# Patient Record
Sex: Female | Born: 1955
Health system: Southern US, Community
[De-identification: ages and names within clinical notes are randomized; demographics above are authoritative.]

## PROBLEM LIST (undated history)

## (undated) DIAGNOSIS — A6000 Herpesviral infection of urogenital system, unspecified: Secondary | ICD-10-CM

## (undated) DIAGNOSIS — F411 Generalized anxiety disorder: Secondary | ICD-10-CM

## (undated) DIAGNOSIS — F329 Major depressive disorder, single episode, unspecified: Secondary | ICD-10-CM

## (undated) DIAGNOSIS — M1990 Unspecified osteoarthritis, unspecified site: Secondary | ICD-10-CM

## (undated) DIAGNOSIS — K589 Irritable bowel syndrome without diarrhea: Secondary | ICD-10-CM

## (undated) DIAGNOSIS — E785 Hyperlipidemia, unspecified: Secondary | ICD-10-CM

## (undated) DIAGNOSIS — F1021 Alcohol dependence, in remission: Secondary | ICD-10-CM

## (undated) DIAGNOSIS — E7800 Pure hypercholesterolemia, unspecified: Secondary | ICD-10-CM

## (undated) DIAGNOSIS — F1110 Opioid abuse, uncomplicated: Secondary | ICD-10-CM

## (undated) DIAGNOSIS — B001 Herpesviral vesicular dermatitis: Secondary | ICD-10-CM

## (undated) DIAGNOSIS — F419 Anxiety disorder, unspecified: Secondary | ICD-10-CM

## (undated) DIAGNOSIS — N879 Dysplasia of cervix uteri, unspecified: Secondary | ICD-10-CM

## (undated) DIAGNOSIS — K6389 Other specified diseases of intestine: Secondary | ICD-10-CM

## (undated) DIAGNOSIS — I7300 Raynaud's syndrome without gangrene: Secondary | ICD-10-CM

## (undated) DIAGNOSIS — T40601A Poisoning by unspecified narcotics, accidental (unintentional), initial encounter: Secondary | ICD-10-CM

## (undated) DIAGNOSIS — M8580 Other specified disorders of bone density and structure, unspecified site: Secondary | ICD-10-CM

## (undated) DIAGNOSIS — G4730 Sleep apnea, unspecified: Secondary | ICD-10-CM

## (undated) HISTORY — PX: COSMETIC SURGERY: SHX468

## (undated) HISTORY — DX: Herpesviral infection of urogenital system, unspecified: A60.00

## (undated) HISTORY — DX: Dysplasia of cervix uteri, unspecified: N87.9

## (undated) HISTORY — DX: Other specified diseases of intestine: K63.89

## (undated) HISTORY — DX: Herpesviral vesicular dermatitis: B00.1

## (undated) HISTORY — DX: Irritable bowel syndrome, unspecified: K58.9

## (undated) HISTORY — DX: Other specified disorders of bone density and structure, unspecified site: M85.80

## (undated) HISTORY — DX: Generalized anxiety disorder: F41.1

## (undated) HISTORY — DX: Major depressive disorder, single episode, unspecified: F32.9

## (undated) HISTORY — DX: Anxiety disorder, unspecified: F41.9

## (undated) HISTORY — PX: TONSILLECTOMY AND ADENOIDECTOMY: SUR1326

## (undated) HISTORY — PX: EYE SURGERY: SHX253

## (undated) HISTORY — PX: TUBAL LIGATION: SHX77

## (undated) HISTORY — DX: Opioid abuse, uncomplicated: F11.10

## (undated) HISTORY — PX: AUGMENTATION MAMMAPLASTY: SUR837

## (undated) HISTORY — DX: Alcohol dependence, in remission: F10.21

## (undated) HISTORY — PX: OOPHORECTOMY: SHX86

## (undated) HISTORY — PX: CATARACT EXTRACTION: SUR2

## (undated) HISTORY — DX: Poisoning by unspecified narcotics, accidental (unintentional), initial encounter: T40.601A

## (undated) HISTORY — DX: Hyperlipidemia, unspecified: E78.5

## (undated) HISTORY — DX: Pure hypercholesterolemia, unspecified: E78.00

## (undated) HISTORY — DX: Raynaud's syndrome without gangrene: I73.00

---

## 1988-11-02 DIAGNOSIS — N879 Dysplasia of cervix uteri, unspecified: Secondary | ICD-10-CM

## 1988-11-02 HISTORY — PX: GYNECOLOGIC CRYOSURGERY: SHX857

## 1988-11-02 HISTORY — DX: Dysplasia of cervix uteri, unspecified: N87.9

## 1999-12-11 ENCOUNTER — Other Ambulatory Visit: Admission: RE | Admit: 1999-12-11 | Discharge: 1999-12-11 | Payer: Self-pay | Admitting: Gynecology

## 2001-08-01 ENCOUNTER — Other Ambulatory Visit: Admission: RE | Admit: 2001-08-01 | Discharge: 2001-08-01 | Payer: Self-pay | Admitting: Gynecology

## 2002-09-06 ENCOUNTER — Other Ambulatory Visit: Admission: RE | Admit: 2002-09-06 | Discharge: 2002-09-06 | Payer: Self-pay | Admitting: Gynecology

## 2003-06-28 ENCOUNTER — Ambulatory Visit (HOSPITAL_COMMUNITY): Admission: RE | Admit: 2003-06-28 | Discharge: 2003-06-28 | Payer: Self-pay | Admitting: Family Medicine

## 2003-06-28 ENCOUNTER — Encounter: Payer: Self-pay | Admitting: Family Medicine

## 2003-10-30 ENCOUNTER — Other Ambulatory Visit: Admission: RE | Admit: 2003-10-30 | Discharge: 2003-10-30 | Payer: Self-pay | Admitting: Gynecology

## 2003-11-03 HISTORY — PX: VAGINAL HYSTERECTOMY: SUR661

## 2004-01-07 ENCOUNTER — Encounter (INDEPENDENT_AMBULATORY_CARE_PROVIDER_SITE_OTHER): Payer: Self-pay | Admitting: Specialist

## 2004-01-08 ENCOUNTER — Inpatient Hospital Stay (HOSPITAL_COMMUNITY): Admission: RE | Admit: 2004-01-08 | Discharge: 2004-01-09 | Payer: Self-pay | Admitting: Gynecology

## 2004-06-24 ENCOUNTER — Ambulatory Visit (HOSPITAL_COMMUNITY): Admission: RE | Admit: 2004-06-24 | Discharge: 2004-06-24 | Payer: Self-pay | Admitting: Family Medicine

## 2004-10-31 ENCOUNTER — Other Ambulatory Visit: Admission: RE | Admit: 2004-10-31 | Discharge: 2004-10-31 | Payer: Self-pay | Admitting: Gynecology

## 2004-11-02 HISTORY — PX: BREAST ENHANCEMENT SURGERY: SHX7

## 2005-04-07 ENCOUNTER — Ambulatory Visit: Payer: Self-pay | Admitting: Gastroenterology

## 2005-04-10 ENCOUNTER — Ambulatory Visit: Payer: Self-pay | Admitting: Gastroenterology

## 2005-04-24 ENCOUNTER — Ambulatory Visit: Payer: Self-pay | Admitting: Gastroenterology

## 2005-04-24 ENCOUNTER — Encounter (INDEPENDENT_AMBULATORY_CARE_PROVIDER_SITE_OTHER): Payer: Self-pay | Admitting: *Deleted

## 2005-04-24 HISTORY — PX: UPPER GASTROINTESTINAL ENDOSCOPY: SHX188

## 2005-11-09 ENCOUNTER — Other Ambulatory Visit: Admission: RE | Admit: 2005-11-09 | Discharge: 2005-11-09 | Payer: Self-pay | Admitting: Gynecology

## 2006-11-22 ENCOUNTER — Other Ambulatory Visit: Admission: RE | Admit: 2006-11-22 | Discharge: 2006-11-22 | Payer: Self-pay | Admitting: Gynecology

## 2007-04-25 ENCOUNTER — Ambulatory Visit: Payer: Self-pay | Admitting: Family Medicine

## 2007-05-13 ENCOUNTER — Ambulatory Visit: Payer: Self-pay | Admitting: Family Medicine

## 2007-11-08 ENCOUNTER — Ambulatory Visit: Payer: Self-pay | Admitting: Family Medicine

## 2007-12-07 ENCOUNTER — Other Ambulatory Visit: Admission: RE | Admit: 2007-12-07 | Discharge: 2007-12-07 | Payer: Self-pay | Admitting: Gynecology

## 2008-04-27 ENCOUNTER — Ambulatory Visit: Payer: Self-pay | Admitting: Family Medicine

## 2008-11-20 ENCOUNTER — Encounter: Admission: RE | Admit: 2008-11-20 | Discharge: 2008-11-20 | Payer: Self-pay | Admitting: Family Medicine

## 2008-11-20 ENCOUNTER — Ambulatory Visit: Payer: Self-pay | Admitting: Family Medicine

## 2008-11-28 ENCOUNTER — Ambulatory Visit: Payer: Self-pay | Admitting: Family Medicine

## 2008-12-13 ENCOUNTER — Other Ambulatory Visit: Admission: RE | Admit: 2008-12-13 | Discharge: 2008-12-13 | Payer: Self-pay | Admitting: Gynecology

## 2008-12-13 ENCOUNTER — Ambulatory Visit: Payer: Self-pay | Admitting: Gynecology

## 2008-12-13 ENCOUNTER — Encounter: Payer: Self-pay | Admitting: Gynecology

## 2009-05-29 ENCOUNTER — Ambulatory Visit: Payer: Self-pay | Admitting: Family Medicine

## 2009-12-18 ENCOUNTER — Ambulatory Visit: Payer: Self-pay | Admitting: Gynecology

## 2009-12-18 ENCOUNTER — Other Ambulatory Visit: Admission: RE | Admit: 2009-12-18 | Discharge: 2009-12-18 | Payer: Self-pay | Admitting: Gynecology

## 2010-02-26 ENCOUNTER — Ambulatory Visit: Payer: Self-pay | Admitting: Gynecology

## 2010-05-16 ENCOUNTER — Ambulatory Visit: Payer: Self-pay | Admitting: Family Medicine

## 2010-07-02 ENCOUNTER — Ambulatory Visit: Payer: Self-pay | Admitting: Family Medicine

## 2010-12-22 ENCOUNTER — Other Ambulatory Visit (HOSPITAL_COMMUNITY)
Admission: RE | Admit: 2010-12-22 | Discharge: 2010-12-22 | Disposition: A | Discharge status: Home / Self Care | Payer: 59 | Source: Ambulatory Visit | Attending: Gynecology | Admitting: Gynecology

## 2010-12-22 DIAGNOSIS — Z124 Encounter for screening for malignant neoplasm of cervix: Secondary | ICD-10-CM | POA: Insufficient documentation

## 2010-12-24 ENCOUNTER — Other Ambulatory Visit: Payer: Self-pay | Admitting: Gynecology

## 2010-12-24 ENCOUNTER — Encounter (INDEPENDENT_AMBULATORY_CARE_PROVIDER_SITE_OTHER): Payer: 59 | Admitting: Gynecology

## 2010-12-24 DIAGNOSIS — R809 Proteinuria, unspecified: Secondary | ICD-10-CM

## 2010-12-24 DIAGNOSIS — Z01419 Encounter for gynecological examination (general) (routine) without abnormal findings: Secondary | ICD-10-CM

## 2011-01-01 ENCOUNTER — Other Ambulatory Visit: Payer: Self-pay | Admitting: Dermatology

## 2011-02-19 ENCOUNTER — Ambulatory Visit (INDEPENDENT_AMBULATORY_CARE_PROVIDER_SITE_OTHER): Payer: 59 | Admitting: Family Medicine

## 2011-02-19 ENCOUNTER — Ambulatory Visit
Admission: RE | Admit: 2011-02-19 | Discharge: 2011-02-19 | Disposition: A | Discharge status: Home / Self Care | Payer: 59 | Source: Ambulatory Visit | Attending: Family Medicine | Admitting: Family Medicine

## 2011-02-19 ENCOUNTER — Other Ambulatory Visit: Payer: Self-pay | Admitting: Family Medicine

## 2011-02-19 DIAGNOSIS — H9319 Tinnitus, unspecified ear: Secondary | ICD-10-CM

## 2011-02-19 DIAGNOSIS — M545 Low back pain, unspecified: Secondary | ICD-10-CM

## 2011-02-19 DIAGNOSIS — M461 Sacroiliitis, not elsewhere classified: Secondary | ICD-10-CM

## 2011-03-20 NOTE — Op Note (Signed)
NAME:  Bethany [name], Bethany [name]                        ACCOUNT NO.:  [id number]   MEDICAL RECORD NO.:  [id number]                   PATIENT TYPE:  OBV   LOCATION:  9399                                 FACILITY:  [facility]   PHYSICIAN:  Timothy P. Fontaine, M.D.           DATE OF BIRTH:  [birth year]   DATE OF PROCEDURE:  01/07/2004  DATE OF DISCHARGE:                                 OPERATIVE REPORT   PREOPERATIVE DIAGNOSES:  1. Menorrhagia.  2. Adenomyosis suspect.   POSTOPERATIVE DIAGNOSES:  1. Menorrhagia.  2. Adenomyosis suspect.   PROCEDURES:  1. Total vaginal hysterectomy, bilateral salpingo-oophorectomy.  2. Rogelio Seen culdoplasty.   SURGEON:  Timothy P. Fontaine, M.D.   ASSISTANT:  Rande Brunt. Eda Paschal, M.D.   ANESTHESIA:  General endotracheal.   COMPLICATIONS:  None.   ESTIMATED BLOOD LOSS:  50 mL.   SPECIMENS:  Uterus, bilateral fallopian tubes, and bilateral ovaries.   FINDINGS:  Uterus symmetrically generous, no gross abnormalities.  Right and  left fallopian tubes grossly normal.  Right and left ovaries grossly normal.  Cul-de-sac grossly normal to limited inspection.   DESCRIPTION OF PROCEDURE:  The patient was taken to the operating room and  underwent general anesthesia, was placed in the dorsal lithotomy position,  received a perineal and vaginal preparation with Betadine solution.  Bladder  emptied with in-and-out Foley catheterization, EUA performed, and the  patient was draped in the usual fashion.  The cervix was visualized with a  weighted speculum grasped with a single-tooth tenaculum, and  circumferentially injected using an epinephrine-lidocaine mixture.  A total  of 8 mL was used.  The cervical mucosa was then circumferential incised and  the paracervical plane sharply developed.  The posterior cul-de-sac was then  sharply entered without difficulty, a long weighted speculum placed, and the  right and left uterosacral ligaments identified, clamped, cut, and  ligated  using 0 Vicryl suture, and tagged for future reference.  The parametrial  tissues were then developed, subsequently clamped, cut, and ligated using 0  Vicryl suture.  Initial attempts to enter the anterior cul-de-sac were  unsuccessful, and it was decided to deliver the uterus and then subsequently  under digital tenting, the peritoneal reflection was obvious and the  anterior cul-de-sac was entered.  The one area of morcellation was excised  to allow delivery of the uterus.  The right and left uterine-ovarian  pedicles were then clamped and cut and the uterus was delivered.  Subsequently a tail sponge was placed to pack the intestines from the  operative field.  The left ovary and fallopian tube were identified and the  left infundibulopelvic ligament vessel was crossclamped, the ovary and  fallopian tube excised, and subsequently the pedicle doubly ligated using 0  Vicryl suture in a simple stitch followed by suture ligature.  A similar  procedure was carried out on the other side.  A short weighted speculum was  then placed within the vagina  and the posterior vaginal cuff was run  uterosacral to uterosacral using 0 Vicryl suture in a running interlocking  stitch.  A 2-0 Vicryl McCall culdoplasty suture was then placed.  The bowel  packing was then removed, the vagina closed anterior to posterior using 0  Vicryl suture interrupted figure-of-eight stitch, and the McCall stitch was  subsequently tied.  A Foley catheter was then placed and clear yellow urine  was noted.  The patient placed in the supine position and awakened without  difficulty and taken to the recovery room in good condition, having  tolerated the procedure well.                                               Timothy P. Audie Box, M.D.    TPF/MEDQ  D:  01/07/2004  T:  01/07/2004  Job:  [id number]

## 2011-03-20 NOTE — Discharge Summary (Signed)
NAME:  Bethany [name], Bethany [name]                        ACCOUNT NO.:  [id number]   MEDICAL RECORD NO.:  [id number]                   PATIENT TYPE:  INP   LOCATION:  9303                                 [facility]ACILITY:  [facility]   PHYSICIAN:  Timothy P. Fontaine, M.D.           DATE OF BIRTH:  1955-11-14   DATE OF ADMISSION:  01/07/2004  DATE OF DISCHARGE:                                 DISCHARGE SUMMARY   DISCHARGE DIAGNOSES:  1. Menorrhagia.  2. Adenomyosis.  3. Postoperative nausea and vomiting, resolved.   PROCEDURE:  Total vaginal hysterectomy, bilateral salpingo-oophorectomy,  McCall's culdoplasty - January 07, 2004.  Pathology [id number]:  Benign cervix  with squamous metaplasia, benign secretory endometrium, adenomyosis,  bilateral ovaries and fallopian tubes with ovarian fibrous adhesions,  unilateral cystic ovarian follicle, benign fallopian tubes.   HOSPITAL COURSE:  The patient underwent an uncomplicated total vaginal  hysterectomy, bilateral salpingo-oophorectomy, McCall's culdoplasty on January 07, 2004.  The postoperative course was complicated by nausea and vomiting  felt secondary to the anesthesia, requiring an additional hospital day for  IV hydration and antiemetic therapy.  The morning of discharge the patient  was feeling well, taking oral fluids, voiding without difficulty, with  resolution of her nausea and vomiting.  Her preoperative hemoglobin was 8.9,  postoperative hemoglobin was 7.4.  The patient received precautions,  instructions, and follow-up.  Will return to the office in 2 weeks, and was  given a prescription for Tylox #15 for pain.                                               Timothy P. Audie Box, M.D.    TPF/MEDQ  D:  01/09/2004  T:  01/10/2004  Job:  [id number]

## 2011-03-20 NOTE — H&P (Signed)
NAME:  Bethany [name], Bethany [name]                        ACCOUNT NO.:  [id number]   MEDICAL RECORD NO.:  [id number]                   PATIENT TYPE:  AMB   LOCATION:  SDC                                  [facility]ILITY:  [facility]   PHYSICIAN:  Timothy P. Fontaine, M.D.           DATE OF BIRTH:  September 10, 1956   DATE OF ADMISSION:  02/07/2004  DATE OF DISCHARGE:                                HISTORY & PHYSICAL   CHIEF COMPLAINT:  Menorrhagia.   HISTORY OF PRESENT ILLNESS:  A 55 year old, G6, P2, AB4, female status post  laparoscopic tubal sterilization with a long history of increasing  menorrhagia. She has a history of iron-deficiency anemia with the last  outpatient hemoglobin of 9.4.  She has been on vitamin and iron  supplementation along with multivitamins. Her evaluation included ultrasound  which shows a generous uterine size of 10 cm with some changes throughout  the myometrium with questionable adenomyosis. She also has small leiomyomata  in the fundus. Both ovaries were visualized and grossly normal. The patient  is admitted at this time for total vaginal hysterectomy, bilateral salpingo-  oophorectomy due to her persistent, recurrent menorrhagia.   PAST MEDICAL HISTORY:  Anxiety.   PAST SURGICAL HISTORY:  1. Breast augmentation times three.  2. Laparoscopic tubal sterilization.  3. Marsupialization of left Bartholin gland.  4. Vaginal delivery times two.   MEDICATIONS:  1. Wellbutrin XR 300 mg.  2. Lexapro 10 mg.  3. Multivitamin.   SOCIAL HISTORY:  Significant for cigarette use.   FAMILY HISTORY:  Noncontributory.   REVIEW OF SYSTEMS:  Noncontributory.   ADMISSION PHYSICAL EXAMINATION:  VITAL SIGNS: Afebrile, vital signs are  stable.  HEENT: Normal.  LUNGS: Clear.  CARDIAC: Regular rate without murmurs, rubs, or gallops.  ABDOMEN: Benign.  PELVIC EXAM: External, BUS, vagina normal. Cervix normal. Uterus  retroverted, somewhat globoid. Grossly normal in size. Adnexa without  masses  or tenderness.   ASSESSMENT:  A 55 year old, G6, P2, AB4, female with menorrhagia, iron-  deficiency anemia. Ultrasound suggestive of adenomyosis, small myoma for a  total vaginal hysterectomy, bilateral salpingo-oophorectomy. The risks,  benefits, indications, and alternatives of the procedure were reviewed with  her to include the expected intraoperative and postoperative courses.  Ovarian conservation issue was discussed with her and options of keeping one  or both ovaries versus removing them were discussed and the patient wants  both ovaries removed. I reviewed with her the risk of hypoestrogenism to  include long-term issues and the risks of acute symptoms, hot flashes, sleep  disturbances associated with the removal of her ovaries. The possibilities  for need for estrogen replacement therapy postoperative was discussed and  although tentatively will withhold estrogen replacement therapy and see how  she responds. She understands that she may need estrogen replacement therapy  for symptom relief and the issues and risks of estrogen replacement therapy  to include the risk in the WHI study were discussed with  her in detail. The  risks of breast cancer as well as cardiovascular, stroke,  heart attack, and  DVT were all reviewed with her. She does smoke and the increased risk  associated with this was reviewed, understood, and accepted, and she wants  both ovaries removed. I also discussed with her if we are not able to  achieve this vaginally then we would consider laparoscopic assistance, and  she understands and accepts the possibility for laparoscopic assistance with  the additional incisions. I also reviewed with her that if any time during  the procedure it becomes unsafe to proceed vaginally or if any complications  arises that we may need to switch to an exploratory laparotomy and she  understands and accepts this possibility. Sexuality following hysterectomy  was also  reviewed with her and the long-term risks of orgasmic dysfunction  as well as persistent dyspareunia was discussed, understood, and accepted.  The absolute irreversible sterility associated with hysterectomy was also  reviewed with her. The risks of transfusion was reviewed with her and we  will obtain another CBC preoperatively. She was low in the 9 range, although  she has been on the iron and the realistic risk for transfusion was  discussed with her. The risks of transfusion reviewed, she understands, and  accepts these possibilities. The risks of infection, both internal requiring  prolonged antibiotics, abscess formation requiring reoperation, drainage of  abscess as well as if abdominal incisions are made, the risks of wound  complications requiring opening and draining of incisions and closure by  secondary intention was all discussed, understood, and accepted. The risk of  inadvertent injury to internal organs including bowel, bladder, ureters,  vessels, and nerves requiring more extensive reparative surgeries and future  reparative surgeries including ostomy formation was all discussed,  understood, and accepted.                                               Timothy P. Audie Box, M.D.    TPF/MEDQ  D:  12/28/2003  T:  12/28/2003  Job:  [id number]

## 2011-06-16 ENCOUNTER — Encounter: Payer: Self-pay | Admitting: Family Medicine

## 2011-06-17 ENCOUNTER — Encounter: Payer: Self-pay | Admitting: Family Medicine

## 2011-06-17 ENCOUNTER — Ambulatory Visit (INDEPENDENT_AMBULATORY_CARE_PROVIDER_SITE_OTHER): Payer: 59 | Admitting: Family Medicine

## 2011-06-17 VITALS — BP 116/70 | HR 60 | Ht 65.0 in | Wt 123.0 lb

## 2011-06-17 DIAGNOSIS — E785 Hyperlipidemia, unspecified: Secondary | ICD-10-CM

## 2011-06-17 DIAGNOSIS — M722 Plantar fascial fibromatosis: Secondary | ICD-10-CM

## 2011-06-17 DIAGNOSIS — I7300 Raynaud's syndrome without gangrene: Secondary | ICD-10-CM

## 2011-06-17 DIAGNOSIS — M549 Dorsalgia, unspecified: Secondary | ICD-10-CM

## 2011-06-17 DIAGNOSIS — B001 Herpesviral vesicular dermatitis: Secondary | ICD-10-CM

## 2011-06-17 DIAGNOSIS — Z79899 Other long term (current) drug therapy: Secondary | ICD-10-CM

## 2011-06-17 DIAGNOSIS — Z0000 Encounter for general adult medical examination without abnormal findings: Secondary | ICD-10-CM

## 2011-06-17 DIAGNOSIS — B009 Herpesviral infection, unspecified: Secondary | ICD-10-CM

## 2011-06-17 HISTORY — DX: Raynaud's syndrome without gangrene: I73.00

## 2011-06-17 HISTORY — DX: Hyperlipidemia, unspecified: E78.5

## 2011-06-17 LAB — COMPREHENSIVE METABOLIC PANEL
ALT: 20 U/L (ref 0–35)
AST: 37 U/L (ref 0–37)
Alkaline Phosphatase: 36 U/L — ABNORMAL LOW (ref 39–117)
BUN: 10 mg/dL (ref 6–23)
Calcium: 9.3 mg/dL (ref 8.4–10.5)
Creat: 0.93 mg/dL (ref 0.50–1.10)
Total Bilirubin: 0.5 mg/dL (ref 0.3–1.2)

## 2011-06-17 LAB — POCT URINALYSIS DIPSTICK
Ketones, UA: NEGATIVE
Leukocytes, UA: NEGATIVE
Protein, UA: NEGATIVE
Spec Grav, UA: 1.02
Urobilinogen, UA: NEGATIVE
pH, UA: 7

## 2011-06-17 LAB — LIPID PANEL
HDL: 93 mg/dL (ref >39)
LDL Cholesterol: 87 mg/dL (ref 0–99)
Total CHOL/HDL Ratio: 2.1 Ratio
VLDL: 14 mg/dL (ref 0–40)

## 2011-06-17 LAB — CBC WITH DIFFERENTIAL/PLATELET
Basophils Absolute: 0 10*3/uL (ref 0.0–0.1)
Basophils Relative: 1 % (ref 0–1)
Eosinophils Absolute: 0.1 10*3/uL (ref 0.0–0.7)
Eosinophils Relative: 1 % (ref 0–5)
HCT: 38.3 % (ref 36.0–46.0)
Lymphocytes Relative: 35 % (ref 12–46)
MCHC: 33.2 g/dL (ref 30.0–36.0)
MCV: 101.6 fL — ABNORMAL HIGH (ref 78.0–100.0)
Monocytes Absolute: 0.3 10*3/uL (ref 0.1–1.0)
Platelets: 178 10*3/uL (ref 150–400)
RDW: 13.4 % (ref 11.5–15.5)
WBC: 4.1 10*3/uL (ref 4.0–10.5)

## 2011-06-17 NOTE — Patient Instructions (Signed)
We will give you the name of a chiropractor. Use heel cups and do stretching for the plantar fasciitis. If that doesn't work try arch supports.

## 2011-06-17 NOTE — Progress Notes (Signed)
  Subjective:    Patient ID: Bethany [name], female    DOB: Apr 27, 1956, 55 y.o.   MRN: [id number]  HPI She is here for a complete examination. She is routine followup with her psychiatrist as well as her gynecologist. She continues to have some difficulty with her back pain that bothers her with certain activities but she is able to play golf and run. She is also had some heel discomfort. She has an underlying history of Raynaud's disease and is controlling this quite well. She does need refill on several of her medications. Her herpes has not caused her any difficulty. Her social and family history were reviewed and are in the record. Review of Systems Negative except as above    Objective:   Physical Exam BP 116/70  Pulse 60  Ht 5\' 5"  (1.651 m)  Wt 123 lb (55.792 kg)  BMI 20.47 kg/m2  General Appearance:    Alert, cooperative, no distress, appears stated age  Head:    Normocephalic, without obvious abnormality, atraumatic  Eyes:    PERRL, conjunctiva/corneas clear, EOM's intact, fundi    benign  Ears:    Normal TM's and external ear canals  Nose:   Nares normal, mucosa normal, no drainage or sinus   tenderness  Throat:   Lips, mucosa, and tongue normal; teeth and gums normal  Neck:   Supple, no lymphadenopathy;  thyroid:  no   enlargement/tenderness/nodules; no carotid   bruit or JVD  Back:    Spine nontender, no curvature, ROM normal, no CVA     tenderness  Lungs:     Clear to auscultation bilaterally without wheezes, rales or     ronchi; respirations unlabored  Chest Wall:    No tenderness or deformity   Heart: Hyperlipidemia    Regular rate and rhythm, S1 and S2 normal, no murmur, rub   or gallop  Breast Exam:    Deferred to GYN  Abdomen:     Soft, non-tender, nondistended, normoactive bowel sounds,    no masses, no hepatosplenomegaly  Genitalia:    Deferred to GYN     Extremities:   No clubbing, cyanosis or edema.tender to palpation over the calcaneal spur. Motion of her  back.   Pulses:   2+ and symmetric all extremities  Skin:   Skin color, texture, turgor normal, no rashes or lesions  Lymph nodes:   Cervical, supraclavicular, and axillary nodes normal  Neurologic:   CNII-XII intact, normal strength, sensation and gait; reflexes 2+ and symmetric throughout          Psych:   Normal mood, affect, hygiene and grooming.         Assessment & Plan:  Raynaud's disease. Migraine headaches. Herpes labialis. Hyperlipidemia Routine blood screening. We'll also refer for chiropractic help concerning her back pain.

## 2011-06-18 ENCOUNTER — Telehealth: Payer: Self-pay

## 2011-06-18 MED ORDER — SIMVASTATIN 80 MG PO TABS: 80.0000 mg | ORAL_TABLET | Freq: Every day | ORAL | Status: DC

## 2011-06-18 MED ORDER — VALACYCLOVIR HCL 1 G PO TABS: 1000.0000 mg | ORAL_TABLET | Freq: Two times a day (BID) | ORAL | Status: DC

## 2011-06-18 MED ORDER — HYDROCORTISONE 2.5 % RE CREA: TOPICAL_CREAM | Freq: Two times a day (BID) | RECTAL | Status: AC

## 2011-06-18 NOTE — Telephone Encounter (Signed)
Called pt to let her know labs look good

## 2011-06-29 ENCOUNTER — Other Ambulatory Visit: Payer: Self-pay | Admitting: *Deleted

## 2011-06-29 DIAGNOSIS — R921 Mammographic calcification found on diagnostic imaging of breast: Secondary | ICD-10-CM

## 2011-06-30 ENCOUNTER — Encounter: Payer: Self-pay | Admitting: Gynecology

## 2011-07-01 ENCOUNTER — Other Ambulatory Visit: Payer: Self-pay | Admitting: Gynecology

## 2011-07-01 DIAGNOSIS — R921 Mammographic calcification found on diagnostic imaging of breast: Secondary | ICD-10-CM

## 2011-07-07 ENCOUNTER — Other Ambulatory Visit: Payer: Self-pay | Admitting: *Deleted

## 2011-07-07 DIAGNOSIS — G4700 Insomnia, unspecified: Secondary | ICD-10-CM

## 2011-07-07 MED ORDER — ZOLPIDEM TARTRATE 10 MG PO TABS: 10.0000 mg | ORAL_TABLET | Freq: Every evening | ORAL | Status: AC | PRN

## 2011-07-07 NOTE — Telephone Encounter (Signed)
rx called into pharmacy

## 2011-07-07 NOTE — Telephone Encounter (Signed)
Pharmacy faxed over refill request.please advise

## 2011-07-16 ENCOUNTER — Other Ambulatory Visit: Payer: Self-pay | Admitting: Family Medicine

## 2011-07-16 MED ORDER — SCOPOLAMINE 1 MG/3DAYS TD PT72: 1.0000 | MEDICATED_PATCH | TRANSDERMAL | Status: AC

## 2011-09-28 ENCOUNTER — Telehealth: Payer: Self-pay | Admitting: Family Medicine

## 2011-09-28 NOTE — Telephone Encounter (Signed)
Pt will discuss this on Monday Dec 3 @ 10*15am with Dr. Susann Givens

## 2011-09-28 NOTE — Telephone Encounter (Signed)
Pt called and states she wants referral for MRI her back is getting worse.

## 2011-09-28 NOTE — Telephone Encounter (Signed)
Her set up an appointment as we can discuss this further. Let her know that it is not necessarily a simple past order an MRI

## 2011-10-05 ENCOUNTER — Encounter: Payer: Self-pay | Admitting: Family Medicine

## 2011-10-05 ENCOUNTER — Ambulatory Visit (INDEPENDENT_AMBULATORY_CARE_PROVIDER_SITE_OTHER): Payer: 59 | Admitting: Family Medicine

## 2011-10-05 VITALS — BP 110/66 | HR 58 | Wt 126.0 lb

## 2011-10-05 DIAGNOSIS — M549 Dorsalgia, unspecified: Secondary | ICD-10-CM

## 2011-10-05 NOTE — Progress Notes (Signed)
  Subjective:    Patient ID: Bethany [name], female    DOB: 04/21/1956, 55 y.o.   MRN: [id number]  HPI She is here for consult concerning a several year history of back pain. It is mainly in the low back on the right. The pain is made worse sometimes with sitting and in the past had gone away relatively quickly. The pain is now radiating into her right upper thigh area. She has fallen several times do to the right thigh pain stating she has difficulty occasionally lifting her leg. She has been involved in physical therapy as well as chiropractic manipulation neither which has helped for very long. She is taking Motrin 800 mg 3 times a day. She recently had LS-spine films which were apparently negative.   Review of Systems     Objective:   Physical Exam Slight tenderness over the right SI joint with Pearlean Brownie test being positive but stork test negative. Negative straight leg raising with good DTRs and strength. Hip motion.       Assessment & Plan:  Worsening low back pain. An MRI will be ordered.

## 2011-10-08 ENCOUNTER — Ambulatory Visit
Admission: RE | Admit: 2011-10-08 | Discharge: 2011-10-08 | Disposition: A | Discharge status: Home / Self Care | Payer: 59 | Source: Ambulatory Visit | Attending: Family Medicine | Admitting: Family Medicine

## 2011-10-08 DIAGNOSIS — M549 Dorsalgia, unspecified: Secondary | ICD-10-CM

## 2011-12-15 ENCOUNTER — Other Ambulatory Visit: Payer: Self-pay | Admitting: *Deleted

## 2011-12-15 DIAGNOSIS — R921 Mammographic calcification found on diagnostic imaging of breast: Secondary | ICD-10-CM

## 2011-12-21 LAB — HM MAMMOGRAPHY

## 2011-12-22 ENCOUNTER — Other Ambulatory Visit: Payer: Self-pay | Admitting: Gynecology

## 2011-12-22 DIAGNOSIS — R921 Mammographic calcification found on diagnostic imaging of breast: Secondary | ICD-10-CM

## 2011-12-28 ENCOUNTER — Encounter: Payer: Self-pay | Admitting: *Deleted

## 2011-12-29 ENCOUNTER — Encounter: Payer: Self-pay | Admitting: Gynecology

## 2011-12-29 ENCOUNTER — Ambulatory Visit (INDEPENDENT_AMBULATORY_CARE_PROVIDER_SITE_OTHER): Payer: 59 | Admitting: Gynecology

## 2011-12-29 VITALS — BP 110/70 | Ht 66.0 in | Wt 130.0 lb

## 2011-12-29 DIAGNOSIS — Z01419 Encounter for gynecological examination (general) (routine) without abnormal findings: Secondary | ICD-10-CM

## 2011-12-29 DIAGNOSIS — M899 Disorder of bone, unspecified: Secondary | ICD-10-CM

## 2011-12-29 DIAGNOSIS — E7800 Pure hypercholesterolemia, unspecified: Secondary | ICD-10-CM | POA: Insufficient documentation

## 2011-12-29 DIAGNOSIS — M8580 Other specified disorders of bone density and structure, unspecified site: Secondary | ICD-10-CM

## 2011-12-29 DIAGNOSIS — F329 Major depressive disorder, single episode, unspecified: Secondary | ICD-10-CM | POA: Insufficient documentation

## 2011-12-29 DIAGNOSIS — M949 Disorder of cartilage, unspecified: Secondary | ICD-10-CM

## 2011-12-29 DIAGNOSIS — Z79890 Hormone replacement therapy (postmenopausal): Secondary | ICD-10-CM

## 2011-12-29 DIAGNOSIS — F39 Unspecified mood [affective] disorder: Secondary | ICD-10-CM | POA: Insufficient documentation

## 2011-12-29 LAB — URINALYSIS W MICROSCOPIC + REFLEX CULTURE
Bilirubin Urine: NEGATIVE
Glucose, UA: NEGATIVE mg/dL
Leukocytes, UA: NEGATIVE
Protein, ur: 30 mg/dL — AB
RBC / HPF: NONE SEEN RBC/hpf (ref <3)
Specific Gravity, Urine: >1.03 — ABNORMAL HIGH (ref 1.005–1.030)
Urobilinogen, UA: 1 mg/dL (ref 0.0–1.0)

## 2011-12-29 MED ORDER — ESTRADIOL 0.1 MG/24HR TD PTTW: 1.0000 | MEDICATED_PATCH | TRANSDERMAL | Status: DC

## 2011-12-29 NOTE — Progress Notes (Signed)
Bethany [name] [birth year] [id number]        56 y.o.  for annual exam.  Doing well on estradiol 0.1 mg patches. Asked about switching to oral due to expense and patch residue.  Past medical history,surgical history, medications, allergies, family history and social history were all reviewed and documented in the EPIC chart. ROS:  Was performed and pertinent positives and negatives are included in the history.  Exam: Amy chaperone present Filed Vitals:   12/29/11 0901  BP: 110/70   General appearance  Normal Skin grossly normal Head/Neck normal with no cervical or supraclavicular adenopathy thyroid normal Lungs  clear Cardiac RR, without RMG Abdominal  soft, nontender, without masses, organomegaly or hernia Breasts  examined lying and sitting without masses, retractions, discharge or axillary adenopathy.  Bilateral implants noted. Pelvic  Ext/BUS/vagina  normal   Adnexa  Without masses or tenderness    Anus and perineum  normal   Rectovaginal  normal sphincter tone without palpated masses or tenderness.    Assessment/Plan:  56 y.o. female for annual exam.   Status post TVH BSO. 1. ERT. I reviewed the issues of ERT and again the WHI study with increased risk of stroke heart attack DVT possible breast cancer risk. She wants to continue. I suggested she try minivelle 0.1 mg. I gave her two-week sample and a discount card we'll see how she does with this. I discussed the first pass effect benefit of transdermal and at this point hopefully she'll do well with this and stay with it. If not she'll call and she'll switch to oral understanding the first pass effect. 2. Osteopenia. DEXA April 2011 showed osteopenia -1.5. FRAX 10 year probability of fracture major osteoporotic at 5.7% and hip fracture at 0.5%. Within a repeat DEXA now at a two-year interval we'll go from there. Increase calcium vitamin D reviewed. 3. Breast health. Just had mammogram was recommended repeat in 6 months for stability  she will follow up for that. SBE monthly reviewed. 4. Pap smear. No Pap smear was done today. Her last Pap smear was 2012. She has no history of abnormal Pap smears with numerous normal reports in her chart. I discussed current screening guidelines and the options of stopping altogether as she is status post hysterectomy for benign indications versus less frequent interval was discussed and we'll revisit this on an annual basis. 5. Colonoscopy. Patient had a colonoscopy 2006.  I gave her OC home test kit to check her stool for blood and she has to mail this back and call us a week afterwards to make sure that we received it and that it was negative. 6. Health maintenance. No blood work was done today was all done through her primary physician's office. She will see me in a year, sooner as needed.    Dara Lords MD, 9:42 AM 12/29/2011

## 2011-12-29 NOTE — Patient Instructions (Signed)
Follow up for DEXA. Mail in the stool blood kit and call us to make sure that we received and it was negative. Call me if you have any problem with the new hormone patch.

## 2012-03-17 ENCOUNTER — Ambulatory Visit (INDEPENDENT_AMBULATORY_CARE_PROVIDER_SITE_OTHER): Payer: 59

## 2012-03-17 ENCOUNTER — Encounter: Payer: Self-pay | Admitting: Gynecology

## 2012-03-17 DIAGNOSIS — M899 Disorder of bone, unspecified: Secondary | ICD-10-CM

## 2012-03-17 DIAGNOSIS — M8580 Other specified disorders of bone density and structure, unspecified site: Secondary | ICD-10-CM

## 2012-03-23 ENCOUNTER — Encounter: Payer: Self-pay | Admitting: Gynecology

## 2012-06-08 ENCOUNTER — Encounter: Payer: Self-pay | Admitting: Internal Medicine

## 2012-06-15 ENCOUNTER — Ambulatory Visit (INDEPENDENT_AMBULATORY_CARE_PROVIDER_SITE_OTHER): Payer: 59 | Admitting: Family Medicine

## 2012-06-15 ENCOUNTER — Other Ambulatory Visit: Payer: Self-pay | Admitting: *Deleted

## 2012-06-15 ENCOUNTER — Encounter: Payer: Self-pay | Admitting: Family Medicine

## 2012-06-15 VITALS — BP 110/70 | HR 63 | Ht 65.0 in | Wt 126.0 lb

## 2012-06-15 DIAGNOSIS — I7300 Raynaud's syndrome without gangrene: Secondary | ICD-10-CM

## 2012-06-15 DIAGNOSIS — R921 Mammographic calcification found on diagnostic imaging of breast: Secondary | ICD-10-CM

## 2012-06-15 DIAGNOSIS — M949 Disorder of cartilage, unspecified: Secondary | ICD-10-CM

## 2012-06-15 DIAGNOSIS — E7800 Pure hypercholesterolemia, unspecified: Secondary | ICD-10-CM

## 2012-06-15 DIAGNOSIS — M899 Disorder of bone, unspecified: Secondary | ICD-10-CM

## 2012-06-15 DIAGNOSIS — Z0000 Encounter for general adult medical examination without abnormal findings: Secondary | ICD-10-CM

## 2012-06-15 DIAGNOSIS — L259 Unspecified contact dermatitis, unspecified cause: Secondary | ICD-10-CM

## 2012-06-15 DIAGNOSIS — B001 Herpesviral vesicular dermatitis: Secondary | ICD-10-CM

## 2012-06-15 DIAGNOSIS — M8580 Other specified disorders of bone density and structure, unspecified site: Secondary | ICD-10-CM

## 2012-06-15 DIAGNOSIS — B009 Herpesviral infection, unspecified: Secondary | ICD-10-CM

## 2012-06-15 LAB — CBC WITH DIFFERENTIAL/PLATELET
Basophils Absolute: 0 10*3/uL (ref 0.0–0.1)
Eosinophils Relative: 3 % (ref 0–5)
HCT: 40.1 % (ref 36.0–46.0)
Hemoglobin: 13.4 g/dL (ref 12.0–15.0)
Lymphocytes Relative: 30 % (ref 12–46)
MCHC: 33.4 g/dL (ref 30.0–36.0)
MCV: 98 fL (ref 78.0–100.0)
Monocytes Absolute: 0.4 10*3/uL (ref 0.1–1.0)
Monocytes Relative: 9 % (ref 3–12)
Neutro Abs: 2.8 10*3/uL (ref 1.7–7.7)
RDW: 13.5 % (ref 11.5–15.5)
WBC: 4.8 10*3/uL (ref 4.0–10.5)

## 2012-06-15 LAB — COMPREHENSIVE METABOLIC PANEL
Albumin: 4.4 g/dL (ref 3.5–5.2)
BUN: 10 mg/dL (ref 6–23)
Calcium: 9.3 mg/dL (ref 8.4–10.5)
Chloride: 101 mEq/L (ref 96–112)
Creat: 0.9 mg/dL (ref 0.50–1.10)
Glucose, Bld: 75 mg/dL (ref 70–99)
Potassium: 4.1 mEq/L (ref 3.5–5.3)

## 2012-06-15 LAB — LIPID PANEL: Cholesterol: 217 mg/dL — ABNORMAL HIGH (ref 0–200)

## 2012-06-15 NOTE — Progress Notes (Signed)
Subjective:    Patient ID: Bethany [name], female    DOB: June 13, 1956, 56 y.o.   MRN: [id number]  HPI She is here for a complete examination. She continues to be followed by her psychiatrist and is doing well on her Lexapro and Neurontin. She does occasionally his Valtrex for herpes. She has a history of osteopenia and has had a recent DEXA. She does have a lesion on her right forearm she would like me to look at. She does have difficulty with Raynaud's disease usually in the winter months. Review of past history indicates that she is up-to-date on most of her routine medical needs. She does have a previous history of smoking. Her work and home life are going quite well. She remains alcohol free.   Review of Systems  Constitutional: Negative.   HENT: Negative.   Respiratory: Negative.   Cardiovascular: Negative.   Gastrointestinal: Negative.   Genitourinary: Negative.   Musculoskeletal: Negative.   Skin: Positive for rash.  Neurological: Negative.   Hematological: Negative.   Psychiatric/Behavioral: Negative.        Objective:   Physical Exam BP 110/70  Pulse 63  Ht 5\' 5"  (1.651 m)  Wt 126 lb (57.153 kg)  BMI 20.97 kg/m2  General Appearance:    Alert, cooperative, no distress, appears stated age  Head:    Normocephalic, without obvious abnormality, atraumatic  Eyes:    PERRL, conjunctiva/corneas clear, EOM's intact, fundi    benign  Ears:    Normal TM's and external ear canals  Nose:   Nares normal, mucosa normal, no drainage or sinus   tenderness  Throat:   Lips, mucosa, and tongue normal; teeth and gums normal  Neck:   Supple, no lymphadenopathy;  thyroid:  no   enlargement/tenderness/nodules; no carotid   bruit or JVD  Back:    Spine nontender, no curvature, ROM normal, no CVA     tenderness  Lungs:     Clear to auscultation bilaterally without wheezes, rales or     ronchi; respirations unlabored  Chest Wall:    No tenderness or deformity   Heart:    Regular rate and  rhythm, S1 and S2 normal, no murmur, rub   or gallop  Breast Exam:    Deferred to GYN  Abdomen:     Soft, non-tender, nondistended, normoactive bowel sounds,    no masses, no hepatosplenomegaly  Genitalia:    Deferred to GYN     Extremities:   No clubbing, cyanosis or edema  Pulses:   2+ and symmetric all extremities  Skin:   Skin color, texture, turgor normal, one by 2 cm fascicular lesion noted on the right midforearm   Lymph nodes:   Cervical, supraclavicular, and axillary nodes normal  Neurologic:   CNII-XII intact, normal strength, sensation and gait; reflexes 2+ and symmetric throughout          Psych:   Normal mood, affect, hygiene and grooming.           Assessment & Plan:   1. Hypercholesteremia  Lipid panel  2. Routine general medical examination at a health care facility  PR ELECTROCARDIOGRAM, COMPLETE, CBC with Differential, Comprehensive metabolic panel, Lipid panel, Vitamin D 25 hydroxy  3. Herpes labialis    4. Osteopenia  Vitamin D 25 hydroxy  5. Raynaud's disease    6. Contact dermatitis     conservative care for the contact dermatitis. Continue on all of her other medications. Discussed the possibility of using a channel  blocker to help with the renewed. She will discuss this with her husband who is a Teacher, early years/pre and also discussed possible CT scan and recommend that she check with her insurance to see if this will cover any one since the criteria are new for previous smokers.

## 2012-06-16 LAB — VITAMIN D 25 HYDROXY (VIT D DEFICIENCY, FRACTURES): Vit D, 25-Hydroxy: 70 ng/mL (ref 30–89)

## 2012-06-17 ENCOUNTER — Encounter: Payer: 59 | Admitting: Family Medicine

## 2012-06-20 LAB — HM MAMMOGRAPHY

## 2012-06-21 ENCOUNTER — Other Ambulatory Visit: Payer: Self-pay | Admitting: Gynecology

## 2012-06-21 DIAGNOSIS — R921 Mammographic calcification found on diagnostic imaging of breast: Secondary | ICD-10-CM

## 2012-07-26 ENCOUNTER — Other Ambulatory Visit: Payer: Self-pay | Admitting: Family Medicine

## 2012-07-26 NOTE — Telephone Encounter (Signed)
IS THIS OKAY TO REFILL 

## 2012-10-03 ENCOUNTER — Other Ambulatory Visit: Payer: Self-pay

## 2012-10-03 ENCOUNTER — Telehealth: Payer: Self-pay | Admitting: Internal Medicine

## 2012-10-03 MED ORDER — IBUPROFEN 800 MG PO TABS: 800.0000 mg | ORAL_TABLET | Freq: Three times a day (TID) | ORAL | Status: DC | PRN

## 2012-10-03 NOTE — Telephone Encounter (Signed)
Called med in 

## 2012-10-03 NOTE — Telephone Encounter (Signed)
Dr.LAlonde is this ok

## 2012-12-06 ENCOUNTER — Other Ambulatory Visit: Payer: Self-pay | Admitting: Dermatology

## 2012-12-21 ENCOUNTER — Other Ambulatory Visit: Payer: Self-pay | Admitting: *Deleted

## 2012-12-21 DIAGNOSIS — R921 Mammographic calcification found on diagnostic imaging of breast: Secondary | ICD-10-CM

## 2012-12-28 ENCOUNTER — Other Ambulatory Visit: Payer: Self-pay | Admitting: Gynecology

## 2013-01-27 ENCOUNTER — Ambulatory Visit (INDEPENDENT_AMBULATORY_CARE_PROVIDER_SITE_OTHER): Payer: 59 | Admitting: Gynecology

## 2013-01-27 ENCOUNTER — Encounter: Payer: Self-pay | Admitting: Gynecology

## 2013-01-27 VITALS — BP 112/68 | Ht 65.0 in | Wt 131.0 lb

## 2013-01-27 DIAGNOSIS — R197 Diarrhea, unspecified: Secondary | ICD-10-CM

## 2013-01-27 DIAGNOSIS — Z01419 Encounter for gynecological examination (general) (routine) without abnormal findings: Secondary | ICD-10-CM

## 2013-01-27 DIAGNOSIS — M8580 Other specified disorders of bone density and structure, unspecified site: Secondary | ICD-10-CM

## 2013-01-27 DIAGNOSIS — M949 Disorder of cartilage, unspecified: Secondary | ICD-10-CM

## 2013-01-27 DIAGNOSIS — M899 Disorder of bone, unspecified: Secondary | ICD-10-CM

## 2013-01-27 DIAGNOSIS — Z79890 Hormone replacement therapy (postmenopausal): Secondary | ICD-10-CM

## 2013-01-27 MED ORDER — MINIVELLE 0.1 MG/24HR TD PTTW: MEDICATED_PATCH | TRANSDERMAL | Status: DC

## 2013-01-27 NOTE — Patient Instructions (Signed)
Call to Schedule your mammogram  Facilities in [place]: 1)  [facility], [address] Phone: [phone] 2)  [facility], [address] Phone: [phone] 3)  Dr. Bertrand at [facility]  [address] Phone: [phone]     Mammogram A mammogram is an X-ray test to find changes in a woman's breast. You should get a mammogram if:  You are 57 years of age or older  You have risk factors.   Your doctor recommends that you have one.  BEFORE THE TEST  Do not schedule the test the week before your period, especially if your breasts are sore during this time.  On the day of your mammogram:  Wash your breasts and armpits well. After washing, do not put on any deodorant or talcum powder on until after your test.   Eat and drink as you usually do.   Take your medicines as usual.   If you are diabetic and take insulin, make sure you:   Eat before coming for your test.   Take your insulin as usual.   If you cannot keep your appointment, call before the appointment to cancel. Schedule another appointment.  TEST  You will need to undress from the waist up. You will put on a hospital gown.   Your breast will be put on the mammogram machine, and it will press firmly on your breast with a piece of plastic called a compression paddle. This will make your breast flatter so that the machine can X-ray all parts of your breast.   Both breasts will be X-rayed. Each breast will be X-rayed from above and from the side. An X-ray might need to be taken again if the picture is not good enough.   The mammogram will last about 15 to 30 minutes.  AFTER THE TEST Finding out the results of your test Ask when your test results will be ready. Make sure you get your test results.  Document Released: 01/15/2009 Document Revised: 10/08/2011 Document Reviewed: 01/15/2009 [employer] Patient  Information 2012 [employer].   

## 2013-01-27 NOTE — Progress Notes (Signed)
Bethany [name] Jun 07, 1956 [id number]        57 y.o.  [id number] for annual exam.  Several issues noted below.  Past medical history,surgical history, medications, allergies, family history and social history were all reviewed and documented in the EPIC chart. ROS:  Was performed and pertinent positives and negatives are included in the history.  Exam: Kim assistant Filed Vitals:   01/27/13 0912  BP: 112/68  Height: 5\' 5"  (1.651 m)  Weight: 131 lb (59.421 kg)   General appearance  Normal Skin grossly normal Head/Neck normal with no cervical or supraclavicular adenopathy thyroid normal Lungs  clear Cardiac RR, without RMG Abdominal  soft, nontender, without masses, organomegaly or hernia Breasts  examined lying and sitting without masses, retractions, discharge or axillary adenopathy. Bilateral implants noted. Pelvic  Ext/BUS/vagina  normal with mild atrophic changes  Adnexa  Without masses or tenderness    Anus and perineum  normal   Rectovaginal  normal sphincter tone without palpated masses or tenderness.    Assessment/Plan:  57 y.o. [id number] female for annual exam.   1. Chronic diarrhea. For the past 6 months the patient has had chronic diarrhea. She does note that her children in the past had Giardia. We'll start with stool sample for ova and parasites. She OC Light kit also given.  She never returned the prior kits given. If sample negative recommend followup with gastroenterology and she agrees. 2. ERT. Patient using mini Pipelle 0.1 mg patch doing well. I again reviewed risks to include stroke heart attack DVT and breast cancer. ACOG and NAMS statements for at lowest dose for short period of time discussed. At this point she wants to continue and accepts the risks. I refilled her times a year. 3. Osteopenia. DEXA 03/2012 T score -1.5 FRAX 6.4%/0.6%. Vitamin D 70 last year. Plan repeat DEXA in another year or 2. Increase calcium reviewed. 4. Mammography 12/2011. Patient knows she is  over due and needs to schedule. SBE monthly reviewed. 5. Pap smear 2012. No Pap smear done today. History of CIN-2 with subsequent cryosurgery in 1990. All Pap smears normal afterwards. Status post hysterectomy for benign indications. Options to stop screening altogether versus less frequent screening options reviewed. We'll readdress on an annual basis. 6. Health maintenance. All blood work done through Dr. Jola Babinski office. Followup one year, sooner as needed.   Dara Lords MD, 9:37 AM 01/27/2013

## 2013-01-30 ENCOUNTER — Other Ambulatory Visit: Payer: 59

## 2013-01-31 LAB — OVA AND PARASITE SCREEN: OP: NONE SEEN

## 2013-02-01 ENCOUNTER — Encounter: Payer: Self-pay | Admitting: Gastroenterology

## 2013-02-07 ENCOUNTER — Other Ambulatory Visit: Payer: Self-pay | Admitting: Anesthesiology

## 2013-02-07 DIAGNOSIS — Z1211 Encounter for screening for malignant neoplasm of colon: Secondary | ICD-10-CM

## 2013-02-10 ENCOUNTER — Encounter: Payer: Self-pay | Admitting: *Deleted

## 2013-02-16 ENCOUNTER — Other Ambulatory Visit: Payer: Self-pay | Admitting: Medical

## 2013-02-16 NOTE — Telephone Encounter (Signed)
Is this okay to refill? 

## 2013-02-23 ENCOUNTER — Ambulatory Visit: Payer: 59 | Admitting: Gastroenterology

## 2013-03-10 ENCOUNTER — Ambulatory Visit: Payer: 59 | Admitting: Gastroenterology

## 2013-04-12 ENCOUNTER — Telehealth: Payer: Self-pay | Admitting: Internal Medicine

## 2013-04-12 MED ORDER — SIMVASTATIN 80 MG PO TABS: 80.0000 mg | ORAL_TABLET | Freq: Every day | ORAL | Status: DC

## 2013-04-12 NOTE — Telephone Encounter (Signed)
SENT MED IN 

## 2013-04-12 NOTE — Telephone Encounter (Signed)
Refill request for simvastatin 80mg  to [facility] [address]

## 2013-04-14 ENCOUNTER — Telehealth: Payer: Self-pay | Admitting: Internal Medicine

## 2013-04-14 MED ORDER — SIMVASTATIN 80 MG PO TABS: 80.0000 mg | ORAL_TABLET | Freq: Every day | ORAL | Status: DC

## 2013-04-14 NOTE — Telephone Encounter (Signed)
Med was sent to pharmacy 

## 2013-04-17 ENCOUNTER — Telehealth: Payer: Self-pay | Admitting: Internal Medicine

## 2013-04-17 MED ORDER — TRIAMCINOLONE ACETONIDE 0.5 % EX CREA: TOPICAL_CREAM | Freq: Three times a day (TID) | CUTANEOUS | Status: DC | PRN

## 2013-04-17 NOTE — Telephone Encounter (Signed)
Pt states she has poision ivy and she saw you last week at [facility] and you told her to put hydrocortisone cream and ice on it. She says she put 2.5% hydrocortisone cream and ice on it like you said and its not helping any. She wants to know what else she can do without coming in. Send to [facility] [address] if you send in a rx. Call pt and inform

## 2013-04-17 NOTE — Telephone Encounter (Signed)
Let her know that I called a  stronger cream in for her.

## 2013-07-04 ENCOUNTER — Encounter: Payer: Self-pay | Admitting: Internal Medicine

## 2013-07-05 ENCOUNTER — Other Ambulatory Visit: Payer: Self-pay | Admitting: *Deleted

## 2013-07-05 DIAGNOSIS — R921 Mammographic calcification found on diagnostic imaging of breast: Secondary | ICD-10-CM

## 2013-08-25 ENCOUNTER — Other Ambulatory Visit: Payer: Self-pay | Admitting: Dermatology

## 2013-09-07 ENCOUNTER — Ambulatory Visit (INDEPENDENT_AMBULATORY_CARE_PROVIDER_SITE_OTHER): Payer: 59 | Admitting: Family Medicine

## 2013-09-07 ENCOUNTER — Encounter: Payer: Self-pay | Admitting: Family Medicine

## 2013-09-07 VITALS — BP 110/60 | HR 83 | Ht 65.0 in | Wt 131.0 lb

## 2013-09-07 DIAGNOSIS — B001 Herpesviral vesicular dermatitis: Secondary | ICD-10-CM

## 2013-09-07 DIAGNOSIS — M129 Arthropathy, unspecified: Secondary | ICD-10-CM

## 2013-09-07 DIAGNOSIS — M549 Dorsalgia, unspecified: Secondary | ICD-10-CM

## 2013-09-07 DIAGNOSIS — E785 Hyperlipidemia, unspecified: Secondary | ICD-10-CM

## 2013-09-07 DIAGNOSIS — Z23 Encounter for immunization: Secondary | ICD-10-CM

## 2013-09-07 DIAGNOSIS — G8929 Other chronic pain: Secondary | ICD-10-CM

## 2013-09-07 DIAGNOSIS — I7300 Raynaud's syndrome without gangrene: Secondary | ICD-10-CM

## 2013-09-07 DIAGNOSIS — M1990 Unspecified osteoarthritis, unspecified site: Secondary | ICD-10-CM

## 2013-09-07 DIAGNOSIS — B009 Herpesviral infection, unspecified: Secondary | ICD-10-CM

## 2013-09-07 DIAGNOSIS — Z0000 Encounter for general adult medical examination without abnormal findings: Secondary | ICD-10-CM

## 2013-09-07 LAB — LIPID PANEL
HDL: 80 mg/dL (ref >39)
Total CHOL/HDL Ratio: 2.4 Ratio
VLDL: 15 mg/dL (ref 0–40)

## 2013-09-07 LAB — CBC WITH DIFFERENTIAL/PLATELET
Basophils Absolute: 0 10*3/uL (ref 0.0–0.1)
Basophils Relative: 1 % (ref 0–1)
Eosinophils Absolute: 0.1 10*3/uL (ref 0.0–0.7)
Eosinophils Relative: 1 % (ref 0–5)
MCH: 33.5 pg (ref 26.0–34.0)
MCHC: 34.7 g/dL (ref 30.0–36.0)
MCV: 96.6 fL (ref 78.0–100.0)
Platelets: 188 10*3/uL (ref 150–400)
RDW: 13.9 % (ref 11.5–15.5)
WBC: 4.9 10*3/uL (ref 4.0–10.5)

## 2013-09-07 LAB — COMPREHENSIVE METABOLIC PANEL
ALT: 14 U/L (ref 0–35)
AST: 26 U/L (ref 0–37)
Creat: 0.89 mg/dL (ref 0.50–1.10)
Total Bilirubin: 0.5 mg/dL (ref 0.3–1.2)

## 2013-09-07 NOTE — Progress Notes (Signed)
Subjective:    Patient ID: Bethany [name], female    DOB: 1956/01/28, 57 y.o.   MRN: [id number]  HPI She is here for complete examination. She has had a rough year her addiction, taking some of her husband's pain medication.. She is now seeing a new psychiatrist, Dr. Carver Fila whom she likes. Presently she is only taking gabapentin. She is considering stopping it. She is now off all other psychotropics and feels very good about this. She does have a history of chronic back pain in the past had one epidural injection. She would like to see if this will help. She continues on her statin and is having no difficulty with this. She does have difficulty with Raynaud's disease however this has not gotten any worse. She does occasionally have difficulty with cold sores. She does use Nicorette gum tend to 15 per day. She is not quite ready to give this up. Her family and social history were reviewed. Marriage is going well as is her work. Health maintenance issues were reviewed. She continues to see her gynecologist.   Review of Systems  Constitutional: Negative.   HENT: Negative.   Eyes: Negative.   Respiratory: Negative.   Cardiovascular: Negative.   Gastrointestinal: Negative.   Endocrine: Negative.   Genitourinary: Negative.   Musculoskeletal: Negative.   Allergic/Immunologic: Negative.   Neurological: Negative.   Hematological: Negative.   Psychiatric/Behavioral: Negative.        Objective:   Physical Exam BP 110/60  Pulse 83  Ht 5\' 5"  (1.651 m)  Wt 131 lb (59.421 kg)  BMI 21.80 kg/m2  General Appearance:    Alert, cooperative, no distress, appears stated age  Head:    Normocephalic, without obvious abnormality, atraumatic  Eyes:    PERRL, conjunctiva/corneas clear, EOM's intact, fundi    benign  Ears:    Normal TM's and external ear canals  Nose:   Nares normal, mucosa normal, no drainage or sinus   tenderness  Throat:   Lips, mucosa, and tongue normal; teeth and gums normal  Neck:    Supple, no lymphadenopathy;  thyroid:  no   enlargement/tenderness/nodules; no carotid   bruit or JVD  Back:    Spine nontender, no curvature, ROM normal, no CVA     tenderness  Lungs:     Clear to auscultation bilaterally without wheezes, rales or     ronchi; respirations unlabored  Chest Wall:    No tenderness or deformity   Heart:    Regular rate and rhythm, S1 and S2 normal, no murmur, rub   or gallop  Breast Exam:    Deferred to GYN  Abdomen:     Soft, non-tender, nondistended, normoactive bowel sounds,    no masses, no hepatosplenomegaly  Genitalia:    Deferred to GYN     Extremities:   No clubbing, cyanosis or edema  Pulses:   2+ and symmetric all extremities  Skin:   Skin color, texture, turgor normal, no rashes or lesions  Lymph nodes:   Cervical, supraclavicular, and axillary nodes normal  Neurologic:   CNII-XII intact, normal strength, sensation and gait; reflexes 2+ and symmetric throughout          Psych:   Normal mood, affect, hygiene and grooming.          Assessment & Plan:  Routine general medical examination at a health care facility - Plan: CBC with Differential, Comprehensive metabolic panel, Lipid panel  Chronic back pain  Hyperlipidemia LDL goal < 100 -  Plan: Lipid panel  Arthritis  Raynaud's disease - Plan: CBC with Differential, Comprehensive metabolic panel  Herpes labialis  Need for prophylactic vaccination and inoculation against influenza - Plan: Flu Vaccine QUAD 36+ mos IM She seems to be handling the setback well and her husband is quite supportive. We'll refer her to Dr. Ethelene Hal for consideration of epidural. She will continue on her other medications.

## 2013-09-07 NOTE — Progress Notes (Signed)
FAXED REQUEST TO [facility] FOR DR.RAMOS

## 2014-02-16 ENCOUNTER — Other Ambulatory Visit: Payer: Self-pay | Admitting: Gynecology

## 2014-02-28 ENCOUNTER — Ambulatory Visit (INDEPENDENT_AMBULATORY_CARE_PROVIDER_SITE_OTHER): Payer: 59 | Admitting: Gynecology

## 2014-02-28 ENCOUNTER — Other Ambulatory Visit (HOSPITAL_COMMUNITY)
Admission: RE | Admit: 2014-02-28 | Discharge: 2014-02-28 | Disposition: A | Discharge status: Home / Self Care | Payer: 59 | Source: Ambulatory Visit | Attending: Gynecology | Admitting: Gynecology

## 2014-02-28 ENCOUNTER — Encounter: Payer: Self-pay | Admitting: Gynecology

## 2014-02-28 VITALS — BP 120/68 | Ht 66.0 in | Wt 130.0 lb

## 2014-02-28 DIAGNOSIS — Z79890 Hormone replacement therapy (postmenopausal): Secondary | ICD-10-CM

## 2014-02-28 DIAGNOSIS — M8580 Other specified disorders of bone density and structure, unspecified site: Secondary | ICD-10-CM

## 2014-02-28 DIAGNOSIS — Z01419 Encounter for gynecological examination (general) (routine) without abnormal findings: Secondary | ICD-10-CM | POA: Insufficient documentation

## 2014-02-28 DIAGNOSIS — M949 Disorder of cartilage, unspecified: Secondary | ICD-10-CM

## 2014-02-28 DIAGNOSIS — M899 Disorder of bone, unspecified: Secondary | ICD-10-CM

## 2014-02-28 LAB — URINALYSIS W MICROSCOPIC + REFLEX CULTURE
BILIRUBIN URINE: NEGATIVE
Bacteria, UA: NONE SEEN
Casts: NONE SEEN
GLUCOSE, UA: NEGATIVE mg/dL
Hgb urine dipstick: NEGATIVE
Leukocytes, UA: NEGATIVE
Nitrite: NEGATIVE
Protein, ur: NEGATIVE mg/dL
RBC / HPF: NONE SEEN RBC/hpf (ref <3)
Urobilinogen, UA: 0.2 mg/dL (ref 0.0–1.0)
pH: 5.5 (ref 5.0–8.0)

## 2014-02-28 LAB — HM PAP SMEAR: HM Pap smear: NORMAL

## 2014-02-28 MED ORDER — MINIVELLE 0.1 MG/24HR TD PTTW: MEDICATED_PATCH | TRANSDERMAL | Status: DC

## 2014-02-28 NOTE — Addendum Note (Signed)
Addended by: Nelva Nay on: 02/28/2014 09:44 AM   Modules accepted: Orders

## 2014-02-28 NOTE — Patient Instructions (Signed)
Followup in 1 year for a new exam, sooner if any issues.  Health Maintenance, Female A healthy lifestyle and preventative care can promote health and wellness.  Maintain regular health, dental, and eye exams.  Eat a healthy diet. Foods like vegetables, fruits, whole grains, low-fat dairy products, and lean protein foods contain the nutrients you need without too many calories. Decrease your intake of foods high in solid fats, added sugars, and salt. Get information about a proper diet from your caregiver, if necessary.  Regular physical exercise is one of the most important things you can do for your health. Most adults should get at least 150 minutes of moderate-intensity exercise (any activity that increases your heart rate and causes you to sweat) each week. In addition, most adults need muscle-strengthening exercises on 2 or more days a week.   Maintain a healthy weight. The body mass index (BMI) is a screening tool to identify possible weight problems. It provides an estimate of body fat based on height and weight. Your caregiver can help determine your BMI, and can help you achieve or maintain a healthy weight. For adults 20 years and older:  A BMI below 18.5 is considered underweight.  A BMI of 18.5 to 24.9 is normal.  A BMI of 25 to 29.9 is considered overweight.  A BMI of 30 and above is considered obese.  Maintain normal blood lipids and cholesterol by exercising and minimizing your intake of saturated fat. Eat a balanced diet with plenty of fruits and vegetables. Blood tests for lipids and cholesterol should begin at age 58 and be repeated every 5 years. If your lipid or cholesterol levels are high, you are over 50, or you are a high risk for heart disease, you may need your cholesterol levels checked more frequently.Ongoing high lipid and cholesterol levels should be treated with medicines if diet and exercise are not effective.  If you smoke, find out from your caregiver how to  quit. If you do not use tobacco, do not start.  Lung cancer screening is recommended for adults aged 36 80 years who are at high risk for developing lung cancer because of a history of smoking. Yearly low-dose computed tomography (CT) is recommended for people who have at least a 30-pack-year history of smoking and are a current smoker or have quit within the past 15 years. A pack year of smoking is smoking an average of 1 pack of cigarettes a day for 1 year (for example: 1 pack a day for 30 years or 2 packs a day for 15 years). Yearly screening should continue until the smoker has stopped smoking for at least 15 years. Yearly screening should also be stopped for people who develop a health problem that would prevent them from having lung cancer treatment.  If you are pregnant, do not drink alcohol. If you are breastfeeding, be very cautious about drinking alcohol. If you are not pregnant and choose to drink alcohol, do not exceed 1 drink per day. One drink is considered to be 12 ounces (355 mL) of beer, 5 ounces (148 mL) of wine, or 1.5 ounces (44 mL) of liquor.  Avoid use of street drugs. Do not share needles with anyone. Ask for help if you need support or instructions about stopping the use of drugs.  High blood pressure causes heart disease and increases the risk of stroke. Blood pressure should be checked at least every 1 to 2 years. Ongoing high blood pressure should be treated with medicines, if  weight loss and exercise are not effective.  If you are 28 to 58 years old, ask your caregiver if you should take aspirin to prevent strokes.  Diabetes screening involves taking a blood sample to check your fasting blood sugar level. This should be done once every 3 years, after age 61, if you are within normal weight and without risk factors for diabetes. Testing should be considered at a younger age or be carried out more frequently if you are overweight and have at least 1 risk factor for  diabetes.  Breast cancer screening is essential preventative care for women. You should practice "breast self-awareness." This means understanding the normal appearance and feel of your breasts and may include breast self-examination. Any changes detected, no matter how small, should be reported to a caregiver. Women in their 29s and 30s should have a clinical breast exam (CBE) by a caregiver as part of a regular health exam every 1 to 3 years. After age 37, women should have a CBE every year. Starting at age 77, women should consider having a mammogram (breast X-ray) every year. Women who have a family history of breast cancer should talk to their caregiver about genetic screening. Women at a high risk of breast cancer should talk to their caregiver about having an MRI and a mammogram every year.  Breast cancer gene (BRCA)-related cancer risk assessment is recommended for women who have family members with BRCA-related cancers. BRCA-related cancers include breast, ovarian, tubal, and peritoneal cancers. Having family members with these cancers may be associated with an increased risk for harmful changes (mutations) in the breast cancer genes BRCA1 and BRCA2. Results of the assessment will determine the need for genetic counseling and BRCA1 and BRCA2 testing.  The Pap test is a screening test for cervical cancer. Women should have a Pap test starting at age 71. Between ages 11 and 17, Pap tests should be repeated every 2 years. Beginning at age 98, you should have a Pap test every 3 years as long as the past 3 Pap tests have been normal. If you had a hysterectomy for a problem that was not cancer or a condition that could lead to cancer, then you no longer need Pap tests. If you are between ages 64 and 37, and you have had normal Pap tests going back 10 years, you no longer need Pap tests. If you have had past treatment for cervical cancer or a condition that could lead to cancer, you need Pap tests and  screening for cancer for at least 20 years after your treatment. If Pap tests have been discontinued, risk factors (such as a new sexual partner) need to be reassessed to determine if screening should be resumed. Some women have medical problems that increase the chance of getting cervical cancer. In these cases, your caregiver may recommend more frequent screening and Pap tests.  The human papillomavirus (HPV) test is an additional test that may be used for cervical cancer screening. The HPV test looks for the virus that can cause the cell changes on the cervix. The cells collected during the Pap test can be tested for HPV. The HPV test could be used to screen women aged 44 years and older, and should be used in women of any age who have unclear Pap test results. After the age of 78, women should have HPV testing at the same frequency as a Pap test.  Colorectal cancer can be detected and often prevented. Most routine colorectal cancer screening begins  at the age of 56 and continues through age 26. However, your caregiver may recommend screening at an earlier age if you have risk factors for colon cancer. On a yearly basis, your caregiver may provide home test kits to check for hidden blood in the stool. Use of a small camera at the end of a tube, to directly examine the colon (sigmoidoscopy or colonoscopy), can detect the earliest forms of colorectal cancer. Talk to your caregiver about this at age 36, when routine screening begins. Direct examination of the colon should be repeated every 5 to 10 years through age 4, unless early forms of pre-cancerous polyps or small growths are found.  Hepatitis C blood testing is recommended for all people born from [birth year] through 1965 and any individual with known risks for hepatitis C.  Practice safe sex. Use condoms and avoid high-risk sexual practices to reduce the spread of sexually transmitted infections (STIs). Sexually active women aged 66 and younger should be  checked for Chlamydia, which is a common sexually transmitted infection. Older women with new or multiple partners should also be tested for Chlamydia. Testing for other STIs is recommended if you are sexually active and at increased risk.  Osteoporosis is a disease in which the bones lose minerals and strength with aging. This can result in serious bone fractures. The risk of osteoporosis can be identified using a bone density scan. Women ages 47 and over and women at risk for fractures or osteoporosis should discuss screening with their caregivers. Ask your caregiver whether you should be taking a calcium supplement or vitamin D to reduce the rate of osteoporosis.  Menopause can be associated with physical symptoms and risks. Hormone replacement therapy is available to decrease symptoms and risks. You should talk to your caregiver about whether hormone replacement therapy is right for you.  Use sunscreen. Apply sunscreen liberally and repeatedly throughout the day. You should seek shade when your shadow is shorter than you. Protect yourself by wearing long sleeves, pants, a wide-brimmed hat, and sunglasses year round, whenever you are outdoors.  Notify your caregiver of new moles or changes in moles, especially if there is a change in shape or color. Also notify your caregiver if a mole is larger than the size of a pencil eraser.  Stay current with your immunizations. Document Released: 05/04/2011 Document Revised: 02/13/2013 Document Reviewed: 05/04/2011 [facility] Patient Information 2014 [employer].

## 2014-02-28 NOTE — Progress Notes (Signed)
Bethany [name] 03-11-1956 [id number]        58 y.o.  [id number] for annual exam.  Several issues noted below.  Past medical history,surgical history, problem list, medications, allergies, family history and social history were all reviewed and documented as reviewed in the EPIC chart.  ROS:  12 system ROS performed with pertinent positives and negatives included in the history, assessment and plan.  Included Systems: General, HEENT, Neck, Cardiovascular, Pulmonary, Gastrointestinal, Genitourinary, Musculoskeletal, Dermatologic, Endocrine, Hematological, Neurologic, Psychiatric Additional significant findings : None   Exam: Kim assistant Filed Vitals:   02/28/14 0904  BP: 120/68  Height: 5\' 6"  (1.676 m)  Weight: 130 lb (58.968 kg)   General appearance:  Normal affect, orientation and appearance. Skin: Grossly normal HEENT: Without gross lesions.  No cervical or supraclavicular adenopathy. Thyroid normal.  Lungs:  Clear without wheezing, rales or rhonchi Cardiac: RR, without RMG Abdominal:  Soft, nontender, without masses, guarding, rebound, organomegaly or hernia Breasts:  Examined lying and sitting without masses, retractions, discharge or axillary adenopathy. Bilateral implants noted. Pelvic:  Ext/BUS/vagina normal. Pap of cuff done  Adnexa  Without masses or tenderness    Anus and perineum  Normal   Rectovaginal  Normal sphincter tone without palpated masses or tenderness.    Assessment/Plan:  58 y.o. [name] female for annual exam.   1. Postmenopausal/HRT status post TVH 2005 or adenomyosis. Tried stopping her Minivelle 0.1 mg patchs and had unacceptable hot flashes.  I again reviewed the whole issue of HRT with her to include the WHI study with increased risk of stroke, heart attack, DVT and breast cancer. The ACOG and NAMS statements for lowest dose for the shortest period of time reviewed. Transdermal versus oral first-pass effect benefit discussed. The patient is comfortable  with continuing and I refilled her x1 year. 2. Osteopenia. DEXA 03/2014 T score -1.5. FRAX 6.4%/0.6%. Repeat DEXA now at a two-year interval. Increase calcium vitamin D reviewed. Vitamin D 70 2 years ago. 3. Pap smear 2012. Pap of cuff done. History of CIN 12/21/1988. Normal Pap smears since then. Reviewed current screening guidelines and options to stop screening altogether and she is status post hysterectomy for benign indications versus continuing screening reviewed. Will readdress on an annual basis. 4. Mammography 07/2013. Continue with annual mammography. SBE monthly review. 5. Colonoscopy 2005. Recommended repeat screening colonoscopy this year as she is 10 years out. Patient agrees to arrange. 6. Health maintenance. No routine blood work done as patient has this done at her primary physician's office. Followup for DEXA, otherwise annually.   Note: This document was prepared with digital dictation and possible smart phrase technology. Any transcriptional errors that result from this process are unintentional.   [name], 9:29 AM 02/28/2014

## 2014-04-02 DIAGNOSIS — M8580 Other specified disorders of bone density and structure, unspecified site: Secondary | ICD-10-CM

## 2014-04-02 HISTORY — DX: Other specified disorders of bone density and structure, unspecified site: M85.80

## 2014-04-10 ENCOUNTER — Ambulatory Visit (INDEPENDENT_AMBULATORY_CARE_PROVIDER_SITE_OTHER): Payer: 59

## 2014-04-10 ENCOUNTER — Encounter: Payer: Self-pay | Admitting: Gynecology

## 2014-04-10 DIAGNOSIS — M8580 Other specified disorders of bone density and structure, unspecified site: Secondary | ICD-10-CM

## 2014-04-10 DIAGNOSIS — M899 Disorder of bone, unspecified: Secondary | ICD-10-CM

## 2014-04-10 DIAGNOSIS — M949 Disorder of cartilage, unspecified: Secondary | ICD-10-CM

## 2014-04-10 LAB — HM DEXA SCAN

## 2014-04-18 ENCOUNTER — Other Ambulatory Visit: Payer: Self-pay | Admitting: Family Medicine

## 2014-08-06 ENCOUNTER — Other Ambulatory Visit: Payer: Self-pay | Admitting: Dermatology

## 2014-08-15 ENCOUNTER — Other Ambulatory Visit: Payer: Self-pay | Admitting: Family Medicine

## 2014-09-03 ENCOUNTER — Encounter: Payer: Self-pay | Admitting: Gynecology

## 2014-09-10 ENCOUNTER — Encounter: Payer: Self-pay | Admitting: Family Medicine

## 2014-09-10 ENCOUNTER — Ambulatory Visit (INDEPENDENT_AMBULATORY_CARE_PROVIDER_SITE_OTHER): Payer: 59 | Admitting: Family Medicine

## 2014-09-10 VITALS — BP 112/70 | HR 60 | Ht 65.0 in | Wt 127.0 lb

## 2014-09-10 DIAGNOSIS — M549 Dorsalgia, unspecified: Secondary | ICD-10-CM

## 2014-09-10 DIAGNOSIS — F39 Unspecified mood [affective] disorder: Secondary | ICD-10-CM

## 2014-09-10 DIAGNOSIS — H6122 Impacted cerumen, left ear: Secondary | ICD-10-CM

## 2014-09-10 DIAGNOSIS — B001 Herpesviral vesicular dermatitis: Secondary | ICD-10-CM

## 2014-09-10 DIAGNOSIS — E785 Hyperlipidemia, unspecified: Secondary | ICD-10-CM

## 2014-09-10 DIAGNOSIS — Z23 Encounter for immunization: Secondary | ICD-10-CM

## 2014-09-10 DIAGNOSIS — I7300 Raynaud's syndrome without gangrene: Secondary | ICD-10-CM

## 2014-09-10 DIAGNOSIS — G8929 Other chronic pain: Secondary | ICD-10-CM

## 2014-09-10 DIAGNOSIS — Z79899 Other long term (current) drug therapy: Secondary | ICD-10-CM

## 2014-09-10 LAB — CBC WITH DIFFERENTIAL/PLATELET
Basophils Absolute: 0 10*3/uL (ref 0.0–0.1)
Basophils Relative: 1 % (ref 0–1)
Eosinophils Absolute: 0 10*3/uL (ref 0.0–0.7)
Eosinophils Relative: 1 % (ref 0–5)
HEMATOCRIT: 38.8 % (ref 36.0–46.0)
HEMOGLOBIN: 13 g/dL (ref 12.0–15.0)
LYMPHS PCT: 31 % (ref 12–46)
Lymphs Abs: 1.5 10*3/uL (ref 0.7–4.0)
MCH: 32.7 pg (ref 26.0–34.0)
MCHC: 33.5 g/dL (ref 30.0–36.0)
MCV: 97.7 fL (ref 78.0–100.0)
MONOS PCT: 7 % (ref 3–12)
Monocytes Absolute: 0.3 10*3/uL (ref 0.1–1.0)
NEUTROS ABS: 2.9 10*3/uL (ref 1.7–7.7)
Neutrophils Relative %: 60 % (ref 43–77)
Platelets: 185 10*3/uL (ref 150–400)
RBC: 3.97 MIL/uL (ref 3.87–5.11)
RDW: 13.4 % (ref 11.5–15.5)
WBC: 4.8 10*3/uL (ref 4.0–10.5)

## 2014-09-10 LAB — COMPREHENSIVE METABOLIC PANEL
ALBUMIN: 4.5 g/dL (ref 3.5–5.2)
ALT: 16 U/L (ref 0–35)
AST: 30 U/L (ref 0–37)
Alkaline Phosphatase: 40 U/L (ref 39–117)
BUN: 12 mg/dL (ref 6–23)
CHLORIDE: 103 meq/L (ref 96–112)
CO2: 30 mEq/L (ref 19–32)
Calcium: 9 mg/dL (ref 8.4–10.5)
Creat: 0.97 mg/dL (ref 0.50–1.10)
GLUCOSE: 79 mg/dL (ref 70–99)
POTASSIUM: 4.5 meq/L (ref 3.5–5.3)
Sodium: 139 mEq/L (ref 135–145)
Total Bilirubin: 0.4 mg/dL (ref 0.2–1.2)
Total Protein: 6.4 g/dL (ref 6.0–8.3)

## 2014-09-10 LAB — LIPID PANEL
Cholesterol: 170 mg/dL (ref 0–200)
HDL: 79 mg/dL (ref >39)
LDL Cholesterol: 78 mg/dL (ref 0–99)
TRIGLYCERIDES: 63 mg/dL (ref <150)
Total CHOL/HDL Ratio: 2.2 Ratio
VLDL: 13 mg/dL (ref 0–40)

## 2014-09-10 MED ORDER — SIMVASTATIN 40 MG PO TABS: ORAL_TABLET | ORAL | Status: DC

## 2014-09-10 MED ORDER — HYDROCORTISONE 2.5 % RE CREA: TOPICAL_CREAM | RECTAL | Status: DC

## 2014-09-10 NOTE — Progress Notes (Signed)
   Subjective:    Patient ID: Bethany [name], female    DOB: Jun 30, 1956, 58 y.o.   MRN: [id number]  HPI She is here for complete examination. She has been recently switched to Lamictal and states that she is feeling much better. She feels that it has quieted her brain down. She is tapering off of Neurontin. She is being followed by Dr. Edgar Frisk. She is being followed by her gynecologist and continues on estrogen replacement. She does use naproxen for chronic back pain and takes 80 mg of Zocor per day. She does occasionally use Valtrex for herpes labialis. She has a history of Raynaud's disease however has very little difficulty with this. She will monitor this over the winter months. She has had difficulty hearing from the left ear. She does use Q-tips. Her work and home life are going well. Social and family history was otherwise reviewed.   Review of Systems  All other systems reviewed and are negative.      Objective:   Physical Exam BP 112/70 mmHg  Pulse 60  Ht 5\' 5"  (1.651 m)  Wt 127 lb (57.607 kg)  BMI 21.13 kg/m2  SpO2 98%  General Appearance:    Alert, cooperative, no distress, appears stated age  Head:    Normocephalic, without obvious abnormality, atraumatic  Eyes:    PERRL, conjunctiva/corneas clear, EOM's intact, fundi    benign  Ears:    Normal TM's and external ear canals.cerumen was manually removed from the left canal  Nose:   Nares normal, mucosa normal, no drainage or sinus   tenderness  Throat:   Lips, mucosa, and tongue normal; teeth and gums normal  Neck:   Supple, no lymphadenopathy;  thyroid:  no   enlargement/tenderness/nodules; no carotid   bruit or JVD  Back:    Spine nontender, no curvature, ROM normal, no CVA     tenderness  Lungs:     Clear to auscultation bilaterally without wheezes, rales or     ronchi; respirations unlabored  Chest Wall:    No tenderness or deformity   Heart:    Regular rate and rhythm, S1 and S2 normal, no murmur, rub   or gallop    Breast Exam:    Deferred to GYN  Abdomen:     Soft, non-tender, nondistended, normoactive bowel sounds,    no masses, no hepatosplenomegaly  Genitalia:    Deferred to GYN     Extremities:   No clubbing, cyanosis or edema  Pulses:   2+ and symmetric all extremities  Skin:   Skin color, texture, turgor normal, no rashes or lesions  Lymph nodes:   Cervical, supraclavicular, and axillary nodes normal  Neurologic:   CNII-XII intact, normal strength, sensation and gait; reflexes 2+ and symmetric throughout          Psych:   Normal mood, affect, hygiene and grooming.          Assessment & Plan:  Chronic back pain  Mood disorder  Hyperlipidemia with target LDL less than 100 - Plan: simvastatin (ZOCOR) 40 MG tablet, Lipid panel  Recurrent herpes labialis - Plan: CBC with Differential, Comprehensive metabolic panel  Raynaud's disease - Plan: CBC with Differential, Comprehensive metabolic panel  Cerumen impaction, left  Need for prophylactic vaccination and inoculation against influenza - Plan: Flu Vaccine QUAD 36+ mos IM  Encounter for long-term (current) use of medications - Plan: hydrocortisone (PROCTOZONE-HC) 2.5 % rectal cream, CBC with Differential, Comprehensive metabolic panel, Lipid panel

## 2014-11-16 ENCOUNTER — Telehealth: Payer: Self-pay | Admitting: Family Medicine

## 2014-11-16 DIAGNOSIS — I7300 Raynaud's syndrome without gangrene: Secondary | ICD-10-CM

## 2014-11-16 MED ORDER — AMLODIPINE BESYLATE 2.5 MG PO TABS: 2.5000 mg | ORAL_TABLET | Freq: Every day | ORAL | Status: DC

## 2014-11-16 NOTE — Telephone Encounter (Signed)
Pt called and stated that at last appt it was discussed that she go on medication for Raynaud's disease. She stated that at that time she didn't go on it because symptoms were not that bad. She now states the she would like to go on medication that you both talked about. Pt uses [facility] [address] and can be reached at [phone] or [phone].

## 2015-01-10 ENCOUNTER — Other Ambulatory Visit: Payer: Self-pay | Admitting: Dermatology

## 2015-01-22 ENCOUNTER — Telehealth: Payer: Self-pay | Admitting: Internal Medicine

## 2015-01-22 MED ORDER — VALACYCLOVIR HCL 1 G PO TABS: 1000.0000 mg | ORAL_TABLET | Freq: Two times a day (BID) | ORAL | Status: DC

## 2015-01-22 NOTE — Telephone Encounter (Signed)
Pt needs a refill on valtrex to [facility] [address]

## 2015-02-13 ENCOUNTER — Other Ambulatory Visit: Payer: Self-pay | Admitting: Gynecology

## 2015-03-25 ENCOUNTER — Other Ambulatory Visit: Payer: Self-pay | Admitting: Gynecology

## 2015-04-09 ENCOUNTER — Encounter: Payer: Self-pay | Admitting: Gynecology

## 2015-04-24 ENCOUNTER — Encounter: Payer: Self-pay | Admitting: Gynecology

## 2015-04-24 ENCOUNTER — Ambulatory Visit (INDEPENDENT_AMBULATORY_CARE_PROVIDER_SITE_OTHER): Payer: 59 | Admitting: Gynecology

## 2015-04-24 VITALS — BP 116/76 | Ht 65.0 in | Wt 124.0 lb

## 2015-04-24 DIAGNOSIS — Z79890 Hormone replacement therapy (postmenopausal): Secondary | ICD-10-CM

## 2015-04-24 DIAGNOSIS — Z01419 Encounter for gynecological examination (general) (routine) without abnormal findings: Secondary | ICD-10-CM

## 2015-04-24 MED ORDER — MINIVELLE 0.1 MG/24HR TD PTTW: MEDICATED_PATCH | TRANSDERMAL | Status: DC

## 2015-04-24 NOTE — Patient Instructions (Addendum)
Recommend scheduling a screening colonoscopy as you are greater than 10 years from your last colonoscopy.  You may obtain a copy of any labs that were done today by logging onto MyChart as outlined in the instructions provided with your AVS (after visit summary). The office will not call with normal lab results but certainly if there are any significant abnormalities then we will contact you.   Health Maintenance, Female A healthy lifestyle and preventative care can promote health and wellness.  Maintain regular health, dental, and eye exams.  Eat a healthy diet. Foods like vegetables, fruits, whole grains, low-fat dairy products, and lean protein foods contain the nutrients you need without too many calories. Decrease your intake of foods high in solid fats, added sugars, and salt. Get information about a proper diet from your caregiver, if necessary.  Regular physical exercise is one of the most important things you can do for your health. Most adults should get at least 150 minutes of moderate-intensity exercise (any activity that increases your heart rate and causes you to sweat) each week. In addition, most adults need muscle-strengthening exercises on 2 or more days a week.   Maintain a healthy weight. The body mass index (BMI) is a screening tool to identify possible weight problems. It provides an estimate of body fat based on height and weight. Your caregiver can help determine your BMI, and can help you achieve or maintain a healthy weight. For adults 20 years and older:  A BMI below 18.5 is considered underweight.  A BMI of 18.5 to 24.9 is normal.  A BMI of 25 to 29.9 is considered overweight.  A BMI of 30 and above is considered obese.  Maintain normal blood lipids and cholesterol by exercising and minimizing your intake of saturated fat. Eat a balanced diet with plenty of fruits and vegetables. Blood tests for lipids and cholesterol should begin at age 54 and be repeated every 5  years. If your lipid or cholesterol levels are high, you are over 50, or you are a high risk for heart disease, you may need your cholesterol levels checked more frequently.Ongoing high lipid and cholesterol levels should be treated with medicines if diet and exercise are not effective.  If you smoke, find out from your caregiver how to quit. If you do not use tobacco, do not start.  Lung cancer screening is recommended for adults aged 78 80 years who are at high risk for developing lung cancer because of a history of smoking. Yearly low-dose computed tomography (CT) is recommended for people who have at least a 30-pack-year history of smoking and are a current smoker or have quit within the past 15 years. A pack year of smoking is smoking an average of 1 pack of cigarettes a day for 1 year (for example: 1 pack a day for 30 years or 2 packs a day for 15 years). Yearly screening should continue until the smoker has stopped smoking for at least 15 years. Yearly screening should also be stopped for people who develop a health problem that would prevent them from having lung cancer treatment.  If you are pregnant, do not drink alcohol. If you are breastfeeding, be very cautious about drinking alcohol. If you are not pregnant and choose to drink alcohol, do not exceed 1 drink per day. One drink is considered to be 12 ounces (355 mL) of beer, 5 ounces (148 mL) of wine, or 1.5 ounces (44 mL) of liquor.  Avoid use of street drugs.  Do not share needles with anyone. Ask for help if you need support or instructions about stopping the use of drugs.  High blood pressure causes heart disease and increases the risk of stroke. Blood pressure should be checked at least every 1 to 2 years. Ongoing high blood pressure should be treated with medicines, if weight loss and exercise are not effective.  If you are 90 to 59 years old, ask your caregiver if you should take aspirin to prevent strokes.  Diabetes screening  involves taking a blood sample to check your fasting blood sugar level. This should be done once every 3 years, after age 20, if you are within normal weight and without risk factors for diabetes. Testing should be considered at a younger age or be carried out more frequently if you are overweight and have at least 1 risk factor for diabetes.  Breast cancer screening is essential preventative care for women. You should practice "breast self-awareness." This means understanding the normal appearance and feel of your breasts and may include breast self-examination. Any changes detected, no matter how small, should be reported to a caregiver. Women in their 59s and 30s should have a clinical breast exam (CBE) by a caregiver as part of a regular health exam every 1 to 3 years. After age 77, women should have a CBE every year. Starting at age 66, women should consider having a mammogram (breast X-ray) every year. Women who have a family history of breast cancer should talk to their caregiver about genetic screening. Women at a high risk of breast cancer should talk to their caregiver about having an MRI and a mammogram every year.  Breast cancer gene (BRCA)-related cancer risk assessment is recommended for women who have family members with BRCA-related cancers. BRCA-related cancers include breast, ovarian, tubal, and peritoneal cancers. Having family members with these cancers may be associated with an increased risk for harmful changes (mutations) in the breast cancer genes BRCA1 and BRCA2. Results of the assessment will determine the need for genetic counseling and BRCA1 and BRCA2 testing.  The Pap test is a screening test for cervical cancer. Women should have a Pap test starting at age 23. Between ages 34 and 55, Pap tests should be repeated every 2 years. Beginning at age 50, you should have a Pap test every 3 years as long as the past 3 Pap tests have been normal. If you had a hysterectomy for a problem that  was not cancer or a condition that could lead to cancer, then you no longer need Pap tests. If you are between ages 77 and 70, and you have had normal Pap tests going back 10 years, you no longer need Pap tests. If you have had past treatment for cervical cancer or a condition that could lead to cancer, you need Pap tests and screening for cancer for at least 20 years after your treatment. If Pap tests have been discontinued, risk factors (such as a new sexual partner) need to be reassessed to determine if screening should be resumed. Some women have medical problems that increase the chance of getting cervical cancer. In these cases, your caregiver may recommend more frequent screening and Pap tests.  The human papillomavirus (HPV) test is an additional test that may be used for cervical cancer screening. The HPV test looks for the virus that can cause the cell changes on the cervix. The cells collected during the Pap test can be tested for HPV. The HPV test could be used  to screen women aged 70 years and older, and should be used in women of any age who have unclear Pap test results. After the age of 7, women should have HPV testing at the same frequency as a Pap test.  Colorectal cancer can be detected and often prevented. Most routine colorectal cancer screening begins at the age of 66 and continues through age 58. However, your caregiver may recommend screening at an earlier age if you have risk factors for colon cancer. On a yearly basis, your caregiver may provide home test kits to check for hidden blood in the stool. Use of a small camera at the end of a tube, to directly examine the colon (sigmoidoscopy or colonoscopy), can detect the earliest forms of colorectal cancer. Talk to your caregiver about this at age 41, when routine screening begins. Direct examination of the colon should be repeated every 5 to 10 years through age 64, unless early forms of pre-cancerous polyps or small growths are  found.  Hepatitis C blood testing is recommended for all people born from [birth year] through 1965 and any individual with known risks for hepatitis C.  Practice safe sex. Use condoms and avoid high-risk sexual practices to reduce the spread of sexually transmitted infections (STIs). Sexually active women aged 48 and younger should be checked for Chlamydia, which is a common sexually transmitted infection. Older women with new or multiple partners should also be tested for Chlamydia. Testing for other STIs is recommended if you are sexually active and at increased risk.  Osteoporosis is a disease in which the bones lose minerals and strength with aging. This can result in serious bone fractures. The risk of osteoporosis can be identified using a bone density scan. Women ages 38 and over and women at risk for fractures or osteoporosis should discuss screening with their caregivers. Ask your caregiver whether you should be taking a calcium supplement or vitamin D to reduce the rate of osteoporosis.  Menopause can be associated with physical symptoms and risks. Hormone replacement therapy is available to decrease symptoms and risks. You should talk to your caregiver about whether hormone replacement therapy is right for you.  Use sunscreen. Apply sunscreen liberally and repeatedly throughout the day. You should seek shade when your shadow is shorter than you. Protect yourself by wearing long sleeves, pants, a wide-brimmed hat, and sunglasses year round, whenever you are outdoors.  Notify your caregiver of new moles or changes in moles, especially if there is a change in shape or color. Also notify your caregiver if a mole is larger than the size of a pencil eraser.  Stay current with your immunizations. Document Released: 05/04/2011 Document Revised: 02/13/2013 Document Reviewed: 05/04/2011 [facility] Patient Information 2014 [place].

## 2015-04-24 NOTE — Progress Notes (Signed)
Bethany [name] 08/08/1956 [id number]        59 y.o.  [id number] for annual exam.  Doing well without complaints.  Past medical history,surgical history, problem list, medications, allergies, family history and social history were all reviewed and documented as reviewed in the EPIC chart.  ROS:  Performed with pertinent positives and negatives included in the history, assessment and plan.   Additional significant findings :  none   Exam: Kim Counsellor Vitals:   04/24/15 0910  BP: 116/76  Height: 5\' 5"  (1.651 m)  Weight: 124 lb (56.246 kg)   General appearance:  Normal affect, orientation and appearance. Skin: Grossly normal HEENT: Without gross lesions.  No cervical or supraclavicular adenopathy. Thyroid normal.  Lungs:  Clear without wheezing, rales or rhonchi Cardiac: RR, without RMG Abdominal:  Soft, nontender, without masses, guarding, rebound, organomegaly or hernia Breasts:  Examined lying and sitting without masses, retractions, discharge or axillary adenopathy.  Bilateral implants noted Pelvic:  Ext/BUS/vagina with atrophic changes  Adnexa  Without masses or tenderness    Anus and perineum  Normal   Rectovaginal  Normal sphincter tone without palpated masses or tenderness.    Assessment/Plan:  59 y.o. [id number] female for annual exam.   1. Postmenopausal/atrophic genital changes on HRT status post TVH BSO for adenomyosis. On minivelle 0.1 mg patches. Doing well wants to continue. I again reviewed the issues of HRT to include increased risk of stroke heart attack DVT and possible breast cancer. Tried stopping last year with unacceptable hot flushes and not feeling well. Understands and accepts the risks. Refill 1 year provided. 2. Osteopenia. DEXA 2015 T score -1.7 FRAX 7%/0.8%.  Increased calcium and vitamin D reviewed. Discussed possible repeat next year or waiting several years noting it is stable from her prior 2013 DEXA. We'll rediscuss next year. 3. Pap smear 2015.  No Pap smear done today.  History of cryosurgery for CIN 2 1990. Normal Pap smears since. Options to stop screening altogether versus less frequent screening intervals reviewed. At this point we'll plan continued screening every 3 years. 4. Mammography 04/2015. Continue with annual mammography. SBE also reviewed. 5. Colonoscopy 2005. Recommend repeat colonoscopy patient will arrange. 6. Health maintenance. No routine blood work done as this is done at Dr. Lanice Shirts office. Follow up 1 year, sooner as needed.    [name], 9:29 AM 04/24/2015

## 2015-04-29 ENCOUNTER — Ambulatory Visit
Admission: RE | Admit: 2015-04-29 | Discharge: 2015-04-29 | Disposition: A | Discharge status: Home / Self Care | Payer: 59 | Source: Ambulatory Visit | Attending: Family Medicine | Admitting: Family Medicine

## 2015-04-29 ENCOUNTER — Encounter: Payer: Self-pay | Admitting: Family Medicine

## 2015-04-29 ENCOUNTER — Ambulatory Visit (INDEPENDENT_AMBULATORY_CARE_PROVIDER_SITE_OTHER): Payer: 59 | Admitting: Family Medicine

## 2015-04-29 VITALS — BP 114/70 | HR 58 | Wt 126.0 lb

## 2015-04-29 DIAGNOSIS — G8929 Other chronic pain: Secondary | ICD-10-CM | POA: Diagnosis not present

## 2015-04-29 DIAGNOSIS — M79644 Pain in right finger(s): Secondary | ICD-10-CM | POA: Diagnosis not present

## 2015-04-29 DIAGNOSIS — M549 Dorsalgia, unspecified: Secondary | ICD-10-CM

## 2015-04-29 MED ORDER — DICLOFENAC SODIUM 1 % TD GEL: 2.0000 g | Freq: Four times a day (QID) | TRANSDERMAL | Status: DC

## 2015-04-29 NOTE — Progress Notes (Signed)
   Subjective:    Patient ID: Bethany [name], female    DOB: 26-Apr-1956, 59 y.o.   MRN: [id number]  HPI She is here for evaluation of right index finger pain that has been going on for at least a month. It is now interfering with her ability to function including grasping and lifting things. She presently is on Naprosyn for chronic back pain. No other joints are involved.   Review of Systems     Objective:   Physical Exam Slight discomfort with palpation of the MCP joint,Slight ridging is noted, no swelling noted of any joints in the hands or fingers. Also wrists elbows and knee show no swelling.       Assessment & Plan:  Finger pain, right - Plan: DG Hand Complete Right, Ambulatory referral to Orthopedic Surgery, diclofenac sodium (VOLTAREN) 1 % GEL  Chronic back pain I will try her on Voltaren cream first but she would like a referral to orthopedics just to be safe. Explained that if the Voltaren works, topical would certainly be a reasonable way to approach taking care of this.

## 2015-05-23 ENCOUNTER — Ambulatory Visit (INDEPENDENT_AMBULATORY_CARE_PROVIDER_SITE_OTHER): Payer: 59 | Admitting: Women's Health

## 2015-05-23 ENCOUNTER — Other Ambulatory Visit: Payer: Self-pay | Admitting: Orthopedic Surgery

## 2015-05-23 ENCOUNTER — Encounter: Payer: Self-pay | Admitting: Women's Health

## 2015-05-23 VITALS — BP 124/78 | Ht 65.0 in | Wt 127.0 lb

## 2015-05-23 DIAGNOSIS — N3000 Acute cystitis without hematuria: Secondary | ICD-10-CM

## 2015-05-23 DIAGNOSIS — S6991XA Unspecified injury of right wrist, hand and finger(s), initial encounter: Secondary | ICD-10-CM

## 2015-05-23 LAB — URINALYSIS W MICROSCOPIC + REFLEX CULTURE
Bilirubin Urine: NEGATIVE
CASTS: NONE SEEN
Crystals: NONE SEEN
Glucose, UA: 100 mg/dL — AB
Hgb urine dipstick: NEGATIVE
Ketones, ur: NEGATIVE mg/dL
Nitrite: POSITIVE — AB
Protein, ur: 30 mg/dL — AB
RBC / HPF: NONE SEEN RBC/hpf (ref <3)
SPECIFIC GRAVITY, URINE: 1.01 (ref 1.005–1.030)
Urobilinogen, UA: 2 mg/dL — ABNORMAL HIGH (ref 0.0–1.0)
WBC, UA: NONE SEEN WBC/hpf (ref <3)
pH: 7 (ref 5.0–8.0)

## 2015-05-23 MED ORDER — SULFAMETHOXAZOLE-TRIMETHOPRIM 800-160 MG PO TABS: 1.0000 | ORAL_TABLET | Freq: Two times a day (BID) | ORAL | Status: DC

## 2015-05-23 NOTE — Progress Notes (Signed)
Patient ID: Bethany [name], female   DOB: 31-May-1956, 58 y.o.   MRN: [id number] Presents with complaint of pain, burning, frequency with urgency 1 day, reports last UTI greater than 20 years ago. Started Azo today with minimal relief. Denies vaginal discharge or itching. Abdominal pain or fever. TVH with BSO on estrogen patch.  Exam: Appears well. No CVAT. Abdomen soft nontender, external genitalia within normal limits no  erythema, speculum exam no discharge or erythema. UA: Large leukocytes, no wbc's, rare bacteria.  Early onset UTI  Plan: Urine culture pending. Septra twice daily for 3 days #6 prescription, proper use given and reviewed. Instructed to call if no relief of symptoms. UTI prevention discussed.

## 2015-05-23 NOTE — Patient Instructions (Signed)

## 2015-05-24 LAB — URINE CULTURE
Colony Count: NO GROWTH
Organism ID, Bacteria: NO GROWTH

## 2015-06-07 ENCOUNTER — Ambulatory Visit
Admission: RE | Admit: 2015-06-07 | Discharge: 2015-06-07 | Disposition: A | Discharge status: Home / Self Care | Payer: 59 | Source: Ambulatory Visit | Attending: Orthopedic Surgery | Admitting: Orthopedic Surgery

## 2015-06-07 DIAGNOSIS — S6991XA Unspecified injury of right wrist, hand and finger(s), initial encounter: Secondary | ICD-10-CM

## 2015-06-07 MED ORDER — IOHEXOL 180 MG/ML  SOLN
1.0000 mL | Freq: Once | INTRAMUSCULAR | Status: AC | PRN
  Administered 2015-06-07: 1 mL via INTRA_ARTICULAR

## 2015-08-06 ENCOUNTER — Other Ambulatory Visit: Payer: Self-pay | Admitting: Orthopedic Surgery

## 2015-08-12 ENCOUNTER — Telehealth: Payer: Self-pay | Admitting: Family Medicine

## 2015-08-12 NOTE — Telephone Encounter (Signed)
Left message with a person. Pt needs to schedule a CPE. Her last one was 09/2014.

## 2015-09-03 ENCOUNTER — Encounter: Payer: Self-pay | Admitting: Family Medicine

## 2015-09-13 ENCOUNTER — Encounter: Payer: 59 | Admitting: Family Medicine

## 2015-09-18 ENCOUNTER — Encounter (HOSPITAL_BASED_OUTPATIENT_CLINIC_OR_DEPARTMENT_OTHER): Payer: Self-pay | Admitting: *Deleted

## 2015-09-24 ENCOUNTER — Ambulatory Visit (HOSPITAL_BASED_OUTPATIENT_CLINIC_OR_DEPARTMENT_OTHER): Payer: 59 | Admitting: Anesthesiology

## 2015-09-24 ENCOUNTER — Encounter (HOSPITAL_BASED_OUTPATIENT_CLINIC_OR_DEPARTMENT_OTHER): Payer: Self-pay | Admitting: *Deleted

## 2015-09-24 ENCOUNTER — Ambulatory Visit (HOSPITAL_BASED_OUTPATIENT_CLINIC_OR_DEPARTMENT_OTHER)
Admission: RE | Admit: 2015-09-24 | Discharge: 2015-09-24 | Disposition: A | Discharge status: Home / Self Care | Payer: 59 | Source: Ambulatory Visit | Attending: Orthopedic Surgery | Admitting: Orthopedic Surgery

## 2015-09-24 ENCOUNTER — Encounter (HOSPITAL_BASED_OUTPATIENT_CLINIC_OR_DEPARTMENT_OTHER)
Admission: RE | Disposition: A | Discharge status: Home / Self Care | Payer: Self-pay | Source: Ambulatory Visit | Attending: Orthopedic Surgery

## 2015-09-24 DIAGNOSIS — Z90710 Acquired absence of both cervix and uterus: Secondary | ICD-10-CM | POA: Diagnosis not present

## 2015-09-24 DIAGNOSIS — A6000 Herpesviral infection of urogenital system, unspecified: Secondary | ICD-10-CM | POA: Insufficient documentation

## 2015-09-24 DIAGNOSIS — Z79899 Other long term (current) drug therapy: Secondary | ICD-10-CM | POA: Diagnosis not present

## 2015-09-24 DIAGNOSIS — Y929 Unspecified place or not applicable: Secondary | ICD-10-CM | POA: Diagnosis not present

## 2015-09-24 DIAGNOSIS — X58XXXA Exposure to other specified factors, initial encounter: Secondary | ICD-10-CM | POA: Diagnosis not present

## 2015-09-24 DIAGNOSIS — M8580 Other specified disorders of bone density and structure, unspecified site: Secondary | ICD-10-CM | POA: Diagnosis not present

## 2015-09-24 DIAGNOSIS — S63410A Traumatic rupture of collateral ligament of right index finger at metacarpophalangeal and interphalangeal joint, initial encounter: Secondary | ICD-10-CM | POA: Insufficient documentation

## 2015-09-24 DIAGNOSIS — B001 Herpesviral vesicular dermatitis: Secondary | ICD-10-CM | POA: Insufficient documentation

## 2015-09-24 DIAGNOSIS — I7300 Raynaud's syndrome without gangrene: Secondary | ICD-10-CM | POA: Insufficient documentation

## 2015-09-24 DIAGNOSIS — Z87891 Personal history of nicotine dependence: Secondary | ICD-10-CM | POA: Insufficient documentation

## 2015-09-24 DIAGNOSIS — F419 Anxiety disorder, unspecified: Secondary | ICD-10-CM | POA: Insufficient documentation

## 2015-09-24 DIAGNOSIS — K589 Irritable bowel syndrome without diarrhea: Secondary | ICD-10-CM | POA: Insufficient documentation

## 2015-09-24 DIAGNOSIS — E785 Hyperlipidemia, unspecified: Secondary | ICD-10-CM | POA: Diagnosis not present

## 2015-09-24 HISTORY — PX: LIGAMENT REPAIR: SHX5444

## 2015-09-24 SURGERY — REPAIR, LIGAMENT
Anesthesia: General | Site: Finger | Laterality: Right

## 2015-09-24 MED ORDER — GLYCOPYRROLATE 0.2 MG/ML IJ SOLN: 0.2000 mg | Freq: Once | INTRAMUSCULAR | Status: DC | PRN

## 2015-09-24 MED ORDER — CHLORHEXIDINE GLUCONATE 4 % EX LIQD: 60.0000 mL | Freq: Once | CUTANEOUS | Status: DC

## 2015-09-24 MED ORDER — ONDANSETRON HCL 4 MG/2ML IJ SOLN
INTRAMUSCULAR | Status: AC
  Filled 2015-09-24: qty 2

## 2015-09-24 MED ORDER — OXYCODONE HCL 5 MG/5ML PO SOLN
5.0000 mg | Freq: Once | ORAL | Status: AC | PRN
Start: 2015-09-24 — End: 2015-09-24

## 2015-09-24 MED ORDER — HYDROMORPHONE HCL 1 MG/ML IJ SOLN
INTRAMUSCULAR | Status: AC
  Filled 2015-09-24: qty 1

## 2015-09-24 MED ORDER — DEXAMETHASONE SODIUM PHOSPHATE 10 MG/ML IJ SOLN
INTRAMUSCULAR | Status: AC
  Filled 2015-09-24: qty 1

## 2015-09-24 MED ORDER — BUPIVACAINE HCL (PF) 0.25 % IJ SOLN
INTRAMUSCULAR | Status: DC | PRN
  Administered 2015-09-24: 4 mL

## 2015-09-24 MED ORDER — PROPOFOL 10 MG/ML IV BOLUS
INTRAVENOUS | Status: DC | PRN
  Administered 2015-09-24: 200 mg via INTRAVENOUS

## 2015-09-24 MED ORDER — FENTANYL CITRATE (PF) 100 MCG/2ML IJ SOLN
INTRAMUSCULAR | Status: AC
  Filled 2015-09-24: qty 2

## 2015-09-24 MED ORDER — SCOPOLAMINE 1 MG/3DAYS TD PT72: 1.0000 | MEDICATED_PATCH | Freq: Once | TRANSDERMAL | Status: DC | PRN

## 2015-09-24 MED ORDER — LACTATED RINGERS IV SOLN
INTRAVENOUS | Status: DC
  Administered 2015-09-24 (×2): via INTRAVENOUS

## 2015-09-24 MED ORDER — KETOROLAC TROMETHAMINE 30 MG/ML IJ SOLN
INTRAMUSCULAR | Status: AC
  Filled 2015-09-24: qty 1

## 2015-09-24 MED ORDER — OXYCODONE HCL 5 MG PO TABS
5.0000 mg | ORAL_TABLET | Freq: Once | ORAL | Status: AC | PRN
  Administered 2015-09-24: 5 mg via ORAL

## 2015-09-24 MED ORDER — PROMETHAZINE HCL 25 MG/ML IJ SOLN: 6.2500 mg | INTRAMUSCULAR | Status: DC | PRN

## 2015-09-24 MED ORDER — OXYCODONE HCL 5 MG PO TABS
ORAL_TABLET | ORAL | Status: AC
  Filled 2015-09-24: qty 1

## 2015-09-24 MED ORDER — LIDOCAINE HCL (CARDIAC) 20 MG/ML IV SOLN
INTRAVENOUS | Status: AC
  Filled 2015-09-24: qty 5

## 2015-09-24 MED ORDER — CEFAZOLIN SODIUM-DEXTROSE 2-3 GM-% IV SOLR
2.0000 g | INTRAVENOUS | Status: AC
  Administered 2015-09-24: 2 g via INTRAVENOUS

## 2015-09-24 MED ORDER — DEXAMETHASONE SODIUM PHOSPHATE 10 MG/ML IJ SOLN
INTRAMUSCULAR | Status: DC | PRN
Start: 2015-09-24 — End: 2015-09-24
  Administered 2015-09-24: 10 mg via INTRAVENOUS

## 2015-09-24 MED ORDER — CEFAZOLIN SODIUM-DEXTROSE 2-3 GM-% IV SOLR
INTRAVENOUS | Status: AC
  Filled 2015-09-24: qty 50

## 2015-09-24 MED ORDER — FENTANYL CITRATE (PF) 100 MCG/2ML IJ SOLN
50.0000 ug | INTRAMUSCULAR | Status: AC | PRN
  Administered 2015-09-24: 25 ug via INTRAVENOUS
  Administered 2015-09-24: 50 ug via INTRAVENOUS
  Administered 2015-09-24: 25 ug via INTRAVENOUS

## 2015-09-24 MED ORDER — MIDAZOLAM HCL 2 MG/2ML IJ SOLN
INTRAMUSCULAR | Status: AC
  Filled 2015-09-24: qty 2

## 2015-09-24 MED ORDER — LIDOCAINE HCL (CARDIAC) 20 MG/ML IV SOLN
INTRAVENOUS | Status: DC | PRN
  Administered 2015-09-24: 60 mg via INTRAVENOUS

## 2015-09-24 MED ORDER — OXYCODONE-ACETAMINOPHEN 5-325 MG PO TABS: ORAL_TABLET | ORAL | Status: DC

## 2015-09-24 MED ORDER — ONDANSETRON HCL 4 MG/2ML IJ SOLN
INTRAMUSCULAR | Status: DC | PRN
  Administered 2015-09-24: 4 mg via INTRAVENOUS

## 2015-09-24 MED ORDER — KETOROLAC TROMETHAMINE 30 MG/ML IJ SOLN
30.0000 mg | Freq: Once | INTRAMUSCULAR | Status: AC
  Administered 2015-09-24: 30 mg via INTRAVENOUS

## 2015-09-24 MED ORDER — FENTANYL CITRATE (PF) 100 MCG/2ML IJ SOLN
25.0000 ug | INTRAMUSCULAR | Status: DC | PRN
  Administered 2015-09-24: 25 ug via INTRAVENOUS
  Administered 2015-09-24: 50 ug via INTRAVENOUS
  Administered 2015-09-24: 25 ug via INTRAVENOUS
  Administered 2015-09-24: 50 ug via INTRAVENOUS

## 2015-09-24 MED ORDER — EPHEDRINE SULFATE 50 MG/ML IJ SOLN
INTRAMUSCULAR | Status: DC | PRN
  Administered 2015-09-24: 10 mg via INTRAVENOUS

## 2015-09-24 MED ORDER — MIDAZOLAM HCL 2 MG/2ML IJ SOLN
1.0000 mg | INTRAMUSCULAR | Status: DC | PRN
  Administered 2015-09-24: 2 mg via INTRAVENOUS

## 2015-09-24 MED ORDER — HYDROMORPHONE HCL 1 MG/ML IJ SOLN
0.2500 mg | INTRAMUSCULAR | Status: DC | PRN
  Administered 2015-09-24 (×2): 0.5 mg via INTRAVENOUS

## 2015-09-24 SURGICAL SUPPLY — 71 items
ANCH SUT 3-0 MN 1 LD NDL (Anchor) ×1 IMPLANT
ANCHOR JUGGERKNOT 1.0 3-0 NLD (Anchor) ×1 IMPLANT
BANDAGE ELASTIC 3 VELCRO ST LF (GAUZE/BANDAGES/DRESSINGS) ×1 IMPLANT
BLADE MINI RND TIP GREEN BEAV (BLADE) ×2 IMPLANT
BLADE SURG 15 STRL LF DISP TIS (BLADE) ×2 IMPLANT
BLADE SURG 15 STRL SS (BLADE) ×4
BNDG CMPR 9X4 STRL LF SNTH (GAUZE/BANDAGES/DRESSINGS) ×1
BNDG CMPR MD 5X2 ELC HKLP STRL (GAUZE/BANDAGES/DRESSINGS)
BNDG ELASTIC 2 VLCR STRL LF (GAUZE/BANDAGES/DRESSINGS) IMPLANT
BNDG ESMARK 4X9 LF (GAUZE/BANDAGES/DRESSINGS) ×2 IMPLANT
BNDG GAUZE ELAST 4 BULKY (GAUZE/BANDAGES/DRESSINGS) ×2 IMPLANT
CATH ROBINSON RED A/P 8FR (CATHETERS) IMPLANT
CHLORAPREP W/TINT 26ML (MISCELLANEOUS) ×2 IMPLANT
CORDS BIPOLAR (ELECTRODE) ×2 IMPLANT
COVER BACK TABLE 60X90IN (DRAPES) ×2 IMPLANT
COVER MAYO STAND STRL (DRAPES) ×2 IMPLANT
CUFF TOURNIQUET SINGLE 18IN (TOURNIQUET CUFF) ×2 IMPLANT
DECANTER SPIKE VIAL GLASS SM (MISCELLANEOUS) IMPLANT
DRAPE EXTREMITY T 121X128X90 (DRAPE) ×2 IMPLANT
DRAPE OEC MINIVIEW 54X84 (DRAPES) IMPLANT
DRAPE SURG 17X23 STRL (DRAPES) ×2 IMPLANT
DRSG PAD ABDOMINAL 8X10 ST (GAUZE/BANDAGES/DRESSINGS) IMPLANT
GAUZE SPONGE 4X4 12PLY STRL (GAUZE/BANDAGES/DRESSINGS) ×2 IMPLANT
GAUZE XEROFORM 1X8 LF (GAUZE/BANDAGES/DRESSINGS) ×2 IMPLANT
GLOVE BIO SURGEON STRL SZ 6.5 (GLOVE) ×1 IMPLANT
GLOVE BIO SURGEON STRL SZ7.5 (GLOVE) ×2 IMPLANT
GLOVE BIOGEL PI IND STRL 7.0 (GLOVE) IMPLANT
GLOVE BIOGEL PI IND STRL 8 (GLOVE) ×1 IMPLANT
GLOVE BIOGEL PI INDICATOR 7.0 (GLOVE) ×1
GLOVE BIOGEL PI INDICATOR 8 (GLOVE) ×1
GLOVE EXAM NITRILE PF LG BLUE (GLOVE) ×1 IMPLANT
GLOVE SURG ORTHO 8.0 STRL STRW (GLOVE) ×1 IMPLANT
GOWN STRL REUS W/ TWL LRG LVL3 (GOWN DISPOSABLE) ×1 IMPLANT
GOWN STRL REUS W/TWL LRG LVL3 (GOWN DISPOSABLE) ×2
GOWN STRL REUS W/TWL XL LVL3 (GOWN DISPOSABLE) ×2 IMPLANT
K-WIRE .035X4 (WIRE) IMPLANT
NDL HYPO 25X1 1.5 SAFETY (NEEDLE) IMPLANT
NDL KEITH (NEEDLE) IMPLANT
NDL SAFETY ECLIPSE 18X1.5 (NEEDLE) IMPLANT
NEEDLE HYPO 18GX1.5 SHARP (NEEDLE)
NEEDLE HYPO 25X1 1.5 SAFETY (NEEDLE) ×2 IMPLANT
NEEDLE KEITH (NEEDLE) IMPLANT
NS IRRIG 1000ML POUR BTL (IV SOLUTION) ×2 IMPLANT
PACK BASIN DAY SURGERY FS (CUSTOM PROCEDURE TRAY) ×2 IMPLANT
PAD CAST 3X4 CTTN HI CHSV (CAST SUPPLIES) ×1 IMPLANT
PAD CAST 4YDX4 CTTN HI CHSV (CAST SUPPLIES) IMPLANT
PADDING CAST ABS 4INX4YD NS (CAST SUPPLIES) ×1
PADDING CAST ABS COTTON 4X4 ST (CAST SUPPLIES) ×1 IMPLANT
PADDING CAST COTTON 3X4 STRL (CAST SUPPLIES) ×2
PADDING CAST COTTON 4X4 STRL (CAST SUPPLIES)
PASSER SUT SWANSON 36MM LOOP (INSTRUMENTS) IMPLANT
SLEEVE SCD COMPRESS KNEE MED (MISCELLANEOUS) ×1 IMPLANT
SPLINT PLASTER CAST XFAST 3X15 (CAST SUPPLIES) ×1 IMPLANT
SPLINT PLASTER XTRA FASTSET 3X (CAST SUPPLIES) ×1
STOCKINETTE 4X48 STRL (DRAPES) ×2 IMPLANT
SUT CHROMIC 4 0 P 3 18 (SUTURE) ×1 IMPLANT
SUT ETHIBOND 3-0 V-5 (SUTURE) IMPLANT
SUT ETHILON 3 0 PS 1 (SUTURE) IMPLANT
SUT ETHILON 4 0 PS 2 18 (SUTURE) ×1 IMPLANT
SUT FIBERWIRE 2-0 18 17.9 3/8 (SUTURE)
SUT MERSILENE 2.0 SH NDLE (SUTURE) IMPLANT
SUT MERSILENE 4 0 P 3 (SUTURE) ×1 IMPLANT
SUT MERSILENE 6 0 P 1 (SUTURE) IMPLANT
SUT SILK 4 0 PS 2 (SUTURE) IMPLANT
SUT VIC AB 0 SH 27 (SUTURE) IMPLANT
SUT VICRYL 4-0 PS2 18IN ABS (SUTURE) IMPLANT
SUTURE FIBERWR 2-0 18 17.9 3/8 (SUTURE) IMPLANT
SYR BULB 3OZ (MISCELLANEOUS) ×2 IMPLANT
SYR CONTROL 10ML LL (SYRINGE) ×1 IMPLANT
TOWEL OR 17X24 6PK STRL BLUE (TOWEL DISPOSABLE) ×4 IMPLANT
UNDERPAD 30X30 (UNDERPADS AND DIAPERS) ×2 IMPLANT

## 2015-09-24 NOTE — Transfer of Care (Signed)
Immediate Anesthesia Transfer of Care Note  Patient: Bethany [name]  Procedure(s) Performed: Procedure(s): RIGHT INDEX METACARPAL PHALANGEAL RADIAL COLLATERAL LIGAMENT REPAIR (Right)  Patient Location: PACU  Anesthesia Type:General  Level of Consciousness: awake and patient cooperative  Airway & Oxygen Therapy: Patient Spontanous Breathing and Patient connected to face mask oxygen  Post-op Assessment: Report given to RN and Post -op Vital signs reviewed and stable  Post vital signs: Reviewed and stable  Last Vitals:  Filed Vitals:   09/24/15 1125  BP: 139/71  Pulse: 58  Temp: 36.4 C  Resp: 16    Complications: No apparent anesthesia complications

## 2015-09-24 NOTE — Brief Op Note (Signed)
09/24/2015  2:23 PM  PATIENT:  Bethany [name]  59 y.o. female  PRE-OPERATIVE DIAGNOSIS:  RIGHT INDEX MIDDLE PLALANX RADIAL COLLATERAL LIGAMENT TEAR  POST-OPERATIVE DIAGNOSIS:  RIGHT INDEX MIDDLE PLALANX RADIAL COLLATERAL ligament tear  PROCEDURE:  Procedure(s): RIGHT INDEX METACARPAL PHALANGEAL RADIAL COLLATERAL LIGAMENT REPAIR (Right)  SURGEON:  Surgeon(s) and Role:    * Leanora Cover, MD - Primary    * Daryll Brod, MD - Assisting  PHYSICIAN ASSISTANT:   ASSISTANTS: Daryll Brod, MD   ANESTHESIA:   general  EBL:  Total I/O In: 1300 [I.V.:1300] Out: -   BLOOD ADMINISTERED:none  DRAINS: none   LOCAL MEDICATIONS USED:  MARCAINE     SPECIMEN:  No Specimen  DISPOSITION OF SPECIMEN:  N/A  COUNTS:  YES  TOURNIQUET:   Total Tourniquet Time Documented: Upper Arm (Right) - 34 minutes Total: Upper Arm (Right) - 34 minutes   DICTATION: .Other Dictation: Dictation Number [id number]  PLAN OF CARE: Discharge to home after PACU  PATIENT DISPOSITION:  PACU - hemodynamically stable.

## 2015-09-24 NOTE — Anesthesia Procedure Notes (Signed)
Procedure Name: LMA Insertion Date/Time: 09/24/2015 1:32 PM Performed by: Deretha Ertle D Pre-anesthesia Checklist: Patient identified, Emergency Drugs available, Suction available and Patient being monitored Patient Re-evaluated:Patient Re-evaluated prior to inductionOxygen Delivery Method: Circle System Utilized Preoxygenation: Pre-oxygenation with 100% oxygen Intubation Type: IV induction Ventilation: Mask ventilation without difficulty LMA: LMA inserted LMA Size: 4.0 Number of attempts: 1 Airway Equipment and Method: Bite block Placement Confirmation: positive ETCO2 Tube secured with: Tape Dental Injury: Teeth and Oropharynx as per pre-operative assessment

## 2015-09-24 NOTE — Op Note (Signed)
[id number] 

## 2015-09-24 NOTE — Op Note (Signed)
[name], [name]              ACCOUNT NO.:  [id number]  MEDICAL RECORD NO.:  [id number]  LOCATION:                                 FACILITY:  PHYSICIAN:  Leanora Cover, MD             DATE OF BIRTH:  DATE OF PROCEDURE:  09/24/2015 DATE OF DISCHARGE:                              OPERATIVE REPORT   PREOPERATIVE DIAGNOSIS:  Right index finger right radial collateral ligament tear.  POSTOPERATIVE DIAGNOSIS:  Right index finger right radial collateral ligament tear.  PROCEDURE:  Right index finger radial collateral ligament repair.  SURGEON:  Leanora Cover, MD.  ASSISTANT:  Daryll Brod, MD.  ANESTHESIA:  General.  IV FLUIDS:  Per anesthesia flow sheet.  ESTIMATED BLOOD LOSS:  Minimal.  COMPLICATIONS:  None.  SPECIMENS:  None.  TOURNIQUET TIME:  34 minutes.  DISPOSITION:  Stable to PACU.  INDICATIONS:  Bethany [name] is a 59 year old female who has had pain in the right index finger at the MP joint for approximately 5 months.  This has been bothersome to her.  She notes pain with gripping and pinching especially.  MRI shows increased fluid coursing deep to the collateral ligament at the metacarpal head on the radial side.  I recommended surgical repair.  Risks, benefits, and alternatives of surgery were discussed including risk of blood loss; infection; damage to nerves, vessels, tendons, ligaments, bone; failure of surgery; need for additional surgery; complications with wound healing, continued pain, and stiffness.  She voiced understanding of these risks and elected to proceed.  OPERATIVE COURSE:  After being identified preoperatively by myself, the patient and I agreed upon procedure and site procedure.  Surgical site was marked.  The risks, benefits, and alternatives of surgery were reviewed and she wished to proceed.  Surgical consent was signed.  She was given IV Ancef as preoperative antibiotic prophylaxis.  She was transported the operative room and placed on the  operating table in supine position with the right upper extremity on arm board.  General anesthesia was induced by Anesthesiology.  Right upper extremity was prepped and draped in normal sterile orthopedic fashion.  Surgical pause was performed between surgeons, anesthesia, and operating room staff and all were in agreement as to the patient, procedure, site of procedure. Tourniquet at the proximal aspect of the extremity was inflated to 250 mmHg.  After exsanguination of the limb, we placed the Esmarch bandage. Incision made at the radial side of the index finger and carried into subcutaneous tissues by spreading technique.  The sagittal band was incised sharply with a Beaver blade.  The capsule was opened.  The radial collateral ligament was identified.  It was incompetent at the insertion at the metacarpal head.  The insertion point was cleared. There was some patulous healing tissue which was debrided.  The area was roughened up.  The joint was inspected.  The articular cartilage appeared intact.  The insertion point of the metacarpal head was roughened with the curette.  A JuggerKnot suture was placed in the radial collateral ligament suture back down to its origin.  The finger was held in 45 degrees of flexion during this to aid in  appropriate tensioning.  The suture was tied and the tension checked which seemed appropriate.  The finger was able to come into full flexion.  The wound was copiously irrigated with sterile saline.  The edges of the ligament were repaired back down with a 5-0 chromic gut suture in a running fashion.  The sagittal band incision was closed with 4-0 Mersilene in a running fashion.  The skin was closed with 4-0 nylon in a horizontal mattress fashion.  The wound was injected with 0.25% plain Marcaine to aid in postoperative analgesia.  It was then dressed with sterile Xeroform, 4x4s, and wrapped with Kerlix bandage.  A volar splint was placed and wrapped with  Kerlix and Ace bandage.  Tourniquet was deflated at 34 minutes.  Fingertips were pink with brisk capillary refill after deflation of tourniquet.  Operative drapes were broken down.  The patient was awoken from anesthesia safely.  She was transferred back to stretcher and taken to PACU in stable condition.  I will see her back in the office in 1 week for postoperative followup.  I will give her Percocet 5/325, 1-2 p.o. q.6 hours p.r.n. pain, dispensed #40.     Leanora Cover, MD     KK/MEDQ  D:  09/24/2015  T:  09/24/2015  Job:  [id number]

## 2015-09-24 NOTE — Discharge Instructions (Addendum)

## 2015-09-24 NOTE — Anesthesia Postprocedure Evaluation (Signed)
Anesthesia Post Note  Patient: Bethany [name]  Procedure(s) Performed: Procedure(s) (LRB): RIGHT INDEX METACARPAL PHALANGEAL RADIAL COLLATERAL LIGAMENT REPAIR (Right)  Patient location during evaluation: PACU Anesthesia Type: General Level of consciousness: awake and alert Pain management: pain level controlled Vital Signs Assessment: post-procedure vital signs reviewed and stable Respiratory status: spontaneous breathing, nonlabored ventilation, respiratory function stable and patient connected to nasal cannula oxygen Cardiovascular status: blood pressure returned to baseline and stable Postop Assessment: No signs of nausea or vomiting Anesthetic complications: no    Last Vitals:  Filed Vitals:   09/24/15 1500 09/24/15 1515  BP: 118/62 113/63  Pulse: 71 70  Temp:    Resp: 12 13    Last Pain:  Filed Vitals:   09/24/15 1521  PainSc: 6                  [name]

## 2015-09-24 NOTE — H&P (Signed)
Bethany [name] is an 59 y.o. female.   Chief Complaint: right index finger rcl injury HPI: 59 yo rhd female with pain in right index mp joint x 5 months.  Pain with pinching and gripping.  MRI shows evidence of rcl injury.  She wishes to proceed with repair/reconstruction right index finger radial collateral ligament.  Past Medical History  Diagnosis Date  . Hypercholesteremia   . Anxiety   . Osteopenia 04/2014    T score -1.7 FRAX 7.3%/0.8% stable from prior DEXA 2013  . Dyslipidemia   . Herpes genitalis   . Herpes labialis   . Raynaud disease   . Cervical dysplasia 1990    CIN 2 subsequent cryosurgery. All Paps normal afterwards  . IBS (irritable bowel syndrome)     Past Surgical History  Procedure Laterality Date  . Breast enhancement surgery  2006  . Tubal ligation    . Vaginal hysterectomy  2005    TVH BSO  adenomyosis  . Upper gastrointestinal endoscopy  04/24/05  . Augmentation mammaplasty    . Oophorectomy      BSO  . Cosmetic surgery    . Gynecologic cryosurgery  1990  . Tonsillectomy and adenoidectomy      Family History  Problem Relation Age of Onset  . Hypertension Mother   . Cancer Father 75    Lymphoma  . Breast cancer Sister 70   Social History:  reports that she quit smoking about 9 years ago. She has never used smokeless tobacco. She reports that she does not drink alcohol or use illicit drugs.  Allergies:  Allergies  Allergen Reactions  . Morphine And Related Nausea Only    Medications Prior to Admission  Medication Sig Dispense Refill  . cholecalciferol (VITAMIN D) 1000 UNITS tablet Take 1,000 Units by mouth daily.    Marland Kitchen gabapentin (NEURONTIN) 600 MG tablet Take 600 mg by mouth. 1 IN THE AM AND 2 IN THE PM.     . ibuprofen (ADVIL,MOTRIN) 800 MG tablet Take 1 tablet (800 mg total) by mouth every 8 (eight) hours as needed. 30 tablet 3  . lamoTRIgine (LAMICTAL) 25 MG tablet Take 1 tablet by mouth daily.  0  . MINIVELLE 0.1 MG/24HR patch apply  1 patch two times a week as directed 4 patch 12  . Multiple Vitamins-Minerals (MULTIVITAMIN WITH MINERALS) tablet Take 1 tablet by mouth daily.    . simvastatin (ZOCOR) 40 MG tablet take 2 tablets by mouth once daily at bedtime 180 tablet 3  . valACYclovir (VALTREX) 1000 MG tablet Take 1 tablet (1,000 mg total) by mouth 2 (two) times daily. 30 tablet 11  . vitamin C (ASCORBIC ACID) 500 MG tablet Take 500 mg by mouth daily.    . diclofenac sodium (VOLTAREN) 1 % GEL Apply 2 g topically 4 (four) times daily. 100 g 11    No results found for this or any previous visit (from the past 48 hour(s)).  No results found.   A comprehensive review of systems was negative except for: Eyes: positive for contacts/glasses Ears, nose, mouth, throat, and face: positive for tinnitus Hematologic/lymphatic: positive for easy bruising Musculoskeletal: positive for back pain  Blood pressure 139/71, pulse 58, temperature 97.5 F (36.4 C), temperature source Oral, resp. rate 16, height 5\' 5"  (1.651 m), weight 55.509 kg (122 lb 6 oz), SpO2 100 %.  General appearance: alert, cooperative and appears stated age Head: Normocephalic, without obvious abnormality, atraumatic Neck: supple, symmetrical, trachea midline Resp: clear to auscultation  bilaterally Cardio: regular rate and rhythm GI: non tender Extremities: intact sensation and capillary refill all digits.  +epl/fpl/io.  no wounds. Pulses: 2+ and symmetric Skin: Skin color, texture, turgor normal. No rashes or lesions Neurologic: Grossly normal Incision/Wound: none  Assessment/Plan Right index radial collateral ligament injury.  Plan repair vs reconstruction right index radial collateral ligament tear.  Risks, benefits, and alternatives of surgery were discussed and the patient agrees with the plan of care.   Sherle Mello R 09/24/2015, 1:23 PM

## 2015-09-24 NOTE — Anesthesia Preprocedure Evaluation (Signed)
Anesthesia Evaluation  Patient identified by MRN, date of birth, ID band Patient awake    Reviewed: Allergy & Precautions, NPO status , Patient's Chart, lab work & pertinent test results  Airway Mallampati: II  TM Distance: >3 FB Neck ROM: Full    Dental no notable dental hx.    Pulmonary neg pulmonary ROS, former smoker,    Pulmonary exam normal breath sounds clear to auscultation       Cardiovascular negative cardio ROS Normal cardiovascular exam Rhythm:Regular Rate:Normal     Neuro/Psych Anxiety negative neurological ROS     GI/Hepatic negative GI ROS, Neg liver ROS,   Endo/Other  negative endocrine ROS  Renal/GU negative Renal ROS  negative genitourinary   Musculoskeletal negative musculoskeletal ROS (+)   Abdominal   Peds negative pediatric ROS (+)  Hematology negative hematology ROS (+)   Anesthesia Other Findings   Reproductive/Obstetrics negative OB ROS                             Anesthesia Physical Anesthesia Plan  ASA: II  Anesthesia Plan: General   Post-op Pain Management:    Induction: Intravenous  Airway Management Planned: LMA  Additional Equipment:   Intra-op Plan:   Post-operative Plan: Extubation in OR  Informed Consent: I have reviewed the patients History and Physical, chart, labs and discussed the procedure including the risks, benefits and alternatives for the proposed anesthesia with the patient or authorized representative who has indicated his/her understanding and acceptance.   Dental advisory given  Plan Discussed with: CRNA and Surgeon  Anesthesia Plan Comments:         Anesthesia Quick Evaluation  

## 2015-09-25 ENCOUNTER — Encounter (HOSPITAL_BASED_OUTPATIENT_CLINIC_OR_DEPARTMENT_OTHER): Payer: Self-pay | Admitting: Orthopedic Surgery

## 2015-09-25 NOTE — Anesthesia Postprocedure Evaluation (Signed)
Anesthesia Post Note  Patient: Bethany [name]  Procedure(s) Performed: Procedure(s) (LRB): RIGHT INDEX METACARPAL PHALANGEAL RADIAL COLLATERAL LIGAMENT REPAIR (Right)  Patient location during evaluation: PACU Anesthesia Type: General Level of consciousness: awake and alert Pain management: pain level controlled Vital Signs Assessment: post-procedure vital signs reviewed and stable Respiratory status: spontaneous breathing, nonlabored ventilation, respiratory function stable and patient connected to nasal cannula oxygen Cardiovascular status: blood pressure returned to baseline and stable Postop Assessment: No signs of nausea or vomiting Anesthetic complications: no    Last Vitals:  Filed Vitals:   09/24/15 1615 09/24/15 1628  BP: 116/65 125/68  Pulse: 69 74  Temp:  37 C  Resp: 12 16    Last Pain:  Filed Vitals:   09/24/15 1700  PainSc: 3                  Somaya Grassi S

## 2016-01-07 ENCOUNTER — Encounter: Payer: Self-pay | Admitting: Family Medicine

## 2016-01-07 ENCOUNTER — Ambulatory Visit (INDEPENDENT_AMBULATORY_CARE_PROVIDER_SITE_OTHER): Payer: 59 | Admitting: Family Medicine

## 2016-01-07 VITALS — BP 122/70 | HR 68 | Ht 65.0 in | Wt 123.0 lb

## 2016-01-07 DIAGNOSIS — K649 Unspecified hemorrhoids: Secondary | ICD-10-CM | POA: Diagnosis not present

## 2016-01-07 DIAGNOSIS — E785 Hyperlipidemia, unspecified: Secondary | ICD-10-CM

## 2016-01-07 DIAGNOSIS — F39 Unspecified mood [affective] disorder: Secondary | ICD-10-CM | POA: Diagnosis not present

## 2016-01-07 DIAGNOSIS — B001 Herpesviral vesicular dermatitis: Secondary | ICD-10-CM | POA: Diagnosis not present

## 2016-01-07 DIAGNOSIS — M549 Dorsalgia, unspecified: Secondary | ICD-10-CM | POA: Diagnosis not present

## 2016-01-07 DIAGNOSIS — G8929 Other chronic pain: Secondary | ICD-10-CM

## 2016-01-07 DIAGNOSIS — Z23 Encounter for immunization: Secondary | ICD-10-CM

## 2016-01-07 DIAGNOSIS — Z1211 Encounter for screening for malignant neoplasm of colon: Secondary | ICD-10-CM

## 2016-01-07 DIAGNOSIS — Z0000 Encounter for general adult medical examination without abnormal findings: Secondary | ICD-10-CM | POA: Diagnosis not present

## 2016-01-07 DIAGNOSIS — Z79890 Hormone replacement therapy (postmenopausal): Secondary | ICD-10-CM | POA: Diagnosis not present

## 2016-01-07 DIAGNOSIS — Z1159 Encounter for screening for other viral diseases: Secondary | ICD-10-CM | POA: Diagnosis not present

## 2016-01-07 DIAGNOSIS — I7300 Raynaud's syndrome without gangrene: Secondary | ICD-10-CM

## 2016-01-07 LAB — CBC WITH DIFFERENTIAL/PLATELET
Basophils Absolute: 0.1 10*3/uL (ref 0.0–0.1)
Basophils Relative: 1 % (ref 0–1)
EOS ABS: 0.1 10*3/uL (ref 0.0–0.7)
EOS PCT: 1 % (ref 0–5)
HCT: 38.1 % (ref 36.0–46.0)
Hemoglobin: 12.7 g/dL (ref 12.0–15.0)
Lymphocytes Relative: 37 % (ref 12–46)
Lymphs Abs: 2.3 10*3/uL (ref 0.7–4.0)
MCH: 32.9 pg (ref 26.0–34.0)
MCHC: 33.3 g/dL (ref 30.0–36.0)
MCV: 98.7 fL (ref 78.0–100.0)
MONO ABS: 0.4 10*3/uL (ref 0.1–1.0)
MPV: 10.8 fL (ref 8.6–12.4)
Monocytes Relative: 7 % (ref 3–12)
Neutro Abs: 3.3 10*3/uL (ref 1.7–7.7)
Neutrophils Relative %: 54 % (ref 43–77)
PLATELETS: 204 10*3/uL (ref 150–400)
RBC: 3.86 MIL/uL — ABNORMAL LOW (ref 3.87–5.11)
RDW: 13.6 % (ref 11.5–15.5)
WBC: 6.2 10*3/uL (ref 4.0–10.5)

## 2016-01-07 LAB — POCT URINALYSIS DIPSTICK
Bilirubin, UA: NEGATIVE
Glucose, UA: NEGATIVE
Ketones, UA: NEGATIVE
Leukocytes, UA: NEGATIVE
Nitrite, UA: NEGATIVE
PH UA: 6
PROTEIN UA: NEGATIVE
RBC UA: NEGATIVE
UROBILINOGEN UA: 4

## 2016-01-07 LAB — COMPREHENSIVE METABOLIC PANEL
ALT: 21 U/L (ref 6–29)
AST: 34 U/L (ref 10–35)
Albumin: 4.1 g/dL (ref 3.6–5.1)
Alkaline Phosphatase: 52 U/L (ref 33–130)
BUN: 18 mg/dL (ref 7–25)
CHLORIDE: 101 mmol/L (ref 98–110)
CO2: 31 mmol/L (ref 20–31)
Calcium: 9.3 mg/dL (ref 8.6–10.4)
Creat: 1.02 mg/dL — ABNORMAL HIGH (ref 0.50–0.99)
Glucose, Bld: 74 mg/dL (ref 65–99)
POTASSIUM: 4.4 mmol/L (ref 3.5–5.3)
Sodium: 138 mmol/L (ref 135–146)
TOTAL PROTEIN: 6.6 g/dL (ref 6.1–8.1)
Total Bilirubin: 0.4 mg/dL (ref 0.2–1.2)

## 2016-01-07 LAB — LIPID PANEL
CHOL/HDL RATIO: 2.3 ratio (ref <=5.0)
Cholesterol: 225 mg/dL — ABNORMAL HIGH (ref 125–200)
HDL: 98 mg/dL (ref >=46)
LDL CALC: 114 mg/dL (ref <130)
Triglycerides: 67 mg/dL (ref <150)
VLDL: 13 mg/dL (ref <30)

## 2016-01-07 MED ORDER — HYDROCORTISONE ACETATE 25 MG RE SUPP: 25.0000 mg | Freq: Two times a day (BID) | RECTAL | Status: DC

## 2016-01-07 MED ORDER — MINIVELLE 0.1 MG/24HR TD PTTW: MEDICATED_PATCH | TRANSDERMAL | Status: DC

## 2016-01-07 MED ORDER — VALACYCLOVIR HCL 1 G PO TABS: 1000.0000 mg | ORAL_TABLET | Freq: Two times a day (BID) | ORAL | Status: DC

## 2016-01-07 NOTE — Addendum Note (Signed)
Addended by: Denita Lung on: 01/07/2016 12:25 PM   Modules accepted: Orders

## 2016-01-07 NOTE — Progress Notes (Signed)
Subjective:    Patient ID: Bethany [name], female    DOB: 1956/01/30, 60 y.o.   MRN: [id number]  HPI She is here for a complete examination. She does have chronic back pain and has had ejections in the past but none recently. She recently ran out of her simvastatin and has not had any in the last 2 months. She has been making dietary changes and would like to see what the results are. She does have an underlying mood disorder and presently is on medications for this. She is being followed by Dr. Edgar Frisk and is quite happy with her present state of affairs. She does have Raynaud's disease however stopped taking the calcium blocker as it was causing difficulty. She would like to consider new on HRT as it does help reduce menopausal type symptoms. She has a history of recurrent herpes labialis and would like a refill on her Valtrex. Occasionally she does have difficulty with hemorrhoids and would like a refill on her Anusol. Her work and home life are going quite well. Family and social history as well as immunizations and health maintenance was reviewed. She has no other concerns or complaints. She does exercise regularly.   Review of Systems  All other systems reviewed and are negative.      Objective:   Physical Exam  BP 122/70 mmHg  Pulse 68  Ht 5\' 5"  (1.651 m)  Wt 123 lb (55.792 kg)  BMI 20.47 kg/m2  General Appearance:    Alert, cooperative, no distress, appears stated age  Head:    Normocephalic, without obvious abnormality, atraumatic  Eyes:    PERRL, conjunctiva/corneas clear, EOM's intact, fundi    benign  Ears:    Normal TM's and external ear canals  Nose:   Nares normal, mucosa normal, no drainage or sinus   tenderness  Throat:   Lips, mucosa, and tongue normal; teeth and gums normal  Neck:   Supple, no lymphadenopathy;  thyroid:  no   enlargement/tenderness/nodules; no carotid   bruit or JVD  Back:    Spine nontender, no curvature, ROM normal, no CVA     tenderness  Lungs:      Clear to auscultation bilaterally without wheezes, rales or     ronchi; respirations unlabored  Chest Wall:    No tenderness or deformity   Heart:    Regular rate and rhythm, S1 and S2 normal, no murmur, rub   or gallop  Breast Exam:    Deferred to GYN  Abdomen:     Soft, non-tender, nondistended, normoactive bowel sounds,    no masses, no hepatosplenomegaly  Genitalia:    Deferred to GYN     Extremities:   No clubbing, cyanosis or edema  Pulses:   2+ and symmetric all extremities  Skin:   Skin color, texture, turgor normal, no rashes or lesions  Lymph nodes:   Cervical, supraclavicular, and axillary nodes normal  Neurologic:   CNII-XII intact, normal strength, sensation and gait; reflexes 2+ and symmetric throughout          Psych:   Normal mood, affect, hygiene and grooming.          Assessment & Plan:  Encounter for wellness examination - Plan: POCT urinalysis dipstick, CBC with Differential/Platelet, Comprehensive metabolic panel, Lipid panel  Need for prophylactic vaccination and inoculation against single disease - Plan: Varicella-zoster vaccine subcutaneous, valACYclovir (VALTREX) 1000 MG tablet  Need for hepatitis C screening test - Plan: Hepatitis C antibody  Chronic  back pain  Mood disorder ([place])  Hyperlipidemia with target LDL less than 100 - Plan: Lipid panel  Recurrent herpes labialis - Plan: valACYclovir (VALTREX) 1000 MG tablet  Hemorrhoids, unspecified hemorrhoid type - Plan: hydrocortisone (ANUSOL-HC) 25 MG suppository  Postmenopausal HRT (hormone replacement therapy) - Plan: MINIVELLE 0.1 MG/24HR patch  Raynaud's disease I encouraged her to continue to take good care of herself. Her medications were renewed. I will wait on the lipid panel before possibly placing her back on a statin.

## 2016-01-08 LAB — HEPATITIS C ANTIBODY: HCV AB: NEGATIVE

## 2016-02-03 ENCOUNTER — Encounter: Payer: Self-pay | Admitting: Gastroenterology

## 2016-03-03 ENCOUNTER — Other Ambulatory Visit: Payer: Self-pay | Admitting: *Deleted

## 2016-03-03 DIAGNOSIS — E785 Hyperlipidemia, unspecified: Secondary | ICD-10-CM

## 2016-03-03 MED ORDER — SIMVASTATIN 40 MG PO TABS: ORAL_TABLET | ORAL | Status: DC

## 2016-03-13 ENCOUNTER — Ambulatory Visit (AMBULATORY_SURGERY_CENTER): Payer: Self-pay

## 2016-03-13 VITALS — Ht 65.0 in | Wt 124.4 lb

## 2016-03-13 DIAGNOSIS — Z1211 Encounter for screening for malignant neoplasm of colon: Secondary | ICD-10-CM

## 2016-03-13 MED ORDER — SUPREP BOWEL PREP KIT 17.5-3.13-1.6 GM/177ML PO SOLN: 1.0000 | Freq: Once | ORAL | Status: DC

## 2016-03-13 NOTE — Progress Notes (Signed)
No allergies to eggs or soy No past problems with anesthesia No diet meds No home oxygen  Has email and internet; declined emmi 

## 2016-03-27 ENCOUNTER — Encounter: Payer: Self-pay | Admitting: Gastroenterology

## 2016-04-10 ENCOUNTER — Ambulatory Visit (AMBULATORY_SURGERY_CENTER): Payer: 59 | Admitting: Gastroenterology

## 2016-04-10 ENCOUNTER — Encounter: Payer: Self-pay | Admitting: Gastroenterology

## 2016-04-10 VITALS — BP 113/60 | HR 55 | Temp 97.7°F | Resp 18 | Wt 124.0 lb

## 2016-04-10 DIAGNOSIS — D122 Benign neoplasm of ascending colon: Secondary | ICD-10-CM

## 2016-04-10 DIAGNOSIS — Z1211 Encounter for screening for malignant neoplasm of colon: Secondary | ICD-10-CM

## 2016-04-10 MED ORDER — SODIUM CHLORIDE 0.9 % IV SOLN: 500.0000 mL | INTRAVENOUS | Status: DC

## 2016-04-10 NOTE — Patient Instructions (Signed)
YOU HAD AN ENDOSCOPIC PROCEDURE TODAY AT [facility]:   Refer to the procedure report that was given to you for any specific questions about what was found during the examination.  If the procedure report does not answer your questions, please call your gastroenterologist to clarify.  If you requested that your care partner not be given the details of your procedure findings, then the procedure report has been included in a sealed envelope for you to review at your convenience later.  YOU SHOULD EXPECT: Some feelings of bloating in the abdomen. Passage of more gas than usual.  Walking can help get rid of the air that was put into your GI tract during the procedure and reduce the bloating. If you had a lower endoscopy (such as a colonoscopy or flexible sigmoidoscopy) you may notice spotting of blood in your stool or on the toilet paper. If you underwent a bowel prep for your procedure, you may not have a normal bowel movement for a few days.  Please Note:  You might notice some irritation and congestion in your nose or some drainage.  This is from the oxygen used during your procedure.  There is no need for concern and it should clear up in a day or so.  SYMPTOMS TO REPORT IMMEDIATELY:   Following lower endoscopy (colonoscopy or flexible sigmoidoscopy):  Excessive amounts of blood in the stool  Significant tenderness or worsening of abdominal pains  Swelling of the abdomen that is new, acute  Fever of 100F or higher    For urgent or emergent issues, a gastroenterologist can be reached at any hour by calling [phone].   DIET: Your first meal following the procedure should be a small meal and then it is ok to progress to your normal diet. Heavy or fried foods are harder to digest and may make you feel nauseous or bloated.  Likewise, meals heavy in dairy and vegetables can increase bloating.  Drink plenty of fluids but you should avoid alcoholic beverages for 24  hours.  ACTIVITY:  You should plan to take it easy for the rest of today and you should NOT DRIVE or use heavy machinery until tomorrow (because of the sedation medicines used during the test).    FOLLOW UP: Our staff will call the number listed on your records the next business day following your procedure to check on you and address any questions or concerns that you may have regarding the information given to you following your procedure. If we do not reach you, we will leave a message.  However, if you are feeling well and you are not experiencing any problems, there is no need to return our call.  We will assume that you have returned to your regular daily activities without incident.  If any biopsies were taken you will be contacted by phone or by letter within the next 1-3 weeks.  Please call us at [phone] if you have not heard about the biopsies in 3 weeks.    SIGNATURES/CONFIDENTIALITY: You and/or your care partner have signed paperwork which will be entered into your electronic medical record.  These signatures attest to the fact that that the information above on your After Visit Summary has been reviewed and is understood.  Full responsibility of the confidentiality of this discharge information lies with you and/or your care-partner.   Information on polyps,hemorrhoids and hemorrhoid banding given to you today

## 2016-04-10 NOTE — Progress Notes (Signed)
Called to room to assist during endoscopic procedure.  Patient ID and intended procedure confirmed with present staff. Received instructions for my participation in the procedure from the performing physician.  

## 2016-04-10 NOTE — Progress Notes (Signed)
Report to PACU, RN, vss, BBS= Clear.  

## 2016-04-10 NOTE — Op Note (Addendum)
[facility] Patient Name: Bethany [name] Procedure Date: 04/10/2016 8:31 AM MRN: [id number] Endoscopist: Mauri Pole , MD Age: 60 Referring MD:  Date of Birth: Mar 19, 1956 Gender: Female Procedure:                Colonoscopy Indications:              Screening for colorectal malignant neoplasm,                            Screening for colorectal malignant neoplasm (last                            colonoscopy was more than 10 years ago) Medicines:                Monitored Anesthesia Care Procedure:                Pre-Anesthesia Assessment:                           - Prior to the procedure, a History and Physical                            was performed, and patient medications and                            allergies were reviewed. The patient's tolerance of                            previous anesthesia was also reviewed. The risks                            and benefits of the procedure and the sedation                            options and risks were discussed with the patient.                            All questions were answered, and informed consent                            was obtained. Prior Anticoagulants: The patient has                            taken no previous anticoagulant or antiplatelet                            agents. ASA Grade Assessment: II - A patient with                            mild systemic disease. After reviewing the risks                            and benefits, the patient was deemed in  satisfactory condition to undergo the procedure.                           After obtaining informed consent, the colonoscope                            was passed under direct vision. Throughout the                            procedure, the patient's blood pressure, pulse, and                            oxygen saturations were monitored continuously. The                            Model CF-HQ190L [id number]) scope was  introduced                            through the anus and advanced to the the terminal                            ileum, with identification of the appendiceal                            orifice and IC valve. The colonoscopy was performed                            without difficulty. The patient tolerated the                            procedure well. The quality of the bowel                            preparation was good. The terminal ileum, ileocecal                            valve, appendiceal orifice, and rectum were                            photographed. Scope In: 8:38:22 AM Scope Out: 8:51:08 AM Scope Withdrawal Time: 0 hours 9 minutes 32 seconds  Total Procedure Duration: 0 hours 12 minutes 46 seconds  Findings:                 The perianal and digital rectal examinations were                            normal.                           A 5 mm polyp was found in the ascending colon. The                            polyp was sessile. The polyp was removed with a  cold snare. Resection and retrieval were complete.                           A diffuse area of moderate melanosis was found in                            the transverse colon, at the hepatic flexure and in                            the ascending colon.                           Non-bleeding internal hemorrhoids were found during                            retroflexion. The hemorrhoids were small. Complications:            No immediate complications. Estimated Blood Loss:     Estimated blood loss: none. Impression:               - One 5 mm polyp in the ascending colon, removed                            with a cold snare. Resected and retrieved.                           - Melanosis in the colon.                           - Non-bleeding internal hemorrhoids. Recommendation:           - Patient has a contact number available for                            emergencies. The signs and symptoms  of potential                            delayed complications were discussed with the                            patient. Return to normal activities tomorrow.                            Written discharge instructions were provided to the                            patient.                           - Resume previous diet.                           - Continue present medications.                           - Await pathology results.                           -  Repeat colonoscopy is recommended for adenoma                            surveillance in 5-10 years. The colonoscopy date                            will be determined after pathology results from                            today's exam become available for review.                           - Return to GI clinic PRN. Mauri Pole, MD 04/10/2016 8:55:56 AM This report has been signed electronically. Addendum Number: 1   Addendum Date: 04/10/2016 10:06:58 AM      Will schedule for hemorrhoidal band ligation for symptomatic hemorrhoids       per patient request Mauri Pole, MD 04/10/2016 10:07:26 AM This report has been signed electronically.

## 2016-04-13 ENCOUNTER — Telehealth: Payer: Self-pay | Admitting: *Deleted

## 2016-04-13 NOTE — Telephone Encounter (Signed)
  Follow up Call-  Call back number 04/10/2016  Post procedure Call Back phone  # [phone]  Permission to leave phone message Yes     Patient questions:  Do you have a fever, pain , or abdominal swelling? No. Pain Score  0 *  Have you tolerated food without any problems? Yes.    Have you been able to return to your normal activities? Yes.    Do you have any questions about your discharge instructions: Diet   No. Medications  No. Follow up visit  No.  Do you have questions or concerns about your Care? No.  Actions: * If pain score is 4 or above: No action needed, pain <4.

## 2016-04-15 ENCOUNTER — Encounter: Payer: Self-pay | Admitting: Gastroenterology

## 2016-04-21 ENCOUNTER — Encounter: Payer: Self-pay | Admitting: Family Medicine

## 2016-04-21 ENCOUNTER — Ambulatory Visit (INDEPENDENT_AMBULATORY_CARE_PROVIDER_SITE_OTHER): Payer: 59 | Admitting: Family Medicine

## 2016-04-21 VITALS — BP 118/70 | HR 74 | Temp 98.3°F | Wt 125.6 lb

## 2016-04-21 DIAGNOSIS — K649 Unspecified hemorrhoids: Secondary | ICD-10-CM

## 2016-04-21 DIAGNOSIS — D126 Benign neoplasm of colon, unspecified: Secondary | ICD-10-CM

## 2016-04-21 DIAGNOSIS — J0100 Acute maxillary sinusitis, unspecified: Secondary | ICD-10-CM | POA: Diagnosis not present

## 2016-04-21 HISTORY — DX: Benign neoplasm of colon, unspecified: D12.6

## 2016-04-21 MED ORDER — AZITHROMYCIN 500 MG PO TABS: 500.0000 mg | ORAL_TABLET | Freq: Every day | ORAL | Status: DC

## 2016-04-21 MED ORDER — HYDROCORTISONE 2.5 % RE CREA: 1.0000 "application " | TOPICAL_CREAM | Freq: Two times a day (BID) | RECTAL | Status: DC

## 2016-04-21 NOTE — Progress Notes (Signed)
   Subjective:    Patient ID: Bethany [name], female    DOB: 1956/05/18, 60 y.o.   MRN: [id number]  HPI She complains of a three-week history this started with postnasal drainage, dry cough as well as sneezing, rhinorrhea. No fever, chills, sore throat or earache. Today she woke up with right maxillary sinus pain and upper tooth discomfort. She does not smoke as no allergies. She would also like a refill on her hemorrhoid med. She has internal hemorrhoids and has banding scheduled in the near future. She recently had a colonoscopy which did show a tubular polyp   Review of Systems     Objective:   Physical Exam Alert and in no distress. Tympanic membranes and canals are normal. Pharyngeal area is normal. Neck is supple without adenopathy or thyromegaly. Cardiac exam shows a regular sinus rhythm without murmurs or gallops. Lungs are clear to auscultation. Nasal mucosa is normal but she does have tenderness over right maxillary sinus      Assessment & Plan:  Acute maxillary sinusitis, recurrence not specified - Plan: azithromycin (ZITHROMAX) 500 MG tablet  Hemorrhoids, unspecified hemorrhoid type - Plan: hydrocortisone (ANUSOL-HC) 2.5 % rectal cream  Tubular adenoma of colon She did not want Augmentin. Anusol given. Also discussed the treatment of internal hemorrhoids and follow up on the polyp

## 2016-04-24 ENCOUNTER — Telehealth: Payer: Self-pay

## 2016-04-24 MED ORDER — AMOXICILLIN-POT CLAVULANATE 875-125 MG PO TABS: 1.0000 | ORAL_TABLET | Freq: Two times a day (BID) | ORAL | Status: DC

## 2016-04-24 NOTE — Telephone Encounter (Signed)
Patient called to ask if you would send in the Augmentin she said she has taken the zpack and continues to feel worse please advise

## 2016-04-24 NOTE — Telephone Encounter (Signed)
i have informed pt

## 2016-04-24 NOTE — Telephone Encounter (Signed)
Let her know that I called it in 

## 2016-04-28 ENCOUNTER — Telehealth: Payer: Self-pay

## 2016-04-28 NOTE — Telephone Encounter (Signed)
Pt informed and verbalized understanding

## 2016-04-28 NOTE — Telephone Encounter (Signed)
Patient called and said she has been taking the Augmentin and her sinus issues feel a lot better but she has bad diarrhea  and wanted to know if she should continue medication or not

## 2016-04-28 NOTE — Telephone Encounter (Signed)
Have her finish the antibiotic and use a probiotic. She can also take some Imodium. Unfortunately this is a side effect of the medicine seems to be working.

## 2016-05-13 ENCOUNTER — Ambulatory Visit (INDEPENDENT_AMBULATORY_CARE_PROVIDER_SITE_OTHER): Payer: 59 | Admitting: Gastroenterology

## 2016-05-13 ENCOUNTER — Encounter: Payer: Self-pay | Admitting: Gastroenterology

## 2016-05-13 VITALS — BP 114/76 | HR 64 | Ht 65.0 in | Wt 122.0 lb

## 2016-05-13 DIAGNOSIS — K641 Second degree hemorrhoids: Secondary | ICD-10-CM

## 2016-05-13 NOTE — Patient Instructions (Signed)

## 2016-05-13 NOTE — Progress Notes (Signed)
PROCEDURE NOTE: The patient presents with symptomatic grade II  hemorrhoids, requesting rubber band ligation of his/her hemorrhoidal disease.  All risks, benefits and alternative forms of therapy were described and informed consent was obtained.  In the Left Lateral Decubitus position anoscopic examination revealed grade II hemorrhoids in the R anterior and posterior position(s).  The anorectum was pre-medicated with 0.125% Nitroglycerine The decision was made to band the R anterior internal hemorrhoid, and the [place] was used to perform band ligation without complication.  Digital anorectal examination was then performed to assure proper positioning of the band, and to adjust the banded tissue as required.  The patient was discharged home without pain or other issues.  Dietary and behavioral recommendations were given and along with follow-up instructions.     The patient will return in 2-4 weeks for  follow-up and possible additional banding as required. No complications were encountered and the patient tolerated the procedure well.  Damaris Hippo , MD [phone] Mon-Fri 8a-5p [phone] after 5p, weekends, holidays

## 2016-07-14 ENCOUNTER — Encounter: Payer: 59 | Admitting: Gastroenterology

## 2016-08-20 ENCOUNTER — Other Ambulatory Visit (INDEPENDENT_AMBULATORY_CARE_PROVIDER_SITE_OTHER): Payer: 59

## 2016-08-20 DIAGNOSIS — Z23 Encounter for immunization: Secondary | ICD-10-CM

## 2016-09-16 ENCOUNTER — Encounter: Payer: Self-pay | Admitting: Family Medicine

## 2016-09-16 ENCOUNTER — Ambulatory Visit (INDEPENDENT_AMBULATORY_CARE_PROVIDER_SITE_OTHER): Payer: 59 | Admitting: Family Medicine

## 2016-09-16 VITALS — BP 110/70 | Temp 97.5°F | Wt 124.0 lb

## 2016-09-16 DIAGNOSIS — R110 Nausea: Secondary | ICD-10-CM

## 2016-09-16 DIAGNOSIS — R109 Unspecified abdominal pain: Secondary | ICD-10-CM

## 2016-09-16 LAB — POCT URINALYSIS DIPSTICK
BILIRUBIN UA: NEGATIVE
Glucose, UA: NEGATIVE
LEUKOCYTES UA: NEGATIVE
NITRITE UA: NEGATIVE
PH UA: 7
PROTEIN UA: POSITIVE
RBC UA: NEGATIVE
Spec Grav, UA: 1.02
UROBILINOGEN UA: 0.2

## 2016-09-16 MED ORDER — HYOSCYAMINE SULFATE SL 0.125 MG SL SUBL: 1.0000 | SUBLINGUAL_TABLET | Freq: Three times a day (TID) | SUBLINGUAL | 0 refills | Status: DC | PRN

## 2016-09-16 MED ORDER — ONDANSETRON HCL 4 MG PO TABS: 4.0000 mg | ORAL_TABLET | Freq: Three times a day (TID) | ORAL | 0 refills | Status: DC | PRN

## 2016-09-16 NOTE — Progress Notes (Signed)
   Subjective:    Patient ID: Bethany [name], female    DOB: Feb 08, 1956, 60 y.o.   MRN: [id number]  HPI She woke up this morning with midepigastric pain that has been intermittent and cramping in nature. She also describes it as a burning stabbing type pain with some nausea and eructation. No vomiting, loose stools, urinary symptoms. She ate crackers today which made no difference. She did try Mylanta with minimal results.   Review of Systems     Objective:   Physical Exam Alert and complaining of abdominal pain. Cardiac and lung exam normal. Abdominal exam shows decreased bowel sounds. Negative Murphy's sign and Murphy's punch. Tender in the midepigastric area and also in the suprapubic area but no rebound. Urine dipstick is negative.       Assessment & Plan:  Nausea without vomiting - Plan: ondansetron (ZOFRAN) 4 MG tablet  Abdominal cramping - Plan: Hyoscyamine Sulfate SL (LEVSIN/SL) 0.125 MG SUBL At this point I will treat her symptoms as the etiology is unclear. She will keep in touch with me. Recommend she eat anything she is comfortable having.

## 2016-10-19 ENCOUNTER — Encounter: Payer: Self-pay | Admitting: Family Medicine

## 2016-11-08 ENCOUNTER — Other Ambulatory Visit: Payer: Self-pay | Admitting: Family Medicine

## 2016-11-08 DIAGNOSIS — K649 Unspecified hemorrhoids: Secondary | ICD-10-CM

## 2016-11-09 NOTE — Telephone Encounter (Signed)
Ok to fill 

## 2016-12-17 ENCOUNTER — Ambulatory Visit (INDEPENDENT_AMBULATORY_CARE_PROVIDER_SITE_OTHER): Payer: 59 | Admitting: Family Medicine

## 2016-12-17 ENCOUNTER — Encounter: Payer: Self-pay | Admitting: Family Medicine

## 2016-12-17 ENCOUNTER — Telehealth: Payer: Self-pay

## 2016-12-17 VITALS — BP 110/64 | Wt 123.0 lb

## 2016-12-17 DIAGNOSIS — M2241 Chondromalacia patellae, right knee: Secondary | ICD-10-CM | POA: Diagnosis not present

## 2016-12-17 DIAGNOSIS — M7631 Iliotibial band syndrome, right leg: Secondary | ICD-10-CM | POA: Diagnosis not present

## 2016-12-17 NOTE — Progress Notes (Signed)
   Subjective:    Patient ID: Bethany [name], female    DOB: 1955-11-10, 61 y.o.   MRN: [id number]  HPI She is here for evaluation of knee pain. It apparently started over a year ago when she noted it mainly with running. Last several months the pain has gotten worse. She did stop her running for proximal weight 1 month but when she started running again she again had knee pain. She points to the lateral aspect of the right knee. There is been no popping, locking or grinding. She notes increased pain as mentioned above when she runs and also going down stairs and sitting. She also notes a when she crosses her legs is very painful. She has a hiking trip in [place] planned for later this summer and wants this taken care of before then.   Review of Systems     Objective:   Physical Exam Right knee exam shows no effusion. No tenderness over the patellar tendon. No joint line tenderness however she is tender along the lateral aspect of her knee in the ITB area.. Medial and lateral collateral ligaments intact. Anterior drawer negative. Ober's test positive. Compression test positive. Also tender to palpation along the lateral aspect of the patella. No crepitus noted. McMurray's testing was slightly uncomfortable laterally and negative on the medial side.       Assessment & Plan:  Chondromalacia, patella, right  Iliotibial band syndrome of right side  Her symptoms and exam are most consistent with ITB and chondromalacia. I explained this to her and demonstrated therapies for this including ITB stretching and terminal extension exercises. She questioned the need for an MRI and possible referral to orthopedics thinking this could be a cartilage. I explained that I did not think it was cartilage and at this point it would be impossible to get an MRI based on her symptoms. Also explained that even if it was cartilage, surgery would not necessarily be the appropriate approach. I explained that another  option to be referral for possible ultrasound to further evaluate this. The referral was made. Over 25 minutes, greater than 50% spent in counseling and coordination of care.

## 2016-12-17 NOTE — Telephone Encounter (Signed)
Patient informed of appointment with Dr.Zack Smith Dec 23 2016  At 11:15 arrival time for 11:30 appointment [place]

## 2016-12-22 NOTE — Progress Notes (Deleted)
[name] [place], [place] [address] Phone: [phone] Subjective:    I'm seeing this patient by the request  of:  Wyatt Haste, MD   CC: Right knee pain  [id number]  Bethany [name] is a 61 y.o. female coming in with complaint of right knee pain. Patient states that she is is seen for greater than one year. Patient states this started with running and now is even hurting her with regular daily activities. Patient states that it seems to be slowly getting worse. Patient trying to discontinue running and then start running again and unfortunately pain came back immediately. States that most the pain is mostly on the lateral aspect of the right knee. Patient states going up and stairs can also significant more pain. Rest seems to help. Patient states that she is planning on going on a hiking trip in 6 months and wants to make sure she is feeling better before then. States that the pain is 6 out of 10 in severity. Patient was told that this is likely patellofemoral syndrome. Was sent here for further evaluation.    Past Medical History:  Diagnosis Date  . Anxiety   . Cervical dysplasia 1990   CIN 2 subsequent cryosurgery. All Paps normal afterwards  . Dyslipidemia   . Herpes genitalis   . Herpes labialis   . Hypercholesteremia   . IBS (irritable bowel syndrome)   . Osteopenia 04/2014   T score -1.7 FRAX 7.3%/0.8% stable from prior DEXA 2013  . Raynaud disease    Past Surgical History:  Procedure Laterality Date  . AUGMENTATION MAMMAPLASTY    . BREAST ENHANCEMENT SURGERY  2006  . COSMETIC SURGERY    . GYNECOLOGIC CRYOSURGERY  1990  . LIGAMENT REPAIR Right 09/24/2015   Procedure: RIGHT INDEX METACARPAL PHALANGEAL RADIAL COLLATERAL LIGAMENT REPAIR;  Surgeon: Leanora Cover, MD;  Location: [facility];  Service: Orthopedics;  Laterality: Right;  . OOPHORECTOMY     BSO  . TONSILLECTOMY AND ADENOIDECTOMY    . TUBAL  LIGATION    . UPPER GASTROINTESTINAL ENDOSCOPY  04/24/05  . VAGINAL HYSTERECTOMY  2005   TVH BSO  adenomyosis   Social History   Social History  . Marital status: Married    Spouse name: N/A  . Number of children: N/A  . Years of education: N/A   Social History Main Topics  . Smoking status: Former Smoker    Quit date: 03/21/2006  . Smokeless tobacco: Never Used  . Alcohol use No     Comment: drinking in 1993  . Drug use: No  . Sexual activity: Yes    Birth control/ protection: Surgical     Comment: HYST-1st intercourse 61 yo-More than 5 partners   Other Topics Concern  . Not on file   Social History Narrative  . No narrative on file   Allergies  Allergen Reactions  . Morphine And Related Nausea Only   Family History  Problem Relation Age of Onset  . Hypertension Mother   . Cancer Father 18    Lymphoma  . Breast cancer Sister 60  . Colon cancer Neg Hx     Past medical history, social, surgical and family history all reviewed in electronic medical record.  No pertanent information unless stated regarding to the chief complaint.   Review of Systems:Review of systems updated and as accurate as of 12/22/16  No headache, visual changes, nausea, vomiting, diarrhea, constipation, dizziness, abdominal pain, skin  rash, fevers, chills, night sweats, weight loss, swollen lymph nodes, body aches, joint swelling, muscle aches, chest pain, shortness of breath, mood changes.   Objective  There were no vitals taken for this visit. Systems examined below as of 12/22/16   General: No apparent distress alert and oriented x3 mood and affect normal, dressed appropriately.  HEENT: Pupils equal, extraocular movements intact  Respiratory: Patient's speak in full sentences and does not appear short of breath  Cardiovascular: No lower extremity edema, non tender, no erythema  Skin: Warm dry intact with no signs of infection or rash on extremities or on axial skeleton.  Abdomen: Soft  nontender  Neuro: Cranial nerves II through XII are intact, neurovascularly intact in all extremities with 2+ DTRs and 2+ pulses.  Lymph: No lymphadenopathy of posterior or anterior cervical chain or axillae bilaterally.  Gait normal with good balance and coordination.  MSK:  Non tender with full range of motion and good stability and symmetric strength and tone of shoulders, elbows, wrist, hip, knee and ankles bilaterally.     Impression and Recommendations:     This case required medical decision making of moderate complexity.      Note: This dictation was prepared with Dragon dictation along with smaller phrase technology. Any transcriptional errors that result from this process are unintentional.

## 2016-12-23 ENCOUNTER — Ambulatory Visit: Payer: 59 | Admitting: Family Medicine

## 2017-01-05 NOTE — Progress Notes (Addendum)
[name] [address] [place], [place] [address] Phone: [phone] Subjective:    I'm seeing this patient by the request  of:  Wyatt Haste, MD   CC: Right knee pain  [id number]  Bethany [name] is a 61 y.o. female coming in with complaint of right knee pain. Patient states that she is is seen for greater than one year. Patient states this started with running and now is even hurting her with regular daily activities. Patient states that it seems to be slowly getting worse. Patient trying to discontinue running and then start running again and unfortunately pain came back immediately. States that most the pain is mostly on the lateral aspect of the right knee. Patient states going up and stairs can also significant more pain. Rest seems to help. Patient states that she is planning on going on a hiking trip in 6 months and wants to make sure she is feeling better before then. States that the pain is 6 out of 10 in severity. Patient was told that this is likely patellofemoral syndrome. Was sent here for further evaluation.    Past Medical History:  Diagnosis Date  . Anxiety   . Cervical dysplasia 1990   CIN 2 subsequent cryosurgery. All Paps normal afterwards  . Dyslipidemia   . Herpes genitalis   . Herpes labialis   . Hypercholesteremia   . IBS (irritable bowel syndrome)   . Osteopenia 04/2014   T score -1.7 FRAX 7.3%/0.8% stable from prior DEXA 2013  . Raynaud disease    Past Surgical History:  Procedure Laterality Date  . AUGMENTATION MAMMAPLASTY    . BREAST ENHANCEMENT SURGERY  2006  . COSMETIC SURGERY    . GYNECOLOGIC CRYOSURGERY  1990  . LIGAMENT REPAIR Right 09/24/2015   Procedure: RIGHT INDEX METACARPAL PHALANGEAL RADIAL COLLATERAL LIGAMENT REPAIR;  Surgeon: Leanora Cover, MD;  Location: [facility];  Service: Orthopedics;  Laterality: Right;  . OOPHORECTOMY     BSO  . TONSILLECTOMY AND ADENOIDECTOMY    . TUBAL  LIGATION    . UPPER GASTROINTESTINAL ENDOSCOPY  04/24/05  . VAGINAL HYSTERECTOMY  2005   TVH BSO  adenomyosis   Social History   Social History  . Marital status: Married    Spouse name: N/A  . Number of children: N/A  . Years of education: N/A   Social History Main Topics  . Smoking status: Former Smoker    Quit date: 03/21/2006  . Smokeless tobacco: Never Used  . Alcohol use No     Comment: drinking in 1993  . Drug use: No  . Sexual activity: Yes    Birth control/ protection: Surgical     Comment: HYST-1st intercourse 61 yo-More than 5 partners   Other Topics Concern  . None   Social History Narrative  . None   Allergies  Allergen Reactions  . Morphine And Related Nausea Only   Family History  Problem Relation Age of Onset  . Hypertension Mother   . Cancer Father 80    Lymphoma  . Breast cancer Sister 36  . Colon cancer Neg Hx     Past medical history, social, surgical and family history all reviewed in electronic medical record.  No pertanent information unless stated regarding to the chief complaint.   Review of Systems:Review of systems updated and as accurate as of 01/06/17  No headache, visual changes, nausea, vomiting, diarrhea, constipation, dizziness, abdominal pain, skin rash, fevers, chills, night sweats,  weight loss, swollen lymph nodes, body aches, joint swelling, muscle aches, chest pain, shortness of breath, mood changes.   Objective  Blood pressure 128/82, pulse 62, height 5\' 5"  (1.651 m), weight 124 lb (56.2 kg). Systems examined below as of 01/06/17   General: No apparent distress alert and oriented x3 mood and affect normal, dressed appropriately.  HEENT: Pupils equal, extraocular movements intact  Respiratory: Patient's speak in full sentences and does not appear short of breath  Cardiovascular: No lower extremity edema, non tender, no erythema  Skin: Warm dry intact with no signs of infection or rash on extremities or on axial skeleton.    Abdomen: Soft nontender  Neuro: Cranial nerves II through XII are intact, neurovascularly intact in all extremities with 2+ DTRs and 2+ pulses.  Lymph: No lymphadenopathy of posterior or anterior cervical chain or axillae bilaterally.  Gait normal with good balance and coordination.  MSK:  Non tender with full range of motion and good stability and symmetric strength and tone of shoulders, elbows, wrist, hip, and ankles bilaterally.  Knee: Right Normal to inspection with no erythema or effusion or obvious bony abnormalities. I'll tenderness over the superior lateral aspect of the patella ROM full in flexion and extension and lower leg rotation. Ligaments with solid consistent endpoints including ACL, PCL, LCL, MCL. Positive Mcmurray's, Apley's, and Thessalonian tests. Mild painful patellar compression. Patellar glide moderate crepitus. Patellar and quadriceps tendons unremarkable. Hamstring and quadriceps strength is normal.    MSK US performed of: Right This study was ordered, performed, and interpreted by Charlann Boxer D.O.  Knee: All structures visualized. Lateral meniscus does have what appears to be a degenerative tear noted. No significant displacement hypoechoic changes still noted. Epoca changes around the distal iliotibial band as well as. Patellar Tendon unremarkable on long and transverse views without effusion. No abnormality of prepatellar bursa. LCL and MCL unremarkable on long and transverse views. No abnormality of origin of medial or lateral head of the gastrocnemius.  IMPRESSION:  Degenerative tear of the lateral meniscus.  Procedure note [id number]; 15 minutes spent for Therapeutic exercises as stated in above notes.  This included exercises focusing on stretching, strengthening, with significant focus on eccentric aspects.  Action extension exercises working on hip abductor strengthening, vastus medialis oblique, as well as isometrics of the hamstring. Proper technique  shown and discussed handout in great detail with ATC.  All questions were discussed and answered.     Impression and Recommendations:     This case required medical decision making of moderate complexity.      Note: This dictation was prepared with Dragon dictation along with smaller phrase technology. Any transcriptional errors that result from this process are unintentional.

## 2017-01-06 ENCOUNTER — Ambulatory Visit: Payer: Self-pay

## 2017-01-06 ENCOUNTER — Ambulatory Visit (INDEPENDENT_AMBULATORY_CARE_PROVIDER_SITE_OTHER): Payer: 59 | Admitting: Family Medicine

## 2017-01-06 ENCOUNTER — Encounter: Payer: Self-pay | Admitting: Family Medicine

## 2017-01-06 VITALS — BP 128/82 | HR 62 | Ht 65.0 in | Wt 124.0 lb

## 2017-01-06 DIAGNOSIS — M25561 Pain in right knee: Secondary | ICD-10-CM

## 2017-01-06 DIAGNOSIS — S83281A Other tear of lateral meniscus, current injury, right knee, initial encounter: Secondary | ICD-10-CM | POA: Diagnosis not present

## 2017-01-06 NOTE — Assessment & Plan Note (Signed)
Patient does have what appears to be a lateral meniscus tear noted. We discussed icing regimen, home exercises, topical anti-inflammatory's prescribed. Patient does have some mild weakness of the right hip that's likely contributing to the instability feeling of the knee. Patient given a brace for the knee and exercises to help with the muscle imbalances. Patient and will come back and see me again in 3 weeks. Worsening symptoms we'll consider injection and formal physical therapy.

## 2017-01-06 NOTE — Patient Instructions (Signed)
Good to see you.  Ice 20 minutes 2 times daily. Usually after activity and before bed. Exercises 3 times a week.  pennsaid pinkie amount topically 2 times daily as needed.  Avoid running or jumping if you can but if running please wear the brace.  Vitamin D 2000 IU dialy  Consider turmeric 500mg  daily for inflammation  See me again in 3 weeks and at that time if not great we will consider injection or physical therapy

## 2017-01-10 ENCOUNTER — Other Ambulatory Visit: Payer: Self-pay | Admitting: Family Medicine

## 2017-01-10 DIAGNOSIS — Z79890 Hormone replacement therapy (postmenopausal): Secondary | ICD-10-CM

## 2017-01-11 NOTE — Telephone Encounter (Signed)
Is this okay to refill? 

## 2017-01-25 NOTE — Progress Notes (Signed)
[name] [address] [place], [place] [address] Phone: [phone] Subjective:    I'm seeing this patient by the request  of:  Wyatt Haste, MD   CC: Right knee pain f/u   [id number]  Chapel Silverthorn Grabel is a 60 y.o. female coming in with complaint of right knee pain. Patient was found to have a lateral meniscal tear as well as patellofemoral syndrome. Patient was to do conservative therapy including home exercises, icing protocol, as well as topical anti-inflammatories. Patient states She was doing significantly better until one day ago. Patient does not rib number exactly what activity but may be one of her working out classes. Patient states that unfortunately having more pain overall. Patient states and somewhat a little bit unstable. Has more of a catching sensation that it was having previously. Denies any swelling though.. Minimal pain with regular daily activities she squats down deeply.    Past Medical History:  Diagnosis Date  . Anxiety   . Cervical dysplasia 1990   CIN 2 subsequent cryosurgery. All Paps normal afterwards  . Dyslipidemia   . Herpes genitalis   . Herpes labialis   . Hypercholesteremia   . IBS (irritable bowel syndrome)   . Osteopenia 04/2014   T score -1.7 FRAX 7.3%/0.8% stable from prior DEXA 2013  . Raynaud disease    Past Surgical History:  Procedure Laterality Date  . AUGMENTATION MAMMAPLASTY    . BREAST ENHANCEMENT SURGERY  2006  . COSMETIC SURGERY    . GYNECOLOGIC CRYOSURGERY  1990  . LIGAMENT REPAIR Right 09/24/2015   Procedure: RIGHT INDEX METACARPAL PHALANGEAL RADIAL COLLATERAL LIGAMENT REPAIR;  Surgeon: Leanora Cover, MD;  Location: [facility];  Service: Orthopedics;  Laterality: Right;  . OOPHORECTOMY     BSO  . TONSILLECTOMY AND ADENOIDECTOMY    . TUBAL LIGATION    . UPPER GASTROINTESTINAL ENDOSCOPY  04/24/05  . VAGINAL HYSTERECTOMY  2005   TVH BSO  adenomyosis   Social History     Social History  . Marital status: Married    Spouse name: N/A  . Number of children: N/A  . Years of education: N/A   Social History Main Topics  . Smoking status: Former Smoker    Quit date: 03/21/2006  . Smokeless tobacco: Never Used  . Alcohol use No     Comment: drinking in 1993  . Drug use: No  . Sexual activity: Yes    Birth control/ protection: Surgical     Comment: HYST-1st intercourse 61 yo-More than 5 partners   Other Topics Concern  . None   Social History Narrative  . None   Allergies  Allergen Reactions  . Morphine And Related Nausea Only   Family History  Problem Relation Age of Onset  . Hypertension Mother   . Cancer Father 73    Lymphoma  . Breast cancer Sister 70  . Colon cancer Neg Hx     Past medical history, social, surgical and family history all reviewed in electronic medical record.  No pertanent information unless stated regarding to the chief complaint.   Review of Systems: No headache, visual changes, nausea, vomiting, diarrhea, constipation, dizziness, abdominal pain, skin rash, fevers, chills, night sweats, weight loss, swollen lymph nodes, body aches, joint swelling, muscle aches, chest pain, shortness of breath, mood changes.    Objective  Blood pressure 110/72, pulse 64, height 5\' 5"  (1.651 m), weight 123 lb 12.8 oz (56.2 kg).    Systems  examined below as of 01/26/17 General: NAD A&O x3 mood, affect normal  HEENT: Pupils equal, extraocular movements intact no nystagmus Respiratory: not short of breath at rest or with speaking Cardiovascular: No lower extremity edema, non tender Skin: Warm dry intact with no signs of infection or rash on extremities or on axial skeleton. Abdomen: Soft nontender, no masses Neuro: Cranial nerves  intact, neurovascularly intact in all extremities with 2+ DTRs and 2+ pulses. Lymph: No lymphadenopathy appreciated today  Gait normal with good balance and coordination.  MSK: Non tender with full range  of motion and good stability and symmetric strength and tone of shoulders, elbows, wrist,  hips and ankles bilaterally.   Knee: Right Mild lateral tilt of the knee Palpation normal with no warmth, joint line tenderness, patellar tenderness, or condyle tenderness. ROM full in flexion and extension and lower leg rotation. Ligaments with solid consistent endpoints including ACL, PCL, LCL, MCL. Positive Mcmurray's, Apley's, and Thessalonian tests. Non painful patellar compression. Patellar glide without crepitus. Patellar and quadriceps tendons unremarkable. Hamstring and quadriceps strength is normal.     After informed written and verbal consent, patient was seated on exam table. Right knee was prepped with alcohol swab and utilizing anterolateral approach, patient's right knee space was injected with 4:1  marcaine 0.5%: Kenalog 40mg /dL. Patient tolerated the procedure well without immediate complications.    Impression and Recommendations:     This case required medical decision making of moderate complexity.      Note: This dictation was prepared with Dragon dictation along with smaller phrase technology. Any transcriptional errors that result from this process are unintentional.

## 2017-01-26 ENCOUNTER — Ambulatory Visit (INDEPENDENT_AMBULATORY_CARE_PROVIDER_SITE_OTHER): Payer: 59 | Admitting: Family Medicine

## 2017-01-26 ENCOUNTER — Encounter: Payer: Self-pay | Admitting: Family Medicine

## 2017-01-26 DIAGNOSIS — S83281D Other tear of lateral meniscus, current injury, right knee, subsequent encounter: Secondary | ICD-10-CM | POA: Diagnosis not present

## 2017-01-26 NOTE — Patient Instructions (Signed)
Good to see you  Tell Bethany [name] about her shoes ;) Ice 20 minutes 2 times daily. Usually after activity and before bed. Start a walk-run progression: 3 times a week running for up to 30 minutes.   - Run 2 mins, then walk 1 min. -Then run 3 mins, and walk 1 min. -Then run 4 mins, and walk 1 min. -Then run 5 mins, and walk 1 min. -Slowly build up weekly to running 30 mins nonstop.  If painful at any of the steps, back up one step. See me again in 4-6 weeks but you should do well.

## 2017-01-26 NOTE — Assessment & Plan Note (Signed)
Patient is an injection at today's any narrative worsening symptoms. This didn't seem to be acutely. Likely will do well with conservative therapy. We discussed icing regimen and home exercises. We discussed which activities to do in which ones to avoid. Patient is to start increasing activity including running very slowly. Patient will come back and see me again in 4-6 weeks. Worsening symptoms we'll consider either formal physical therapy or we'll consider possible MRI if any more mechanical symptoms occur.

## 2017-02-15 DIAGNOSIS — Z1231 Encounter for screening mammogram for malignant neoplasm of breast: Secondary | ICD-10-CM | POA: Diagnosis not present

## 2017-02-15 DIAGNOSIS — Z803 Family history of malignant neoplasm of breast: Secondary | ICD-10-CM | POA: Diagnosis not present

## 2017-02-16 DIAGNOSIS — H02401 Unspecified ptosis of right eyelid: Secondary | ICD-10-CM | POA: Diagnosis not present

## 2017-03-01 NOTE — Progress Notes (Signed)
[name] [place], [place] [address] Phone: [phone] Subjective:    I'm seeing this patient by the request  of:  Wyatt Haste, MD   CC: Right knee pain f/u   [id number]  Bethany [name] is a 61 y.o. female coming in with complaint of right knee pain. Patient was found to have a lateral meniscal tear as well as patellofemoral syndrome. Patient was doing decent with conservative therapy but then had an injection. States that she is feeling significantly better. Still some mild discomfort with running but nothing as severe. Patient has started to increase her running with no significant problems. Still pain on the lateral aspect the knee she does too much activity. Otherwise patient thinks that she has improved.    Past Medical History:  Diagnosis Date  . Anxiety   . Cervical dysplasia 1990   CIN 2 subsequent cryosurgery. All Paps normal afterwards  . Dyslipidemia   . Herpes genitalis   . Herpes labialis   . Hypercholesteremia   . IBS (irritable bowel syndrome)   . Osteopenia 04/2014   T score -1.7 FRAX 7.3%/0.8% stable from prior DEXA 2013  . Raynaud disease    Past Surgical History:  Procedure Laterality Date  . AUGMENTATION MAMMAPLASTY    . BREAST ENHANCEMENT SURGERY  2006  . COSMETIC SURGERY    . GYNECOLOGIC CRYOSURGERY  1990  . LIGAMENT REPAIR Right 09/24/2015   Procedure: RIGHT INDEX METACARPAL PHALANGEAL RADIAL COLLATERAL LIGAMENT REPAIR;  Surgeon: Leanora Cover, MD;  Location: [facility];  Service: Orthopedics;  Laterality: Right;  . OOPHORECTOMY     BSO  . TONSILLECTOMY AND ADENOIDECTOMY    . TUBAL LIGATION    . UPPER GASTROINTESTINAL ENDOSCOPY  04/24/05  . VAGINAL HYSTERECTOMY  2005   [facility] BSO  adenomyosis   Social History   Social History  . Marital status: Married    Spouse name: N/A  . Number of children: N/A  . Years of education: N/A   Social History Main Topics  . Smoking  status: Former Smoker    Quit date: 03/21/2006  . Smokeless tobacco: Never Used  . Alcohol use No     Comment: drinking in 1993  . Drug use: No  . Sexual activity: Yes    Birth control/ protection: Surgical     Comment: HYST-1st intercourse 61 yo-More than 5 partners   Other Topics Concern  . Not on file   Social History Narrative  . No narrative on file   Allergies  Allergen Reactions  . Morphine And Related Nausea Only   Family History  Problem Relation Age of Onset  . Hypertension Mother   . Cancer Father 31    Lymphoma  . Breast cancer Sister 56  . Colon cancer Neg Hx     Past medical history, social, surgical and family history all reviewed in electronic medical record.  No pertanent information unless stated regarding to the chief complaint.   Review of Systems: No headache, visual changes, nausea, vomiting, diarrhea, constipation, dizziness, abdominal pain, skin rash, fevers, chills, night sweats, weight loss, swollen lymph nodes, body aches, joint swelling, muscle aches, chest pain, shortness of breath, mood changes.     Objective  Blood pressure 118/68, pulse 62, resp. rate 16, weight 125 lb 6 oz (56.9 kg), SpO2 98 %.    Systems examined below as of 03/02/17 General: NAD A&O x3 mood, affect normal  HEENT: Pupils equal, extraocular movements intact  no nystagmus Respiratory: not short of breath at rest or with speaking Cardiovascular: No lower extremity edema, non tender Skin: Warm dry intact with no signs of infection or rash on extremities or on axial skeleton. Abdomen: Soft nontender, no masses Neuro: Cranial nerves  intact, neurovascularly intact in all extremities with 2+ DTRs and 2+ pulses. Lymph: No lymphadenopathy appreciated today  Gait normal with good balance and coordination.  MSK: Non tender with full range of motion and good stability and symmetric strength and tone of shoulders, elbows, wrist,  hips and ankles bilaterally.   Knee: Right Lateral  tilt still noted on the right Mild discomfort of the left side of the knee on the lateral joint line. ROM full in flexion and extension and lower leg rotation. Ligaments with solid consistent endpoints including ACL, PCL, LCL, MCL. Negative Mcmurray's, Apley's, and Thessalonian tests. Mild painful patellar compression. Patellar glide with mild to moderate crepitus. Patellar and quadriceps tendons unremarkable. Hamstring and quadriceps strength is normal.  Contralateral knee unremarkable  MSK US performed of: Right knee This study was ordered, performed, and interpreted by Charlann Boxer D.O.  Knee: Lateral meniscal tear that was previously seen. Significant decrease in hypoechoic changes. Nondisplaced. Patient though does have what appears to be a calcific cyst on the articular side of the fibular head and the insertion of the LCL. No increase in Doppler flow. Noncompressible. Patient does have some narrowing of the patellofemoral joint.  IMPRESSION: Patient has significant decrease in hypoechoic changes and chronic tear of the lateral meniscus still noted. Small calcific mass noted on the lateral aspect of the knee    Impression and Recommendations:     This case required medical decision making of moderate complexity.      Note: This dictation was prepared with Dragon dictation along with smaller phrase technology. Any transcriptional errors that result from this process are unintentional.

## 2017-03-02 ENCOUNTER — Encounter: Payer: Self-pay | Admitting: Family Medicine

## 2017-03-02 ENCOUNTER — Ambulatory Visit (INDEPENDENT_AMBULATORY_CARE_PROVIDER_SITE_OTHER): Payer: 59 | Admitting: Family Medicine

## 2017-03-02 ENCOUNTER — Ambulatory Visit: Payer: Self-pay

## 2017-03-02 VITALS — BP 118/68 | HR 62 | Resp 16 | Wt 125.4 lb

## 2017-03-02 DIAGNOSIS — S83281D Other tear of lateral meniscus, current injury, right knee, subsequent encounter: Secondary | ICD-10-CM | POA: Diagnosis not present

## 2017-03-02 DIAGNOSIS — M25561 Pain in right knee: Secondary | ICD-10-CM | POA: Diagnosis not present

## 2017-03-02 MED ORDER — VITAMIN D (ERGOCALCIFEROL) 1.25 MG (50000 UNIT) PO CAPS: 50000.0000 [IU] | ORAL_CAPSULE | ORAL | 0 refills | Status: DC

## 2017-03-02 NOTE — Assessment & Plan Note (Signed)
Seems better after the injection. No significant displacement. Patient does have an area that does have a calcific area that we will monitor. Started on once weekly vitamin D to help with any reabsorption neck be possible. We discussed icing regimen. Follow-up again in 3 weeks

## 2017-03-02 NOTE — Patient Instructions (Signed)
Good to see you  Bethany [name] is your friend.  Stay active.  Once weekly vtamin D for 12 weeks.  I am hoping at 4 weeks you should be great! See me again in 4ish weeks and lets make sure the calcium is gone.

## 2017-03-09 DIAGNOSIS — L812 Freckles: Secondary | ICD-10-CM | POA: Diagnosis not present

## 2017-03-09 DIAGNOSIS — C44619 Basal cell carcinoma of skin of left upper limb, including shoulder: Secondary | ICD-10-CM | POA: Diagnosis not present

## 2017-03-09 DIAGNOSIS — L570 Actinic keratosis: Secondary | ICD-10-CM | POA: Diagnosis not present

## 2017-03-09 DIAGNOSIS — Z85828 Personal history of other malignant neoplasm of skin: Secondary | ICD-10-CM | POA: Diagnosis not present

## 2017-03-16 ENCOUNTER — Encounter: Payer: Self-pay | Admitting: Family Medicine

## 2017-03-26 ENCOUNTER — Other Ambulatory Visit: Payer: Self-pay | Admitting: Medical

## 2017-03-26 ENCOUNTER — Encounter: Payer: Self-pay | Admitting: Medical

## 2017-03-26 ENCOUNTER — Ambulatory Visit (INDEPENDENT_AMBULATORY_CARE_PROVIDER_SITE_OTHER): Payer: 59 | Admitting: Medical

## 2017-03-26 VITALS — Temp 97.5°F | Wt 124.6 lb

## 2017-03-26 DIAGNOSIS — R21 Rash and other nonspecific skin eruption: Secondary | ICD-10-CM

## 2017-03-26 DIAGNOSIS — R42 Dizziness and giddiness: Secondary | ICD-10-CM

## 2017-03-26 DIAGNOSIS — G4489 Other headache syndrome: Secondary | ICD-10-CM

## 2017-03-26 DIAGNOSIS — T887XXA Unspecified adverse effect of drug or medicament, initial encounter: Secondary | ICD-10-CM

## 2017-03-26 DIAGNOSIS — T50905A Adverse effect of unspecified drugs, medicaments and biological substances, initial encounter: Secondary | ICD-10-CM

## 2017-03-26 LAB — CBC WITH DIFFERENTIAL/PLATELET
BASOS PCT: 1 %
Basophils Absolute: 62 cells/uL (ref 0–200)
EOS PCT: 1 %
Eosinophils Absolute: 62 cells/uL (ref 15–500)
HCT: 39.9 % (ref 35.0–45.0)
Hemoglobin: 13.1 g/dL (ref 11.7–15.5)
Lymphocytes Relative: 28 %
Lymphs Abs: 1736 cells/uL (ref 850–3900)
MCH: 32.8 pg (ref 27.0–33.0)
MCHC: 32.8 g/dL (ref 32.0–36.0)
MCV: 100 fL (ref 80.0–100.0)
MONOS PCT: 7 %
MPV: 10.8 fL (ref 7.5–12.5)
Monocytes Absolute: 434 cells/uL (ref 200–950)
NEUTROS ABS: 3906 {cells}/uL (ref 1500–7800)
Neutrophils Relative %: 63 %
PLATELETS: 245 10*3/uL (ref 140–400)
RBC: 3.99 MIL/uL (ref 3.80–5.10)
RDW: 13.5 % (ref 11.0–15.0)
WBC: 6.2 10*3/uL (ref 4.0–10.5)

## 2017-03-26 MED ORDER — MECLIZINE HCL 25 MG PO TABS: 25.0000 mg | ORAL_TABLET | Freq: Two times a day (BID) | ORAL | 0 refills | Status: DC

## 2017-03-26 NOTE — Progress Notes (Signed)
Subjective: Chief Complaint  Patient presents with  . Rash    rash, dizziness  started last night .    Here for dizziness, headache, rash.   Has been in usual state of health prior to last night.   Is active, exercises regularly with a variety of exercise.  However, last night she became very dizzy when she got up to use the bathroom.  Went back to bed.  This morning though was dizzy again.  Started getting bad headache around 6/10 this morning, headache is dull in the middle of her head.  Went to exercise this morning, doing abs felt dizzy again like ceiling was spinning.  She also noticed rash on both thighs laterally this morning.  No recent exposure to this area.  No recent tick bites.  She notes note that her psychiatrist recently increased her Lamictal 2 weeks ago.  Was on 50mg  morning, 75mg  night, but this was increased to 75mg  BID.   She doesn't drink alcohol, no tobacco.   No recent head injury.  She did fall on her buttocks last weekend hiking but no head injury. No prior similar dizziness.  No hx/o vertigo.  No other aggravating or relieving factors. No other complaint.  Past Medical History:  Diagnosis Date  . Anxiety   . Cervical dysplasia 1990   CIN 2 subsequent cryosurgery. All Paps normal afterwards  . Dyslipidemia   . Herpes genitalis   . Herpes labialis   . Hypercholesteremia   . IBS (irritable bowel syndrome)   . Osteopenia 04/2014   T score -1.7 FRAX 7.3%/0.8% stable from prior DEXA 2013  . Raynaud disease    Current Outpatient Prescriptions on File Prior to Visit  Medication Sig Dispense Refill  . b complex vitamins tablet Take 1 tablet by mouth daily.    . cholecalciferol (VITAMIN D) 1000 UNITS tablet Take 1,000 Units by mouth daily.    Marland Kitchen gabapentin (NEURONTIN) 600 MG tablet Take 600 mg by mouth. 1 IN THE AM AND 2 IN THE PM.     . ibuprofen (ADVIL,MOTRIN) 800 MG tablet Take 1 tablet (800 mg total) by mouth every 8 (eight) hours as needed. 30 tablet 3  . lamoTRIgine  (LAMICTAL) 25 MG tablet Take 2 tablets by mouth 2 (two) times daily.   0  . MINIVELLE 0.1 MG/24HR patch apply 1 patch two times a week as directed 8 patch 12  . Naproxen (NAPROSYN PO) Take by mouth every 12 (twelve) hours. 1 tablet every 12 hrs    . PROCTOZONE-HC 2.5 % rectal cream apply rectally twice a day 30 g 1  . simvastatin (ZOCOR) 20 MG tablet Take 20 mg by mouth daily.    . Vitamin D, Ergocalciferol, (DRISDOL) 50000 units CAPS capsule Take 1 capsule (50,000 Units total) by mouth every 7 (seven) days. 12 capsule 0   No current facility-administered medications on file prior to visit.    ROS as in subjective    Objective Temp 97.5 F (36.4 C)   Wt 124 lb 9.6 oz (56.5 kg)   BMI 20.73 kg/m     Orthostatic vitals reviewed, normal  General appearance: alert, no distress, WD/WN, lean white female HEENT: normocephalic, sclerae anicteric, PERRLA, EOMi, nares patent, no discharge or erythema, pharynx normal Oral cavity: MMM, no lesions Neck: supple, no lymphadenopathy, no thyromegaly, no masses Heart: RRR, normal S1, S2, no murmurs Lungs: CTA bilaterally, no wheezes, rhonchi, or rales Extremities: no edema, no cyanosis, no clubbing Pulses: 2+ symmetric, upper and  lower extremities, normal cap refill Neurological: alert, oriented x 3, CN2-12 intact, strength normal upper extremities and lower extremities, sensation normal throughout, DTRs 2+ throughout, no cerebellar signs, gait normal Psychiatric: normal affect, behavior normal, pleasant  bilat upper thighs with flat pinkish rash approx 10cm long area by 3cm wide, but nonspecific   Assessment: Encounter Diagnoses  Name Primary?  . Dizziness Yes  . Headache syndrome   . Rash   . Adverse effect of drug, initial encounter     Plan: discussed case with supervising physician Dr. Redmond School as well.  We feel that the increase in Lamictal could be the culprit.  I did discus with her that I can't rule out other causes, but no obvious  sign on exam or symptoms to warrant head imaging at this time.   We will check some labs.  Begin meclizine BID for 4-5 days, hydrate well, avoid sudden motions that would aggravate the dizziness.   Advised she go back to her previous dose of Lamictal.  Hopefully symptoms will gradually resolve in the next few days, but if worsening rash, peeling skin, numbness, tingling, weakness or other worse or new symptoms then get rechecked or call 911 if serious symptoms as we discussed.    Lovada was seen today for rash.  Diagnoses and all orders for this visit:  Dizziness -     CBC with Differential/Platelet -     Basic metabolic panel -     Sedimentation rate  Headache syndrome -     CBC with Differential/Platelet -     Basic metabolic panel -     Sedimentation rate  Rash -     CBC with Differential/Platelet -     Basic metabolic panel -     Sedimentation rate  Adverse effect of drug, initial encounter -     CBC with Differential/Platelet -     Basic metabolic panel -     Sedimentation rate  Other orders -     meclizine (ANTIVERT) 25 MG tablet; Take 1 tablet (25 mg total) by mouth 2 (two) times daily.

## 2017-03-26 NOTE — Patient Instructions (Signed)
Recommendations:  Go back to your prior Lamictal dose   begin Meclizine oral tablet twice daily for the next several days  Hydrate well with water  Avoid sudden motions such as getting up too quickly  Don't drive if too dizzy  symptoms should gradually resolve  We will call with lab results

## 2017-03-27 LAB — BASIC METABOLIC PANEL
BUN: 17 mg/dL (ref 7–25)
CHLORIDE: 102 mmol/L (ref 98–110)
CO2: 26 mmol/L (ref 20–31)
Calcium: 9.3 mg/dL (ref 8.6–10.4)
Creat: 1.05 mg/dL — ABNORMAL HIGH (ref 0.50–0.99)
Glucose, Bld: 82 mg/dL (ref 65–99)
POTASSIUM: 4.4 mmol/L (ref 3.5–5.3)
SODIUM: 139 mmol/L (ref 135–146)

## 2017-03-27 LAB — SEDIMENTATION RATE: Sed Rate: 7 mm/hr (ref 0–30)

## 2017-03-30 ENCOUNTER — Telehealth: Payer: Self-pay | Admitting: Family Medicine

## 2017-03-30 NOTE — Telephone Encounter (Signed)
Let her know that her calcium level is normal which speaks against any parathyroid issue. Can't legitimately order a thyroid based on the fact that her sister has Graves' disease. If she has symptoms of thyroid, have her come in and we can discuss this more detail.

## 2017-03-30 NOTE — Progress Notes (Signed)
[name] [place] [name], [name] [address] Phone: [phone] Subjective:    I'm seeing this patient by the request  of:  Denita Lung, MD   CC: Right knee pain f/u   [id number]  Bethany [name] Bethany [name] is a 61 y.o. female coming in with complaint of right knee pain. Patient was found to have a lateral meniscal tear as well as patellofemoral syndrome. Patient was doing decent with conservative therapy but then had an injection. States at this point she is feeling 99% better. Having very minimal discomfort overall. Is able to do all exercises. Patient has been running and working on a regular basis.    Past Medical History:  Diagnosis Date  . Anxiety   . Cervical dysplasia 1990   CIN 2 subsequent cryosurgery. All Paps normal afterwards  . Dyslipidemia   . Herpes genitalis   . Herpes labialis   . Hypercholesteremia   . IBS (irritable bowel syndrome)   . Osteopenia 04/2014   T score -1.7 FRAX 7.3%/0.8% stable from prior DEXA 2013  . Raynaud disease    Past Surgical History:  Procedure Laterality Date  . AUGMENTATION MAMMAPLASTY    . BREAST ENHANCEMENT SURGERY  2006  . COSMETIC SURGERY    . GYNECOLOGIC CRYOSURGERY  1990  . LIGAMENT REPAIR Right 09/24/2015   Procedure: RIGHT INDEX METACARPAL PHALANGEAL RADIAL COLLATERAL LIGAMENT REPAIR;  Surgeon: Leanora Cover, MD;  Location: [facility];  Service: Orthopedics;  Laterality: Right;  . OOPHORECTOMY     BSO  . TONSILLECTOMY AND ADENOIDECTOMY    . TUBAL LIGATION    . UPPER GASTROINTESTINAL ENDOSCOPY  04/24/05  . VAGINAL HYSTERECTOMY  2005   [facility] BSO  adenomyosis   Social History   Social History  . Marital status: Married    Spouse name: N/A  . Number of children: N/A  . Years of education: N/A   Social History Main Topics  . Smoking status: Former Smoker    Quit date: 03/21/2006  . Smokeless tobacco: Never Used  . Alcohol use No     Comment: drinking in 1993  . Drug  use: No  . Sexual activity: Yes    Birth control/ protection: Surgical     Comment: HYST-1st intercourse 61 yo-More than 5 partners   Other Topics Concern  . Not on file   Social History Narrative  . No narrative on file   Allergies  Allergen Reactions  . Morphine And Related Nausea Only   Family History  Problem Relation Age of Onset  . Hypertension Mother   . Cancer Father 82       Lymphoma  . Breast cancer Sister 76  . Colon cancer Neg Hx     Past medical history, social, surgical and family history all reviewed in electronic medical record.  No pertanent information unless stated regarding to the chief complaint.   Review of Systems: No headache, visual changes, nausea, vomiting, diarrhea, constipation, dizziness, abdominal pain, skin rash, fevers, chills, night sweats, weight loss, swollen lymph nodes, body aches, joint swelling, muscle aches, chest pain, shortness of breath, mood changes.   Objective  There were no vitals taken for this visit.    Systems examined below as of 03/31/17 General: NAD A&O x3 mood, affect normal  HEENT: Pupils equal, extraocular movements intact no nystagmus Respiratory: not short of breath at rest or with speaking Cardiovascular: No lower extremity edema, non tender Skin: Warm dry intact with no signs  of infection or rash on extremities or on axial skeleton. Abdomen: Soft nontender, no masses Neuro: Cranial nerves  intact, neurovascularly intact in all extremities with 2+ DTRs and 2+ pulses. Lymph: No lymphadenopathy appreciated today  Gait normal with good balance and coordination.  MSK: Non tender with full range of motion and good stability and symmetric strength and tone of shoulders, elbows, wrist,  hips and ankles bilaterally.    Knee: Right Normal to inspection with no erythema or effusion or obvious bony abnormalities. Palpation normal with no warmth, joint line tenderness, patellar tenderness, or condyle tenderness. ROM full in  flexion and extension and lower leg rotation. Ligaments with solid consistent endpoints including ACL, PCL, LCL, MCL. Negative Mcmurray's, Apley's, and Thessalonian tests. Non painful patellar compression. Patellar glide without crepitus. Patellar and quadriceps tendons unremarkable. Hamstring and quadriceps strength is normal.  Contralateral knee unremarkable     Impression and Recommendations:     This case required medical decision making of moderate complexity.      Note: This dictation was prepared with Dragon dictation along with smaller phrase technology. Any transcriptional errors that result from this process are unintentional.

## 2017-03-30 NOTE — Telephone Encounter (Signed)
Pt returned your call, states doing better, wanted to know if you tested her thyroid & if not can you add thyroid to blood you already have? mom has parathyroid, and sister has Graves disease.  Please call and let her know

## 2017-03-31 ENCOUNTER — Ambulatory Visit (INDEPENDENT_AMBULATORY_CARE_PROVIDER_SITE_OTHER): Payer: 59 | Admitting: Family Medicine

## 2017-03-31 ENCOUNTER — Encounter: Payer: Self-pay | Admitting: Family Medicine

## 2017-03-31 DIAGNOSIS — S83281D Other tear of lateral meniscus, current injury, right knee, subsequent encounter: Secondary | ICD-10-CM | POA: Diagnosis not present

## 2017-03-31 LAB — TSH: TSH: 2.9 m[IU]/L

## 2017-03-31 NOTE — Telephone Encounter (Signed)
Left word for word message on pt cell VM

## 2017-03-31 NOTE — Assessment & Plan Note (Signed)
Much better at this time. Discussed with patient at great length. Patient will start icing regimen, home exercises, which activities to do a which ones to avoid. Patient will start to change her vitamin D into daily after this last several weeks. Patient knows if worsening symptoms come back for further evaluation.

## 2017-04-11 ENCOUNTER — Other Ambulatory Visit: Payer: Self-pay | Admitting: Family Medicine

## 2017-04-11 DIAGNOSIS — E785 Hyperlipidemia, unspecified: Secondary | ICD-10-CM

## 2017-04-12 NOTE — Telephone Encounter (Signed)
Dr.Lalonde this RX looks like it had been changed is it okay to refill

## 2017-04-12 NOTE — Telephone Encounter (Signed)
Call and have her come in for a nurse visit lipid panel. I will put it into the system.

## 2017-04-21 ENCOUNTER — Encounter: Payer: Self-pay | Admitting: Family Medicine

## 2017-05-11 DIAGNOSIS — Z85828 Personal history of other malignant neoplasm of skin: Secondary | ICD-10-CM | POA: Diagnosis not present

## 2017-05-11 DIAGNOSIS — L821 Other seborrheic keratosis: Secondary | ICD-10-CM | POA: Diagnosis not present

## 2017-06-06 ENCOUNTER — Other Ambulatory Visit: Payer: Self-pay | Admitting: Family Medicine

## 2017-06-13 NOTE — Progress Notes (Signed)
[name] [place], [place] [address] Phone: [phone] Subjective:    I'm seeing this patient by the request  of:  Denita Lung, MD   CC: Right knee pain f/u   [id number]  Bethany [name] is a 61 y.o. female coming in with complaint of right knee pain. Patient was found to have a lateral meniscal tear as well as patellofemoral syndrome. Worsening pain recently, a little locking. No swelling, still running.  No new injury     Past Medical History:  Diagnosis Date  . Anxiety   . Cervical dysplasia 1990   CIN 2 subsequent cryosurgery. All Paps normal afterwards  . Dyslipidemia   . Herpes genitalis   . Herpes labialis   . Hypercholesteremia   . IBS (irritable bowel syndrome)   . Osteopenia 04/2014   T score -1.7 FRAX 7.3%/0.8% stable from prior DEXA 2013  . Raynaud disease    Past Surgical History:  Procedure Laterality Date  . AUGMENTATION MAMMAPLASTY    . BREAST ENHANCEMENT SURGERY  2006  . COSMETIC SURGERY    . GYNECOLOGIC CRYOSURGERY  1990  . LIGAMENT REPAIR Right 09/24/2015   Procedure: RIGHT INDEX METACARPAL PHALANGEAL RADIAL COLLATERAL LIGAMENT REPAIR;  Surgeon: Leanora Cover, MD;  Location: [facility];  Service: Orthopedics;  Laterality: Right;  . OOPHORECTOMY     BSO  . TONSILLECTOMY AND ADENOIDECTOMY    . TUBAL LIGATION    . UPPER GASTROINTESTINAL ENDOSCOPY  04/24/05  . VAGINAL HYSTERECTOMY  2005   [facility] BSO  adenomyosis   Social History   Social History  . Marital status: Married    Spouse name: N/A  . Number of children: N/A  . Years of education: N/A   Social History Main Topics  . Smoking status: Former Smoker    Quit date: 03/21/2006  . Smokeless tobacco: Never Used  . Alcohol use No     Comment: drinking in 1993  . Drug use: No  . Sexual activity: Yes    Birth control/ protection: Surgical     Comment: HYST-1st intercourse 61 yo-More than 5 partners   Other Topics Concern  .  None   Social History Narrative  . None   Allergies  Allergen Reactions  . Morphine And Related Nausea Only   Family History  Problem Relation Age of Onset  . Hypertension Mother   . Cancer Father 43       Lymphoma  . Breast cancer Sister 70  . Colon cancer Neg Hx     Past medical history, social, surgical and family history all reviewed in electronic medical record.  No pertanent information unless stated regarding to the chief complaint.   Review of Systems: No headache, visual changes, nausea, vomiting, diarrhea, constipation, dizziness, abdominal pain, skin rash, fevers, chills, night sweats, weight loss, swollen lymph nodes, body aches, joint swelling, chest pain, shortness of breath, mood changes. + muscle aches.   Objective  Blood pressure 110/76, pulse 63, height 5\' 5"  (1.651 m), weight 123 lb (55.8 kg), SpO2 98 %.    Systems examined below as of 06/14/17 General: NAD A&O x3 mood, affect normal  HEENT: Pupils equal, extraocular movements intact no nystagmus Respiratory: not short of breath at rest or with speaking Cardiovascular: No lower extremity edema, non tender Skin: Warm dry intact with no signs of infection or rash on extremities or on axial skeleton. Abdomen: Soft nontender, no masses Neuro: Cranial nerves  intact, neurovascularly  intact in all extremities with 2+ DTRs and 2+ pulses. Lymph: No lymphadenopathy appreciated today  Gait normal with good balance and coordination.  MSK: Non tender with full range of motion and good stability and symmetric strength and tone of shoulders, elbows, wrist,  hips and ankles bilaterally.   Knee: Right Normal to inspection with no erythema or effusion or obvious bony abnormalities. TTP on medial and pf joint  ROM full in flexion and extension and lower leg rotation. Ligaments with solid consistent endpoints including ACL, PCL, LCL, MCL. Positive Mcmurray's, Apley's, and Thessalonian tests. Mild painful patellar  compression. Patellar glide with moderatecrepitus. Patellar and quadriceps tendons unremarkable. Hamstring and quadriceps strength is normal.  Contralateral knee unremarkable.   After informed written and verbal consent, patient was seated on exam table. Right knee was prepped with alcohol swab and utilizing anterolateral approach, patient's right knee space was injected with 4:1  marcaine 0.5%: Kenalog 40mg /dL. Patient tolerated the procedure well without immediate complications.    Impression and Recommendations:     This case required medical decision making of moderate complexity.      Note: This dictation was prepared with Dragon dictation along with smaller phrase technology. Any transcriptional errors that result from this process are unintentional.

## 2017-06-14 ENCOUNTER — Ambulatory Visit (INDEPENDENT_AMBULATORY_CARE_PROVIDER_SITE_OTHER): Payer: 59 | Admitting: Family Medicine

## 2017-06-14 ENCOUNTER — Encounter: Payer: Self-pay | Admitting: Family Medicine

## 2017-06-14 DIAGNOSIS — S83281D Other tear of lateral meniscus, current injury, right knee, subsequent encounter: Secondary | ICD-10-CM

## 2017-06-14 MED ORDER — AZITHROMYCIN 500 MG PO TABS: 500.0000 mg | ORAL_TABLET | Freq: Every day | ORAL | 0 refills | Status: DC

## 2017-06-14 MED ORDER — PREDNISONE 50 MG PO TABS: 50.0000 mg | ORAL_TABLET | Freq: Every day | ORAL | 0 refills | Status: DC

## 2017-06-14 NOTE — Patient Instructions (Signed)
Youknow the [employer] will do great  Ice is your friend if you can find it there See me again when you get back!

## 2017-06-14 NOTE — Assessment & Plan Note (Signed)
Patient given a repeat injection today. Tolerated the procedure well. [place] [name] this will be beneficial. Prednisone given an case patient has any pain well [place]. We'll follow-up with me within one week after going.

## 2017-06-15 ENCOUNTER — Encounter: Payer: Self-pay | Admitting: *Deleted

## 2017-06-18 ENCOUNTER — Ambulatory Visit (INDEPENDENT_AMBULATORY_CARE_PROVIDER_SITE_OTHER): Payer: 59 | Admitting: Medical

## 2017-06-18 ENCOUNTER — Encounter: Payer: Self-pay | Admitting: Medical

## 2017-06-18 VITALS — BP 116/72 | HR 82 | Wt 125.6 lb

## 2017-06-18 DIAGNOSIS — H00012 Hordeolum externum right lower eyelid: Secondary | ICD-10-CM

## 2017-06-18 MED ORDER — ERYTHROMYCIN 5 MG/GM OP OINT: 1.0000 "application " | TOPICAL_OINTMENT | Freq: Four times a day (QID) | OPHTHALMIC | 0 refills | Status: DC

## 2017-06-18 MED ORDER — CEPHALEXIN 500 MG PO CAPS
500.0000 mg | ORAL_CAPSULE | Freq: Three times a day (TID) | ORAL | 0 refills | Status: DC
Start: 2017-06-18 — End: 2017-08-04

## 2017-06-18 NOTE — Progress Notes (Signed)
Subjective: Chief Complaint  Patient presents with  . stye on right eye    stye on right eye , sore and swollen x 1 day    Here for stye on right eye.  Started yesterday, but more swollen and sore today.  Had some pus drainage today.  No fever.   Has had this prior a few times, maybe 3 years ago.  She leaves for an extended trip hiking in [place], [place] next Saturday.  Really needs this gone for this trip she has been planning for months.  She brought in literature about keflex oral as treatment.  No other aggravating or relieving factors. No other complaint.   Past Medical History:  Diagnosis Date  . Anxiety   . Cervical dysplasia 1990   CIN 2 subsequent cryosurgery. All Paps normal afterwards  . Dyslipidemia   . Herpes genitalis   . Herpes labialis   . Hypercholesteremia   . IBS (irritable bowel syndrome)   . Osteopenia 04/2014   T score -1.7 FRAX 7.3%/0.8% stable from prior DEXA 2013  . Raynaud disease    Current Outpatient Prescriptions on File Prior to Visit  Medication Sig Dispense Refill  . b complex vitamins tablet Take 1 tablet by mouth daily.    . cholecalciferol (VITAMIN D) 1000 UNITS tablet Take 1,000 Units by mouth daily.    Marland Kitchen gabapentin (NEURONTIN) 600 MG tablet Take 600 mg by mouth. 1 IN THE AM AND 2 IN THE PM.     . ibuprofen (ADVIL,MOTRIN) 800 MG tablet Take 1 tablet (800 mg total) by mouth every 8 (eight) hours as needed. 30 tablet 3  . lamoTRIgine (LAMICTAL) 25 MG tablet Take 2 tablets by mouth 2 (two) times daily.   0  . MINIVELLE 0.1 MG/24HR patch apply 1 patch two times a week as directed 8 patch 12  . Naproxen (NAPROSYN PO) Take by mouth every 12 (twelve) hours. 1 tablet every 12 hrs    . predniSONE (DELTASONE) 50 MG tablet Take 1 tablet (50 mg total) by mouth daily. 5 tablet 0  . PROCTOZONE-HC 2.5 % rectal cream apply rectally twice a day 30 g 1  . simvastatin (ZOCOR) 20 MG tablet Take 20 mg by mouth daily.    . Vitamin D, Ergocalciferol, (DRISDOL) 50000  units CAPS capsule take 1 capsule by mouth every 7 days 12 capsule 0  . meclizine (ANTIVERT) 25 MG tablet Take 1 tablet (25 mg total) by mouth 2 (two) times daily. (Patient not taking: Reported on 06/18/2017) 30 tablet 0   No current facility-administered medications on file prior to visit.    ROS as in subjective   Objective: BP 116/72   Pulse 82   Wt 125 lb 9.6 oz (57 kg)   SpO2 98%   BMI 20.90 kg/m   Gen: wd, wn, nad Right lower eyelid midline with erythematous raised gland that appears inflamed suggestive of stye.   otherwise eyes without obvious deformity, PERRLA, EOMi.      Assessment: Encounter Diagnosis  Name Primary?  . Hordeolum externum of right lower eyelid Yes      Plan: Discussed findings, treatment recommendations.   Advised that the below treatment she is requesting is not typically first line, but given her time frame for her trip, she wants to be aggressive at resolving this.  Advised warm compresses thought the day, medications below, and she can make tentative apt with eye doctor for mid next week.    Fletcher was seen today for  stye on right eye.  Diagnoses and all orders for this visit:  Hordeolum externum of right lower eyelid  Other orders -     cephALEXin (KEFLEX) 500 MG capsule; Take 1 capsule (500 mg total) by mouth 3 (three) times daily. -     erythromycin [facility]) ophthalmic ointment; Place 1 application into both eyes 4 (four) times daily.

## 2017-06-21 DIAGNOSIS — H00012 Hordeolum externum right lower eyelid: Secondary | ICD-10-CM | POA: Diagnosis not present

## 2017-06-26 ENCOUNTER — Other Ambulatory Visit: Payer: Self-pay | Admitting: Family Medicine

## 2017-06-26 DIAGNOSIS — K649 Unspecified hemorrhoids: Secondary | ICD-10-CM

## 2017-08-04 ENCOUNTER — Ambulatory Visit (INDEPENDENT_AMBULATORY_CARE_PROVIDER_SITE_OTHER): Payer: 59 | Admitting: Family Medicine

## 2017-08-04 ENCOUNTER — Encounter: Payer: Self-pay | Admitting: Family Medicine

## 2017-08-04 VITALS — BP 120/72 | HR 68 | Ht 65.5 in | Wt 124.0 lb

## 2017-08-04 DIAGNOSIS — I7300 Raynaud's syndrome without gangrene: Secondary | ICD-10-CM

## 2017-08-04 DIAGNOSIS — Z0000 Encounter for general adult medical examination without abnormal findings: Secondary | ICD-10-CM | POA: Diagnosis not present

## 2017-08-04 DIAGNOSIS — D126 Benign neoplasm of colon, unspecified: Secondary | ICD-10-CM

## 2017-08-04 DIAGNOSIS — M25561 Pain in right knee: Secondary | ICD-10-CM

## 2017-08-04 DIAGNOSIS — F39 Unspecified mood [affective] disorder: Secondary | ICD-10-CM | POA: Diagnosis not present

## 2017-08-04 DIAGNOSIS — Z23 Encounter for immunization: Secondary | ICD-10-CM | POA: Diagnosis not present

## 2017-08-04 DIAGNOSIS — B001 Herpesviral vesicular dermatitis: Secondary | ICD-10-CM | POA: Diagnosis not present

## 2017-08-04 DIAGNOSIS — E785 Hyperlipidemia, unspecified: Secondary | ICD-10-CM | POA: Diagnosis not present

## 2017-08-04 LAB — LIPID PANEL
Cholesterol: 210 mg/dL — ABNORMAL HIGH (ref <200)
HDL: 98 mg/dL (ref >50)
LDL Cholesterol (Calc): 96 mg/dL (calc)
NON-HDL CHOLESTEROL (CALC): 112 mg/dL (ref <130)
Total CHOL/HDL Ratio: 2.1 (calc) (ref <5.0)
Triglycerides: 72 mg/dL (ref <150)

## 2017-08-04 LAB — POCT URINALYSIS DIP (PROADVANTAGE DEVICE)
Bilirubin, UA: NEGATIVE
Glucose, UA: NEGATIVE mg/dL
Ketones, POC UA: NEGATIVE mg/dL
Leukocytes, UA: NEGATIVE
NITRITE UA: NEGATIVE
Protein Ur, POC: NEGATIVE mg/dL
RBC UA: NEGATIVE
SPECIFIC GRAVITY, URINE: 1.03
Urobilinogen, Ur: NEGATIVE
pH, UA: 6 (ref 5.0–8.0)

## 2017-08-04 MED ORDER — SIMVASTATIN 20 MG PO TABS: 20.0000 mg | ORAL_TABLET | Freq: Every day | ORAL | 3 refills | Status: DC

## 2017-08-04 NOTE — Progress Notes (Signed)
Subjective:    Patient ID: Bethany [name], female    DOB: Mar 31, 1956, 61 y.o.   MRN: [id number]  HPI She is here for complete examination. She continues to have right knee pain and has had 2 injections, the most recent one was one month ago. She started running again is having more difficulty with the pain. She does have an appointment to see Dr. Tamala Julian soon. She continues to see Dr. Edgar Frisk who has prescribed her gabapentin and Lamictal for her underlying mood disorder. She remains alcohol free. She continues on simvastatin and is having no difficulty with that. She does have a history of Raynaud's disease but has had no difficulty with that. She has had a colonoscopy which did show tubular adenoma and is scheduled on a 5 year cycle. She continues on Zocor and is having no aches or pains. Has a history of current herpes labialis but none recently. Her work and home life are going well. The rest the family, social history, health maintenance and immunizations was reviewed.   Review of Systems  All other systems reviewed and are negative.      Objective:   Physical Exam BP 120/72 (BP Location: Left Arm, Patient Position: Sitting, Cuff Size: Normal)   Pulse 68   Ht 5' 5.5" (1.664 m)   Wt 124 lb (56.2 kg)   BMI 20.32 kg/m   General Appearance:    Alert, cooperative, no distress, appears stated age  Head:    Normocephalic, without obvious abnormality, atraumatic  Eyes:    PERRL, conjunctiva/corneas clear, EOM's intact, fundi    benign  Ears:    Normal TM's and external ear canals  Nose:   Nares normal, mucosa normal, no drainage or sinus   tenderness  Throat:   Lips, mucosa, and tongue normal; teeth and gums normal  Neck:   Supple, no lymphadenopathy;  thyroid:  no   enlargement/tenderness/nodules; no carotid   bruit or JVD     Lungs:     Clear to auscultation bilaterally without wheezes, rales or     ronchi; respirations unlabored      Heart:    Regular rate and rhythm, S1 and S2  normal, no murmur, rub   or gallop  Breast Exam:    Deferred to GYN  Abdomen:     Soft, non-tender, nondistended, normoactive bowel sounds,    no masses, no hepatosplenomegaly  Genitalia:    Deferred to GYN     Extremities:   No clubbing, cyanosis or edema  Pulses:   2+ and symmetric all extremities  Skin:   Skin color, texture, turgor normal, no rashes or lesions  Lymph nodes:   Cervical, supraclavicular, and axillary nodes normal  Neurologic:   CNII-XII intact, normal strength, sensation and gait; reflexes 2+ and symmetric throughout          Psych:   Normal mood, affect, hygiene and grooming.          Assessment & Plan:  Annual physical exam - Plan: POCT Urinalysis DIP (Proadvantage Device)  Need for influenza vaccination - Plan: Flu Vaccine QUAD 6+ mos PF IM (Fluarix Quad PF)  Need for shingles vaccine - Plan: Varicella-zoster vaccine IM (Shingrix)  Tubular adenoma of colon  Raynaud's disease without gangrene  Recurrent herpes labialis  Hyperlipidemia with target LDL less than 100 - Plan: Lipid panel, simvastatin (ZOCOR) 20 MG tablet  Mood disorder (HCC)  Right knee pain, unspecified chronicity She will continue on her present medications. She will  follow-up with Dr. Tamala Julian in the near future. Plans to set up her own mammogram. Return here in 2-6 months for repeat Shingrix vaccine.

## 2017-08-04 NOTE — Progress Notes (Signed)
[name] [address] [place], [place] [address] Phone: [phone] Subjective:     CC: Knee pain follow-up  [id number]  Bethany [name] is a 61 y.o. female coming in with complaint of knee pain. Patient has been seen previously for the right knee pain. Has been found to have more of a lateral meniscal tear. States that she is now having locking. Giving out on her. Had injection 2 months ago that seemed to help but now seems to be worsening again.  Patient is also having lower back pain. Patient states has had facet injections previously but no longer getting any benefit. Patient states that the pain is starting to affect daily activities and working out. Patient rates the severity of pain a 7 out of 10. Patient denies any significant radiation of pain,     Past Medical History:  Diagnosis Date  . Anxiety   . Cervical dysplasia 1990   CIN 2 subsequent cryosurgery. All Paps normal afterwards  . Dyslipidemia   . Herpes genitalis   . Herpes labialis   . Hypercholesteremia   . IBS (irritable bowel syndrome)   . Osteopenia 04/2014   T score -1.7 FRAX 7.3%/0.8% stable from prior DEXA 2013  . Raynaud disease    Past Surgical History:  Procedure Laterality Date  . AUGMENTATION MAMMAPLASTY    . BREAST ENHANCEMENT SURGERY  2006  . COSMETIC SURGERY    . GYNECOLOGIC CRYOSURGERY  1990  . LIGAMENT REPAIR Right 09/24/2015   Procedure: RIGHT INDEX METACARPAL PHALANGEAL RADIAL COLLATERAL LIGAMENT REPAIR;  Surgeon: Leanora Cover, MD;  Location: [facility];  Service: Orthopedics;  Laterality: Right;  . OOPHORECTOMY     BSO  . TONSILLECTOMY AND ADENOIDECTOMY    . TUBAL LIGATION    . UPPER GASTROINTESTINAL ENDOSCOPY  04/24/05  . VAGINAL HYSTERECTOMY  2005   [facility] BSO  adenomyosis   Social History   Social History  . Marital status: Married    Spouse name: N/A  . Number of children: N/A  . Years of education: N/A   Social History Main  Topics  . Smoking status: Former Smoker    Quit date: 03/21/2006  . Smokeless tobacco: Never Used  . Alcohol use No     Comment: drinking in 1993  . Drug use: No  . Sexual activity: Yes    Birth control/ protection: Surgical     Comment: HYST-1st intercourse 61 yo-More than 5 partners   Other Topics Concern  . Not on file   Social History Narrative  . No narrative on file   Allergies  Allergen Reactions  . Morphine And Related Nausea Only   Family History  Problem Relation Age of Onset  . Hypertension Mother   . Cancer Father 69       Lymphoma  . Breast cancer Sister 34  . Colon cancer Neg Hx      Past medical history, social, surgical and family history all reviewed in electronic medical record.  No pertanent information unless stated regarding to the chief complaint.   Review of Systems:Review of systems updated and as accurate as of 08/04/17  No headache, visual changes, nausea, vomiting, diarrhea, constipation, dizziness, abdominal pain, skin rash, fevers, chills, night sweats, weight loss, swollen lymph nodes, body aches, joint swelling, chest pain, shortness of breath, mood changes. Positive muscle aches  Objective  There were no vitals taken for this visit. Systems examined below as of 08/04/17   General: No  apparent distress alert and oriented x3 mood and affect normal, dressed appropriately.  HEENT: Pupils equal, extraocular movements intact  Respiratory: Patient's speak in full sentences and does not appear short of breath  Cardiovascular: No lower extremity edema, non tender, no erythema  Skin: Warm dry intact with no signs of infection or rash on extremities or on axial skeleton.  Abdomen: Soft nontender  Neuro: Cranial nerves II through XII are intact, neurovascularly intact in all extremities with 2+ DTRs and 2+ pulses.  Lymph: No lymphadenopathy of posterior or anterior cervical chain or axillae bilaterally.  Gait normal with good balance and  coordination.  MSK:  Non tender with full range of motion and good stability and symmetric strength and tone of shoulders, elbows, wrist, hip, and ankles bilaterally.  Knee: Right Normal to inspection with no erythema or effusion or obvious bony abnormalities. Palpation normal with no warmth, joint line tenderness, patellar tenderness, or condyle tenderness. Mild limitation in range of motion lacking last 10 of flexion and 5 of extension. Ligaments with solid consistent endpoints including ACL, PCL, LCL, MCL. Positive Mcmurray's, Apley's, and Thessalonian tests. painful patellar compression. Patellar glide with moderate crepitus. Patellar and quadriceps tendons unremarkable. Hamstring and quadriceps strength is normal.  Contralateral knee unremarkable Back Exam:  Inspection: Mild loss of lordosis Motion: Flexion 45 deg, Extension 25 deg, Side Bending to 45 deg bilaterally,  Rotation to 45 deg bilaterally  SLR laying: Negative  XSLR laying: Negative  Palpable tenderness: Tender to palpation of the paraspinal musculature lumbar spine right greater than left. FABER: Tightness bilaterally. Sensory change: Gross sensation intact to all lumbar and sacral dermatomes.  Reflexes: 2+ at both patellar tendons, 2+ at achilles tendons, Babinski's downgoing.  Strength at foot  Plantar-flexion: 5/5 Dorsi-flexion: 5/5 Eversion: 5/5 Inversion: 5/5  Leg strength  Quad: 5/5 Hamstring: 5/5 Hip flexor: 5/5 Hip abductors: 5/5  Gait unremarkable.  Osteopathic findings  T3 extended rotated and side bent right inhaled third rib T9 extended rotated and side bent left L2 flexed rotated and side bent right Sacrum right on right    Impression and Recommendations:     This case required medical decision making of moderate complexity.      Note: This dictation was prepared with Dragon dictation along with smaller phrase technology. Any transcriptional errors that result from this process are  unintentional.

## 2017-08-05 ENCOUNTER — Encounter: Payer: Self-pay | Admitting: Family Medicine

## 2017-08-05 ENCOUNTER — Ambulatory Visit (INDEPENDENT_AMBULATORY_CARE_PROVIDER_SITE_OTHER): Payer: 59 | Admitting: Family Medicine

## 2017-08-05 VITALS — BP 130/70 | HR 57 | Ht 65.0 in | Wt 125.0 lb

## 2017-08-05 DIAGNOSIS — M999 Biomechanical lesion, unspecified: Secondary | ICD-10-CM | POA: Diagnosis not present

## 2017-08-05 DIAGNOSIS — M545 Low back pain, unspecified: Secondary | ICD-10-CM

## 2017-08-05 DIAGNOSIS — G8929 Other chronic pain: Secondary | ICD-10-CM | POA: Diagnosis not present

## 2017-08-05 DIAGNOSIS — S83281A Other tear of lateral meniscus, current injury, right knee, initial encounter: Secondary | ICD-10-CM

## 2017-08-05 HISTORY — DX: Biomechanical lesion, unspecified: M99.9

## 2017-08-05 NOTE — Assessment & Plan Note (Signed)
Decision today to treat with OMT was based on Physical Exam  After verbal consent patient was treated with HVLA, ME, FPR techniques in  thoracic, lumbar and sacral areas  Patient tolerated the procedure well with improvement in symptoms  Patient given exercises, stretches and lifestyle modifications  See medications in patient instructions if given  Patient will follow up in 4 weeks 

## 2017-08-05 NOTE — Assessment & Plan Note (Signed)
Affecting daily activities at this point. Patient is unable to work out. Failed all conservative therapy including injections, bracing, and formal physical therapy. Still having some internal derangement 90 refill advance imaging is warranted. Patient may have a component of patellofemoral arthritis and can be candidate for viscous supplementation but also could have a large meniscal tear that could need arthroscopic surgery. Patient will follow-up after the MRI and discuss further evaluation.

## 2017-08-05 NOTE — Patient Instructions (Signed)
Good to see you  We will get MRI  We tried manipulation today  See em again in 3-4 weeks.

## 2017-08-05 NOTE — Assessment & Plan Note (Signed)
Patient states and has had chronic pain for quite some time. Describes pain as a dull, throbbing aching pain. Patient does have known facet arthritis. Patient did respond fairly well to the pronation. Follow-up with me again in 4-6 weeks

## 2017-08-15 ENCOUNTER — Ambulatory Visit
Admission: RE | Admit: 2017-08-15 | Discharge: 2017-08-15 | Disposition: A | Discharge status: Home / Self Care | Payer: 59 | Source: Ambulatory Visit | Attending: Family Medicine | Admitting: Family Medicine

## 2017-08-15 DIAGNOSIS — S83281A Other tear of lateral meniscus, current injury, right knee, initial encounter: Secondary | ICD-10-CM

## 2017-08-15 DIAGNOSIS — M25561 Pain in right knee: Secondary | ICD-10-CM | POA: Diagnosis not present

## 2017-08-17 ENCOUNTER — Encounter: Payer: Self-pay | Admitting: Family Medicine

## 2017-08-17 DIAGNOSIS — S83281A Other tear of lateral meniscus, current injury, right knee, initial encounter: Secondary | ICD-10-CM

## 2017-08-25 DIAGNOSIS — M25561 Pain in right knee: Secondary | ICD-10-CM | POA: Diagnosis not present

## 2017-09-09 DIAGNOSIS — S83271A Complex tear of lateral meniscus, current injury, right knee, initial encounter: Secondary | ICD-10-CM | POA: Diagnosis not present

## 2017-09-09 DIAGNOSIS — M1711 Unilateral primary osteoarthritis, right knee: Secondary | ICD-10-CM | POA: Diagnosis not present

## 2017-09-09 DIAGNOSIS — M23241 Derangement of anterior horn of lateral meniscus due to old tear or injury, right knee: Secondary | ICD-10-CM | POA: Diagnosis not present

## 2017-09-09 DIAGNOSIS — G8918 Other acute postprocedural pain: Secondary | ICD-10-CM | POA: Diagnosis not present

## 2017-09-09 HISTORY — PX: OTHER SURGICAL HISTORY: SHX169

## 2017-09-17 DIAGNOSIS — Z4789 Encounter for other orthopedic aftercare: Secondary | ICD-10-CM | POA: Diagnosis not present

## 2017-09-17 DIAGNOSIS — M25561 Pain in right knee: Secondary | ICD-10-CM | POA: Diagnosis not present

## 2017-09-27 DIAGNOSIS — M25561 Pain in right knee: Secondary | ICD-10-CM | POA: Diagnosis not present

## 2017-09-27 DIAGNOSIS — Z4789 Encounter for other orthopedic aftercare: Secondary | ICD-10-CM | POA: Diagnosis not present

## 2017-10-04 ENCOUNTER — Other Ambulatory Visit: Payer: 59

## 2017-10-04 DIAGNOSIS — Z4789 Encounter for other orthopedic aftercare: Secondary | ICD-10-CM | POA: Diagnosis not present

## 2017-10-04 DIAGNOSIS — M25561 Pain in right knee: Secondary | ICD-10-CM | POA: Diagnosis not present

## 2017-10-06 DIAGNOSIS — M25561 Pain in right knee: Secondary | ICD-10-CM | POA: Diagnosis not present

## 2017-10-06 DIAGNOSIS — Z4789 Encounter for other orthopedic aftercare: Secondary | ICD-10-CM | POA: Diagnosis not present

## 2017-10-08 DIAGNOSIS — Z9889 Other specified postprocedural states: Secondary | ICD-10-CM | POA: Diagnosis not present

## 2017-10-08 DIAGNOSIS — M25561 Pain in right knee: Secondary | ICD-10-CM | POA: Diagnosis not present

## 2017-11-01 ENCOUNTER — Telehealth: Payer: Self-pay

## 2017-11-01 NOTE — Telephone Encounter (Signed)
Spoke with pt- she wants to wait on the Shingrix vaccine, had bad flu side effects.

## 2017-11-05 DIAGNOSIS — Z85828 Personal history of other malignant neoplasm of skin: Secondary | ICD-10-CM | POA: Diagnosis not present

## 2017-11-05 DIAGNOSIS — L570 Actinic keratosis: Secondary | ICD-10-CM | POA: Diagnosis not present

## 2017-11-05 DIAGNOSIS — L812 Freckles: Secondary | ICD-10-CM | POA: Diagnosis not present

## 2017-11-05 DIAGNOSIS — L821 Other seborrheic keratosis: Secondary | ICD-10-CM | POA: Diagnosis not present

## 2017-11-10 DIAGNOSIS — M25561 Pain in right knee: Secondary | ICD-10-CM | POA: Diagnosis not present

## 2017-11-12 ENCOUNTER — Encounter: Payer: Self-pay | Admitting: Family Medicine

## 2017-11-17 NOTE — Progress Notes (Addendum)
[name] [address], [place] [address] Phone: [phone] Subjective:     CC: Right knee pain  [id number]  Bethany [name] is a 62 y.o. female coming in for right knee pain. She had surgery in November on the 8th. She was doing physical therapy but is having a hard time with pain as it is constant. She has sharp, achy, and dull pain. She did have a cortizone injection and tried prednisone which have not helped her pain. Has been told that she could try viscous supplementation.  States that the patient has some difficulty with range of motion.  Patient unable to straighten knee.  Patient has some instability and feels like she can even walk regularly     Past Medical History:  Diagnosis Date  . Anxiety   . Cervical dysplasia 1990   CIN 2 subsequent cryosurgery. All Paps normal afterwards  . Dyslipidemia   . Herpes genitalis   . Herpes labialis   . Hypercholesteremia   . IBS (irritable bowel syndrome)   . Osteopenia 04/2014   T score -1.7 FRAX 7.3%/0.8% stable from prior DEXA 2013  . Raynaud disease    Past Surgical History:  Procedure Laterality Date  . AUGMENTATION MAMMAPLASTY    . BREAST ENHANCEMENT SURGERY  2006  . COSMETIC SURGERY    . GYNECOLOGIC CRYOSURGERY  1990  . LIGAMENT REPAIR Right 09/24/2015   Procedure: RIGHT INDEX METACARPAL PHALANGEAL RADIAL COLLATERAL LIGAMENT REPAIR;  Surgeon: Leanora Cover, MD;  Location: [facility];  Service: Orthopedics;  Laterality: Right;  . OOPHORECTOMY     BSO  . Right knee arthoscopy Right 09/09/2017   [facility]  . TONSILLECTOMY AND ADENOIDECTOMY    . TUBAL LIGATION    . UPPER GASTROINTESTINAL ENDOSCOPY  04/24/05  . VAGINAL HYSTERECTOMY  2005   [facility] BSO  adenomyosis   Social History   Socioeconomic History  . Marital status: Married    Spouse name: None  . Number of children: None  . Years of education: None  . Highest education level: None  Social Needs    . Financial resource strain: None  . Food insecurity - worry: None  . Food insecurity - inability: None  . Transportation needs - medical: None  . Transportation needs - non-medical: None  Occupational History  . None  Tobacco Use  . Smoking status: Former Smoker    Last attempt to quit: 03/21/2006    Years since quitting: 11.6  . Smokeless tobacco: Never Used  Substance and Sexual Activity  . Alcohol use: No    Alcohol/week: 0.0 oz    Comment: drinking in 1993  . Drug use: No  . Sexual activity: Yes    Birth control/protection: Surgical    Comment: HYST-1st intercourse 62 yo-More than 5 partners  Other Topics Concern  . None  Social History Narrative  . None   Allergies  Allergen Reactions  . Morphine And Related Nausea Only   Family History  Problem Relation Age of Onset  . Hypertension Mother   . Cancer Father 26       Lymphoma  . Breast cancer Sister 55  . Colon cancer Neg Hx      Past medical history, social, surgical and family history all reviewed in electronic medical record.  No pertanent information unless stated regarding to the chief complaint.   Review of Systems:Review of systems updated and as accurate as of 11/18/17  No headache, visual changes,  nausea, vomiting, diarrhea, constipation, dizziness, abdominal pain, skin rash, fevers, chills, night sweats, weight loss, swollen lymph nodes, body aches, chest pain, shortness of breath, mood changes.  Positive muscle ache and joint swelling  Objective  Blood pressure 118/72, height 5\' 5"  (1.651 m), weight 123 lb (55.8 kg). Systems examined below as of 11/18/17   General: No apparent distress alert and oriented x3 mood and affect normal, dressed appropriately.  HEENT: Pupils equal, extraocular movements intact  Respiratory: Patient's speak in full sentences and does not appear short of breath  Cardiovascular: No lower extremity edema, non tender, no erythema  Skin: Warm dry intact with no signs of  infection or rash on extremities or on axial skeleton.  Abdomen: Soft nontender  Neuro: Cranial nerves II through XII are intact, neurovascularly intact in all extremities with 2+ DTRs and 2+ pulses.  Lymph: No lymphadenopathy of posterior or anterior cervical chain or axillae bilaterally.  Gait normal with good balance and coordination.  MSK:  Non tender with full range of motion and good stability and symmetric strength and tone of shoulders, elbows, wrist, hip and ankles bilaterally.  Knee: Right Positive effusion noted Tender to palpation over the medial joint line Lacks last 5 degrees of extension Moderate instability with stress on the exam Negative Mcmurray's, Apley's, and Thessalonian tests. Mild painful patellar compression. Patellar glide with moderate crepitus. Patellar and quadriceps tendons unremarkable. Hamstring and quadriceps strength is normal. Contralateral knee unremarkable  MSK US performed of: Right knee This study was ordered, performed, and interpreted by Charlann Boxer D.O.  Knee: Medial joint line just proximal to the joint seems to have a very small callus formation that could be potentially microfracture with hypoechoic changes.  Joint effusion noted.  Post meniscectomy changes are noted.  Hypoechoic changes still noted.  Moderate narrowing of the medial joint line  IMPRESSION: Possible stress reaction of the distal femur and continued inflammatory response post meniscectomy    Impression and Recommendations:     This case required medical decision making of moderate complexity.      Note: This dictation was prepared with Dragon dictation along with smaller phrase technology. Any transcriptional errors that result from this process are unintentional.

## 2017-11-18 ENCOUNTER — Encounter: Payer: Self-pay | Admitting: Family Medicine

## 2017-11-18 ENCOUNTER — Ambulatory Visit: Payer: Self-pay

## 2017-11-18 ENCOUNTER — Ambulatory Visit: Payer: 59 | Admitting: Family Medicine

## 2017-11-18 VITALS — BP 118/72 | Ht 65.0 in | Wt 123.0 lb

## 2017-11-18 DIAGNOSIS — M1711 Unilateral primary osteoarthritis, right knee: Secondary | ICD-10-CM | POA: Diagnosis not present

## 2017-11-18 DIAGNOSIS — M1712 Unilateral primary osteoarthritis, left knee: Secondary | ICD-10-CM | POA: Insufficient documentation

## 2017-11-18 DIAGNOSIS — G8929 Other chronic pain: Secondary | ICD-10-CM

## 2017-11-18 DIAGNOSIS — M25561 Pain in right knee: Secondary | ICD-10-CM

## 2017-11-18 MED ORDER — VITAMIN D (ERGOCALCIFEROL) 1.25 MG (50000 UNIT) PO CAPS: 50000.0000 [IU] | ORAL_CAPSULE | ORAL | 0 refills | Status: DC

## 2017-11-18 NOTE — Patient Instructions (Addendum)
Good to see you  Lets take it easy next 2 weeks  We will try to get a pproval for the injections.  pennsaid pinkie amount topically 2 times daily as needed.   Once weekly vitamin D again K2 over the counter will help as well.  We will start the process on the knee brace as well and you should get a call  We will discuss soon

## 2017-11-18 NOTE — Assessment & Plan Note (Signed)
Patient does have degenerative arthritis of the knee.  Did have a meniscal removal is likely contributing.  Patient though does have some limited range of motion still noted.  Discussed with patient in great length.  Has already had another steroid injection and has been doing formal physical therapy.  We discussed the possibility of repeating advanced imaging but this would likely involve surgical intervention.  Does have some mild instability and decreased range of motion.  Due to abnormal thigh to calf ratio patient could be a candidate for custom brace for more stability which will be ordered today.  Attempting to get approval for Visco supplementation.  Follow-up again after approval to start injection process.

## 2017-11-21 ENCOUNTER — Encounter: Payer: Self-pay | Admitting: Family Medicine

## 2017-11-24 DIAGNOSIS — H04123 Dry eye syndrome of bilateral lacrimal glands: Secondary | ICD-10-CM | POA: Diagnosis not present

## 2017-11-25 ENCOUNTER — Encounter: Payer: Self-pay | Admitting: Family Medicine

## 2017-11-25 DIAGNOSIS — M1711 Unilateral primary osteoarthritis, right knee: Secondary | ICD-10-CM | POA: Diagnosis not present

## 2017-11-25 NOTE — Telephone Encounter (Signed)
Copied from [place] [id number]. Topic: General - Other >> Nov 25, 2017 10:39 AM Marin Olp L wrote: Reason for CRM: Patient calling to check on status of getting her insurance to authorize knee injection w/ Dr. Creig Hines. Please advise. Patient is in a lot of pain.

## 2017-11-28 NOTE — Progress Notes (Signed)
[name] [place], [place] [address] Phone: [phone] Subjective:    I'm seeing this patient by the request  of:    CC: Knee pain follow-up  [id number]  Bethany [name] is a 62 y.o. female coming in with complaint of knee pain.  Patient was found to have a meniscal tear and patellofemoral syndrome.  Patient did have a focal area of full-thickness cartilage loss on MRI.  Patient elected to try arthroscopic surgery but has not had good results.  Continues to have significant pain and is having difficulty even with ambulation.  Failed all conservative therapy including a steroid injection.  Patient has done formal physical therapy.  Patient states still having limited range of motion.  Still having pain daily.    Past Medical History:  Diagnosis Date  . Anxiety   . Cervical dysplasia 1990   CIN 2 subsequent cryosurgery. All Paps normal afterwards  . Dyslipidemia   . Herpes genitalis   . Herpes labialis   . Hypercholesteremia   . IBS (irritable bowel syndrome)   . Osteopenia 04/2014   T score -1.7 FRAX 7.3%/0.8% stable from prior DEXA 2013  . Raynaud disease    Past Surgical History:  Procedure Laterality Date  . AUGMENTATION MAMMAPLASTY    . BREAST ENHANCEMENT SURGERY  2006  . COSMETIC SURGERY    . GYNECOLOGIC CRYOSURGERY  1990  . LIGAMENT REPAIR Right 09/24/2015   Procedure: RIGHT INDEX METACARPAL PHALANGEAL RADIAL COLLATERAL LIGAMENT REPAIR;  Surgeon: Bethany Cover, MD;  Location: [facility];  Service: Orthopedics;  Laterality: Right;  . OOPHORECTOMY     BSO  . Right knee arthoscopy Right 09/09/2017   [facility]  . TONSILLECTOMY AND ADENOIDECTOMY    . TUBAL LIGATION    . UPPER GASTROINTESTINAL ENDOSCOPY  04/24/05  . VAGINAL HYSTERECTOMY  2005   [facility] BSO  adenomyosis   Social History   Socioeconomic History  . Marital status: Married    Spouse name: None  . Number of children: None  . Years of  education: None  . Highest education level: None  Social Needs  . Financial resource strain: None  . Food insecurity - worry: None  . Food insecurity - inability: None  . Transportation needs - medical: None  . Transportation needs - non-medical: None  Occupational History  . None  Tobacco Use  . Smoking status: Former Smoker    Last attempt to quit: 03/21/2006    Years since quitting: 11.7  . Smokeless tobacco: Never Used  Substance and Sexual Activity  . Alcohol use: No    Alcohol/week: 0.0 oz    Comment: drinking in 1993  . Drug use: No  . Sexual activity: Yes    Birth control/protection: Surgical    Comment: HYST-1st intercourse 62 yo-More than 5 partners  Other Topics Concern  . None  Social History Narrative  . None   Allergies  Allergen Reactions  . Morphine And Related Nausea Only   Family History  Problem Relation Age of Onset  . Hypertension Mother   . Cancer Father 79       Lymphoma  . Breast cancer Sister 14  . Colon cancer Neg Hx      Past medical history, social, surgical and family history all reviewed in electronic medical record.  No pertanent information unless stated regarding to the chief complaint.   Review of Systems:Review of systems updated and as accurate as of 11/29/17  No headache, visual changes, nausea, vomiting, diarrhea, constipation, dizziness, abdominal pain, skin rash, fevers, chills, night sweats, weight loss, swollen lymph nodes, body aches, joint swelling, muscle aches, chest pain, shortness of breath, mood changes.   Objective  Blood pressure 110/84, pulse 73, height 5\' 5"  (1.651 m), weight 123 lb (55.8 kg), SpO2 96 %. Systems examined below as of 11/29/17   General: No apparent distress alert and oriented x3 mood and affect normal, dressed appropriately.  HEENT: Pupils equal, extraocular movements intact  Respiratory: Patient's speak in full sentences and does not appear short of breath  Cardiovascular: No lower extremity  edema, non tender, no erythema  Skin: Warm dry intact with no signs of infection or rash on extremities or on axial skeleton.  Abdomen: Soft nontender  Neuro: Cranial nerves II through XII are intact, neurovascularly intact in all extremities with 2+ DTRs and 2+ pulses.  Lymph: No lymphadenopathy of posterior or anterior cervical chain or axillae bilaterally.  Gait normal with good balance and coordination.  MSK:  Non tender with full range of motion and good stability and symmetric strength and tone of shoulders, elbows, wrist, hip, and ankles bilaterally.  Knee: Right Effusion noted Tender over the medial joint line Range of motion is decreased last 5 degrees of extension in the last 15 degrees of flexion Mild instability with valgus force.  Abnormal thigh to calf ratio Negative Mcmurray's, Apley's, and Thessalonian tests. painful patellar compression. Patellar glide with mild to moderate crepitus. Patellar and quadriceps tendons unremarkable. Hamstring and quadriceps strength is normal.  Contralateral knee unremarkable  After informed written and verbal consent, patient was seated on exam table. Right knee was prepped with alcohol swab and utilizing anterolateral approach, patient's right knee space was injected with 22mg /mL of Monovisc (sodium hyaluronate) in a prefilled syringe was injected easily into the knee through a 22-gauge needle..Patient tolerated the procedure well without immediate complications.   Impression and Recommendations:     This case required medical decision making of moderate complexity.      Note: This dictation was prepared with Dragon dictation along with smaller phrase technology. Any transcriptional errors that result from this process are unintentional.

## 2017-11-29 ENCOUNTER — Ambulatory Visit: Payer: 59 | Admitting: Family Medicine

## 2017-11-29 ENCOUNTER — Encounter: Payer: Self-pay | Admitting: Family Medicine

## 2017-11-29 DIAGNOSIS — M1712 Unilateral primary osteoarthritis, left knee: Secondary | ICD-10-CM

## 2017-11-29 NOTE — Assessment & Plan Note (Signed)
Visco supplementation given today.  Hopefully this will be well tolerated.  Patient does have a trip planned in March.  Patient having worsening pain we will consider.  Patient is also being fitted for custom brace secondary to abnormal thigh to calf ratio.

## 2017-11-29 NOTE — Patient Instructions (Signed)
Doing the monovisc and really hope this make a difference.  Stay active but more for range of motion and keeping knee in one plane See me again in 4 weeks

## 2017-12-07 ENCOUNTER — Encounter: Payer: Self-pay | Admitting: Family Medicine

## 2017-12-07 DIAGNOSIS — G894 Chronic pain syndrome: Secondary | ICD-10-CM | POA: Diagnosis not present

## 2017-12-07 DIAGNOSIS — M25561 Pain in right knee: Secondary | ICD-10-CM

## 2017-12-11 ENCOUNTER — Ambulatory Visit
Admission: RE | Admit: 2017-12-11 | Discharge: 2017-12-11 | Disposition: A | Discharge status: Home / Self Care | Payer: 59 | Source: Ambulatory Visit | Attending: Family Medicine | Admitting: Family Medicine

## 2017-12-11 DIAGNOSIS — S83281A Other tear of lateral meniscus, current injury, right knee, initial encounter: Secondary | ICD-10-CM | POA: Diagnosis not present

## 2017-12-11 DIAGNOSIS — M25561 Pain in right knee: Secondary | ICD-10-CM

## 2017-12-12 ENCOUNTER — Encounter: Payer: Self-pay | Admitting: Family Medicine

## 2017-12-13 ENCOUNTER — Ambulatory Visit: Payer: 59 | Admitting: Family Medicine

## 2017-12-13 DIAGNOSIS — M25561 Pain in right knee: Secondary | ICD-10-CM

## 2017-12-13 DIAGNOSIS — S83281S Other tear of lateral meniscus, current injury, right knee, sequela: Secondary | ICD-10-CM

## 2017-12-13 NOTE — Assessment & Plan Note (Signed)
Recurrent lateral meniscal tear noted.  Now more of the mid substance portion.  This does have some protrusion that is likely causing the locking aspect of this.  Patient does have a trip planned in 7 weeks.  Patient will be doing a lot of walking.  Would consider surgical intervention.  Discussed with her at great length for greater than 25 minutes.  Patient has been referred back to the orthopedic surgeon to discuss another possible arthroscopic procedure.  Patient is in agreement with plan and all questions were answered today.

## 2017-12-13 NOTE — Patient Instructions (Addendum)
Good to see you as always We will try to get this meniscus taken care of in the next week or so.  Continue the brace for now and likely you will need I t  Keep me updated on what is gong on but I will talk to [place] as well.

## 2017-12-13 NOTE — Progress Notes (Signed)
[name] [place], [place] [address] Phone: [phone] Subjective:    I'm seeing this patient by the request  of:    CC: Right knee pain  [id number]  Bethany [name] is a 62 y.o. female coming in with complaint of right knee pain.  Found to have a meniscal tear.  Patient did have surgical intervention done in November.  Fortunately did not do great with the conservative therapy.  Moderate osteoarthritic changes as well and attempted Monovisc with no skin.  Continue to have instability and was given a custom brace.  Continue to have difficulty with full extension of the knee.  Repeat MRI was done.  MRI was independently visualized by me showing a mid substance lateral meniscal tear with protrusion into the lateral recess.       Past Medical History:  Diagnosis Date  . Anxiety   . Cervical dysplasia 1990   CIN 2 subsequent cryosurgery. All Paps normal afterwards  . Dyslipidemia   . Herpes genitalis   . Herpes labialis   . Hypercholesteremia   . IBS (irritable bowel syndrome)   . Osteopenia 04/2014   T score -1.7 FRAX 7.3%/0.8% stable from prior DEXA 2013  . Raynaud disease    Past Surgical History:  Procedure Laterality Date  . AUGMENTATION MAMMAPLASTY    . BREAST ENHANCEMENT SURGERY  2006  . COSMETIC SURGERY    . GYNECOLOGIC CRYOSURGERY  1990  . LIGAMENT REPAIR Right 09/24/2015   Procedure: RIGHT INDEX METACARPAL PHALANGEAL RADIAL COLLATERAL LIGAMENT REPAIR;  Surgeon: Leanora Cover, MD;  Location: [facility];  Service: Orthopedics;  Laterality: Right;  . OOPHORECTOMY     BSO  . Right knee arthoscopy Right 09/09/2017   [facility]  . TONSILLECTOMY AND ADENOIDECTOMY    . TUBAL LIGATION    . UPPER GASTROINTESTINAL ENDOSCOPY  04/24/05  . VAGINAL HYSTERECTOMY  2005   [facility] BSO  adenomyosis   Social History   Socioeconomic History  . Marital status: Married    Spouse name: Not on file  . Number of  children: Not on file  . Years of education: Not on file  . Highest education level: Not on file  Social Needs  . Financial resource strain: Not on file  . Food insecurity - worry: Not on file  . Food insecurity - inability: Not on file  . Transportation needs - medical: Not on file  . Transportation needs - non-medical: Not on file  Occupational History  . Not on file  Tobacco Use  . Smoking status: Former Smoker    Last attempt to quit: 03/21/2006    Years since quitting: 11.7  . Smokeless tobacco: Never Used  Substance and Sexual Activity  . Alcohol use: No    Alcohol/week: 0.0 oz    Comment: drinking in 1993  . Drug use: No  . Sexual activity: Yes    Birth control/protection: Surgical    Comment: HYST-1st intercourse 62 yo-More than 5 partners  Other Topics Concern  . Not on file  Social History Narrative  . Not on file   Allergies  Allergen Reactions  . Morphine And Related Nausea Only   Family History  Problem Relation Age of Onset  . Hypertension Mother   . Cancer Father 20       Lymphoma  . Breast cancer Sister 21  . Colon cancer Neg Hx      Past medical history, social, surgical and family history  all reviewed in electronic medical record.  No pertanent information unless stated regarding to the chief complaint.   Review of Systems:Review of systems updated and as accurate as of 12/13/17  No headache, visual changes, nausea, vomiting, diarrhea, constipation, dizziness, abdominal pain, skin rash, fevers, chills, night sweats, weight loss, swollen lymph nodes, body aches, joint swelling, muscle aches, chest pain, shortness of breath, mood changes.   Objective  Blood pressure 120/80, height 5\' 5"  (1.651 m), weight 123 lb (55.8 kg). Systems examined below as of 12/13/17   General: No apparent distress alert and oriented x3 mood and affect normal, dressed appropriately.  HEENT: Pupils equal, extraocular movements intact  Respiratory: Patient's speak in full  sentences and does not appear short of breath  Cardiovascular: No lower extremity edema, non tender, no erythema  Skin: Warm dry intact with no signs of infection or rash on extremities or on axial skeleton.  Abdomen: Soft nontender  Neuro: Cranial nerves II through XII are intact, neurovascularly intact in all extremities with 2+ DTRs and 2+ pulses.  Lymph: No lymphadenopathy of posterior or anterior cervical chain or axillae bilaterally.  Gait normal with good balance and coordination.  MSK:  Non tender with full range of motion and good stability and symmetric strength and tone of shoulders, elbows, wrist, hip and ankles bilaterally.  Patient's right knee exam shows that she is lacking of flexion.  Mild laxity still with the LCL noted.  Patient does have some crepitus with range of motion and a positive patellar grind test.  Patient does have a positive McMurray's.  Neurovascularly intact distally.    Impression and Recommendations:     This case required medical decision making of moderate complexity.      Note: This dictation was prepared with Dragon dictation along with smaller phrase technology. Any transcriptional errors that result from this process are unintentional.

## 2017-12-16 DIAGNOSIS — G8918 Other acute postprocedural pain: Secondary | ICD-10-CM | POA: Diagnosis not present

## 2017-12-16 DIAGNOSIS — S83271A Complex tear of lateral meniscus, current injury, right knee, initial encounter: Secondary | ICD-10-CM | POA: Diagnosis not present

## 2017-12-16 DIAGNOSIS — M948X6 Other specified disorders of cartilage, lower leg: Secondary | ICD-10-CM | POA: Diagnosis not present

## 2017-12-16 DIAGNOSIS — M1711 Unilateral primary osteoarthritis, right knee: Secondary | ICD-10-CM | POA: Diagnosis not present

## 2017-12-17 ENCOUNTER — Encounter: Payer: Self-pay | Admitting: Family Medicine

## 2017-12-30 MED FILL — GABAPENTIN 600 MG TABLET: 600 | 30 days supply | Qty: 120 | Fill #0

## 2017-12-31 MED FILL — lamoTRIgine 25 MG TABS: 25 | 30 days supply | Qty: 180 | Fill #0

## 2017-12-31 MED FILL — VIT D2 1.25 MG (50,000 UNIT: 1.25 MG | 28 days supply | Qty: 4 | Fill #0

## 2018-01-03 ENCOUNTER — Encounter: Payer: Self-pay | Admitting: Family Medicine

## 2018-01-03 MED ORDER — ESTRADIOL 1 MG PO TABS: 1.0000 mg | ORAL_TABLET | Freq: Every day | ORAL | 1 refills | Status: DC

## 2018-01-03 MED FILL — ESTRADIOL 1 MG TAB: 1 | 30 days supply | Qty: 30 | Fill #0

## 2018-01-04 ENCOUNTER — Other Ambulatory Visit: Payer: Self-pay

## 2018-01-04 ENCOUNTER — Telehealth: Payer: Self-pay | Admitting: Internal Medicine

## 2018-01-04 DIAGNOSIS — E785 Hyperlipidemia, unspecified: Secondary | ICD-10-CM

## 2018-01-04 MED ORDER — SIMVASTATIN 20 MG PO TABS: 20.0000 mg | ORAL_TABLET | Freq: Every day | ORAL | 3 refills | Status: DC

## 2018-01-04 MED FILL — ZUBSOLV 0.7-0.18 MG SUBL: 0.7-0.18 | 30 days supply | Qty: 15 | Fill #0

## 2018-01-04 MED FILL — SIMVASTATIN 20 MG TABS: 20 | 30 days supply | Qty: 30 | Fill #0

## 2018-01-04 NOTE — Telephone Encounter (Signed)
Called pt to let her know that she had until April 3rd to get 2nd shringrix. She will think about it and let us know cause she had arm pain with 1st one

## 2018-01-13 DIAGNOSIS — L821 Other seborrheic keratosis: Secondary | ICD-10-CM | POA: Diagnosis not present

## 2018-01-18 MED FILL — GABAPENTIN 600 MG TABLET: 600 | 30 days supply | Qty: 120 | Fill #0

## 2018-01-18 MED FILL — lamoTRIgine 25 MG TABS: 25 | 30 days supply | Qty: 210 | Fill #0

## 2018-01-19 DIAGNOSIS — M25561 Pain in right knee: Secondary | ICD-10-CM | POA: Diagnosis not present

## 2018-02-03 MED FILL — SIMVASTATIN 20 MG TABS: 20 | 30 days supply | Qty: 30 | Fill #1

## 2018-02-03 MED FILL — traZODone HCL 50 MG TABS: 50 | 15 days supply | Qty: 15 | Fill #0

## 2018-02-03 MED FILL — PROPRANOLOL 10 MG TABLET: 10 | 8 days supply | Qty: 15 | Fill #0

## 2018-02-09 MED FILL — VIT D2 1.25 MG (50,000 UNIT: 1.25 MG | 28 days supply | Qty: 4 | Fill #1

## 2018-02-24 ENCOUNTER — Other Ambulatory Visit: Payer: Self-pay | Admitting: Family Medicine

## 2018-02-24 ENCOUNTER — Other Ambulatory Visit: Payer: Self-pay | Admitting: Orthopaedic Surgery

## 2018-02-24 DIAGNOSIS — M25561 Pain in right knee: Secondary | ICD-10-CM

## 2018-02-24 MED FILL — ESTRADIOL 1 MG TABS: 1 | 30 days supply | Qty: 30 | Fill #1

## 2018-02-24 MED FILL — GABAPENTIN 600 MG TABLET: 600 | 30 days supply | Qty: 120 | Fill #1

## 2018-02-25 NOTE — Telephone Encounter (Signed)
Refill done.  

## 2018-02-26 MED FILL — SIMVASTATIN 20 MG TABS: 20 | 30 days supply | Qty: 30 | Fill #2

## 2018-02-27 ENCOUNTER — Ambulatory Visit
Admission: RE | Admit: 2018-02-27 | Discharge: 2018-02-27 | Disposition: A | Discharge status: Home / Self Care | Payer: 59 | Source: Ambulatory Visit | Attending: Orthopaedic Surgery | Admitting: Orthopaedic Surgery

## 2018-02-27 DIAGNOSIS — M25561 Pain in right knee: Secondary | ICD-10-CM | POA: Diagnosis not present

## 2018-02-28 ENCOUNTER — Encounter: Payer: Self-pay | Admitting: Family Medicine

## 2018-03-02 MED FILL — lamoTRIgine 100 MG TABS: 100 | 30 days supply | Qty: 60 | Fill #0

## 2018-03-03 MED FILL — ATOMOXETINE HCL 25 MG CAP: 25 | 30 days supply | Qty: 30 | Fill #0

## 2018-03-03 MED FILL — VIT D2 1.25 MG (50,000 UNIT: 1.25 MG | 28 days supply | Qty: 4 | Fill #0 | Status: TO

## 2018-03-04 ENCOUNTER — Encounter: Payer: Self-pay | Admitting: Family Medicine

## 2018-03-10 ENCOUNTER — Other Ambulatory Visit: Payer: Self-pay | Admitting: Orthopaedic Surgery

## 2018-03-21 DIAGNOSIS — H00029 Hordeolum internum unspecified eye, unspecified eyelid: Secondary | ICD-10-CM | POA: Diagnosis not present

## 2018-03-21 MED FILL — DOXYCYCLINE MONO 50 MG CAP: 50 | 14 days supply | Qty: 28 | Fill #0

## 2018-03-22 MED FILL — GABAPENTIN 600 MG TABLET: 600 | 30 days supply | Qty: 120 | Fill #2

## 2018-03-31 ENCOUNTER — Encounter (HOSPITAL_COMMUNITY): Payer: Self-pay

## 2018-03-31 ENCOUNTER — Other Ambulatory Visit: Payer: Self-pay

## 2018-03-31 ENCOUNTER — Encounter (HOSPITAL_COMMUNITY)
Admission: RE | Admit: 2018-03-31 | Discharge: 2018-03-31 | Disposition: A | Discharge status: Home / Self Care | Payer: 59 | Source: Ambulatory Visit | Attending: Orthopaedic Surgery | Admitting: Orthopaedic Surgery

## 2018-03-31 DIAGNOSIS — Z01812 Encounter for preprocedural laboratory examination: Secondary | ICD-10-CM | POA: Insufficient documentation

## 2018-03-31 DIAGNOSIS — Z01818 Encounter for other preprocedural examination: Secondary | ICD-10-CM | POA: Diagnosis not present

## 2018-03-31 HISTORY — DX: Unspecified osteoarthritis, unspecified site: M19.90

## 2018-03-31 LAB — TYPE AND SCREEN
ABO/RH(D): O POS
Antibody Screen: NEGATIVE

## 2018-03-31 LAB — CBC WITH DIFFERENTIAL/PLATELET
Abs Immature Granulocytes: 0 K/uL (ref 0.0–0.1)
Basophils Absolute: 0 K/uL (ref 0.0–0.1)
Basophils Relative: 1 %
Eosinophils Absolute: 0.1 K/uL (ref 0.0–0.7)
Eosinophils Relative: 1 %
HCT: 39.2 % (ref 36.0–46.0)
Hemoglobin: 12.6 g/dL (ref 12.0–15.0)
Immature Granulocytes: 0 %
Lymphocytes Relative: 31 %
Lymphs Abs: 1.8 K/uL (ref 0.7–4.0)
MCH: 32.6 pg (ref 26.0–34.0)
MCHC: 32.1 g/dL (ref 30.0–36.0)
MCV: 101.3 fL — ABNORMAL HIGH (ref 78.0–100.0)
Monocytes Absolute: 0.5 K/uL (ref 0.1–1.0)
Monocytes Relative: 8 %
Neutro Abs: 3.5 K/uL (ref 1.7–7.7)
Neutrophils Relative %: 59 %
Platelets: 218 K/uL (ref 150–400)
RBC: 3.87 MIL/uL (ref 3.87–5.11)
RDW: 12.2 % (ref 11.5–15.5)
WBC: 5.9 K/uL (ref 4.0–10.5)

## 2018-03-31 LAB — URINALYSIS, ROUTINE W REFLEX MICROSCOPIC
Bilirubin Urine: NEGATIVE
Glucose, UA: NEGATIVE mg/dL
HGB URINE DIPSTICK: NEGATIVE
Ketones, ur: NEGATIVE mg/dL
LEUKOCYTES UA: NEGATIVE
NITRITE: NEGATIVE
PROTEIN: NEGATIVE mg/dL
SPECIFIC GRAVITY, URINE: 1.016 (ref 1.005–1.030)
pH: 7 (ref 5.0–8.0)

## 2018-03-31 LAB — BASIC METABOLIC PANEL WITH GFR
Anion gap: 9 (ref 5–15)
BUN: 19 mg/dL (ref 6–20)
CO2: 32 mmol/L (ref 22–32)
Calcium: 9.5 mg/dL (ref 8.9–10.3)
Chloride: 101 mmol/L (ref 101–111)
Creatinine, Ser: 1 mg/dL (ref 0.44–1.00)
GFR calc Af Amer: >60 mL/min
GFR calc non Af Amer: 59 mL/min — ABNORMAL LOW
Glucose, Bld: 79 mg/dL (ref 65–99)
Potassium: 4.2 mmol/L (ref 3.5–5.1)
Sodium: 142 mmol/L (ref 135–145)

## 2018-03-31 LAB — SURGICAL PCR SCREEN
MRSA, PCR: NEGATIVE
STAPHYLOCOCCUS AUREUS: NEGATIVE

## 2018-03-31 LAB — PROTIME-INR
INR: 0.92
Prothrombin Time: 12.3 seconds (ref 11.4–15.2)

## 2018-03-31 LAB — ABO/RH: ABO/RH(D): O POS

## 2018-03-31 LAB — APTT: aPTT: 26 s (ref 24–36)

## 2018-03-31 NOTE — Pre-Procedure Instructions (Signed)
KESHONDA MONSOUR  03/31/2018      [facility] - [address], [place] [place] [address] Phone: [phone] Fax: [phone]  [place], [place] - [address] [place] [address] Phone: [phone] Fax: [phone]    Your procedure is scheduled on Tuesday, June 11th   Report to [facility] Admitting at  8:00 AM             (posted surgery time 10a -12noon)   Call this number if you have problems the morning of surgery:  [phone]   Remember:              4-5 days prior to surgery, STOP TAKING any Vitamins, Herbal Supplements, Anti-inflammatories.   Nothing to eat or drink after midnight Monday.    Take these medicines the morning of surgery with A SIP OF WATER ; Gabapentin    Do not wear jewelry, make-up or nail polish.  Do not wear lotions, powders, perfumes, or deodorant.  Do not shave 48 hours prior to surgery.    Do not bring valuables to the hospital.  [facility] is not responsible for any belongings or valuables.  Contacts, dentures or bridgework may not be worn into surgery.  Leave your suitcase in the car.  After surgery it may be brought to your room.  For patients admitted to the hospital, discharge time will be determined by your treatment team.  Please read over the following fact sheets that you were given. Pain Booklet and MRSA Information       [place]- Preparing For Surgery  Before surgery, you can play an important role. Because skin is not sterile, your skin needs to be as free of germs as possible. You can reduce the number of germs on your skin by washing with CHG (chlorahexidine gluconate) Soap before surgery.  CHG is an antiseptic cleaner which kills germs and bonds with the skin to continue killing germs even after washing.    Oral Hygiene is also important to  reduce your risk of infection.  Remember - BRUSH YOUR TEETH THE MORNING OF SURGERY WITH YOUR REGULAR TOOTHPASTE  Please do not use if you have an allergy to CHG or antibacterial soaps. If your skin becomes reddened/irritated stop using the CHG.  Do not shave (including legs and underarms) for at least 48 hours prior to first CHG shower. It is OK to shave your face.  Please follow these instructions carefully.   1. Shower the NIGHT BEFORE SURGERY and the MORNING OF SURGERY with CHG.   2. If you chose to wash your hair, wash your hair first as usual with your normal shampoo.  3. After you shampoo, rinse your hair and body thoroughly to remove the shampoo.  4. Use CHG as you would any other liquid soap. You can apply CHG directly to the skin and wash gently with a scrungie or a clean washcloth.   5. Apply the CHG Soap to your body ONLY FROM THE NECK DOWN.  Do not use on open wounds or open sores. Avoid contact with your eyes, ears, mouth and genitals (private parts). Wash Face and genitals (private parts)  with your normal soap.  6. Wash thoroughly, paying special attention to the area where your surgery will be performed.  7. Thoroughly rinse your body with warm water  from the neck down.  8. DO NOT shower/wash with your normal soap after using and rinsing off the CHG Soap.  9. Pat yourself dry with a CLEAN TOWEL.  10. Wear CLEAN PAJAMAS to bed the night before surgery, wear comfortable clothes the morning of surgery  11. Place CLEAN SHEETS on your bed the night of your first shower and DO NOT SLEEP WITH PETS.    Day of Surgery:  Do not apply any deodorants/lotions.  Please wear clean clothes to the hospital/surgery center.   Remember to brush your teeth WITH YOUR REGULAR TOOTHPASTE.

## 2018-03-31 NOTE — Progress Notes (Signed)
PCP is Dr. Glo Herring   LOV  08/2017  Denies murmur, cp, sob.  No cardiac tests done.

## 2018-04-01 MED FILL — VIT D2 1.25 MG (50,000 UNIT: 1.25 MG | 28 days supply | Qty: 4 | Fill #1 | Status: TO

## 2018-04-01 MED FILL — ESTRADIOL 1 MG TABS: 1 | 30 days supply | Qty: 30 | Fill #2

## 2018-04-01 MED FILL — lamoTRIgine 100 MG TABS: 100 | 30 days supply | Qty: 60 | Fill #1

## 2018-04-01 MED FILL — SIMVASTATIN 20 MG TABS: 20 | 30 days supply | Qty: 30 | Fill #3

## 2018-04-08 DIAGNOSIS — L570 Actinic keratosis: Secondary | ICD-10-CM | POA: Diagnosis not present

## 2018-04-11 MED ORDER — TRANEXAMIC ACID 1000 MG/10ML IV SOLN
2000.0000 mg | INTRAVENOUS | Status: AC
  Administered 2018-04-12: 2000 mg via TOPICAL
  Filled 2018-04-11: qty 20

## 2018-04-11 MED ORDER — LACTATED RINGERS IV SOLN
INTRAVENOUS | Status: DC
  Administered 2018-04-12 (×2): via INTRAVENOUS

## 2018-04-11 MED ORDER — CEFAZOLIN SODIUM-DEXTROSE 2-4 GM/100ML-% IV SOLN
2.0000 g | INTRAVENOUS | Status: AC
  Administered 2018-04-12: 2 g via INTRAVENOUS
  Filled 2018-04-11: qty 100

## 2018-04-11 NOTE — H&P (Signed)
TOTAL KNEE ADMISSION H&P  Patient is being admitted for right total knee arthroplasty.  Subjective:  Chief Complaint:right knee pain.  HPI: Bethany [name], 62 y.o. female, has a history of pain and functional disability in the right knee due to arthritis and has failed non-surgical conservative treatments for greater than 12 weeks to includeNSAID's and/or analgesics, corticosteriod injections, viscosupplementation injections, flexibility and strengthening excercises, supervised PT with diminished ADL's post treatment, use of assistive devices, weight reduction as appropriate and activity modification.  Onset of symptoms was gradual, starting 5 years ago with gradually worsening course since that time. The patient noted prior procedures on the knee to include  arthroscopy on the right knee(s).  Patient currently rates pain in the right knee(s) at 10 out of 10 with activity. Patient has night pain, worsening of pain with activity and weight bearing, pain that interferes with activities of daily living, crepitus and joint swelling.  Patient has evidence of subchondral cysts, subchondral sclerosis, periarticular osteophytes and joint space narrowing by imaging studies. There is no active infection.  Patient Active Problem List   Diagnosis Date Noted  . Degenerative joint disease of knee, left 11/18/2017  . Nonallopathic lesion of thoracic region 08/05/2017  . Nonallopathic lesion of lumbosacral region 08/05/2017  . Nonallopathic lesion of sacral region 08/05/2017  . Acute lateral meniscus tear of right knee 01/06/2017  . Hemorrhoid 04/21/2016  . Tubular adenoma of colon 04/21/2016  . Chronic back pain 09/07/2013  . Mood disorder ([place])   . Hyperlipidemia with target LDL less than 100 06/17/2011  . Raynaud's disease 06/17/2011  . Recurrent herpes labialis 06/17/2011   Past Medical History:  Diagnosis Date  . Anxiety   . Arthritis   . Cervical dysplasia 1990   CIN 2 subsequent cryosurgery.  All Paps normal afterwards  . Dyslipidemia   . Herpes genitalis   . Herpes labialis   . Hypercholesteremia   . IBS (irritable bowel syndrome)   . Osteopenia 04/2014   T score -1.7 FRAX 7.3%/0.8% stable from prior DEXA 2013  . Raynaud disease     Past Surgical History:  Procedure Laterality Date  . AUGMENTATION MAMMAPLASTY    . BREAST ENHANCEMENT SURGERY  2006  . COSMETIC SURGERY    . EYE SURGERY     lasik od  . GYNECOLOGIC CRYOSURGERY  1990  . LIGAMENT REPAIR Right 09/24/2015   Procedure: RIGHT INDEX METACARPAL PHALANGEAL RADIAL COLLATERAL LIGAMENT REPAIR;  Surgeon: Leanora Cover, MD;  Location: [facility];  Service: Orthopedics;  Laterality: Right;  . OOPHORECTOMY     BSO  . Right knee arthoscopy Right 09/09/2017   [facility]  . TONSILLECTOMY AND ADENOIDECTOMY    . TUBAL LIGATION    . UPPER GASTROINTESTINAL ENDOSCOPY  04/24/05  . VAGINAL HYSTERECTOMY  2005   [facility] BSO  adenomyosis    Current Facility-Administered Medications  Medication Dose Route Frequency Provider Last Rate Last Dose  . [START ON 04/12/2018] ceFAZolin (ANCEF) IVPB 2g/100 mL premix  2 g Intravenous To Anson Fret, MD      . Derrill Memo ON 04/12/2018] lactated ringers infusion   Intravenous Continuous Melrose Nakayama, MD      . Derrill Memo ON 04/12/2018] tranexamic acid (CYKLOKAPRON) 2,000 mg in sodium chloride 0.9 % 50 mL Topical Application  0,938 mg Topical To OR Melrose Nakayama, MD       Current Outpatient Medications  Medication Sig Dispense Refill Last Dose  . b complex vitamins tablet Take 1 tablet by mouth  daily.   Taking  . Carboxymethylcellulose Sodium (THERATEARS) 0.25 % SOLN Place 1 drop into both eyes 2 (two) times daily as needed (dry eyes).     . cholecalciferol (VITAMIN D) 1000 UNITS tablet Take 1,000 Units by mouth daily.   Taking  . estradiol (ESTRACE) 1 MG tablet Take 1 tablet (1 mg total) by mouth daily. 90 tablet 1   . gabapentin (NEURONTIN) 600 MG tablet Take 1,200 mg by  mouth 2 (two) times daily.    Taking  . ibuprofen (ADVIL,MOTRIN) 800 MG tablet Take 1 tablet (800 mg total) by mouth every 8 (eight) hours as needed. (Patient taking differently: Take 800 mg by mouth every 8 (eight) hours as needed (pain). ) 30 tablet 3 Taking  . lamoTRIgine (LAMICTAL) 100 MG tablet Take 100 mg by mouth 2 (two) times daily.   0 Taking  . PROCTOZONE-HC 2.5 % rectal cream apply rectally twice a day (Patient taking differently: Apply rectally twice a day as needed for irritations) 30 g 1 Taking  . simvastatin (ZOCOR) 20 MG tablet Take 1 tablet (20 mg total) by mouth daily. 90 tablet 3   . vitamin C (ASCORBIC ACID) 500 MG tablet Take 500 mg by mouth daily.     . Vitamin D, Ergocalciferol, (DRISDOL) 50000 units CAPS capsule TAKE 1 CAPSULE BY MOUTH EVERY 7 DAYS 12 capsule 0    Allergies  Allergen Reactions  . Morphine And Related Nausea Only    Social History   Tobacco Use  . Smoking status: Former Smoker    Last attempt to quit: 03/21/2006    Years since quitting: 12.0  . Smokeless tobacco: Never Used  Substance Use Topics  . Alcohol use: No    Alcohol/week: 0.0 oz    Comment: drinking in 1993    Family History  Problem Relation Age of Onset  . Hypertension Mother   . Cancer Father 107       Lymphoma  . Breast cancer Sister 4  . Colon cancer Neg Hx      Review of Systems  Musculoskeletal: Positive for joint pain.       Right knee  All other systems reviewed and are negative.   Objective:  Physical Exam  Constitutional: She is oriented to person, place, and time. She appears well-developed and well-nourished.  HENT:  Head: Normocephalic and atraumatic.  Eyes: Pupils are equal, round, and reactive to light.  Neck: Normal range of motion.  Cardiovascular: Normal rate and regular rhythm.  Respiratory: Effort normal.  GI: Soft.  Musculoskeletal:  Right knee motion is about 5-120.  There is no effusion.  Her portals are healed.  Calf is soft and nontender.   She is walking with an altered gait.  She has lateral and anterior pain to palpation.  Hip motion is full and straight leg raise is negative.  Neurological: She is alert and oriented to person, place, and time.  Skin: Skin is warm and dry.  Psychiatric: She has a normal mood and affect. Her behavior is normal. Judgment and thought content normal.    Vital signs in last 24 hours:    Labs:   Estimated body mass index is 20.2 kg/m as calculated from the following:   Height as of 03/31/18: 5\' 5"  (1.651 m).   Weight as of 03/31/18: 55.1 kg (121 lb 6.4 oz).   Imaging Review Plain radiographs demonstrate severe degenerative joint disease of the right knee(s). The overall alignment isneutral. The bone quality appears to be  good for age and reported activity level.   Preoperative templating of the joint replacement has been completed, documented, and submitted to the Operating Room personnel in order to optimize intra-operative equipment management.   Anticipated LOS equal to or greater than 2 midnights due to - Age 63 and older with one or more of the following:  - Obesity  - Expected need for hospital services (PT, OT, Nursing) required for safe  discharge  - Anticipated need for postoperative skilled nursing care or inpatient rehab  - Active co-morbidities: None  Assessment/Plan:  End stage primary arthritis, right knee   The patient history, physical examination, clinical judgment of the provider and imaging studies are consistent with end stage degenerative joint disease of the right knee(s) and total knee arthroplasty is deemed medically necessary. The treatment options including medical management, injection therapy arthroscopy and arthroplasty were discussed at length. The risks and benefits of total knee arthroplasty were presented and reviewed. The risks due to aseptic loosening, infection, stiffness, patella tracking problems, thromboembolic complications and other imponderables  were discussed. The patient acknowledged the explanation, agreed to proceed with the plan and consent was signed. Patient is being admitted for inpatient treatment for surgery, pain control, PT, OT, prophylactic antibiotics, VTE prophylaxis, progressive ambulation and ADL's and discharge planning. The patient is planning to be discharged home with home health services

## 2018-04-12 ENCOUNTER — Encounter (HOSPITAL_COMMUNITY): Payer: Self-pay

## 2018-04-12 ENCOUNTER — Encounter (HOSPITAL_COMMUNITY)
Admission: AD | Disposition: A | Discharge status: Home / Self Care | Payer: Self-pay | Source: Ambulatory Visit | Attending: Orthopaedic Surgery

## 2018-04-12 ENCOUNTER — Ambulatory Visit (HOSPITAL_COMMUNITY): Payer: 59 | Admitting: Certified Registered"

## 2018-04-12 ENCOUNTER — Inpatient Hospital Stay (HOSPITAL_COMMUNITY)
Admission: AD | Admit: 2018-04-12 | Discharge: 2018-04-15 | DRG: 470 | Disposition: A | Discharge status: Home / Self Care | Payer: 59 | Source: Ambulatory Visit | Attending: Orthopaedic Surgery | Admitting: Orthopaedic Surgery

## 2018-04-12 DIAGNOSIS — E7800 Pure hypercholesterolemia, unspecified: Secondary | ICD-10-CM | POA: Diagnosis present

## 2018-04-12 DIAGNOSIS — I7300 Raynaud's syndrome without gangrene: Secondary | ICD-10-CM | POA: Diagnosis present

## 2018-04-12 DIAGNOSIS — G8929 Other chronic pain: Secondary | ICD-10-CM | POA: Diagnosis present

## 2018-04-12 DIAGNOSIS — G8918 Other acute postprocedural pain: Secondary | ICD-10-CM | POA: Diagnosis not present

## 2018-04-12 DIAGNOSIS — R112 Nausea with vomiting, unspecified: Secondary | ICD-10-CM | POA: Diagnosis not present

## 2018-04-12 DIAGNOSIS — Z79890 Hormone replacement therapy (postmenopausal): Secondary | ICD-10-CM

## 2018-04-12 DIAGNOSIS — Z87891 Personal history of nicotine dependence: Secondary | ICD-10-CM | POA: Diagnosis not present

## 2018-04-12 DIAGNOSIS — E785 Hyperlipidemia, unspecified: Secondary | ICD-10-CM | POA: Diagnosis not present

## 2018-04-12 DIAGNOSIS — M1711 Unilateral primary osteoarthritis, right knee: Principal | ICD-10-CM | POA: Diagnosis present

## 2018-04-12 DIAGNOSIS — Z79899 Other long term (current) drug therapy: Secondary | ICD-10-CM | POA: Diagnosis not present

## 2018-04-12 HISTORY — PX: TOTAL KNEE ARTHROPLASTY: SHX125

## 2018-04-12 SURGERY — ARTHROPLASTY, KNEE, TOTAL
Anesthesia: Regional | Site: Knee | Laterality: Right

## 2018-04-12 MED ORDER — METOCLOPRAMIDE HCL 5 MG PO TABS
5.0000 mg | ORAL_TABLET | Freq: Three times a day (TID) | ORAL | Status: DC | PRN
  Administered 2018-04-14: 10 mg via ORAL
  Filled 2018-04-12: qty 2

## 2018-04-12 MED ORDER — LIDOCAINE 2% (20 MG/ML) 5 ML SYRINGE
INTRAMUSCULAR | Status: DC | PRN
  Administered 2018-04-12: 40 mg via INTRAVENOUS

## 2018-04-12 MED ORDER — LACTATED RINGERS IV SOLN
INTRAVENOUS | Status: DC
  Administered 2018-04-12 – 2018-04-13 (×2): via INTRAVENOUS

## 2018-04-12 MED ORDER — DEXAMETHASONE SODIUM PHOSPHATE 4 MG/ML IJ SOLN
INTRAMUSCULAR | Status: DC | PRN
  Administered 2018-04-12: 10 mg via INTRAVENOUS

## 2018-04-12 MED ORDER — FENTANYL CITRATE (PF) 100 MCG/2ML IJ SOLN
100.0000 ug | Freq: Once | INTRAMUSCULAR | Status: AC
  Administered 2018-04-12: 50 ug via INTRAVENOUS
  Filled 2018-04-12: qty 2

## 2018-04-12 MED ORDER — BUPIVACAINE-EPINEPHRINE (PF) 0.5% -1:200000 IJ SOLN
INTRAMUSCULAR | Status: AC
  Filled 2018-04-12: qty 30

## 2018-04-12 MED ORDER — ESTRADIOL 1 MG PO TABS
1.0000 mg | ORAL_TABLET | Freq: Every day | ORAL | Status: DC
  Administered 2018-04-13 – 2018-04-15 (×3): 1 mg via ORAL
  Filled 2018-04-12 (×3): qty 1

## 2018-04-12 MED ORDER — HYDROMORPHONE HCL 2 MG/ML IJ SOLN
0.2500 mg | INTRAMUSCULAR | Status: DC | PRN
  Administered 2018-04-12: 0.5 mg via INTRAVENOUS
  Administered 2018-04-12: 0.3 mg via INTRAVENOUS
  Administered 2018-04-12: 0.5 mg via INTRAVENOUS

## 2018-04-12 MED ORDER — ACETAMINOPHEN 325 MG PO TABS
325.0000 mg | ORAL_TABLET | Freq: Four times a day (QID) | ORAL | Status: DC | PRN
  Administered 2018-04-14: 650 mg via ORAL
  Filled 2018-04-12: qty 2

## 2018-04-12 MED ORDER — ROPIVACAINE HCL 7.5 MG/ML IJ SOLN
INTRAMUSCULAR | Status: DC | PRN
  Administered 2018-04-12 (×4): 5 mL via PERINEURAL

## 2018-04-12 MED ORDER — KETOROLAC TROMETHAMINE 15 MG/ML IJ SOLN
7.5000 mg | Freq: Four times a day (QID) | INTRAMUSCULAR | Status: AC
  Administered 2018-04-12 (×2): 7.5 mg via INTRAVENOUS
  Filled 2018-04-12 (×2): qty 1

## 2018-04-12 MED ORDER — PROPOFOL 1000 MG/100ML IV EMUL
INTRAVENOUS | Status: AC
  Filled 2018-04-12: qty 100

## 2018-04-12 MED ORDER — TRANEXAMIC ACID 1000 MG/10ML IV SOLN
1000.0000 mg | INTRAVENOUS | Status: DC
  Filled 2018-04-12: qty 10

## 2018-04-12 MED ORDER — PROPOFOL 500 MG/50ML IV EMUL
INTRAVENOUS | Status: DC | PRN
  Administered 2018-04-12: 100 ug/kg/min via INTRAVENOUS
  Administered 2018-04-12: 150 ug/kg/min via INTRAVENOUS

## 2018-04-12 MED ORDER — FENTANYL CITRATE (PF) 250 MCG/5ML IJ SOLN
INTRAMUSCULAR | Status: AC
  Filled 2018-04-12: qty 5

## 2018-04-12 MED ORDER — ALUM & MAG HYDROXIDE-SIMETH 200-200-20 MG/5ML PO SUSP
30.0000 mL | ORAL | Status: DC | PRN
  Administered 2018-04-15: 30 mL via ORAL
  Filled 2018-04-12: qty 30

## 2018-04-12 MED ORDER — OXYCODONE HCL 5 MG PO TABS
10.0000 mg | ORAL_TABLET | ORAL | Status: DC | PRN
  Administered 2018-04-12 – 2018-04-14 (×3): 10 mg via ORAL
  Filled 2018-04-12: qty 2

## 2018-04-12 MED ORDER — PROMETHAZINE HCL 25 MG/ML IJ SOLN: 6.2500 mg | INTRAMUSCULAR | Status: DC | PRN

## 2018-04-12 MED ORDER — BUPIVACAINE IN DEXTROSE 0.75-8.25 % IT SOLN
INTRATHECAL | Status: DC | PRN
  Administered 2018-04-12: 1.6 mL via INTRATHECAL

## 2018-04-12 MED ORDER — ASPIRIN EC 325 MG PO TBEC
325.0000 mg | DELAYED_RELEASE_TABLET | Freq: Two times a day (BID) | ORAL | Status: DC
  Administered 2018-04-13 – 2018-04-15 (×5): 325 mg via ORAL
  Filled 2018-04-12 (×5): qty 1

## 2018-04-12 MED ORDER — ARTIFICIAL TEARS OPHTHALMIC OINT
TOPICAL_OINTMENT | OPHTHALMIC | Status: DC | PRN
  Administered 2018-04-12: 1 via OPHTHALMIC

## 2018-04-12 MED ORDER — METHOCARBAMOL 1000 MG/10ML IJ SOLN
500.0000 mg | Freq: Four times a day (QID) | INTRAMUSCULAR | Status: DC | PRN
  Filled 2018-04-12: qty 5

## 2018-04-12 MED ORDER — ONDANSETRON HCL 4 MG/2ML IJ SOLN
4.0000 mg | Freq: Four times a day (QID) | INTRAMUSCULAR | Status: DC | PRN
  Administered 2018-04-14 – 2018-04-15 (×2): 4 mg via INTRAVENOUS
  Filled 2018-04-12 (×2): qty 2

## 2018-04-12 MED ORDER — CEFAZOLIN SODIUM-DEXTROSE 2-4 GM/100ML-% IV SOLN
INTRAVENOUS | Status: AC
  Filled 2018-04-12: qty 100

## 2018-04-12 MED ORDER — METHOCARBAMOL 500 MG PO TABS
500.0000 mg | ORAL_TABLET | Freq: Four times a day (QID) | ORAL | Status: DC | PRN
  Administered 2018-04-12 – 2018-04-15 (×7): 500 mg via ORAL
  Filled 2018-04-12 (×7): qty 1

## 2018-04-12 MED ORDER — KETOROLAC TROMETHAMINE 0.5 % OP SOLN
1.0000 [drp] | Freq: Four times a day (QID) | OPHTHALMIC | Status: DC
  Administered 2018-04-12: 1 [drp] via OPHTHALMIC
  Filled 2018-04-12: qty 3

## 2018-04-12 MED ORDER — DOCUSATE SODIUM 100 MG PO CAPS
100.0000 mg | ORAL_CAPSULE | Freq: Two times a day (BID) | ORAL | Status: DC
  Administered 2018-04-12 – 2018-04-15 (×6): 100 mg via ORAL
  Filled 2018-04-12 (×6): qty 1

## 2018-04-12 MED ORDER — POLYMYXIN B-TRIMETHOPRIM 10000-0.1 UNIT/ML-% OP SOLN
1.0000 [drp] | Freq: Three times a day (TID) | OPHTHALMIC | Status: DC
  Filled 2018-04-12: qty 10

## 2018-04-12 MED ORDER — BSS IO SOLN
15.0000 mL | Freq: Once | INTRAOCULAR | Status: AC
  Administered 2018-04-12: 15 mL
  Filled 2018-04-12: qty 15

## 2018-04-12 MED ORDER — ONDANSETRON HCL 4 MG/2ML IJ SOLN
INTRAMUSCULAR | Status: DC | PRN
  Administered 2018-04-12: 4 mg via INTRAVENOUS

## 2018-04-12 MED ORDER — LACTATED RINGERS IV SOLN
INTRAVENOUS | Status: DC | PRN
  Administered 2018-04-12: 09:00:00 via INTRAVENOUS

## 2018-04-12 MED ORDER — CEFAZOLIN SODIUM-DEXTROSE 2-4 GM/100ML-% IV SOLN
2.0000 g | Freq: Four times a day (QID) | INTRAVENOUS | Status: AC
  Administered 2018-04-12 (×2): 2 g via INTRAVENOUS
  Filled 2018-04-12: qty 100

## 2018-04-12 MED ORDER — EPHEDRINE SULFATE 50 MG/ML IJ SOLN
INTRAMUSCULAR | Status: AC
  Filled 2018-04-12: qty 1

## 2018-04-12 MED ORDER — BUPIVACAINE-EPINEPHRINE (PF) 0.5% -1:200000 IJ SOLN
INTRAMUSCULAR | Status: DC | PRN
Start: 2018-04-12 — End: 2018-04-12
  Administered 2018-04-12: 30 mL

## 2018-04-12 MED ORDER — LIDOCAINE 2% (20 MG/ML) 5 ML SYRINGE
INTRAMUSCULAR | Status: AC
  Filled 2018-04-12: qty 5

## 2018-04-12 MED ORDER — KETOROLAC TROMETHAMINE 15 MG/ML IJ SOLN
INTRAMUSCULAR | Status: AC
  Filled 2018-04-12: qty 1

## 2018-04-12 MED ORDER — METHOCARBAMOL 500 MG PO TABS
ORAL_TABLET | ORAL | Status: AC
  Administered 2018-04-12: 500 mg
  Filled 2018-04-12: qty 1

## 2018-04-12 MED ORDER — MEPERIDINE HCL 50 MG/ML IJ SOLN: 6.2500 mg | INTRAMUSCULAR | Status: DC | PRN

## 2018-04-12 MED ORDER — OXYCODONE HCL 5 MG PO TABS
5.0000 mg | ORAL_TABLET | ORAL | Status: DC | PRN
  Administered 2018-04-12 – 2018-04-15 (×11): 10 mg via ORAL
  Filled 2018-04-12 (×12): qty 2

## 2018-04-12 MED ORDER — SODIUM CHLORIDE 0.9% FLUSH
INTRAVENOUS | Status: DC | PRN
  Administered 2018-04-12: 30 mL

## 2018-04-12 MED ORDER — MIDAZOLAM HCL 5 MG/5ML IJ SOLN
INTRAMUSCULAR | Status: DC | PRN
  Administered 2018-04-12: 0.5 mg via INTRAVENOUS

## 2018-04-12 MED ORDER — DIPHENHYDRAMINE HCL 12.5 MG/5ML PO ELIX: 12.5000 mg | ORAL_SOLUTION | ORAL | Status: DC | PRN

## 2018-04-12 MED ORDER — CHLORHEXIDINE GLUCONATE 4 % EX LIQD: 60.0000 mL | Freq: Once | CUTANEOUS | Status: DC

## 2018-04-12 MED ORDER — MIDAZOLAM HCL 2 MG/2ML IJ SOLN
2.0000 mg | Freq: Once | INTRAMUSCULAR | Status: AC
  Administered 2018-04-12: 1 mg via INTRAVENOUS
  Filled 2018-04-12: qty 2

## 2018-04-12 MED ORDER — HYDROMORPHONE HCL 2 MG/ML IJ SOLN
INTRAMUSCULAR | Status: AC
  Filled 2018-04-12: qty 1

## 2018-04-12 MED ORDER — 0.9 % SODIUM CHLORIDE (POUR BTL) OPTIME
TOPICAL | Status: DC | PRN
  Administered 2018-04-12: 1000 mL

## 2018-04-12 MED ORDER — GABAPENTIN 600 MG PO TABS
1200.0000 mg | ORAL_TABLET | Freq: Two times a day (BID) | ORAL | Status: DC
  Administered 2018-04-12 – 2018-04-15 (×6): 1200 mg via ORAL
  Filled 2018-04-12 (×6): qty 2

## 2018-04-12 MED ORDER — SODIUM CHLORIDE 0.9 % IJ SOLN
INTRAMUSCULAR | Status: AC
  Filled 2018-04-12: qty 10

## 2018-04-12 MED ORDER — PHENOL 1.4 % MT LIQD: 1.0000 | OROMUCOSAL | Status: DC | PRN

## 2018-04-12 MED ORDER — PHENYLEPHRINE HCL 10 MG/ML IJ SOLN
INTRAMUSCULAR | Status: DC | PRN
  Administered 2018-04-12: 15 ug/min via INTRAVENOUS

## 2018-04-12 MED ORDER — METOCLOPRAMIDE HCL 5 MG/ML IJ SOLN: 5.0000 mg | Freq: Three times a day (TID) | INTRAMUSCULAR | Status: DC | PRN

## 2018-04-12 MED ORDER — SODIUM CHLORIDE 0.9 % IR SOLN
Status: DC | PRN
  Administered 2018-04-12: 3000 mL

## 2018-04-12 MED ORDER — OXYCODONE HCL 5 MG PO TABS
ORAL_TABLET | ORAL | Status: AC
  Filled 2018-04-12: qty 2

## 2018-04-12 MED ORDER — HYDROMORPHONE HCL 2 MG/ML IJ SOLN
0.5000 mg | INTRAMUSCULAR | Status: DC | PRN
  Administered 2018-04-12: 1 mg via INTRAVENOUS
  Administered 2018-04-13: 0.5 mg via INTRAVENOUS
  Administered 2018-04-13 (×3): 1 mg via INTRAVENOUS
  Administered 2018-04-13: 0.5 mg via INTRAVENOUS
  Administered 2018-04-14: 1 mg via INTRAVENOUS
  Filled 2018-04-12 (×7): qty 1

## 2018-04-12 MED ORDER — SIMVASTATIN 20 MG PO TABS
20.0000 mg | ORAL_TABLET | Freq: Every day | ORAL | Status: DC
  Administered 2018-04-12 – 2018-04-14 (×3): 20 mg via ORAL
  Filled 2018-04-12 (×3): qty 1

## 2018-04-12 MED ORDER — BISACODYL 5 MG PO TBEC
5.0000 mg | DELAYED_RELEASE_TABLET | Freq: Every day | ORAL | Status: DC | PRN
  Administered 2018-04-13 – 2018-04-14 (×3): 5 mg via ORAL
  Filled 2018-04-12 (×4): qty 1

## 2018-04-12 MED ORDER — LAMOTRIGINE 100 MG PO TABS
100.0000 mg | ORAL_TABLET | Freq: Two times a day (BID) | ORAL | Status: DC
  Administered 2018-04-12 – 2018-04-15 (×6): 100 mg via ORAL
  Filled 2018-04-12 (×6): qty 1

## 2018-04-12 MED ORDER — BUPIVACAINE LIPOSOME 1.3 % IJ SUSP
20.0000 mL | INTRAMUSCULAR | Status: AC
  Administered 2018-04-12: 20 mL
  Filled 2018-04-12: qty 20

## 2018-04-12 MED ORDER — ONDANSETRON HCL 4 MG/2ML IJ SOLN
INTRAMUSCULAR | Status: AC
  Filled 2018-04-12: qty 2

## 2018-04-12 MED ORDER — ONDANSETRON HCL 4 MG PO TABS
4.0000 mg | ORAL_TABLET | Freq: Four times a day (QID) | ORAL | Status: DC | PRN
  Administered 2018-04-13: 4 mg via ORAL
  Filled 2018-04-12: qty 1

## 2018-04-12 MED ORDER — ACETAMINOPHEN 500 MG PO TABS
1000.0000 mg | ORAL_TABLET | Freq: Four times a day (QID) | ORAL | Status: AC
  Administered 2018-04-12 – 2018-04-13 (×2): 1000 mg via ORAL
  Filled 2018-04-12 (×2): qty 2

## 2018-04-12 MED ORDER — TRANEXAMIC ACID 1000 MG/10ML IV SOLN
1000.0000 mg | Freq: Once | INTRAVENOUS | Status: AC
  Administered 2018-04-12: 1000 mg via INTRAVENOUS
  Filled 2018-04-12: qty 10

## 2018-04-12 MED ORDER — MIDAZOLAM HCL 2 MG/2ML IJ SOLN
INTRAMUSCULAR | Status: AC
  Filled 2018-04-12: qty 2

## 2018-04-12 MED ORDER — KETOROLAC TROMETHAMINE 30 MG/ML IJ SOLN: 30.0000 mg | Freq: Once | INTRAMUSCULAR | Status: DC | PRN

## 2018-04-12 MED ORDER — MENTHOL 3 MG MT LOZG: 1.0000 | LOZENGE | OROMUCOSAL | Status: DC | PRN

## 2018-04-12 MED ORDER — TRANEXAMIC ACID 1000 MG/10ML IV SOLN
INTRAVENOUS | Status: DC | PRN
  Administered 2018-04-12: 1000 mg via INTRAVENOUS

## 2018-04-12 MED ORDER — PROPOFOL 10 MG/ML IV BOLUS
INTRAVENOUS | Status: DC | PRN
  Administered 2018-04-12: 20 mg via INTRAVENOUS

## 2018-04-12 MED ORDER — DEXAMETHASONE SODIUM PHOSPHATE 10 MG/ML IJ SOLN
INTRAMUSCULAR | Status: AC
  Filled 2018-04-12: qty 1

## 2018-04-12 SURGICAL SUPPLY — 53 items
BAG DECANTER FOR FLEXI CONT (MISCELLANEOUS) ×1 IMPLANT
BANDAGE ACE 6X5 VEL STRL LF (GAUZE/BANDAGES/DRESSINGS) ×1 IMPLANT
BANDAGE ESMARK 6X9 LF (GAUZE/BANDAGES/DRESSINGS) ×1 IMPLANT
BLADE SAGITTAL 25.0X1.19X90 (BLADE) IMPLANT
BLADE SAW SGTL 13.0X1.19X90.0M (BLADE) IMPLANT
BNDG CMPR 9X6 STRL LF SNTH (GAUZE/BANDAGES/DRESSINGS) ×1
BNDG CMPR MED 10X6 ELC LF (GAUZE/BANDAGES/DRESSINGS) ×1
BNDG ELASTIC 6X10 VLCR STRL LF (GAUZE/BANDAGES/DRESSINGS) ×2 IMPLANT
BNDG ESMARK 6X9 LF (GAUZE/BANDAGES/DRESSINGS) ×2
BOWL SMART MIX CTS (DISPOSABLE) ×2 IMPLANT
CAPT KNEE TOTAL 3 ATTUNE ×1 IMPLANT
CEMENT HV SMART SET (Cement) ×4 IMPLANT
COVER SURGICAL LIGHT HANDLE (MISCELLANEOUS) ×2 IMPLANT
CUFF TOURNIQUET SINGLE 34IN LL (TOURNIQUET CUFF) ×2 IMPLANT
DECANTER SPIKE VIAL GLASS SM (MISCELLANEOUS) ×2 IMPLANT
DRAPE EXTREMITY T 121X128X90 (DRAPE) ×2 IMPLANT
DRAPE HALF SHEET 40X57 (DRAPES) ×4 IMPLANT
DRAPE U-SHAPE 47X51 STRL (DRAPES) ×2 IMPLANT
DRSG AQUACEL AG ADV 3.5X10 (GAUZE/BANDAGES/DRESSINGS) ×2 IMPLANT
DURAPREP 26ML APPLICATOR (WOUND CARE) ×3 IMPLANT
ELECT REM PT RETURN 9FT ADLT (ELECTROSURGICAL) ×2
ELECTRODE REM PT RTRN 9FT ADLT (ELECTROSURGICAL) ×1 IMPLANT
GLOVE BIO SURGEON STRL SZ8 (GLOVE) ×4 IMPLANT
GLOVE BIOGEL PI IND STRL 8 (GLOVE) ×2 IMPLANT
GLOVE BIOGEL PI INDICATOR 8 (GLOVE) ×2
GOWN STRL REUS W/ TWL LRG LVL3 (GOWN DISPOSABLE) ×1 IMPLANT
GOWN STRL REUS W/ TWL XL LVL3 (GOWN DISPOSABLE) ×2 IMPLANT
GOWN STRL REUS W/TWL LRG LVL3 (GOWN DISPOSABLE) ×2
GOWN STRL REUS W/TWL XL LVL3 (GOWN DISPOSABLE) ×4
HANDPIECE INTERPULSE COAX TIP (DISPOSABLE) ×2
HOOD PEEL AWAY FACE SHEILD DIS (HOOD) ×5 IMPLANT
IMMOBILIZER KNEE 22 UNIV (SOFTGOODS) ×2 IMPLANT
KIT BASIN OR (CUSTOM PROCEDURE TRAY) ×2 IMPLANT
KIT TURNOVER KIT B (KITS) ×2 IMPLANT
MANIFOLD NEPTUNE II (INSTRUMENTS) ×2 IMPLANT
NDL HYPO 21X1 ECLIPSE (NEEDLE) ×1 IMPLANT
NEEDLE 22X1 1/2 (OR ONLY) (NEEDLE) ×1 IMPLANT
NEEDLE HYPO 21X1 ECLIPSE (NEEDLE) ×2 IMPLANT
NS IRRIG 1000ML POUR BTL (IV SOLUTION) ×2 IMPLANT
PACK TOTAL JOINT (CUSTOM PROCEDURE TRAY) ×2 IMPLANT
PAD ARMBOARD 7.5X6 YLW CONV (MISCELLANEOUS) ×4 IMPLANT
SET HNDPC FAN SPRY TIP SCT (DISPOSABLE) ×1 IMPLANT
STRIP CLOSURE SKIN 1/2X4 (GAUZE/BANDAGES/DRESSINGS) ×2 IMPLANT
SUT VIC AB 0 CT1 27 (SUTURE) ×2
SUT VIC AB 0 CT1 27XBRD ANBCTR (SUTURE) ×1 IMPLANT
SUT VIC AB 2-0 CT1 27 (SUTURE) ×2
SUT VIC AB 2-0 CT1 TAPERPNT 27 (SUTURE) ×1 IMPLANT
SUT VIC AB 3-0 FS2 27 (SUTURE) ×2 IMPLANT
SUT VLOC 180 0 24IN GS25 (SUTURE) ×2 IMPLANT
SYR 50ML LL SCALE MARK (SYRINGE) ×2 IMPLANT
TOWEL OR 17X24 6PK STRL BLUE (TOWEL DISPOSABLE) ×2 IMPLANT
TOWEL OR 17X26 10 PK STRL BLUE (TOWEL DISPOSABLE) ×2 IMPLANT
TRAY CATH 16FR W/PLASTIC CATH (SET/KITS/TRAYS/PACK) ×1 IMPLANT

## 2018-04-12 NOTE — Addendum Note (Signed)
Addendum  created 04/12/18 1548 by Lyn Hollingshead, MD   Sign clinical note

## 2018-04-12 NOTE — Interval H&P Note (Signed)
History and Physical Interval Note:  04/12/2018 9:08 AM  Bethany [name]  has presented today for surgery, with the diagnosis of RIGHT KNEE DEGENERATIVE JOINT DISEASE  The various methods of treatment have been discussed with the patient and family. After consideration of risks, benefits and other options for treatment, the patient has consented to  Procedure(s): RIGHT TOTAL KNEE ARTHROPLASTY (Right) as a surgical intervention .  The patient's history has been reviewed, patient examined, no change in status, stable for surgery.  I have reviewed the patient's chart and labs.  Questions were answered to the patient's satisfaction.     Kenn Rekowski G

## 2018-04-12 NOTE — Anesthesia Procedure Notes (Signed)
Procedure Name: MAC Date/Time: 04/12/2018 10:30 AM Performed by: Orlie Dakin, CRNA Pre-anesthesia Checklist: Patient identified, Emergency Drugs available, Suction available and Patient being monitored Patient Re-evaluated:Patient Re-evaluated prior to induction Oxygen Delivery Method: Simple face mask Preoxygenation: Pre-oxygenation with 100% oxygen

## 2018-04-12 NOTE — Transfer of Care (Signed)
Immediate Anesthesia Transfer of Care Note  Patient: Bethany [name]  Procedure(s) Performed: RIGHT TOTAL KNEE ARTHROPLASTY (Right Knee)  Patient Location: PACU  Anesthesia Type:Regional and Spinal  Level of Consciousness: awake and patient cooperative  Airway & Oxygen Therapy: Patient Spontanous Breathing and Patient connected to face mask oxygen  Post-op Assessment: Report given to RN and Post -op Vital signs reviewed and stable  Post vital signs: Reviewed and stable  Last Vitals:  Vitals Value Taken Time  BP 104/47 04/12/2018 12:19 PM  Temp    Pulse 62 04/12/2018 12:20 PM  Resp 12 04/12/2018 12:20 PM  SpO2 100 % 04/12/2018 12:20 PM  Vitals shown include unvalidated device data.  Last Pain:  Vitals:   04/12/18 0830  TempSrc: Oral  PainSc: 7       Patients Stated Pain Goal: 5 (33/83/29 1916)  Complications: No apparent anesthesia complications

## 2018-04-12 NOTE — Progress Notes (Signed)
Pt arrived to room 5N17 accompanied by husband. All questions answered to satisfaction. Received report from [place], Therapist, sports. See assessment. Will continue to monitor.

## 2018-04-12 NOTE — Op Note (Signed)
PREOP DIAGNOSIS: DJD RIGHT KNEE POSTOP DIAGNOSIS: same PROCEDURE: RIGHT TKR ANESTHESIA: Spinal and MAC ATTENDING SURGEON: Tilton Marsalis [name] ASSISTANTLoni Dolly PA  INDICATIONS FOR PROCEDURE: Bethany [name] is a 62 y.o. female who has struggled for a long time with pain due to degenerative arthritis of the right knee.  The patient has failed many conservative non-operative measures and at this point has pain which limits the ability to sleep and walk.  The patient is offered total knee replacement.  Informed operative consent was obtained after discussion of possible risks of anesthesia, infection, neurovascular injury, DVT, and death.  The importance of the post-operative rehabilitation protocol to optimize result was stressed extensively with the patient.  SUMMARY OF FINDINGS AND PROCEDURE:  Bethany [name] was taken to the operative suite where under the above anesthesia a right knee replacement was performed.  There were advanced degenerative changes and the bone quality was excellent.  We used the DePuy Attune system and placed size 4 femur, 5 tibia, 38 mm all polyethylene patella, and a size 5 mm spacer.  Loni Dolly PA-C assisted throughout and was invaluable to the completion of the case in that he helped retract and maintain exposure while I placed components.  He also helped close thereby minimizing OR time.  The patient was admitted for appropriate post-op care to include perioperative antibiotics and mechanical and pharmacologic measures for DVT prophylaxis.  DESCRIPTION OF PROCEDURE:  Bethany [name] was taken to the operative suite where the above anesthesia was applied.  The patient was positioned supine and prepped and draped in normal sterile fashion.  An appropriate time out was performed.  After the administration of kefzol pre-op antibiotic the leg was elevated and exsanguinated and a tourniquet inflated. A standard longitudinal incision was made on the anterior knee.  Dissection  was carried down to the extensor mechanism.  All appropriate anti-infective measures were used including the pre-operative antibiotic, betadine impregnated drape, and closed hooded exhaust systems for each member of the surgical team.  A medial parapatellar incision was made in the extensor mechanism and the knee cap flipped and the knee flexed.  Some residual meniscal tissues were removed along with any remaining ACL/PCL tissue.  A guide was placed on the tibia and a flat cut was made on it's superior surface.  An intramedullary guide was placed in the femur and was utilized to make anterior and posterior cuts creating an appropriate flexion gap.  A second intramedullary guide was placed in the femur to make a distal cut properly balancing the knee with an extension gap equal to the flexion gap.  The three bones sized to the above mentioned sizes and the appropriate guides were placed and utilized.  A trial reduction was done and the knee easily came to full extension and the patella tracked well on flexion.  The trial components were removed and all bones were cleaned with pulsatile lavage and then dried thoroughly.  Cement was mixed and was pressurized onto the bones followed by placement of the aforementioned components.  Excess cement was trimmed and pressure was held on the components until the cement had hardened.  The tourniquet was deflated and a small amount of bleeding was controlled with cautery and pressure.  The knee was irrigated thoroughly.  The extensor mechanism was re-approximated with V-loc suture in running fashion.  The knee was flexed and the repair was solid.  The subcutaneous tissues were re-approximated with #0 and #2-0 vicryl and the skin closed with a  subcuticular stitch and steristrips.  A sterile dressing was applied.  Intraoperative fluids, EBL, and tourniquet time can be obtained from anesthesia records.  DISPOSITION:  The patient was taken to recovery room in stable condition and  admitted for appropriate post-op care to include peri-operative antibiotic and DVT prophylaxis with mechanical and pharmacologic measures.  Bethany [name] 04/12/2018, 11:46 AM

## 2018-04-12 NOTE — Anesthesia Preprocedure Evaluation (Signed)
Anesthesia Evaluation  Patient identified by MRN, date of birth, ID band Patient awake    Reviewed: Allergy & Precautions, NPO status , Patient's Chart, lab work & pertinent test results  Airway Mallampati: I  TM Distance: >3 FB Neck ROM: Full    Dental no notable dental hx. (+) Teeth Intact   Pulmonary former smoker,    Pulmonary exam normal breath sounds clear to auscultation       Cardiovascular Normal cardiovascular exam Rhythm:Regular Rate:Normal     Neuro/Psych PSYCHIATRIC DISORDERS Anxiety negative neurological ROS     GI/Hepatic negative GI ROS, Neg liver ROS,   Endo/Other  negative endocrine ROS  Renal/GU negative Renal ROS  negative genitourinary   Musculoskeletal  (+) Arthritis , Osteoarthritis,    Abdominal Normal abdominal exam  (+)   Peds negative pediatric ROS (+)  Hematology negative hematology ROS (+)   Anesthesia Other Findings   Reproductive/Obstetrics negative OB ROS                             Anesthesia Physical  Anesthesia Plan  ASA: II  Anesthesia Plan: Spinal   Post-op Pain Management:  Regional for Post-op pain   Induction: Intravenous  PONV Risk Score and Plan: 2 and Ondansetron and Dexamethasone  Airway Management Planned: Natural Airway and Simple Face Mask  Additional Equipment:   Intra-op Plan:   Post-operative Plan:   Informed Consent: I have reviewed the patients History and Physical, chart, labs and discussed the procedure including the risks, benefits and alternatives for the proposed anesthesia with the patient or authorized representative who has indicated his/her understanding and acceptance.     Plan Discussed with: CRNA and Surgeon  Anesthesia Plan Comments:         Anesthesia Quick Evaluation

## 2018-04-12 NOTE — Progress Notes (Signed)
Pt c/o discomfort in right eye, "feels dry and scratchy", sclera is reddened.  Dr Jillyn Hidden fully updated and will order med. Will continue to monitor.

## 2018-04-12 NOTE — Anesthesia Procedure Notes (Signed)
Anesthesia Regional Block: Adductor canal block   Pre-Anesthetic Checklist: ,, timeout performed, Correct Patient, Correct Site, Correct Laterality, Correct Procedure, Correct Position, site marked, Risks and benefits discussed,  Surgical consent,  Pre-op evaluation,  At surgeon's request and post-op pain management  Laterality: Right and Lower  Prep: chloraprep       Needles:  Injection technique: Single-shot  Needle Type: Echogenic Stimulator Needle     Needle Length: 10cm  Needle Gauge: 21   Needle insertion depth: 2 cm   Additional Needles:   Procedures:,,,, ultrasound used (permanent image in chart),,,,  Narrative:  Start time: 04/12/2018 9:23 AM End time: 04/12/2018 9:33 AM Injection made incrementally with aspirations every 5 mL.  Performed by: Personally  Anesthesiologist: Lyn Hollingshead, MD

## 2018-04-12 NOTE — Anesthesia Procedure Notes (Signed)
Spinal  Start time: 04/12/2018 10:16 AM End time: 04/12/2018 10:19 AM Staffing Anesthesiologist: Lyn Hollingshead, MD Performed: anesthesiologist  Preanesthetic Checklist Completed: patient identified, site marked, surgical consent, pre-op evaluation, timeout performed, IV checked, risks and benefits discussed and monitors and equipment checked Spinal Block Patient position: sitting Prep: site prepped and draped and DuraPrep Patient monitoring: continuous pulse ox and blood pressure Approach: midline Location: L3-4 Injection technique: single-shot Needle Needle type: Pencan  Needle gauge: 24 G Needle length: 10 cm Needle insertion depth: 4 cm Assessment Sensory level: T10

## 2018-04-12 NOTE — Anesthesia Postprocedure Evaluation (Addendum)
Anesthesia Post Note  Patient: Bethany [name]  Procedure(s) Performed: RIGHT TOTAL KNEE ARTHROPLASTY (Right Knee)     Patient location during evaluation: PACU Anesthesia Type: Regional Level of consciousness: sedated Pain management: pain level controlled Vital Signs Assessment: post-procedure vital signs reviewed and stable Respiratory status: spontaneous breathing Cardiovascular status: stable Postop Assessment: no headache, no backache, spinal receding, patient able to bend at knees and no apparent nausea or vomiting Anesthetic complications: yes Anesthetic complication details: injury of cornea   Last Vitals:  Vitals:   04/12/18 1320 04/12/18 1335  BP: 126/69 124/63  Pulse: 64 61  Resp: 14 10  Temp:    SpO2: 99% 94%    Last Pain:  Vitals:   04/12/18 1335  TempSrc:   PainSc: Asleep   Pain Goal: Patients Stated Pain Goal: 3 (04/12/18 1325)               Sabrin Dunlevy JR,JOHN Mateo Flow

## 2018-04-13 ENCOUNTER — Other Ambulatory Visit (HOSPITAL_COMMUNITY): Payer: Self-pay | Admitting: Respiratory Therapy

## 2018-04-13 ENCOUNTER — Other Ambulatory Visit: Payer: Self-pay

## 2018-04-13 ENCOUNTER — Encounter (HOSPITAL_COMMUNITY): Payer: Self-pay | Admitting: General Practice

## 2018-04-13 MED ORDER — ZOLPIDEM TARTRATE 5 MG PO TABS
5.0000 mg | ORAL_TABLET | Freq: Every evening | ORAL | Status: DC | PRN
  Administered 2018-04-14: 5 mg via ORAL
  Filled 2018-04-13: qty 1

## 2018-04-13 NOTE — Evaluation (Signed)
Physical Therapy Evaluation Patient Details Name: Bethany [name] MRN: [id number] DOB: 1956/04/27 Today's Date: 04/13/2018   History of Present Illness  Pt is a 62 y/o female s/p R TKA. PMH including but not limited to HLD, mood disorder and Raynaud disease.  Clinical Impression  Pt presented supine in bed with HOB elevated, awake and willing to participate in therapy session. Prior to admission, pt reported that she was independent with all functional mobility and ADLs. Pt stated that she is very active and exercises six days a week. Pt currently able to perform bed mobility with min guard, transfers with min guard and ambulate with RW and min guard. Pt would continue to benefit from skilled physical therapy services at this time while admitted and after d/c to address the below listed limitations in order to improve overall safety and independence with functional mobility.     Follow Up Recommendations Home health PT;Supervision/Assistance - 24 hour    Equipment Recommendations  None recommended by PT    Recommendations for Other Services       Precautions / Restrictions Precautions Precautions: Fall;Knee Precaution Booklet Issued: Yes (comment) Precaution Comments: PT reviewed positioning of LE following TKA sx Restrictions Weight Bearing Restrictions: Yes RLE Weight Bearing: Weight bearing as tolerated      Mobility  Bed Mobility Overal bed mobility: Needs Assistance Bed Mobility: Supine to Sit     Supine to sit: Min guard     General bed mobility comments: increased time and effort, use of bilateral UEs to assist R LE off of bed, min guard for safety  Transfers Overall transfer level: Needs assistance Equipment used: Rolling walker (2 wheeled) Transfers: Sit to/from Stand Sit to Stand: Min guard         General transfer comment: increased time and effort, cueing for safe hand placement, min guard for safety  Ambulation/Gait Ambulation/Gait assistance: Min  guard Ambulation Distance (Feet): 75 Feet Assistive device: Rolling walker (2 wheeled) Gait Pattern/deviations: Step-to pattern;Decreased step length - left;Decreased stance time - right;Decreased stride length;Decreased dorsiflexion - right;Decreased weight shift to right Gait velocity: decreased Gait velocity interpretation: <1.31 ft/sec, indicative of household ambulator General Gait Details: pt unable to achieve heelstrike on R and unable to tolerate much weight bearing through R. Cueing for sequencing with RW, min guard for safety  Stairs            Wheelchair Mobility    Modified Rankin (Stroke Patients Only)       Balance Overall balance assessment: Needs assistance Sitting-balance support: Feet supported Sitting balance-Leahy Scale: Good     Standing balance support: During functional activity;Bilateral upper extremity supported Standing balance-Leahy Scale: Poor                               Pertinent Vitals/Pain Pain Assessment: Faces Faces Pain Scale: Hurts even more Pain Location: R knee Pain Descriptors / Indicators: Sore Pain Intervention(s): Monitored during session;Repositioned    Home Living Family/patient expects to be discharged to:: Private residence Living Arrangements: Spouse/significant other Available Help at Discharge: Family;Available 24 hours/day Type of Home: House Home Access: Stairs to enter Entrance Stairs-Rails: None Entrance Stairs-Number of Steps: 1 Home Layout: One level Home Equipment: Walker - 2 wheels;Shower seat;Other (comment)(lift chair)      Prior Function Level of Independence: Independent         Comments: pt very active, exercising 6 days/week     Hand Dominance  Extremity/Trunk Assessment   Upper Extremity Assessment Upper Extremity Assessment: Overall WFL for tasks assessed    Lower Extremity Assessment Lower Extremity Assessment: RLE deficits/detail RLE Deficits / Details: pt  with decreased strength and AROM limitations secondary to post-op pain.  RLE: Unable to fully assess due to pain    Cervical / Trunk Assessment Cervical / Trunk Assessment: Normal  Communication   Communication: No difficulties  Cognition Arousal/Alertness: Awake/alert Behavior During Therapy: WFL for tasks assessed/performed Overall Cognitive Status: Within Functional Limits for tasks assessed                                        General Comments      Exercises     Assessment/Plan    PT Assessment Patient needs continued PT services  PT Problem List Decreased strength;Decreased range of motion;Decreased activity tolerance;Decreased mobility;Decreased balance;Decreased coordination;Decreased knowledge of use of DME;Decreased safety awareness;Decreased knowledge of precautions;Pain       PT Treatment Interventions DME instruction;Gait training;Stair training;Functional mobility training;Therapeutic activities;Therapeutic exercise;Balance training;Neuromuscular re-education;Patient/family education    PT Goals (Current goals can be found in the Care Plan section)  Acute Rehab PT Goals Patient Stated Goal: return home PT Goal Formulation: With patient Time For Goal Achievement: 04/27/18 Potential to Achieve Goals: Good    Frequency 7X/week   Barriers to discharge        Co-evaluation               AM-PAC PT "6 Clicks" Daily Activity  Outcome Measure Difficulty turning over in bed (including adjusting bedclothes, sheets and blankets)?: A Little Difficulty moving from lying on back to sitting on the side of the bed? : A Little Difficulty sitting down on and standing up from a chair with arms (e.g., wheelchair, bedside commode, etc,.)?: Unable Help needed moving to and from a bed to chair (including a wheelchair)?: A Little Help needed walking in hospital room?: A Little Help needed climbing 3-5 steps with a railing? : A Little 6 Click Score:  16    End of Session Equipment Utilized During Treatment: Gait belt Activity Tolerance: Patient limited by pain Patient left: in chair;with call bell/phone within reach Nurse Communication: Mobility status PT Visit Diagnosis: Other abnormalities of gait and mobility (R26.89);Pain Pain - Right/Left: Right Pain - part of body: Knee    Time: 8309-4076 PT Time Calculation (min) (ACUTE ONLY): 23 min   Charges:   PT Evaluation $PT Eval Moderate Complexity: 1 Mod PT Treatments $Therapeutic Activity: 8-22 mins   PT G Codes:        [place], PT, DPT [place] 04/13/2018, 10:05 AM

## 2018-04-13 NOTE — Progress Notes (Signed)
Subjective: 1 Day Post-Op Procedure(s) (LRB): RIGHT TOTAL KNEE ARTHROPLASTY (Right)  Activity level:  wbat Diet tolerance:  ok Voiding:  ok Patient reports pain as mild.    Objective: Vital signs in last 24 hours: Temp:  [97.2 F (36.2 C)-98.1 F (36.7 C)] 97.6 F (36.4 C) (06/12 0528) Pulse Rate:  [60-75] 65 (06/12 0528) Resp:  [10-18] 16 (06/12 0528) BP: (92-138)/(47-78) 114/72 (06/12 0528) SpO2:  [92 %-100 %] 100 % (06/12 0528) Weight:  [55.1 kg (121 lb 6.4 oz)] 55.1 kg (121 lb 6.4 oz) (06/11 0830)  Labs: No results for input(s): HGB in the last 72 hours. No results for input(s): WBC, RBC, HCT, PLT in the last 72 hours. No results for input(s): NA, K, CL, CO2, BUN, CREATININE, GLUCOSE, CALCIUM in the last 72 hours. No results for input(s): LABPT, INR in the last 72 hours.  Physical Exam:  Neurologically intact ABD soft Neurovascular intact Sensation intact distally Intact pulses distally Dorsiflexion/Plantar flexion intact Incision: dressing C/D/I and no drainage No cellulitis present Compartment soft  Assessment/Plan:  1 Day Post-Op Procedure(s) (LRB): RIGHT TOTAL KNEE ARTHROPLASTY (Right) Advance diet Up with therapy Plan for discharge tomorrow Discharge home with home health if doing well and cleared by PT. Continue on ASA 325mg  BID x 2 weeks post op. Follow up in office 2 weeks post op. Patient asked for something to help her sleep tonight so I will order Ambien. She also is asking for a laxative so she can receive some of the dulcolax that is already ordered.  Anticipated LOS equal to or greater than 2 midnights due to - Age 1 and older with one or more of the following:  - Obesity  - Expected need for hospital services (PT, OT, Nursing) required for safe  discharge  - Anticipated need for postoperative skilled nursing care or inpatient rehab  - Active co-morbidities: Chronic pain requiring opiods OR   - Unanticipated findings during/Post Surgery:  Slow post-op progression: GI, pain control, mobility  - Patient is a high risk of re-admission due to: None    Bethany [name], Larwance Sachs 04/13/2018, 7:46 AM

## 2018-04-13 NOTE — Progress Notes (Signed)
Physical Therapy Treatment Patient Details Name: Bethany [name] MRN: [id number] DOB: [birth year] Today's Date: 04/13/2018    History of Present Illness Pt is a 62 y/o female s/p R TKA. PMH including but not limited to HLD, mood disorder and Raynaud disease.    PT Comments    Pt very limited this session secondary to pain. Pt unable to participate in any further mobility or therex this session. Pt's RN notified re: pt requesting pain meds. Pt would continue to benefit from skilled physical therapy services at this time while admitted and after d/c to address the below listed limitations in order to improve overall safety and independence with functional mobility.    Follow Up Recommendations  Home health PT;Supervision/Assistance - 24 hour     Equipment Recommendations  None recommended by PT    Recommendations for Other Services       Precautions / Restrictions Precautions Precautions: Fall;Knee Precaution Booklet Issued: Yes (comment) Precaution Comments: PT reviewed positioning of LE following TKA sx Restrictions Weight Bearing Restrictions: Yes RLE Weight Bearing: Weight bearing as tolerated    Mobility  Bed Mobility Overal bed mobility: Needs Assistance Bed Mobility: Supine to Sit;Sit to Supine     Supine to sit: Supervision Sit to supine: Supervision   General bed mobility comments: increased time and effort, use of bilateral UEs to assist R LE off of bed, supervision for safety  Transfers Overall transfer level: Needs assistance Equipment used: Rolling walker (2 wheeled) Transfers: Sit to/from Stand Sit to Stand: Min guard         General transfer comment: increased time and effort, cueing for safe hand placement, min guard for safety  Ambulation/Gait Ambulation/Gait assistance: Min guard Ambulation Distance (Feet): 75 Feet Assistive device: Rolling walker (2 wheeled) Gait Pattern/deviations: Step-to pattern;Decreased step length - left;Decreased stance  time - right;Decreased stride length;Decreased dorsiflexion - right;Decreased weight shift to right Gait velocity: decreased Gait velocity interpretation: <1.31 ft/sec, indicative of household ambulator General Gait Details: pt unable to achieve heelstrike on R and unable to tolerate much weight bearing through R. Cueing for sequencing with RW, min guard for safety   Stairs             Wheelchair Mobility    Modified Rankin (Stroke Patients Only)       Balance Overall balance assessment: Needs assistance Sitting-balance support: Feet supported Sitting balance-Leahy Scale: Good     Standing balance support: During functional activity;Bilateral upper extremity supported Standing balance-Leahy Scale: Poor                              Cognition Arousal/Alertness: Awake/alert Behavior During Therapy: WFL for tasks assessed/performed Overall Cognitive Status: Within Functional Limits for tasks assessed                                        Exercises      General Comments        Pertinent Vitals/Pain Pain Assessment: Faces Faces Pain Scale: Hurts whole lot Pain Location: R knee Pain Descriptors / Indicators: Sore Pain Intervention(s): Monitored during session;Repositioned;Patient requesting pain meds-RN notified    Home Living Family/patient expects to be discharged to:: Private residence Living Arrangements: Spouse/significant other                  Prior Function  PT Goals (current goals can now be found in the care plan section) Acute Rehab PT Goals PT Goal Formulation: With patient Time For Goal Achievement: 04/27/18 Potential to Achieve Goals: Good Progress towards PT goals: Progressing toward goals    Frequency    7X/week      PT Plan Current plan remains appropriate    Co-evaluation              AM-PAC PT "6 Clicks" Daily Activity  Outcome Measure  Difficulty turning over in bed  (including adjusting bedclothes, sheets and blankets)?: A Little Difficulty moving from lying on back to sitting on the side of the bed? : A Little Difficulty sitting down on and standing up from a chair with arms (e.g., wheelchair, bedside commode, etc,.)?: Unable Help needed moving to and from a bed to chair (including a wheelchair)?: A Little Help needed walking in hospital room?: A Little Help needed climbing 3-5 steps with a railing? : A Little 6 Click Score: 16    End of Session Equipment Utilized During Treatment: Gait belt Activity Tolerance: Patient limited by pain Patient left: in bed;with call bell/phone within reach;with SCD's reapplied Nurse Communication: Mobility status PT Visit Diagnosis: Other abnormalities of gait and mobility (R26.89);Pain Pain - Right/Left: Right Pain - part of body: Knee     Time: 8299-3716 PT Time Calculation (min) (ACUTE ONLY): 18 min  Charges:  $Gait Training: 8-22 mins                    G Codes:       [place], [place], [place] 04/13/2018, 5:34 PM

## 2018-04-14 MED ORDER — PROMETHAZINE HCL 25 MG PO TABS
12.5000 mg | ORAL_TABLET | Freq: Four times a day (QID) | ORAL | Status: DC | PRN
  Administered 2018-04-14: 25 mg via ORAL
  Filled 2018-04-14: qty 1

## 2018-04-14 MED ORDER — TIZANIDINE HCL 4 MG PO TABS: 4.0000 mg | ORAL_TABLET | Freq: Four times a day (QID) | ORAL | 1 refills | Status: DC | PRN

## 2018-04-14 MED ORDER — ASPIRIN 325 MG PO TBEC: 325.0000 mg | DELAYED_RELEASE_TABLET | Freq: Two times a day (BID) | ORAL | 0 refills | Status: DC

## 2018-04-14 MED ORDER — BISACODYL 5 MG PO TBEC: 5.0000 mg | DELAYED_RELEASE_TABLET | Freq: Every day | ORAL | 0 refills | Status: AC | PRN

## 2018-04-14 MED ORDER — OXYCODONE-ACETAMINOPHEN 5-325 MG PO TABS: 1.0000 | ORAL_TABLET | ORAL | 0 refills | Status: DC | PRN

## 2018-04-14 MED ORDER — DOCUSATE SODIUM 100 MG PO CAPS: 100.0000 mg | ORAL_CAPSULE | Freq: Two times a day (BID) | ORAL | 0 refills | Status: DC

## 2018-04-14 MED ORDER — PROMETHAZINE HCL 12.5 MG PO TABS: 12.5000 mg | ORAL_TABLET | Freq: Four times a day (QID) | ORAL | 0 refills | Status: DC | PRN

## 2018-04-14 MED ORDER — PROMETHAZINE HCL 25 MG/ML IJ SOLN: 6.2500 mg | Freq: Four times a day (QID) | INTRAMUSCULAR | Status: DC | PRN

## 2018-04-14 NOTE — Progress Notes (Signed)
Physical Therapy Treatment Patient Details Name: Bethany [name] MRN: [id number] DOB: January 24, 1956 Today's Date: 04/14/2018    History of Present Illness Pt is a 62 y/o female s/p R TKA. PMH including but not limited to HLD, mood disorder and Raynaud disease.    PT Comments    Pt progressing well.  She no longer complains of nausea and able to advance gait and mobility with decreased assistance.  Reviewed and completed stair training for safe entry into her home.  Pt with improved knee flexion but still remains limited due to pain and stiffness.  Emphasized the importance of pushing ROM acutely.      Follow Up Recommendations  Home health PT;Supervision/Assistance - 24 hour     Equipment Recommendations  None recommended by PT    Recommendations for Other Services       Precautions / Restrictions Precautions Precautions: Fall;Knee Precaution Booklet Issued: Yes (comment) Precaution Comments: PT reviewed positioning of LE following TKA sx Restrictions Weight Bearing Restrictions: Yes RLE Weight Bearing: Weight bearing as tolerated    Mobility  Bed Mobility Overal bed mobility: Needs Assistance Bed Mobility: Supine to Sit     Supine to sit: Mod assist     General bed mobility comments: Pt standing in room on arrival.    Transfers Overall transfer level: Needs assistance Equipment used: Rolling walker (2 wheeled) Transfers: Sit to/from Stand Sit to Stand: Min guard         General transfer comment: Pt performed stand to sit only with cues for R knee flexion during eccentric loading.  Pt remains to return to seated position with R knee fully extended.    Ambulation/Gait Ambulation/Gait assistance: Supervision Gait Distance (Feet): 200 Feet Assistive device: Rolling walker (2 wheeled) Gait Pattern/deviations: Step-through pattern;Step-to pattern;Trunk flexed;Decreased stance time - right;Decreased stride length Gait velocity: decreased   General Gait Details:  Cues for progression to step through pattern.  She remains to require cues for R heel strike and RW safety and positioning. Much improved from this am.     Stairs Stairs: Yes Stairs assistance: Min assist Stair Management: No rails;Backwards;With walker Number of Stairs: 2 General stair comments: Pt performed curb training x2 reps.  Pt reports her 1st stair is too small to place RW onto stair so required VCs to keep RW grounded on floor and advance x 2 stairs before bringing RW up to landing.     Wheelchair Mobility    Modified Rankin (Stroke Patients Only)       Balance Overall balance assessment: Needs assistance Sitting-balance support: Feet supported Sitting balance-Leahy Scale: Fair       Standing balance-Leahy Scale: Poor                              Cognition Arousal/Alertness: Awake/alert Behavior During Therapy: WFL for tasks assessed/performed Overall Cognitive Status: Within Functional Limits for tasks assessed                                        Exercises Total Joint Exercises Ankle Circles/Pumps: AROM;Both;10 reps;Supine Quad Sets: AROM;Right;10 reps;Supine Towel Squeeze: AROM;Both;10 reps;Supine Short Arc Quad: AAROM;Right;10 reps;Supine Heel Slides: Right;10 reps;Supine;AAROM(limited ROM but improved from this am.  ) Hip ABduction/ADduction: Right;10 reps;Supine;AROM Straight Leg Raises: AAROM;Right;10 reps;Supine Long Arc Quad: AROM;Right;10 reps;Seated Goniometric ROM: 55 degrees flexion in R knee.  General Comments        Pertinent Vitals/Pain Pain Assessment: 0-10 Pain Score: 8  Pain Location: R knee Pain Descriptors / Indicators: Sore Pain Intervention(s): Monitored during session;Repositioned;Ice applied    Home Living                      Prior Function            PT Goals (current goals can now be found in the care plan section) Acute Rehab PT Goals Patient Stated Goal: return  home Potential to Achieve Goals: Good Progress towards PT goals: Progressing toward goals    Frequency    7X/week      PT Plan Current plan remains appropriate    Co-evaluation              AM-PAC PT "6 Clicks" Daily Activity  Outcome Measure  Difficulty turning over in bed (including adjusting bedclothes, sheets and blankets)?: Unable Difficulty moving from lying on back to sitting on the side of the bed? : Unable Difficulty sitting down on and standing up from a chair with arms (e.g., wheelchair, bedside commode, etc,.)?: Unable Help needed moving to and from a bed to chair (including a wheelchair)?: A Little Help needed walking in hospital room?: A Little Help needed climbing 3-5 steps with a railing? : A Little 6 Click Score: 12    End of Session Equipment Utilized During Treatment: Gait belt Activity Tolerance: Patient limited by pain Patient left: in bed;with call bell/phone within reach;with SCD's reapplied Nurse Communication: Mobility status PT Visit Diagnosis: Other abnormalities of gait and mobility (R26.89);Pain Pain - Right/Left: Right Pain - part of body: Knee     Time: 1427-1500 PT Time Calculation (min) (ACUTE ONLY): 33 min  Charges:  $Gait Training: 8-22 mins $Therapeutic Exercise: 8-22 mins                     G Codes:       Governor Rooks, PTA pager [phone]    Cristela Blue 04/14/2018, 3:19 PM

## 2018-04-14 NOTE — Progress Notes (Signed)
Physical Therapy Treatment Patient Details Name: Bethany [name] MRN: [id number] DOB: 30-Apr-1956 Today's Date: 04/14/2018    History of Present Illness Pt is a 62 y/o female s/p R TKA. PMH including but not limited to HLD, mood disorder and Raynaud disease.    PT Comments    Pt on arrival reports she still has complaints of nausea at this time.  Pt refused OOB reporting," I can't right now."  PTA encouraged exercises and patient agreeable.  She presents with decreased ROM in R knee despite efforts to assist in flexion exercises.  Pt presents with 30 degrees of R knee flexion which is rather concerning as this is hindering her functional mobility.   Pt did agree post exercises to ambulate to and from the commode.  Pt required max encouragement to sit in the recliner chair.  PTA educated on the benefits of being OOB and patient did agree to sit up through lunch.  Will f/u for progression of mobility this pm.    Follow Up Recommendations  Home health PT;Supervision/Assistance - 24 hour     Equipment Recommendations  None recommended by PT    Recommendations for Other Services       Precautions / Restrictions Precautions Precautions: Fall;Knee Precaution Booklet Issued: Yes (comment) Precaution Comments: PT reviewed positioning of LE following TKA sx Restrictions Weight Bearing Restrictions: Yes RLE Weight Bearing: Weight bearing as tolerated    Mobility  Bed Mobility Overal bed mobility: Needs Assistance Bed Mobility: Supine to Sit     Supine to sit: Mod assist     General bed mobility comments: Pt with increased pain and required moderate assistance to advance RLE off edge of bed.    Transfers Overall transfer level: Needs assistance Equipment used: Rolling walker (2 wheeled) Transfers: Sit to/from Stand Sit to Stand: Min guard         General transfer comment: increased time and effort, cueing for safe hand placement, min guard for  safety  Ambulation/Gait Ambulation/Gait assistance: Min guard Gait Distance (Feet): 10 Feet(x2 trials to and from bathroom, patient refused further distance.  ) Assistive device: Rolling walker (2 wheeled) Gait Pattern/deviations: Step-to pattern;Decreased step length - left;Decreased stance time - right;Decreased stride length;Decreased dorsiflexion - right;Decreased weight shift to right Gait velocity: decreased   General Gait Details: pt unable to achieve heelstrike on R and unable to tolerate much weight bearing through R. Cueing for sequencing with RW, min guard for safety   Stairs             Wheelchair Mobility    Modified Rankin (Stroke Patients Only)       Balance     Sitting balance-Leahy Scale: Fair       Standing balance-Leahy Scale: Poor                              Cognition Arousal/Alertness: Awake/alert Behavior During Therapy: Flat affect Overall Cognitive Status: Within Functional Limits for tasks assessed                                        Exercises Total Joint Exercises Ankle Circles/Pumps: AROM;Both;10 reps;Supine Quad Sets: AROM;Right;10 reps;Supine Towel Squeeze: AROM;Both;10 reps;Supine Short Arc Quad: AAROM;Right;10 reps;Supine Heel Slides: Right;10 reps;Supine;AAROM(limited ROM) Hip ABduction/ADduction: Right;10 reps;Supine;AROM Straight Leg Raises: AAROM;Right;10 reps;Supine    General Comments  Pertinent Vitals/Pain Pain Assessment: 0-10 Pain Score: 10-Worst pain ever    Home Living                      Prior Function            PT Goals (current goals can now be found in the care plan section) Acute Rehab PT Goals Patient Stated Goal: return home Potential to Achieve Goals: Good Progress towards PT goals: Progressing toward goals    Frequency    7X/week      PT Plan Current plan remains appropriate    Co-evaluation              AM-PAC PT "6 Clicks"  Daily Activity  Outcome Measure  Difficulty turning over in bed (including adjusting bedclothes, sheets and blankets)?: Unable Difficulty moving from lying on back to sitting on the side of the bed? : Unable Difficulty sitting down on and standing up from a chair with arms (e.g., wheelchair, bedside commode, etc,.)?: Unable Help needed moving to and from a bed to chair (including a wheelchair)?: A Little Help needed walking in hospital room?: A Little Help needed climbing 3-5 steps with a railing? : A Little 6 Click Score: 12    End of Session Equipment Utilized During Treatment: Gait belt Activity Tolerance: Patient limited by pain Patient left: in bed;with call bell/phone within reach;with SCD's reapplied Nurse Communication: Mobility status PT Visit Diagnosis: Other abnormalities of gait and mobility (R26.89);Pain Pain - Right/Left: Right Pain - part of body: Knee     Time: 1130-1202 PT Time Calculation (min) (ACUTE ONLY): 32 min  Charges:  $Gait Training: 8-22 mins $Therapeutic Activity: 8-22 mins                    G Codes:       Governor Rooks, PTA pager [phone]    Cristela Blue 04/14/2018, 12:26 PM

## 2018-04-14 NOTE — Progress Notes (Signed)
Subjective: 2 Days Post-Op Procedure(s) (LRB): RIGHT TOTAL KNEE ARTHROPLASTY (Right)   Patient has been nauseated and vomiting this morning. She is not sure why as she has taken all the medicines before. She thinks it may be to her not eating much while taking them.  Activity level:  wbat Diet tolerance:  ok Voiding:  ok Patient reports pain as mild and moderate.    Objective: Vital signs in last 24 hours: Temp:  [98.1 F (36.7 C)-98.4 F (36.9 C)] 98.4 F (36.9 C) (06/13 0510) Pulse Rate:  [73-95] 95 (06/13 0510) Resp:  [15-16] 16 (06/13 0510) BP: (113-134)/(55-73) 134/73 (06/13 0510) SpO2:  [90 %-98 %] 90 % (06/13 0510)  Labs: No results for input(s): HGB in the last 72 hours. No results for input(s): WBC, RBC, HCT, PLT in the last 72 hours. No results for input(s): NA, K, CL, CO2, BUN, CREATININE, GLUCOSE, CALCIUM in the last 72 hours. No results for input(s): LABPT, INR in the last 72 hours.  Physical Exam:  Neurologically intact ABD soft Neurovascular intact Sensation intact distally Intact pulses distally Dorsiflexion/Plantar flexion intact Incision: dressing C/D/I and no drainage No cellulitis present Compartment soft  Assessment/Plan:  2 Days Post-Op Procedure(s) (LRB): RIGHT TOTAL KNEE ARTHROPLASTY (Right) Advance diet Up with therapy Plan for discharge tomorrow Discharge home with home health if doing well and cleared by PT. I have added phenergan for nausea incase the other meds are not working for her. I have removed ace wrap today. She will need thigh high TED hose before she is discharged home.  Continue on ASA 325mg  BID x 2 weeks post op. Follow up in office 2 weeks post op Anticipated LOS equal to or greater than 2 midnights due to - Age 61 and older with one or more of the following:  - Obesity  - Expected need for hospital services (PT, OT, Nursing) required for safe  discharge  - Anticipated need for postoperative skilled nursing care or  inpatient rehab  - Active co-morbidities: None OR   - Unanticipated findings during/Post Surgery: Slow post-op progression: GI, pain control, mobility  - Patient is a high risk of re-admission due to: None  Liandro Thelin, Larwance Sachs 04/14/2018, 6:59 AM

## 2018-04-15 MED ORDER — BISACODYL 10 MG RE SUPP
10.0000 mg | Freq: Once | RECTAL | Status: AC
  Administered 2018-04-15: 10 mg via RECTAL
  Filled 2018-04-15: qty 1

## 2018-04-15 MED ORDER — FLEET ENEMA 7-19 GM/118ML RE ENEM
1.0000 | ENEMA | Freq: Every day | RECTAL | Status: DC | PRN
  Administered 2018-04-15: 1 via RECTAL
  Filled 2018-04-15: qty 1

## 2018-04-15 NOTE — Progress Notes (Signed)
Physical Therapy Treatment Patient Details Name: ROSABELLE JUPIN MRN: [id number] DOB: 1956-04-29 Today's Date: 04/15/2018    History of Present Illness Pt is a 62 y/o female s/p R TKA. PMH including but not limited to HLD, mood disorder and Raynaud disease.    PT Comments    Pt performed gait training but when back in room and sitting in recliner she reports nausea and feeling the urge to vomit.  Pt slow and guarded.  Informed nursing of nausea and complaints of abdominal pain.     Follow Up Recommendations  Home health PT;Supervision/Assistance - 24 hour     Equipment Recommendations  None recommended by PT    Recommendations for Other Services       Precautions / Restrictions Precautions Precautions: Fall;Knee Precaution Comments: PT reviewed positioning of LE following TKA sx Restrictions Weight Bearing Restrictions: Yes RLE Weight Bearing: Weight bearing as tolerated    Mobility  Bed Mobility Overal bed mobility: Needs Assistance Bed Mobility: Supine to Sit     Supine to sit: Supervision     General bed mobility comments: Pt able to move to edge of bed unassisted with increased time and effort.   Transfers Overall transfer level: Needs assistance Equipment used: Rolling walker (2 wheeled) Transfers: Sit to/from Stand Sit to Stand: Supervision         General transfer comment: Pt performed sit to stand with good hand placement and supervision for safety.  Adjusted RW from home for correct height.    Ambulation/Gait Ambulation/Gait assistance: Supervision Gait Distance (Feet): 200 Feet Assistive device: Rolling walker (2 wheeled) Gait Pattern/deviations: Step-through pattern;Step-to pattern;Trunk flexed;Decreased stance time - right;Decreased stride length Gait velocity: decreased   General Gait Details: Cues for step through continuity. Cues for sequencing and upper trunk control.  Pt is slow and guarded with movement.     Stairs   Stairs assistance:  Paediatric nurse    Modified Rankin (Stroke Patients Only)       Balance     Sitting balance-Leahy Scale: Fair       Standing balance-Leahy Scale: Fair Standing balance comment: reliance on UE support but can stand unassisted for brief period of time.                              Cognition Arousal/Alertness: Awake/alert Behavior During Therapy: WFL for tasks assessed/performed Overall Cognitive Status: Within Functional Limits for tasks assessed                                        Exercises      General Comments        Pertinent Vitals/Pain Pain Assessment: 0-10 Pain Score: 8  Pain Location: R knee and stomach pain from constipation.   Pain Descriptors / Indicators: Sore Pain Intervention(s): Monitored during session;Repositioned    Home Living                      Prior Function            PT Goals (current goals can now be found in the care plan section) Acute Rehab PT Goals Patient Stated Goal: return home Potential to Achieve Goals: Good Progress towards PT goals: Progressing toward goals    Frequency    7X/week  PT Plan Current plan remains appropriate    Co-evaluation              AM-PAC PT "6 Clicks" Daily Activity  Outcome Measure  Difficulty turning over in bed (including adjusting bedclothes, sheets and blankets)?: A Lot Difficulty moving from lying on back to sitting on the side of the bed? : A Lot Difficulty sitting down on and standing up from a chair with arms (e.g., wheelchair, bedside commode, etc,.)?: A Lot Help needed moving to and from a bed to chair (including a wheelchair)?: A Little Help needed walking in hospital room?: A Little Help needed climbing 3-5 steps with a railing? : A Little 6 Click Score: 15    End of Session   Activity Tolerance: Patient limited by pain Patient left: with call bell/phone within reach;with SCD's reapplied;in  chair;with chair alarm set Nurse Communication: Mobility status PT Visit Diagnosis: Other abnormalities of gait and mobility (R26.89);Pain Pain - Right/Left: Right Pain - part of body: Knee     Time: 0913-0929 PT Time Calculation (min) (ACUTE ONLY): 16 min  Charges:  $Gait Training: 8-22 mins                    G Codes:       Governor Rooks, PTA pager [phone]    Cristela Blue 04/15/2018, 9:39 AM

## 2018-04-15 NOTE — Progress Notes (Signed)
Discharge instructions reviewed with pt and husband.  Copy of instructions, handouts and scripts given to pt.  Pt d/c'd via wheelchair with belongings, with husband.           Escorted by hospital volunteer.

## 2018-04-15 NOTE — Progress Notes (Signed)
Patient complained of constipation. Having abdominal discomfort. Abdomen tight and distended, hypoactive bowel sounds. Patient burping but no flatus. LBM 6/11. Prn bisacodyl given along with reglan and prune juice last night. Will notify MD/PA on morning rounds for additional bowel regimen.

## 2018-04-15 NOTE — Discharge Summary (Signed)
Patient ID: Bethany [name] MRN: [id number] DOB/AGE: 03-Mar-1956 62 y.o.  Admit date: 04/12/2018 Discharge date: 04/15/2018  Admission Diagnoses:  Principal Problem:   Primary localized osteoarthritis of right knee Active Problems:   Primary osteoarthritis of right knee   Discharge Diagnoses:  Same  Past Medical History:  Diagnosis Date  . Anxiety   . Arthritis   . Cervical dysplasia 1990   CIN 2 subsequent cryosurgery. All Paps normal afterwards  . Dyslipidemia   . Herpes genitalis   . Herpes labialis   . Hypercholesteremia   . IBS (irritable bowel syndrome)   . Osteopenia 04/2014   T score -1.7 FRAX 7.3%/0.8% stable from prior DEXA 2013  . Raynaud disease     Surgeries: Procedure(s): RIGHT TOTAL KNEE ARTHROPLASTY on 04/12/2018   Consultants:   Discharged Condition: Improved  Hospital Course: Bethany [name] is an 62 y.o. female who was admitted 04/12/2018 for operative treatment ofPrimary localized osteoarthritis of right knee. Patient has severe unremitting pain that affects sleep, daily activities, and work/hobbies. After pre-op clearance the patient was taken to the operating room on 04/12/2018 and underwent  Procedure(s): RIGHT TOTAL KNEE ARTHROPLASTY.    Patient was given perioperative antibiotics:  Anti-infectives (From admission, onward)   Start     Dose/Rate Route Frequency Ordered Stop   04/12/18 1700  ceFAZolin (ANCEF) IVPB 2g/100 mL premix     2 g 200 mL/hr over 30 Minutes Intravenous Every 6 hours 04/12/18 1645 04/12/18 2345   04/12/18 1644  ceFAZolin (ANCEF) 2-4 GM/100ML-% IVPB    Note to Pharmacy:  Gwenlyn Fudge   : cabinet override      04/12/18 1644 04/13/18 0459   04/12/18 0900  ceFAZolin (ANCEF) IVPB 2g/100 mL premix     2 g 200 mL/hr over 30 Minutes Intravenous To [facility] 04/11/18 1435 04/12/18 1027       Patient was given sequential compression devices, early ambulation, and chemoprophylaxis to prevent DVT.  Patient benefited  maximally from hospital stay and there were no complications.    Recent vital signs:  Patient Vitals for the past 24 hrs:  BP Temp Temp src Pulse Resp SpO2  04/15/18 0338 (!) 149/70 98 F (36.7 C) Oral 80 16 98 %  04/14/18 1954 116/69 (!) 97.5 F (36.4 C) Oral 82 16 99 %  04/14/18 1356 (!) 102/59 98.1 F (36.7 C) Oral 72 - 100 %     Recent laboratory studies: No results for input(s): WBC, HGB, HCT, PLT, NA, K, CL, CO2, BUN, CREATININE, GLUCOSE, INR, CALCIUM in the last 72 hours.  Invalid input(s): PT, 2   Discharge Medications:   Allergies as of 04/15/2018      Reactions   Morphine And Related Nausea Only      Medication List    STOP taking these medications   ibuprofen 800 MG tablet Commonly known as:  ADVIL,MOTRIN     TAKE these medications   aspirin 325 MG EC tablet Take 1 tablet (325 mg total) by mouth 2 (two) times daily after a meal.   b complex vitamins tablet Take 1 tablet by mouth daily.   bisacodyl 5 MG EC tablet Commonly known as:  DULCOLAX Take 1 tablet (5 mg total) by mouth daily as needed for moderate constipation.   cholecalciferol 1000 units tablet Commonly known as:  VITAMIN D Take 1,000 Units by mouth daily.   docusate sodium 100 MG capsule Commonly known as:  COLACE Take 1 capsule (100 mg total) by mouth  2 (two) times daily.   estradiol 1 MG tablet Commonly known as:  ESTRACE Take 1 tablet (1 mg total) by mouth daily.   lamoTRIgine 100 MG tablet Commonly known as:  LAMICTAL Take 100 mg by mouth 2 (two) times daily.   NEURONTIN 600 MG tablet Generic drug:  gabapentin Take 1,200 mg by mouth 2 (two) times daily.   oxyCODONE-acetaminophen 5-325 MG tablet Commonly known as:  PERCOCET Take 1-2 tablets by mouth every 4 (four) hours as needed for severe pain.   PROCTOZONE-HC 2.5 % rectal cream Generic drug:  hydrocortisone apply rectally twice a day What changed:  See the new instructions.   promethazine 12.5 MG tablet Commonly known  as:  PHENERGAN Take 1-2 tablets (12.5-25 mg total) by mouth every 6 (six) hours as needed for nausea or vomiting.   simvastatin 20 MG tablet Commonly known as:  ZOCOR Take 1 tablet (20 mg total) by mouth daily.   THERATEARS 0.25 % Soln Generic drug:  Carboxymethylcellulose Sodium Place 1 drop into both eyes 2 (two) times daily as needed (dry eyes).   tiZANidine 4 MG tablet Commonly known as:  ZANAFLEX Take 1 tablet (4 mg total) by mouth every 6 (six) hours as needed.   vitamin C 500 MG tablet Commonly known as:  ASCORBIC ACID Take 500 mg by mouth daily.   Vitamin D (Ergocalciferol) 50000 units Caps capsule Commonly known as:  DRISDOL TAKE 1 CAPSULE BY MOUTH EVERY 7 DAYS            Durable Medical Equipment  (From admission, onward)        Start     Ordered   04/12/18 1723  DME Walker rolling  Once    Question:  Patient needs a walker to treat with the following condition  Answer:  Primary osteoarthritis of right knee   04/12/18 1722   04/12/18 1723  DME 3 n 1  Once     04/12/18 1722   04/12/18 1723  DME Bedside commode  Once    Question:  Patient needs a bedside commode to treat with the following condition  Answer:  Primary osteoarthritis of right knee   04/12/18 1722      Diagnostic Studies: Dg Chest 2 View  Result Date: 04/01/2018 CLINICAL DATA:  Preoperative for right total knee arthroplasty. EXAM: CHEST - 2 VIEW COMPARISON:  None. FINDINGS: Normal heart size. Normal mediastinal contour. No pneumothorax. No pleural effusion. Lungs appear clear, with no acute consolidative airspace disease and no pulmonary edema. IMPRESSION: No active cardiopulmonary disease. Electronically Signed   By: Ilona Sorrel M.D.   On: 04/01/2018 08:15    Disposition: Discharge disposition: 01-Home or Self Care       Discharge Instructions    Call MD / Call 911   Complete by:  As directed    If you experience chest pain or shortness of breath, CALL 911 and be transported to the  hospital emergency room.  If you develope a fever above 101 F, pus (white drainage) or increased drainage or redness at the wound, or calf pain, call your surgeon's office.   Constipation Prevention   Complete by:  As directed    Drink plenty of fluids.  Prune juice may be helpful.  You may use a stool softener, such as Colace (over the counter) 100 mg twice a day.  Use MiraLax (over the counter) for constipation as needed.   Diet - low sodium heart healthy   Complete by:  As  directed    Discharge instructions   Complete by:  As directed    INSTRUCTIONS AFTER JOINT REPLACEMENT   Remove items at home which could result in a fall. This includes throw rugs or furniture in walking pathways ICE to the affected joint every three hours while awake for 30 minutes at a time, for at least the first 3-5 days, and then as needed for pain and swelling.  Continue to use ice for pain and swelling. You may notice swelling that will progress down to the foot and ankle.  This is normal after surgery.  Elevate your leg when you are not up walking on it.   Continue to use the breathing machine you got in the hospital (incentive spirometer) which will help keep your temperature down.  It is common for your temperature to cycle up and down following surgery, especially at night when you are not up moving around and exerting yourself.  The breathing machine keeps your lungs expanded and your temperature down.   DIET:  As you were doing prior to hospitalization, we recommend a well-balanced diet.  DRESSING / WOUND CARE / SHOWERING  You may shower 3 days after surgery, but keep the wounds dry during showering.  You may use an occlusive plastic wrap (Press'n Seal for example), NO SOAKING/SUBMERGING IN THE BATHTUB.  If the bandage gets wet, change with a clean dry gauze.  If the incision gets wet, pat the wound dry with a clean towel.  ACTIVITY  Increase activity slowly as tolerated, but follow the weight bearing  instructions below.   No driving for 6 weeks or until further direction given by your physician.  You cannot drive while taking narcotics.  No lifting or carrying greater than 10 lbs. until further directed by your surgeon. Avoid periods of inactivity such as sitting longer than an hour when not asleep. This helps prevent blood clots.  You may return to work once you are authorized by your doctor.     WEIGHT BEARING   Weight bearing as tolerated with assist device (walker, cane, etc) as directed, use it as long as suggested by your surgeon or therapist, typically at least 4-6 weeks.   EXERCISES  Results after joint replacement surgery are often greatly improved when you follow the exercise, range of motion and muscle strengthening exercises prescribed by your doctor. Safety measures are also important to protect the joint from further injury. Any time any of these exercises cause you to have increased pain or swelling, decrease what you are doing until you are comfortable again and then slowly increase them. If you have problems or questions, call your caregiver or physical therapist for advice.   Rehabilitation is important following a joint replacement. After just a few days of immobilization, the muscles of the leg can become weakened and shrink (atrophy).  These exercises are designed to build up the tone and strength of the thigh and leg muscles and to improve motion. Often times heat used for twenty to thirty minutes before working out will loosen up your tissues and help with improving the range of motion but do not use heat for the first two weeks following surgery (sometimes heat can increase post-operative swelling).   These exercises can be done on a training (exercise) mat, on the floor, on a table or on a bed. Use whatever works the best and is most comfortable for you.    Use music or television while you are exercising so that the exercises are  a pleasant break in your day. This  will make your life better with the exercises acting as a break in your routine that you can look forward to.   Perform all exercises about fifteen times, three times per day or as directed.  You should exercise both the operative leg and the other leg as well.   Exercises include:   Quad Sets - Tighten up the muscle on the front of the thigh (Quad) and hold for 5-10 seconds.   Straight Leg Raises - With your knee straight (if you were given a brace, keep it on), lift the leg to 60 degrees, hold for 3 seconds, and slowly lower the leg.  Perform this exercise against resistance later as your leg gets stronger.  Leg Slides: Lying on your back, slowly slide your foot toward your buttocks, bending your knee up off the floor (only go as far as is comfortable). Then slowly slide your foot back down until your leg is flat on the floor again.  Angel Wings: Lying on your back spread your legs to the side as far apart as you can without causing discomfort.  Hamstring Strength:  Lying on your back, push your heel against the floor with your leg straight by tightening up the muscles of your buttocks.  Repeat, but this time bend your knee to a comfortable angle, and push your heel against the floor.  You may put a pillow under the heel to make it more comfortable if necessary.   A rehabilitation program following joint replacement surgery can speed recovery and prevent re-injury in the future due to weakened muscles. Contact your doctor or a physical therapist for more information on knee rehabilitation.    CONSTIPATION  Constipation is defined medically as fewer than three stools per week and severe constipation as less than one stool per week.  Even if you have a regular bowel pattern at home, your normal regimen is likely to be disrupted due to multiple reasons following surgery.  Combination of anesthesia, postoperative narcotics, change in appetite and fluid intake all can affect your bowels.   YOU MUST use  at least one of the following options; they are listed in order of increasing strength to get the job done.  They are all available over the counter, and you may need to use some, POSSIBLY even all of these options:    Drink plenty of fluids (prune juice may be helpful) and high fiber foods Colace 100 mg by mouth twice a day  Senokot for constipation as directed and as needed Dulcolax (bisacodyl), take with full glass of water  Miralax (polyethylene glycol) once or twice a day as needed.  If you have tried all these things and are unable to have a bowel movement in the first 3-4 days after surgery call either your surgeon or your primary doctor.    If you experience loose stools or diarrhea, hold the medications until you stool forms back up.  If your symptoms do not get better within 1 week or if they get worse, check with your doctor.  If you experience "the worst abdominal pain ever" or develop nausea or vomiting, please contact the office immediately for further recommendations for treatment.   ITCHING:  If you experience itching with your medications, try taking only a single pain pill, or even half a pain pill at a time.  You can also use Benadryl over the counter for itching or also to help with sleep.   TED HOSE  STOCKINGS:  Use stockings on both legs until for at least 2 weeks or as directed by physician office. They may be removed at night for sleeping.  MEDICATIONS:  See your medication summary on the "After Visit Summary" that nursing will review with you.  You may have some home medications which will be placed on hold until you complete the course of blood thinner medication.  It is important for you to complete the blood thinner medication as prescribed.  PRECAUTIONS:  If you experience chest pain or shortness of breath - call 911 immediately for transfer to the hospital emergency department.   If you develop a fever greater that 101 F, purulent drainage from wound, increased  redness or drainage from wound, foul odor from the wound/dressing, or calf pain - CONTACT YOUR SURGEON.                                                   FOLLOW-UP APPOINTMENTS:  If you do not already have a post-op appointment, please call the office for an appointment to be seen by your surgeon.  Guidelines for how soon to be seen are listed in your "After Visit Summary", but are typically between 1-4 weeks after surgery.  OTHER INSTRUCTIONS:   Knee Replacement:  Do not place pillow under knee, focus on keeping the knee straight while resting. CPM instructions: 0-90 degrees, 2 hours in the morning, 2 hours in the afternoon, and 2 hours in the evening. Place foam block, curve side up under heel at all times except when in CPM or when walking.  DO NOT modify, tear, cut, or change the foam block in any way.  MAKE SURE YOU:  Understand these instructions.  Get help right away if you are not doing well or get worse.    Thank you for letting us be a part of your medical care team.  It is a privilege we respect greatly.  We hope these instructions will help you stay on track for a fast and full recovery!   Increase activity slowly as tolerated   Complete by:  As directed       Follow-up Information    Melrose Nakayama, MD. Schedule an appointment as soon as possible for a visit in 2 weeks.   Specialty:  Orthopedic Surgery Contact information: [place] [address] [phone]            Signed: Rich Fuchs 04/15/2018, 7:41 AM

## 2018-04-15 NOTE — Progress Notes (Signed)
Subjective: 3 Days Post-Op Procedure(s) (LRB): RIGHT TOTAL KNEE ARTHROPLASTY (Right)   Patient feeling better other than constipation. She would like to try a suppository and if that doesn't work an enema as that has worked for her in the past. She would still like to try to go home today as long as she has a BM.  Activity level:  wbat Diet tolerance:  ok Voiding:  Urinating but no BM yet Patient reports pain as mild.    Objective: Vital signs in last 24 hours: Temp:  [97.5 F (36.4 C)-98.1 F (36.7 C)] 98 F (36.7 C) (06/14 0338) Pulse Rate:  [72-82] 80 (06/14 0338) Resp:  [16] 16 (06/14 0338) BP: (102-149)/(59-70) 149/70 (06/14 0338) SpO2:  [98 %-100 %] 98 % (06/14 0338)  Labs: No results for input(s): HGB in the last 72 hours. No results for input(s): WBC, RBC, HCT, PLT in the last 72 hours. No results for input(s): NA, K, CL, CO2, BUN, CREATININE, GLUCOSE, CALCIUM in the last 72 hours. No results for input(s): LABPT, INR in the last 72 hours.  Physical Exam:  Neurologically intact ABD soft Neurovascular intact Sensation intact distally Intact pulses distally Dorsiflexion/Plantar flexion intact Incision: dressing C/D/I and no drainage No cellulitis present Compartment soft  Assessment/Plan:  3 Days Post-Op Procedure(s) (LRB): RIGHT TOTAL KNEE ARTHROPLASTY (Right) Advance diet Up with therapy Discharge home with home health Today after PT as long as she has a bowel movement.  I have ordered a dulcolax suppository to try first and if that doesn't work a FLEET enema. Continue on ASA 325mg  BID x 2 weeks post op. Follow up in office 2 weeks post op. Anticipated LOS equal to or greater than 2 midnights due to - Age 62 and older with one or more of the following:  - Obesity  - Expected need for hospital services (PT, OT, Nursing) required for safe  discharge  - Anticipated need for postoperative skilled nursing care or inpatient rehab  - Active co-morbidities:  Chronic pain requiring opiods OR   - Unanticipated findings during/Post Surgery: Slow post-op progression: GI, pain control, mobility  - Patient is a high risk of re-admission due to: None  Imri Lor, Larwance Sachs 04/15/2018, 7:36 AM

## 2018-04-16 DIAGNOSIS — M1712 Unilateral primary osteoarthritis, left knee: Secondary | ICD-10-CM | POA: Diagnosis not present

## 2018-04-16 DIAGNOSIS — Z471 Aftercare following joint replacement surgery: Secondary | ICD-10-CM | POA: Diagnosis not present

## 2018-04-16 DIAGNOSIS — K56609 Unspecified intestinal obstruction, unspecified as to partial versus complete obstruction: Secondary | ICD-10-CM | POA: Diagnosis not present

## 2018-04-17 ENCOUNTER — Inpatient Hospital Stay (HOSPITAL_COMMUNITY): Payer: 59

## 2018-04-17 ENCOUNTER — Other Ambulatory Visit: Payer: Self-pay

## 2018-04-17 ENCOUNTER — Emergency Department (HOSPITAL_COMMUNITY): Payer: 59

## 2018-04-17 ENCOUNTER — Encounter (HOSPITAL_COMMUNITY): Payer: Self-pay | Admitting: Emergency Medicine

## 2018-04-17 ENCOUNTER — Inpatient Hospital Stay (HOSPITAL_COMMUNITY)
Admission: EM | Admit: 2018-04-17 | Discharge: 2018-05-01 | DRG: 330 | Disposition: A | Discharge status: Home Health | Payer: 59 | Attending: Internal Medicine | Admitting: Internal Medicine

## 2018-04-17 DIAGNOSIS — Z9851 Tubal ligation status: Secondary | ICD-10-CM | POA: Diagnosis not present

## 2018-04-17 DIAGNOSIS — Z79899 Other long term (current) drug therapy: Secondary | ICD-10-CM | POA: Diagnosis not present

## 2018-04-17 DIAGNOSIS — K565 Intestinal adhesions [bands], unspecified as to partial versus complete obstruction: Secondary | ICD-10-CM | POA: Diagnosis not present

## 2018-04-17 DIAGNOSIS — Z8719 Personal history of other diseases of the digestive system: Secondary | ICD-10-CM

## 2018-04-17 DIAGNOSIS — I7300 Raynaud's syndrome without gangrene: Secondary | ICD-10-CM | POA: Diagnosis present

## 2018-04-17 DIAGNOSIS — R112 Nausea with vomiting, unspecified: Secondary | ICD-10-CM | POA: Diagnosis not present

## 2018-04-17 DIAGNOSIS — Z87891 Personal history of nicotine dependence: Secondary | ICD-10-CM

## 2018-04-17 DIAGNOSIS — Z7982 Long term (current) use of aspirin: Secondary | ICD-10-CM | POA: Diagnosis not present

## 2018-04-17 DIAGNOSIS — Z96651 Presence of right artificial knee joint: Secondary | ICD-10-CM | POA: Diagnosis not present

## 2018-04-17 DIAGNOSIS — M810 Age-related osteoporosis without current pathological fracture: Secondary | ICD-10-CM | POA: Diagnosis not present

## 2018-04-17 DIAGNOSIS — Z780 Asymptomatic menopausal state: Secondary | ICD-10-CM | POA: Diagnosis not present

## 2018-04-17 DIAGNOSIS — M1711 Unilateral primary osteoarthritis, right knee: Secondary | ICD-10-CM | POA: Diagnosis not present

## 2018-04-17 DIAGNOSIS — E876 Hypokalemia: Secondary | ICD-10-CM | POA: Diagnosis present

## 2018-04-17 DIAGNOSIS — E870 Hyperosmolality and hypernatremia: Secondary | ICD-10-CM | POA: Diagnosis not present

## 2018-04-17 DIAGNOSIS — Z79890 Hormone replacement therapy (postmenopausal): Secondary | ICD-10-CM | POA: Diagnosis not present

## 2018-04-17 DIAGNOSIS — K589 Irritable bowel syndrome without diarrhea: Secondary | ICD-10-CM | POA: Diagnosis present

## 2018-04-17 DIAGNOSIS — R1110 Vomiting, unspecified: Secondary | ICD-10-CM | POA: Diagnosis not present

## 2018-04-17 DIAGNOSIS — Z90722 Acquired absence of ovaries, bilateral: Secondary | ICD-10-CM | POA: Diagnosis not present

## 2018-04-17 DIAGNOSIS — F39 Unspecified mood [affective] disorder: Secondary | ICD-10-CM | POA: Diagnosis present

## 2018-04-17 DIAGNOSIS — D126 Benign neoplasm of colon, unspecified: Secondary | ICD-10-CM | POA: Diagnosis present

## 2018-04-17 DIAGNOSIS — K5651 Intestinal adhesions [bands], with partial obstruction: Principal | ICD-10-CM | POA: Diagnosis present

## 2018-04-17 DIAGNOSIS — E7800 Pure hypercholesterolemia, unspecified: Secondary | ICD-10-CM | POA: Diagnosis present

## 2018-04-17 DIAGNOSIS — B001 Herpesviral vesicular dermatitis: Secondary | ICD-10-CM | POA: Diagnosis present

## 2018-04-17 DIAGNOSIS — Z0189 Encounter for other specified special examinations: Secondary | ICD-10-CM | POA: Diagnosis not present

## 2018-04-17 DIAGNOSIS — K56609 Unspecified intestinal obstruction, unspecified as to partial versus complete obstruction: Secondary | ICD-10-CM | POA: Diagnosis not present

## 2018-04-17 DIAGNOSIS — R0602 Shortness of breath: Secondary | ICD-10-CM | POA: Diagnosis not present

## 2018-04-17 DIAGNOSIS — K567 Ileus, unspecified: Secondary | ICD-10-CM | POA: Diagnosis not present

## 2018-04-17 DIAGNOSIS — E785 Hyperlipidemia, unspecified: Secondary | ICD-10-CM | POA: Diagnosis not present

## 2018-04-17 DIAGNOSIS — R079 Chest pain, unspecified: Secondary | ICD-10-CM | POA: Diagnosis not present

## 2018-04-17 DIAGNOSIS — N736 Female pelvic peritoneal adhesions (postinfective): Secondary | ICD-10-CM | POA: Diagnosis present

## 2018-04-17 DIAGNOSIS — Z90710 Acquired absence of both cervix and uterus: Secondary | ICD-10-CM

## 2018-04-17 DIAGNOSIS — K56699 Other intestinal obstruction unspecified as to partial versus complete obstruction: Secondary | ICD-10-CM | POA: Diagnosis not present

## 2018-04-17 DIAGNOSIS — D539 Nutritional anemia, unspecified: Secondary | ICD-10-CM | POA: Diagnosis present

## 2018-04-17 DIAGNOSIS — Z885 Allergy status to narcotic agent status: Secondary | ICD-10-CM | POA: Diagnosis not present

## 2018-04-17 DIAGNOSIS — Z4682 Encounter for fitting and adjustment of non-vascular catheter: Secondary | ICD-10-CM | POA: Diagnosis not present

## 2018-04-17 DIAGNOSIS — D72829 Elevated white blood cell count, unspecified: Secondary | ICD-10-CM

## 2018-04-17 DIAGNOSIS — R109 Unspecified abdominal pain: Secondary | ICD-10-CM | POA: Diagnosis not present

## 2018-04-17 DIAGNOSIS — K56600 Partial intestinal obstruction, unspecified as to cause: Secondary | ICD-10-CM | POA: Diagnosis not present

## 2018-04-17 DIAGNOSIS — F329 Major depressive disorder, single episode, unspecified: Secondary | ICD-10-CM | POA: Diagnosis present

## 2018-04-17 DIAGNOSIS — R1084 Generalized abdominal pain: Secondary | ICD-10-CM | POA: Diagnosis not present

## 2018-04-17 HISTORY — DX: Personal history of other diseases of the digestive system: Z87.19

## 2018-04-17 LAB — URINALYSIS, ROUTINE W REFLEX MICROSCOPIC
BILIRUBIN URINE: NEGATIVE
GLUCOSE, UA: NEGATIVE mg/dL
Hgb urine dipstick: NEGATIVE
KETONES UR: 20 mg/dL — AB
Leukocytes, UA: NEGATIVE
NITRITE: NEGATIVE
PH: 5 (ref 5.0–8.0)
Protein, ur: 30 mg/dL — AB
SPECIFIC GRAVITY, URINE: 1.024 (ref 1.005–1.030)

## 2018-04-17 LAB — CBC
HCT: 38 % (ref 36.0–46.0)
HEMOGLOBIN: 12.3 g/dL (ref 12.0–15.0)
MCH: 33.5 pg (ref 26.0–34.0)
MCHC: 32.4 g/dL (ref 30.0–36.0)
MCV: 103.5 fL — ABNORMAL HIGH (ref 78.0–100.0)
Platelets: 216 10*3/uL (ref 150–400)
RBC: 3.67 MIL/uL — AB (ref 3.87–5.11)
RDW: 12.2 % (ref 11.5–15.5)
WBC: 5.5 10*3/uL (ref 4.0–10.5)

## 2018-04-17 LAB — COMPREHENSIVE METABOLIC PANEL
ALK PHOS: 49 U/L (ref 38–126)
ALT: 14 U/L (ref 14–54)
ANION GAP: 11 (ref 5–15)
AST: 23 U/L (ref 15–41)
Albumin: 3.6 g/dL (ref 3.5–5.0)
BILIRUBIN TOTAL: 0.9 mg/dL (ref 0.3–1.2)
BUN: 10 mg/dL (ref 6–20)
CALCIUM: 9.2 mg/dL (ref 8.9–10.3)
CO2: 35 mmol/L — ABNORMAL HIGH (ref 22–32)
Chloride: 93 mmol/L — ABNORMAL LOW (ref 101–111)
Creatinine, Ser: 0.92 mg/dL (ref 0.44–1.00)
Glucose, Bld: 137 mg/dL — ABNORMAL HIGH (ref 65–99)
Potassium: 3.4 mmol/L — ABNORMAL LOW (ref 3.5–5.1)
SODIUM: 139 mmol/L (ref 135–145)
TOTAL PROTEIN: 6.8 g/dL (ref 6.5–8.1)

## 2018-04-17 LAB — LIPASE, BLOOD: Lipase: 26 U/L (ref 11–51)

## 2018-04-17 MED ORDER — DIATRIZOATE MEGLUMINE & SODIUM 66-10 % PO SOLN
90.0000 mL | Freq: Once | ORAL | Status: AC
  Administered 2018-04-17: 90 mL via NASOGASTRIC
  Filled 2018-04-17 (×2): qty 90

## 2018-04-17 MED ORDER — SODIUM CHLORIDE 0.9 % IV BOLUS
1000.0000 mL | Freq: Once | INTRAVENOUS | Status: AC
  Administered 2018-04-17: 1000 mL via INTRAVENOUS

## 2018-04-17 MED ORDER — ONDANSETRON HCL 4 MG/2ML IJ SOLN
4.0000 mg | Freq: Four times a day (QID) | INTRAMUSCULAR | Status: DC | PRN
  Administered 2018-04-17 – 2018-04-22 (×5): 4 mg via INTRAVENOUS
  Filled 2018-04-17 (×5): qty 2

## 2018-04-17 MED ORDER — IOHEXOL 300 MG/ML  SOLN
100.0000 mL | Freq: Once | INTRAMUSCULAR | Status: AC | PRN
  Administered 2018-04-17: 100 mL via INTRAVENOUS

## 2018-04-17 MED ORDER — ACETAMINOPHEN 325 MG PO TABS: 650.0000 mg | ORAL_TABLET | Freq: Four times a day (QID) | ORAL | Status: DC | PRN

## 2018-04-17 MED ORDER — ONDANSETRON HCL 4 MG PO TABS: 4.0000 mg | ORAL_TABLET | Freq: Four times a day (QID) | ORAL | Status: DC | PRN

## 2018-04-17 MED ORDER — ONDANSETRON HCL 4 MG/2ML IJ SOLN
4.0000 mg | Freq: Once | INTRAMUSCULAR | Status: AC
  Administered 2018-04-17: 4 mg via INTRAVENOUS
  Filled 2018-04-17: qty 2

## 2018-04-17 MED ORDER — ACETAMINOPHEN 650 MG RE SUPP: 650.0000 mg | Freq: Four times a day (QID) | RECTAL | Status: DC | PRN

## 2018-04-17 MED ORDER — FENTANYL CITRATE (PF) 100 MCG/2ML IJ SOLN
12.5000 ug | INTRAMUSCULAR | Status: DC | PRN
  Administered 2018-04-17 (×2): 12.5 ug via INTRAVENOUS
  Filled 2018-04-17 (×2): qty 2

## 2018-04-17 MED ORDER — KETOROLAC TROMETHAMINE 30 MG/ML IJ SOLN
30.0000 mg | Freq: Four times a day (QID) | INTRAMUSCULAR | Status: DC | PRN
  Administered 2018-04-17 – 2018-04-22 (×12): 30 mg via INTRAVENOUS
  Filled 2018-04-17 (×12): qty 1

## 2018-04-17 MED ORDER — FLEET ENEMA 7-19 GM/118ML RE ENEM: 1.0000 | ENEMA | Freq: Once | RECTAL | Status: DC | PRN

## 2018-04-17 MED ORDER — PHENOL 1.4 % MT LIQD
1.0000 | OROMUCOSAL | Status: DC | PRN
  Administered 2018-04-24: 1 via OROMUCOSAL
  Filled 2018-04-17 (×3): qty 177

## 2018-04-17 MED ORDER — FENTANYL CITRATE (PF) 100 MCG/2ML IJ SOLN
25.0000 ug | INTRAMUSCULAR | Status: DC | PRN
  Administered 2018-04-17 – 2018-04-22 (×17): 25 ug via INTRAVENOUS
  Filled 2018-04-17 (×18): qty 2

## 2018-04-17 MED ORDER — HYDROMORPHONE HCL 2 MG/ML IJ SOLN
0.5000 mg | Freq: Once | INTRAMUSCULAR | Status: AC
  Administered 2018-04-17: 0.5 mg via INTRAVENOUS
  Filled 2018-04-17: qty 1

## 2018-04-17 MED ORDER — PROMETHAZINE HCL 25 MG/ML IJ SOLN
12.5000 mg | Freq: Once | INTRAMUSCULAR | Status: AC
  Administered 2018-04-17: 12.5 mg via INTRAVENOUS
  Filled 2018-04-17: qty 1

## 2018-04-17 MED ORDER — BISACODYL 10 MG RE SUPP
10.0000 mg | Freq: Every day | RECTAL | Status: DC | PRN
  Administered 2018-04-29: 10 mg via RECTAL
  Filled 2018-04-17: qty 1

## 2018-04-17 MED ORDER — SODIUM CHLORIDE 0.9 % IV SOLN
INTRAVENOUS | Status: DC
  Administered 2018-04-17 – 2018-04-19 (×7): via INTRAVENOUS

## 2018-04-17 MED ORDER — ENOXAPARIN SODIUM 30 MG/0.3ML ~~LOC~~ SOLN
30.0000 mg | SUBCUTANEOUS | Status: DC
  Administered 2018-04-17 – 2018-04-18 (×2): 30 mg via SUBCUTANEOUS
  Filled 2018-04-17 (×2): qty 0.3

## 2018-04-17 NOTE — H&P (Addendum)
History and Physical    Bethany [name]:[id number] DOB: Apr 01, 1956 DOA: 04/17/2018   PCP: Denita Lung, MD   Patient coming from:  Home    Chief Complaint: Abdominal pain and constipation   HPI: Bethany [name] is a 62 y.o. female with medical history significant for osteoarthritis of the right knee status post right total knee replacement on 04/12/2018, IBS, history of cervical dysplasia, menopause, who presented with worsening, generalized abdominal pain.  Since her discharge, the patient did not have a proper bowel movement, only eliminating "a pellet, after an enema ", accompanied by nonbloody, bilious vomiting.  Since discharge, the patient had been taking narcotics for control of the knee pain, which she admits he may have had some involvement of his symptoms today.  She reports the abdominal pain as "knives stabbing ", left greater than right side.  The patient states that she had decreased flatus for several days.  The patient's appetite is very decreased, unable to tolerate p.o.'s.  Denies any history of prior bowel obstructions. SHe had a hysterectomy and tubal ligation in the past.  She denies any fevers, chills, chest pain or palpitations, difficulty breathing, dysuria or hematuria.  She denies any lower extremity swelling or calf pain, she has obvious right knee pain postsurgically.  Denies any history of GI cancer.  Any tobacco, alcohol or recreational drug use. .  ED Course:  BP 110/72   Pulse 83   Temp 97.9 F (36.6 C) (Oral)   Resp 13   SpO2 98%   CT of the abdomen and pelvis did demonstrate moderate to high-grade partial small bowel obstruction, no significant amount of stool is noted.  Surgery has been contacted, and is to consult this patient.   White count is normal, lipase unremarkable, UA with rare bacteria, slight amount of ketones, no nitrite CMP shows K3.4, chloride 93, bicarb 35. At the ED, the patient had 2 doses of Dilaudid with some relief of pain.  Also  had antiemetics IV  Review of Systems:  As per HPI otherwise all other systems reviewed and are negative  Past Medical History:  Diagnosis Date  . Anxiety   . Arthritis   . Cervical dysplasia 1990   CIN 2 subsequent cryosurgery. All Paps normal afterwards  . Dyslipidemia   . Herpes genitalis   . Herpes labialis   . Hypercholesteremia   . IBS (irritable bowel syndrome)   . Osteopenia 04/2014   T score -1.7 FRAX 7.3%/0.8% stable from prior DEXA 2013  . Raynaud disease     Past Surgical History:  Procedure Laterality Date  . AUGMENTATION MAMMAPLASTY    . BREAST ENHANCEMENT SURGERY  2006  . COSMETIC SURGERY    . EYE SURGERY     lasik od  . GYNECOLOGIC CRYOSURGERY  1990  . LIGAMENT REPAIR Right 09/24/2015   Procedure: RIGHT INDEX METACARPAL PHALANGEAL RADIAL COLLATERAL LIGAMENT REPAIR;  Surgeon: Leanora Cover, MD;  Location: [facility];  Service: Orthopedics;  Laterality: Right;  . OOPHORECTOMY     BSO  . Right knee arthoscopy Right 09/09/2017   [facility]  . TONSILLECTOMY AND ADENOIDECTOMY    . TOTAL KNEE ARTHROPLASTY Right 04/12/2018  . TOTAL KNEE ARTHROPLASTY Right 04/12/2018   Procedure: RIGHT TOTAL KNEE ARTHROPLASTY;  Surgeon: Melrose Nakayama, MD;  Location: [facility];  Service: Orthopedics;  Laterality: Right;  . TUBAL LIGATION    . UPPER GASTROINTESTINAL ENDOSCOPY  04/24/05  . VAGINAL HYSTERECTOMY  2005   [facility] BSO  adenomyosis    Social History Social History   Socioeconomic History  . Marital status: Married    Spouse name: Not on file  . Number of children: Not on file  . Years of education: Not on file  . Highest education level: Not on file  Occupational History  . Not on file  Social Needs  . Financial resource strain: Not on file  . Food insecurity:    Worry: Not on file    Inability: Not on file  . Transportation needs:    Medical: Not on file    Non-medical: Not on file  Tobacco Use  . Smoking status: Former Smoker    Last attempt  to quit: 03/21/2006    Years since quitting: 12.0  . Smokeless tobacco: Never Used  Substance and Sexual Activity  . Alcohol use: No    Alcohol/week: 0.0 oz    Comment: drinking in 1993  . Drug use: No  . Sexual activity: Yes    Birth control/protection: Surgical    Comment: HYST-1st intercourse 62 yo-More than 5 partners  Lifestyle  . Physical activity:    Days per week: Not on file    Minutes per session: Not on file  . Stress: Not on file  Relationships  . Social connections:    Talks on phone: Not on file    Gets together: Not on file    Attends religious service: Not on file    Active member of club or organization: Not on file    Attends meetings of clubs or organizations: Not on file    Relationship status: Not on file  . Intimate partner violence:    Fear of current or ex partner: Not on file    Emotionally abused: Not on file    Physically abused: Not on file    Forced sexual activity: Not on file  Other Topics Concern  . Not on file  Social History Narrative  . Not on file     Allergies  Allergen Reactions  . Morphine And Related Nausea Only    Family History  Problem Relation Age of Onset  . Hypertension Mother   . Cancer Father 83       Lymphoma  . Breast cancer Sister 9  . Colon cancer Neg Hx        Prior to Admission medications   Medication Sig Start Date End Date Taking? Authorizing Provider  aspirin EC 325 MG EC tablet Take 1 tablet (325 mg total) by mouth 2 (two) times daily after a meal. 04/14/18  Yes Loni Dolly, PA-C  b complex vitamins tablet Take 1 tablet by mouth daily.   Yes [provider]  bisacodyl (DULCOLAX) 5 MG EC tablet Take 1 tablet (5 mg total) by mouth daily as needed for moderate constipation. 04/14/18  Yes Loni Dolly, PA-C  Carboxymethylcellulose Sodium (THERATEARS) 0.25 % SOLN Place 1 drop into both eyes 2 (two) times daily as needed (dry eyes).   Yes [provider]  cholecalciferol (VITAMIN D) 1000  UNITS tablet Take 1,000 Units by mouth daily.   Yes [provider]  docusate sodium (COLACE) 100 MG capsule Take 1 capsule (100 mg total) by mouth 2 (two) times daily. 04/14/18  Yes Loni Dolly, PA-C  estradiol (ESTRACE) 1 MG tablet Take 1 tablet (1 mg total) by mouth daily. 01/03/18  Yes Denita Lung, MD  gabapentin (NEURONTIN) 600 MG tablet Take 1,200 mg by mouth 2 (two) times daily.  Yes [provider]  lamoTRIgine (LAMICTAL) 100 MG tablet Take 100 mg by mouth 2 (two) times daily.  05/19/15  Yes [provider]  oxyCODONE-acetaminophen (PERCOCET) 5-325 MG tablet Take 1-2 tablets by mouth every 4 (four) hours as needed for severe pain. 04/14/18 04/14/19 Yes Loni Dolly, PA-C  PROCTOZONE-HC 2.5 % rectal cream apply rectally twice a day Patient taking differently: Apply rectally twice a day as needed for irritations 06/28/17  Yes Denita Lung, MD  promethazine (PHENERGAN) 12.5 MG tablet Take 1-2 tablets (12.5-25 mg total) by mouth every 6 (six) hours as needed for nausea or vomiting. 04/14/18  Yes Loni Dolly, PA-C  simvastatin (ZOCOR) 20 MG tablet Take 1 tablet (20 mg total) by mouth daily. 01/04/18  Yes Denita Lung, MD  tiZANidine (ZANAFLEX) 4 MG tablet Take 1 tablet (4 mg total) by mouth every 6 (six) hours as needed. 04/14/18 04/14/19 Yes Loni Dolly, PA-C  vitamin C (ASCORBIC ACID) 500 MG tablet Take 500 mg by mouth daily.   Yes [provider]  Vitamin D, Ergocalciferol, (DRISDOL) 50000 units CAPS capsule TAKE 1 CAPSULE BY MOUTH EVERY 7 DAYS 02/25/18  Yes Lyndal Pulley, DO     Physical Exam:  Vitals:   04/17/18 0335 04/17/18 0730  BP: 133/81 110/72  Pulse: (!) 103 83  Resp: 18 13  Temp: 97.9 F (36.6 C)   TempSrc: Oral   SpO2: 98% 98%   Constitutional: NAD, calm, but ill appearing due to nausea and pain Eyes: PERRL, lids and conjunctivae normal ENMT: Mucous membranes are moist, without exudate or lesions  Neck: normal, supple, no masses,  no thyromegaly Respiratory: clear to auscultation bilaterally, no wheezing, no crackles. Normal respiratory effort  Cardiovascular: Regular rate and rhythm,  murmur, rubs or gallops. No extremity edema. 2+ pedal pulses. No carotid bruits.  Abdomen: Distended, diffuse tenderness throughout, left greater than right, no discrete masses palpable.  All sounds are present.   Musculoskeletal: no clubbing / cyanosis. Moves all extremities Skin: no jaundice, No lesions.  Right knee with surgical incision site along the anterior aspect, no warmth, erythema or drainage.  Bandages in place. Neurologic: Sensation intact  Strength equal in all extremities Psychiatric:   Alert and oriented x 3. Normal mood.     Labs on Admission: I have personally reviewed following labs and imaging studies  CBC: Recent Labs  Lab 04/17/18 0353  WBC 5.5  HGB 12.3  HCT 38.0  MCV 103.5*  PLT 732    Basic Metabolic Panel: Recent Labs  Lab 04/17/18 0353  NA 139  K 3.4*  CL 93*  CO2 35*  GLUCOSE 137*  BUN 10  CREATININE 0.92  CALCIUM 9.2    GFR: Estimated Creatinine Clearance: 55.1 mL/min (by C-G formula based on SCr of 0.92 mg/dL).  Liver Function Tests: Recent Labs  Lab 04/17/18 0353  AST 23  ALT 14  ALKPHOS 49  BILITOT 0.9  PROT 6.8  ALBUMIN 3.6   Recent Labs  Lab 04/17/18 0353  LIPASE 26   No results for input(s): AMMONIA in the last 168 hours.  Coagulation Profile: No results for input(s): INR, PROTIME in the last 168 hours.  Cardiac Enzymes: No results for input(s): CKTOTAL, CKMB, CKMBINDEX, TROPONINI in the last 168 hours.  BNP (last 3 results) No results for input(s): PROBNP in the last 8760 hours.  HbA1C: No results for input(s): HGBA1C in the last 72 hours.  CBG: No results for input(s): GLUCAP in the last 168  hours.  Lipid Profile: No results for input(s): CHOL, HDL, LDLCALC, TRIG, CHOLHDL, LDLDIRECT in the last 72 hours.  Thyroid Function Tests: No results for  input(s): TSH, T4TOTAL, FREET4, T3FREE, THYROIDAB in the last 72 hours.  Anemia Panel: No results for input(s): VITAMINB12, FOLATE, FERRITIN, TIBC, IRON, RETICCTPCT in the last 72 hours.  Urine analysis:    Component Value Date/Time   COLORURINE YELLOW 04/17/2018 0350   APPEARANCEUR HAZY (A) 04/17/2018 0350   LABSPEC 1.024 04/17/2018 0350   LABSPEC 1.030 08/04/2017 0920   PHURINE 5.0 04/17/2018 0350   GLUCOSEU NEGATIVE 04/17/2018 0350   HGBUR NEGATIVE 04/17/2018 0350   BILIRUBINUR NEGATIVE 04/17/2018 0350   BILIRUBINUR negative 08/04/2017 0920   BILIRUBINUR n 09/16/2016 1559   KETONESUR 20 (A) 04/17/2018 0350   PROTEINUR 30 (A) 04/17/2018 0350   UROBILINOGEN 0.2 09/16/2016 1559   UROBILINOGEN 2 (H) 05/23/2015 1252   NITRITE NEGATIVE 04/17/2018 0350   LEUKOCYTESUR NEGATIVE 04/17/2018 0350    Sepsis Labs: @LABRCNTIP (procalcitonin:4,lacticidven:4) )No results found for this or any previous visit (from the past 240 hour(s)).   Radiological Exams on Admission: Ct Abdomen Pelvis W Contrast  Result Date: 04/17/2018 CLINICAL DATA:  Constipation, abdominal pain, vomiting EXAM: CT ABDOMEN AND PELVIS WITH CONTRAST TECHNIQUE: Multidetector CT imaging of the abdomen and pelvis was performed using the standard protocol following bolus administration of intravenous contrast. CONTRAST:  134mL OMNIPAQUE IOHEXOL 300 MG/ML  SOLN COMPARISON:  None. FINDINGS: Lower chest: Bilateral breast implants partially imaged. Linear atelectasis or scarring at the right base. No effusions. Hepatobiliary: No focal hepatic abnormality. Gallbladder unremarkable. Pancreas: No focal abnormality or ductal dilatation. Spleen: No focal abnormality.  Normal size. Adrenals/Urinary Tract: No adrenal abnormality. No focal renal abnormality. No stones or hydronephrosis. Urinary bladder is unremarkable. Stomach/Bowel: Dilated fluid-filled small bowel loops with air-fluid levels noted throughout the abdomen and pelvis. Distal  small bowel loops appear decompressed. Exact transition is not visualize, but findings compatible with moderate to high-grade partial small bowel obstruction. Large bowel decompressed, grossly unremarkable. Vascular/Lymphatic: No evidence of aneurysm or adenopathy. Reproductive: Prior hysterectomy.  No adnexal masses. Other: Small amount of free fluid in the pelvis.  No free air. Musculoskeletal: No acute bony abnormality. L5 related grade 1 anterolisthesis of to facet disease L4 on. Degenerative disc disease at L4-5. IMPRESSION: Findings compatible with moderate to high-grade partial small bowel obstruction. Exact transition and cause not visualized, presumably adhesions. Prior hysterectomy. Electronically Signed   By: Rolm Baptise M.D.   On: 04/17/2018 09:11    EKG: Independently reviewed.  Assessment/Plan Principal Problem:   SBO (small bowel obstruction) (HCC) Active Problems:   Raynaud's disease   Recurrent herpes labialis   Mood disorder (HCC)   Tubular adenoma of colon   Primary osteoarthritis of right knee   IBS (irritable bowel syndrome)  Partial SBO, likely due to increased narcotic use due to recent surgery.  Times included severe abdominal pain, with nausea and vomiting.  She had constipation since the surgery, worse since discharge.  She does have flatus  No prior history of same.  Vital signs are stable,  afebrile, white count is normal.CT of the abdomen and pelvis did demonstrate moderate to high-grade partial small bowel obstruction, no significant amount of stool is noted.  She is to have an NG tube placed. Admit to MedSurg inpatient NPO for now IV fluids with normal saline at 125 cc an hour Continue PRN antiemetics Limited IV narcotics for pain with fentanyl 12.5  g every 4 hours prn  Encourage mobilization Abdominal x-ray in the morning CBC and BMP in the morning General surgery to evaluate, appreciate their involvement Agree with NG tube, other plans as per  surgery.  Status post right TKA on April 12, 2018, in a patient with right knee arthritis.  Patient is under the care of Dr. Tenna Child spoke with  Physician Assistant Loni Dolly to inform  of the patient's admission, and of plans for anticoagulation with low dose Lovenox  while here for DVT prophylaxis, agreeing with the plan.  He recommends that the patient be discharged on ASA 325 mg bid oral . Ortho to pay social visit on 6/17 in am. Appreciate their involvement  Continue pain medication regimen, minimizing narcotic use PT and OT   Hyperlipidemia Hold  home statins while NPO    Menopause Hold her Estrogen replacement meds    DVT prophylaxis: Lovenox 30 mg every 24 hours Code Status:    Full Family Communication:  Discussed with patient Disposition Plan: Expect patient to be discharged to home after condition improves Consults called:    General surgery as per EDP Admission status: MedSurg inpatient  Sharene Butters, PA-C [facility]   Amion text  [phone]   04/17/2018, 9:42 AM

## 2018-04-17 NOTE — ED Provider Notes (Signed)
[facility] EMERGENCY DEPARTMENT Provider Note   CSN: [id number] Arrival date & time: 04/17/18  0329     History   Chief Complaint Chief Complaint  Patient presents with  . Constipation  . Abdominal Pain    HPI Bethany [name] is a 62 y.o. female past medical history of arthritis, cervical dysplasia, IBS who is status post total knee replacement on 04/12/2018 who presents for evaluation of persistent constipation and intermittent, progressively worsening generalized abdominal pain.  Patient reports that she did not have a bowel movement immediately after her surgery on the 11th.  Patient reports that she was given an enema and discharged home.  Patient reports that since then, she is continued to have constipation and states that she has had no bowel movement.  Patient reports that since then, she has experienced some generalized abdominal pain that is progressively worsened.  She states that there are no alleviating or aggravating factors.  She states that the abdominal pain feels like "knives stabbing."  She states that the pain will eventually resolved by itself without any intervention but then will return.  Additionally yesterday she started developing some nausea/vomiting.  She reports multiple episodes of nonbloody emesis since yesterday.  She does report emesis was slightly green in color.  Patient states that she has not been passing any flatus but does not know for how long.  She denies any history of abdominal surgeries or bowel obstructions.  Patient states she has had some mild pain to the right knee secondary to surgery.  She has been taking hydrocodone for pain relief.  Patient denies any fevers, chest pain, difficulty breathing, dysuria, hematuria.  The history is provided by the patient.    Past Medical History:  Diagnosis Date  . Anxiety   . Arthritis   . Cervical dysplasia 1990   CIN 2 subsequent cryosurgery. All Paps normal afterwards  . Dyslipidemia    . Herpes genitalis   . Herpes labialis   . Hypercholesteremia   . IBS (irritable bowel syndrome)   . Osteopenia 04/2014   T score -1.7 FRAX 7.3%/0.8% stable from prior DEXA 2013  . Raynaud disease     Patient Active Problem List   Diagnosis Date Noted  . Hypercholesteremia 04/17/2018  . IBS (irritable bowel syndrome) 04/17/2018  . SBO (small bowel obstruction) ([place]) 04/17/2018  . Primary localized osteoarthritis of right knee 04/12/2018  . Primary osteoarthritis of right knee 04/12/2018  . Degenerative joint disease of knee, left 11/18/2017  . Nonallopathic lesion of thoracic region 08/05/2017  . Nonallopathic lesion of lumbosacral region 08/05/2017  . Nonallopathic lesion of sacral region 08/05/2017  . Acute lateral meniscus tear of right knee 01/06/2017  . Hemorrhoid 04/21/2016  . Tubular adenoma of colon 04/21/2016  . Chronic back pain 09/07/2013  . Mood disorder ([place])   . Hyperlipidemia with target LDL less than 100 06/17/2011  . Raynaud's disease 06/17/2011  . Recurrent herpes labialis 06/17/2011    Past Surgical History:  Procedure Laterality Date  . AUGMENTATION MAMMAPLASTY    . BREAST ENHANCEMENT SURGERY  2006  . COSMETIC SURGERY    . EYE SURGERY     lasik od  . GYNECOLOGIC CRYOSURGERY  1990  . LIGAMENT REPAIR Right 09/24/2015   Procedure: RIGHT INDEX METACARPAL PHALANGEAL RADIAL COLLATERAL LIGAMENT REPAIR;  Surgeon: Leanora Cover, MD;  Location: [facility];  Service: Orthopedics;  Laterality: Right;  . OOPHORECTOMY     BSO  . Right knee arthoscopy Right  09/09/2017   [facility]  . TONSILLECTOMY AND ADENOIDECTOMY    . TOTAL KNEE ARTHROPLASTY Right 04/12/2018  . TOTAL KNEE ARTHROPLASTY Right 04/12/2018   Procedure: RIGHT TOTAL KNEE ARTHROPLASTY;  Surgeon: Melrose Nakayama, MD;  Location: [facility];  Service: Orthopedics;  Laterality: Right;  . TUBAL LIGATION    . UPPER GASTROINTESTINAL ENDOSCOPY  04/24/05  . VAGINAL HYSTERECTOMY  2005   TVH BSO   adenomyosis     OB History    Gravida  5   Para  2   Term  2   Preterm      AB  3   Living  2     SAB  1   TAB      Ectopic      Multiple      Live Births               Home Medications    Prior to Admission medications   Medication Sig Start Date End Date Taking? Authorizing Provider  aspirin EC 325 MG EC tablet Take 1 tablet (325 mg total) by mouth 2 (two) times daily after a meal. 04/14/18  Yes Loni Dolly, PA-C  b complex vitamins tablet Take 1 tablet by mouth daily.   Yes [provider]  bisacodyl (DULCOLAX) 5 MG EC tablet Take 1 tablet (5 mg total) by mouth daily as needed for moderate constipation. 04/14/18  Yes Loni Dolly, PA-C  Carboxymethylcellulose Sodium (THERATEARS) 0.25 % SOLN Place 1 drop into both eyes 2 (two) times daily as needed (dry eyes).   Yes [provider]  cholecalciferol (VITAMIN D) 1000 UNITS tablet Take 1,000 Units by mouth daily.   Yes [provider]  docusate sodium (COLACE) 100 MG capsule Take 1 capsule (100 mg total) by mouth 2 (two) times daily. 04/14/18  Yes Loni Dolly, PA-C  estradiol (ESTRACE) 1 MG tablet Take 1 tablet (1 mg total) by mouth daily. 01/03/18  Yes Denita Lung, MD  gabapentin (NEURONTIN) 600 MG tablet Take 1,200 mg by mouth 2 (two) times daily.    Yes [provider]  lamoTRIgine (LAMICTAL) 100 MG tablet Take 100 mg by mouth 2 (two) times daily.  05/19/15  Yes [provider]  oxyCODONE-acetaminophen (PERCOCET) 5-325 MG tablet Take 1-2 tablets by mouth every 4 (four) hours as needed for severe pain. 04/14/18 04/14/19 Yes Loni Dolly, PA-C  PROCTOZONE-HC 2.5 % rectal cream apply rectally twice a day Patient taking differently: Apply rectally twice a day as needed for irritations 06/28/17  Yes Denita Lung, MD  promethazine (PHENERGAN) 12.5 MG tablet Take 1-2 tablets (12.5-25 mg total) by mouth every 6 (six) hours as needed for nausea or vomiting. 04/14/18  Yes Loni Dolly, PA-C  simvastatin (ZOCOR) 20 MG tablet Take 1 tablet (20 mg total) by mouth daily. 01/04/18  Yes Denita Lung, MD  tiZANidine (ZANAFLEX) 4 MG tablet Take 1 tablet (4 mg total) by mouth every 6 (six) hours as needed. 04/14/18 04/14/19 Yes Loni Dolly, PA-C  vitamin C (ASCORBIC ACID) 500 MG tablet Take 500 mg by mouth daily.   Yes [provider]  Vitamin D, Ergocalciferol, (DRISDOL) 50000 units CAPS capsule TAKE 1 CAPSULE BY MOUTH EVERY 7 DAYS 02/25/18  Yes Lyndal Pulley, DO    Family History Family History  Problem Relation Age of Onset  . Hypertension Mother   . Cancer Father 56       Lymphoma  . Breast cancer Sister 15  .  Colon cancer Neg Hx     Social History Social History   Tobacco Use  . Smoking status: Former Smoker    Last attempt to quit: 03/21/2006    Years since quitting: 12.0  . Smokeless tobacco: Never Used  Substance Use Topics  . Alcohol use: No    Alcohol/week: 0.0 oz    Comment: drinking in 1993  . Drug use: No     Allergies   Morphine and related   Review of Systems Review of Systems  Constitutional: Negative for fever.  Respiratory: Negative for cough and shortness of breath.   Cardiovascular: Negative for chest pain.  Gastrointestinal: Positive for abdominal pain, constipation, nausea and vomiting.  Genitourinary: Negative for dysuria and hematuria.  Neurological: Negative for headaches.  All other systems reviewed and are negative.    Physical Exam Updated Vital Signs BP 110/72   Pulse 83   Temp 97.9 F (36.6 C) (Oral)   Resp 13   SpO2 98%   Physical Exam  Constitutional: She is oriented to person, place, and time. She appears well-developed and well-nourished.  Appears uncomfortable but no acute distress   HENT:  Head: Normocephalic and atraumatic.  Mouth/Throat: Oropharynx is clear and moist and mucous membranes are normal.  Eyes: Pupils are equal, round, and reactive to light. Conjunctivae, EOM and lids are  normal.  Neck: Full passive range of motion without pain.  Cardiovascular: Normal rate, regular rhythm, normal heart sounds and normal pulses. Exam reveals no gallop and no friction rub.  No murmur heard. Pulmonary/Chest: Effort normal and breath sounds normal.  Lungs clear to auscultation bilaterally.  Symmetric chest rise.  No wheezing, rales, rhonchi.  Abdominal: Soft. Normal appearance. Bowel sounds are decreased. There is generalized tenderness. There is no rigidity and no guarding.  Abdomen is soft, non-distended.  Diffuse tenderness noted throughout with no focal point.  Musculoskeletal: Normal range of motion.  Right knee with surgical incision site along the anterior aspect.  No surrounding warmth, erythema, drainage.  Bandage in place.  Neurological: She is alert and oriented to person, place, and time.  Skin: Skin is warm and dry. Capillary refill takes less than 2 seconds.  Psychiatric: She has a normal mood and affect. Her speech is normal.  Nursing note and vitals reviewed.    ED Treatments / Results  Labs (all labs ordered are listed, but only abnormal results are displayed) Labs Reviewed  COMPREHENSIVE METABOLIC PANEL - Abnormal; Notable for the following components:      Result Value   Potassium 3.4 (*)    Chloride 93 (*)    CO2 35 (*)    Glucose, Bld 137 (*)    All other components within normal limits  CBC - Abnormal; Notable for the following components:   RBC 3.67 (*)    MCV 103.5 (*)    All other components within normal limits  URINALYSIS, ROUTINE W REFLEX MICROSCOPIC - Abnormal; Notable for the following components:   APPearance HAZY (*)    Ketones, ur 20 (*)    Protein, ur 30 (*)    Bacteria, UA RARE (*)    All other components within normal limits  LIPASE, BLOOD    EKG None  Radiology Ct Abdomen Pelvis W Contrast  Result Date: 04/17/2018 CLINICAL DATA:  Constipation, abdominal pain, vomiting EXAM: CT ABDOMEN AND PELVIS WITH CONTRAST TECHNIQUE:  Multidetector CT imaging of the abdomen and pelvis was performed using the standard protocol following bolus administration of intravenous contrast. CONTRAST:  179mL OMNIPAQUE IOHEXOL 300 MG/ML  SOLN COMPARISON:  None. FINDINGS: Lower chest: Bilateral breast implants partially imaged. Linear atelectasis or scarring at the right base. No effusions. Hepatobiliary: No focal hepatic abnormality. Gallbladder unremarkable. Pancreas: No focal abnormality or ductal dilatation. Spleen: No focal abnormality.  Normal size. Adrenals/Urinary Tract: No adrenal abnormality. No focal renal abnormality. No stones or hydronephrosis. Urinary bladder is unremarkable. Stomach/Bowel: Dilated fluid-filled small bowel loops with air-fluid levels noted throughout the abdomen and pelvis. Distal small bowel loops appear decompressed. Exact transition is not visualize, but findings compatible with moderate to high-grade partial small bowel obstruction. Large bowel decompressed, grossly unremarkable. Vascular/Lymphatic: No evidence of aneurysm or adenopathy. Reproductive: Prior hysterectomy.  No adnexal masses. Other: Small amount of free fluid in the pelvis.  No free air. Musculoskeletal: No acute bony abnormality. L5 related grade 1 anterolisthesis of to facet disease L4 on. Degenerative disc disease at L4-5. IMPRESSION: Findings compatible with moderate to high-grade partial small bowel obstruction. Exact transition and cause not visualized, presumably adhesions. Prior hysterectomy. Electronically Signed   By: Rolm Baptise M.D.   On: 04/17/2018 09:11    Procedures Procedures (including critical care time)  Medications Ordered in ED Medications  sodium chloride 0.9 % bolus 1,000 mL (1,000 mLs Intravenous New Bag/Given 04/17/18 0936)  ondansetron (ZOFRAN) injection 4 mg (4 mg Intravenous Given 04/17/18 0759)  HYDROmorphone (DILAUDID) injection 0.5 mg (0.5 mg Intravenous Given 04/17/18 0759)  sodium chloride 0.9 % bolus 1,000 mL (0 mLs  Intravenous Stopped 04/17/18 0900)  iohexol (OMNIPAQUE) 300 MG/ML solution 100 mL (100 mLs Intravenous Contrast Given 04/17/18 0841)  HYDROmorphone (DILAUDID) injection 0.5 mg (0.5 mg Intravenous Given 04/17/18 0936)     Initial Impression / Assessment and Plan / ED Course  I have reviewed the triage vital signs and the nursing notes.  Pertinent labs & imaging results that were available during my care of the patient were reviewed by me and considered in my medical decision making (see chart for details).     62 year old female who is status post total knee replacement on 04/12/2018 presents for evaluation of constipation and abdominal pain for the last 5 days.  She is not passing flatus but does not know how long this been going on.  Additionally started developing vomiting yesterday.  No fevers, chest pain, difficulty breathing.  Is taking narcotics for pain relief. Patient is afebrile, non-toxic appearing, sitting comfortably on examination table. Vital signs reviewed and stable.  On exam, patient has diffuse tenderness noted with no focal point.  Abdomen is not distended.  Given that patient is taking narcotic pain medication, constipation could likely be due to narcotic induced but given duration and lack of flatus, concern for small bowel obstruction versus ileus.  Initial labs ordered at triage.  Will plan for CT abdomen pelvis for further evaluation.  Analgesics, fluids, antiemetics provided in the ED.  CMP shows potassium of 3.4, chloride of 93, bicarb of 35.  Otherwise unremarkable.  CBC without any significant leukocytosis, anemia.  Lipase is unremarkable.  UA shows rare bacteria, slight amount of ketones.  Otherwise unremarkable.  CT abdomen pelvis shows moderate to high-grade partial small bowel obstruction. Plan to consult gen surg for further recommendation.   Discussed patient with Janett Billow (Gen Surg PA).  Agrees with plan for NG tube and medical admission.  Will plan for  consult.  Discussed with hospitalist.  Agrees for admission.  Final Clinical Impressions(s) / ED Diagnoses   Final diagnoses:  Small bowel obstruction ([place])  ED Discharge Orders    None       Desma Mcgregor 04/17/18 1002    Orpah Greek, MD 04/18/18 [phone]

## 2018-04-17 NOTE — Consult Note (Signed)
[facility] Surgery Consult/Admission Note  Bethany [name] 10/03/1956  [id number].    Requesting MD: Providence Lanius, PA-C Chief Complaint/Reason for Consult: SBO  HPI:   Patient is a 62 year old female S/P right total knee replacement on 6/11 who presented to the emergency department with complaints of abdominal pain, nausea, vomiting. patient states she had an enema prior to discharge from the hospital and had a small bowel movement. While at home she tried to saline enemas without any bowel movements. Yesterday around 10 PM she began having worsening abdominal pain and vomiting. More emesis this morning around 0230. Patient states emesis was green. She has not had any flatus since onset of vomiting. Abdominal pain has improved but still with generalized, nonradiating, mild abdominal pain. No other associated symptoms. Husband at bedside. Patient has been taking aspirin. No other anticoagulation. History of tubal ligation and vaginal hysterectomy. Patient states a history of constipation.    ROS:  Review of Systems  Constitutional: Negative for chills, diaphoresis and fever.  HENT: Negative for sore throat.   Respiratory: Negative for cough and shortness of breath.   Cardiovascular: Negative for chest pain.  Gastrointestinal: Positive for abdominal pain, constipation, nausea and vomiting. Negative for blood in stool and diarrhea.  Genitourinary: Negative for dysuria.  Skin: Negative for rash.  Neurological: Negative for dizziness and loss of consciousness.  All other systems reviewed and are negative.    Family History  Problem Relation Age of Onset  . Hypertension Mother   . Cancer Father 68       Lymphoma  . Breast cancer Sister 30  . Colon cancer Neg Hx     Past Medical History:  Diagnosis Date  . Anxiety   . Arthritis   . Cervical dysplasia 1990   CIN 2 subsequent cryosurgery. All Paps normal afterwards  . Dyslipidemia   . Herpes genitalis   . Herpes labialis    . Hypercholesteremia   . IBS (irritable bowel syndrome)   . Osteopenia 04/2014   T score -1.7 FRAX 7.3%/0.8% stable from prior DEXA 2013  . Raynaud disease     Past Surgical History:  Procedure Laterality Date  . AUGMENTATION MAMMAPLASTY    . BREAST ENHANCEMENT SURGERY  2006  . COSMETIC SURGERY    . EYE SURGERY     lasik od  . GYNECOLOGIC CRYOSURGERY  1990  . LIGAMENT REPAIR Right 09/24/2015   Procedure: RIGHT INDEX METACARPAL PHALANGEAL RADIAL COLLATERAL LIGAMENT REPAIR;  Surgeon: Leanora Cover, MD;  Location: [facility];  Service: Orthopedics;  Laterality: Right;  . OOPHORECTOMY     BSO  . Right knee arthoscopy Right 09/09/2017   [facility]  . TONSILLECTOMY AND ADENOIDECTOMY    . TOTAL KNEE ARTHROPLASTY Right 04/12/2018  . TOTAL KNEE ARTHROPLASTY Right 04/12/2018   Procedure: RIGHT TOTAL KNEE ARTHROPLASTY;  Surgeon: Melrose Nakayama, MD;  Location: [facility];  Service: Orthopedics;  Laterality: Right;  . TUBAL LIGATION    . UPPER GASTROINTESTINAL ENDOSCOPY  04/24/05  . VAGINAL HYSTERECTOMY  2005   TVH BSO  adenomyosis    Social History:  reports that she quit smoking about 12 years ago. She has never used smokeless tobacco. She reports that she does not drink alcohol or use drugs.  Allergies:  Allergies  Allergen Reactions  . Morphine And Related Nausea Only     (Not in a hospital admission)  Blood pressure 126/74, pulse 94, temperature 97.9 F (36.6 C), temperature source Oral, resp. rate 14, SpO2 100 %.  Physical Exam  Constitutional: She is oriented to person, place, and time. She appears well-developed and well-nourished.  Non-toxic appearance. She does not appear ill. No distress.  HENT:  Head: Normocephalic and atraumatic.  Nose: Nose normal.  Mouth/Throat: Oropharynx is clear and moist. No oropharyngeal exudate.  Eyes: Pupils are equal, round, and reactive to light. Conjunctivae are normal. Right eye exhibits no discharge. Left eye exhibits no  discharge. No scleral icterus.  Neck: Normal range of motion. Neck supple. No thyromegaly present.  Cardiovascular: Normal rate, regular rhythm, normal heart sounds and intact distal pulses.  No murmur heard. Pulses:      Radial pulses are 2+ on the right side, and 2+ on the left side.       Dorsalis pedis pulses are 2+ on the right side, and 2+ on the left side.  Pulmonary/Chest: Effort normal and breath sounds normal. No respiratory distress. She has no wheezes. She has no rhonchi. She has no rales.  Abdominal: Soft. Normal appearance. She exhibits no distension. Bowel sounds are decreased. There is no hepatosplenomegaly. There is generalized tenderness. There is no rigidity and no guarding.  Musculoskeletal: Normal range of motion. She exhibits no edema, tenderness or deformity.  Right knee with surgical bandage in place, did not assess ROM of right knee  Lymphadenopathy:    She has no cervical adenopathy.  Neurological: She is alert and oriented to person, place, and time.  Skin: Skin is warm and dry. No rash noted. She is not diaphoretic.  Psychiatric: She has a normal mood and affect.  Nursing note and vitals reviewed.   Results for orders placed or performed during the hospital encounter of 04/17/18 (from the past 48 hour(s))  Urinalysis, Routine w reflex microscopic     Status: Abnormal   Collection Time: 04/17/18  3:50 AM  Result Value Ref Range   Color, Urine YELLOW YELLOW   APPearance HAZY (A) CLEAR   Specific Gravity, Urine 1.024 1.005 - 1.030   pH 5.0 5.0 - 8.0   Glucose, UA NEGATIVE NEGATIVE mg/dL   Hgb urine dipstick NEGATIVE NEGATIVE   Bilirubin Urine NEGATIVE NEGATIVE   Ketones, ur 20 (A) NEGATIVE mg/dL   Protein, ur 30 (A) NEGATIVE mg/dL   Nitrite NEGATIVE NEGATIVE   Leukocytes, UA NEGATIVE NEGATIVE   RBC / HPF 0-5 0 - 5 RBC/hpf   WBC, UA 0-5 0 - 5 WBC/hpf   Bacteria, UA RARE (A) NONE SEEN   Squamous Epithelial / LPF 0-5 0 - 5   Mucus PRESENT    Hyaline Casts,  UA PRESENT     Comment: Performed at [facility], [address], [place], [place] [address]  Lipase, blood     Status: None   Collection Time: 04/17/18  3:53 AM  Result Value Ref Range   Lipase 26 11 - 51 U/L    Comment: Performed at [facility] [address] [place], [place] [address]  Comprehensive metabolic panel     Status: Abnormal   Collection Time: 04/17/18  3:53 AM  Result Value Ref Range   Sodium 139 135 - 145 mmol/L   Potassium 3.4 (L) 3.5 - 5.1 mmol/L   Chloride 93 (L) 101 - 111 mmol/L   CO2 35 (H) 22 - 32 mmol/L   Glucose, Bld 137 (H) 65 - 99 mg/dL   BUN 10 6 - 20 mg/dL   Creatinine, Ser 0.92 0.44 - 1.00 mg/dL   Calcium 9.2 8.9 - 10.3 mg/dL  Total Protein 6.8 6.5 - 8.1 g/dL   Albumin 3.6 3.5 - 5.0 g/dL   AST 23 15 - 41 U/L   ALT 14 14 - 54 U/L   Alkaline Phosphatase 49 38 - 126 U/L   Total Bilirubin 0.9 0.3 - 1.2 mg/dL   GFR calc non Af Amer >60 >60 mL/min   GFR calc Af Amer >60 >60 mL/min    Comment: (NOTE) The eGFR has been calculated using the CKD EPI equation. This calculation has not been validated in all clinical situations. eGFR's persistently <60 mL/min signify possible Chronic Kidney Disease.    Anion gap 11 5 - 15    Comment: Performed at [facility] [address], [place], [place] [address]  CBC     Status: Abnormal   Collection Time: 04/17/18  3:53 AM  Result Value Ref Range   WBC 5.5 4.0 - 10.5 K/uL   RBC 3.67 (L) 3.87 - 5.11 MIL/uL   Hemoglobin 12.3 12.0 - 15.0 g/dL   HCT 38.0 36.0 - 46.0 %   MCV 103.5 (H) 78.0 - 100.0 fL   MCH 33.5 26.0 - 34.0 pg   MCHC 32.4 30.0 - 36.0 g/dL   RDW 12.2 11.5 - 15.5 %   Platelets 216 150 - 400 K/uL    Comment: Performed at [facility], [place] [address] [place], [place] [address]   Ct Abdomen Pelvis W Contrast  Result Date: 04/17/2018 CLINICAL DATA:  Constipation, abdominal pain, vomiting EXAM: CT ABDOMEN AND PELVIS WITH CONTRAST TECHNIQUE: Multidetector CT imaging of the  abdomen and pelvis was performed using the standard protocol following bolus administration of intravenous contrast. CONTRAST:  168m OMNIPAQUE IOHEXOL 300 MG/ML  SOLN COMPARISON:  None. FINDINGS: Lower chest: Bilateral breast implants partially imaged. Linear atelectasis or scarring at the right base. No effusions. Hepatobiliary: No focal hepatic abnormality. Gallbladder unremarkable. Pancreas: No focal abnormality or ductal dilatation. Spleen: No focal abnormality.  Normal size. Adrenals/Urinary Tract: No adrenal abnormality. No focal renal abnormality. No stones or hydronephrosis. Urinary bladder is unremarkable. Stomach/Bowel: Dilated fluid-filled small bowel loops with air-fluid levels noted throughout the abdomen and pelvis. Distal small bowel loops appear decompressed. Exact transition is not visualize, but findings compatible with moderate to high-grade partial small bowel obstruction. Large bowel decompressed, grossly unremarkable. Vascular/Lymphatic: No evidence of aneurysm or adenopathy. Reproductive: Prior hysterectomy.  No adnexal masses. Other: Small amount of free fluid in the pelvis.  No free air. Musculoskeletal: No acute bony abnormality. L5 related grade 1 anterolisthesis of to facet disease L4 on. Degenerative disc disease at L4-5. IMPRESSION: Findings compatible with moderate to high-grade partial small bowel obstruction. Exact transition and cause not visualized, presumably adhesions. Prior hysterectomy. Electronically Signed   By: KRolm BaptiseM.D.   On: 04/17/2018 09:11      Assessment/Plan  SBO - S/P total right knee on 6/11 - CT showed moderate to high-grade partial small bowel obstruction - this is likely 2/2 chronic constipation in setting of opiate use versus adhesions with only history of abdominal surgery being tubal ligation - SBO protoc/ol with Gastrografin, NGT  FEN: NGT to LIWS, NPO VTE; SCD's, lovenox ID: none  Follow up: TBD  Plan: SBO protocol. We will follow.  Thank you for the consult.  JKalman Drape PHen[facility] Hospital[facility] 04/17/2018, 10:30 AM Pager: [phone]: [phone]-Fri 7:00 am-4:30 pm Sat-Sun 7:00 am-11:30 am

## 2018-04-17 NOTE — ED Notes (Signed)
Attempted to call report to floor 

## 2018-04-17 NOTE — ED Triage Notes (Signed)
Pt c/o constipation/abdominal pain and vomiting. Pt had TKR last Tuesday, has been constipated since, states she has been taking narcotics for pain relief at home.

## 2018-04-18 ENCOUNTER — Inpatient Hospital Stay (HOSPITAL_COMMUNITY): Payer: 59

## 2018-04-18 DIAGNOSIS — F39 Unspecified mood [affective] disorder: Secondary | ICD-10-CM

## 2018-04-18 LAB — BASIC METABOLIC PANEL
ANION GAP: 12 (ref 5–15)
BUN: 16 mg/dL (ref 6–20)
CALCIUM: 8.7 mg/dL — AB (ref 8.9–10.3)
CO2: 29 mmol/L (ref 22–32)
CREATININE: 0.98 mg/dL (ref 0.44–1.00)
Chloride: 101 mmol/L (ref 101–111)
Glucose, Bld: 81 mg/dL (ref 65–99)
Potassium: 3.3 mmol/L — ABNORMAL LOW (ref 3.5–5.1)
Sodium: 142 mmol/L (ref 135–145)

## 2018-04-18 LAB — CBC
HCT: 32.6 % — ABNORMAL LOW (ref 36.0–46.0)
HEMOGLOBIN: 10.7 g/dL — AB (ref 12.0–15.0)
MCH: 33.4 pg (ref 26.0–34.0)
MCHC: 32.8 g/dL (ref 30.0–36.0)
MCV: 101.9 fL — ABNORMAL HIGH (ref 78.0–100.0)
PLATELETS: 203 10*3/uL (ref 150–400)
RBC: 3.2 MIL/uL — AB (ref 3.87–5.11)
RDW: 12.1 % (ref 11.5–15.5)
WBC: 5.4 10*3/uL (ref 4.0–10.5)

## 2018-04-18 MED ORDER — ORAL CARE MOUTH RINSE
15.0000 mL | Freq: Two times a day (BID) | OROMUCOSAL | Status: DC
  Administered 2018-04-18 – 2018-04-30 (×17): 15 mL via OROMUCOSAL

## 2018-04-18 MED ORDER — ENOXAPARIN SODIUM 40 MG/0.4ML ~~LOC~~ SOLN
40.0000 mg | SUBCUTANEOUS | Status: DC
  Administered 2018-04-19 – 2018-04-21 (×3): 40 mg via SUBCUTANEOUS
  Filled 2018-04-18 (×3): qty 0.4

## 2018-04-18 MED ORDER — LORAZEPAM 2 MG/ML IJ SOLN
1.0000 mg | INTRAMUSCULAR | Status: DC | PRN
  Administered 2018-04-18 – 2018-04-25 (×14): 1 mg via INTRAVENOUS
  Filled 2018-04-18 (×14): qty 1

## 2018-04-18 MED ORDER — MAGNESIUM SULFATE 2 GM/50ML IV SOLN
2.0000 g | Freq: Once | INTRAVENOUS | Status: AC
  Administered 2018-04-18: 2 g via INTRAVENOUS
  Filled 2018-04-18: qty 50

## 2018-04-18 MED ORDER — POTASSIUM CHLORIDE 10 MEQ/100ML IV SOLN
10.0000 meq | INTRAVENOUS | Status: AC
  Filled 2018-04-18: qty 100

## 2018-04-18 MED ORDER — WHITE PETROLATUM EX OINT
TOPICAL_OINTMENT | CUTANEOUS | Status: AC
  Administered 2018-04-18: 0.2
  Filled 2018-04-18: qty 28.35

## 2018-04-18 NOTE — Plan of Care (Signed)
  Problem: Activity: Goal: Risk for activity intolerance will decrease Outcome: Progressing   Problem: Pain Managment: Goal: General experience of comfort will improve Outcome: Progressing   

## 2018-04-18 NOTE — Care Management Note (Signed)
Case Management Note  Patient Details  Name: KAISHA WACHOB MRN: [id number] Date of Birth: April 25, 1956  Subjective/Objective:                    Action/Plan:  04-12-18 RIGHT TKR, from home HHPT with [facility] . Will need resumption of care orders.   Expected Discharge Date:                  Expected Discharge Plan:  [place]  In-House Referral:     Discharge planning Services  CM Consult  Post Acute Care Choice:  Home Health Choice offered to:  Patient  DME Arranged:    DME Agency:     HH Arranged:    [place] Agency:     Status of Service:  In process, will continue to follow  If discussed at Long Length of Stay Meetings, dates discussed:    Additional Comments:  Marilu Favre, RN 04/18/2018, 12:31 PM

## 2018-04-18 NOTE — Progress Notes (Signed)
Subjective/Chief Complaint: Pt about the same  1 small BM last night    Objective: Vital signs in last 24 hours: Temp:  [98.2 F (36.8 C)-98.4 F (36.9 C)] 98.4 F (36.9 C) (06/17 0445) Pulse Rate:  [81-95] 84 (06/17 0445) Resp:  [11-16] 16 (06/17 0445) BP: (114-139)/(64-74) 131/70 (06/17 0445) SpO2:  [93 %-100 %] 100 % (06/17 0445) Weight:  [55 kg (121 lb 4 oz)] 55 kg (121 lb 4 oz) (06/16 1228) Last BM Date: 04/17/18(after enema)  Intake/Output from previous day: 06/16 0701 - 06/17 0700 In: 1802.1 [P.O.:60; I.V.:741.7; IV Piggyback:1000.4] Out: 2100 [Emesis/NG output:2100] Intake/Output this shift: No intake/output data recorded.  General appearance: alert, cooperative and mild distress GI: distended but soft no rebound or guarging  Extremities: extremities normal, atraumatic, no cyanosis or edema  Lab Results:  Recent Labs    04/17/18 0353 04/18/18 0333  WBC 5.5 5.4  HGB 12.3 10.7*  HCT 38.0 32.6*  PLT 216 203   BMET Recent Labs    04/17/18 0353 04/18/18 0333  NA 139 142  K 3.4* 3.3*  CL 93* 101  CO2 35* 29  GLUCOSE 137* 81  BUN 10 16  CREATININE 0.92 0.98  CALCIUM 9.2 8.7*   PT/INR No results for input(s): LABPROT, INR in the last 72 hours. ABG No results for input(s): PHART, HCO3 in the last 72 hours.  Invalid input(s): PCO2, PO2  Studies/Results: Ct Abdomen Pelvis W Contrast  Result Date: 04/17/2018 CLINICAL DATA:  Constipation, abdominal pain, vomiting EXAM: CT ABDOMEN AND PELVIS WITH CONTRAST TECHNIQUE: Multidetector CT imaging of the abdomen and pelvis was performed using the standard protocol following bolus administration of intravenous contrast. CONTRAST:  12mL OMNIPAQUE IOHEXOL 300 MG/ML  SOLN COMPARISON:  None. FINDINGS: Lower chest: Bilateral breast implants partially imaged. Linear atelectasis or scarring at the right base. No effusions. Hepatobiliary: No focal hepatic abnormality. Gallbladder unremarkable. Pancreas: No focal  abnormality or ductal dilatation. Spleen: No focal abnormality.  Normal size. Adrenals/Urinary Tract: No adrenal abnormality. No focal renal abnormality. No stones or hydronephrosis. Urinary bladder is unremarkable. Stomach/Bowel: Dilated fluid-filled small bowel loops with air-fluid levels noted throughout the abdomen and pelvis. Distal small bowel loops appear decompressed. Exact transition is not visualize, but findings compatible with moderate to high-grade partial small bowel obstruction. Large bowel decompressed, grossly unremarkable. Vascular/Lymphatic: No evidence of aneurysm or adenopathy. Reproductive: Prior hysterectomy.  No adnexal masses. Other: Small amount of free fluid in the pelvis.  No free air. Musculoskeletal: No acute bony abnormality. L5 related grade 1 anterolisthesis of to facet disease L4 on. Degenerative disc disease at L4-5. IMPRESSION: Findings compatible with moderate to high-grade partial small bowel obstruction. Exact transition and cause not visualized, presumably adhesions. Prior hysterectomy. Electronically Signed   By: Rolm Baptise M.D.   On: 04/17/2018 09:11   Dg Abd Portable 1v-small Bowel Obstruction Protocol-initial, 8 Hr Delay  Result Date: 04/18/2018 CLINICAL DATA:  Follow up small bowel obstruction. EXAM: PORTABLE ABDOMEN - 1 VIEW COMPARISON:  Abdominal radiograph April 17, 2018 FINDINGS: Persistent gas distended small bowel measuring to 4.2 cm. Paucity of large bowel gas. Nasogastric tube tip projects at proximal stomach. Phleboliths project in the pelvis. Soft tissue and osseous structures non suspicious. IMPRESSION: Unchanged small bowel obstruction. Nasogastric tube tip projects in proximal stomach. Electronically Signed   By: Elon Alas M.D.   On: 04/18/2018 01:42   Dg Abd Portable 1v-small Bowel Protocol-position Verification  Result Date: 04/17/2018 CLINICAL DATA:  Confirm NG tube placement. EXAM:  PORTABLE ABDOMEN - 1 VIEW COMPARISON:  04/17/2018.  FINDINGS: Enteric tube is identified with tip 2.8 cm below the level of the GE junction. The side port is 5 cm above the GE junction. Persistent small bowel dilatation compatible with obstruction. IMPRESSION: The tip of the NG tube projects over the proximal stomach just below the level of the GE junction and the side port is above the GE junction. Advise further tube advancement such that the side port is at least below the GE junction. Electronically Signed   By: Kerby Moors M.D.   On: 04/17/2018 10:50    Anti-infectives: Anti-infectives (From admission, onward)   None      Assessment/Plan: Patient Active Problem List   Diagnosis Date Noted  . Hypercholesteremia 04/17/2018  . IBS (irritable bowel syndrome) 04/17/2018  . SBO (small bowel obstruction) ([place]) 04/17/2018  . Primary localized osteoarthritis of right knee 04/12/2018  . Primary osteoarthritis of right knee 04/12/2018  . Degenerative joint disease of knee, left 11/18/2017  . Nonallopathic lesion of thoracic region 08/05/2017  . Nonallopathic lesion of lumbosacral region 08/05/2017  . Nonallopathic lesion of sacral region 08/05/2017  . Acute lateral meniscus tear of right knee 01/06/2017  . Hemorrhoid 04/21/2016  . Tubular adenoma of colon 04/21/2016  . Chronic back pain 09/07/2013  . Mood disorder ([place])   . Hyperlipidemia with target LDL less than 100 06/17/2011  . Raynaud's disease 06/17/2011  . Recurrent herpes labialis 06/17/2011    Continue NGT and bowel rest Repeat films in am If no progress may need ex lap    LOS: 1 day    Joyice Faster Ameliah Baskins 04/18/2018

## 2018-04-18 NOTE — Progress Notes (Signed)
PROGRESS NOTE  Bethany [name] EYC:[id number] DOB: June 28, 1956 DOA: 04/17/2018 PCP: Bethany Lung, MD  HPI/Recap of past 24 hours: Bethany [name] is a 62 year old female with past medical history significant for osteoarthritis of the right knee status post right total knee replacement on 04/12/2018 who presented with worsening generalized abdominal pain.  CT abdomen pelvis with contrast done on 04/17/2018 revealed moderate to high-grade partial small bowel obstruction.  Prior hysterectomy noted on imaging.  Recently on opiates however stated that she was taking laxative and stool softener along with her pain medications.  History of tubular adenoma of colon one year ago.  Admitted for partial small bowel obstruction.  General surgery consulted and following.  04/18/2018: Patient seen and examined at her bedside.  Reports abdominal discomfort 7-8 out of 10.  Self-reported 1 small bowel movement last night.  Denies flatulence this morning.  Assessment/Plan: Principal Problem:   SBO (small bowel obstruction) (H[facility]) Active Problems:   Raynaud's disease   Recurrent herpes labialis   Mood disorder (HCC)   Tubular adenoma of colon   Primary osteoarthritis of right knee   IBS (irritable bowel syndrome)  Partial small bowel obstruction NG tube in place Monitor output from NG tube Monitor for flatulence or bowel movements Mobilize if okay with surgery Avoid opiates IV Toradol as needed for moderate pain  Status post right total knee replacement Orthopedic surgery informed of patient's admission Continue DVT prophylaxis Pain management with IV Toradol due to partial small bowel obstructive  Hyperlipidemia Continue to hold statin due to n.p.o.  Hypokalemia Potassium 3.3 Repleted with p.o. KCl and magnesium IV 2 g BMP in the morning  Chronic macrocytic anemia Hemoglobin 10.7; baseline hemoglobin 12 MCV 101.9 Obtain folate and vitamin B12 level Possibly exacerbated by hemodilution; on IV  fluids 125 cc/h of normal saline Reduce rate to 100 cc/h of normal saline when n.p.o.   Code Status: Full code  Family Communication: None at bedside  Disposition Plan: Home/SNF when clinically stable   Consultants:  Surgery  Procedures:  None  Antimicrobials:  None  DVT prophylaxis: Subcu Lovenox   Objective: Vitals:   04/17/18 1228 04/17/18 1339 04/17/18 2124 04/18/18 0445  BP:  121/69 139/64 131/70  Pulse:  85 81 84  Resp: 16 16 16 16   Temp:  98.2 F (36.8 C) 98.2 F (36.8 C) 98.4 F (36.9 C)  TempSrc:  Oral Oral Oral  SpO2:  98% 100% 100%  Weight: 55 kg (121 lb 4 oz)     Height: 5\' 5"  (1.651 m)       Intake/Output Summary (Last 24 hours) at 04/18/2018 1114 Last data filed at 04/18/2018 1024 Gross per 24 hour  Intake 801.67 ml  Output 2700 ml  Net -1898.33 ml   Filed Weights   04/17/18 1228  Weight: 55 kg (121 lb 4 oz)    Exam:  . General: 62 y.o. year-old female well developed well nourished in no acute distress.  Alert and oriented x3.  Tube in place. . Cardiovascular: Regular rate and rhythm with no rubs or gallops.  No thyromegaly or JVD noted.   Marland Kitchen Respiratory: Clear to auscultation with no wheezes or rales. Good inspiratory effort. . Abdomen: Mildly distended, mildly tender on palpation diffusely.  Bowel sounds noted on the right and left quadrants. . Musculoskeletal: No lower extremity edema. 2/4 pulses in all 4 extremities.  Post Right knee replacement. Marland Kitchen Psychiatry: Mood is appropriate for condition and setting   Data Reviewed: CBC: Recent Labs  Lab 04/17/18 0353 04/18/18 0333  WBC 5.5 5.4  HGB 12.3 10.7*  HCT 38.0 32.6*  MCV 103.5* 101.9*  PLT 216 970   Basic Metabolic Panel: Recent Labs  Lab 04/17/18 0353 04/18/18 0333  NA 139 142  K 3.4* 3.3*  CL 93* 101  CO2 35* 29  GLUCOSE 137* 81  BUN 10 16  CREATININE 0.92 0.98  CALCIUM 9.2 8.7*   GFR: Estimated Creatinine Clearance: 51.7 mL/min (by C-G formula based on SCr of  0.98 mg/dL). Liver Function Tests: Recent Labs  Lab 04/17/18 0353  AST 23  ALT 14  ALKPHOS 49  BILITOT 0.9  PROT 6.8  ALBUMIN 3.6   Recent Labs  Lab 04/17/18 0353  LIPASE 26   No results for input(s): AMMONIA in the last 168 hours. Coagulation Profile: No results for input(s): INR, PROTIME in the last 168 hours. Cardiac Enzymes: No results for input(s): CKTOTAL, CKMB, CKMBINDEX, TROPONINI in the last 168 hours. BNP (last 3 results) No results for input(s): PROBNP in the last 8760 hours. HbA1C: No results for input(s): HGBA1C in the last 72 hours. CBG: No results for input(s): GLUCAP in the last 168 hours. Lipid Profile: No results for input(s): CHOL, HDL, LDLCALC, TRIG, CHOLHDL, LDLDIRECT in the last 72 hours. Thyroid Function Tests: No results for input(s): TSH, T4TOTAL, FREET4, T3FREE, THYROIDAB in the last 72 hours. Anemia Panel: No results for input(s): VITAMINB12, FOLATE, FERRITIN, TIBC, IRON, RETICCTPCT in the last 72 hours. Urine analysis:    Component Value Date/Time   COLORURINE YELLOW 04/17/2018 0350   APPEARANCEUR HAZY (A) 04/17/2018 0350   LABSPEC 1.024 04/17/2018 0350   LABSPEC 1.030 08/04/2017 0920   PHURINE 5.0 04/17/2018 0350   GLUCOSEU NEGATIVE 04/17/2018 0350   HGBUR NEGATIVE 04/17/2018 0350   BILIRUBINUR NEGATIVE 04/17/2018 0350   BILIRUBINUR negative 08/04/2017 0920   BILIRUBINUR n 09/16/2016 1559   KETONESUR 20 (A) 04/17/2018 0350   PROTEINUR 30 (A) 04/17/2018 0350   UROBILINOGEN 0.2 09/16/2016 1559   UROBILINOGEN 2 (H) 05/23/2015 1252   NITRITE NEGATIVE 04/17/2018 0350   LEUKOCYTESUR NEGATIVE 04/17/2018 0350   Sepsis Labs: @LABRCNTIP (procalcitonin:4,lacticidven:4)  )No results found for this or any previous visit (from the past 240 hour(s)).    Studies: Dg Abd Portable 1v-small Bowel Obstruction Protocol-initial, 8 Hr Delay  Result Date: 04/18/2018 CLINICAL DATA:  Follow up small bowel obstruction. EXAM: PORTABLE ABDOMEN - 1 VIEW  COMPARISON:  Abdominal radiograph April 17, 2018 FINDINGS: Persistent gas distended small bowel measuring to 4.2 cm. Paucity of large bowel gas. Nasogastric tube tip projects at proximal stomach. Phleboliths project in the pelvis. Soft tissue and osseous structures non suspicious. IMPRESSION: Unchanged small bowel obstruction. Nasogastric tube tip projects in proximal stomach. Electronically Signed   By: Elon Alas M.D.   On: 04/18/2018 01:42    Scheduled Meds: . enoxaparin (LOVENOX) injection  30 mg Subcutaneous Q24H  . mouth rinse  15 mL Mouth Rinse BID    Continuous Infusions: . sodium chloride 125 mL/hr at 04/18/18 0248  . potassium chloride 10 mEq (04/18/18 1010)     LOS: 1 day     Kayleen Memos, MD [facility] Pager [phone]  If 7PM-7AM, please contact night-coverage [email or web address] Password [email or web address] 04/18/2018, 11:14 AM

## 2018-04-18 NOTE — Evaluation (Signed)
Physical Therapy Evaluation Patient Details Name: Bethany [name] MRN: [id number] DOB: Nov 09, 1955 Today's Date: 04/18/2018   History of Present Illness  62 year old female past medical history significant for osteoporosis with the right knee status post right total knee replacement on 04/12/2018 just discharged from our facility presents with worsening generalized abdominal pain.  She is found to have a partial small bowel obstruction likely related to inactivity and pain medications.  Clinical Impression  Pt admitted with above diagnosis. Pt currently with functional limitations due to the deficits listed below (see PT Problem List). Pt ambulated 500' with RW and min-guard A, 1 LOB to L which required min A to recover. Pt's R knee ROM limited due to decreased activity with current bowel issues. Would benefit from CPM to use while in bed at this time.  Pt will benefit from skilled PT to increase their independence and safety with mobility to allow discharge to the venue listed below.       Follow Up Recommendations Home health PT;Supervision for mobility/OOB    Equipment Recommendations  None recommended by PT    Recommendations for Other Services       Precautions / Restrictions Precautions Precautions: Fall;Knee Precaution Booklet Issued: No Precaution Comments: pt still with knee precautions since surgery just one week ago, pt aware of proper positioning Restrictions Weight Bearing Restrictions: No RLE Weight Bearing: Weight bearing as tolerated Other Position/Activity Restrictions: NG tube      Mobility  Bed Mobility Overal bed mobility: Modified Independent Bed Mobility: Supine to Sit;Sit to Supine     Supine to sit: Modified independent (Device/Increase time) Sit to supine: Modified independent (Device/Increase time)   General bed mobility comments: pt able to get in and out of bed without assist but painful to abdomen  Transfers Overall transfer level: Needs  assistance Equipment used: Rolling walker (2 wheeled) Transfers: Sit to/from Stand Sit to Stand: Supervision         General transfer comment: pt feeling weak and potassium low today (being repleted), was slightly woozy with initial standing  Ambulation/Gait Ambulation/Gait assistance: Min guard Gait Distance (Feet): 500 Feet Assistive device: Rolling walker (2 wheeled) Gait Pattern/deviations: Step-through pattern;Decreased weight shift to right Gait velocity: decreased Gait velocity interpretation: <1.8 ft/sec, indicate of risk for recurrent falls General Gait Details: pt with 1 LOB to left with min A to correct, antalgic gait, with decreased knee extension noted at heel strike  Stairs            Wheelchair Mobility    Modified Rankin (Stroke Patients Only)       Balance Overall balance assessment: Needs assistance Sitting-balance support: Feet supported Sitting balance-Leahy Scale: Good     Standing balance support: Bilateral upper extremity supported Standing balance-Leahy Scale: Fair Standing balance comment: reliance on UE support but can stand unassisted for brief period of time.                               Pertinent Vitals/Pain Pain Assessment: Faces Faces Pain Scale: Hurts even more Pain Location: R knee and abdomen Pain Descriptors / Indicators: Aching;Sore;Discomfort;Grimacing Pain Intervention(s): Limited activity within patient's tolerance;Monitored during session    [place] expects to be discharged to:: Private residence Living Arrangements: Spouse/significant other Available Help at Discharge: Family;Available 24 hours/day Type of Home: House Home Access: Stairs to enter Entrance Stairs-Rails: None Entrance Stairs-Number of Steps: 1 Home Layout: One level Home Equipment: Walker - 2  wheels;Shower seat      Prior Function Level of Independence: Independent         Comments: pt very active, exercising 6  days/week     Hand Dominance        Extremity/Trunk Assessment   Upper Extremity Assessment Upper Extremity Assessment: Overall WFL for tasks assessed    Lower Extremity Assessment Lower Extremity Assessment: RLE deficits/detail RLE Deficits / Details: pt with only 35-40 degrees active knee flexion, lacking 10 degrees extension, would benefit from CPM since she has NG tube and cannot be active RLE Sensation: WNL RLE Coordination: WNL    Cervical / Trunk Assessment Cervical / Trunk Assessment: Normal  Communication   Communication: No difficulties  Cognition Arousal/Alertness: Awake/alert Behavior During Therapy: WFL for tasks assessed/performed Overall Cognitive Status: Within Functional Limits for tasks assessed                                        General Comments      Exercises Total Joint Exercises Ankle Circles/Pumps: AROM;Both;Supine;20 reps Quad Sets: AROM;Both;20 reps;Supine Heel Slides: AROM;Right;20 reps;Supine Hip ABduction/ADduction: AROM;Right;20 reps;Supine Straight Leg Raises: AROM;Right;20 reps;Supine Goniometric ROM: 10-40 Marching in Standing: AROM;Both;10 reps;Standing   Assessment/Plan    PT Assessment Patient needs continued PT services  PT Problem List Decreased strength;Decreased range of motion;Decreased activity tolerance;Decreased mobility;Decreased balance;Decreased coordination;Decreased knowledge of use of DME;Decreased safety awareness;Decreased knowledge of precautions;Pain       PT Treatment Interventions DME instruction;Gait training;Stair training;Functional mobility training;Therapeutic activities;Therapeutic exercise;Balance training;Neuromuscular re-education;Patient/family education    PT Goals (Current goals can be found in the Care Plan section)  Acute Rehab PT Goals Patient Stated Goal: return home PT Goal Formulation: With patient Time For Goal Achievement: 05/02/18 Potential to Achieve Goals:  Good    Frequency Min 4X/week   Barriers to discharge        Co-evaluation               AM-PAC PT "6 Clicks" Daily Activity  Outcome Measure Difficulty turning over in bed (including adjusting bedclothes, sheets and blankets)?: A Little Difficulty moving from lying on back to sitting on the side of the bed? : A Little Difficulty sitting down on and standing up from a chair with arms (e.g., wheelchair, bedside commode, etc,.)?: A Little Help needed moving to and from a bed to chair (including a wheelchair)?: A Little Help needed walking in hospital room?: A Little Help needed climbing 3-5 steps with a railing? : A Little 6 Click Score: 18    End of Session   Activity Tolerance: Patient limited by pain Patient left: in bed;with call bell/phone within reach;with family/visitor present Nurse Communication: Mobility status PT Visit Diagnosis: Pain;Difficulty in walking, not elsewhere classified (R26.2);Muscle weakness (generalized) (M62.81) Pain - Right/Left: Right Pain - part of body: Knee    Time: 1043-1130 PT Time Calculation (min) (ACUTE ONLY): 47 min   Charges:   PT Evaluation $PT Eval Moderate Complexity: 1 Mod PT Treatments $Gait Training: 8-22 mins $Therapeutic Exercise: 8-22 mins   PT G Codes:        [place]  [place] 04/18/2018, 1:35 PM

## 2018-04-18 NOTE — Evaluation (Signed)
Occupational Therapy Evaluation Patient Details Name: Bethany [name] MRN: [id number] DOB: 02-14-1956 Today's Date: 04/18/2018    History of Present Illness 62 year old female past medical history significant for osteoporosis with the right knee status post right total knee replacement on 04/12/2018 just discharged from our facility presents with worsening generalized abdominal pain.  She is found to have a partial small bowel obstruction likely related to inactivity and pain medications.   Clinical Impression   Pt is typically independent. Presents with abdominal and post op R knee pain, generalized weakness and impaired standing balance interfering with ability to perform ADL and ADL transfers at her baseline. Will follow acutely. Do not anticipate pt will need post acute OT.   Follow Up Recommendations  No OT follow up    Equipment Recommendations  None recommended by OT    Recommendations for Other Services       Precautions / Restrictions Precautions Precautions: Fall;Knee Precaution Booklet Issued: No Precaution Comments: pt still with knee precautions since surgery just one week ago, pt aware of proper positioning Restrictions Weight Bearing Restrictions: No RLE Weight Bearing: Weight bearing as tolerated Other Position/Activity Restrictions: NG tube      Mobility Bed Mobility Overal bed mobility: Modified Independent Bed Mobility: Supine to Sit;Sit to Supine     Supine to sit: Modified independent (Device/Increase time) Sit to supine: Modified independent (Device/Increase time)   General bed mobility comments: pt able to get in and out of bed without assist but painful to abdomen  Transfers Overall transfer level: Needs assistance Equipment used: Rolling walker (2 wheeled) Transfers: Sit to/from Stand Sit to Stand: Supervision         General transfer comment: for safety    Balance Overall balance assessment: Needs assistance Sitting-balance support: Feet  supported Sitting balance-Leahy Scale: Good     Standing balance support: Bilateral upper extremity supported Standing balance-Leahy Scale: Fair Standing balance comment: reliance on UE support but can stand unassisted for brief period of time.                             ADL either performed or assessed with clinical judgement   ADL Overall ADL's : Needs assistance/impaired Eating/Feeding: Independent(ice chips)   Grooming: Wash/dry hands;Standing;Supervision/safety   Upper Body Bathing: Set up;Sitting   Lower Body Bathing: Minimal assistance;Sit to/from stand   Upper Body Dressing : Set up;Sitting   Lower Body Dressing: Minimal assistance;Sit to/from stand   Toilet Transfer: Supervision/safety;RW;Ambulation   Toileting- Water quality scientist and Hygiene: Supervision/safety;Sit to/from stand       Functional mobility during ADLs: Supervision/safety;Rolling walker       Vision Patient Visual Report: No change from baseline       Perception     Praxis      Pertinent Vitals/Pain Pain Assessment: Faces Faces Pain Scale: Hurts even more Pain Location: R knee and abdomen Pain Descriptors / Indicators: Aching;Sore;Discomfort;Grimacing Pain Intervention(s): Monitored during session;Repositioned     Hand Dominance Right   Extremity/Trunk Assessment Upper Extremity Assessment Upper Extremity Assessment: Overall WFL for tasks assessed   Lower Extremity Assessment Lower Extremity Assessment: Defer to PT evaluation RLE Deficits / Details: pt with only 35-40 degrees active knee flexion, lacking 10 degrees extension, would benefit from CPM since she has NG tube and cannot be active RLE Sensation: WNL RLE Coordination: WNL   Cervical / Trunk Assessment Cervical / Trunk Assessment: Normal   Communication Communication Communication: No difficulties  Cognition Arousal/Alertness: Awake/alert Behavior During Therapy: WFL for tasks  assessed/performed Overall Cognitive Status: Within Functional Limits for tasks assessed                                     General Comments       Exercises    Shoulder Instructions      Home Living Family/patient expects to be discharged to:: Private residence Living Arrangements: Spouse/significant other Available Help at Discharge: Family;Available PRN/intermittently(husband works) Type of Home: House Home Access: Stairs to enter Technical brewer of Steps: 1 Entrance Stairs-Rails: None Home Layout: One level     Bathroom Shower/Tub: Occupational psychologist: Handicapped height Bathroom Accessibility: Yes How Accessible: Accessible via walker Home Equipment: [place] - 2 wheels;Shower seat;Other (comment)(walker bag)          Prior Functioning/Environment Level of Independence: Independent        Comments: pt very active, exercising 6 days/week        OT Problem List: Impaired balance (sitting and/or standing);Pain      OT Treatment/Interventions: Self-care/ADL training;DME and/or AE instruction;Therapeutic activities;Balance training;Patient/family education    OT Goals(Current goals can be found in the care plan section) Acute Rehab OT Goals Patient Stated Goal: return home OT Goal Formulation: With patient Time For Goal Achievement: 05/02/18 Potential to Achieve Goals: Good ADL Goals Pt Will Perform Grooming: with modified independence;standing Pt Will Perform Lower Body Bathing: with modified independence;sit to/from stand Pt Will Perform Lower Body Dressing: with modified independence;sit to/from stand Pt Will Transfer to Toilet: with modified independence;ambulating Pt Will Perform Toileting - Clothing Manipulation and hygiene: with modified independence;sit to/from stand Pt Will Perform Tub/Shower Transfer: Shower transfer;with supervision;ambulating;shower seat;rolling walker  OT Frequency: Min 2X/week   Barriers to  D/C:            Co-evaluation              AM-PAC PT "6 Clicks" Daily Activity     Outcome Measure Help from another person eating meals?: None Help from another person taking care of personal grooming?: A Little Help from another person toileting, which includes using toliet, bedpan, or urinal?: A Little Help from another person bathing (including washing, rinsing, drying)?: A Little Help from another person to put on and taking off regular upper body clothing?: None Help from another person to put on and taking off regular lower body clothing?: A Little 6 Click Score: 20   End of Session Equipment Utilized During Treatment: Gait belt;Rolling walker  Activity Tolerance: Patient tolerated treatment well Patient left: in bed;with call bell/phone within reach;with family/visitor present(PA present)  OT Visit Diagnosis: Other abnormalities of gait and mobility (R26.89);Pain                Time: 1347-1405 OT Time Calculation (min): 18 min Charges:  OT General Charges $OT Visit: 1 Visit OT Evaluation $OT Eval Moderate Complexity: 1 Mod G-Codes:     Malka So 04/18/2018, 2:14 PM  04/18/2018 Nestor Lewandowsky, OTR/L Pager: [phone]

## 2018-04-18 NOTE — Progress Notes (Signed)
Patient alerted RN that IV potassium was burning.This RN reduced rate to see if patient could tolerate it. Patient called RN back to the room because the potassium was still burning. IV potassium stopped. Attending physician notified.Awaiting call back from attending. Will continue to monitor.

## 2018-04-18 NOTE — Progress Notes (Signed)
Orthopedic Tech Progress Note Patient Details:  Bethany [name] 1956/01/28 [id number]  CPM Right Knee CPM Right Knee: On Right Knee Flexion (Degrees): 75 Right Knee Extension (Degrees): 0 Additional Comments: start time for cpm was 1615  Post Interventions Patient Tolerated: Well Instructions Provided: Care of device  Braulio Bosch 04/18/2018, 4:23 PM

## 2018-04-18 NOTE — Progress Notes (Signed)
Subjective:   Patient is about 1 weeks s/p right TKR with subsequent small bowel obstruction. Her knee is doing well and she is up with PT.  Activity level:  WBAT Patient reports pain as mild.    Objective: Vital signs in last 24 hours: Temp:  [98.1 F (36.7 C)-98.4 F (36.9 C)] 98.1 F (36.7 C) (06/17 1411) Pulse Rate:  [81-87] 87 (06/17 1411) Resp:  [16-17] 17 (06/17 1411) BP: (131-143)/(64-70) 143/68 (06/17 1411) SpO2:  [100 %] 100 % (06/17 1411)  Labs: Recent Labs    04/17/18 0353 04/18/18 0333  HGB 12.3 10.7*   Recent Labs    04/17/18 0353 04/18/18 0333  WBC 5.5 5.4  RBC 3.67* 3.20*  HCT 38.0 32.6*  PLT 216 203   Recent Labs    04/17/18 0353 04/18/18 0333  NA 139 142  K 3.4* 3.3*  CL 93* 101  CO2 35* 29  BUN 10 16  CREATININE 0.92 0.98  GLUCOSE 137* 81  CALCIUM 9.2 8.7*   No results for input(s): LABPT, INR in the last 72 hours.  Physical Exam:  Neurologically intact Neurovascular intact Sensation intact distally Intact pulses distally Dorsiflexion/Plantar flexion intact Incision: dressing C/D/I and no drainage No cellulitis present Compartment soft  Assessment/Plan: S/p Right TKR 04/12/18 with post op small bowel obstruction.    We greatly appreciate medical management.  She will be up with PT/OT. Her knee is doing welll but due to her NG tube it is difficult to get up and move so we will set her up with a CPM while in the hospital. We will continue to follow her while she is here.  Follow up in office 2 weeks post op.  Nikeya Maxim, Larwance Sachs 04/18/2018, 2:14 PM

## 2018-04-19 ENCOUNTER — Inpatient Hospital Stay (HOSPITAL_COMMUNITY): Payer: 59

## 2018-04-19 DIAGNOSIS — D126 Benign neoplasm of colon, unspecified: Secondary | ICD-10-CM

## 2018-04-19 DIAGNOSIS — I7300 Raynaud's syndrome without gangrene: Secondary | ICD-10-CM

## 2018-04-19 DIAGNOSIS — M1711 Unilateral primary osteoarthritis, right knee: Secondary | ICD-10-CM

## 2018-04-19 LAB — BASIC METABOLIC PANEL
ANION GAP: 12 (ref 5–15)
BUN: 21 mg/dL — AB (ref 6–20)
CO2: 27 mmol/L (ref 22–32)
Calcium: 8.7 mg/dL — ABNORMAL LOW (ref 8.9–10.3)
Chloride: 105 mmol/L (ref 101–111)
Creatinine, Ser: 0.84 mg/dL (ref 0.44–1.00)
Glucose, Bld: 84 mg/dL (ref 65–99)
POTASSIUM: 3.3 mmol/L — AB (ref 3.5–5.1)
SODIUM: 144 mmol/L (ref 135–145)

## 2018-04-19 LAB — CBC
HCT: 31.4 % — ABNORMAL LOW (ref 36.0–46.0)
HEMOGLOBIN: 10 g/dL — AB (ref 12.0–15.0)
MCH: 32.9 pg (ref 26.0–34.0)
MCHC: 31.8 g/dL (ref 30.0–36.0)
MCV: 103.3 fL — ABNORMAL HIGH (ref 78.0–100.0)
PLATELETS: 218 10*3/uL (ref 150–400)
RBC: 3.04 MIL/uL — ABNORMAL LOW (ref 3.87–5.11)
RDW: 12.3 % (ref 11.5–15.5)
WBC: 5.7 10*3/uL (ref 4.0–10.5)

## 2018-04-19 LAB — VITAMIN B12: VITAMIN B 12: 530 pg/mL (ref 180–914)

## 2018-04-19 MED ORDER — POTASSIUM CHLORIDE 10 MEQ/100ML IV SOLN: 10.0000 meq | INTRAVENOUS | Status: DC

## 2018-04-19 MED ORDER — POTASSIUM CHLORIDE CRYS ER 20 MEQ PO TBCR: 40.0000 meq | EXTENDED_RELEASE_TABLET | Freq: Two times a day (BID) | ORAL | Status: DC

## 2018-04-19 MED ORDER — FENTANYL CITRATE (PF) 100 MCG/2ML IJ SOLN
25.0000 ug | Freq: Once | INTRAMUSCULAR | Status: AC
  Administered 2018-04-19: 25 ug via INTRAVENOUS

## 2018-04-19 MED ORDER — KCL-LACTATED RINGERS-D5W 20 MEQ/L IV SOLN
INTRAVENOUS | Status: AC
  Administered 2018-04-19 – 2018-04-24 (×10): via INTRAVENOUS
  Filled 2018-04-19 (×15): qty 1000

## 2018-04-19 NOTE — Progress Notes (Addendum)
PROGRESS NOTE  Bethany [name] OXB:[id number] DOB: 1956/05/24 DOA: 04/17/2018 PCP: Bethany Lung, MD  HPI/Recap of past 24 hours: Bethany [name] is a 62 year old female with past medical history significant for osteoarthritis of the right knee status post right total knee replacement on 04/12/2018 who presented with worsening generalized abdominal pain.  CT abdomen pelvis with contrast done on 04/17/2018 revealed moderate to high-grade partial small bowel obstruction.  Prior hysterectomy noted on imaging.  Recently on opiates however stated that she was taking laxative and stool softener along with her pain medications.  History of tubular adenoma of colon one year ago.  Admitted for partial small bowel obstruction.  General surgery consulted and following.  04/18/2018: Patient seen and examined at her bedside.  Reports abdominal discomfort 7-8 out of 10.  Self-reported 1 small bowel movement last night.  Denies flatulence this morning.  04/19/2018: Patient seen and examined with her Bethany [name] at bedside.  Reports persistent abdominal discomfort.  Denies flatulence this morning.  Self-reported having a bowel movement yesterday morning.  Continues to have significant output from her NG tube 1200 cc recorded in the last 24 hours.  General surgery following.  Highly appreciated.  Assessment/Plan: Principal Problem:   SBO (small bowel obstruction) (HCC) Active Problems:   Raynaud's disease   Recurrent herpes labialis   Mood disorder (HCC)   Tubular adenoma of colon   Primary osteoarthritis of right knee   IBS (irritable bowel syndrome)  Partial small bowel obstruction, appears to be persistent Continue NG tube 1200 cc of output recorded in the last 24 hours Continue to monitor NG tube output  Continue n.p.o. General surgery following Continue to mobilize Avoid opiates IV Toradol as needed for moderate pain Abdominal x-ray done yesterday 04/18/2018 revealed persistent small bowel  obstruction  Status post right total knee replacement, appears stable  Mild edema in her right lower extremity, continue to monitor Continue DVT prophylaxis Weightbearing as tolerated as recommended by orthopedic surgery The pelvic surgery outpatient 2 weeks postop Orthopedic surgery following.  Highly appreciated Pain management with IV Toradol due to partial small bowel obstruction  Hyperlipidemia Continue to hold statin due to n.p.o.  Persistent hypokalemia Potassium 3.3 from 3.3 yesterday Changed IV fluids from normal saline to D5 lactated Ringer 20 mEq KCl at 100 cc/h N.p.o. Repeat BMP and magnesium level in the morning  Chronic macrocytic anemia Hemoglobin 10.7; baseline hemoglobin 12 MCV 101.9 Obtain folate and vitamin B12 level Possibly exacerbated by hemodilution Repeat CBC in the morning  Ambulatory dysfunction due to recent R knee surgery Continue PT as recommended by orthopedic surgery   Code Status: Full code  Family Communication: Bethany [name] at bedside  Disposition Plan: Home/SNF when clinically stable; possible ex lap tomorrow 04/20/2018 if no improvement of her bowel function today.   Consultants:  Surgery  PT  Procedures:  None  Antimicrobials:  None  DVT prophylaxis: Subcu Lovenox   Objective: Vitals:   04/18/18 0445 04/18/18 1411 04/18/18 2130 04/19/18 0541  BP: 131/70 (!) 143/68 (!) 142/71 131/71  Pulse: 84 87 78 79  Resp: 16 17 16 16   Temp: 98.4 F (36.9 C) 98.1 F (36.7 C) 98.2 F (36.8 C) 98.3 F (36.8 C)  TempSrc: Oral Oral Oral Oral  SpO2: 100% 100% 99% 99%  Weight:      Height:        Intake/Output Summary (Last 24 hours) at 04/19/2018 1103 Last data filed at 04/19/2018 0700 Gross per 24 hour  Intake 880 ml  Output 600 ml  Net 280 ml   Filed Weights   04/17/18 1228  Weight: 55 kg (121 lb 4 oz)    Exam:  . General: 62 y.o. year-old female well-developed well-nourished in no acute distress.  Alert and oriented x3.   NG tube in place. . Cardiovascular: Regular rate and rhythm with no rubs or gallops.  No thyromegaly or JVD present. Marland Kitchen Respiratory: Clear to auscultation with no wheezes or rales. Good inspiratory effort. . Abdomen: Mildly distended, mildly tender on palpation diffusely.  Bowel sounds noted on the right and left quadrants. . Musculoskeletal: Mild right lower extremity edema. 2/4 pulses in all 4 extremities.  Post Right knee replacement. Marland Kitchen Psychiatry: Mood is appropriate for condition and setting   Data Reviewed: CBC: Recent Labs  Lab 04/17/18 0353 04/18/18 0333 04/19/18 0613  WBC 5.5 5.4 5.7  HGB 12.3 10.7* 10.0*  HCT 38.0 32.6* 31.4*  MCV 103.5* 101.9* 103.3*  PLT 216 203 160   Basic Metabolic Panel: Recent Labs  Lab 04/17/18 0353 04/18/18 0333 04/19/18 0613  NA 139 142 144  K 3.4* 3.3* 3.3*  CL 93* 101 105  CO2 35* 29 27  GLUCOSE 137* 81 84  BUN 10 16 21*  CREATININE 0.92 0.98 0.84  CALCIUM 9.2 8.7* 8.7*   GFR: Estimated Creatinine Clearance: 60.3 mL/min (by C-G formula based on SCr of 0.84 mg/dL). Liver Function Tests: Recent Labs  Lab 04/17/18 0353  AST 23  ALT 14  ALKPHOS 49  BILITOT 0.9  PROT 6.8  ALBUMIN 3.6   Recent Labs  Lab 04/17/18 0353  LIPASE 26   No results for input(s): AMMONIA in the last 168 hours. Coagulation Profile: No results for input(s): INR, PROTIME in the last 168 hours. Cardiac Enzymes: No results for input(s): CKTOTAL, CKMB, CKMBINDEX, TROPONINI in the last 168 hours. BNP (last 3 results) No results for input(s): PROBNP in the last 8760 hours. HbA1C: No results for input(s): HGBA1C in the last 72 hours. CBG: No results for input(s): GLUCAP in the last 168 hours. Lipid Profile: No results for input(s): CHOL, HDL, LDLCALC, TRIG, CHOLHDL, LDLDIRECT in the last 72 hours. Thyroid Function Tests: No results for input(s): TSH, T4TOTAL, FREET4, T3FREE, THYROIDAB in the last 72 hours. Anemia Panel: Recent Labs    04/19/18 0613   VITAMINB12 530   Urine analysis:    Component Value Date/Time   COLORURINE YELLOW 04/17/2018 0350   APPEARANCEUR HAZY (A) 04/17/2018 0350   LABSPEC 1.024 04/17/2018 0350   LABSPEC 1.030 08/04/2017 0920   PHURINE 5.0 04/17/2018 0350   GLUCOSEU NEGATIVE 04/17/2018 0350   HGBUR NEGATIVE 04/17/2018 0350   BILIRUBINUR NEGATIVE 04/17/2018 0350   BILIRUBINUR negative 08/04/2017 0920   BILIRUBINUR n 09/16/2016 1559   KETONESUR 20 (A) 04/17/2018 0350   PROTEINUR 30 (A) 04/17/2018 0350   UROBILINOGEN 0.2 09/16/2016 1559   UROBILINOGEN 2 (H) 05/23/2015 1252   NITRITE NEGATIVE 04/17/2018 0350   LEUKOCYTESUR NEGATIVE 04/17/2018 0350   Sepsis Labs: @LABRCNTIP (procalcitonin:4,lacticidven:4)  )No results found for this or any previous visit (from the past 240 hour(s)).    Studies: Dg Abd 1 View  Result Date: 04/19/2018 CLINICAL DATA:  Small bowel obstruction. EXAM: ABDOMEN - 1 VIEW COMPARISON:  CT abdomen and pelvis April 17, 2018; abdominal radiograph April 18, 2018 FINDINGS: Nasogastric tube tip is in the gastric cardia region. The side-port is not seen but is felt to be above the diaphragm. There remain loops of dilated small bowel, similar to 1  day prior. No free air. There are phleboliths in the pelvis. IMPRESSION: Findings indicative of a degree of persistent bowel obstruction. No free air. Nasogastric tube tip in proximal stomach with side port above the diaphragm. Advise advancing nasogastric tube approximately 8 cm to insure that side-port as well as nasogastric tube tip are within the stomach. Electronically Signed   By: Lowella Grip III M.D.   On: 04/19/2018 07:55   Dg Abd Portable 1v-small Bowel Obstruction Protocol-24 Hr Delay  Result Date: 04/18/2018 CLINICAL DATA:  Small-bowel obstruction, 24 hour delay EXAM: PORTABLE ABDOMEN - 1 VIEW COMPARISON:  Portable exam 1635 hours compared to 04/18/2018 FINDINGS: Tip of orogastric tube projects over proximal stomach though the proximal  side-port projects over the distal esophagus; recommend advancing tube 7 cm. Small amount gas within stomach. Persistent significant gaseous distention of small bowel loops consistent with small bowel obstruction, small bowel measuring up to 4.2 cm transverse slightly less than the 4.6 cm on the prior exam. No colonic gas. Questionable mild wall thickening of a single small bowel loop in the LEFT upper quadrant. New osseous structures unremarkable. [name] bases clear. IMPRESSION: Persistent small bowel obstruction. Electronically Signed   By: Lavonia Dana M.D.   On: 04/18/2018 16:57    Scheduled Meds: . enoxaparin (LOVENOX) injection  40 mg Subcutaneous Q24H  . mouth rinse  15 mL Mouth Rinse BID    Continuous Infusions: . sodium chloride 100 mL/hr at 04/19/18 0831  . dextrose 5% lactated ringers with KCl 20 mEq/L       LOS: 2 days     Kayleen Memos, MD [facility] Pager [phone]  If 7PM-7AM, please contact night-coverage [email or web address] Password [email or web address] 04/19/2018, 11:03 AM

## 2018-04-19 NOTE — Care Management Note (Signed)
Case Management Note  Patient Details  Name: Bethany [name] MRN: [id number] Date of Birth: 1956-01-26  Subjective/Objective:                    Action/Plan:   Expected Discharge Date:                  Expected Discharge Plan:  Grandfather  In-House Referral:     Discharge planning Services  CM Consult  Post Acute Care Choice:  Home Health Choice offered to:  Patient  DME Arranged:  N/A DME Agency:  NA  HH Arranged:  PT [place] Agency:  [facility] (formerly [employer])  Status of Service:  In process, will continue to follow  If discussed at Long Length of Stay Meetings, dates discussed:    Additional Comments:  Marilu Favre, RN 04/19/2018, 7:56 AM

## 2018-04-19 NOTE — Progress Notes (Signed)
Physical Therapy Treatment Patient Details Name: Bethany [name] MRN: [id number] DOB: 11/12/1955 Today's Date: 04/19/2018    History of Present Illness 62 year old female past medical history significant for osteoporosis with the right knee status post right total knee replacement on 04/12/2018 just discharged from our facility presents with worsening generalized abdominal pain.  She is found to have a partial small bowel obstruction likely related to inactivity and pain medications.    PT Comments    Pt beginning to feel weak from lack of food past days. Was still able to ambulate 500' with RW, working on R knee flexion with swing through phase. Pt pleased to have CPM to range knee while in bed, tolerating 0-75 degrees and was shown how to turn it up as tolerated with goal of 90 deg. PT will continue to follow.    Follow Up Recommendations  Home health PT;Supervision for mobility/OOB     Equipment Recommendations  None recommended by PT    Recommendations for Other Services       Precautions / Restrictions Precautions Precautions: Fall;Knee Precaution Booklet Issued: No Restrictions Weight Bearing Restrictions: No RLE Weight Bearing: Weight bearing as tolerated Other Position/Activity Restrictions: NG tube    Mobility  Bed Mobility Overal bed mobility: Modified Independent Bed Mobility: Sit to Supine       Sit to supine: Modified independent (Device/Increase time)   General bed mobility comments: pt able to lift R leg to get into CPM and position self appropriately  Transfers Overall transfer level: Needs assistance Equipment used: Rolling walker (2 wheeled) Transfers: Sit to/from Stand Sit to Stand: Modified independent (Device/Increase time)         General transfer comment: pt more steady today, supervision given for safety, pt mentions that she is beginning to feel weak from not eating  Ambulation/Gait Ambulation/Gait assistance: Supervision Gait Distance  (Feet): 500 Feet Assistive device: Rolling walker (2 wheeled) Gait Pattern/deviations: Step-through pattern;Decreased weight shift to right Gait velocity: decreased Gait velocity interpretation: 1.31 - 2.62 ft/sec, indicative of limited community ambulator General Gait Details: pt externally rotating r foot to compensate for lack of R knee flexion, able to correct this after being cued and work on increasing knee flexion with swing through   [employer] Mobility    Modified Rankin (Stroke Patients Only)       Balance Overall balance assessment: Modified Independent Sitting-balance support: Feet supported Sitting balance-Leahy Scale: Good     Standing balance support: No upper extremity supported Standing balance-Leahy Scale: Fair Standing balance comment: reliance on UE support but can stand unassisted for brief period of time.                              Cognition Arousal/Alertness: Awake/alert Behavior During Therapy: WFL for tasks assessed/performed Overall Cognitive Status: Within Functional Limits for tasks assessed                                        Exercises Total Joint Exercises Ankle Circles/Pumps: AROM;Both;Supine;20 reps Quad Sets: AROM;Both;20 reps;Supine Gluteal Sets: AROM;Both;15 reps;Supine Heel Slides: AROM;Right;20 reps;Supine Straight Leg Raises: AROM;Right;20 reps;Supine Long Arc Quad: AROM;Right;10 reps;Seated Goniometric ROM: 10-55 AROM    General Comments        Pertinent Vitals/Pain Pain Assessment: Faces Faces Pain Scale: Hurts  even more Pain Location: R knee and abdomen Pain Descriptors / Indicators: Aching;Sore;Discomfort;Grimacing Pain Intervention(s): Patient requesting pain meds-RN notified;Limited activity within patient's tolerance;Monitored during session    Home Living                      Prior Function            PT Goals (current goals can now be found in  the care plan section) Acute Rehab PT Goals Patient Stated Goal: return home PT Goal Formulation: With patient Time For Goal Achievement: 05/02/18 Potential to Achieve Goals: Good Progress towards PT goals: Progressing toward goals    Frequency    Min 4X/week      PT Plan Current plan remains appropriate    Co-evaluation              AM-PAC PT "6 Clicks" Daily Activity  Outcome Measure  Difficulty turning over in bed (including adjusting bedclothes, sheets and blankets)?: A Little Difficulty moving from lying on back to sitting on the side of the bed? : A Little Difficulty sitting down on and standing up from a chair with arms (e.g., wheelchair, bedside commode, etc,.)?: A Little Help needed moving to and from a bed to chair (including a wheelchair)?: A Little Help needed walking in hospital room?: A Little Help needed climbing 3-5 steps with a railing? : A Little 6 Click Score: 18    End of Session   Activity Tolerance: Patient tolerated treatment well Patient left: in bed;with call bell/phone within reach;with family/visitor present;in CPM Nurse Communication: Mobility status PT Visit Diagnosis: Pain;Difficulty in walking, not elsewhere classified (R26.2);Muscle weakness (generalized) (M62.81) Pain - Right/Left: Right Pain - part of body: Knee     Time: 6384-5364 PT Time Calculation (min) (ACUTE ONLY): 31 min  Charges:  $Gait Training: 8-22 mins $Therapeutic Exercise: 8-22 mins                    G Codes:       Leighton Roach, PT  Acute Rehab Services  [place] 04/19/2018, 12:32 PM

## 2018-04-19 NOTE — Progress Notes (Signed)
Patient ID: Bethany [name], female   DOB: July 19, 1956, 62 y.o.   MRN: [id number]       Subjective: Pt feels a little better, but still feels bloated.  Has a small amount of flatus and a small BM yesterday.  Objective: Vital signs in last 24 hours: Temp:  [98.1 F (36.7 C)-98.3 F (36.8 C)] 98.3 F (36.8 C) (06/18 0541) Pulse Rate:  [78-87] 79 (06/18 0541) Resp:  [16-17] 16 (06/18 0541) BP: (131-143)/(68-71) 131/71 (06/18 0541) SpO2:  [99 %-100 %] 99 % (06/18 0541) Last BM Date: 04/18/18  Intake/Output from previous day: 06/17 0701 - 06/18 0700 In: 880 [I.V.:880] Out: 1200 [Emesis/NG output:1200] Intake/Output this shift: No intake/output data recorded.  PE: Heart: regular Lungs: CTAB Abd: soft, yet still distended and tympanitic.  Few BS.  NGT still has some bilious output.  About 1L yesterday.  Lab Results:  Recent Labs    04/18/18 0333 04/19/18 0613  WBC 5.4 5.7  HGB 10.7* 10.0*  HCT 32.6* 31.4*  PLT 203 218   BMET Recent Labs    04/18/18 0333 04/19/18 0613  NA 142 144  K 3.3* 3.3*  CL 101 105  CO2 29 27  GLUCOSE 81 84  BUN 16 21*  CREATININE 0.98 0.84  CALCIUM 8.7* 8.7*   PT/INR No results for input(s): LABPROT, INR in the last 72 hours. CMP     Component Value Date/Time   NA 144 04/19/2018 0613   K 3.3 (L) 04/19/2018 0613   CL 105 04/19/2018 0613   CO2 27 04/19/2018 0613   GLUCOSE 84 04/19/2018 0613   BUN 21 (H) 04/19/2018 0613   CREATININE 0.84 04/19/2018 0613   CREATININE 1.05 (H) 03/26/2017 0929   CALCIUM 8.7 (L) 04/19/2018 0613   PROT 6.8 04/17/2018 0353   ALBUMIN 3.6 04/17/2018 0353   AST 23 04/17/2018 0353   ALT 14 04/17/2018 0353   ALKPHOS 49 04/17/2018 0353   BILITOT 0.9 04/17/2018 0353   GFRNONAA >60 04/19/2018 0613   GFRAA >60 04/19/2018 0613   Lipase     Component Value Date/Time   LIPASE 26 04/17/2018 0353       Studies/Results: Dg Abd 1 View  Result Date: 04/19/2018 CLINICAL DATA:  Small bowel obstruction. EXAM:  ABDOMEN - 1 VIEW COMPARISON:  CT abdomen and pelvis April 17, 2018; abdominal radiograph April 18, 2018 FINDINGS: Nasogastric tube tip is in the gastric cardia region. The side-port is not seen but is felt to be above the diaphragm. There remain loops of dilated small bowel, similar to 1 day prior. No free air. There are phleboliths in the pelvis. IMPRESSION: Findings indicative of a degree of persistent bowel obstruction. No free air. Nasogastric tube tip in proximal stomach with side port above the diaphragm. Advise advancing nasogastric tube approximately 8 cm to insure that side-port as well as nasogastric tube tip are within the stomach. Electronically Signed   By: Lowella Grip III M.D.   On: 04/19/2018 07:55   Dg Abd Portable 1v-small Bowel Obstruction Protocol-24 Hr Delay  Result Date: 04/18/2018 CLINICAL DATA:  Small-bowel obstruction, 24 hour delay EXAM: PORTABLE ABDOMEN - 1 VIEW COMPARISON:  Portable exam 1635 hours compared to 04/18/2018 FINDINGS: Tip of orogastric tube projects over proximal stomach though the proximal side-port projects over the distal esophagus; recommend advancing tube 7 cm. Small amount gas within stomach. Persistent significant gaseous distention of small bowel loops consistent with small bowel obstruction, small bowel measuring up to 4.2 cm transverse slightly  less than the 4.6 cm on the prior exam. No colonic gas. Questionable mild wall thickening of a single small bowel loop in the LEFT upper quadrant. New osseous structures unremarkable. Lung bases clear. IMPRESSION: Persistent small bowel obstruction. Electronically Signed   By: Lavonia Dana M.D.   On: 04/18/2018 16:57   Dg Abd Portable 1v-small Bowel Obstruction Protocol-initial, 8 Hr Delay  Result Date: 04/18/2018 CLINICAL DATA:  Follow up small bowel obstruction. EXAM: PORTABLE ABDOMEN - 1 VIEW COMPARISON:  Abdominal radiograph April 17, 2018 FINDINGS: Persistent gas distended small bowel measuring to 4.2 cm.  Paucity of large bowel gas. Nasogastric tube tip projects at proximal stomach. Phleboliths project in the pelvis. Soft tissue and osseous structures non suspicious. IMPRESSION: Unchanged small bowel obstruction. Nasogastric tube tip projects in proximal stomach. Electronically Signed   By: Elon Alas M.D.   On: 04/18/2018 01:42   Dg Abd Portable 1v-small Bowel Protocol-position Verification  Result Date: 04/17/2018 CLINICAL DATA:  Confirm NG tube placement. EXAM: PORTABLE ABDOMEN - 1 VIEW COMPARISON:  04/17/2018. FINDINGS: Enteric tube is identified with tip 2.8 cm below the level of the GE junction. The side port is 5 cm above the GE junction. Persistent small bowel dilatation compatible with obstruction. IMPRESSION: The tip of the NG tube projects over the proximal stomach just below the level of the GE junction and the side port is above the GE junction. Advise further tube advancement such that the side port is at least below the GE junction. Electronically Signed   By: Kerby Moors M.D.   On: 04/17/2018 10:50    Anti-infectives: Anti-infectives (From admission, onward)   None       Assessment/Plan SBO - S/P total right knee on 6/11 - CT showed moderate to high-grade partial small bowel obstruction - film today is essentially stable.  May be some slight increase in air in her colon, but still with small bowel dilatation as well as tympany on exam.   - if she doesn't progress much by tomorrow, then she will likely require an operation. -cont NGT and repeat films in am  FEN: NGT to LIWS, NPO VTE; SCD's, lovenox ID: none  Follow up: TBD   LOS: 2 days    Henreitta Cea , [facility] Surgery 04/19/2018, 9:06 AM Pager: [phone]

## 2018-04-20 ENCOUNTER — Inpatient Hospital Stay (HOSPITAL_COMMUNITY): Payer: 59

## 2018-04-20 LAB — CBC
HCT: 30.8 % — ABNORMAL LOW (ref 36.0–46.0)
HEMOGLOBIN: 9.9 g/dL — AB (ref 12.0–15.0)
MCH: 33.1 pg (ref 26.0–34.0)
MCHC: 32.1 g/dL (ref 30.0–36.0)
MCV: 103 fL — ABNORMAL HIGH (ref 78.0–100.0)
Platelets: 226 10*3/uL (ref 150–400)
RBC: 2.99 MIL/uL — AB (ref 3.87–5.11)
RDW: 12.5 % (ref 11.5–15.5)
WBC: 7.4 10*3/uL (ref 4.0–10.5)

## 2018-04-20 LAB — BASIC METABOLIC PANEL
ANION GAP: 5 (ref 5–15)
BUN: 20 mg/dL (ref 6–20)
CO2: 33 mmol/L — ABNORMAL HIGH (ref 22–32)
Calcium: 8.8 mg/dL — ABNORMAL LOW (ref 8.9–10.3)
Chloride: 110 mmol/L (ref 101–111)
Creatinine, Ser: 0.77 mg/dL (ref 0.44–1.00)
Glucose, Bld: 137 mg/dL — ABNORMAL HIGH (ref 65–99)
POTASSIUM: 3.1 mmol/L — AB (ref 3.5–5.1)
Sodium: 148 mmol/L — ABNORMAL HIGH (ref 135–145)

## 2018-04-20 LAB — FOLATE RBC
Folate, Hemolysate: 370.2 ng/mL
Folate, RBC: 1255 ng/mL (ref >498)
Hematocrit: 29.5 % — ABNORMAL LOW (ref 34.0–46.6)

## 2018-04-20 LAB — MAGNESIUM: MAGNESIUM: 1.8 mg/dL (ref 1.7–2.4)

## 2018-04-20 MED ORDER — POTASSIUM CHLORIDE 20 MEQ/15ML (10%) PO SOLN
40.0000 meq | Freq: Once | ORAL | Status: AC
  Administered 2018-04-20: 40 meq via ORAL
  Filled 2018-04-20: qty 30

## 2018-04-20 MED ORDER — MAGNESIUM SULFATE IN D5W 1-5 GM/100ML-% IV SOLN
1.0000 g | Freq: Once | INTRAVENOUS | Status: AC
  Administered 2018-04-20: 1 g via INTRAVENOUS
  Filled 2018-04-20: qty 100

## 2018-04-20 MED ORDER — POTASSIUM CHLORIDE 10 MEQ/100ML IV SOLN
10.0000 meq | INTRAVENOUS | Status: AC
  Administered 2018-04-20 (×5): 10 meq via INTRAVENOUS
  Filled 2018-04-20 (×5): qty 100

## 2018-04-20 NOTE — Progress Notes (Signed)
NG tube clamped for 6hrs.  Advanced 7cm per radiology recommendations.  LWIS resulted in 200 mL after one hour.  MD made aware.  Ordered to discontinue NG tube.  Tube removed with no complaints or complications.  Will continue to monitor

## 2018-04-20 NOTE — Progress Notes (Signed)
Occupational Therapy Treatment Patient Details Name: Bethany [name] MRN: [id number] DOB: 01/22/1956 Today's Date: 04/20/2018    History of present illness 62 year old female past medical history significant for osteoporosis with the right knee status post right total knee replacement on 04/12/2018 just discharged from our facility presents with worsening generalized abdominal pain.  She is found to have a partial small bowel obstruction likely related to inactivity and pain medications.   OT comments  Pt without memory of having pulled out her NGT last night. More quiet today, but appropriate. Limited session due to pt needing to remain on suction in preparation for clamping trial. Performed grooming and toileting with BSC.   Follow Up Recommendations  No OT follow up    Equipment Recommendations  None recommended by OT    Recommendations for Other Services      Precautions / Restrictions Precautions Precautions: Fall;Knee Restrictions RLE Weight Bearing: Weight bearing as tolerated Other Position/Activity Restrictions: NG tube       Mobility Bed Mobility Overal bed mobility: Modified Independent                Transfers Overall transfer level: Needs assistance Equipment used: Rolling walker (2 wheeled) Transfers: Sit to/from [employer] Transfers Sit to Stand: Modified independent (Device/Increase time) Stand pivot transfers: Supervision            Balance     Sitting balance-Leahy Scale: Good       Standing balance-Leahy Scale: Fair Standing balance comment: can release walker in static standing                           ADL either performed or assessed with clinical judgement   ADL Overall ADL's : Needs assistance/impaired     Grooming: Oral care;Wash/dry hands;Wash/dry face;Sitting                   Toilet Transfer: Supervision/safety;Stand-pivot;BSC;RW   Toileting- Water quality scientist and Hygiene:  Supervision/safety;Sit to/from stand         General ADL Comments: limited session, husband wanting to keep pt on suction until RN clamps for trial, reports pt had a shower yesterday with assist of husband     Vision       Perception     Praxis      Cognition Arousal/Alertness: Awake/alert Behavior During Therapy: Flat affect Overall Cognitive Status: Within Functional Limits for tasks assessed                                 General Comments: less talkative today, does not remember events of last night when she pulled out her NG tube and attempted to get up         Exercises     Shoulder Instructions       General Comments      Pertinent Vitals/ Pain       Pain Assessment: Faces Faces Pain Scale: Hurts a little bit Pain Location: R knee and abdomen Pain Descriptors / Indicators: Aching;Sore;Discomfort;Grimacing Pain Intervention(s): Monitored during session  Home Living                                          Prior Functioning/Environment              Frequency  Min  2X/week        Progress Toward Goals  OT Goals(current goals can now be found in the care plan section)  Progress towards OT goals: Progressing toward goals  Acute Rehab OT Goals Patient Stated Goal: return home OT Goal Formulation: With patient Time For Goal Achievement: 05/02/18 Potential to Achieve Goals: Good  Plan Discharge plan remains appropriate    Co-evaluation                 AM-PAC PT "6 Clicks" Daily Activity     Outcome Measure   Help from another person eating meals?: None Help from another person taking care of personal grooming?: A Little Help from another person toileting, which includes using toliet, bedpan, or urinal?: A Little Help from another person bathing (including washing, rinsing, drying)?: A Little Help from another person to put on and taking off regular upper body clothing?: None Help from another person  to put on and taking off regular lower body clothing?: A Little 6 Click Score: 20    End of Session Equipment Utilized During Treatment: Gait belt;Rolling walker  OT Visit Diagnosis: Other abnormalities of gait and mobility (R26.89);Pain   Activity Tolerance Patient tolerated treatment well   Patient Left in bed;with call bell/phone within reach;with family/visitor present   Nurse Communication Other (comment)(keep NG until clamped)        Time: 5909-3112 OT Time Calculation (min): 24 min  Charges: OT General Charges $OT Visit: 1 Visit OT Treatments $Self Care/Home Management : 23-37 mins  04/20/2018 Nestor Lewandowsky, OTR/L Pager: [phone]   Werner Lean Haze Boyden 04/20/2018, 9:35 AM

## 2018-04-20 NOTE — Progress Notes (Signed)
Patient confused last night and pulled out NG tube. Bethany [name] notified. NGT replaced.Pt tolerated well. Bed alarm set on bed as patient has made multiple attempts to get OOB without assist. She is unsteady on her feet and is a fall risk.

## 2018-04-20 NOTE — Progress Notes (Signed)
   Subjective/Chief Complaint: NAUSEA Having flatus and BM s  No abdominal pain  Feels much better  KUB unchanged from yesterday with the exception of more colonic gas    Objective: Vital signs in last 24 hours: Temp:  [97.5 F (36.4 C)-98.4 F (36.9 C)] 98.4 F (36.9 C) (06/19 0449) Pulse Rate:  [65-75] 65 (06/19 0449) Resp:  [17-18] 17 (06/19 0449) BP: (129-141)/(67-72) 139/67 (06/19 0449) SpO2:  [99 %-100 %] 99 % (06/19 0449) Weight:  [55.2 kg (121 lb 11.1 oz)] 55.2 kg (121 lb 11.1 oz) (06/19 0500) Last BM Date: 04/18/18  Intake/Output from previous day: 06/18 0701 - 06/19 0700 In: 1290.5 [I.V.:1290.5] Out: 1600 [Emesis/NG output:1600] Intake/Output this shift: No intake/output data recorded.  GI: ND NT soft flat BS present   CTA  RRR  Lab Results:  Recent Labs    04/19/18 0613 04/20/18 0605  WBC 5.7 7.4  HGB 10.0* 9.9*  HCT 31.4* 30.8*  PLT 218 226   BMET Recent Labs    04/19/18 0613 04/20/18 0605  NA 144 148*  K 3.3* 3.1*  CL 105 110  CO2 27 33*  GLUCOSE 84 137*  BUN 21* 20  CREATININE 0.84 0.77  CALCIUM 8.7* 8.8*   PT/INR No results for input(s): LABPROT, INR in the last 72 hours. ABG No results for input(s): PHART, HCO3 in the last 72 hours.  Invalid input(s): PCO2, PO2  Studies/Results: Dg Abd 1 View  Result Date: 04/19/2018 CLINICAL DATA:  Small bowel obstruction. EXAM: ABDOMEN - 1 VIEW COMPARISON:  CT abdomen and pelvis April 17, 2018; abdominal radiograph April 18, 2018 FINDINGS: Nasogastric tube tip is in the gastric cardia region. The side-port is not seen but is felt to be above the diaphragm. There remain loops of dilated small bowel, similar to 1 day prior. No free air. There are phleboliths in the pelvis. IMPRESSION: Findings indicative of a degree of persistent bowel obstruction. No free air. Nasogastric tube tip in proximal stomach with side port above the diaphragm. Advise advancing nasogastric tube approximately 8 cm to insure  that side-port as well as nasogastric tube tip are within the stomach. Electronically Signed   By: Lowella Grip III M.D.   On: 04/19/2018 07:55   Dg Abd Portable 1v-small Bowel Obstruction Protocol-24 Hr Delay  Result Date: 04/18/2018 CLINICAL DATA:  Small-bowel obstruction, 24 hour delay EXAM: PORTABLE ABDOMEN - 1 VIEW COMPARISON:  Portable exam 1635 hours compared to 04/18/2018 FINDINGS: Tip of orogastric tube projects over proximal stomach though the proximal side-port projects over the distal esophagus; recommend advancing tube 7 cm. Small amount gas within stomach. Persistent significant gaseous distention of small bowel loops consistent with small bowel obstruction, small bowel measuring up to 4.2 cm transverse slightly less than the 4.6 cm on the prior exam. No colonic gas. Questionable mild wall thickening of a single small bowel loop in the LEFT upper quadrant. New osseous structures unremarkable. Lung bases clear. IMPRESSION: Persistent small bowel obstruction. Electronically Signed   By: Lavonia Dana M.D.   On: 04/18/2018 16:57    Anti-infectives: Anti-infectives (From admission, onward)   None      Assessment/Plan: SBO- clinically improved but KUB unchanged  Pt resistant to surgery since she feels better  Clamp NGT and remove later if she tolerates this  If she fails, ex lap Thursday  Ambulate to halls    LOS: 3 days    Bethany [name] 04/20/2018

## 2018-04-20 NOTE — Progress Notes (Signed)
PROGRESS NOTE  Bethany [name] OMV:[id number] DOB: [birth year] DOA: 04/17/2018 PCP: Bethany Lung, MD  HPI/Recap of past 24 hours: Bethany [name] is a 62 year old female with past medical history significant for osteoarthritis of the right knee status post right total knee replacement on 04/12/2018 who presented with worsening generalized abdominal pain.  CT abdomen pelvis with contrast done on 04/17/2018 revealed moderate to high-grade partial small bowel obstruction.  Prior hysterectomy noted on imaging.  Recently on opiates however stated that she was taking laxative and stool softener along with her pain medications.  History of tubular adenoma of colon one year ago.  Admitted for partial small bowel obstruction.  General surgery consulted and following.  Subjective: -He denies any abdomen pain, no further nausea or vomiting after NGT insertion, she reports bowel movement x1 yesterday, soft  Assessment/Plan: Principal Problem:   SBO (small bowel obstruction) (HCC) Active Problems:   Raynaud's disease   Recurrent herpes labialis   Mood disorder (HCC)   Tubular adenoma of colon   Primary osteoarthritis of right knee   IBS (irritable bowel syndrome)  Partial small bowel obstruction. -Medically appears to be improving, as no further nausea or vomiting, moving her bowels, but her imaging with no significant changes, general surgery input appreciated, will have trial of NGT clamping today and then reassess need for surgery once her output is known, continue with IV fluids, will need to be addressed multiple electrolytes abnormalities as it might be contributing to her ileus/obstruction, repleting magnesium, potassium, and she has hyponatremia . -Discussed with neurosurgery, if no improvement likely will need Exp laparotomy tomorrow.  Status post right total knee replacement,  - appears stable , continue with as needed pain medication, continue with PT and ambulation Weightbearing as tolerated  as recommended by orthopedic surgery The pelvic surgery outpatient 2 weeks postop Orthopedic surgery following.  Highly appreciated Pain management with IV Toradol due to partial small bowel obstruction  Hyperlipidemia Continue to hold statin due to n.p.o.  Persistent hypokalemia -3.1 today, will replete, I will give 1 g of mag sulfate as well.  Hypernatremia - cont with hypotonic fluid  Chronic macrocytic anemia - Hemoglobin 10.7; baseline hemoglobin 12 - MCV 101.9, b12 WnL follow on folic acid  Ambulatory dysfunction due to recent R knee surgery - Continue PT as recommended by orthopedic surgery   Code Status: Full code  Family Communication: Husband at bedside  Disposition Plan: Ambulating well , home once stable  Consultants:  Surgery  PT  Procedures:  None  Antimicrobials:  None  DVT prophylaxis: Subcu Lovenox   Objective: Vitals:   04/19/18 2146 04/20/18 0449 04/20/18 0500 04/20/18 1341  BP: (!) 141/72 139/67  128/73  Pulse: 74 65  63  Resp:  17    Temp: (!) 97.5 F (36.4 C) 98.4 F (36.9 C)  98.1 F (36.7 C)  TempSrc: Oral Oral  Oral  SpO2: 100% 99%  100%  Weight:   55.2 kg (121 lb 11.1 oz)   Height:        Intake/Output Summary (Last 24 hours) at 04/20/2018 1355 Last data filed at 04/20/2018 0700 Gross per 24 hour  Intake 1290.51 ml  Output 1600 ml  Net -309.49 ml   Filed Weights   04/17/18 1228 04/20/18 0500  Weight: 55 kg (121 lb 4 oz) 55.2 kg (121 lb 11.1 oz)    Exam:  Awake Alert, Oriented X 3, No new F.N deficits, Normal affect Symmetrical Chest wall movement, Good air movement bilaterally,  CTAB RRR,No Gallops,Rubs or new Murmurs, No Parasternal Heave +ve B.Sounds, Abd Soft, No tenderness,  No rebound - guarding or rigidity. No Cyanosis, Clubbing or edema, No new Rash or bruise      Data Reviewed: CBC: Recent Labs  Lab 04/17/18 0353 04/18/18 0333 04/19/18 0613 04/20/18 0605  WBC 5.5 5.4 5.7 7.4  HGB 12.3 10.7*  10.0* 9.9*  HCT 38.0 32.6* 31.4* 30.8*  MCV 103.5* 101.9* 103.3* 103.0*  PLT 216 203 218 157   Basic Metabolic Panel: Recent Labs  Lab 04/17/18 0353 04/18/18 0333 04/19/18 0613 04/20/18 0605  NA 139 142 144 148*  K 3.4* 3.3* 3.3* 3.1*  CL 93* 101 105 110  CO2 35* 29 27 33*  GLUCOSE 137* 81 84 137*  BUN 10 16 21* 20  CREATININE 0.92 0.98 0.84 0.77  CALCIUM 9.2 8.7* 8.7* 8.8*  MG  --   --   --  1.8   GFR: Estimated Creatinine Clearance: 63.5 mL/min (by C-G formula based on SCr of 0.77 mg/dL). Liver Function Tests: Recent Labs  Lab 04/17/18 0353  AST 23  ALT 14  ALKPHOS 49  BILITOT 0.9  PROT 6.8  ALBUMIN 3.6   Recent Labs  Lab 04/17/18 0353  LIPASE 26   No results for input(s): AMMONIA in the last 168 hours. Coagulation Profile: No results for input(s): INR, PROTIME in the last 168 hours. Cardiac Enzymes: No results for input(s): CKTOTAL, CKMB, CKMBINDEX, TROPONINI in the last 168 hours. BNP (last 3 results) No results for input(s): PROBNP in the last 8760 hours. HbA1C: No results for input(s): HGBA1C in the last 72 hours. CBG: No results for input(s): GLUCAP in the last 168 hours. Lipid Profile: No results for input(s): CHOL, HDL, LDLCALC, TRIG, CHOLHDL, LDLDIRECT in the last 72 hours. Thyroid Function Tests: No results for input(s): TSH, T4TOTAL, FREET4, T3FREE, THYROIDAB in the last 72 hours. Anemia Panel: Recent Labs    04/19/18 0613  VITAMINB12 530   Urine analysis:    Component Value Date/Time   COLORURINE YELLOW 04/17/2018 0350   APPEARANCEUR HAZY (A) 04/17/2018 0350   LABSPEC 1.024 04/17/2018 0350   LABSPEC 1.030 08/04/2017 0920   PHURINE 5.0 04/17/2018 0350   GLUCOSEU NEGATIVE 04/17/2018 0350   HGBUR NEGATIVE 04/17/2018 0350   BILIRUBINUR NEGATIVE 04/17/2018 0350   BILIRUBINUR negative 08/04/2017 0920   BILIRUBINUR n 09/16/2016 1559   KETONESUR 20 (A) 04/17/2018 0350   PROTEINUR 30 (A) 04/17/2018 0350   UROBILINOGEN 0.2 09/16/2016 1559    UROBILINOGEN 2 (H) 05/23/2015 1252   NITRITE NEGATIVE 04/17/2018 0350   LEUKOCYTESUR NEGATIVE 04/17/2018 0350   Sepsis Labs: @LABRCNTIP (procalcitonin:4,lacticidven:4)  )No results found for this or any previous visit (from the past 240 hour(s)).    Studies: Dg Abd 1 View  Result Date: 04/20/2018 CLINICAL DATA:  Evaluate for small bowel obstruction and NG tube placement EXAM: ABDOMEN - 1 VIEW COMPARISON:  04/19/2018 FINDINGS: NG tube tip is in the distal esophagus with the side port in the mid esophagus. This should be advanced several cm. Small bowel dilatation noted in the abdomen compatible with small bowel obstruction as seen on prior study. Bibasilar atelectasis. IMPRESSION: NG tube tip in the distal esophagus. Recommend advancing several cm. Small bowel obstruction pattern, stable. Electronically Signed   By: Rolm Baptise M.D.   On: 04/20/2018 08:16    Scheduled Meds: . enoxaparin (LOVENOX) injection  40 mg Subcutaneous Q24H  . mouth rinse  15 mL Mouth Rinse BID  Continuous Infusions: . sodium chloride Stopped (04/19/18 1502)  . dextrose 5% lactated ringers with KCl 20 mEq/L 75 mL/hr at 04/20/18 1310  . magnesium sulfate 1 - 4 g bolus IVPB    . potassium chloride 10 mEq (04/20/18 1316)     LOS: 3 days     Phillips Climes, MD [facility] Pager [phone] If 7PM-7AM, please contact night-coverage [email or web address] Password [email or web address] 04/20/2018, 1:55 PM

## 2018-04-21 ENCOUNTER — Inpatient Hospital Stay (HOSPITAL_COMMUNITY): Payer: 59

## 2018-04-21 LAB — CBC
HEMATOCRIT: 34 % — AB (ref 36.0–46.0)
HEMOGLOBIN: 10.8 g/dL — AB (ref 12.0–15.0)
MCH: 33.1 pg (ref 26.0–34.0)
MCHC: 31.8 g/dL (ref 30.0–36.0)
MCV: 104.3 fL — ABNORMAL HIGH (ref 78.0–100.0)
Platelets: 273 10*3/uL (ref 150–400)
RBC: 3.26 MIL/uL — AB (ref 3.87–5.11)
RDW: 12.5 % (ref 11.5–15.5)
WBC: 8.5 10*3/uL (ref 4.0–10.5)

## 2018-04-21 LAB — BASIC METABOLIC PANEL
Anion gap: 4 — ABNORMAL LOW (ref 5–15)
BUN: 14 mg/dL (ref 6–20)
CHLORIDE: 111 mmol/L (ref 101–111)
CO2: 30 mmol/L (ref 22–32)
CREATININE: 0.67 mg/dL (ref 0.44–1.00)
Calcium: 8.6 mg/dL — ABNORMAL LOW (ref 8.9–10.3)
GFR calc Af Amer: >60 mL/min (ref >60)
GFR calc non Af Amer: >60 mL/min (ref >60)
Glucose, Bld: 109 mg/dL — ABNORMAL HIGH (ref 65–99)
POTASSIUM: 3.8 mmol/L (ref 3.5–5.1)
Sodium: 145 mmol/L (ref 135–145)

## 2018-04-21 LAB — MAGNESIUM: Magnesium: 1.8 mg/dL (ref 1.7–2.4)

## 2018-04-21 MED ORDER — MAGNESIUM SULFATE IN D5W 1-5 GM/100ML-% IV SOLN
1.0000 g | Freq: Once | INTRAVENOUS | Status: AC
  Administered 2018-04-21: 1 g via INTRAVENOUS
  Filled 2018-04-21: qty 100

## 2018-04-21 MED ORDER — POTASSIUM CHLORIDE CRYS ER 20 MEQ PO TBCR
20.0000 meq | EXTENDED_RELEASE_TABLET | Freq: Once | ORAL | Status: DC
  Filled 2018-04-21: qty 1

## 2018-04-21 NOTE — Progress Notes (Signed)
   Subjective/Chief Complaint: Abdominal pain  Patient's NG tube was removed yesterday afternoon.  She has had no nausea, vomiting, or abdominal pain overnight.  She has passed a small amount of flatus.  She has had a couple of bowel movements.  KUB this morning was reviewed which shows continued evidence of a partial small bowel obstruction.  She is hungry and thirsty and would like to try liquids today.  Objective: Vital signs in last 24 hours: Temp:  [98.1 F (36.7 C)-98.3 F (36.8 C)] 98.3 F (36.8 C) (06/20 0442) Pulse Rate:  [63-65] 65 (06/20 0442) Resp:  [17] 17 (06/20 0442) BP: (128-145)/(71-73) 137/71 (06/20 0442) SpO2:  [96 %-100 %] 96 % (06/20 0442) Last BM Date: 04/20/18  Intake/Output from previous day: 06/19 0701 - 06/20 0700 In: 2724.1 [I.V.:2595.5; IV Piggyback:128.6] Out: 70 [Emesis/NG output:70] Intake/Output this shift: No intake/output data recorded.  GI: Mild distention.  Soft.  No rebound or guarding.  Lab Results:  Recent Labs    04/20/18 0605 04/21/18 0654  WBC 7.4 8.5  HGB 9.9* 10.8*  HCT 30.8* 34.0*  PLT 226 273   BMET Recent Labs    04/20/18 0605 04/21/18 0654  NA 148* 145  K 3.1* 3.8  CL 110 111  CO2 33* 30  GLUCOSE 137* 109*  BUN 20 14  CREATININE 0.77 0.67  CALCIUM 8.8* 8.6*   PT/INR No results for input(s): LABPROT, INR in the last 72 hours. ABG No results for input(s): PHART, HCO3 in the last 72 hours.  Invalid input(s): PCO2, PO2  Studies/Results: Dg Abd 1 View  Result Date: 04/20/2018 CLINICAL DATA:  Evaluate for small bowel obstruction and NG tube placement EXAM: ABDOMEN - 1 VIEW COMPARISON:  04/19/2018 FINDINGS: NG tube tip is in the distal esophagus with the side port in the mid esophagus. This should be advanced several cm. Small bowel dilatation noted in the abdomen compatible with small bowel obstruction as seen on prior study. Bibasilar atelectasis. IMPRESSION: NG tube tip in the distal esophagus. Recommend  advancing several cm. Small bowel obstruction pattern, stable. Electronically Signed   By: Rolm Baptise M.D.   On: 04/20/2018 08:16    Anti-infectives: Anti-infectives (From admission, onward)   None      Assessment/Plan: Small bowel obstruction-patient tolerated clamping and NG tube removal overnight.  We will try clear liquids today.  Her films are not improved at this point in time so there is risk she may fail require laparotomy.  I told her she does not tolerate her diet she will need laparotomy.  Leave her on clears for today and go slowly.  Ambulate to the halls.  If she develops nausea or vomiting her NG tube will be need to be replaced and laparotomy will be the next step for her.  This most likely represents a high-grade partial small bowel obstruction since she does have some GI function and some contrast in her colon.   LOS: 4 days    Joyice Faster Chalisa Kobler 04/21/2018

## 2018-04-21 NOTE — Progress Notes (Signed)
Notified Saverio Danker, PA that patient is more distended this afternoon. Will continue to encourage ambulation and gas chair.

## 2018-04-21 NOTE — Progress Notes (Signed)
PROGRESS NOTE                                                                                                                                                                                                             Patient Demographics:    Bethany [name], is a 62 y.o. female, DOB - [birth year], HMC:[id number]  Admit date - 04/17/2018   Admitting Physician Lady Deutscher, MD  Outpatient Primary MD for the patient is Denita Lung, MD  LOS - 4   Chief Complaint  Patient presents with  . Constipation  . Abdominal Pain       Brief Narrative   Bethany [name] is a 62 year old female with past medical history significant for osteoarthritis of the right knee status post right total knee replacement on 04/12/2018 who presented with worsening generalized abdominal pain.  CT abdomen pelvis with contrast done on 04/17/2018 revealed moderate to high-grade partial small bowel obstruction.  Prior hysterectomy noted on imaging.  Recently on opiates however stated that she was taking laxative and stool softener along with her pain medications.  History of tubular adenoma of colon one year ago.  Admitted for partial small bowel obstruction.  General surgery consulted and following.   Subjective:    Mashell Sieben today has, No headache, No chest pain, No abdominal pain -NGT was discontinued overnight as she only had 70 cc output after 6 hours clamping , reports she had small bowel movement and flatus overnight .    Assessment  & Plan :    Principal Problem:   SBO (small bowel obstruction) (HCC) Active Problems:   Raynaud's disease   Recurrent herpes labialis   Mood disorder (HCC)   Tubular adenoma of colon   Primary osteoarthritis of right knee   IBS (irritable bowel syndrome)    Partial small bowel obstruction. -requiredNGT insertion upon admission, she had no nausea or vomiting with low NGT output, it was discontinued overnight, unfortunately KUB this  a.m. still showing persistent obstruction, patient on clear liquid diet, she started to report some bloating and burping, will monitor closely, if she started having nausea or vomiting then she will need NGT reinserted . -General surgery input greatly appreciated, likely will need exploratory laparotomy if she has recurrence of symptoms .-Minimize pain medication, and ambulate . -Replete magnesium potassium   Status post right  total knee replacement  - appears stable , continue with as needed pain medication, continue with PT and ambulation Weightbearing as tolerated as recommended by orthopedic surgery Pain management with IV Toradol due to partial small bowel obstruction  Hyperlipidemia - Continue to hold statin due to n.p.o.  Persistent hypokalemia - repleted, it is within normal limits today, repeating magnesium as well  Hypernatremia -Sodium is 145 today, continue with IV fluids  Chronic macrocytic anemia - Hemoglobin 10.7; baseline hemoglobin 12 - MCV 101.9, b12 WnL follow on folic acid  Ambulatory dysfunction due to recent R knee surgery - Continue PT as recommended by orthopedic surgery     Code Status : Full  Family Communication  : Husband at bedside  Disposition Plan  : Home when stable  Consults  :  General surgery  Procedures  : none  DVT Prophylaxis  :  Kibler lovenox  Lab Results  Component Value Date   PLT 273 04/21/2018    Antibiotics  :    Anti-infectives (From admission, onward)   None        Objective:   Vitals:   04/20/18 1341 04/20/18 2134 04/21/18 0442 04/21/18 1253  BP: 128/73 (!) 145/72 137/71 (!) 148/74  Pulse: 63 65 65 64  Resp:   17   Temp: 98.1 F (36.7 C) 98.3 F (36.8 C) 98.3 F (36.8 C) 97.8 F (36.6 C)  TempSrc: Oral Oral Oral Oral  SpO2: 100% 98% 96% 100%  Weight:      Height:        Wt Readings from Last 3 Encounters:  04/20/18 55.2 kg (121 lb 11.1 oz)  04/12/18 55.1 kg (121 lb 6.4 oz)  03/31/18 55.1 kg  (121 lb 6.4 oz)     Intake/Output Summary (Last 24 hours) at 04/21/2018 1339 Last data filed at 04/21/2018 1020 Gross per 24 hour  Intake 3369.08 ml  Output 70 ml  Net 3299.08 ml     Physical Exam  Awake Alert, Oriented X 3, No new F.N deficits, Normal affect Symmetrical Chest wall movement, Good air movement bilaterally, CTAB RRR,No Gallops,Rubs or new Murmurs, No Parasternal Heave +ve B.Sounds, Abd Soft, No tenderness,  No rebound - guarding or rigidity. No Cyanosis, Clubbing ,No new Rash or bruise , mild edema right lower extremity    Data Review:    CBC Recent Labs  Lab 04/17/18 0353 04/18/18 0333 04/19/18 0613 04/20/18 0605 04/21/18 0654  WBC 5.5 5.4 5.7 7.4 8.5  HGB 12.3 10.7* 10.0* 9.9* 10.8*  HCT 38.0 32.6* 31.4*  29.5* 30.8* 34.0*  PLT 216 203 218 226 273  MCV 103.5* 101.9* 103.3* 103.0* 104.3*  MCH 33.5 33.4 32.9 33.1 33.1  MCHC 32.4 32.8 31.8 32.1 31.8  RDW 12.2 12.1 12.3 12.5 12.5    Chemistries  Recent Labs  Lab 04/17/18 0353 04/18/18 0333 04/19/18 0613 04/20/18 0605 04/21/18 0654  NA 139 142 144 148* 145  K 3.4* 3.3* 3.3* 3.1* 3.8  CL 93* 101 105 110 111  CO2 35* 29 27 33* 30  GLUCOSE 137* 81 84 137* 109*  BUN 10 16 21* 20 14  CREATININE 0.92 0.98 0.84 0.77 0.67  CALCIUM 9.2 8.7* 8.7* 8.8* 8.6*  MG  --   --   --  1.8 1.8  AST 23  --   --   --   --   ALT 14  --   --   --   --   ALKPHOS 49  --   --   --   --  BILITOT 0.9  --   --   --   --    ------------------------------------------------------------------------------------------------------------------ No results for input(s): CHOL, HDL, LDLCALC, TRIG, CHOLHDL, LDLDIRECT in the last 72 hours.  No results found for: HGBA1C ------------------------------------------------------------------------------------------------------------------ No results for input(s): TSH, T4TOTAL, T3FREE, THYROIDAB in the last 72 hours.  Invalid input(s):  FREET3 ------------------------------------------------------------------------------------------------------------------ Recent Labs    04/19/18 0613  VITAMINB12 530    Coagulation profile No results for input(s): INR, PROTIME in the last 168 hours.  No results for input(s): DDIMER in the last 72 hours.  Cardiac Enzymes No results for input(s): CKMB, TROPONINI, MYOGLOBIN in the last 168 hours.  Invalid input(s): CK ------------------------------------------------------------------------------------------------------------------ No results found for: BNP  Inpatient Medications  Scheduled Meds: . enoxaparin (LOVENOX) injection  40 mg Subcutaneous Q24H  . mouth rinse  15 mL Mouth Rinse BID  . potassium chloride  20 mEq Oral Once   Continuous Infusions: . dextrose 5% lactated ringers with KCl 20 mEq/L 75 mL/hr at 04/21/18 1030  . magnesium sulfate 1 - 4 g bolus IVPB     PRN Meds:.acetaminophen **OR** acetaminophen, bisacodyl, fentaNYL (SUBLIMAZE) injection, ketorolac, LORazepam, ondansetron **OR** ondansetron (ZOFRAN) IV, phenol, sodium phosphate  Micro Results No results found for this or any previous visit (from the past 240 hour(s)).  Radiology Reports Dg Chest 2 View  Result Date: 04/01/2018 CLINICAL DATA:  Preoperative for right total knee arthroplasty. EXAM: CHEST - 2 VIEW COMPARISON:  None. FINDINGS: Normal heart size. Normal mediastinal contour. No pneumothorax. No pleural effusion. Lungs appear clear, with no acute consolidative airspace disease and no pulmonary edema. IMPRESSION: No active cardiopulmonary disease. Electronically Signed   By: Ilona Sorrel M.D.   On: 04/01/2018 08:15   Dg Abd 1 View  Result Date: 04/21/2018 CLINICAL DATA:  Small bowel obstruction. EXAM: ABDOMEN - 1 VIEW COMPARISON:  Radiographs of April 20, 2018. FINDINGS: Stable small bowel dilatation is noted concerning for distal small bowel obstruction. No colonic dilatation is noted. Phleboliths  are noted in the pelvis. IMPRESSION: Stable small bowel dilatation concerning for distal small bowel obstruction. Electronically Signed   By: Marijo Conception, M.D.   On: 04/21/2018 08:38   Dg Abd 1 View  Result Date: 04/20/2018 CLINICAL DATA:  Evaluate for small bowel obstruction and NG tube placement EXAM: ABDOMEN - 1 VIEW COMPARISON:  04/19/2018 FINDINGS: NG tube tip is in the distal esophagus with the side port in the mid esophagus. This should be advanced several cm. Small bowel dilatation noted in the abdomen compatible with small bowel obstruction as seen on prior study. Bibasilar atelectasis. IMPRESSION: NG tube tip in the distal esophagus. Recommend advancing several cm. Small bowel obstruction pattern, stable. Electronically Signed   By: Rolm Baptise M.D.   On: 04/20/2018 08:16   Dg Abd 1 View  Result Date: 04/19/2018 CLINICAL DATA:  Small bowel obstruction. EXAM: ABDOMEN - 1 VIEW COMPARISON:  CT abdomen and pelvis April 17, 2018; abdominal radiograph April 18, 2018 FINDINGS: Nasogastric tube tip is in the gastric cardia region. The side-port is not seen but is felt to be above the diaphragm. There remain loops of dilated small bowel, similar to 1 day prior. No free air. There are phleboliths in the pelvis. IMPRESSION: Findings indicative of a degree of persistent bowel obstruction. No free air. Nasogastric tube tip in proximal stomach with side port above the diaphragm. Advise advancing nasogastric tube approximately 8 cm to insure that side-port as well as nasogastric tube tip are within the stomach.  Electronically Signed   By: Lowella Grip III M.D.   On: 04/19/2018 07:55   Ct Abdomen Pelvis W Contrast  Result Date: 04/17/2018 CLINICAL DATA:  Constipation, abdominal pain, vomiting EXAM: CT ABDOMEN AND PELVIS WITH CONTRAST TECHNIQUE: Multidetector CT imaging of the abdomen and pelvis was performed using the standard protocol following bolus administration of intravenous contrast. CONTRAST:   139mL OMNIPAQUE IOHEXOL 300 MG/ML  SOLN COMPARISON:  None. FINDINGS: Lower chest: Bilateral breast implants partially imaged. Linear atelectasis or scarring at the right base. No effusions. Hepatobiliary: No focal hepatic abnormality. Gallbladder unremarkable. Pancreas: No focal abnormality or ductal dilatation. Spleen: No focal abnormality.  Normal size. Adrenals/Urinary Tract: No adrenal abnormality. No focal renal abnormality. No stones or hydronephrosis. Urinary bladder is unremarkable. Stomach/Bowel: Dilated fluid-filled small bowel loops with air-fluid levels noted throughout the abdomen and pelvis. Distal small bowel loops appear decompressed. Exact transition is not visualize, but findings compatible with moderate to high-grade partial small bowel obstruction. Large bowel decompressed, grossly unremarkable. Vascular/Lymphatic: No evidence of aneurysm or adenopathy. Reproductive: Prior hysterectomy.  No adnexal masses. Other: Small amount of free fluid in the pelvis.  No free air. Musculoskeletal: No acute bony abnormality. L5 related grade 1 anterolisthesis of to facet disease L4 on. Degenerative disc disease at L4-5. IMPRESSION: Findings compatible with moderate to high-grade partial small bowel obstruction. Exact transition and cause not visualized, presumably adhesions. Prior hysterectomy. Electronically Signed   By: Rolm Baptise M.D.   On: 04/17/2018 09:11   Dg Abd Portable 1v-small Bowel Obstruction Protocol-24 Hr Delay  Result Date: 04/18/2018 CLINICAL DATA:  Small-bowel obstruction, 24 hour delay EXAM: PORTABLE ABDOMEN - 1 VIEW COMPARISON:  Portable exam 1635 hours compared to 04/18/2018 FINDINGS: Tip of orogastric tube projects over proximal stomach though the proximal side-port projects over the distal esophagus; recommend advancing tube 7 cm. Small amount gas within stomach. Persistent significant gaseous distention of small bowel loops consistent with small bowel obstruction, small bowel  measuring up to 4.2 cm transverse slightly less than the 4.6 cm on the prior exam. No colonic gas. Questionable mild wall thickening of a single small bowel loop in the LEFT upper quadrant. New osseous structures unremarkable. Lung bases clear. IMPRESSION: Persistent small bowel obstruction. Electronically Signed   By: Lavonia Dana M.D.   On: 04/18/2018 16:57   Dg Abd Portable 1v-small Bowel Obstruction Protocol-initial, 8 Hr Delay  Result Date: 04/18/2018 CLINICAL DATA:  Follow up small bowel obstruction. EXAM: PORTABLE ABDOMEN - 1 VIEW COMPARISON:  Abdominal radiograph April 17, 2018 FINDINGS: Persistent gas distended small bowel measuring to 4.2 cm. Paucity of large bowel gas. Nasogastric tube tip projects at proximal stomach. Phleboliths project in the pelvis. Soft tissue and osseous structures non suspicious. IMPRESSION: Unchanged small bowel obstruction. Nasogastric tube tip projects in proximal stomach. Electronically Signed   By: Elon Alas M.D.   On: 04/18/2018 01:42   Dg Abd Portable 1v-small Bowel Protocol-position Verification  Result Date: 04/17/2018 CLINICAL DATA:  Confirm NG tube placement. EXAM: PORTABLE ABDOMEN - 1 VIEW COMPARISON:  04/17/2018. FINDINGS: Enteric tube is identified with tip 2.8 cm below the level of the GE junction. The side port is 5 cm above the GE junction. Persistent small bowel dilatation compatible with obstruction. IMPRESSION: The tip of the NG tube projects over the proximal stomach just below the level of the GE junction and the side port is above the GE junction. Advise further tube advancement such that the side port is at least below the GE  junction. Electronically Signed   By: Kerby Moors M.D.   On: 04/17/2018 10:50     Phillips Climes M.D on 04/21/2018 at 1:39 PM  Between 7am to 7pm - Pager - [phone]  After 7pm go to [email or web address] - password [facility]  [facility] -  Office  [phone]

## 2018-04-21 NOTE — Progress Notes (Signed)
Patient refused NGT insertion at this time.Patient stated that if she vomits again then will re insert NGT

## 2018-04-21 NOTE — Progress Notes (Signed)
Physical Therapy Treatment Patient Details Name: Bethany [name] MRN: [id number] DOB: 1956/06/10 Today's Date: 04/21/2018    History of Present Illness 62 year old female past medical history significant for osteoporosis with the right knee status post right total knee replacement on 04/12/2018 just discharged from our facility presents with worsening generalized abdominal pain.  She is found to have a partial small bowel obstruction likely related to inactivity and pain medications.    PT Comments    Pt progressing well.  Increased ambulation to 2 laps around unit today (~1000') with RW and mod I for mobility overall.  Will continue to benefit from acute PT for activity tolerance and LE strengthening/ROM s/p TKA.    Follow Up Recommendations  Home health PT;Supervision for mobility/OOB     Equipment Recommendations  None recommended by PT    Recommendations for Other Services       Precautions / Restrictions Precautions Precautions: Fall;Knee Restrictions Weight Bearing Restrictions: No RLE Weight Bearing: Weight bearing as tolerated    Mobility  Bed Mobility Overal bed mobility: Modified Independent                Transfers Overall transfer level: Modified independent Equipment used: Rolling walker (2 wheeled) Transfers: Sit to/from Stand Sit to Stand: Modified independent (Device/Increase time) Stand pivot transfers: Modified independent (Device/Increase time)          Ambulation/Gait Ambulation/Gait assistance: Modified independent (Device/Increase time) Gait Distance (Feet): 1000 Feet Assistive device: Rolling walker (2 wheeled) Gait Pattern/deviations: Step-through pattern;Decreased weight shift to right     General Gait Details: decreased R knee flexion in swing phase   Stairs             Wheelchair Mobility    Modified Rankin (Stroke Patients Only)       Balance                                             Cognition Arousal/Alertness: Awake/alert Behavior During Therapy: WFL for tasks assessed/performed Overall Cognitive Status: Within Functional Limits for tasks assessed                                        Exercises      General Comments        Pertinent Vitals/Pain Pain Assessment: 0-10 Pain Score: 7  Pain Location: R knee and abdomen Pain Descriptors / Indicators: Aching;Sore;Discomfort;Grimacing Pain Intervention(s): Monitored during session;Limited activity within patient's tolerance    Home Living                      Prior Function            PT Goals (current goals can now be found in the care plan section) Acute Rehab PT Goals Patient Stated Goal: return home PT Goal Formulation: With patient Time For Goal Achievement: 05/02/18 Potential to Achieve Goals: Good Progress towards PT goals: Progressing toward goals    Frequency    Min 4X/week      PT Plan Current plan remains appropriate    Co-evaluation              AM-PAC PT "6 Clicks" Daily Activity  Outcome Measure  Difficulty turning over in bed (including adjusting bedclothes, sheets and blankets)?: None Difficulty moving from  lying on back to sitting on the side of the bed? : None Difficulty sitting down on and standing up from a chair with arms (e.g., wheelchair, bedside commode, etc,.)?: None Help needed moving to and from a bed to chair (including a wheelchair)?: A Little Help needed walking in hospital room?: A Little Help needed climbing 3-5 steps with a railing? : A Little 6 Click Score: 21    End of Session Equipment Utilized During Treatment: Gait belt Activity Tolerance: Patient tolerated treatment well Patient left: in bed;with call bell/phone within reach Nurse Communication: Mobility status PT Visit Diagnosis: Pain;Difficulty in walking, not elsewhere classified (R26.2);Muscle weakness (generalized) (M62.81) Pain - Right/Left: Right Pain - part  of body: Knee     Time: 0940-1005 PT Time Calculation (min) (ACUTE ONLY): 25 min  Charges:  $Gait Training: 23-37 mins                    G Codes:          Michel Santee 04/21/2018, 10:10 AM

## 2018-04-22 ENCOUNTER — Encounter (HOSPITAL_COMMUNITY)
Admission: EM | Disposition: A | Discharge status: Home Health | Payer: Self-pay | Source: Home / Self Care | Attending: Internal Medicine

## 2018-04-22 ENCOUNTER — Inpatient Hospital Stay (HOSPITAL_COMMUNITY): Payer: 59 | Admitting: Certified Registered Nurse Anesthetist

## 2018-04-22 ENCOUNTER — Encounter (HOSPITAL_COMMUNITY): Payer: Self-pay | Admitting: *Deleted

## 2018-04-22 ENCOUNTER — Inpatient Hospital Stay (HOSPITAL_COMMUNITY): Payer: 59

## 2018-04-22 HISTORY — PX: LAPAROTOMY: SHX154

## 2018-04-22 LAB — BASIC METABOLIC PANEL
Anion gap: 6 (ref 5–15)
BUN: 13 mg/dL (ref 6–20)
CHLORIDE: 109 mmol/L (ref 101–111)
CO2: 29 mmol/L (ref 22–32)
Calcium: 8.8 mg/dL — ABNORMAL LOW (ref 8.9–10.3)
Creatinine, Ser: 0.84 mg/dL (ref 0.44–1.00)
GFR calc non Af Amer: >60 mL/min (ref >60)
Glucose, Bld: 102 mg/dL — ABNORMAL HIGH (ref 65–99)
Potassium: 3.9 mmol/L (ref 3.5–5.1)
SODIUM: 144 mmol/L (ref 135–145)

## 2018-04-22 LAB — TYPE AND SCREEN
ABO/RH(D): O POS
Antibody Screen: NEGATIVE

## 2018-04-22 LAB — MAGNESIUM: MAGNESIUM: 1.9 mg/dL (ref 1.7–2.4)

## 2018-04-22 LAB — CBC
HCT: 36.4 % (ref 36.0–46.0)
HEMOGLOBIN: 11.7 g/dL — AB (ref 12.0–15.0)
MCH: 33.8 pg (ref 26.0–34.0)
MCHC: 32.1 g/dL (ref 30.0–36.0)
MCV: 105.2 fL — AB (ref 78.0–100.0)
Platelets: 312 10*3/uL (ref 150–400)
RBC: 3.46 MIL/uL — AB (ref 3.87–5.11)
RDW: 12.6 % (ref 11.5–15.5)
WBC: 10.4 10*3/uL (ref 4.0–10.5)

## 2018-04-22 SURGERY — LAPAROTOMY, EXPLORATORY
Anesthesia: General | Site: Abdomen

## 2018-04-22 MED ORDER — PROMETHAZINE HCL 25 MG/ML IJ SOLN: 6.2500 mg | INTRAMUSCULAR | Status: DC | PRN

## 2018-04-22 MED ORDER — FENTANYL CITRATE (PF) 250 MCG/5ML IJ SOLN
INTRAMUSCULAR | Status: AC
  Filled 2018-04-22: qty 5

## 2018-04-22 MED ORDER — PHENYLEPHRINE 40 MCG/ML (10ML) SYRINGE FOR IV PUSH (FOR BLOOD PRESSURE SUPPORT)
PREFILLED_SYRINGE | INTRAVENOUS | Status: AC
  Filled 2018-04-22: qty 10

## 2018-04-22 MED ORDER — HYDROMORPHONE HCL 1 MG/ML IJ SOLN
0.2500 mg | INTRAMUSCULAR | Status: DC | PRN
  Administered 2018-04-22: 0.5 mg via INTRAVENOUS

## 2018-04-22 MED ORDER — MIDAZOLAM HCL 2 MG/2ML IJ SOLN
INTRAMUSCULAR | Status: AC
  Filled 2018-04-22: qty 2

## 2018-04-22 MED ORDER — KETOROLAC TROMETHAMINE 30 MG/ML IJ SOLN: 30.0000 mg | Freq: Once | INTRAMUSCULAR | Status: DC | PRN

## 2018-04-22 MED ORDER — ONDANSETRON HCL 4 MG/2ML IJ SOLN
INTRAMUSCULAR | Status: DC | PRN
  Administered 2018-04-22: 4 mg via INTRAVENOUS

## 2018-04-22 MED ORDER — PROPOFOL 10 MG/ML IV BOLUS
INTRAVENOUS | Status: DC | PRN
  Administered 2018-04-22: 120 mg via INTRAVENOUS

## 2018-04-22 MED ORDER — LIDOCAINE 2% (20 MG/ML) 5 ML SYRINGE
INTRAMUSCULAR | Status: DC | PRN
  Administered 2018-04-22: 60 mg via INTRAVENOUS

## 2018-04-22 MED ORDER — ROCURONIUM BROMIDE 100 MG/10ML IV SOLN
INTRAVENOUS | Status: DC | PRN
  Administered 2018-04-22: 50 mg via INTRAVENOUS

## 2018-04-22 MED ORDER — MIDAZOLAM HCL 5 MG/5ML IJ SOLN
INTRAMUSCULAR | Status: DC | PRN
  Administered 2018-04-22: 2 mg via INTRAVENOUS

## 2018-04-22 MED ORDER — SUCCINYLCHOLINE CHLORIDE 20 MG/ML IJ SOLN
INTRAMUSCULAR | Status: DC | PRN
  Administered 2018-04-22: 100 mg via INTRAVENOUS

## 2018-04-22 MED ORDER — DEXAMETHASONE SODIUM PHOSPHATE 10 MG/ML IJ SOLN
INTRAMUSCULAR | Status: DC | PRN
  Administered 2018-04-22: 10 mg via INTRAVENOUS

## 2018-04-22 MED ORDER — 0.9 % SODIUM CHLORIDE (POUR BTL) OPTIME
TOPICAL | Status: DC | PRN
  Administered 2018-04-22 (×3): 1000 mL

## 2018-04-22 MED ORDER — EPHEDRINE SULFATE 50 MG/ML IJ SOLN
INTRAMUSCULAR | Status: AC
  Filled 2018-04-22: qty 1

## 2018-04-22 MED ORDER — FENTANYL CITRATE (PF) 100 MCG/2ML IJ SOLN
25.0000 ug | INTRAMUSCULAR | Status: DC | PRN
  Administered 2018-04-22 – 2018-04-26 (×11): 50 ug via INTRAVENOUS
  Filled 2018-04-22 (×11): qty 2

## 2018-04-22 MED ORDER — CEFAZOLIN SODIUM 1 G IJ SOLR
INTRAMUSCULAR | Status: AC
  Filled 2018-04-22: qty 20

## 2018-04-22 MED ORDER — ENOXAPARIN SODIUM 40 MG/0.4ML ~~LOC~~ SOLN
40.0000 mg | SUBCUTANEOUS | Status: DC
  Administered 2018-04-23 – 2018-04-30 (×8): 40 mg via SUBCUTANEOUS
  Filled 2018-04-22 (×8): qty 0.4

## 2018-04-22 MED ORDER — ONDANSETRON HCL 4 MG/2ML IJ SOLN
INTRAMUSCULAR | Status: AC
  Filled 2018-04-22: qty 2

## 2018-04-22 MED ORDER — ARTIFICIAL TEARS OPHTHALMIC OINT
TOPICAL_OINTMENT | OPHTHALMIC | Status: AC
  Filled 2018-04-22: qty 3.5

## 2018-04-22 MED ORDER — CEFAZOLIN SODIUM-DEXTROSE 2-3 GM-%(50ML) IV SOLR
INTRAVENOUS | Status: DC | PRN
  Administered 2018-04-22: 2 g via INTRAVENOUS

## 2018-04-22 MED ORDER — HYDROMORPHONE HCL 1 MG/ML IJ SOLN
INTRAMUSCULAR | Status: AC
  Filled 2018-04-22: qty 1

## 2018-04-22 MED ORDER — SODIUM CHLORIDE 0.9 % IJ SOLN
INTRAMUSCULAR | Status: AC
  Filled 2018-04-22: qty 10

## 2018-04-22 MED ORDER — SUGAMMADEX SODIUM 200 MG/2ML IV SOLN
INTRAVENOUS | Status: AC
  Filled 2018-04-22: qty 2

## 2018-04-22 MED ORDER — OXYCODONE HCL 5 MG/5ML PO SOLN: 5.0000 mg | Freq: Once | ORAL | Status: DC | PRN

## 2018-04-22 MED ORDER — LACTATED RINGERS IV SOLN
INTRAVENOUS | Status: DC
  Administered 2018-04-22 (×2): via INTRAVENOUS

## 2018-04-22 MED ORDER — ROCURONIUM BROMIDE 50 MG/5ML IV SOLN
INTRAVENOUS | Status: AC
  Filled 2018-04-22: qty 1

## 2018-04-22 MED ORDER — SCOPOLAMINE 1 MG/3DAYS TD PT72
MEDICATED_PATCH | TRANSDERMAL | Status: DC | PRN
  Administered 2018-04-22: 1 via TRANSDERMAL

## 2018-04-22 MED ORDER — SCOPOLAMINE 1 MG/3DAYS TD PT72
MEDICATED_PATCH | TRANSDERMAL | Status: AC
  Filled 2018-04-22: qty 1

## 2018-04-22 MED ORDER — FENTANYL CITRATE (PF) 100 MCG/2ML IJ SOLN
INTRAMUSCULAR | Status: DC | PRN
  Administered 2018-04-22 (×4): 50 ug via INTRAVENOUS
  Administered 2018-04-22 (×2): 100 ug via INTRAVENOUS
  Administered 2018-04-22 (×2): 50 ug via INTRAVENOUS

## 2018-04-22 MED ORDER — OXYCODONE HCL 5 MG PO TABS: 5.0000 mg | ORAL_TABLET | Freq: Once | ORAL | Status: DC | PRN

## 2018-04-22 MED ORDER — PROPOFOL 10 MG/ML IV BOLUS
INTRAVENOUS | Status: AC
  Filled 2018-04-22: qty 20

## 2018-04-22 MED ORDER — LIDOCAINE 2% (20 MG/ML) 5 ML SYRINGE
INTRAMUSCULAR | Status: AC
  Filled 2018-04-22: qty 5

## 2018-04-22 MED ORDER — SUCCINYLCHOLINE CHLORIDE 20 MG/ML IJ SOLN
INTRAMUSCULAR | Status: AC
  Filled 2018-04-22: qty 1

## 2018-04-22 MED ORDER — ALBUMIN HUMAN 5 % IV SOLN
INTRAVENOUS | Status: DC | PRN
  Administered 2018-04-22 (×2): via INTRAVENOUS

## 2018-04-22 MED ORDER — DEXAMETHASONE SODIUM PHOSPHATE 10 MG/ML IJ SOLN
INTRAMUSCULAR | Status: AC
  Filled 2018-04-22: qty 1

## 2018-04-22 MED ORDER — SUGAMMADEX SODIUM 200 MG/2ML IV SOLN
INTRAVENOUS | Status: DC | PRN
  Administered 2018-04-22: 110 mg via INTRAVENOUS

## 2018-04-22 SURGICAL SUPPLY — 43 items
BLADE CLIPPER SURG (BLADE) IMPLANT
CANISTER SUCT 3000ML PPV (MISCELLANEOUS) ×2 IMPLANT
CHLORAPREP W/TINT 26ML (MISCELLANEOUS) ×2 IMPLANT
COVER SURGICAL LIGHT HANDLE (MISCELLANEOUS) ×2 IMPLANT
DRAPE LAPAROSCOPIC ABDOMINAL (DRAPES) ×2 IMPLANT
DRAPE WARM FLUID 44X44 (DRAPE) ×2 IMPLANT
DRSG OPSITE POSTOP 4X10 (GAUZE/BANDAGES/DRESSINGS) ×1 IMPLANT
DRSG OPSITE POSTOP 4X8 (GAUZE/BANDAGES/DRESSINGS) IMPLANT
ELECT BLADE 6.5 EXT (BLADE) ×1 IMPLANT
ELECT CAUTERY BLADE 6.4 (BLADE) ×2 IMPLANT
ELECT REM PT RETURN 9FT ADLT (ELECTROSURGICAL) ×2
ELECTRODE REM PT RTRN 9FT ADLT (ELECTROSURGICAL) ×1 IMPLANT
GLOVE BIO SURGEON STRL SZ8 (GLOVE) ×4 IMPLANT
GLOVE BIOGEL PI IND STRL 8 (GLOVE) ×1 IMPLANT
GLOVE BIOGEL PI INDICATOR 8 (GLOVE) ×2
GLOVE SURG SS PI 6.5 STRL IVOR (GLOVE) ×2 IMPLANT
GOWN STRL REUS W/ TWL LRG LVL3 (GOWN DISPOSABLE) ×1 IMPLANT
GOWN STRL REUS W/ TWL XL LVL3 (GOWN DISPOSABLE) ×1 IMPLANT
GOWN STRL REUS W/TWL LRG LVL3 (GOWN DISPOSABLE) ×4
GOWN STRL REUS W/TWL XL LVL3 (GOWN DISPOSABLE) ×2
KIT BASIN OR (CUSTOM PROCEDURE TRAY) ×2 IMPLANT
KIT TURNOVER KIT B (KITS) ×2 IMPLANT
LIGASURE IMPACT 36 18CM CVD LR (INSTRUMENTS) ×1 IMPLANT
NS IRRIG 1000ML POUR BTL (IV SOLUTION) ×4 IMPLANT
PACK GENERAL/GYN (CUSTOM PROCEDURE TRAY) ×2 IMPLANT
PAD ARMBOARD 7.5X6 YLW CONV (MISCELLANEOUS) ×2 IMPLANT
RELOAD PROXIMATE 75MM BLUE (ENDOMECHANICALS) ×4 IMPLANT
RELOAD STAPLE 75 3.8 BLU REG (ENDOMECHANICALS) IMPLANT
SPECIMEN JAR LARGE (MISCELLANEOUS) ×1 IMPLANT
SPONGE LAP 18X18 X RAY DECT (DISPOSABLE) ×1 IMPLANT
STAPLER GUN LINEAR PROX 60 (STAPLE) ×1 IMPLANT
STAPLER PROXIMATE 75MM BLUE (STAPLE) ×1 IMPLANT
STAPLER VISISTAT 35W (STAPLE) ×2 IMPLANT
SUCTION POOLE TIP (SUCTIONS) ×2 IMPLANT
SUT PDS AB 1 TP1 96 (SUTURE) ×4 IMPLANT
SUT VIC AB 2-0 SH 18 (SUTURE) ×2 IMPLANT
SUT VIC AB 3-0 SH 18 (SUTURE) ×2 IMPLANT
SUT VICRYL AB 2 0 TIES (SUTURE) ×2 IMPLANT
SUT VICRYL AB 3 0 TIES (SUTURE) ×2 IMPLANT
TOWEL OR 17X24 6PK STRL BLUE (TOWEL DISPOSABLE) ×2 IMPLANT
TOWEL OR 17X26 10 PK STRL BLUE (TOWEL DISPOSABLE) ×2 IMPLANT
TRAY FOLEY MTR SLVR 16FR STAT (SET/KITS/TRAYS/PACK) ×1 IMPLANT
YANKAUER SUCT BULB TIP NO VENT (SUCTIONS) ×1 IMPLANT

## 2018-04-22 NOTE — Anesthesia Procedure Notes (Signed)
Procedure Name: Intubation Date/Time: 04/22/2018 10:30 AM Performed by: Colin Benton, CRNA Pre-anesthesia Checklist: Patient identified, Emergency Drugs available, Suction available and Patient being monitored Patient Re-evaluated:Patient Re-evaluated prior to induction Oxygen Delivery Method: Circle system utilized Preoxygenation: Pre-oxygenation with 100% oxygen Induction Type: IV induction, Rapid sequence and Cricoid Pressure applied Laryngoscope Size: Miller and 2 Grade View: Grade I Tube type: Oral Tube size: 7.0 mm Number of attempts: 1 Airway Equipment and Method: Stylet Placement Confirmation: ETT inserted through vocal cords under direct vision,  positive ETCO2 and breath sounds checked- equal and bilateral Secured at: 21 cm Tube secured with: Tape Dental Injury: Teeth and Oropharynx as per pre-operative assessment

## 2018-04-22 NOTE — Progress Notes (Signed)
PT Cancellation Note  Patient Details Name: Bethany [name] MRN: [id number] DOB: 1955/11/28   Cancelled Treatment:    Reason Eval/Treat Not Completed: Patient at procedure or test/unavailable. Pt currently undergoing exploratory laparotomy. Will check back as time allows.  Benjiman Core, PTA Pager [phone] Acute Rehab  Allena Katz 04/22/2018, 9:49 AM

## 2018-04-22 NOTE — Progress Notes (Signed)
   Subjective/Chief Complaint: NAUSEA Pt with no BM OR FLATUS  NO MORE VOMITING   Objective: Vital signs in last 24 hours: Temp:  [97.8 F (36.6 C)-98.4 F (36.9 C)] 98.4 F (36.9 C) (06/21 0437) Pulse Rate:  [64-66] 65 (06/21 0437) Resp:  [16] 16 (06/21 0437) BP: (142-148)/(71-74) 145/73 (06/21 0437) SpO2:  [100 %] 100 % (06/21 0437) Last BM Date: 04/20/18  Intake/Output from previous day: 06/20 0701 - 06/21 0700 In: 1175 [P.O.:240; I.V.:935] Out: -  Intake/Output this shift: No intake/output data recorded.  GI: MILD DISTENTION MILD TTP no rebound or guarding   Lab Results:  Recent Labs    04/21/18 0654 04/22/18 0702  WBC 8.5 10.4  HGB 10.8* 11.7*  HCT 34.0* 36.4  PLT 273 312   BMET Recent Labs    04/20/18 0605 04/21/18 0654  NA 148* 145  K 3.1* 3.8  CL 110 111  CO2 33* 30  GLUCOSE 137* 109*  BUN 20 14  CREATININE 0.77 0.67  CALCIUM 8.8* 8.6*   PT/INR No results for input(s): LABPROT, INR in the last 72 hours. ABG No results for input(s): PHART, HCO3 in the last 72 hours.  Invalid input(s): PCO2, PO2  Studies/Results: Dg Abd 1 View  Result Date: 04/21/2018 CLINICAL DATA:  Small bowel obstruction. EXAM: ABDOMEN - 1 VIEW COMPARISON:  Radiographs of April 20, 2018. FINDINGS: Stable small bowel dilatation is noted concerning for distal small bowel obstruction. No colonic dilatation is noted. Phleboliths are noted in the pelvis. IMPRESSION: Stable small bowel dilatation concerning for distal small bowel obstruction. Electronically Signed   By: Marijo Conception, M.D.   On: 04/21/2018 08:38   Dg Abd Portable 1v  Result Date: 04/22/2018 CLINICAL DATA:  Follow up small bowel obstruction EXAM: PORTABLE ABDOMEN - 1 VIEW COMPARISON:  04/21/2018 FINDINGS: Multiple dilated loops of small bowel are again identified roughly similar to that seen on the prior exam. The bladder is distended. No free air is seen. No acute bony abnormality is noted. IMPRESSION: Stable  small bowel dilatation. Electronically Signed   By: Inez Catalina M.D.   On: 04/22/2018 07:02    Anti-infectives: Anti-infectives (From admission, onward)   None      Assessment/Plan: High grade PSBO She has failed medical management since she cannot tolerate diet Recommend exploratory laparotomy   The procedure has been discussed with the patient.  Alternative therapies have been discussed with the patient.  Operative risks include bleeding,  Infection,  Organ injury,  Nerve injury,  Bowel injury, bowel resection, ostomy ,  Blood vessel injury,  DVT,  Pulmonary embolism,  Death,  And possible reoperation.  Medical management risks include worsening of present situation.  The success of the procedure is 50 -90 % at treating patients symptoms.  The patient understands and agrees to proceed.   LOS: 5 days    Joyice Faster Anaija Wissink 04/22/2018

## 2018-04-22 NOTE — Interval H&P Note (Signed)
History and Physical Interval Note:  04/22/2018 9:50 AM  Bethany [name]  has presented today for surgery, with the diagnosis of SMALL BOWEL OBSTRUCTION  The various methods of treatment have been discussed with the patient and family. After consideration of risks, benefits and other options for treatment, the patient has consented to  Procedure(s): EXPLORATORY LAPAROTOMY (N/A) as a surgical intervention .  The patient's history has been reviewed, patient examined, no change in status, stable for surgery.  I have reviewed the patient's chart and labs.  Questions were answered to the patient's satisfaction.     Poy Sippi

## 2018-04-22 NOTE — Anesthesia Preprocedure Evaluation (Signed)
Anesthesia Evaluation  Patient identified by MRN, date of birth, ID band Patient awake    Reviewed: Allergy & Precautions, NPO status , Patient's Chart, lab work & pertinent test results  Airway Mallampati: II  TM Distance: >3 FB Neck ROM: Full    Dental no notable dental hx.    Pulmonary neg pulmonary ROS, former smoker,    Pulmonary exam normal breath sounds clear to auscultation       Cardiovascular Normal cardiovascular exam Rhythm:Regular Rate:Normal     Neuro/Psych negative neurological ROS  negative psych ROS   GI/Hepatic negative GI ROS, Neg liver ROS,   Endo/Other  negative endocrine ROS  Renal/GU negative Renal ROS  negative genitourinary   Musculoskeletal negative musculoskeletal ROS (+)   Abdominal   Peds negative pediatric ROS (+)  Hematology negative hematology ROS (+)   Anesthesia Other Findings   Reproductive/Obstetrics negative OB ROS                             Anesthesia Physical Anesthesia Plan  ASA: II  Anesthesia Plan: General   Post-op Pain Management:    Induction: Intravenous  PONV Risk Score and Plan: 3 and Ondansetron, Dexamethasone, Midazolam and Treatment may vary due to age or medical condition  Airway Management Planned: Oral ETT  Additional Equipment:   Intra-op Plan:   Post-operative Plan: Extubation in OR  Informed Consent: I have reviewed the patients History and Physical, chart, labs and discussed the procedure including the risks, benefits and alternatives for the proposed anesthesia with the patient or authorized representative who has indicated his/her understanding and acceptance.   Dental advisory given  Plan Discussed with: CRNA and Surgeon  Anesthesia Plan Comments:         Anesthesia Quick Evaluation

## 2018-04-22 NOTE — Transfer of Care (Signed)
Immediate Anesthesia Transfer of Care Note  Patient: Bethany [name]  Procedure(s) Performed: EXPLORATORY LAPAROTOMY (N/A Abdomen)  Patient Location: PACU  Anesthesia Type:General  Level of Consciousness: awake, alert , oriented and patient cooperative  Airway & Oxygen Therapy: Patient Spontanous Breathing and Patient connected to nasal cannula oxygen  Post-op Assessment: Report given to RN, Post -op Vital signs reviewed and stable and Patient moving all extremities X 4  Post vital signs: Reviewed and stable  Last Vitals:  Vitals Value Taken Time  BP 161/70 04/22/2018 12:16 PM  Temp 36.5 C 04/22/2018 12:16 PM  Pulse 96 04/22/2018 12:21 PM  Resp 10 04/22/2018 12:21 PM  SpO2 100 % 04/22/2018 12:21 PM  Vitals shown include unvalidated device data.  Last Pain:  Vitals:   04/22/18 1216  TempSrc:   PainSc: Asleep      Patients Stated Pain Goal: 3 ([id number])  Complications: No apparent anesthesia complications

## 2018-04-22 NOTE — Progress Notes (Signed)
PROGRESS NOTE                                                                                                                                                                                                             Patient Demographics:    Bethany [name], is a 62 y.o. female, DOB - 25-Oct-1956, PNT:[id number]  Admit date - 04/17/2018   Admitting Physician Lady Deutscher, MD  Outpatient Primary MD for the patient is Denita Lung, MD  LOS - 5   Chief Complaint  Patient presents with  . Constipation  . Abdominal Pain       Brief Narrative   Bethany [name] is a 62 year old female with past medical history significant for osteoarthritis of the right knee status post right total knee replacement on 04/12/2018 who presented with worsening generalized abdominal pain.  CT abdomen pelvis with contrast done on 04/17/2018 revealed moderate to high-grade partial small bowel obstruction.  Prior hysterectomy noted on imaging.  Recently on opiates however stated that she was taking laxative and stool softener along with her pain medications.  History of tubular adenoma of colon one year ago.  Admitted for partial small bowel obstruction.    No improvement with NG tube n.p.o. with IV fluids,   Subjective:    [name] today has, No headache, No chest pain, No abdominal pain, she had bloating, nausea and vomiting could not tolerate her oral intake   Assessment  & Plan :    Principal Problem:   SBO (small bowel obstruction) (HCC) Active Problems:   Raynaud's disease   Recurrent herpes labialis   Mood disorder (HCC)   Tubular adenoma of colon   Primary osteoarthritis of right knee   IBS (irritable bowel syndrome)    Partial small bowel obstruction. 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  Status post right total knee replacement  - appears stable , continue with as needed pain medication, continue with PT and ambulation Weightbearing as tolerated as recommended by orthopedic surgery Pain management with IV Toradol  due to partial small bowel obstruction  Hyperlipidemia - Continue to hold statin due to n.p.o.  Persistent hypokalemia - repleted,   Hypernatremia -Resolved, continue with IV fluids  Chronic macrocytic anemia - Hemoglobin 10.7; baseline hemoglobin 12 - MCV 101.9, b12 WnL follow on folic acid  Ambulatory dysfunction due to recent R knee surgery - Continue PT as recommended by orthopedic surgery     Code Status : Full  Family Communication  : Husband at bedside  Disposition Plan  : Home when stable  Consults  :  General surgery  Procedures  : none  DVT Prophylaxis  :  [place] lovenox  Lab Results  Component Value Date   PLT 312 04/22/2018    Antibiotics  :    Anti-infectives (From admission, onward)   None        Objective:   Vitals:   04/22/18 1216 04/22/18 1228 04/22/18 1243 04/22/18 1319  BP: (!) 161/70 (!) 146/74 (!) 157/78 (!) 152/76  Pulse:  99 96 84  Resp:  11 12 14   Temp: 97.7 F (36.5 C)   97.9 F (36.6 C)  TempSrc:    Oral  SpO2:  92% 94% 98%  Weight:      Height:        Wt Readings from Last 3 Encounters:  04/22/18 54.9 kg (121 lb)  04/12/18 55.1 kg (121 lb 6.4 oz)  03/31/18 55.1 kg (121 lb 6.4 oz)     Intake/Output Summary (Last 24 hours) at 04/22/2018 1420 Last data filed at 04/22/2018 1228 Gross per 24 hour  Intake 1600 ml  Output 1980 ml  Net -380 ml     Physical Exam  (And examined earlier in a.m. before her surgery) Awake Alert, Oriented X 3, No new F.N deficits, Normal  affect Symmetrical Chest wall movement, Good air movement bilaterally, CTAB RRR,No Gallops,Rubs or new Murmurs, No Parasternal Heave +ve B.Sounds, mildly distended, with tenderness to palpation, but no rebound and no guarding  No Cyanosis, Clubbing or edema, No new Rash or bruise     Data Review:    CBC Recent Labs  Lab 04/18/18 0333 04/19/18 0613 04/20/18 0605 04/21/18 0654 04/22/18 0702  WBC 5.4 5.7 7.4 8.5 10.4  HGB 10.7* 10.0* 9.9* 10.8* 11.7*  HCT 32.6* 31.4*  29.5* 30.8* 34.0* 36.4  PLT 203 218 226 273 312  MCV 101.9* 103.3* 103.0* 104.3* 105.2*  MCH 33.4 32.9 33.1 33.1 33.8  MCHC 32.8 31.8 32.1 31.8 32.1  RDW 12.1 12.3 12.5 12.5 12.6    Chemistries  Recent Labs  Lab 04/17/18 0353 04/18/18 0333 04/19/18 0613 04/20/18 0605 04/21/18 0654 04/22/18 0702  NA 139 142 144 148* 145 144  K 3.4* 3.3* 3.3* 3.1* 3.8 3.9  CL 93* 101 105 110 111 109  CO2 35* 29 27 33* 30 29  GLUCOSE 137* 81 84 137* 109* 102*  BUN 10 16 21* 20 14 13   CREATININE 0.92 0.98 0.84 0.77 0.67 0.84  CALCIUM 9.2 8.7* 8.7* 8.8* 8.6* 8.8*  MG  --   --   --  1.8 1.8 1.9  AST 23  --   --   --   --   --   ALT 14  --   --   --   --   --   ALKPHOS 49  --   --   --   --   --   BILITOT 0.9  --   --   --   --   --    ------------------------------------------------------------------------------------------------------------------  No results for input(s): CHOL, HDL, LDLCALC, TRIG, CHOLHDL, LDLDIRECT in the last 72 hours.  No results found for: HGBA1C ------------------------------------------------------------------------------------------------------------------ No results for input(s): TSH, T4TOTAL, T3FREE, THYROIDAB in the last 72 hours.  Invalid input(s): FREET3 ------------------------------------------------------------------------------------------------------------------ No results for input(s): VITAMINB12, FOLATE, FERRITIN, TIBC, IRON, RETICCTPCT in the last 72 hours.  Coagulation  profile No results for input(s): INR, PROTIME in the last 168 hours.  No results for input(s): DDIMER in the last 72 hours.  Cardiac Enzymes No results for input(s): CKMB, TROPONINI, MYOGLOBIN in the last 168 hours.  Invalid input(s): CK ------------------------------------------------------------------------------------------------------------------ No results found for: BNP  Inpatient Medications  Scheduled Meds: . [START ON 04/23/2018] enoxaparin (LOVENOX) injection  40 mg Subcutaneous Q24H  . HYDROmorphone      . mouth rinse  15 mL Mouth Rinse BID   Continuous Infusions: . dextrose 5% lactated ringers with KCl 20 mEq/L Stopped (04/22/18 0935)   PRN Meds:.acetaminophen **OR** acetaminophen, bisacodyl, fentaNYL (SUBLIMAZE) injection, LORazepam, ondansetron **OR** ondansetron (ZOFRAN) IV, phenol  Micro Results No results found for this or any previous visit (from the past 240 hour(s)).  Radiology Reports Dg Chest 2 View  Result Date: 04/01/2018 CLINICAL DATA:  Preoperative for right total knee arthroplasty. EXAM: CHEST - 2 VIEW COMPARISON:  None. FINDINGS: Normal heart size. Normal mediastinal contour. No pneumothorax. No pleural effusion. Lungs appear clear, with no acute consolidative airspace disease and no pulmonary edema. IMPRESSION: No active cardiopulmonary disease. Electronically Signed   By: Ilona Sorrel M.D.   On: 04/01/2018 08:15   Dg Abd 1 View  Result Date: 04/21/2018 CLINICAL DATA:  Small bowel obstruction. EXAM: ABDOMEN - 1 VIEW COMPARISON:  Radiographs of April 20, 2018. FINDINGS: Stable small bowel dilatation is noted concerning for distal small bowel obstruction. No colonic dilatation is noted. Phleboliths are noted in the pelvis. IMPRESSION: Stable small bowel dilatation concerning for distal small bowel obstruction. Electronically Signed   By: Marijo Conception, M.D.   On: 04/21/2018 08:38   Dg Abd 1 View  Result Date: 04/20/2018 CLINICAL DATA:  Evaluate for  small bowel obstruction and NG tube placement EXAM: ABDOMEN - 1 VIEW COMPARISON:  04/19/2018 FINDINGS: NG tube tip is in the distal esophagus with the side port in the mid esophagus. This should be advanced several cm. Small bowel dilatation noted in the abdomen compatible with small bowel obstruction as seen on prior study. Bibasilar atelectasis. IMPRESSION: NG tube tip in the distal esophagus. Recommend advancing several cm. Small bowel obstruction pattern, stable. Electronically Signed   By: Rolm Baptise M.D.   On: 04/20/2018 08:16   Dg Abd 1 View  Result Date: 04/19/2018 CLINICAL DATA:  Small bowel obstruction. EXAM: ABDOMEN - 1 VIEW COMPARISON:  CT abdomen and pelvis April 17, 2018; abdominal radiograph April 18, 2018 FINDINGS: Nasogastric tube tip is in the gastric cardia region. The side-port is not seen but is felt to be above the diaphragm. There remain loops of dilated small bowel, similar to 1 day prior. No free air. There are phleboliths in the pelvis. IMPRESSION: Findings indicative of a degree of persistent bowel obstruction. No free air. Nasogastric tube tip in proximal stomach with side port above the diaphragm. Advise advancing nasogastric tube approximately 8 cm to insure that side-port as well as nasogastric tube tip are within the stomach. Electronically Signed   By: Lowella Grip III M.D.   On: 04/19/2018 07:55   Ct Abdomen Pelvis W Contrast  Result Date: 04/17/2018 CLINICAL DATA:  Constipation,  abdominal pain, vomiting EXAM: CT ABDOMEN AND PELVIS WITH CONTRAST TECHNIQUE: Multidetector CT imaging of the abdomen and pelvis was performed using the standard protocol following bolus administration of intravenous contrast. CONTRAST:  165mL OMNIPAQUE IOHEXOL 300 MG/ML  SOLN COMPARISON:  None. FINDINGS: Lower chest: Bilateral breast implants partially imaged. Linear atelectasis or scarring at the right base. No effusions. Hepatobiliary: No focal hepatic abnormality. Gallbladder unremarkable.  Pancreas: No focal abnormality or ductal dilatation. Spleen: No focal abnormality.  Normal size. Adrenals/Urinary Tract: No adrenal abnormality. No focal renal abnormality. No stones or hydronephrosis. Urinary bladder is unremarkable. Stomach/Bowel: Dilated fluid-filled small bowel loops with air-fluid levels noted throughout the abdomen and pelvis. Distal small bowel loops appear decompressed. Exact transition is not visualize, but findings compatible with moderate to high-grade partial small bowel obstruction. Large bowel decompressed, grossly unremarkable. Vascular/Lymphatic: No evidence of aneurysm or adenopathy. Reproductive: Prior hysterectomy.  No adnexal masses. Other: Small amount of free fluid in the pelvis.  No free air. Musculoskeletal: No acute bony abnormality. L5 related grade 1 anterolisthesis of to facet disease L4 on. Degenerative disc disease at L4-5. IMPRESSION: Findings compatible with moderate to high-grade partial small bowel obstruction. Exact transition and cause not visualized, presumably adhesions. Prior hysterectomy. Electronically Signed   By: Rolm Baptise M.D.   On: 04/17/2018 09:11   Dg Abd Portable 1v  Result Date: 04/22/2018 CLINICAL DATA:  Follow up small bowel obstruction EXAM: PORTABLE ABDOMEN - 1 VIEW COMPARISON:  04/21/2018 FINDINGS: Multiple dilated loops of small bowel are again identified roughly similar to that seen on the prior exam. The bladder is distended. No free air is seen. No acute bony abnormality is noted. IMPRESSION: Stable small bowel dilatation. Electronically Signed   By: Inez Catalina M.D.   On: 04/22/2018 07:02   Dg Abd Portable 1v-small Bowel Obstruction Protocol-24 Hr Delay  Result Date: 04/18/2018 CLINICAL DATA:  Small-bowel obstruction, 24 hour delay EXAM: PORTABLE ABDOMEN - 1 VIEW COMPARISON:  Portable exam 1635 hours compared to 04/18/2018 FINDINGS: Tip of orogastric tube projects over proximal stomach though the proximal side-port projects over  the distal esophagus; recommend advancing tube 7 cm. Small amount gas within stomach. Persistent significant gaseous distention of small bowel loops consistent with small bowel obstruction, small bowel measuring up to 4.2 cm transverse slightly less than the 4.6 cm on the prior exam. No colonic gas. Questionable mild wall thickening of a single small bowel loop in the LEFT upper quadrant. New osseous structures unremarkable. Lung bases clear. IMPRESSION: Persistent small bowel obstruction. Electronically Signed   By: Lavonia Dana M.D.   On: 04/18/2018 16:57   Dg Abd Portable 1v-small Bowel Obstruction Protocol-initial, 8 Hr Delay  Result Date: 04/18/2018 CLINICAL DATA:  Follow up small bowel obstruction. EXAM: PORTABLE ABDOMEN - 1 VIEW COMPARISON:  Abdominal radiograph April 17, 2018 FINDINGS: Persistent gas distended small bowel measuring to 4.2 cm. Paucity of large bowel gas. Nasogastric tube tip projects at proximal stomach. Phleboliths project in the pelvis. Soft tissue and osseous structures non suspicious. IMPRESSION: Unchanged small bowel obstruction. Nasogastric tube tip projects in proximal stomach. Electronically Signed   By: Elon Alas M.D.   On: 04/18/2018 01:42   Dg Abd Portable 1v-small Bowel Protocol-position Verification  Result Date: 04/17/2018 CLINICAL DATA:  Confirm NG tube placement. EXAM: PORTABLE ABDOMEN - 1 VIEW COMPARISON:  04/17/2018. FINDINGS: Enteric tube is identified with tip 2.8 cm below the level of the GE junction. The side port is 5 cm above the GE junction. Persistent  small bowel dilatation compatible with obstruction. IMPRESSION: The tip of the NG tube projects over the proximal stomach just below the level of the GE junction and the side port is above the GE junction. Advise further tube advancement such that the side port is at least below the GE junction. Electronically Signed   By: Kerby Moors M.D.   On: 04/17/2018 10:50     Phillips Climes M.D on 04/22/2018  at 2:20 PM  Between 7am to 7pm - Pager - [phone]  After 7pm go to [email or web address] - password [facility]  [facility] -  Office  [phone]

## 2018-04-22 NOTE — Op Note (Signed)
Preoperative diagnosis: Persistent high-grade partial small bowel obstruction  Postoperative diagnosis: Same  Procedure: Exploratory laparotomy with lysis of adhesions and small bowel resection for chronic stenosed distal ileum secondary to long-standing adhesions  Surgeon: Erroll Luna, MD  Anesthesia: General  EBL: 20 cc  Specimens: Ileum  Drains: None  Assistant: Dr. Georganna Skeans  IV fluids: Per anesthesia record  Indications for procedure: The patient is a 62 year old female who was admitted 5 days ago with episodes of small bowel obstruction.  She initially improved with medical management but then subsequently failed.  She presents today for laparotomy for persistent high-grade partial small bowel obstruction.The procedure has been discussed with the patient.  Alternative therapies have been discussed with the patient.  Operative risks include bleeding,  Infection,  Organ injury,  Nerve injury,  Blood vessel injury,  DVT,  Pulmonary embolism,  Bowel resection/ injury,  Death,  And possible reoperation.  Medical management risks include worsening of present situation.  The success of the procedure is 50 -90 % at treating patients symptoms.  The patient understands and agrees to proceed.  Description of procedure: The patient was met in the holding area.  The procedure was discussed as well as risk and benefits and questions were answered.  She was taken back to the operating room.  She was placed supine upon the operating room table.  After induction of general anesthesia she was prepped and draped in a sterile fashion and timeout was done.  She received preoperative antibiotics.  A nasogastric tube was placed.  Lower midline incision was used.  Dissection was carried down the fascia midline this was opened.  She had significant small bowel dilation into her pelvis.  Her terminal ileum was identified it was decompressed.  She had dense pelvic adhesions from previous gynecological  surgery.  We carefully dissected the distal loop of ileum that was stuck in the pelvis with scissors.  We were able to mobilize this up.  She had chronic stricturing of a segment of her distal ileum that had 2 points of compression which appeared chronic.  We elected to resect this.  Using a GIA-75 stapling device with divided both sides of these 2 chronic strictures.  The area measured about 20 cm.  The mesentery taken down with LigaSure.  We then made a side-to-side functional end-to-end anastomosis using a GIA stapler 75 and a TA 60 to close the common defect.  The anastomosis had no tension and there is no signs of bleeding at the anastomosis.  There is adequate room for contents to pass through the anastomosis without leakage.,  Mesenteric defect closed with 2-0 Vicryl.  This was replaced back the abdominal cavity.  Small bowel was run from ligament of Treitz to the terminal ileum which was decompressed and there is no other stricture, adhesion or twisting.  The bowel was viable.  A ascending, transverse, descending and sigmoid colons were grossly normal.  Stomach was normal.  Liver felt grossly normal.  Irrigation was used and suctioned out.  We then closed the fascia with #1 double-stranded PDS.  Skin staples used for skin.  Honeycomb dressing applied.  All final counts were found to be correct.  Patient was awoke extubated taken recovery in satisfactory condition.

## 2018-04-22 NOTE — H&P (View-Only) (Signed)
   Subjective/Chief Complaint: NAUSEA Pt with no BM OR FLATUS  NO MORE VOMITING   Objective: Vital signs in last 24 hours: Temp:  [97.8 F (36.6 C)-98.4 F (36.9 C)] 98.4 F (36.9 C) (06/21 0437) Pulse Rate:  [64-66] 65 (06/21 0437) Resp:  [16] 16 (06/21 0437) BP: (142-148)/(71-74) 145/73 (06/21 0437) SpO2:  [100 %] 100 % (06/21 0437) Last BM Date: 04/20/18  Intake/Output from previous day: 06/20 0701 - 06/21 0700 In: 1175 [P.O.:240; I.V.:935] Out: -  Intake/Output this shift: No intake/output data recorded.  GI: MILD DISTENTION MILD TTP no rebound or guarding   Lab Results:  Recent Labs    04/21/18 0654 04/22/18 0702  WBC 8.5 10.4  HGB 10.8* 11.7*  HCT 34.0* 36.4  PLT 273 312   BMET Recent Labs    04/20/18 0605 04/21/18 0654  NA 148* 145  K 3.1* 3.8  CL 110 111  CO2 33* 30  GLUCOSE 137* 109*  BUN 20 14  CREATININE 0.77 0.67  CALCIUM 8.8* 8.6*   PT/INR No results for input(s): LABPROT, INR in the last 72 hours. ABG No results for input(s): PHART, HCO3 in the last 72 hours.  Invalid input(s): PCO2, PO2  Studies/Results: Dg Abd 1 View  Result Date: 04/21/2018 CLINICAL DATA:  Small bowel obstruction. EXAM: ABDOMEN - 1 VIEW COMPARISON:  Radiographs of April 20, 2018. FINDINGS: Stable small bowel dilatation is noted concerning for distal small bowel obstruction. No colonic dilatation is noted. Phleboliths are noted in the pelvis. IMPRESSION: Stable small bowel dilatation concerning for distal small bowel obstruction. Electronically Signed   By: Marijo Conception, M.D.   On: 04/21/2018 08:38   Dg Abd Portable 1v  Result Date: 04/22/2018 CLINICAL DATA:  Follow up small bowel obstruction EXAM: PORTABLE ABDOMEN - 1 VIEW COMPARISON:  04/21/2018 FINDINGS: Multiple dilated loops of small bowel are again identified roughly similar to that seen on the prior exam. The bladder is distended. No free air is seen. No acute bony abnormality is noted. IMPRESSION: Stable  small bowel dilatation. Electronically Signed   By: Inez Catalina M.D.   On: 04/22/2018 07:02    Anti-infectives: Anti-infectives (From admission, onward)   None      Assessment/Plan: High grade PSBO She has failed medical management since she cannot tolerate diet Recommend exploratory laparotomy   The procedure has been discussed with the patient.  Alternative therapies have been discussed with the patient.  Operative risks include bleeding,  Infection,  Organ injury,  Nerve injury,  Bowel injury, bowel resection, ostomy ,  Blood vessel injury,  DVT,  Pulmonary embolism,  Death,  And possible reoperation.  Medical management risks include worsening of present situation.  The success of the procedure is 50 -90 % at treating patients symptoms.  The patient understands and agrees to proceed.   LOS: 5 days    Joyice Faster Kody Brandl 04/22/2018

## 2018-04-22 NOTE — Progress Notes (Signed)
Mostly social visit.  Appreciate expert care of Dr Brantley Stage.  Looks well postop.

## 2018-04-23 LAB — CBC
HCT: 36.9 % (ref 36.0–46.0)
HEMOGLOBIN: 12.3 g/dL (ref 12.0–15.0)
MCH: 33.6 pg (ref 26.0–34.0)
MCHC: 33.3 g/dL (ref 30.0–36.0)
MCV: 100.8 fL — ABNORMAL HIGH (ref 78.0–100.0)
Platelets: 337 10*3/uL (ref 150–400)
RBC: 3.66 MIL/uL — ABNORMAL LOW (ref 3.87–5.11)
RDW: 12.3 % (ref 11.5–15.5)
WBC: 16.3 10*3/uL — ABNORMAL HIGH (ref 4.0–10.5)

## 2018-04-23 LAB — BASIC METABOLIC PANEL
ANION GAP: 5 (ref 5–15)
BUN: 9 mg/dL (ref 6–20)
CALCIUM: 8.3 mg/dL — AB (ref 8.9–10.3)
CO2: 31 mmol/L (ref 22–32)
Chloride: 104 mmol/L (ref 101–111)
Creatinine, Ser: 0.72 mg/dL (ref 0.44–1.00)
GFR calc Af Amer: >60 mL/min (ref >60)
GFR calc non Af Amer: >60 mL/min (ref >60)
GLUCOSE: 136 mg/dL — AB (ref 65–99)
Potassium: 4.1 mmol/L (ref 3.5–5.1)
Sodium: 140 mmol/L (ref 135–145)

## 2018-04-23 MED ORDER — HYDROMORPHONE HCL 2 MG/ML IJ SOLN
0.5000 mg | INTRAMUSCULAR | Status: DC | PRN
  Administered 2018-04-23 (×5): 1 mg via INTRAVENOUS
  Administered 2018-04-24 (×2): 2 mg via INTRAVENOUS
  Administered 2018-04-24: 1 mg via INTRAVENOUS
  Administered 2018-04-24 (×2): 2 mg via INTRAVENOUS
  Administered 2018-04-24: 1 mg via INTRAVENOUS
  Administered 2018-04-24 – 2018-04-26 (×6): 2 mg via INTRAVENOUS
  Filled 2018-04-23 (×17): qty 1

## 2018-04-23 MED ORDER — HYDROMORPHONE HCL 2 MG/ML IJ SOLN
0.5000 mg | Freq: Once | INTRAMUSCULAR | Status: AC
  Administered 2018-04-23: 0.5 mg via INTRAVENOUS
  Filled 2018-04-23: qty 1

## 2018-04-23 NOTE — Progress Notes (Signed)
PROGRESS NOTE                                                                                                                                                                                                             Patient Demographics:    Bethany [name], is a 62 y.o. female, DOB - Apr 11, 1956, EKC:[id number]  Admit date - 04/17/2018   Admitting Physician Lady Deutscher, MD  Outpatient Primary MD for the patient is Bethany Lung, MD  LOS - 6   Chief Complaint  Patient presents with  . Constipation  . Abdominal Pain       Brief Narrative   Bethany [name] is a 62 year old female with past medical history significant for osteoarthritis of the right knee status post right total knee replacement on 04/12/2018 who presented with worsening generalized abdominal pain.  CT abdomen pelvis with contrast done on 04/17/2018 revealed moderate to high-grade partial small bowel obstruction.  Prior hysterectomy noted on imaging.  Recently on opiates however stated that she was taking laxative and stool softener along with her pain medications.  History of tubular adenoma of colon one year ago.  Admitted for partial small bowel obstruction.    No improvement with NG tube n.p.o. with IV fluids,   Subjective:    Bethany [name] today has, No headache, No chest pain, reports some expected abdominal pain,   Assessment  & Plan :    Principal Problem:   SBO (small bowel obstruction) (HCC) Active Problems:   Raynaud's disease   Recurrent herpes labialis   Mood disorder (HCC)   Tubular adenoma of colon   Primary osteoarthritis of right knee   IBS (irritable bowel syndrome)    Partial small bowel obstruction. -With initial improvement after NGT insertion, but with recurrent evidence of small bowel obstruction on imaging, unfortunately after discontinuing her NG tube she had recurrence of symptoms, so general surgery input greatly appreciated, status post exploratory  laparotomy 04/22/2018 with lysis of adhesions and small bowel resection for chronic stenosis distal ileum secondary to long-standing adhesions . -She remains with NGT on ILS, remains n.p.o., kidney with IV fluid, pain management per general surgery -Was encouraged to ambulate -Cytosis most likely stress related to surgery, she is afebrile, will recheck in a.m. with differentials  Status post right total knee replacement  - appears stable ,  continue with as needed pain medication, continue with PT and ambulation Weightbearing as tolerated as recommended by orthopedic surgery Pain management with IV Toradol due to partial small bowel obstruction  Hyperlipidemia - Continue to hold statin due to n.p.o.  Persistent hypokalemia - repleted,   Hypernatremia -Resolved, continue with IV fluids  Chronic macrocytic anemia - Hemoglobin 10.7; baseline hemoglobin 12 - MCV 101.9, b12 WnL follow on folic acid  Ambulatory dysfunction due to recent R knee surgery - Continue PT as recommended by orthopedic surgery     Code Status : Full  Family Communication  : Husband at bedside  Disposition Plan  : Home when stable  Consults  :  General surgery  Procedures  : none  DVT Prophylaxis  :  Inez lovenox  Lab Results  Component Value Date   PLT 337 04/23/2018    Antibiotics  :    Anti-infectives (From admission, onward)   None        Objective:   Vitals:   04/23/18 0135 04/23/18 0457 04/23/18 0932 04/23/18 1356  BP: (!) 162/88 (!) 141/81 124/70 123/81  Pulse: 87 80 81 87  Resp: 16 16 17 17   Temp: 97.6 F (36.4 C) 97.9 F (36.6 C) 99 F (37.2 C) 99.2 F (37.3 C)  TempSrc: Oral Oral Oral Oral  SpO2: 100% 100% 100% 95%  Weight:      Height:        Wt Readings from Last 3 Encounters:  04/22/18 54.9 kg (121 lb)  04/12/18 55.1 kg (121 lb 6.4 oz)  03/31/18 55.1 kg (121 lb 6.4 oz)     Intake/Output Summary (Last 24 hours) at 04/23/2018 1519 Last data filed at  04/23/2018 1317 Gross per 24 hour  Intake 2482.55 ml  Output 2450 ml  Net 32.55 ml     Physical Exam  Awake Alert, Oriented X 3, No new F.N deficits, Normal affect Symmetrical Chest wall movement, Good air movement bilaterally, CTAB RRR,No Gallops,Rubs or new Murmurs, No Parasternal Heave +ve B.Sounds, she has midline surgical wound covered with honeycomb mesh, has diminished power sound, and expected postoperative tenderness to palpation . No Cyanosis, Clubbing or edema, No new Rash or bruise      Data Review:    CBC Recent Labs  Lab 04/19/18 0613 04/20/18 0605 04/21/18 0654 04/22/18 0702 04/23/18 0440  WBC 5.7 7.4 8.5 10.4 16.3*  HGB 10.0* 9.9* 10.8* 11.7* 12.3  HCT 31.4*  29.5* 30.8* 34.0* 36.4 36.9  PLT 218 226 273 312 337  MCV 103.3* 103.0* 104.3* 105.2* 100.8*  MCH 32.9 33.1 33.1 33.8 33.6  MCHC 31.8 32.1 31.8 32.1 33.3  RDW 12.3 12.5 12.5 12.6 12.3    Chemistries  Recent Labs  Lab 04/17/18 0353  04/19/18 0613 04/20/18 0605 04/21/18 0654 04/22/18 0702 04/23/18 0440  NA 139   < > 144 148* 145 144 140  K 3.4*   < > 3.3* 3.1* 3.8 3.9 4.1  CL 93*   < > 105 110 111 109 104  CO2 35*   < > 27 33* 30 29 31   GLUCOSE 137*   < > 84 137* 109* 102* 136*  BUN 10   < > 21* 20 14 13 9   CREATININE 0.92   < > 0.84 0.77 0.67 0.84 0.72  CALCIUM 9.2   < > 8.7* 8.8* 8.6* 8.8* 8.3*  MG  --   --   --  1.8 1.8 1.9  --   AST 23  --   --   --   --   --   --  ALT 14  --   --   --   --   --   --   ALKPHOS 49  --   --   --   --   --   --   BILITOT 0.9  --   --   --   --   --   --    < > = values in this interval not displayed.   ------------------------------------------------------------------------------------------------------------------ No results for input(s): CHOL, HDL, LDLCALC, TRIG, CHOLHDL, LDLDIRECT in the last 72 hours.  No results found for:  HGBA1C ------------------------------------------------------------------------------------------------------------------ No results for input(s): TSH, T4TOTAL, T3FREE, THYROIDAB in the last 72 hours.  Invalid input(s): FREET3 ------------------------------------------------------------------------------------------------------------------ No results for input(s): VITAMINB12, FOLATE, FERRITIN, TIBC, IRON, RETICCTPCT in the last 72 hours.  Coagulation profile No results for input(s): INR, PROTIME in the last 168 hours.  No results for input(s): DDIMER in the last 72 hours.  Cardiac Enzymes No results for input(s): CKMB, TROPONINI, MYOGLOBIN in the last 168 hours.  Invalid input(s): CK ------------------------------------------------------------------------------------------------------------------ No results found for: BNP  Inpatient Medications  Scheduled Meds: . enoxaparin (LOVENOX) injection  40 mg Subcutaneous Q24H  . mouth rinse  15 mL Mouth Rinse BID   Continuous Infusions: . dextrose 5% lactated ringers with KCl 20 mEq/L 125 mL/hr at 04/23/18 1317   PRN Meds:.acetaminophen **OR** acetaminophen, bisacodyl, fentaNYL (SUBLIMAZE) injection, HYDROmorphone (DILAUDID) injection, LORazepam, ondansetron **OR** ondansetron (ZOFRAN) IV, phenol  Micro Results No results found for this or any previous visit (from the past 240 hour(s)).  Radiology Reports Dg Chest 2 View  Result Date: 04/01/2018 CLINICAL DATA:  Preoperative for right total knee arthroplasty. EXAM: CHEST - 2 VIEW COMPARISON:  None. FINDINGS: Normal heart size. Normal mediastinal contour. No pneumothorax. No pleural effusion. Lungs appear clear, with no acute consolidative airspace disease and no pulmonary edema. IMPRESSION: No active cardiopulmonary disease. Electronically Signed   By: Ilona Sorrel M.D.   On: 04/01/2018 08:15   Dg Abd 1 View  Result Date: 04/21/2018 CLINICAL DATA:  Small bowel obstruction. EXAM:  ABDOMEN - 1 VIEW COMPARISON:  Radiographs of April 20, 2018. FINDINGS: Stable small bowel dilatation is noted concerning for distal small bowel obstruction. No colonic dilatation is noted. Phleboliths are noted in the pelvis. IMPRESSION: Stable small bowel dilatation concerning for distal small bowel obstruction. Electronically Signed   By: Marijo Conception, M.D.   On: 04/21/2018 08:38   Dg Abd 1 View  Result Date: 04/20/2018 CLINICAL DATA:  Evaluate for small bowel obstruction and NG tube placement EXAM: ABDOMEN - 1 VIEW COMPARISON:  04/19/2018 FINDINGS: NG tube tip is in the distal esophagus with the side port in the mid esophagus. This should be advanced several cm. Small bowel dilatation noted in the abdomen compatible with small bowel obstruction as seen on prior study. Bibasilar atelectasis. IMPRESSION: NG tube tip in the distal esophagus. Recommend advancing several cm. Small bowel obstruction pattern, stable. Electronically Signed   By: Rolm Baptise M.D.   On: 04/20/2018 08:16   Dg Abd 1 View  Result Date: 04/19/2018 CLINICAL DATA:  Small bowel obstruction. EXAM: ABDOMEN - 1 VIEW COMPARISON:  CT abdomen and pelvis April 17, 2018; abdominal radiograph April 18, 2018 FINDINGS: Nasogastric tube tip is in the gastric cardia region. The side-port is not seen but is felt to be above the diaphragm. There remain loops of dilated small bowel, similar to 1 day prior. No free air. There are phleboliths in the pelvis. IMPRESSION: Findings indicative of a  degree of persistent bowel obstruction. No free air. Nasogastric tube tip in proximal stomach with side port above the diaphragm. Advise advancing nasogastric tube approximately 8 cm to insure that side-port as well as nasogastric tube tip are within the stomach. Electronically Signed   By: Lowella Grip III M.D.   On: 04/19/2018 07:55   Ct Abdomen Pelvis W Contrast  Result Date: 04/17/2018 CLINICAL DATA:  Constipation, abdominal pain, vomiting EXAM: CT  ABDOMEN AND PELVIS WITH CONTRAST TECHNIQUE: Multidetector CT imaging of the abdomen and pelvis was performed using the standard protocol following bolus administration of intravenous contrast. CONTRAST:  192mL OMNIPAQUE IOHEXOL 300 MG/ML  SOLN COMPARISON:  None. FINDINGS: Lower chest: Bilateral breast implants partially imaged. Linear atelectasis or scarring at the right base. No effusions. Hepatobiliary: No focal hepatic abnormality. Gallbladder unremarkable. Pancreas: No focal abnormality or ductal dilatation. Spleen: No focal abnormality.  Normal size. Adrenals/Urinary Tract: No adrenal abnormality. No focal renal abnormality. No stones or hydronephrosis. Urinary bladder is unremarkable. Stomach/Bowel: Dilated fluid-filled small bowel loops with air-fluid levels noted throughout the abdomen and pelvis. Distal small bowel loops appear decompressed. Exact transition is not visualize, but findings compatible with moderate to high-grade partial small bowel obstruction. Large bowel decompressed, grossly unremarkable. Vascular/Lymphatic: No evidence of aneurysm or adenopathy. Reproductive: Prior hysterectomy.  No adnexal masses. Other: Small amount of free fluid in the pelvis.  No free air. Musculoskeletal: No acute bony abnormality. L5 related grade 1 anterolisthesis of to facet disease L4 on. Degenerative disc disease at L4-5. IMPRESSION: Findings compatible with moderate to high-grade partial small bowel obstruction. Exact transition and cause not visualized, presumably adhesions. Prior hysterectomy. Electronically Signed   By: Rolm Baptise M.D.   On: 04/17/2018 09:11   Dg Abd Portable 1v  Result Date: 04/22/2018 CLINICAL DATA:  Follow up small bowel obstruction EXAM: PORTABLE ABDOMEN - 1 VIEW COMPARISON:  04/21/2018 FINDINGS: Multiple dilated loops of small bowel are again identified roughly similar to that seen on the prior exam. The bladder is distended. No free air is seen. No acute bony abnormality is  noted. IMPRESSION: Stable small bowel dilatation. Electronically Signed   By: Inez Catalina M.D.   On: 04/22/2018 07:02   Dg Abd Portable 1v-small Bowel Obstruction Protocol-24 Hr Delay  Result Date: 04/18/2018 CLINICAL DATA:  Small-bowel obstruction, 24 hour delay EXAM: PORTABLE ABDOMEN - 1 VIEW COMPARISON:  Portable exam 1635 hours compared to 04/18/2018 FINDINGS: Tip of orogastric tube projects over proximal stomach though the proximal side-port projects over the distal esophagus; recommend advancing tube 7 cm. Small amount gas within stomach. Persistent significant gaseous distention of small bowel loops consistent with small bowel obstruction, small bowel measuring up to 4.2 cm transverse slightly less than the 4.6 cm on the prior exam. No colonic gas. Questionable mild wall thickening of a single small bowel loop in the LEFT upper quadrant. New osseous structures unremarkable. [name] bases clear. IMPRESSION: Persistent small bowel obstruction. Electronically Signed   By: Lavonia Dana M.D.   On: 04/18/2018 16:57   Dg Abd Portable 1v-small Bowel Obstruction Protocol-initial, 8 Hr Delay  Result Date: 04/18/2018 CLINICAL DATA:  Follow up small bowel obstruction. EXAM: PORTABLE ABDOMEN - 1 VIEW COMPARISON:  Abdominal radiograph April 17, 2018 FINDINGS: Persistent gas distended small bowel measuring to 4.2 cm. Paucity of large bowel gas. Nasogastric tube tip projects at proximal stomach. Phleboliths project in the pelvis. Soft tissue and osseous structures non suspicious. IMPRESSION: Unchanged small bowel obstruction. Nasogastric tube tip projects in proximal  stomach. Electronically Signed   By: Elon Alas M.D.   On: 04/18/2018 01:42   Dg Abd Portable 1v-small Bowel Protocol-position Verification  Result Date: 04/17/2018 CLINICAL DATA:  Confirm NG tube placement. EXAM: PORTABLE ABDOMEN - 1 VIEW COMPARISON:  04/17/2018. FINDINGS: Enteric tube is identified with tip 2.8 cm below the level of the GE  junction. The side port is 5 cm above the GE junction. Persistent small bowel dilatation compatible with obstruction. IMPRESSION: The tip of the NG tube projects over the proximal stomach just below the level of the GE junction and the side port is above the GE junction. Advise further tube advancement such that the side port is at least below the GE junction. Electronically Signed   By: Kerby Moors M.D.   On: 04/17/2018 10:50     Phillips Climes M.D on 04/23/2018 at 3:19 PM  Between 7am to 7pm - Pager - [phone]  After 7pm go to [email or web address] - password [facility]  [facility] -  Office  [phone]

## 2018-04-23 NOTE — Progress Notes (Signed)
Physical Therapy Treatment Patient Details Name: Bethany [name] MRN: [id number] DOB: Apr 15, 1956 Today's Date: 04/23/2018    History of Present Illness 62 year old female past medical history significant for osteoporosis with the right knee status post right total knee replacement on 04/12/2018 just discharged from our facility presents with worsening generalized abdominal pain.  She is found to have a partial small bowel obstruction likely related to inactivity and pain medications. s/p small bowel resection.    PT Comments    Patient in low spirits and abdominal pain but willing to participate with therapy. Noticeable decline since exploratory laparotomy yesterday. Patient walking 150 feet with walker very slowly with difficulty. Reviewed HEP for promote knee extension/flexion range and positioning for optimal knee extension.    Follow Up Recommendations  Home health PT;Supervision for mobility/OOB     Equipment Recommendations  None recommended by PT    Recommendations for Other Services       Precautions / Restrictions Precautions Precautions: Fall;Knee Restrictions Weight Bearing Restrictions: No RLE Weight Bearing: Weight bearing as tolerated    Mobility  Bed Mobility Overal bed mobility: Modified Independent             General bed mobility comments: increased time required   Transfers Overall transfer level: Modified independent Equipment used: Rolling walker (2 wheeled) Transfers: Sit to/from Stand Sit to Stand: Min guard Stand pivot transfers: Min guard       General transfer comment: practiced with not extending the right knee as far prior to transitioning from sit to stand to facilitate functional knee flexion  Ambulation/Gait Ambulation/Gait assistance: Supervision Gait Distance (Feet): 150 Feet Assistive device: Rolling walker (2 wheeled) Gait Pattern/deviations: Step-through pattern;Decreased weight shift to right;Step-to pattern Gait velocity:  decreased   General Gait Details: cues for heel strike at initial contact and toe off at terminal stance. patient initially utilizing step to pattern but able to progress to step through. Overall, very slow    Marine scientist [place] (Stroke Patients Only)       Balance                                            Cognition Arousal/Alertness: Awake/alert Behavior During Therapy: WFL for tasks assessed/performed Overall Cognitive Status: Within Functional Limits for tasks assessed                                        Exercises Total Joint Exercises Heel Slides: AROM;Right;Supine;10 reps Long Arc Quad: AROM;Right;10 reps;Seated Other Exercises Other Exercises: reviewed supine HEP  Other Exercises: placed pillow underneath ankle to promote knee extension while supine    General Comments        Pertinent Vitals/Pain Pain Assessment: 0-10 Pain Score: 9  Pain Location: R knee and abdomen Pain Descriptors / Indicators: Aching;Sore;Discomfort;Grimacing Pain Intervention(s): Patient requesting pain meds-RN notified;Limited activity within patient's tolerance;Monitored during session    Home Living                      Prior Function            PT Goals (current goals can now be found in the care plan section) Acute Rehab PT Goals  Patient Stated Goal: return home PT Goal Formulation: With patient Time For Goal Achievement: 05/02/18 Potential to Achieve Goals: Good    Frequency    Min 4X/week      PT Plan Current plan remains appropriate    Co-evaluation              AM-PAC PT "6 Clicks" Daily Activity  Outcome Measure  Difficulty turning over in bed (including adjusting bedclothes, sheets and blankets)?: None Difficulty moving from lying on back to sitting on the side of the bed? : None Difficulty sitting down on and standing up from a chair with arms (e.g.,  wheelchair, bedside commode, etc,.)?: None Help needed moving to and from a bed to chair (including a wheelchair)?: A Little Help needed walking in hospital room?: A Little Help needed climbing 3-5 steps with a railing? : A Little 6 Click Score: 21    End of Session Equipment Utilized During Treatment: Gait belt Activity Tolerance: Patient tolerated treatment well Patient left: in bed;with call bell/phone within reach Nurse Communication: Mobility status PT Visit Diagnosis: Pain;Difficulty in walking, not elsewhere classified (R26.2);Muscle weakness (generalized) (M62.81) Pain - Right/Left: Right Pain - part of body: Knee     Time: 2820-8138 PT Time Calculation (min) (ACUTE ONLY): 46 min  Charges:  $Gait Training: 23-37 mins $Therapeutic Exercise: 8-22 mins                    G Codes:       Ellamae Sia, PT, DPT Acute Rehabilitation Services  Pager: [phone]    Willy Eddy 04/23/2018, 1:57 PM

## 2018-04-23 NOTE — Progress Notes (Signed)
[facility]:  [phone] General Surgery Progress Note   LOS: 6 days  POD -  1 Day Post-Op  Chief Complaint: abdominal pain  Assessment and Plan: 1.  EXPLORATORY LAPAROTOMY, enterolysis, sb resection - 04/22/2018  WBC - 16,300 - 04/23/2018  Continue NGT, increase IVF, needs to ambulate  2.  S/P right knee replacement - 04/12/2018 - [place] 3.  DVT prophylaxis - Lovenox 4.  D/C foley   Principal Problem:   SBO (small bowel obstruction) (HCC) Active Problems:   Raynaud's disease   Recurrent herpes labialis   Mood disorder (HCC)   Tubular adenoma of colon   Primary osteoarthritis of right knee   IBS (irritable bowel syndrome)  Subjective:  Miserable.  2nd major operation in a couple of weeks. Discussed surgical findings.  She tried to call husband, but he did not answer phone.  Objective:   Vitals:   04/23/18 0135 04/23/18 0457  BP: (!) 162/88 (!) 141/81  Pulse: 87 80  Resp: 16 16  Temp: 97.6 F (36.4 C) 97.9 F (36.6 C)  SpO2: 100% 100%     Intake/Output from previous day:  06/21 0701 - 06/22 0700 In: 3367.6 [I.V.:2837.6; NG/GT:30; IV Piggyback:500] Out: 0177 [Urine:1265; Emesis/NG output:850; Blood:15]  Intake/Output this shift:  No intake/output data recorded.   Physical Exam:   General: WF who is alert and oriented.    HEENT: Normal. Pupils equal. .   Lungs: Clear   Abdomen: Quiet.   Wound: Okay   Lab Results:    Recent Labs    04/22/18 0702 04/23/18 0440  WBC 10.4 16.3*  HGB 11.7* 12.3  HCT 36.4 36.9  PLT 312 337    BMET   Recent Labs    04/22/18 0702 04/23/18 0440  NA 144 140  K 3.9 4.1  CL 109 104  CO2 29 31  GLUCOSE 102* 136*  BUN 13 9  CREATININE 0.84 0.72  CALCIUM 8.8* 8.3*    PT/INR  No results for input(s): LABPROT, INR in the last 72 hours.  ABG  No results for input(s): PHART, HCO3 in the last 72 hours.  Invalid input(s): PCO2, PO2   Studies/Results:  Dg Abd Portable 1v  Result Date:  04/22/2018 CLINICAL DATA:  Follow up small bowel obstruction EXAM: PORTABLE ABDOMEN - 1 VIEW COMPARISON:  04/21/2018 FINDINGS: Multiple dilated loops of small bowel are again identified roughly similar to that seen on the prior exam. The bladder is distended. No free air is seen. No acute bony abnormality is noted. IMPRESSION: Stable small bowel dilatation. Electronically Signed   By: Inez Catalina M.D.   On: 04/22/2018 07:02     Anti-infectives:   Anti-infectives (From admission, onward)   None      Alphonsa Overall, MD, FACS Pager: [facility]: [phone] 04/23/2018

## 2018-04-24 ENCOUNTER — Inpatient Hospital Stay: Payer: Self-pay

## 2018-04-24 ENCOUNTER — Inpatient Hospital Stay (HOSPITAL_COMMUNITY): Payer: 59

## 2018-04-24 LAB — BASIC METABOLIC PANEL
Anion gap: 8 (ref 5–15)
BUN: 9 mg/dL (ref 6–20)
CALCIUM: 8.9 mg/dL (ref 8.9–10.3)
CO2: 31 mmol/L (ref 22–32)
CREATININE: 0.8 mg/dL (ref 0.44–1.00)
Chloride: 101 mmol/L (ref 101–111)
GFR calc Af Amer: >60 mL/min (ref >60)
GFR calc non Af Amer: >60 mL/min (ref >60)
Glucose, Bld: 134 mg/dL — ABNORMAL HIGH (ref 65–99)
Potassium: 3.8 mmol/L (ref 3.5–5.1)
Sodium: 140 mmol/L (ref 135–145)

## 2018-04-24 LAB — URINALYSIS, ROUTINE W REFLEX MICROSCOPIC
BACTERIA UA: NONE SEEN
Bilirubin Urine: NEGATIVE
GLUCOSE, UA: NEGATIVE mg/dL
Hgb urine dipstick: NEGATIVE
KETONES UR: NEGATIVE mg/dL
LEUKOCYTES UA: NEGATIVE
NITRITE: NEGATIVE
PROTEIN: NEGATIVE mg/dL
Specific Gravity, Urine: 1.012 (ref 1.005–1.030)
pH: 8 (ref 5.0–8.0)

## 2018-04-24 LAB — CBC WITH DIFFERENTIAL/PLATELET
Abs Immature Granulocytes: 0.2 10*3/uL — ABNORMAL HIGH (ref 0.0–0.1)
Basophils Absolute: 0.1 10*3/uL (ref 0.0–0.1)
Basophils Relative: 0 %
Eosinophils Absolute: 0.3 10*3/uL (ref 0.0–0.7)
Eosinophils Relative: 1 %
HCT: 40 % (ref 36.0–46.0)
HEMOGLOBIN: 12.6 g/dL (ref 12.0–15.0)
IMMATURE GRANULOCYTES: 1 %
LYMPHS ABS: 1.6 10*3/uL (ref 0.7–4.0)
LYMPHS PCT: 7 %
MCH: 33.2 pg (ref 26.0–34.0)
MCHC: 31.5 g/dL (ref 30.0–36.0)
MCV: 105.3 fL — ABNORMAL HIGH (ref 78.0–100.0)
Monocytes Absolute: 1 10*3/uL (ref 0.1–1.0)
Monocytes Relative: 5 %
NEUTROS ABS: 18.7 10*3/uL — AB (ref 1.7–7.7)
Neutrophils Relative %: 86 %
Platelets: 428 10*3/uL — ABNORMAL HIGH (ref 150–400)
RBC: 3.8 MIL/uL — AB (ref 3.87–5.11)
RDW: 12.7 % (ref 11.5–15.5)
WBC: 21.9 10*3/uL — ABNORMAL HIGH (ref 4.0–10.5)

## 2018-04-24 LAB — COMPREHENSIVE METABOLIC PANEL
ALBUMIN: 3.1 g/dL — AB (ref 3.5–5.0)
ALK PHOS: 67 U/L (ref 38–126)
ALT: 21 U/L (ref 14–54)
ANION GAP: 8 (ref 5–15)
AST: 29 U/L (ref 15–41)
BUN: 8 mg/dL (ref 6–20)
CALCIUM: 8.8 mg/dL — AB (ref 8.9–10.3)
CO2: 31 mmol/L (ref 22–32)
Chloride: 99 mmol/L — ABNORMAL LOW (ref 101–111)
Creatinine, Ser: 0.8 mg/dL (ref 0.44–1.00)
GFR calc Af Amer: >60 mL/min (ref >60)
GLUCOSE: 126 mg/dL — AB (ref 65–99)
Potassium: 3.9 mmol/L (ref 3.5–5.1)
Sodium: 138 mmol/L (ref 135–145)
Total Bilirubin: 0.8 mg/dL (ref 0.3–1.2)
Total Protein: 6 g/dL — ABNORMAL LOW (ref 6.5–8.1)

## 2018-04-24 LAB — PHOSPHORUS: Phosphorus: 3.4 mg/dL (ref 2.5–4.6)

## 2018-04-24 LAB — GLUCOSE, CAPILLARY: GLUCOSE-CAPILLARY: 92 mg/dL (ref 65–99)

## 2018-04-24 LAB — MAGNESIUM: MAGNESIUM: 1.8 mg/dL (ref 1.7–2.4)

## 2018-04-24 LAB — TRIGLYCERIDES: TRIGLYCERIDES: 156 mg/dL — AB (ref <150)

## 2018-04-24 MED ORDER — SODIUM CHLORIDE 0.9% FLUSH
10.0000 mL | Freq: Two times a day (BID) | INTRAVENOUS | Status: DC
  Administered 2018-04-25 – 2018-04-30 (×5): 10 mL

## 2018-04-24 MED ORDER — SODIUM CHLORIDE 0.9% FLUSH
10.0000 mL | INTRAVENOUS | Status: DC | PRN
  Administered 2018-04-28 (×2): 10 mL
  Administered 2018-04-30: 20 mL
  Filled 2018-04-24 (×3): qty 40

## 2018-04-24 MED ORDER — KCL-LACTATED RINGERS-D5W 20 MEQ/L IV SOLN
INTRAVENOUS | Status: AC
  Administered 2018-04-24 – 2018-04-25 (×2): via INTRAVENOUS
  Filled 2018-04-24 (×3): qty 1000

## 2018-04-24 MED ORDER — POTASSIUM CHLORIDE 10 MEQ/50ML IV SOLN
10.0000 meq | Freq: Once | INTRAVENOUS | Status: AC
  Administered 2018-04-24: 10 meq via INTRAVENOUS
  Filled 2018-04-24: qty 50

## 2018-04-24 MED ORDER — INSULIN ASPART 100 UNIT/ML ~~LOC~~ SOLN
0.0000 [IU] | SUBCUTANEOUS | Status: DC
  Administered 2018-04-25 (×2): 1 [IU] via SUBCUTANEOUS

## 2018-04-24 MED ORDER — TRAVASOL 10 % IV SOLN
INTRAVENOUS | Status: AC
  Administered 2018-04-24: 17:00:00 via INTRAVENOUS
  Filled 2018-04-24: qty 336

## 2018-04-24 MED ORDER — MAGNESIUM SULFATE IN D5W 1-5 GM/100ML-% IV SOLN
1.0000 g | Freq: Once | INTRAVENOUS | Status: AC
  Administered 2018-04-24: 1 g via INTRAVENOUS
  Filled 2018-04-24: qty 100

## 2018-04-24 NOTE — Progress Notes (Signed)
PROGRESS NOTE                                                                                                                                                                                                             Patient Demographics:    Bethany [name], is a 62 y.o. female, DOB - 06-Jan-1956, ZOX:[id number]  Admit date - 04/17/2018   Admitting Physician Lady Deutscher, MD  Outpatient Primary MD for the patient is Denita Lung, MD  LOS - 7   Chief Complaint  Patient presents with  . Constipation  . Abdominal Pain       Brief Narrative   Bethany [name] is a 62 year old female with past medical history significant for osteoarthritis of the right knee status post right total knee replacement on 04/12/2018 who presented with worsening generalized abdominal pain.  CT abdomen pelvis with contrast done on 04/17/2018 revealed moderate to high-grade partial small bowel obstruction.  Prior hysterectomy noted on imaging.  Recently on opiates however stated that she was taking laxative and stool softener along with her pain medications.  History of tubular adenoma of colon one year ago.  Admitted for partial small bowel obstruction.    No improvement with NG tube n.p.o. with IV fluids, pain been going for exploratory protamine where she did have distal ileum obstruction status post resection, postoperative course complicated by ileus  Subjective:    Bethany [name] today denies any fever or chills, did not pass gas or any bowel movement overnight  Assessment  & Plan :    Principal Problem:   SBO (small bowel obstruction) (HCC) Active Problems:   Raynaud's disease   Recurrent herpes labialis   Mood disorder (HCC)   Tubular adenoma of colon   Primary osteoarthritis of right knee   IBS (irritable bowel syndrome)    Partial small bowel obstruction. - With initial improvement after NGT insertion, but with recurrent evidence of small bowel obstruction on  imaging, unfortunately after discontinuing her NG tube she had recurrence of symptoms, so general surgery input greatly appreciated, status post exploratory laparotomy 04/22/2018 with lysis of adhesions and small bowel resection for chronic stenosis distal ileum secondary to long-standing adhesions . -Appears to be having postoperative ileus, continue with NGT on suction, PICC line will be inserted and she will be started on TPN. -She continues to  have worsening leukocytosis, she remains afebrile, will continue to monitor closely, I will check two-view chest x-ray and urine analysis.  Status post right total knee replacement  - appears stable , continue with as needed pain medication, continue with PT and ambulation Weightbearing as tolerated as recommended by orthopedic surgery Pain management with IV Toradol due to partial small bowel obstruction  Hyperlipidemia - Continue to hold statin due to n.p.o.  hypokalemia - repleted,   Hypernatremia -Resolved,   Chronic macrocytic anemia - Hemoglobin 10.7; baseline hemoglobin 12 - MCV 101.9, b12 WnL follow on folic acid  Ambulatory dysfunction due to recent R knee surgery - Continue PT as recommended by orthopedic surgery     Code Status : Full  Family Communication  : None at bedside  Disposition Plan  : Home when stable  Consults  :  General surgery  Procedures  : none  DVT Prophylaxis  :  [place] lovenox  Lab Results  Component Value Date   PLT 428 (H) 04/24/2018    Antibiotics  :    Anti-infectives (From admission, onward)   None        Objective:   Vitals:   04/23/18 2137 04/24/18 0500 04/24/18 0532 04/24/18 1410  BP: 129/76  138/79 138/76  Pulse: 88  (!) 124 89  Resp: 17  16 16   Temp: 98.1 F (36.7 C)  (!) 97.4 F (36.3 C) 98.3 F (36.8 C)  TempSrc: Oral  Oral Oral  SpO2: 97%  (!) 89% 92%  Weight:  65.7 kg (144 lb 13.5 oz)    Height:        Wt Readings from Last 3 Encounters:  04/24/18 65.7 kg  (144 lb 13.5 oz)  04/12/18 55.1 kg (121 lb 6.4 oz)  03/31/18 55.1 kg (121 lb 6.4 oz)     Intake/Output Summary (Last 24 hours) at 04/24/2018 1445 Last data filed at 04/24/2018 1230 Gross per 24 hour  Intake 804.55 ml  Output 1900 ml  Net -1095.45 ml     Physical Exam  Awake Alert, Oriented X 3, No new F.N deficits, Normal affect Symmetrical Chest wall movement, Good air movement bilaterally, CTAB RRR,No Gallops,Rubs or new Murmurs, No Parasternal Heave +ve B.Sounds, but diminished, Abd Soft, vertical wound covered with honeycomb mesh small amount of dried blood, no rebound - guarding or rigidity. No Cyanosis, Clubbing or edema, No new Rash or bruise       Data Review:    CBC Recent Labs  Lab 04/20/18 0605 04/21/18 0654 04/22/18 0702 04/23/18 0440 04/24/18 0525  WBC 7.4 8.5 10.4 16.3* 21.9*  HGB 9.9* 10.8* 11.7* 12.3 12.6  HCT 30.8* 34.0* 36.4 36.9 40.0  PLT 226 273 312 337 428*  MCV 103.0* 104.3* 105.2* 100.8* 105.3*  MCH 33.1 33.1 33.8 33.6 33.2  MCHC 32.1 31.8 32.1 33.3 31.5  RDW 12.5 12.5 12.6 12.3 12.7  LYMPHSABS  --   --   --   --  1.6  MONOABS  --   --   --   --  1.0  EOSABS  --   --   --   --  0.3  BASOSABS  --   --   --   --  0.1    Chemistries  Recent Labs  Lab 04/20/18 0605 04/21/18 0654 04/22/18 0702 04/23/18 0440 04/24/18 0525  NA 148* 145 144 140 138  140  K 3.1* 3.8 3.9 4.1 3.9  3.8  CL 110 111 109 104 99*  101  CO2 33* 30 29 31 31  31   GLUCOSE 137* 109* 102* 136* 126*  134*  BUN 20 14 13 9 8  9   CREATININE 0.77 0.67 0.84 0.72 0.80  0.80  CALCIUM 8.8* 8.6* 8.8* 8.3* 8.8*  8.9  MG 1.8 1.8 1.9  --  1.8  AST  --   --   --   --  29  ALT  --   --   --   --  21  ALKPHOS  --   --   --   --  67  BILITOT  --   --   --   --  0.8   ------------------------------------------------------------------------------------------------------------------ Recent Labs    04/24/18 0525  TRIG 156*    No results found for:  HGBA1C ------------------------------------------------------------------------------------------------------------------ No results for input(s): TSH, T4TOTAL, T3FREE, THYROIDAB in the last 72 hours.  Invalid input(s): FREET3 ------------------------------------------------------------------------------------------------------------------ No results for input(s): VITAMINB12, FOLATE, FERRITIN, TIBC, IRON, RETICCTPCT in the last 72 hours.  Coagulation profile No results for input(s): INR, PROTIME in the last 168 hours.  No results for input(s): DDIMER in the last 72 hours.  Cardiac Enzymes No results for input(s): CKMB, TROPONINI, MYOGLOBIN in the last 168 hours.  Invalid input(s): CK ------------------------------------------------------------------------------------------------------------------ No results found for: BNP  Inpatient Medications  Scheduled Meds: . enoxaparin (LOVENOX) injection  40 mg Subcutaneous Q24H  . insulin aspart  0-9 Units Subcutaneous Q4H  . mouth rinse  15 mL Mouth Rinse BID  . sodium chloride flush  10-40 mL Intracatheter Q12H   Continuous Infusions: . dextrose 5% lactated ringers with KCl 20 mEq/L 20 mL/hr at 04/24/18 1250  . dextrose 5% lactated ringers with KCl 20 mEq/L    . magnesium sulfate 1 - 4 g bolus IVPB 1 g (04/24/18 1351)  . potassium chloride 10 mEq (04/24/18 1349)  . TPN ADULT (ION)     PRN Meds:.acetaminophen **OR** acetaminophen, bisacodyl, fentaNYL (SUBLIMAZE) injection, HYDROmorphone (DILAUDID) injection, LORazepam, ondansetron **OR** ondansetron (ZOFRAN) IV, phenol, sodium chloride flush  Micro Results No results found for this or any previous visit (from the past 240 hour(s)).  Radiology Reports Dg Chest 2 View  Result Date: 04/01/2018 CLINICAL DATA:  Preoperative for right total knee arthroplasty. EXAM: CHEST - 2 VIEW COMPARISON:  None. FINDINGS: Normal heart size. Normal mediastinal contour. No pneumothorax. No pleural  effusion. Lungs appear clear, with no acute consolidative airspace disease and no pulmonary edema. IMPRESSION: No active cardiopulmonary disease. Electronically Signed   By: Ilona Sorrel M.D.   On: 04/01/2018 08:15   Dg Abd 1 View  Result Date: 04/21/2018 CLINICAL DATA:  Small bowel obstruction. EXAM: ABDOMEN - 1 VIEW COMPARISON:  Radiographs of April 20, 2018. FINDINGS: Stable small bowel dilatation is noted concerning for distal small bowel obstruction. No colonic dilatation is noted. Phleboliths are noted in the pelvis. IMPRESSION: Stable small bowel dilatation concerning for distal small bowel obstruction. Electronically Signed   By: Marijo Conception, M.D.   On: 04/21/2018 08:38   Dg Abd 1 View  Result Date: 04/20/2018 CLINICAL DATA:  Evaluate for small bowel obstruction and NG tube placement EXAM: ABDOMEN - 1 VIEW COMPARISON:  04/19/2018 FINDINGS: NG tube tip is in the distal esophagus with the side port in the mid esophagus. This should be advanced several cm. Small bowel dilatation noted in the abdomen compatible with small bowel obstruction as seen on prior study. Bibasilar atelectasis. IMPRESSION: NG tube tip in the distal esophagus. Recommend advancing several cm. Small bowel obstruction  pattern, stable. Electronically Signed   By: Rolm Baptise M.D.   On: 04/20/2018 08:16   Dg Abd 1 View  Result Date: 04/19/2018 CLINICAL DATA:  Small bowel obstruction. EXAM: ABDOMEN - 1 VIEW COMPARISON:  CT abdomen and pelvis April 17, 2018; abdominal radiograph April 18, 2018 FINDINGS: Nasogastric tube tip is in the gastric cardia region. The side-port is not seen but is felt to be above the diaphragm. There remain loops of dilated small bowel, similar to 1 day prior. No free air. There are phleboliths in the pelvis. IMPRESSION: Findings indicative of a degree of persistent bowel obstruction. No free air. Nasogastric tube tip in proximal stomach with side port above the diaphragm. Advise advancing nasogastric tube  approximately 8 cm to insure that side-port as well as nasogastric tube tip are within the stomach. Electronically Signed   By: Lowella Grip III M.D.   On: 04/19/2018 07:55   Ct Abdomen Pelvis W Contrast  Result Date: 04/17/2018 CLINICAL DATA:  Constipation, abdominal pain, vomiting EXAM: CT ABDOMEN AND PELVIS WITH CONTRAST TECHNIQUE: Multidetector CT imaging of the abdomen and pelvis was performed using the standard protocol following bolus administration of intravenous contrast. CONTRAST:  126mL OMNIPAQUE IOHEXOL 300 MG/ML  SOLN COMPARISON:  None. FINDINGS: Lower chest: Bilateral breast implants partially imaged. Linear atelectasis or scarring at the right base. No effusions. Hepatobiliary: No focal hepatic abnormality. Gallbladder unremarkable. Pancreas: No focal abnormality or ductal dilatation. Spleen: No focal abnormality.  Normal size. Adrenals/Urinary Tract: No adrenal abnormality. No focal renal abnormality. No stones or hydronephrosis. Urinary bladder is unremarkable. Stomach/Bowel: Dilated fluid-filled small bowel loops with air-fluid levels noted throughout the abdomen and pelvis. Distal small bowel loops appear decompressed. Exact transition is not visualize, but findings compatible with moderate to high-grade partial small bowel obstruction. Large bowel decompressed, grossly unremarkable. Vascular/Lymphatic: No evidence of aneurysm or adenopathy. Reproductive: Prior hysterectomy.  No adnexal masses. Other: Small amount of free fluid in the pelvis.  No free air. Musculoskeletal: No acute bony abnormality. L5 related grade 1 anterolisthesis of to facet disease L4 on. Degenerative disc disease at L4-5. IMPRESSION: Findings compatible with moderate to high-grade partial small bowel obstruction. Exact transition and cause not visualized, presumably adhesions. Prior hysterectomy. Electronically Signed   By: Rolm Baptise M.D.   On: 04/17/2018 09:11   Dg Abd Portable 1v  Result Date:  04/22/2018 CLINICAL DATA:  Follow up small bowel obstruction EXAM: PORTABLE ABDOMEN - 1 VIEW COMPARISON:  04/21/2018 FINDINGS: Multiple dilated loops of small bowel are again identified roughly similar to that seen on the prior exam. The bladder is distended. No free air is seen. No acute bony abnormality is noted. IMPRESSION: Stable small bowel dilatation. Electronically Signed   By: Inez Catalina M.D.   On: 04/22/2018 07:02   Dg Abd Portable 1v-small Bowel Obstruction Protocol-24 Hr Delay  Result Date: 04/18/2018 CLINICAL DATA:  Small-bowel obstruction, 24 hour delay EXAM: PORTABLE ABDOMEN - 1 VIEW COMPARISON:  Portable exam 1635 hours compared to 04/18/2018 FINDINGS: Tip of orogastric tube projects over proximal stomach though the proximal side-port projects over the distal esophagus; recommend advancing tube 7 cm. Small amount gas within stomach. Persistent significant gaseous distention of small bowel loops consistent with small bowel obstruction, small bowel measuring up to 4.2 cm transverse slightly less than the 4.6 cm on the prior exam. No colonic gas. Questionable mild wall thickening of a single small bowel loop in the LEFT upper quadrant. New osseous structures unremarkable. Lung bases clear. IMPRESSION:  Persistent small bowel obstruction. Electronically Signed   By: Lavonia Dana M.D.   On: 04/18/2018 16:57   Dg Abd Portable 1v-small Bowel Obstruction Protocol-initial, 8 Hr Delay  Result Date: 04/18/2018 CLINICAL DATA:  Follow up small bowel obstruction. EXAM: PORTABLE ABDOMEN - 1 VIEW COMPARISON:  Abdominal radiograph April 17, 2018 FINDINGS: Persistent gas distended small bowel measuring to 4.2 cm. Paucity of large bowel gas. Nasogastric tube tip projects at proximal stomach. Phleboliths project in the pelvis. Soft tissue and osseous structures non suspicious. IMPRESSION: Unchanged small bowel obstruction. Nasogastric tube tip projects in proximal stomach. Electronically Signed   By: Elon Alas M.D.   On: 04/18/2018 01:42   Dg Abd Portable 1v-small Bowel Protocol-position Verification  Result Date: 04/17/2018 CLINICAL DATA:  Confirm NG tube placement. EXAM: PORTABLE ABDOMEN - 1 VIEW COMPARISON:  04/17/2018. FINDINGS: Enteric tube is identified with tip 2.8 cm below the level of the GE junction. The side port is 5 cm above the GE junction. Persistent small bowel dilatation compatible with obstruction. IMPRESSION: The tip of the NG tube projects over the proximal stomach just below the level of the GE junction and the side port is above the GE junction. Advise further tube advancement such that the side port is at least below the GE junction. Electronically Signed   By: Kerby Moors M.D.   On: 04/17/2018 10:50   [place] Ekg Site Rite  Result Date: 04/24/2018 If Site Rite image not attached, placement could not be confirmed due to current cardiac rhythm.    Phillips Climes M.D on 04/24/2018 at 2:45 PM  Between 7am to 7pm - Pager - [phone]  After 7pm go to [email or web address] - password [facility]  [facility] -  Office  [phone]

## 2018-04-24 NOTE — Progress Notes (Signed)
Physical Therapy Treatment Patient Details Name: Bethany [name] MRN: [id number] DOB: 07-Apr-1956 Today's Date: 04/24/2018    History of Present Illness 62 year old female past medical history significant for osteoporosis with the right knee status post right total knee replacement on 04/12/2018 just discharged from our facility presents with worsening generalized abdominal pain.  She is found to have a partial small bowel obstruction likely related to inactivity and pain medications.    PT Comments    Patient groggy and required slightly more assist/guarding today for safety. Initially refused to participate and then talked herself into getting OOB as she realized how important it is for her to move more. Continue to cue pt to keep rt foot underneath her more during sit to stand or stand to sit.     Follow Up Recommendations  Home health PT;Supervision for mobility/OOB     Equipment Recommendations  None recommended by PT    Recommendations for Other Services       Precautions / Restrictions Precautions Precautions: Fall;Knee Restrictions Weight Bearing Restrictions: No RLE Weight Bearing: Weight bearing as tolerated    Mobility  Bed Mobility Overal bed mobility: Needs Assistance       Supine to sit: Supervision;HOB elevated(multiple lines; groggy)     General bed mobility comments: increased time required   Transfers Overall transfer level: Needs assistance Equipment used: Rolling walker (2 wheeled) Transfers: Sit to/from Stand Sit to Stand: Min guard;Min assist Stand pivot transfers: Min guard       General transfer comment: practiced with not extending the right knee as far prior to transitioning from sit to stand to facilitate functional knee flexion(x2 reps)  Ambulation/Gait Ambulation/Gait assistance: Min guard Gait Distance (Feet): 250 Feet Assistive device: Rolling walker (2 wheeled) Gait Pattern/deviations: Step-through pattern;Decreased weight shift to  right;Step-to pattern Gait velocity: decreased   General Gait Details: cues for directing walker (tending to drift to her left unaware and bumped into furniture x 2); vc for heelstrike and knee extension   Stairs             Wheelchair Mobility    Modified Rankin (Stroke Patients Only)       Balance                                            Cognition Arousal/Alertness: Lethargic;Suspect due to medications Behavior During Therapy: [facility] for tasks assessed/performed Overall Cognitive Status: Impaired/Different from baseline                                 General Comments: groggy; decr memory; word-finding issues      Exercises Total Joint Exercises Ankle Circles/Pumps: AROM;Right;5 reps;Seated Quad Sets: AROM;Right;Seated(foot propped on therapist's knee) Long Arc Quad: AROM;Right;10 reps;Seated Knee Flexion: AAROM;Right;Seated;10 reps    General Comments        Pertinent Vitals/Pain Pain Assessment: Faces Faces Pain Scale: Hurts a little bit Pain Location: R knee and abdomen(equally) Pain Descriptors / Indicators: Aching;Sore;Discomfort;Grimacing;Guarding;Operative site guarding Pain Intervention(s): Limited activity within patient's tolerance;Monitored during session    Home Living                      Prior Function            PT Goals (current goals can now be found in the care plan  section) Acute Rehab PT Goals Patient Stated Goal: return home PT Goal Formulation: With patient Time For Goal Achievement: 05/02/18 Potential to Achieve Goals: Good Progress towards PT goals: Progressing toward goals    Frequency    Min 4X/week      PT Plan Current plan remains appropriate    Co-evaluation              AM-PAC PT "6 Clicks" Daily Activity  Outcome Measure  Difficulty turning over in bed (including adjusting bedclothes, sheets and blankets)?: None Difficulty moving from lying on back to  sitting on the side of the bed? : A Little Difficulty sitting down on and standing up from a chair with arms (e.g., wheelchair, bedside commode, etc,.)?: A Little Help needed moving to and from a bed to chair (including a wheelchair)?: A Little Help needed walking in hospital room?: A Little Help needed climbing 3-5 steps with a railing? : A Little 6 Click Score: 19    End of Session   Activity Tolerance: Patient tolerated treatment well Patient left: with call bell/phone within reach;in chair;with family/visitor present(husband present and verbalized need to call nsg if leaves)   PT Visit Diagnosis: Pain;Difficulty in walking, not elsewhere classified (R26.2);Muscle weakness (generalized) (M62.81) Pain - Right/Left: Right Pain - part of body: Knee     Time: 8416-6063 PT Time Calculation (min) (ACUTE ONLY): 38 min  Charges:  $Gait Training: 23-37 mins $Therapeutic Exercise: 8-22 mins                    G Codes:          [employer], PT 04/24/2018, 3:58 PM

## 2018-04-24 NOTE — Progress Notes (Signed)
[facility] NOTE   Pharmacy Consult for TPN Indication: prolonged ileus  Patient Measurements: Height: 5\' 5"  (165.1 cm) Weight: 144 lb 13.5 oz (65.7 kg) IBW/kg (Calculated) : 57 TPN AdjBW (KG): 54.9 Body mass index is 24.1 kg/m. Usual Weight: 55 kg  Assessment:  62 y/o female who had right TKR on 04/12/18 who presented to the ED on 04/17/18 with constipation, abdominal pain, and vomiting found to have a pSBO likely from chronic constipation in setting of opiate use vs adhesions. NGT placed and bowel rest initiated but she failed medical management and is s/p ex lap, LOA, and SBR on 04/22/18. She has had minimal nutrition over the last 9-10 days. Pharmacy consulted to begin TPN for ileus.  GI: post-op ileus. NGT output 77ml. LBM 6/19 Endo: no hx DM Insulin requirements in the past 24 hours: none Lytes: Mg 1.8 (goal >=2 for ileus), K 3.9 (goal >=4 for ileus) Renal: UOP 0.9 ml/kg/hr. Net +2L since admit. D5LR + 20K at 125 ml/hr Pulm: RA Cards: BP nml, tachy Hepatobil: LFT wnl, TG 156 Neuro: no issue ID: WBC elevated - no abx  TPN Access: double lumen PICC 6/23 TPN start date: 6/23 >> Nutritional Goals (pending RD recommendation): KCal: 1625-1950 Protein: 65-75 g Fluid: 2L  Goal TPN rate is 83 ml/hr (provides 70 g protein, 294 g dextrose, and 49 g lipids)  Current Nutrition:  NPO  Plan:  Start TPN at 40 mL/hr. This TPN provides 33 g of protein, 144 g of dextrose, and 23 g of lipids which provides 859 kCals per day, meeting 52% of patient needs Electrolytes in TPN: standard Add MVI, trace elements Add sensitive SSI and adjust as needed Decrease IVMF (D5LR + 20K) to 85 ml/hr at 18:00 Monitor TPN labs  KCl 10 meq IV once Mg 2 g IV once   Renold Genta, PharmD, BCPS Clinical Pharmacist Clinical phone for 04/24/2018 until 3p is [phone] Please check AMION for all Pharmacist numbers by unit 04/24/2018 8:51 AM

## 2018-04-24 NOTE — Progress Notes (Signed)
Peripherally Inserted Central Catheter/Midline Placement  The IV Nurse has discussed with the patient and/or persons authorized to consent for the patient, the purpose of this procedure and the potential benefits and risks involved with this procedure.  The benefits include less needle sticks, lab draws from the catheter, and the patient may be discharged home with the catheter. Risks include, but not limited to, infection, bleeding, blood clot (thrombus formation), and puncture of an artery; nerve damage and irregular heartbeat and possibility to perform a PICC exchange if needed/ordered by physician.  Alternatives to this procedure were also discussed.  Bard Power PICC patient education guide, fact sheet on infection prevention and patient information card has been provided to patient /or left at bedside.    PICC/Midline Placement Documentation  PICC Double Lumen 04/24/18 PICC Right Cephalic 33 cm 0 cm (Active)  Indication for Insertion or Continuance of Line Administration of hyperosmolar/irritating solutions (i.e. TPN, Vancomycin, etc.) 04/24/2018 12:16 PM  Exposed Catheter (cm) 0 cm 04/24/2018 12:16 PM  Site Assessment Clean;Dry;Intact 04/24/2018 12:16 PM  Lumen #1 Status Flushed;Saline locked;Blood return noted 04/24/2018 12:16 PM  Lumen #2 Status Flushed;Saline locked;Blood return noted 04/24/2018 12:16 PM  Dressing Type Transparent 04/24/2018 12:16 PM  Dressing Status Clean;Dry;Intact;Antimicrobial disc in place 04/24/2018 12:16 PM  Line Care Connections checked and tightened 04/24/2018 12:16 PM  Line Adjustment (NICU/IV Team Only) No 04/24/2018 12:16 PM  Dressing Intervention New dressing 04/24/2018 12:16 PM  Dressing Change Due 05/01/18 04/24/2018 12:16 PM       Rolena Infante 04/24/2018, 12:17 PM

## 2018-04-24 NOTE — Progress Notes (Signed)
[facility] Office:  [phone] General Surgery Progress Note   LOS: 7 days  POD -  2 Days Post-Op  Chief Complaint: abdominal pain  Assessment and Plan: 1.  EXPLORATORY LAPAROTOMY, enterolysis, sb resection - 04/22/2018  WBC - 22, will follow  Continue NGT, IVF  needs to ambulate  2.  S/P right knee replacement - 04/12/2018 - [facility]orf 3.  DVT prophylaxis - Lovenox 4.  Min nutrition over the last 9-10 days.  Now with post op ileus.  Will start TPN and place PICC   Principal Problem:   SBO (small bowel obstruction) (HCC) Active Problems:   Raynaud's disease   Recurrent herpes labialis   Mood disorder (HCC)   Tubular adenoma of colon   Primary osteoarthritis of right knee   IBS (irritable bowel syndrome)  Subjective:  Miserable.  2nd major operation in a couple of weeks.   Objective:   Vitals:   04/23/18 2137 04/24/18 0532  BP: 129/76 138/79  Pulse: 88 (!) 124  Resp: 17 16  Temp: 98.1 F (36.7 C) (!) 97.4 F (36.3 C)  SpO2: 97% (!) 89%     Intake/Output from previous day:  06/22 0701 - 06/23 0700 In: 1519.5 [P.O.:120; I.V.:1379.5; NG/GT:20] Out: 2200 [Urine:1450; Emesis/NG output:750]  Intake/Output this shift:  Total I/O In: -  Out: 200 [Urine:200]   Physical Exam:   General: WF who is alert and oriented.    HEENT: Normal. Pupils equal. .     Abdomen: soft.   Wound: Okay   Lab Results:    Recent Labs    04/23/18 0440 04/24/18 0525  WBC 16.3* 21.9*  HGB 12.3 12.6  HCT 36.9 40.0  PLT 337 428*    BMET   Recent Labs    04/23/18 0440 04/24/18 0525  NA 140 140  K 4.1 3.8  CL 104 101  CO2 31 31  GLUCOSE 136* 134*  BUN 9 9  CREATININE 0.72 0.80  CALCIUM 8.3* 8.9    PT/INR  No results for input(s): LABPROT, INR in the last 72 hours.  ABG  No results for input(s): PHART, HCO3 in the last 72 hours.  Invalid input(s): PCO2, PO2   Studies/Results:  [place] Ekg Site Rite  Result Date: 04/24/2018 If Site Rite image not  attached, placement could not be confirmed due to current cardiac rhythm.    Anti-infectives:   Anti-infectives (From admission, onward)   None       Rosario Adie, MD  [facility]

## 2018-04-25 ENCOUNTER — Encounter (HOSPITAL_COMMUNITY): Payer: Self-pay | Admitting: Surgery

## 2018-04-25 LAB — COMPREHENSIVE METABOLIC PANEL
ALT: 17 U/L (ref 14–54)
ANION GAP: 6 (ref 5–15)
AST: 23 U/L (ref 15–41)
Albumin: 2.5 g/dL — ABNORMAL LOW (ref 3.5–5.0)
Alkaline Phosphatase: 53 U/L (ref 38–126)
BILIRUBIN TOTAL: 0.6 mg/dL (ref 0.3–1.2)
BUN: 7 mg/dL (ref 6–20)
CO2: 30 mmol/L (ref 22–32)
Calcium: 8.2 mg/dL — ABNORMAL LOW (ref 8.9–10.3)
Chloride: 99 mmol/L — ABNORMAL LOW (ref 101–111)
Creatinine, Ser: 0.62 mg/dL (ref 0.44–1.00)
GFR calc Af Amer: >60 mL/min (ref >60)
GFR calc non Af Amer: >60 mL/min (ref >60)
Glucose, Bld: 108 mg/dL — ABNORMAL HIGH (ref 65–99)
POTASSIUM: 4 mmol/L (ref 3.5–5.1)
Sodium: 135 mmol/L (ref 135–145)
TOTAL PROTEIN: 5.1 g/dL — AB (ref 6.5–8.1)

## 2018-04-25 LAB — CBC
HCT: 31.4 % — ABNORMAL LOW (ref 36.0–46.0)
Hemoglobin: 10.3 g/dL — ABNORMAL LOW (ref 12.0–15.0)
MCH: 34.1 pg — AB (ref 26.0–34.0)
MCHC: 32.8 g/dL (ref 30.0–36.0)
MCV: 104 fL — AB (ref 78.0–100.0)
PLATELETS: 309 10*3/uL (ref 150–400)
RBC: 3.02 MIL/uL — ABNORMAL LOW (ref 3.87–5.11)
RDW: 12.4 % (ref 11.5–15.5)
WBC: 13.4 10*3/uL — ABNORMAL HIGH (ref 4.0–10.5)

## 2018-04-25 LAB — GLUCOSE, CAPILLARY
GLUCOSE-CAPILLARY: 124 mg/dL — AB (ref 65–99)
GLUCOSE-CAPILLARY: 106 mg/dL — AB (ref 65–99)
GLUCOSE-CAPILLARY: 109 mg/dL — AB (ref 65–99)
Glucose-Capillary: 103 mg/dL — ABNORMAL HIGH (ref 65–99)
Glucose-Capillary: 105 mg/dL — ABNORMAL HIGH (ref 65–99)
Glucose-Capillary: 123 mg/dL — ABNORMAL HIGH (ref 65–99)

## 2018-04-25 LAB — DIFFERENTIAL
Abs Immature Granulocytes: 0.1 10*3/uL (ref 0.0–0.1)
Basophils Absolute: 0 10*3/uL (ref 0.0–0.1)
Basophils Relative: 0 %
EOS ABS: 0.3 10*3/uL (ref 0.0–0.7)
EOS PCT: 2 %
Immature Granulocytes: 1 %
LYMPHS ABS: 0.8 10*3/uL (ref 0.7–4.0)
Lymphocytes Relative: 6 %
MONOS PCT: 5 %
Monocytes Absolute: 0.6 10*3/uL (ref 0.1–1.0)
Neutro Abs: 11.5 10*3/uL — ABNORMAL HIGH (ref 1.7–7.7)
Neutrophils Relative %: 86 %

## 2018-04-25 LAB — PHOSPHORUS: Phosphorus: 2.8 mg/dL (ref 2.5–4.6)

## 2018-04-25 LAB — PREALBUMIN: PREALBUMIN: 5.2 mg/dL — AB (ref 18–38)

## 2018-04-25 LAB — TRIGLYCERIDES: TRIGLYCERIDES: 106 mg/dL (ref <150)

## 2018-04-25 LAB — MAGNESIUM: Magnesium: 1.8 mg/dL (ref 1.7–2.4)

## 2018-04-25 MED ORDER — MAGNESIUM SULFATE 2 GM/50ML IV SOLN
2.0000 g | Freq: Once | INTRAVENOUS | Status: AC
  Administered 2018-04-25: 2 g via INTRAVENOUS
  Filled 2018-04-25: qty 50

## 2018-04-25 MED ORDER — SODIUM GLYCEROPHOSPHATE 1 MMOLE/ML IV SOLN
10.0000 mmol | Freq: Once | INTRAVENOUS | Status: AC
  Administered 2018-04-25: 10 mmol via INTRAVENOUS
  Filled 2018-04-25: qty 10

## 2018-04-25 MED ORDER — KCL-LACTATED RINGERS-D5W 20 MEQ/L IV SOLN
INTRAVENOUS | Status: DC
  Administered 2018-04-25 – 2018-04-27 (×3): via INTRAVENOUS
  Filled 2018-04-25 (×4): qty 1000

## 2018-04-25 MED ORDER — TRAVASOL 10 % IV SOLN
INTRAVENOUS | Status: AC
  Administered 2018-04-25: 18:00:00 via INTRAVENOUS
  Filled 2018-04-25: qty 504

## 2018-04-25 MED ORDER — BOOST / RESOURCE BREEZE PO LIQD CUSTOM
1.0000 | Freq: Two times a day (BID) | ORAL | Status: DC
  Administered 2018-04-25 – 2018-04-28 (×5): 1 via ORAL

## 2018-04-25 NOTE — Progress Notes (Signed)
[place] NOTE   Pharmacy Consult for TPN Indication: prolonged ileus  Patient Measurements: Height: 5\' 5"  (165.1 cm) Weight: 144 lb 13.5 oz (65.7 kg) IBW/kg (Calculated) : 57 TPN AdjBW (KG): 54.9 Body mass index is 24.1 kg/m. Usual Weight: 55 kg  Assessment:  62 y/o female who had right TKR on 04/12/18 who presented to the ED on 04/17/18 with constipation, abdominal pain, and vomiting found to have a pSBO likely from chronic constipation in setting of opiate use vs adhesions. NGT placed and bowel rest initiated but she failed medical management and is s/p ex lap, LOA, and SBR on 04/22/18. She has had minimal nutrition over the last 9-10 days. Pharmacy consulted to begin TPN for ileus.  GI: post-op ileus, no nausea, passing gas. NGT output 783ml, plans to pull NG tube today. LBM 6/19 Endo: no hx DM, SSI (sens) CBGs controlled Insulin requirements in the past 24 hours: none Lytes: Mg 1.8 s/p 1g IV (goal >=2 for ileus), K 4, Cl low 99 Renal: SCr wnl, UOP 1.3 ml/kg/hr. +873ml. D5LR + 20K at 85 ml/hr Pulm: RA Cards: VSS Hepatobil: LFT wnl, TG 156>106 Neuro: High pain scores, on PRN dilaudid ID: WBC elevated - no abx  TPN Access: double lumen PICC 6/23 TPN start date: 6/23 >> Nutritional Goals (pending RD recommendation): KCal: 1625-1950 Protein: 65-75 g Fluid: 2L  Goal TPN rate is 83 ml/hr (provides 70 g protein, 294 g dextrose, and 49 g lipids)  Current Nutrition:  TPN Clean  Plan:  Increase TPN to 60 mL/hr This TPN provides 50 g of protein, 216 g of dextrose, and 35.4 g of lipids which provides  1290 kCals per day, meeting ~80% of patient needs Electrolytes in TPN: Increase Mg, phos, Na, K with rate increase (Cl: acetate 2:1) Add MVI, trace elements Continue sensitive SSI and adjust as needed Mg 2g IV x 1, phos 72mmol IV x 1 Decrease IV fluids (D5LR + 20K) to 65 ml/hr  F/u RD recs, tolerance to [place],  PharmD Pharmacy Resident [phone] 04/25/2018 9:47 AM

## 2018-04-25 NOTE — Anesthesia Postprocedure Evaluation (Signed)
Anesthesia Post Note  Patient: Bethany [name]  Procedure(s) Performed: EXPLORATORY LAPAROTOMY (N/A Abdomen)     Patient location during evaluation: PACU Anesthesia Type: General Level of consciousness: awake and alert Pain management: pain level controlled Vital Signs Assessment: post-procedure vital signs reviewed and stable Respiratory status: spontaneous breathing, nonlabored ventilation, respiratory function stable and patient connected to nasal cannula oxygen Cardiovascular status: blood pressure returned to baseline and stable Postop Assessment: no apparent nausea or vomiting Anesthetic complications: no    Last Vitals:  Vitals:   04/24/18 2031 04/25/18 0340  BP: 110/71 115/73  Pulse: 77 80  Resp: 16 16  Temp: 37 C 37.1 C  SpO2: 91% 95%    Last Pain:  Vitals:   04/25/18 0610  TempSrc:   PainSc: Asleep                 Srihith Aquilino S

## 2018-04-25 NOTE — Progress Notes (Signed)
[facility]/Trauma Progress Note  3 Days Post-Op   Assessment/Plan Principal Problem:   SBO (small bowel obstruction) (HCC) Active Problems:   Raynaud's disease   Recurrent herpes labialis   Mood disorder (HCC)   Tubular adenoma of colon   Primary osteoarthritis of right knee   IBS (irritable bowel syndrome)  S/P right knee replacement - 04/12/2018 - [facility]orf  SBO - S/P exploratory laparotomy, enterolysis, sb resection - 04/22/2018, Dr. Brantley Stage - NGT tip at level of clavicles, no nausea, having flatus, will pull tube   FEN: IVF, TPN, clears, pull NGT VTE: SCD's, lovenox ID: none, WBC down to 13.4 Foley: none Follow up: Dr. Brantley Stage  DISPO: pull NGT, clears, encourage ambulation and IS. Continue TPN at least through tomorrow.     LOS: 8 days    Subjective: CC: SBO, abdominal pain  Pt states some flatus. No nausea or vomiting. Talked with Husband on the phone. Discussed results of chest xray and the importance of IS.   Objective: Vital signs in last 24 hours: Temp:  [98.3 F (36.8 C)-98.7 F (37.1 C)] 98.7 F (37.1 C) (06/24 0340) Pulse Rate:  [77-89] 80 (06/24 0340) Resp:  [16] 16 (06/24 0340) BP: (110-138)/(71-76) 115/73 (06/24 0340) SpO2:  [91 %-95 %] 95 % (06/24 0340) Last BM Date: 04/20/18  Intake/Output from previous day: 06/23 0701 - 06/24 0700 In: 790 [I.V.:620; NG/GT:170] Out: 2050 [Urine:2050] Intake/Output this shift: No intake/output data recorded.  PE: Gen:  Alert, NAD, pleasant, cooperative Pulm:  Rate and effort normal Abd: Soft, ND, +BS, TTP around incision, incision with honeycomb in place Skin: no rashes noted, warm and dry   Anti-infectives: Anti-infectives (From admission, onward)   None      Lab Results:  Recent Labs    04/24/18 0525 04/25/18 0408  WBC 21.9* 13.4*  HGB 12.6 10.3*  HCT 40.0 31.4*  PLT 428* 309   BMET Recent Labs    04/24/18 0525 04/25/18 0408  NA 138  140 135  K 3.9  3.8 4.0  CL 99*   101 99*  CO2 31  31 30   GLUCOSE 126*  134* 108*  BUN 8  9 7   CREATININE 0.80  0.80 0.62  CALCIUM 8.8*  8.9 8.2*   PT/INR No results for input(s): LABPROT, INR in the last 72 hours. CMP     Component Value Date/Time   NA 135 04/25/2018 0408   K 4.0 04/25/2018 0408   CL 99 (L) 04/25/2018 0408   CO2 30 04/25/2018 0408   GLUCOSE 108 (H) 04/25/2018 0408   BUN 7 04/25/2018 0408   CREATININE 0.62 04/25/2018 0408   CREATININE 1.05 (H) 03/26/2017 0929   CALCIUM 8.2 (L) 04/25/2018 0408   PROT 5.1 (L) 04/25/2018 0408   ALBUMIN 2.5 (L) 04/25/2018 0408   AST 23 04/25/2018 0408   ALT 17 04/25/2018 0408   ALKPHOS 53 04/25/2018 0408   BILITOT 0.6 04/25/2018 0408   GFRNONAA >60 04/25/2018 0408   GFRAA >60 04/25/2018 0408   Lipase     Component Value Date/Time   LIPASE 26 04/17/2018 0353    Studies/Results: Dg Chest Port 1 View  Result Date: 04/24/2018 CLINICAL DATA:  Chest pain and shortness of breath.  Leukocytosis. EXAM: PORTABLE CHEST 1 VIEW COMPARISON:  03/31/2018. FINDINGS: Interval nasogastric tube with its tip at the level of the clavicles and side hole in the lower neck. Interval right PICC with its tip at the superior cavoatrial junction. Normal sized heart. Decreased inspiration  with bibasilar airspace opacity, linear densities and small pleural effusions. Normal appearing bones. IMPRESSION: 1. Poor inspiration with bibasilar atelectasis and small bilateral pleural effusions. Pneumonia cannot be excluded at either lung base. 2. Nasogastric tube tip at the level of the clavicles and side hole in the lower neck. Electronically Signed   By: Claudie Revering M.D.   On: 04/24/2018 19:27   [place] Ekg Site Rite  Result Date: 04/24/2018 If Site Rite image not attached, placement could not be confirmed due to current cardiac rhythm.     Kalman Drape , [facility] 04/25/2018, 9:17 AM  Pager: [phone] Mon-Wed, Friday 7:00am-4:30pm Thurs  7am-11:30am  Consults: [phone]

## 2018-04-25 NOTE — Progress Notes (Addendum)
Initial Nutrition Assessment  DOCUMENTATION CODES:   Not applicable  INTERVENTION:   -TPN management per pharmacy -Boost Breeze po BID, each supplement provides 250 kcal and 9 grams of protein -RD will follow for diet advancement and supplement as appropriate  NUTRITION DIAGNOSIS:   Inadequate oral intake related to altered GI function as evidenced by (just advanced to clear liquids, TPN dependent).  GOAL:   Patient will meet greater than or equal to 90% of their needs  MONITOR:   PO intake, Supplement acceptance, Diet advancement, Labs, Weight trends, Skin, I & O's  REASON FOR ASSESSMENT:   Malnutrition Screening Tool, Consult New TPN/TNA  ASSESSMENT:   62 year old female past medical history significant for osteoporosis with the right knee status post right total knee replacement on 04/12/2018 just discharged from our facility presents with worsening generalized abdominal pain.  She is found to have a partial small bowel obstruction likely related to inactivity and pain medications.  Pt admitted with partial SBO.   6/16- NGT placed 6/19- NGT pulled, replaced, clamping trials 6/20- advanced to clear liquid diet 6/21- s/p ex lap, enterolysis, sb resection 6/23- PICC placed, TPN initiated  Reviewed I/O's: 170 ml output from NGT. -1.3 L x 24 houts and +799 ml since admission  Case discussed with RN, who reports NGT was d/c earlier today and advanced to clear liquids. RN has provided water, however, unsure if she has taken anything yet.   Spoke with pt at bedside, who endorses feeling well today, however, reports "that past 7 days have just been a big blur to me". She is very excited that NGT has been removed. She has not tried any clear liquids yet, but thinks she will be able to tolerate them.   PTA, pt reports good appetite. She consumes 5-6 small meals per day, such as yogurt and granola, apple and peanut butter, and salads. She eats very healthfully and reports she  works out daily; she has always been small framed. She is concerned about weight gain (noted edema in rt lower extremity). She shares UBW is around 125#.   Pt receiving TPN via PICC; currently infusing at 40 ml/hr, which is providing 859 kcals and 33 grams protein (meeting 49% of estimated kcal needs and 39% of estimated protein needs).   Discussed with pt rationale for diet advancement and TPN, as well as what foods to expect on clear liquids. Pt has not further questions at this time, but expressed appreciation for visit.   Labs reviewed: CBGS: 92-124 (inpatient orders for glycemic control are 0-9 units insulin aspart every 4 hours).   NUTRITION - FOCUSED PHYSICAL EXAM:    Most Recent Value  Orbital Region  No depletion  Upper Arm Region  No depletion  Thoracic and Lumbar Region  No depletion  Buccal Region  No depletion  Temple Region  No depletion  Clavicle Bone Region  Mild depletion  Clavicle and Acromion Bone Region  No depletion  Scapular Bone Region  No depletion  Dorsal Hand  Mild depletion  Patellar Region  No depletion  Anterior Thigh Region  No depletion  Posterior Calf Region  No depletion  Edema (RD Assessment)  Moderate  Hair  Reviewed  Eyes  Reviewed  Mouth  Reviewed  Skin  Reviewed  Nails  Reviewed       Diet Order:   Diet Order           Diet clear liquid Room service appropriate? Yes; Fluid consistency: Thin  Diet effective now  EDUCATION NEEDS:   Education needs have been addressed  Skin:  Skin Assessment: Skin Integrity Issues: Skin Integrity Issues:: Incisions Incisions: closed rt knee, abdomen  Last BM:  04/20/18  Height:   Ht Readings from Last 1 Encounters:  04/22/18 5\' 5"  (1.651 m)    Weight:   Wt Readings from Last 1 Encounters:  04/24/18 144 lb 13.5 oz (65.7 kg)    Ideal Body Weight:  56.8 kg  BMI:  Body mass index is 24.1 kg/m.  Estimated Nutritional Needs:   Kcal:  6283-1517  Protein:  85-100 grams  Fluid:   1.7-1.9 L     Tauno Falotico A. Jimmye Norman, RD, LDN, CDE Pager: [phone] After hours Pager: [phone]

## 2018-04-25 NOTE — Progress Notes (Signed)
PROGRESS NOTE                                                                                                                                                                                                             Patient Demographics:    Bethany [name], is a 62 y.o. female, DOB - 09/22/1956, TKP:[id number]  Admit date - 04/17/2018   Admitting Physician Lady Deutscher, MD  Outpatient Primary MD for the patient is Denita Lung, MD  LOS - 8   Chief Complaint  Patient presents with  . Constipation  . Abdominal Pain       Brief Narrative   Bethany [name] is a 62 year old female with past medical history significant for osteoarthritis of the right knee status post right total knee replacement on 04/12/2018 who presented with worsening generalized abdominal pain.  CT abdomen pelvis with contrast done on 04/17/2018 revealed moderate to high-grade partial small bowel obstruction.  Prior hysterectomy noted on imaging.  Recently on opiates however stated that she was taking laxative and stool softener along with her pain medications.  History of tubular adenoma of colon one year ago.  Admitted for partial small bowel obstruction.    No improvement with NG tube n.p.o. with IV fluids, pain been going for exploratory protamine where she did have distal ileum obstruction status post resection, postoperative course complicated by ileus   Subjective:    Bethany [name] today denies any fever or chills, she passed small amount of flatus, and smear of stool  Assessment  & Plan :    Principal Problem:   SBO (small bowel obstruction) ([place]) Active Problems:   Raynaud's disease   Recurrent herpes labialis   Mood disorder ([place])   Tubular adenoma of colon   Primary osteoarthritis of right knee   IBS (irritable bowel syndrome)    Partial small bowel obstruction. - With initial improvement after NGT insertion, but with recurrent evidence of small bowel obstruction  on imaging, unfortunately after discontinuing her NG tube she had recurrence of symptoms, so general surgery input greatly appreciated, status post exploratory laparotomy 04/22/2018 with lysis of adhesions and small bowel resection for chronic stenosis distal ileum secondary to long-standing adhesions . -Appears to be having postoperative ileus, Caryl Comes has been inserted and started on TPN -NGT on imaging appears to be in the throat area,  will try to advance, significant discomfort in the nose, most likely being looked in her throat, it was discontinued. -She was significant leukocytosis yesterday, negative urinalysis, checks x-ray with bibasilar atelectasis versus opacities, she denies fever, denies cough or dyspnea, this is most likely due to atelectasis from shallow breath, and have encouraged her today to use incentive spirometry, leukocytosis trending down without any antibiotics.  Status post right total knee replacement  - appears stable , continue with as needed pain medication, continue with PT and ambulation Weightbearing as tolerated as recommended by orthopedic surgery Pain management with IV Toradol due to partial small bowel obstruction  Hyperlipidemia - Continue to hold statin due to n.p.o.  hypokalemia - repleted,   Hypernatremia -Resolved,   Chronic macrocytic anemia - Hemoglobin 10.7; baseline hemoglobin 12 - MCV 101.9, b12 WnL follow on folic acid  Ambulatory dysfunction due to recent R knee surgery - Continue PT as recommended by orthopedic surgery     Code Status : Full  Family Communication  : None at bedside  Disposition Plan  : Home when stable  Consults  :  General surgery  Procedures  : none  DVT Prophylaxis  :  [place] lovenox  Lab Results  Component Value Date   PLT 309 04/25/2018    Antibiotics  :    Anti-infectives (From admission, onward)   None        Objective:   Vitals:   04/24/18 1410 04/24/18 2031 04/25/18 0340 04/25/18 1249    BP: 138/76 110/71 115/73 (!) 155/80  Pulse: 89 77 80 82  Resp: 16 16 16 18   Temp: 98.3 F (36.8 C) 98.6 F (37 C) 98.7 F (37.1 C) 98 F (36.7 C)  TempSrc: Oral Oral Oral Oral  SpO2: 92% 91% 95% 100%  Weight:      Height:        Wt Readings from Last 3 Encounters:  04/24/18 65.7 kg (144 lb 13.5 oz)  04/12/18 55.1 kg (121 lb 6.4 oz)  03/31/18 55.1 kg (121 lb 6.4 oz)     Intake/Output Summary (Last 24 hours) at 04/25/2018 1449 Last data filed at 04/25/2018 1100 Gross per 24 hour  Intake 790 ml  Output 1850 ml  Net -1060 ml     Physical Exam  Awake Alert, Oriented X 3, No new F.N deficits, Normal affect Symmetrical Chest wall movement, minister entry at the bases, no rales or rhonchi  RRR,No Gallops,Rubs or new Murmurs, No Parasternal Heave +ve B.Sounds slightly diminished, midline surgical wound with honeycomb mesh cover, minimally tender, mildly distended Surgical scar with honey comb mesh cover, Abd Soft, No tenderness, No organomegaly appriciated, No rebound - guarding or rigidity. No Cyanosis, Clubbing or edema, No new Rash or bruise        Data Review:    CBC Recent Labs  Lab 04/21/18 0654 04/22/18 0702 04/23/18 0440 04/24/18 0525 04/25/18 0408  WBC 8.5 10.4 16.3* 21.9* 13.4*  HGB 10.8* 11.7* 12.3 12.6 10.3*  HCT 34.0* 36.4 36.9 40.0 31.4*  PLT 273 312 337 428* 309  MCV 104.3* 105.2* 100.8* 105.3* 104.0*  MCH 33.1 33.8 33.6 33.2 34.1*  MCHC 31.8 32.1 33.3 31.5 32.8  RDW 12.5 12.6 12.3 12.7 12.4  LYMPHSABS  --   --   --  1.6 0.8  MONOABS  --   --   --  1.0 0.6  EOSABS  --   --   --  0.3 0.3  BASOSABS  --   --   --  0.1 0.0    Chemistries  Recent Labs  Lab 04/20/18 0605 04/21/18 0654 04/22/18 0702 04/23/18 0440 04/24/18 0525 04/25/18 0408  NA 148* 145 144 140 138  140 135  K 3.1* 3.8 3.9 4.1 3.9  3.8 4.0  CL 110 111 109 104 99*  101 99*  CO2 33* 30 29 31 31  31 30   GLUCOSE 137* 109* 102* 136* 126*  134* 108*  BUN 20 14 13 9 8  9  7   CREATININE 0.77 0.67 0.84 0.72 0.80  0.80 0.62  CALCIUM 8.8* 8.6* 8.8* 8.3* 8.8*  8.9 8.2*  MG 1.8 1.8 1.9  --  1.8 1.8  AST  --   --   --   --  29 23  ALT  --   --   --   --  21 17  ALKPHOS  --   --   --   --  67 53  BILITOT  --   --   --   --  0.8 0.6   ------------------------------------------------------------------------------------------------------------------ Recent Labs    04/24/18 0525 04/25/18 0408  TRIG 156* 106    No results found for: HGBA1C ------------------------------------------------------------------------------------------------------------------ No results for input(s): TSH, T4TOTAL, T3FREE, THYROIDAB in the last 72 hours.  Invalid input(s): FREET3 ------------------------------------------------------------------------------------------------------------------ No results for input(s): VITAMINB12, FOLATE, FERRITIN, TIBC, IRON, RETICCTPCT in the last 72 hours.  Coagulation profile No results for input(s): INR, PROTIME in the last 168 hours.  No results for input(s): DDIMER in the last 72 hours.  Cardiac Enzymes No results for input(s): CKMB, TROPONINI, MYOGLOBIN in the last 168 hours.  Invalid input(s): CK ------------------------------------------------------------------------------------------------------------------ No results found for: BNP  Inpatient Medications  Scheduled Meds: . enoxaparin (LOVENOX) injection  40 mg Subcutaneous Q24H  . feeding supplement  1 Container Oral BID BM  . insulin aspart  0-9 Units Subcutaneous Q4H  . mouth rinse  15 mL Mouth Rinse BID  . sodium chloride flush  10-40 mL Intracatheter Q12H   Continuous Infusions: . dextrose 5% lactated ringers with KCl 20 mEq/L 85 mL/hr at 04/25/18 0416  . dextrose 5% lactated ringers with KCl 20 mEq/L    . magnesium sulfate 1 - 4 g bolus IVPB    . sodium glycerophosphate 0.9% NaCl IVPB    . TPN ADULT (ION) 40 mL/hr at 04/24/18 1711  . TPN ADULT (ION)     PRN  Meds:.acetaminophen **OR** acetaminophen, bisacodyl, fentaNYL (SUBLIMAZE) injection, HYDROmorphone (DILAUDID) injection, LORazepam, ondansetron **OR** ondansetron (ZOFRAN) IV, phenol, sodium chloride flush  Micro Results No results found for this or any previous visit (from the past 240 hour(s)).  Radiology Reports Dg Chest 2 View  Result Date: 04/01/2018 CLINICAL DATA:  Preoperative for right total knee arthroplasty. EXAM: CHEST - 2 VIEW COMPARISON:  None. FINDINGS: Normal heart size. Normal mediastinal contour. No pneumothorax. No pleural effusion. Lungs appear clear, with no acute consolidative airspace disease and no pulmonary edema. IMPRESSION: No active cardiopulmonary disease. Electronically Signed   By: Ilona Sorrel M.D.   On: 04/01/2018 08:15   Dg Abd 1 View  Result Date: 04/21/2018 CLINICAL DATA:  Small bowel obstruction. EXAM: ABDOMEN - 1 VIEW COMPARISON:  Radiographs of April 20, 2018. FINDINGS: Stable small bowel dilatation is noted concerning for distal small bowel obstruction. No colonic dilatation is noted. Phleboliths are noted in the pelvis. IMPRESSION: Stable small bowel dilatation concerning for distal small bowel obstruction. Electronically Signed   By: Marijo Conception, M.D.   On: 04/21/2018 08:38  Dg Abd 1 View  Result Date: 04/20/2018 CLINICAL DATA:  Evaluate for small bowel obstruction and NG tube placement EXAM: ABDOMEN - 1 VIEW COMPARISON:  04/19/2018 FINDINGS: NG tube tip is in the distal esophagus with the side port in the mid esophagus. This should be advanced several cm. Small bowel dilatation noted in the abdomen compatible with small bowel obstruction as seen on prior study. Bibasilar atelectasis. IMPRESSION: NG tube tip in the distal esophagus. Recommend advancing several cm. Small bowel obstruction pattern, stable. Electronically Signed   By: Rolm Baptise M.D.   On: 04/20/2018 08:16   Dg Abd 1 View  Result Date: 04/19/2018 CLINICAL DATA:  Small bowel obstruction.  EXAM: ABDOMEN - 1 VIEW COMPARISON:  CT abdomen and pelvis April 17, 2018; abdominal radiograph April 18, 2018 FINDINGS: Nasogastric tube tip is in the gastric cardia region. The side-port is not seen but is felt to be above the diaphragm. There remain loops of dilated small bowel, similar to 1 day prior. No free air. There are phleboliths in the pelvis. IMPRESSION: Findings indicative of a degree of persistent bowel obstruction. No free air. Nasogastric tube tip in proximal stomach with side port above the diaphragm. Advise advancing nasogastric tube approximately 8 cm to insure that side-port as well as nasogastric tube tip are within the stomach. Electronically Signed   By: Lowella Grip III M.D.   On: 04/19/2018 07:55   Ct Abdomen Pelvis W Contrast  Result Date: 04/17/2018 CLINICAL DATA:  Constipation, abdominal pain, vomiting EXAM: CT ABDOMEN AND PELVIS WITH CONTRAST TECHNIQUE: Multidetector CT imaging of the abdomen and pelvis was performed using the standard protocol following bolus administration of intravenous contrast. CONTRAST:  145mL OMNIPAQUE IOHEXOL 300 MG/ML  SOLN COMPARISON:  None. FINDINGS: Lower chest: Bilateral breast implants partially imaged. Linear atelectasis or scarring at the right base. No effusions. Hepatobiliary: No focal hepatic abnormality. Gallbladder unremarkable. Pancreas: No focal abnormality or ductal dilatation. Spleen: No focal abnormality.  Normal size. Adrenals/Urinary Tract: No adrenal abnormality. No focal renal abnormality. No stones or hydronephrosis. Urinary bladder is unremarkable. Stomach/Bowel: Dilated fluid-filled small bowel loops with air-fluid levels noted throughout the abdomen and pelvis. Distal small bowel loops appear decompressed. Exact transition is not visualize, but findings compatible with moderate to high-grade partial small bowel obstruction. Large bowel decompressed, grossly unremarkable. Vascular/Lymphatic: No evidence of aneurysm or adenopathy.  Reproductive: Prior hysterectomy.  No adnexal masses. Other: Small amount of free fluid in the pelvis.  No free air. Musculoskeletal: No acute bony abnormality. L5 related grade 1 anterolisthesis of to facet disease L4 on. Degenerative disc disease at L4-5. IMPRESSION: Findings compatible with moderate to high-grade partial small bowel obstruction. Exact transition and cause not visualized, presumably adhesions. Prior hysterectomy. Electronically Signed   By: Rolm Baptise M.D.   On: 04/17/2018 09:11   Dg Chest Port 1 View  Result Date: 04/24/2018 CLINICAL DATA:  Chest pain and shortness of breath.  Leukocytosis. EXAM: PORTABLE CHEST 1 VIEW COMPARISON:  03/31/2018. FINDINGS: Interval nasogastric tube with its tip at the level of the clavicles and side hole in the lower neck. Interval right PICC with its tip at the superior cavoatrial junction. Normal sized heart. Decreased inspiration with bibasilar airspace opacity, linear densities and small pleural effusions. Normal appearing bones. IMPRESSION: 1. Poor inspiration with bibasilar atelectasis and small bilateral pleural effusions. Pneumonia cannot be excluded at either lung base. 2. Nasogastric tube tip at the level of the clavicles and side hole in the lower neck. Electronically Signed  By: Claudie Revering M.D.   On: 04/24/2018 19:27   Dg Abd Portable 1v  Result Date: 04/22/2018 CLINICAL DATA:  Follow up small bowel obstruction EXAM: PORTABLE ABDOMEN - 1 VIEW COMPARISON:  04/21/2018 FINDINGS: Multiple dilated loops of small bowel are again identified roughly similar to that seen on the prior exam. The bladder is distended. No free air is seen. No acute bony abnormality is noted. IMPRESSION: Stable small bowel dilatation. Electronically Signed   By: Inez Catalina M.D.   On: 04/22/2018 07:02   Dg Abd Portable 1v-small Bowel Obstruction Protocol-24 Hr Delay  Result Date: 04/18/2018 CLINICAL DATA:  Small-bowel obstruction, 24 hour delay EXAM: PORTABLE ABDOMEN  - 1 VIEW COMPARISON:  Portable exam 1635 hours compared to 04/18/2018 FINDINGS: Tip of orogastric tube projects over proximal stomach though the proximal side-port projects over the distal esophagus; recommend advancing tube 7 cm. Small amount gas within stomach. Persistent significant gaseous distention of small bowel loops consistent with small bowel obstruction, small bowel measuring up to 4.2 cm transverse slightly less than the 4.6 cm on the prior exam. No colonic gas. Questionable mild wall thickening of a single small bowel loop in the LEFT upper quadrant. New osseous structures unremarkable. Lung bases clear. IMPRESSION: Persistent small bowel obstruction. Electronically Signed   By: Lavonia Dana M.D.   On: 04/18/2018 16:57   Dg Abd Portable 1v-small Bowel Obstruction Protocol-initial, 8 Hr Delay  Result Date: 04/18/2018 CLINICAL DATA:  Follow up small bowel obstruction. EXAM: PORTABLE ABDOMEN - 1 VIEW COMPARISON:  Abdominal radiograph April 17, 2018 FINDINGS: Persistent gas distended small bowel measuring to 4.2 cm. Paucity of large bowel gas. Nasogastric tube tip projects at proximal stomach. Phleboliths project in the pelvis. Soft tissue and osseous structures non suspicious. IMPRESSION: Unchanged small bowel obstruction. Nasogastric tube tip projects in proximal stomach. Electronically Signed   By: Elon Alas M.D.   On: 04/18/2018 01:42   Dg Abd Portable 1v-small Bowel Protocol-position Verification  Result Date: 04/17/2018 CLINICAL DATA:  Confirm NG tube placement. EXAM: PORTABLE ABDOMEN - 1 VIEW COMPARISON:  04/17/2018. FINDINGS: Enteric tube is identified with tip 2.8 cm below the level of the GE junction. The side port is 5 cm above the GE junction. Persistent small bowel dilatation compatible with obstruction. IMPRESSION: The tip of the NG tube projects over the proximal stomach just below the level of the GE junction and the side port is above the GE junction. Advise further tube  advancement such that the side port is at least below the GE junction. Electronically Signed   By: Kerby Moors M.D.   On: 04/17/2018 10:50   [place] Ekg Site Rite  Result Date: 04/24/2018 If Site Rite image not attached, placement could not be confirmed due to current cardiac rhythm.    Phillips Climes M.D on 04/25/2018 at 2:49 PM  Between 7am to 7pm - Pager - [phone]  After 7pm go to [email or web address] - password [facility]  [facility] -  Office  [phone]

## 2018-04-25 NOTE — Progress Notes (Signed)
Occupational Therapy Treatment Patient Details Name: Bethany [name] MRN: [id number] DOB: 06/03/1956 Today's Date: 04/25/2018    History of present illness 62 year old female past medical history significant for osteoporosis with the right knee status post right total knee replacement on 04/12/2018 just discharged from our facility presents with worsening generalized abdominal pain.  She is found to have a partial small bowel obstruction likely related to inactivity and pain medications.   OT comments  Pt with tangential thoughts and comments, difficult to converse with her at times. Performed standing grooming. Continues to have difficulty with LB dressing due to knee and abdominal pain. Will continue to follow.  Follow Up Recommendations  No OT follow up    Equipment Recommendations  None recommended by OT    Recommendations for Other Services      Precautions / Restrictions Precautions Precautions: Fall;Knee Restrictions RLE Weight Bearing: Weight bearing as tolerated       Mobility Bed Mobility Overal bed mobility: Needs Assistance Bed Mobility: Supine to Sit;Sit to Supine     Supine to sit: Supervision;HOB elevated Sit to supine: Supervision   General bed mobility comments: increased time required, assist for lines  Transfers Overall transfer level: Needs assistance Equipment used: Rolling walker (2 wheeled) Transfers: Sit to/from Stand Sit to Stand: Min guard         General transfer comment: from bed and toilet    Balance     Sitting balance-Leahy Scale: Good       Standing balance-Leahy Scale: Fair Standing balance comment: can release walker in static standing                           ADL either performed or assessed with clinical judgement   ADL Overall ADL's : Needs assistance/impaired     Grooming: Oral care;Standing;Min guard                   Toilet Transfer: Min guard;Ambulation;RW;Comfort height toilet   Toileting-  Clothing Manipulation and Hygiene: Min guard;Sit to/from stand       Functional mobility during ADLs: Passenger transport manager     Praxis      Cognition Arousal/Alertness: Awake/alert Behavior During Therapy: WFL for tasks assessed/performed Overall Cognitive Status: Impaired/Different from baseline Area of Impairment: Memory                     Memory: Decreased short-term memory         General Comments: pt with tangential conversation, difficult to follow, does not answer questions directly        Exercises     Shoulder Instructions       General Comments      Pertinent Vitals/ Pain       Pain Assessment: Faces Faces Pain Scale: Hurts a little bit Pain Location: R knee and abdomen Pain Descriptors / Indicators: Aching;Sore;Discomfort;Grimacing;Guarding;Operative site guarding Pain Intervention(s): Monitored during session;Repositioned  Home Living                                          Prior Functioning/Environment              Frequency  Min 2X/week        Progress Toward Goals  OT Goals(current goals can  now be found in the care plan section)  Progress towards OT goals: Progressing toward goals  Acute Rehab OT Goals Patient Stated Goal: return home OT Goal Formulation: With patient Time For Goal Achievement: 05/02/18 Potential to Achieve Goals: Good  Plan Discharge plan remains appropriate    Co-evaluation                 AM-PAC PT "6 Clicks" Daily Activity     Outcome Measure   Help from another person eating meals?: None Help from another person taking care of personal grooming?: A Little Help from another person toileting, which includes using toliet, bedpan, or urinal?: A Little Help from another person bathing (including washing, rinsing, drying)?: A Little Help from another person to put on and taking off regular upper body clothing?: A Little Help from  another person to put on and taking off regular lower body clothing?: A Little 6 Click Score: 19    End of Session Equipment Utilized During Treatment: Gait belt;Rolling walker  OT Visit Diagnosis: Other abnormalities of gait and mobility (R26.89);Pain   Activity Tolerance Patient tolerated treatment well   Patient Left in bed;with call bell/phone within reach;with bed alarm set   Nurse Communication          Time: 2505-3976 OT Time Calculation (min): 12 min  Charges: OT General Charges $OT Visit: 1 Visit OT Treatments $Self Care/Home Management : 8-22 mins  04/25/2018 Nestor Lewandowsky, OTR/L Pager: [phone]   Werner Lean, Haze Boyden 04/25/2018, 2:18 PM

## 2018-04-26 LAB — CBC
HCT: 31.9 % — ABNORMAL LOW (ref 36.0–46.0)
Hemoglobin: 10.5 g/dL — ABNORMAL LOW (ref 12.0–15.0)
MCH: 33 pg (ref 26.0–34.0)
MCHC: 32.9 g/dL (ref 30.0–36.0)
MCV: 100.3 fL — ABNORMAL HIGH (ref 78.0–100.0)
PLATELETS: 322 10*3/uL (ref 150–400)
RBC: 3.18 MIL/uL — ABNORMAL LOW (ref 3.87–5.11)
RDW: 12.1 % (ref 11.5–15.5)
WBC: 14.1 10*3/uL — AB (ref 4.0–10.5)

## 2018-04-26 LAB — GLUCOSE, CAPILLARY
GLUCOSE-CAPILLARY: 103 mg/dL — AB (ref 70–99)
GLUCOSE-CAPILLARY: 107 mg/dL — AB (ref 70–99)
GLUCOSE-CAPILLARY: 98 mg/dL (ref 70–99)
Glucose-Capillary: 109 mg/dL — ABNORMAL HIGH (ref 70–99)
Glucose-Capillary: 109 mg/dL — ABNORMAL HIGH (ref 70–99)
Glucose-Capillary: 116 mg/dL — ABNORMAL HIGH (ref 70–99)
Glucose-Capillary: 95 mg/dL (ref 70–99)

## 2018-04-26 LAB — BASIC METABOLIC PANEL
ANION GAP: 8 (ref 5–15)
BUN: 7 mg/dL — ABNORMAL LOW (ref 8–23)
CHLORIDE: 98 mmol/L (ref 98–111)
CO2: 28 mmol/L (ref 22–32)
CREATININE: 0.62 mg/dL (ref 0.44–1.00)
Calcium: 8 mg/dL — ABNORMAL LOW (ref 8.9–10.3)
GFR calc non Af Amer: >60 mL/min (ref >60)
Glucose, Bld: 114 mg/dL — ABNORMAL HIGH (ref 70–99)
Potassium: 3.6 mmol/L (ref 3.5–5.1)
SODIUM: 134 mmol/L — AB (ref 135–145)

## 2018-04-26 LAB — PHOSPHORUS: Phosphorus: 3.5 mg/dL (ref 2.5–4.6)

## 2018-04-26 LAB — MAGNESIUM: MAGNESIUM: 1.9 mg/dL (ref 1.7–2.4)

## 2018-04-26 MED ORDER — ACETAMINOPHEN 325 MG PO TABS
650.0000 mg | ORAL_TABLET | Freq: Four times a day (QID) | ORAL | Status: DC
  Administered 2018-04-26 – 2018-04-30 (×17): 650 mg via ORAL
  Filled 2018-04-26 (×18): qty 2

## 2018-04-26 MED ORDER — LORAZEPAM 1 MG PO TABS
1.0000 mg | ORAL_TABLET | ORAL | Status: DC | PRN
  Administered 2018-04-26 – 2018-05-01 (×6): 1 mg via ORAL
  Filled 2018-04-26 (×8): qty 1

## 2018-04-26 MED ORDER — TRAVASOL 10 % IV SOLN
INTRAVENOUS | Status: DC
  Administered 2018-04-26: 18:00:00 via INTRAVENOUS
  Filled 2018-04-26: qty 720

## 2018-04-26 MED ORDER — HYDROMORPHONE HCL 2 MG/ML IJ SOLN: 0.5000 mg | INTRAMUSCULAR | Status: DC | PRN

## 2018-04-26 MED ORDER — HYDROMORPHONE HCL 1 MG/ML IJ SOLN
0.5000 mg | INTRAMUSCULAR | Status: DC | PRN
  Administered 2018-04-26 – 2018-04-27 (×3): 0.5 mg via INTRAVENOUS
  Filled 2018-04-26 (×3): qty 1

## 2018-04-26 MED ORDER — METOCLOPRAMIDE HCL 5 MG/ML IJ SOLN
10.0000 mg | Freq: Four times a day (QID) | INTRAMUSCULAR | Status: DC
  Administered 2018-04-26 – 2018-05-01 (×19): 10 mg via INTRAVENOUS
  Filled 2018-04-26 (×19): qty 2

## 2018-04-26 MED ORDER — OXYCODONE HCL 5 MG PO TABS
5.0000 mg | ORAL_TABLET | ORAL | Status: DC | PRN
Start: 2018-04-26 — End: 2018-04-27
  Administered 2018-04-26 – 2018-04-27 (×2): 10 mg via ORAL
  Filled 2018-04-26 (×3): qty 2

## 2018-04-26 MED ORDER — POTASSIUM CHLORIDE CRYS ER 20 MEQ PO TBCR
40.0000 meq | EXTENDED_RELEASE_TABLET | Freq: Once | ORAL | Status: AC
  Administered 2018-04-26: 40 meq via ORAL
  Filled 2018-04-26: qty 2

## 2018-04-26 NOTE — Progress Notes (Signed)
[facility]/Trauma Progress Note  4 Days Post-Op   Assessment/Plan Principal Problem:   SBO (small bowel obstruction) ([facility]) Active Problems:   Raynaud's disease   Recurrent herpes labialis   Mood disorder ([facility])   Tubular adenoma of colon   Primary osteoarthritis of right knee   IBS (irritable bowel syndrome)  S/P right knee replacement - 04/12/2018 - [facility]  SBO - S/P exploratory laparotomy, enterolysis, sb resection - 04/22/2018, Dr. Brantley Stage - no N/V, having flatus   FEN: IVF, TPN, clears VTE: SCD's, lovenox ID: none, WBC up slightly to 14.1, afebrile Foley: none Follow up: Dr. Brantley Stage  DISPO: continue CLD, encourage ambulation and IS. Continue TPN until return of bowel function. Added breeze. Will remove honeycomb dressing tomorrow POD5   LOS: 9 days    Subjective: CC: abdominal pain  Pt states scant flatus. Pain improving. No nausea or vomiting and tolerating CLD. Daughter at bedside. No BM.   Objective: Vital signs in last 24 hours: Temp:  [98 F (36.7 C)-98.6 F (37 C)] 98.6 F (37 C) (06/25 0333) Pulse Rate:  [82-90] 90 (06/25 0333) Resp:  [16-18] 16 (06/25 0333) BP: (140-155)/(66-80) 140/66 (06/25 0333) SpO2:  [92 %-100 %] 92 % (06/25 0333) Last BM Date: 04/24/18  Intake/Output from previous day: 06/24 0701 - 06/25 0700 In: -  Out: 600 [Urine:600] Intake/Output this shift: Total I/O In: -  Out: 200 [Urine:200]  PE: Gen:  Alert, NAD, pleasant, cooperative Pulm:  Rate and effort normal Abd: Soft, ND, +BS, TTP around incision, incision with honeycomb in place Skin: no rashes noted, warm and dry   Anti-infectives: Anti-infectives (From admission, onward)   None      Lab Results:  Recent Labs    04/25/18 0408 04/26/18 0500  WBC 13.4* 14.1*  HGB 10.3* 10.5*  HCT 31.4* 31.9*  PLT 309 322   BMET Recent Labs    04/25/18 0408 04/26/18 0500  NA 135 134*  K 4.0 3.6  CL 99* 98  CO2 30 28  GLUCOSE 108* 114*  BUN 7  7*  CREATININE 0.62 0.62  CALCIUM 8.2* 8.0*   PT/INR No results for input(s): LABPROT, INR in the last 72 hours. CMP     Component Value Date/Time   NA 134 (L) 04/26/2018 0500   K 3.6 04/26/2018 0500   CL 98 04/26/2018 0500   CO2 28 04/26/2018 0500   GLUCOSE 114 (H) 04/26/2018 0500   BUN 7 (L) 04/26/2018 0500   CREATININE 0.62 04/26/2018 0500   CREATININE 1.05 (H) 03/26/2017 0929   CALCIUM 8.0 (L) 04/26/2018 0500   PROT 5.1 (L) 04/25/2018 0408   ALBUMIN 2.5 (L) 04/25/2018 0408   AST 23 04/25/2018 0408   ALT 17 04/25/2018 0408   ALKPHOS 53 04/25/2018 0408   BILITOT 0.6 04/25/2018 0408   GFRNONAA >60 04/26/2018 0500   GFRAA >60 04/26/2018 0500   Lipase     Component Value Date/Time   LIPASE 26 04/17/2018 0353    Studies/Results: Dg Chest Port 1 View  Result Date: 04/24/2018 CLINICAL DATA:  Chest pain and shortness of breath.  Leukocytosis. EXAM: PORTABLE CHEST 1 VIEW COMPARISON:  03/31/2018. FINDINGS: Interval nasogastric tube with its tip at the level of the clavicles and side hole in the lower neck. Interval right PICC with its tip at the superior cavoatrial junction. Normal sized heart. Decreased inspiration with bibasilar airspace opacity, linear densities and small pleural effusions. Normal appearing bones. IMPRESSION: 1. Poor inspiration with bibasilar atelectasis and small  bilateral pleural effusions. Pneumonia cannot be excluded at either lung base. 2. Nasogastric tube tip at the level of the clavicles and side hole in the lower neck. Electronically Signed   By: Claudie Revering M.D.   On: 04/24/2018 19:27      Kalman Drape , [facility] 04/26/2018, 10:16 AM  Pager: [phone] Mon-Wed, Friday 7:00am-4:30pm Thurs 7am-11:30am  Consults: [phone]

## 2018-04-26 NOTE — Progress Notes (Signed)
Physical Therapy Treatment Patient Details Name: Bethany [name] MRN: [id number] DOB: Mar 08, 1956 Today's Date: 04/26/2018    History of Present Illness 62 year old female past medical history significant for osteoporosis with the right knee status post right total knee replacement on 04/12/2018 just discharged from our facility presents with worsening generalized abdominal pain.  She is found to have a partial small bowel obstruction likely related to inactivity and pain medications.    PT Comments    Pt progressing well towards mobility goals. Provided pt with total knee HEP handout and instructed pt to continue ther ex while admitted. Pt with very little reliance on RW during session. Consider trial w/o RW next session. Will continue to follow acutely.    Follow Up Recommendations  Home health PT;Supervision for mobility/OOB     Equipment Recommendations  None recommended by PT    Recommendations for Other Services       Precautions / Restrictions Precautions Precautions: Fall;Knee Precaution Booklet Issued: No Precaution Comments: pt still with knee precautions since surgery just one week ago, pt aware of proper positioning Restrictions Weight Bearing Restrictions: No RLE Weight Bearing: Weight bearing as tolerated    Mobility  Bed Mobility               General bed mobility comments: toileting on arrival  Transfers Overall transfer level: Needs assistance Equipment used: Rolling walker (2 wheeled) Transfers: Sit to/from Stand Sit to Stand: Supervision         General transfer comment: supervision for safety. No cues required  Ambulation/Gait Ambulation/Gait assistance: Supervision Gait Distance (Feet): 1500 Feet Assistive device: Rolling walker (2 wheeled) Gait Pattern/deviations: Step-through pattern     General Gait Details: Pt with good technique using RW. Overall steady with RW. Continues to lack heel strike on R. May be ready to attempt gait w/o  RW.   Stairs             Wheelchair Mobility    Modified Rankin (Stroke Patients Only)       Balance Overall balance assessment: Modified Independent Sitting-balance support: Feet supported Sitting balance-Leahy Scale: Good     Standing balance support: No upper extremity supported Standing balance-Leahy Scale: Fair Standing balance comment: Can statically stand w/o RW                            Cognition Arousal/Alertness: Awake/alert Behavior During Therapy: WFL for tasks assessed/performed Overall Cognitive Status: Within Functional Limits for tasks assessed                                        Exercises Other Exercises Other Exercises: Provided total knee HEP handout. Pt has already reviewed all exercises on previous admission.    General Comments        Pertinent Vitals/Pain Pain Assessment: Faces Faces Pain Scale: Hurts a little bit Pain Location: R knee and abdomen Pain Descriptors / Indicators: Aching;Sore;Discomfort;Grimacing;Guarding;Operative site guarding Pain Intervention(s): Monitored during session;Limited activity within patient's tolerance;Repositioned    Home Living                      Prior Function            PT Goals (current goals can now be found in the care plan section) Acute Rehab PT Goals Patient Stated Goal: return home PT Goal Formulation: With  patient Time For Goal Achievement: 05/02/18 Potential to Achieve Goals: Good    Frequency    Min 4X/week      PT Plan Current plan remains appropriate    Co-evaluation              AM-PAC PT "6 Clicks" Daily Activity  Outcome Measure  Difficulty turning over in bed (including adjusting bedclothes, sheets and blankets)?: None Difficulty moving from lying on back to sitting on the side of the bed? : A Little Difficulty sitting down on and standing up from a chair with arms (e.g., wheelchair, bedside commode, etc,.)?: A  Little Help needed moving to and from a bed to chair (including a wheelchair)?: A Little Help needed walking in hospital room?: A Little Help needed climbing 3-5 steps with a railing? : A Little 6 Click Score: 19    End of Session Equipment Utilized During Treatment: Gait belt Activity Tolerance: Patient tolerated treatment well Patient left: with call bell/phone within reach;in chair;with family/visitor present(husband present and verbalized need to call nsg if leaves) Nurse Communication: Mobility status PT Visit Diagnosis: Pain;Difficulty in walking, not elsewhere classified (R26.2);Muscle weakness (generalized) (M62.81) Pain - Right/Left: Right Pain - part of body: Knee     Time: 1505-6979 PT Time Calculation (min) (ACUTE ONLY): 34 min  Charges:  $Gait Training: 23-37 mins                    G Codes:      Benjiman Core, [place] Pager [phone] Acute Rehab  Allena Katz 04/26/2018, 3:03 PM

## 2018-04-26 NOTE — Progress Notes (Signed)
PROGRESS NOTE                                                                                                                                                                                                             Patient Demographics:    Bethany [name], is a 62 y.o. female, DOB - [birth year], XBL:[id number]  Admit date - 04/17/2018   Admitting Physician Lady Deutscher, MD  Outpatient Primary MD for the patient is Denita Lung, MD  LOS - 9   Chief Complaint  Patient presents with  . Constipation  . Abdominal Pain       Brief Narrative   Bethany [name] is a 62 year old female with past medical history significant for osteoarthritis of the right knee status post right total knee replacement on 04/12/2018 who presented with worsening generalized abdominal pain.  CT abdomen pelvis with contrast done on 04/17/2018 revealed moderate to high-grade partial small bowel obstruction.  Prior hysterectomy noted on imaging.  Recently on opiates however stated that she was taking laxative and stool softener along with her pain medications.  History of tubular adenoma of colon one year ago.  Admitted for partial small bowel obstruction.    No improvement with NG tube n.p.o. with IV fluids, pain been going for exploratory protamine where she did have distal ileum obstruction status post resection, postoperative course complicated by ileus   Subjective:    [name] today denies any fever or chills, tolerating clear liquid diet, passing small amount of flatus  Assessment  & Plan :    Principal Problem:   SBO (small bowel obstruction) (HCC) Active Problems:   Raynaud's disease   Recurrent herpes labialis   Mood disorder (HCC)   Tubular adenoma of colon   Primary osteoarthritis of right knee   IBS (irritable bowel syndrome)    Partial small bowel obstruction. - With initial improvement after NGT insertion, but with recurrent evidence of small bowel  obstruction on imaging, unfortunately after discontinuing her NG tube she had recurrence of symptoms, so general surgery input greatly appreciated, status post exploratory laparotomy 04/22/2018 with lysis of adhesions and small bowel resection for chronic stenosis distal ileum secondary to long-standing adhesions . -Appears to be having postoperative ileus, climb inserted and started on TPN -Cytosis trending down, abdominal exam is benign. -He is to use incentive spirometer  and ambulate today, as chest x-ray significant for atelectasis. -Diet per general surgery, started on clear liquid diet, so far tolerating . Status post right total knee replacement  - appears stable , continue with as needed pain medication, continue with PT and ambulation Weightbearing as tolerated as recommended by orthopedic surgery Pain management with IV Toradol due to partial small bowel obstruction  Hyperlipidemia - Continue to hold statin due to n.p.o.  hypokalemia - repleted,   Hypernatremia -Resolved,   Chronic macrocytic anemia - Hemoglobin 10.7; baseline hemoglobin 12 - MCV 101.9, b12 WnL .  Ambulatory dysfunction due to recent R knee surgery - He has been followed by PT, she is ambulating in the hallway few times every day     Code Status : Full  Family Communication  : None at bedside  Disposition Plan  : Home when stable  Consults  :  General surgery  Procedures  : none  DVT Prophylaxis  :  [place] lovenox  Lab Results  Component Value Date   PLT 322 04/26/2018    Antibiotics  :    Anti-infectives (From admission, onward)   None        Objective:   Vitals:   04/25/18 1249 04/25/18 2003 04/26/18 0333 04/26/18 1350  BP: (!) 155/80 (!) 142/75 140/66 (!) 149/74  Pulse: 82 83 90 75  Resp: 18 16 16 14   Temp: 98 F (36.7 C) 98.5 F (36.9 C) 98.6 F (37 C) 98 F (36.7 C)  TempSrc: Oral Oral Oral Oral  SpO2: 100% 96% 92% 98%  Weight:      Height:        Wt Readings  from Last 3 Encounters:  04/24/18 65.7 kg (144 lb 13.5 oz)  04/12/18 55.1 kg (121 lb 6.4 oz)  03/31/18 55.1 kg (121 lb 6.4 oz)     Intake/Output Summary (Last 24 hours) at 04/26/2018 1538 Last data filed at 04/26/2018 1350 Gross per 24 hour  Intake -  Output 1000 ml  Net -1000 ml     Physical Exam  Awake Alert, Oriented X 3, No new F.N deficits, Normal affect Symmetrical Chest wall movement, Good air movement bilaterally, CTAB RRR,No Gallops,Rubs or new Murmurs, No Parasternal Heave +ve B.Sounds slightly diminished, midline surgical wound with honeycomb mesh cover, minimally tender, mildly distended Surgical scar with honey comb mesh cover, Abd Soft, No tenderness, No organomegaly appriciated, No rebound - guarding or rigidity. No Cyanosis, Clubbing or edema, No new Rash or bruise        Data Review:    CBC Recent Labs  Lab 04/22/18 0702 04/23/18 0440 04/24/18 0525 04/25/18 0408 04/26/18 0500  WBC 10.4 16.3* 21.9* 13.4* 14.1*  HGB 11.7* 12.3 12.6 10.3* 10.5*  HCT 36.4 36.9 40.0 31.4* 31.9*  PLT 312 337 428* 309 322  MCV 105.2* 100.8* 105.3* 104.0* 100.3*  MCH 33.8 33.6 33.2 34.1* 33.0  MCHC 32.1 33.3 31.5 32.8 32.9  RDW 12.6 12.3 12.7 12.4 12.1  LYMPHSABS  --   --  1.6 0.8  --   MONOABS  --   --  1.0 0.6  --   EOSABS  --   --  0.3 0.3  --   BASOSABS  --   --  0.1 0.0  --     Chemistries  Recent Labs  Lab 04/21/18 0654 04/22/18 0702 04/23/18 0440 04/24/18 0525 04/25/18 0408 04/26/18 0500  NA 145 144 140 138  140 135 134*  K 3.8 3.9 4.1 3.9  3.8 4.0 3.6  CL 111 109 104 99*  101 99* 98  CO2 30 29 31 31  31 30 28   GLUCOSE 109* 102* 136* 126*  134* 108* 114*  BUN 14 13 9 8  9 7  7*  CREATININE 0.67 0.84 0.72 0.80  0.80 0.62 0.62  CALCIUM 8.6* 8.8* 8.3* 8.8*  8.9 8.2* 8.0*  MG 1.8 1.9  --  1.8 1.8 1.9  AST  --   --   --  29 23  --   ALT  --   --   --  21 17  --   ALKPHOS  --   --   --  67 53  --   BILITOT  --   --   --  0.8 0.6  --     ------------------------------------------------------------------------------------------------------------------ Recent Labs    04/24/18 0525 04/25/18 0408  TRIG 156* 106    No results found for: HGBA1C ------------------------------------------------------------------------------------------------------------------ No results for input(s): TSH, T4TOTAL, T3FREE, THYROIDAB in the last 72 hours.  Invalid input(s): FREET3 ------------------------------------------------------------------------------------------------------------------ No results for input(s): VITAMINB12, FOLATE, FERRITIN, TIBC, IRON, RETICCTPCT in the last 72 hours.  Coagulation profile No results for input(s): INR, PROTIME in the last 168 hours.  No results for input(s): DDIMER in the last 72 hours.  Cardiac Enzymes No results for input(s): CKMB, TROPONINI, MYOGLOBIN in the last 168 hours.  Invalid input(s): CK ------------------------------------------------------------------------------------------------------------------ No results found for: BNP  Inpatient Medications  Scheduled Meds: . acetaminophen  650 mg Oral Q6H  . enoxaparin (LOVENOX) injection  40 mg Subcutaneous Q24H  . feeding supplement  1 Container Oral BID BM  . mouth rinse  15 mL Mouth Rinse BID  . sodium chloride flush  10-40 mL Intracatheter Q12H   Continuous Infusions: . dextrose 5% lactated ringers with KCl 20 mEq/L 65 mL/hr at 04/26/18 1320  . TPN ADULT (ION) 60 mL/hr at 04/25/18 1737  . TPN ADULT (ION)     PRN Meds:.bisacodyl, HYDROmorphone (DILAUDID) injection, LORazepam, ondansetron **OR** ondansetron (ZOFRAN) IV, oxyCODONE, phenol, sodium chloride flush  Micro Results No results found for this or any previous visit (from the past 240 hour(s)).  Radiology Reports Dg Chest 2 View  Result Date: 04/01/2018 CLINICAL DATA:  Preoperative for right total knee arthroplasty. EXAM: CHEST - 2 VIEW COMPARISON:  None. FINDINGS: Normal  heart size. Normal mediastinal contour. No pneumothorax. No pleural effusion. Lungs appear clear, with no acute consolidative airspace disease and no pulmonary edema. IMPRESSION: No active cardiopulmonary disease. Electronically Signed   By: Ilona Sorrel M.D.   On: 04/01/2018 08:15   Dg Abd 1 View  Result Date: 04/21/2018 CLINICAL DATA:  Small bowel obstruction. EXAM: ABDOMEN - 1 VIEW COMPARISON:  Radiographs of April 20, 2018. FINDINGS: Stable small bowel dilatation is noted concerning for distal small bowel obstruction. No colonic dilatation is noted. Phleboliths are noted in the pelvis. IMPRESSION: Stable small bowel dilatation concerning for distal small bowel obstruction. Electronically Signed   By: Marijo Conception, M.D.   On: 04/21/2018 08:38   Dg Abd 1 View  Result Date: 04/20/2018 CLINICAL DATA:  Evaluate for small bowel obstruction and NG tube placement EXAM: ABDOMEN - 1 VIEW COMPARISON:  04/19/2018 FINDINGS: NG tube tip is in the distal esophagus with the side port in the mid esophagus. This should be advanced several cm. Small bowel dilatation noted in the abdomen compatible with small bowel obstruction as seen on prior study. Bibasilar atelectasis. IMPRESSION: NG tube tip in the distal esophagus.  Recommend advancing several cm. Small bowel obstruction pattern, stable. Electronically Signed   By: Rolm Baptise M.D.   On: 04/20/2018 08:16   Dg Abd 1 View  Result Date: 04/19/2018 CLINICAL DATA:  Small bowel obstruction. EXAM: ABDOMEN - 1 VIEW COMPARISON:  CT abdomen and pelvis April 17, 2018; abdominal radiograph April 18, 2018 FINDINGS: Nasogastric tube tip is in the gastric cardia region. The side-port is not seen but is felt to be above the diaphragm. There remain loops of dilated small bowel, similar to 1 day prior. No free air. There are phleboliths in the pelvis. IMPRESSION: Findings indicative of a degree of persistent bowel obstruction. No free air. Nasogastric tube tip in proximal stomach  with side port above the diaphragm. Advise advancing nasogastric tube approximately 8 cm to insure that side-port as well as nasogastric tube tip are within the stomach. Electronically Signed   By: Lowella Grip III M.D.   On: 04/19/2018 07:55   Ct Abdomen Pelvis W Contrast  Result Date: 04/17/2018 CLINICAL DATA:  Constipation, abdominal pain, vomiting EXAM: CT ABDOMEN AND PELVIS WITH CONTRAST TECHNIQUE: Multidetector CT imaging of the abdomen and pelvis was performed using the standard protocol following bolus administration of intravenous contrast. CONTRAST:  155mL OMNIPAQUE IOHEXOL 300 MG/ML  SOLN COMPARISON:  None. FINDINGS: Lower chest: Bilateral breast implants partially imaged. Linear atelectasis or scarring at the right base. No effusions. Hepatobiliary: No focal hepatic abnormality. Gallbladder unremarkable. Pancreas: No focal abnormality or ductal dilatation. Spleen: No focal abnormality.  Normal size. Adrenals/Urinary Tract: No adrenal abnormality. No focal renal abnormality. No stones or hydronephrosis. Urinary bladder is unremarkable. Stomach/Bowel: Dilated fluid-filled small bowel loops with air-fluid levels noted throughout the abdomen and pelvis. Distal small bowel loops appear decompressed. Exact transition is not visualize, but findings compatible with moderate to high-grade partial small bowel obstruction. Large bowel decompressed, grossly unremarkable. Vascular/Lymphatic: No evidence of aneurysm or adenopathy. Reproductive: Prior hysterectomy.  No adnexal masses. Other: Small amount of free fluid in the pelvis.  No free air. Musculoskeletal: No acute bony abnormality. L5 related grade 1 anterolisthesis of to facet disease L4 on. Degenerative disc disease at L4-5. IMPRESSION: Findings compatible with moderate to high-grade partial small bowel obstruction. Exact transition and cause not visualized, presumably adhesions. Prior hysterectomy. Electronically Signed   By: Rolm Baptise M.D.   On:  04/17/2018 09:11   Dg Chest Port 1 View  Result Date: 04/24/2018 CLINICAL DATA:  Chest pain and shortness of breath.  Leukocytosis. EXAM: PORTABLE CHEST 1 VIEW COMPARISON:  03/31/2018. FINDINGS: Interval nasogastric tube with its tip at the level of the clavicles and side hole in the lower neck. Interval right PICC with its tip at the superior cavoatrial junction. Normal sized heart. Decreased inspiration with bibasilar airspace opacity, linear densities and small pleural effusions. Normal appearing bones. IMPRESSION: 1. Poor inspiration with bibasilar atelectasis and small bilateral pleural effusions. Pneumonia cannot be excluded at either lung base. 2. Nasogastric tube tip at the level of the clavicles and side hole in the lower neck. Electronically Signed   By: Claudie Revering M.D.   On: 04/24/2018 19:27   Dg Abd Portable 1v  Result Date: 04/22/2018 CLINICAL DATA:  Follow up small bowel obstruction EXAM: PORTABLE ABDOMEN - 1 VIEW COMPARISON:  04/21/2018 FINDINGS: Multiple dilated loops of small bowel are again identified roughly similar to that seen on the prior exam. The bladder is distended. No free air is seen. No acute bony abnormality is noted. IMPRESSION: Stable small bowel dilatation.  Electronically Signed   By: Inez Catalina M.D.   On: 04/22/2018 07:02   Dg Abd Portable 1v-small Bowel Obstruction Protocol-24 Hr Delay  Result Date: 04/18/2018 CLINICAL DATA:  Small-bowel obstruction, 24 hour delay EXAM: PORTABLE ABDOMEN - 1 VIEW COMPARISON:  Portable exam 1635 hours compared to 04/18/2018 FINDINGS: Tip of orogastric tube projects over proximal stomach though the proximal side-port projects over the distal esophagus; recommend advancing tube 7 cm. Small amount gas within stomach. Persistent significant gaseous distention of small bowel loops consistent with small bowel obstruction, small bowel measuring up to 4.2 cm transverse slightly less than the 4.6 cm on the prior exam. No colonic gas.  Questionable mild wall thickening of a single small bowel loop in the LEFT upper quadrant. New osseous structures unremarkable. Lung bases clear. IMPRESSION: Persistent small bowel obstruction. Electronically Signed   By: Lavonia Dana M.D.   On: 04/18/2018 16:57   Dg Abd Portable 1v-small Bowel Obstruction Protocol-initial, 8 Hr Delay  Result Date: 04/18/2018 CLINICAL DATA:  Follow up small bowel obstruction. EXAM: PORTABLE ABDOMEN - 1 VIEW COMPARISON:  Abdominal radiograph April 17, 2018 FINDINGS: Persistent gas distended small bowel measuring to 4.2 cm. Paucity of large bowel gas. Nasogastric tube tip projects at proximal stomach. Phleboliths project in the pelvis. Soft tissue and osseous structures non suspicious. IMPRESSION: Unchanged small bowel obstruction. Nasogastric tube tip projects in proximal stomach. Electronically Signed   By: Elon Alas M.D.   On: 04/18/2018 01:42   Dg Abd Portable 1v-small Bowel Protocol-position Verification  Result Date: 04/17/2018 CLINICAL DATA:  Confirm NG tube placement. EXAM: PORTABLE ABDOMEN - 1 VIEW COMPARISON:  04/17/2018. FINDINGS: Enteric tube is identified with tip 2.8 cm below the level of the GE junction. The side port is 5 cm above the GE junction. Persistent small bowel dilatation compatible with obstruction. IMPRESSION: The tip of the NG tube projects over the proximal stomach just below the level of the GE junction and the side port is above the GE junction. Advise further tube advancement such that the side port is at least below the GE junction. Electronically Signed   By: Kerby Moors M.D.   On: 04/17/2018 10:50   [place] Ekg Site Rite  Result Date: 04/24/2018 If Site Rite image not attached, placement could not be confirmed due to current cardiac rhythm.    Phillips Climes M.D on 04/26/2018 at 3:38 PM  Between 7am to 7pm - Pager - [phone]  After 7pm go to [email or web address] - password [email or web address]  [facility] -  Office  [phone]

## 2018-04-26 NOTE — Progress Notes (Signed)
[place] NOTE   Pharmacy Consult for TPN Indication: prolonged ileus  Patient Measurements: Height: 5\' 5"  (165.1 cm) Weight: 144 lb 13.5 oz (65.7 kg) IBW/kg (Calculated) : 57 TPN AdjBW (KG): 54.9 Body mass index is 24.1 kg/m. Usual Weight: 55 kg  Assessment:  62 y/o female who had right TKR on 04/12/18 who presented to the ED on 04/17/18 with constipation, abdominal pain, and vomiting found to have a pSBO likely from chronic constipation in setting of opiate use vs adhesions. NGT placed and bowel rest initiated but she failed medical management and is s/p ex lap, LOA, and SBR on 04/22/18. She has had minimal nutrition over the last 9-10 days. Pharmacy consulted to begin TPN for ileus.  GI: post-op ileus, no nausea, passing gas. NGT pulled 6/24. LBM 6/19 Endo: no hx DM, SSI (sens) CBGs in low 100s Insulin requirements in the past 24 hours: 2 units Lytes: Na 134, Mg 1.9 s/p 2g IV (goal >=2 for ileus), K 3.6 (goal 4 for ileus), Cl low 98 Renal: SCr wnl, UOP down 0.4 ml/kg/hr. +170ml. D5LR + 20K at 65 ml/hr Pulm: RA Cards: VSS Hepatobil: LFT wnl, TG 156>106 Neuro: Still having high pain scores, PRN dilaudid + fentanyl ID: [id number], afeb  TPN Access: double lumen PICC 6/23 TPN start date: 6/23 >> Nutritional Goals (per RD recommendation on 6/24): KCal: 1750-1950 Protein: 85-100 Fluid: 1.7-1.9L  Current Nutrition:  TPN Clears Boost Breeze BID (ea provides 250 kcal, 9g protein)  Plan:  Continue TPN to 60 mL/hr This TPN provides 72 g of protein, 216 g of dextrose, and 28.8 g of lipids which provides  1310 kCals per day, with Boost Breeze (500 kcal and 16g protein a day) meeting ~100% of patient needs Electrolytes in TPN: Increase Mg, Na, K  (Cl: acetate 2:1) Add MVI, trace elements D/c sensitive SSI d/t control and current CBGs  Continue IV fluids (D5LR + 20K) at 65 ml/hr  K 67meq PO x 1 F/u volume status with fluids, tolerance to PO,  BMET, phos, Mg in AM  Bertis Ruddy, PharmD Pharmacy Resident [phone] 04/26/2018 7:32 AM

## 2018-04-27 LAB — CBC
HCT: 30.9 % — ABNORMAL LOW (ref 36.0–46.0)
HEMOGLOBIN: 9.9 g/dL — AB (ref 12.0–15.0)
MCH: 32.7 pg (ref 26.0–34.0)
MCHC: 32 g/dL (ref 30.0–36.0)
MCV: 102 fL — AB (ref 78.0–100.0)
Platelets: 317 10*3/uL (ref 150–400)
RBC: 3.03 MIL/uL — AB (ref 3.87–5.11)
RDW: 12.7 % (ref 11.5–15.5)
WBC: 11.3 10*3/uL — AB (ref 4.0–10.5)

## 2018-04-27 LAB — BASIC METABOLIC PANEL
Anion gap: 6 (ref 5–15)
BUN: 7 mg/dL — ABNORMAL LOW (ref 8–23)
CO2: 28 mmol/L (ref 22–32)
CREATININE: 0.56 mg/dL (ref 0.44–1.00)
Calcium: 8.5 mg/dL — ABNORMAL LOW (ref 8.9–10.3)
Chloride: 104 mmol/L (ref 98–111)
GFR calc non Af Amer: >60 mL/min (ref >60)
Glucose, Bld: 103 mg/dL — ABNORMAL HIGH (ref 70–99)
Potassium: 4.5 mmol/L (ref 3.5–5.1)
Sodium: 138 mmol/L (ref 135–145)

## 2018-04-27 LAB — MAGNESIUM: MAGNESIUM: 2 mg/dL (ref 1.7–2.4)

## 2018-04-27 LAB — GLUCOSE, CAPILLARY
GLUCOSE-CAPILLARY: 88 mg/dL (ref 70–99)
Glucose-Capillary: 97 mg/dL (ref 70–99)
Glucose-Capillary: 94 mg/dL (ref 70–99)
Glucose-Capillary: 96 mg/dL (ref 70–99)

## 2018-04-27 LAB — PHOSPHORUS: PHOSPHORUS: 3.8 mg/dL (ref 2.5–4.6)

## 2018-04-27 MED ORDER — DOCUSATE SODIUM 100 MG PO CAPS
100.0000 mg | ORAL_CAPSULE | Freq: Every day | ORAL | Status: DC
Start: 2018-04-27 — End: 2018-05-01
  Administered 2018-04-27 – 2018-05-01 (×5): 100 mg via ORAL
  Filled 2018-04-27 (×5): qty 1

## 2018-04-27 MED ORDER — POLYETHYLENE GLYCOL 3350 17 G PO PACK
17.0000 g | PACK | Freq: Every day | ORAL | Status: DC
  Administered 2018-04-27 – 2018-05-01 (×5): 17 g via ORAL
  Filled 2018-04-27 (×5): qty 1

## 2018-04-27 MED ORDER — HYDROMORPHONE HCL 1 MG/ML IJ SOLN
0.5000 mg | INTRAMUSCULAR | Status: DC | PRN
  Administered 2018-04-27 – 2018-04-29 (×10): 0.5 mg via INTRAVENOUS
  Filled 2018-04-27 (×10): qty 1

## 2018-04-27 MED ORDER — TRAVASOL 10 % IV SOLN
INTRAVENOUS | Status: AC
  Filled 2018-04-27: qty 360

## 2018-04-27 MED ORDER — TRAVASOL 10 % IV SOLN
INTRAVENOUS | Status: DC
  Administered 2018-04-27: 18:00:00 via INTRAVENOUS
  Filled 2018-04-27: qty 252

## 2018-04-27 MED ORDER — TRAMADOL HCL 50 MG PO TABS
50.0000 mg | ORAL_TABLET | Freq: Two times a day (BID) | ORAL | Status: DC
  Administered 2018-04-27 – 2018-05-01 (×8): 50 mg via ORAL
  Filled 2018-04-27 (×9): qty 1

## 2018-04-27 MED ORDER — OXYCODONE HCL 5 MG PO TABS
10.0000 mg | ORAL_TABLET | ORAL | Status: DC | PRN
  Administered 2018-04-27 – 2018-04-28 (×3): 15 mg via ORAL
  Administered 2018-04-28 (×2): 10 mg via ORAL
  Administered 2018-04-29 – 2018-04-30 (×6): 15 mg via ORAL
  Filled 2018-04-27: qty 3
  Filled 2018-04-27: qty 2
  Filled 2018-04-27 (×6): qty 3
  Filled 2018-04-27: qty 2
  Filled 2018-04-27 (×2): qty 3

## 2018-04-27 NOTE — Progress Notes (Signed)
Occupational Therapy Treatment Patient Details Name: Bethany [name] MRN: [id number] DOB: [birth year] Today's Date: 04/27/2018    History of present illness 62 year old female past medical history significant for osteoporosis with the right knee status post right total knee replacement on 04/12/2018 just discharged from our facility presents with worsening generalized abdominal pain.  She is found to have a partial small bowel obstruction likely related to inactivity and pain medications.   OT comments  Pt requesting to ambulate around entire unit for second time after working with mobility specialist. Demonstrating ability to don and doff R sock long sitting in bed, toilet modified independently and groom a sink with supervision and line management. Conversation with pt much clearer to follow than last visit.   Follow Up Recommendations  No OT follow up    Equipment Recommendations  None recommended by OT    Recommendations for Other Services      Precautions / Restrictions Precautions Precautions: Fall;Knee Restrictions Weight Bearing Restrictions: No RLE Weight Bearing: Weight bearing as tolerated       Mobility Bed Mobility Overal bed mobility: Modified Independent             General bed mobility comments: HOB up  Transfers Overall transfer level: Modified independent Equipment used: Rolling walker (2 wheeled)             General transfer comment: cues to avoid putting R foot out    Balance                                           ADL either performed or assessed with clinical judgement   ADL Overall ADL's : Needs assistance/impaired     Grooming: Wash/dry hands;Supervision/safety;Standing           Upper Body Dressing : Set up;Standing   Lower Body Dressing: Set up;Bed level(long sitting)   Toilet Transfer: Supervision/safety;Ambulation;RW   Toileting- Clothing Manipulation and Hygiene: Modified independent;Sit to/from  stand       Functional mobility during ADLs: Supervision/safety;Rolling walker(to manage IV line) General ADL Comments: encouraged pt to stop putting R foot out during sit<>stand     Vision       Perception     Praxis      Cognition Arousal/Alertness: Awake/alert Behavior During Therapy: WFL for tasks assessed/performed Overall Cognitive Status: Within Functional Limits for tasks assessed                                 General Comments: much clearer        Exercises     Shoulder Instructions       General Comments      Pertinent Vitals/ Pain       Pain Assessment: 0-10 Pain Score: 8  Pain Location: R knee and abdomen Pain Descriptors / Indicators: Aching;Sore;Discomfort;Grimacing;Guarding;Operative site guarding  Home Living                                          Prior Functioning/Environment              Frequency  Min 2X/week        Progress Toward Goals  OT Goals(current goals can now be found in the care plan  section)  Progress towards OT goals: Progressing toward goals  Acute Rehab OT Goals Patient Stated Goal: return home OT Goal Formulation: With patient Time For Goal Achievement: 05/02/18 Potential to Achieve Goals: Good  Plan Discharge plan remains appropriate    Co-evaluation                 AM-PAC PT "6 Clicks" Daily Activity     Outcome Measure   Help from another person eating meals?: None Help from another person taking care of personal grooming?: None Help from another person toileting, which includes using toliet, bedpan, or urinal?: None Help from another person bathing (including washing, rinsing, drying)?: A Little Help from another person to put on and taking off regular upper body clothing?: None Help from another person to put on and taking off regular lower body clothing?: A Little 6 Click Score: 22    End of Session Equipment Utilized During Treatment: Gait belt;Rolling  walker  OT Visit Diagnosis: Other abnormalities of gait and mobility (R26.89);Pain   Activity Tolerance Patient tolerated treatment well   Patient Left in bed;with call bell/phone within reach;with nursing/sitter in room   Nurse Communication Other (comment)(wants anxiety meds)        Time: 7588-3254 OT Time Calculation (min): 24 min  Charges: OT General Charges $OT Visit: 1 Visit OT Treatments $Self Care/Home Management : 8-22 mins $Therapeutic Activity: 8-22 mins  04/27/2018 Nestor Lewandowsky, OTR/L Pager: [phone]   Malka So 04/27/2018, 10:03 AM

## 2018-04-27 NOTE — Progress Notes (Signed)
PROGRESS NOTE    Bethany [name]  ZOX:[id number] DOB: 01-24-1956 DOA: 04/17/2018 PCP: Denita Lung, MD     Brief Narrative:  Bethany [name] is a 62 year old female with past medical history significant for osteoarthritis of the right knee status post right total knee replacement on 04/12/2018 who presented with worsening generalized abdominal pain. CT abdomen pelvis with contrast done on 04/17/2018 revealed moderate to high-grade partial small bowel obstruction. Prior hysterectomy noted on imaging. Recently on opiates however stated that she was taking laxative and stool softener along with her pain medications. History of tubular adenoma of colon one year ago. Admitted for partial small bowel obstruction. She underwent ex lap with lysis of adhesions and status post resection of small bowel, postoperative course complicated by ileus.  New events last 24 hours / Subjective: No acute events. Tolerating liquid diet, passed some gas but no BM yet. Abdomen feels better although slightly distended yet.   Assessment & Plan:   Principal Problem:   SBO (small bowel obstruction) (HCC) Active Problems:   Raynaud's disease   Recurrent herpes labialis   Mood disorder (HCC)   Tubular adenoma of colon   Primary osteoarthritis of right knee   IBS (irritable bowel syndrome)   Small bowel obstruction -Status post exploratory laparotomy 04/22/2018 with lysis of adhesions and small bowel resection for chronic stenosis distal ileum secondary to long-standing adhesions -Appears to be having postoperative ileus -Full liquid diet, continue TPN  -General surgery following -On reglan, monitor QTc   Chronic OA s/p right total knee replacement -Continue PT OT, recommending home health PT   Hyperlipidemia -Continue to hold statin until oral intake stable   Chronic macrocytic anemia -Baseline hemoglobin 12 -Continue to monitor    DVT prophylaxis: Lovenox  Code Status: Full Family  Communication: No family at bedside Disposition Plan: Pending clinical improvement, home with home health   Consultants:   General surgery   Procedures:   S/P exploratory laparotomy,enterolysis, sb resection - 04/22/2018, Dr. Brantley Stage  Antimicrobials:  Anti-infectives (From admission, onward)   None       Objective: Vitals:   04/26/18 0333 04/26/18 1350 04/26/18 2134 04/27/18 0442  BP: 140/66 (!) 149/74 120/88 (!) 144/72  Pulse: 90 75 71 72  Resp: 16 14 18 18   Temp: 98.6 F (37 C) 98 F (36.7 C) 97.8 F (36.6 C) 98.2 F (36.8 C)  TempSrc: Oral Oral Oral Oral  SpO2: 92% 98% 100% 100%  Weight:    62.6 kg (138 lb 0.1 oz)  Height:        Intake/Output Summary (Last 24 hours) at 04/27/2018 1254 Last data filed at 04/27/2018 1015 Gross per 24 hour  Intake 4680.86 ml  Output 600 ml  Net 4080.86 ml   Filed Weights   04/24/18 0500 04/24/18 0915 04/27/18 0442  Weight: 65.7 kg (144 lb 13.5 oz) 65.7 kg (144 lb 13.5 oz) 62.6 kg (138 lb 0.1 oz)    Examination:  General exam: Appears calm and comfortable  Respiratory system: Clear to auscultation. Respiratory effort normal. Cardiovascular system: S1 & S2 heard, RRR. No JVD, murmurs, rubs, gallops or clicks. No pedal edema. Gastrointestinal system: Abdomen is mildly distended, soft and nontender. Midline incision is clean and dry with staples in place. No organomegaly or masses felt. Some bowel sounds heard. Central nervous system: Alert and oriented. No focal neurological deficits. Extremities: Symmetric, right knee incision with dressing in place with ice pack on top  Skin: No rashes, lesions or ulcers Psychiatry:  Judgement and insight appear normal. Mood & affect appropriate.   Data Reviewed: I have personally reviewed following labs and imaging studies  CBC: Recent Labs  Lab 04/23/18 0440 04/24/18 0525 04/25/18 0408 04/26/18 0500 04/27/18 0827  WBC 16.3* 21.9* 13.4* 14.1* 11.3*  NEUTROABS  --  18.7* 11.5*  --    --   HGB 12.3 12.6 10.3* 10.5* 9.9*  HCT 36.9 40.0 31.4* 31.9* 30.9*  MCV 100.8* 105.3* 104.0* 100.3* 102.0*  PLT 337 428* 309 322 938   Basic Metabolic Panel: Recent Labs  Lab 04/22/18 0702 04/23/18 0440 04/24/18 0525 04/25/18 0408 04/26/18 0500 04/27/18 0430  NA 144 140 138  140 135 134* 138  K 3.9 4.1 3.9  3.8 4.0 3.6 4.5  CL 109 104 99*  101 99* 98 104  CO2 29 31 31  31 30 28 28   GLUCOSE 102* 136* 126*  134* 108* 114* 103*  BUN 13 9 8  9 7  7* 7*  CREATININE 0.84 0.72 0.80  0.80 0.62 0.62 0.56  CALCIUM 8.8* 8.3* 8.8*  8.9 8.2* 8.0* 8.5*  MG 1.9  --  1.8 1.8 1.9 2.0  PHOS  --   --  3.4 2.8 3.5 3.8   GFR: Estimated Creatinine Clearance: 65.6 mL/min (by C-G formula based on SCr of 0.56 mg/dL). Liver Function Tests: Recent Labs  Lab 04/24/18 0525 04/25/18 0408  AST 29 23  ALT 21 17  ALKPHOS 67 53  BILITOT 0.8 0.6  PROT 6.0* 5.1*  ALBUMIN 3.1* 2.5*   No results for input(s): LIPASE, AMYLASE in the last 168 hours. No results for input(s): AMMONIA in the last 168 hours. Coagulation Profile: No results for input(s): INR, PROTIME in the last 168 hours. Cardiac Enzymes: No results for input(s): CKTOTAL, CKMB, CKMBINDEX, TROPONINI in the last 168 hours. BNP (last 3 results) No results for input(s): PROBNP in the last 8760 hours. HbA1C: No results for input(s): HGBA1C in the last 72 hours. CBG: Recent Labs  Lab 04/26/18 1940 04/26/18 2345 04/27/18 0440 04/27/18 0740 04/27/18 1244  GLUCAP 116* 109* 97 94 88   Lipid Profile: Recent Labs    04/25/18 0408  TRIG 106   Thyroid Function Tests: No results for input(s): TSH, T4TOTAL, FREET4, T3FREE, THYROIDAB in the last 72 hours. Anemia Panel: No results for input(s): VITAMINB12, FOLATE, FERRITIN, TIBC, IRON, RETICCTPCT in the last 72 hours. Sepsis Labs: No results for input(s): PROCALCITON, LATICACIDVEN in the last 168 hours.  No results found for this or any previous visit (from the past 240 hour(s)).      Radiology Studies: No results found.    Scheduled Meds: . acetaminophen  650 mg Oral Q6H  . docusate sodium  100 mg Oral Daily  . enoxaparin (LOVENOX) injection  40 mg Subcutaneous Q24H  . feeding supplement  1 Container Oral BID BM  . mouth rinse  15 mL Mouth Rinse BID  . metoCLOPramide (REGLAN) injection  10 mg Intravenous Q6H  . polyethylene glycol  17 g Oral Daily  . sodium chloride flush  10-40 mL Intracatheter Q12H  . traMADol  50 mg Oral Q12H   Continuous Infusions: . TPN ADULT (ION) 30 mL/hr at 04/27/18 1046  . TPN ADULT (ION)       LOS: 10 days    Time spent: 35 minutes   Dessa Phi, DO [facility] [email or web address] Password [facility] 04/27/2018, 12:54 PM

## 2018-04-27 NOTE — Progress Notes (Signed)
[facility] NOTE   Pharmacy Consult for TPN Indication: prolonged ileus  Patient Measurements: Height: 5\' 5"  (165.1 cm) Weight: 138 lb 0.1 oz (62.6 kg) IBW/kg (Calculated) : 57 TPN AdjBW (KG): 54.9 Body mass index is 22.97 kg/m. Usual Weight: 55 kg  Assessment:  62 y/o female who had right TKR on 04/12/18 who presented to the ED on 04/17/18 with constipation, abdominal pain, and vomiting found to have a pSBO likely from chronic constipation in setting of opiate use vs adhesions. NGT placed and bowel rest initiated but she failed medical management and is s/p ex lap, LOA, and SBR on 04/22/18. She has had minimal nutrition over the last 9-10 days. Pharmacy consulted to begin TPN for ileus.  GI: post-op ileus, no nausea, passing gas but still distention. Reglan added. NGT pulled 6/24. LBM 6/19 Endo: no hx DM- CBGs low (SSI d/c 6/25) Insulin requirements in the past 24 hours: n/a Lytes: Cl low 104 improving, Na low normal 138 Renal: SCr wnl, UOP 0.5 ml/kg/hr. +4.1L. D5LR + 20K at 65 ml/hr Pulm: RA Cards: VSS Hepatobil: LFT wnl, TG 156>106 Neuro: Still having high pain scores, PRN dilaudid + fentanyl ID: WBC [id number], afeb  TPN Access: double lumen PICC 6/23 TPN start date: 6/23 >> Nutritional Goals (per RD recommendation on 6/24): KCal: 1750-1950 Protein: 85-100 Fluid: 1.7-1.9L  Current Nutrition:  TPN Clears Boost Breeze BID (ea provides 250 kcal, 9g protein) - refused one d/t confusion over clear diet  Plan:  Decrease TPN to 30 mL/hr per surgery This TPN provides 25.2 g of protein, 108 g of dextrose, and 17.6 g of lipids which provides    644 kCals per day, ~ 37 % of patient needs - now advancing diet and weaning PTN Electrolytes in TPN: Decreases in Ca and slight decrease in phos with rate change d/t solubility limits (Cl: acetate 2:1) Add MVI, trace elements F/u toleration of diet, TPN labs   Bertis Ruddy, PharmD Pharmacy  Resident [phone] 04/27/2018 7:23 AM

## 2018-04-27 NOTE — Progress Notes (Signed)
[facility] Progress Note  5 Days Post-Op   Assessment/Plan Principal Problem: SBO (small bowel obstruction) (HCC) Active Problems: Raynaud's disease Recurrent herpes labialis Mood disorder (HCC) Tubular adenoma of colon Primary osteoarthritis of right knee IBS (irritable bowel syndrome)  S/P right knee replacement - 04/12/2018 - Dalldorf  SBO - S/P exploratory laparotomy,enterolysis, sb resection - 04/22/2018, Dr. Brantley Stage - no N/V, having flatus  FEN:IVF, 1/2TPN, FLD VTE: SCD's, lovenox KC:MKLK, WBC trending down, afebrile Foley:none Follow up:Dr. Cornett  DISPO:encourage ambulation and IS. 1/2 TPN today. FLD. Added bowel regiment.     LOS: 10 days    Subjective: CC: abdominal pain  Pt is doing well and tolerating CLD. No nausea or vomiting. Still having flatus, no BM. No new complaints.   Objective: Vital signs in last 24 hours: Temp:  [97.8 F (36.6 C)-98.2 F (36.8 C)] 98.2 F (36.8 C) (06/26 0442) Pulse Rate:  [71-75] 72 (06/26 0442) Resp:  [14-18] 18 (06/26 0442) BP: (120-149)/(72-88) 144/72 (06/26 0442) SpO2:  [98 %-100 %] 100 % (06/26 0442) Weight:  [62.6 kg (138 lb 0.1 oz)] 62.6 kg (138 lb 0.1 oz) (06/26 0442) Last BM Date: 04/24/18  Intake/Output from previous day: 06/25 0701 - 06/26 0700 In: 4798.9 [P.O.:695; I.V.:4103.9] Out: 800 [Urine:800] Intake/Output this shift: No intake/output data recorded.  PE: Gen: Alert, NAD, pleasant, cooperative Pulm:Rate andeffort normal Abd: Soft, ND, +BS, TTP around incision, incision with staples in place and is without surrounding erythema, drainage or signs of infection Skin: no rashes noted, warm and dry  Anti-infectives: Anti-infectives (From admission, onward)   None      Lab Results:  Recent Labs    04/26/18 0500 04/27/18 0827  WBC 14.1* 11.3*  HGB 10.5* 9.9*  HCT 31.9* 30.9*  PLT 322 317   BMET Recent Labs    04/26/18 0500  04/27/18 0430  NA 134* 138  K 3.6 4.5  CL 98 104  CO2 28 28  GLUCOSE 114* 103*  BUN 7* 7*  CREATININE 0.62 0.56  CALCIUM 8.0* 8.5*   PT/INR No results for input(s): LABPROT, INR in the last 72 hours. CMP     Component Value Date/Time   NA 138 04/27/2018 0430   K 4.5 04/27/2018 0430   CL 104 04/27/2018 0430   CO2 28 04/27/2018 0430   GLUCOSE 103 (H) 04/27/2018 0430   BUN 7 (L) 04/27/2018 0430   CREATININE 0.56 04/27/2018 0430   CREATININE 1.05 (H) 03/26/2017 0929   CALCIUM 8.5 (L) 04/27/2018 0430   PROT 5.1 (L) 04/25/2018 0408   ALBUMIN 2.5 (L) 04/25/2018 0408   AST 23 04/25/2018 0408   ALT 17 04/25/2018 0408   ALKPHOS 53 04/25/2018 0408   BILITOT 0.6 04/25/2018 0408   GFRNONAA >60 04/27/2018 0430   GFRAA >60 04/27/2018 0430   Lipase     Component Value Date/Time   LIPASE 26 04/17/2018 0353    Studies/Results: No results found.    Kalman Drape , [facility] Surgery 04/27/2018, 10:11 AM  Pager: [phone] Mon-Wed, Friday 7:00am-4:30pm Thurs 7am-11:30am  Consults: [phone]

## 2018-04-28 LAB — CBC
HEMATOCRIT: 31 % — AB (ref 36.0–46.0)
HEMOGLOBIN: 10.1 g/dL — AB (ref 12.0–15.0)
MCH: 33.3 pg (ref 26.0–34.0)
MCHC: 32.6 g/dL (ref 30.0–36.0)
MCV: 102.3 fL — AB (ref 78.0–100.0)
Platelets: 306 10*3/uL (ref 150–400)
RBC: 3.03 MIL/uL — AB (ref 3.87–5.11)
RDW: 12.7 % (ref 11.5–15.5)
WBC: 12.7 10*3/uL — AB (ref 4.0–10.5)

## 2018-04-28 LAB — COMPREHENSIVE METABOLIC PANEL
ALBUMIN: 2.7 g/dL — AB (ref 3.5–5.0)
ALT: 18 U/L (ref 0–44)
ANION GAP: 8 (ref 5–15)
AST: 18 U/L (ref 15–41)
Alkaline Phosphatase: 74 U/L (ref 38–126)
BILIRUBIN TOTAL: 0.4 mg/dL (ref 0.3–1.2)
BUN: 6 mg/dL — AB (ref 8–23)
CHLORIDE: 101 mmol/L (ref 98–111)
CO2: 27 mmol/L (ref 22–32)
Calcium: 8.2 mg/dL — ABNORMAL LOW (ref 8.9–10.3)
Creatinine, Ser: 0.57 mg/dL (ref 0.44–1.00)
GFR calc Af Amer: >60 mL/min (ref >60)
GFR calc non Af Amer: >60 mL/min (ref >60)
GLUCOSE: 100 mg/dL — AB (ref 70–99)
POTASSIUM: 4 mmol/L (ref 3.5–5.1)
SODIUM: 136 mmol/L (ref 135–145)
Total Protein: 5.5 g/dL — ABNORMAL LOW (ref 6.5–8.1)

## 2018-04-28 LAB — PHOSPHORUS: Phosphorus: 4.9 mg/dL — ABNORMAL HIGH (ref 2.5–4.6)

## 2018-04-28 LAB — MAGNESIUM: Magnesium: 1.9 mg/dL (ref 1.7–2.4)

## 2018-04-28 MED ORDER — ADULT MULTIVITAMIN W/MINERALS CH
1.0000 | ORAL_TABLET | Freq: Every day | ORAL | Status: DC
  Administered 2018-04-28 – 2018-05-01 (×4): 1 via ORAL
  Filled 2018-04-28 (×4): qty 1

## 2018-04-28 NOTE — Progress Notes (Signed)
Nutrition Follow-up  DOCUMENTATION CODES:   Not applicable  INTERVENTION:   -MVI with minerals daily -Snacks TID between meals -Magic Cup TID with meals, each supplement provides 290 kcals and 9 grams protein  NUTRITION DIAGNOSIS:   Inadequate oral intake related to altered GI function as evidenced by (just advanced to clear liquids, TPN dependent).  Progressing  GOAL:   Patient will meet greater than or equal to 90% of their needs  Progressing  MONITOR:   PO intake, Supplement acceptance, Diet advancement, Labs, Weight trends, Skin, I & O's  REASON FOR ASSESSMENT:   Malnutrition Screening Tool, Consult New TPN/TNA  ASSESSMENT:   62 year old female past medical history significant for osteoporosis with the right knee status post right total knee replacement on 04/12/2018 just discharged from our facility presents with worsening generalized abdominal pain.  She is found to have a partial small bowel obstruction likely related to inactivity and pain medications.  6/16- NGT placed 6/19- NGT pulled, replaced, clamping trials 6/20- advanced to clear liquid diet 6/21- s/p ex lap, enterolysis, sb resection 6/23- PICC placed, TPN initiated 6/26- NGT removed  Case discussed with RN, who reports pt is working with physical therapy currently. She reports pt was just advanced to a soft diet earlier today, but intake remains minimal. Documented meal completion 25%. Pt has been refusing both Boost Breeze and Ensure supplements.   Pt remains on TPN; infusing at 30 ml/hr, which provides 644 kcals and 25 grams protein, which meets 37% of estimated kcal needs and 29% of protein needs. Per discussion with RN, TPN will be d/c after this bag.   Labs reviewed.   Diet Order:   Diet Order           DIET SOFT Room service appropriate? Yes; Fluid consistency: Thin  Diet effective now          EDUCATION NEEDS:   Education needs have been addressed  Skin:  Skin Assessment: Skin  Integrity Issues: Skin Integrity Issues:: Incisions Incisions: closed rt knee, abdomen  Last BM:  04/24/18  Height:   Ht Readings from Last 1 Encounters:  04/22/18 5\' 5"  (1.651 m)    Weight:   Wt Readings from Last 1 Encounters:  04/27/18 138 lb 0.1 oz (62.6 kg)    Ideal Body Weight:  56.8 kg  BMI:  Body mass index is 22.97 kg/m.  Estimated Nutritional Needs:   Kcal:  5732-2025  Protein:  85-100 grams  Fluid:  1.7-1.9 L     Jalani Rominger A. Jimmye Norman, RD, LDN, CDE Pager: [phone] After hours Pager: [phone]

## 2018-04-28 NOTE — Progress Notes (Signed)
[facility] Progress Note  6 Days Post-Op  Subjective: CC: abdominal pain Patient still having abdominal pain but medication does help. Denies nausea, tolerating FLD. Passing flatus, no BM yet.   Objective: Vital signs in last 24 hours: Temp:  [97.3 F (36.3 C)-98.2 F (36.8 C)] 98 F (36.7 C) (06/27 0459) Pulse Rate:  [69-76] 69 (06/27 0459) Resp:  [14-16] 14 (06/27 0459) BP: (123-125)/(67-71) 125/71 (06/27 0459) SpO2:  [96 %-100 %] 99 % (06/27 0459) Last BM Date: 04/24/18  Intake/Output from previous day: 06/26 0701 - 06/27 0700 In: 1457.4 [P.O.:1184; I.V.:273.4] Out: -  Intake/Output this shift: No intake/output data recorded.  PE: Gen:  Alert, NAD, pleasant Card:  Regular rate and rhythm, pedal pulses 2+ BL Pulm:  Normal effort, clear to auscultation bilaterally Abd: Soft, appropriately tender, mildly distended, bowel sounds present, no HSM; incision with some purulence noted on dressing and surrounding erythema, removed some staples from middle portion of incision and expressed scant purulent drainage, no tracts extending horizontally from wound Skin: warm and dry, no rashes  Psych: A&Ox3   Lab Results:  Recent Labs    04/27/18 0827 04/28/18 0420  WBC 11.3* 12.7*  HGB 9.9* 10.1*  HCT 30.9* 31.0*  PLT 317 306   BMET Recent Labs    04/27/18 0430 04/28/18 0420  NA 138 136  K 4.5 4.0  CL 104 101  CO2 28 27  GLUCOSE 103* 100*  BUN 7* 6*  CREATININE 0.56 0.57  CALCIUM 8.5* 8.2*   PT/INR No results for input(s): LABPROT, INR in the last 72 hours. CMP     Component Value Date/Time   NA 136 04/28/2018 0420   K 4.0 04/28/2018 0420   CL 101 04/28/2018 0420   CO2 27 04/28/2018 0420   GLUCOSE 100 (H) 04/28/2018 0420   BUN 6 (L) 04/28/2018 0420   CREATININE 0.57 04/28/2018 0420   CREATININE 1.05 (H) 03/26/2017 0929   CALCIUM 8.2 (L) 04/28/2018 0420   PROT 5.5 (L) 04/28/2018 0420   ALBUMIN 2.7 (L) 04/28/2018 0420   AST 18 04/28/2018 0420    ALT 18 04/28/2018 0420   ALKPHOS 74 04/28/2018 0420   BILITOT 0.4 04/28/2018 0420   GFRNONAA >60 04/28/2018 0420   GFRAA >60 04/28/2018 0420   Lipase     Component Value Date/Time   LIPASE 26 04/17/2018 0353       Studies/Results: No results found.  Anti-infectives: Anti-infectives (From admission, onward)   None       Assessment/Plan Raynaud's disease Recurrent herpes labialis Mood disorder (HCC) Tubular adenoma of colon Primary osteoarthritis of right knee IBS (irritable bowel syndrome)  S/P right knee replacement - 04/12/2018 - Dalldorf  SBO - S/P exploratory laparotomy,enterolysis, sb resection - 04/22/2018, Dr. Brantley Stage - noN/V, having flatus Wound infection  - staples removed from mid portion of wound and saline moistened 2x2 packed into open wound - dressing changes BID - WBC 12.7, afebrile  YTK:ZSWF, d/c TPN, soft diet VTE: SCD's, lovenox ID:no current abx Foley:none Follow up:Dr. Cornett  DISPO:encourage ambulation and IS. d/c TPNtoday. Added bowel regimen. Advance diet. Dressing changes for wound infection.     LOS: 11 days    Brigid Re , [facility] Surgery 04/28/2018, 9:18 AM Pager: [phone] Consults: [phone] Mon-Fri 7:00 am-4:30 pm Sat-Sun 7:00 am-11:30 am

## 2018-04-28 NOTE — Progress Notes (Signed)
Physical Therapy Treatment Patient Details Name: Bethany [name] MRN: [id number] DOB: [birth year] Today's Date: 04/28/2018    History of Present Illness 62 year old female with past medical history s/p right total knee replacement on 04/12/2018 just discharged from our facility presented with worsening generalized abdominal pain.  readmitted to [facility] on 04/17/18 with  partial small bowel obstruction likely related to inactivity and pain medications; pt underwent abdominal surgery on  99/35/70  which was complicated by post op ileus, now further complicated by abd incision infection with mid incision staples removed, with W-->D  dsg per surgical team    PT Comments    Pt progressing well, encouraged knee AROM as pt is quite limited with flexion to be this far out from TKA; spoke with NT about reinforcing R knee AROM  And pt using gait belt to self stretch; will continue to follow acutely; pt would benefit from OPPT at d/c  Follow Up Recommendations  Outpatient PT     Equipment Recommendations  None recommended by PT    Recommendations for Other Services       Precautions / Restrictions Precautions Precautions: Fall;Knee Precaution Booklet Issued: No Precaution Comments: reinforced terminal knee extension, knee of bed locked at 0* and RN made aware; pt verbalizes knee precautions/reasoning Restrictions RLE Weight Bearing: Weight bearing as tolerated    Mobility  Bed Mobility Overal bed mobility: Modified Independent Bed Mobility: Supine to Sit;Sit to Supine     Supine to sit: Modified independent (Device/Increase time) Sit to supine: Modified independent (Device/Increase time)   General bed mobility comments: light useof rail, HOB elevated ~ 35*  Transfers Overall transfer level: Modified independent Equipment used: None Transfers: Sit to/from [facility] Sit to Stand: Modified independent (Device/Increase time) Stand pivot transfers: Supervision        General transfer comment: supervision for safety and lines with stand pivot  x2   Ambulation/Gait Ambulation/Gait assistance: Supervision Gait Distance (Feet): 500 Feet Assistive device: IV Pole;None Gait Pattern/deviations: Step-through pattern     General Gait Details: amb without AD, IV pole Left side; no overt LOB, mild instability and hesitancy initally without UE support; cues for heel strike on R and knee extension in R stance   Stairs             Wheelchair Mobility    Modified Rankin (Stroke Patients Only)       Balance     Sitting balance-Leahy Scale: Normal Sitting balance - Comments: weight shifts in all directions, abd pain limiting reach at times   Standing balance support: No upper extremity supported Standing balance-Leahy Scale: Fair Standing balance comment: fair, progressing to good--is able to amb short distance without UE support but unable to tolerate challenges                            Cognition Arousal/Alertness: Awake/alert Behavior During Therapy: WFL for tasks assessed/performed Overall Cognitive Status: Within Functional Limits for tasks assessed                                 General Comments: pt expresses much frustration with length of hospital stay and complications        Exercises Total Joint Exercises Quad Sets: AROM;5 reps;Right Heel Slides: AROM;AAROM;Right;10 reps Long Arc Quad: AROM;Right;5 reps;Seated Knee Flexion: AROM;AAROM;Right;5 reps;Seated Goniometric ROM: right knee AROM in sitting 6* to 60* flexion Other Exercises Other  Exercises: discussed pillow under ankle to promote full knee extension on right; reviewed knee precautions; provided pt with gait belt to use for assisted knee flexion and self stretching in bed; reviewed hamstring curls on R in supported standing and knee flexion stretch in sitting    General Comments        Pertinent Vitals/Pain Pain Assessment: Faces Faces  Pain Scale: Hurts even more Pain Location: R knee < abdomen Pain Descriptors / Indicators: Aching;Sore;Discomfort;Grimacing;Guarding;Operative site guarding Pain Intervention(s): Monitored during session;Patient requesting pain meds-RN notified;RN gave pain meds during session    Home Living                      Prior Function            PT Goals (current goals can now be found in the care plan section) Acute Rehab PT Goals Patient Stated Goal: get back to life PT Goal Formulation: With patient Time For Goal Achievement: 05/02/18 Potential to Achieve Goals: Good Progress towards PT goals: Progressing toward goals    Frequency    Min 3X/week      PT Plan Discharge plan needs to be updated;Frequency needs to be updated    Co-evaluation              AM-PAC PT "6 Clicks" Daily Activity  Outcome Measure  Difficulty turning over in bed (including adjusting bedclothes, sheets and blankets)?: None Difficulty moving from lying on back to sitting on the side of the bed? : None Difficulty sitting down on and standing up from a chair with arms (e.g., wheelchair, bedside commode, etc,.)?: None Help needed moving to and from a bed to chair (including a wheelchair)?: A Little Help needed walking in hospital room?: A Little Help needed climbing 3-5 steps with a railing? : A Little 6 Click Score: 21    End of Session   Activity Tolerance: Patient tolerated treatment well Patient left: in chair;with call bell/phone within reach Nurse Communication: Mobility status PT Visit Diagnosis: Pain;Difficulty in walking, not elsewhere classified (R26.2);Muscle weakness (generalized) (M62.81) Pain - Right/Left: Right Pain - part of body: Knee(and mid abdomen)     Time: 6834-1962 PT Time Calculation (min) (ACUTE ONLY): 29 min  Charges:  $Gait Training: 8-22 mins $Therapeutic Exercise: 8-22 mins                    G CodesKenyon Ana, PT Pager:  [phone] 04/28/2018    Kenyon Ana 04/28/2018, 2:24 PM

## 2018-04-28 NOTE — Progress Notes (Signed)
Subjective: 6 Days Post-Op Procedure(s) (LRB): EXPLORATORY LAPAROTOMY (N/A)   Patient states her knee is doing well. She is up and walking with PT. She is wondering about her bandage at this point and if it can be changed or discontinued.  Objective: Vital signs in last 24 hours: Temp:  [98 F (36.7 C)-98.2 F (36.8 C)] 98.1 F (36.7 C) (06/27 1323) Pulse Rate:  [69-83] 83 (06/27 1323) Resp:  [14-18] 18 (06/27 1323) BP: (123-139)/(67-71) 139/69 (06/27 1323) SpO2:  [96 %-99 %] 99 % (06/27 1323)  Labs: Recent Labs    04/26/18 0500 04/27/18 0827 04/28/18 0420  HGB 10.5* 9.9* 10.1*   Recent Labs    04/27/18 0827 04/28/18 0420  WBC 11.3* 12.7*  RBC 3.03* 3.03*  HCT 30.9* 31.0*  PLT 317 306   Recent Labs    04/27/18 0430 04/28/18 0420  NA 138 136  K 4.5 4.0  CL 104 101  CO2 28 27  BUN 7* 6*  CREATININE 0.56 0.57  GLUCOSE 103* 100*  CALCIUM 8.5* 8.2*   No results for input(s): LABPT, INR in the last 72 hours.  Physical Exam:  Neurologically intact Neurovascular intact Sensation intact distally Intact pulses distally Dorsiflexion/Plantar flexion intact Incision: no drainage No cellulitis present Compartment soft  Right knee ROM from 0-90 degrees, no effusion, incision healing well with no signs of infection.  Assessment/Plan:  6 Days Post-Op Procedure(s) (LRB): EXPLORATORY LAPAROTOMY (N/A)  s/p right TKR 04/12/17 He knee is doing very well. I changed out her bandage. It is ok for her to get her incision wet in the shower at this point but she should have a clean dry dressing on after bathing. Once she is discharged from the hospital she does not need to be on blood thinners for her knee. I will leave that decision up to the medical/surgical team. We will see her back in office once she is discharged home.   Haadiya Frogge, Larwance Sachs 04/28/2018, 1:47 PM

## 2018-04-28 NOTE — Progress Notes (Signed)
PROGRESS NOTE    Bethany [name]  VHQ:[id number] DOB: 04/08/1956 DOA: 04/17/2018 PCP: Denita Lung, MD     Brief Narrative:  Bethany [name] is a 62 year old female with past medical history significant for osteoarthritis of the right knee status post right total knee replacement on 04/12/2018 who presented with worsening generalized abdominal pain. CT abdomen pelvis with contrast done on 04/17/2018 revealed moderate to high-grade partial small bowel obstruction. Prior hysterectomy noted on imaging. Recently on opiates however stated that she was taking laxative and stool softener along with her pain medications. History of tubular adenoma of colon one year ago. Admitted for partial small bowel obstruction. She underwent ex lap with lysis of adhesions and status post resection of small bowel, postoperative course complicated by ileus.  New events last 24 hours / Subjective: Passing gas but no BM. She has been ambulatory. Abdomen tender after dressing change and partial staple removal.   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 EKG personally reviewed this morning, normal sinus with QTc 445  Chronic OA s/p right total knee replacement -Continue PT OT, recommending home health PT   Hyperlipidemia -Continue to hold statin until oral intake stable   Chronic macrocytic anemia -Baseline hemoglobin 12 -Continue to monitor    DVT  prophylaxis: Lovenox  Code Status: Full Family Communication: No family at bedside Disposition Plan: Pending clinical improvement, home with home health   Consultants:   General surgery   Procedures:   S/P exploratory laparotomy,enterolysis, sb resection - 04/22/2018, Dr. Brantley Stage  Antimicrobials:  Anti-infectives (From admission, onward)   None       Objective: Vitals:   04/27/18 0442 04/27/18 1330 04/27/18 2254 04/28/18 0459  BP: (!) 144/72 124/68 123/67 125/71  Pulse: 72 76 74 69  Resp: 18 14 16 14   Temp: 98.2 F (36.8 C) (!) 97.3 F (36.3 C) 98.2 F (36.8 C) 98 F (36.7 C)  TempSrc: Oral Oral Oral Oral  SpO2: 100% 100% 96% 99%  Weight: 62.6 kg (138 lb 0.1 oz)     Height:        Intake/Output Summary (Last 24 hours) at 04/28/2018 1228 Last data filed at 04/28/2018 1045 Gross per 24 hour  Intake 1700.41 ml  Output -  Net 1700.41 ml   Filed Weights   04/24/18 0500 04/24/18 0915 04/27/18 0442  Weight: 65.7 kg (144 lb 13.5 oz) 65.7 kg (144 lb 13.5 oz) 62.6 kg (138 lb 0.1 oz)    Examination: General exam: Appears calm and comfortable  Respiratory system: Clear to auscultation. Respiratory effort normal. Cardiovascular system: S1 & S2 heard, RRR. No JVD, murmurs, rubs, gallops or clicks. No pedal edema. Gastrointestinal system: Abdomen is mildly distended, soft and TTP. No organomegaly or masses felt. Normal bowel sounds heard. Central nervous system: Alert and oriented. No focal neurological deficits. Extremities: +right knee edema due to recent total knee replacement  Skin: No rashes, lesions or ulcers Psychiatry: Judgement and insight appear normal. Mood & affect appropriate.  Data Reviewed: I have personally reviewed following labs and imaging studies  CBC: Recent Labs  Lab 04/24/18 0525 04/25/18 0408 04/26/18 0500 04/27/18 0827 04/28/18 0420  WBC 21.9* 13.4* 14.1* 11.3* 12.7*  NEUTROABS 18.7* 11.5*  --   --   --   HGB 12.6 10.3* 10.5* 9.9* 10.1*   HCT 40.0 31.4* 31.9* 30.9* 31.0*  MCV 105.3* 104.0* 100.3* 102.0* 102.3*  PLT 428* 309 322 317 789   Basic Metabolic Panel: Recent Labs  Lab 04/24/18 0525 04/25/18 0408 04/26/18 0500 04/27/18 0430 04/28/18 0420  NA 138  140 135 134* 138 136  K 3.9  3.8 4.0 3.6 4.5 4.0  CL 99*  101 99* 98 104 101  CO2 31  31 30 28 28 27   GLUCOSE 126*  134* 108* 114* 103* 100*  BUN 8  9 7  7* 7* 6*  CREATININE 0.80  0.80 0.62 0.62 0.56 0.57  CALCIUM 8.8*  8.9 8.2* 8.0* 8.5* 8.2*  MG 1.8 1.8 1.9 2.0 1.9  PHOS 3.4 2.8 3.5 3.8 4.9*   GFR: Estimated Creatinine Clearance: 65.6 mL/min (by C-G formula based on SCr of 0.57 mg/dL). Liver Function Tests: Recent Labs  Lab 04/24/18 0525 04/25/18 0408 04/28/18 0420  AST 29 23 18   ALT 21 17 18   ALKPHOS 67 53 74  BILITOT 0.8 0.6 0.4  PROT 6.0* 5.1* 5.5*  ALBUMIN 3.1* 2.5* 2.7*   No results for input(s): LIPASE, AMYLASE in the last 168 hours. No results for input(s): AMMONIA in the last 168 hours. Coagulation Profile: No results for input(s): INR, PROTIME in the last 168 hours. Cardiac Enzymes: No results for input(s): CKTOTAL, CKMB, CKMBINDEX, TROPONINI in the last 168 hours. BNP (last 3 results) No results for input(s): PROBNP in the last 8760 hours. HbA1C: No results for input(s): HGBA1C in the last 72 hours. CBG: Recent Labs  Lab 04/26/18 2345 04/27/18 0440 04/27/18 0740 04/27/18 1244 04/27/18 1601  GLUCAP 109* 97 94 88 96   Lipid Profile: No results for input(s): CHOL, HDL, LDLCALC, TRIG, CHOLHDL, LDLDIRECT in the last 72 hours. Thyroid Function Tests: No results for input(s): TSH, T4TOTAL, FREET4, T3FREE, THYROIDAB in the last 72 hours. Anemia Panel: No results for input(s): VITAMINB12, FOLATE, FERRITIN, TIBC, IRON, RETICCTPCT in the last 72 hours. Sepsis Labs: No results for input(s): PROCALCITON, LATICACIDVEN in the last 168 hours.  No results found for this or any previous visit (from the past 240 hour(s)).      Radiology Studies: No results found.    Scheduled Meds: . acetaminophen  650 mg Oral Q6H  . docusate sodium  100 mg Oral Daily  . enoxaparin (LOVENOX) injection  40 mg Subcutaneous Q24H  . feeding supplement  1 Container Oral BID BM  . mouth rinse  15 mL Mouth Rinse BID  . metoCLOPramide (REGLAN) injection  10 mg Intravenous Q6H  . polyethylene glycol  17 g Oral Daily  . sodium chloride flush  10-40 mL Intracatheter Q12H  . traMADol  50 mg Oral Q12H   Continuous Infusions: . TPN ADULT (ION) 30 mL/hr at 04/28/18 0000     LOS: 11 days    Time spent: 25 minutes   Dessa Phi, DO [facility] [email or web address] Password [facility] 04/28/2018, 12:28 PM

## 2018-04-28 NOTE — Progress Notes (Signed)
[facility] NOTE   Pharmacy Consult for TPN Indication: prolonged ileus  Patient Measurements: Height: 5\' 5"  (165.1 cm) Weight: 138 lb 0.1 oz (62.6 kg) IBW/kg (Calculated) : 57 TPN AdjBW (KG): 54.9 Body mass index is 22.97 kg/m. Usual Weight: 55 kg  Assessment:  62 y/o female who had right TKR on 04/12/18 who presented to the ED on 04/17/18 with constipation, abdominal pain, and vomiting found to have a pSBO likely from chronic constipation in setting of opiate use vs adhesions. NGT placed and bowel rest initiated but she failed medical management and is s/p ex lap, LOA, and SBR on 04/22/18. She has had minimal nutrition over the last 9-10 days. Pharmacy consulted to begin TPN for ileus.  GI: post-op ileus, no nausea, passing gas and distention improved (surgery had TPN reduced by 1/2 with plans to d/c today). Reglan and miralax+colace added. NGT pulled 6/24. LBM 6/19 Endo: no hx DM- CBGs low (SSI d/c 6/25) Insulin requirements in the past 24 hours: n/a Lytes: Cl low 101 improving despite rate decrease, Na low normal 136 Renal: SCr wnl, had good UOP, no longer recorded. Fluids stopped 6/26 Pulm: RA Cards: VSS Hepatobil: LFT wnl, TG 156>106 Neuro: Still having high pain scores, APAP QID, tramadol BID,  PRN dilaudid + fentanyl ID: [id number], afeb  TPN Access: double lumen PICC 6/23 TPN start date: 6/23 >> Nutritional Goals (per RD recommendation on 6/24): KCal: 1750-1950 Protein: 85-100 Fluid: 1.7-1.9L  Current Nutrition:  TPN Clears> Full liquid (tolerating) Boost Breeze BID (ea provides 250 kcal, 9g protein) - refused one d/t confusion over clear diet  Plan:  D/c TPN today per surgery, will continue at 30 mL/hr with current bag and d/c at 1800 given low rate and low risk for hypoglycemia Pharmacy will sign off  Bertis Ruddy, PharmD Pharmacy Resident [phone] 04/28/2018 8:02 AM

## 2018-04-29 DIAGNOSIS — K56609 Unspecified intestinal obstruction, unspecified as to partial versus complete obstruction: Secondary | ICD-10-CM

## 2018-04-29 LAB — CBC
HCT: 30.4 % — ABNORMAL LOW (ref 36.0–46.0)
HEMOGLOBIN: 9.8 g/dL — AB (ref 12.0–15.0)
MCH: 33.1 pg (ref 26.0–34.0)
MCHC: 32.2 g/dL (ref 30.0–36.0)
MCV: 102.7 fL — ABNORMAL HIGH (ref 78.0–100.0)
PLATELETS: 319 10*3/uL (ref 150–400)
RBC: 2.96 MIL/uL — ABNORMAL LOW (ref 3.87–5.11)
RDW: 12.8 % (ref 11.5–15.5)
WBC: 11.8 10*3/uL — AB (ref 4.0–10.5)

## 2018-04-29 LAB — URINALYSIS, ROUTINE W REFLEX MICROSCOPIC
Bilirubin Urine: NEGATIVE
Glucose, UA: NEGATIVE mg/dL
Hgb urine dipstick: NEGATIVE
Ketones, ur: NEGATIVE mg/dL
LEUKOCYTES UA: NEGATIVE
Nitrite: NEGATIVE
PROTEIN: NEGATIVE mg/dL
Specific Gravity, Urine: 1.012 (ref 1.005–1.030)
pH: 7 (ref 5.0–8.0)

## 2018-04-29 LAB — BASIC METABOLIC PANEL
ANION GAP: 9 (ref 5–15)
BUN: 6 mg/dL — ABNORMAL LOW (ref 8–23)
CO2: 30 mmol/L (ref 22–32)
Calcium: 8.8 mg/dL — ABNORMAL LOW (ref 8.9–10.3)
Chloride: 98 mmol/L (ref 98–111)
Creatinine, Ser: 0.75 mg/dL (ref 0.44–1.00)
GFR calc Af Amer: >60 mL/min (ref >60)
GLUCOSE: 87 mg/dL (ref 70–99)
POTASSIUM: 3.8 mmol/L (ref 3.5–5.1)
SODIUM: 137 mmol/L (ref 135–145)

## 2018-04-29 MED ORDER — METHOCARBAMOL 500 MG PO TABS
500.0000 mg | ORAL_TABLET | Freq: Three times a day (TID) | ORAL | Status: DC
  Administered 2018-04-29 – 2018-05-01 (×7): 500 mg via ORAL
  Filled 2018-04-29 (×7): qty 1

## 2018-04-29 NOTE — Progress Notes (Signed)
[facility] Progress Note  7 Days Post-Op  Subjective: CC: abdominal pain Patient still having some abdominal pain, worse with passing flatus. Tolerating diet, denies nausea. No BM.   Objective: Vital signs in last 24 hours: Temp:  [98.1 F (36.7 C)-98.2 F (36.8 C)] 98.2 F (36.8 C) (06/28 0619) Pulse Rate:  [74-83] 78 (06/28 0619) Resp:  [17-18] 17 (06/28 0619) BP: (129-139)/(66-70) 129/66 (06/28 0619) SpO2:  [97 %-99 %] 97 % (06/28 0619) Last BM Date: 04/25/18(Per PT)  Intake/Output from previous day: 06/27 0701 - 06/28 0700 In: 1340 [P.O.:1340] Out: -  Intake/Output this shift: No intake/output data recorded.  PE: Gen:  Alert, NAD, pleasant Card:  Regular rate and rhythm, pedal pulses 2+ BL Pulm:  Normal effort, clear to auscultation bilaterally Abd: Soft, appropriately tender, mildly distended, bowel sounds present, no HSM; incision with scant purulent drainage from open portion of wound, no tracts extending horizontally from wound Skin: warm and dry, no rashes  Psych: A&Ox3     Lab Results:  Recent Labs    04/28/18 0420 04/29/18 0400  WBC 12.7* 11.8*  HGB 10.1* 9.8*  HCT 31.0* 30.4*  PLT 306 319   BMET Recent Labs    04/28/18 0420 04/29/18 0400  NA 136 137  K 4.0 3.8  CL 101 98  CO2 27 30  GLUCOSE 100* 87  BUN 6* 6*  CREATININE 0.57 0.75  CALCIUM 8.2* 8.8*   PT/INR No results for input(s): LABPROT, INR in the last 72 hours. CMP     Component Value Date/Time   NA 137 04/29/2018 0400   K 3.8 04/29/2018 0400   CL 98 04/29/2018 0400   CO2 30 04/29/2018 0400   GLUCOSE 87 04/29/2018 0400   BUN 6 (L) 04/29/2018 0400   CREATININE 0.75 04/29/2018 0400   CREATININE 1.05 (H) 03/26/2017 0929   CALCIUM 8.8 (L) 04/29/2018 0400   PROT 5.5 (L) 04/28/2018 0420   ALBUMIN 2.7 (L) 04/28/2018 0420   AST 18 04/28/2018 0420   ALT 18 04/28/2018 0420   ALKPHOS 74 04/28/2018 0420   BILITOT 0.4 04/28/2018 0420   GFRNONAA >60 04/29/2018 0400   GFRAA >60 04/29/2018 0400   Lipase     Component Value Date/Time   LIPASE 26 04/17/2018 0353       Studies/Results: No results found.  Anti-infectives: Anti-infectives (From admission, onward)   None       Assessment/Plan Raynaud's disease Recurrent herpes labialis Mood disorder (HCC) Tubular adenoma of colon Primary osteoarthritis of right knee IBS (irritable bowel syndrome)  S/P right knee replacement - 04/12/2018 - [facility]  SBO - S/P exploratory laparotomy,enterolysis, sb resection - 04/22/2018, Dr. Brantley Stage - noN/V, having flatus Wound infection  - staples removed from mid portion of wound and saline moistened 2x2 packed into open wound - dressing changes BID - WBC 11.8, afebrile  MOL:MBEM,L/J TPN,soft diet VTE: SCD's, lovenox ID:no current abx Foley:none Follow up:Dr. Cornett  DISPO:encourage ambulation and IS.Continue bowel regimen, try dulcolax suppository additionally today. Dressing changes for wound infection.     LOS: 12 days    Brigid Re , [facility] 04/29/2018, 1:01 PM Pager: [phone] Consults: [phone] Mon-Fri 7:00 am-4:30 pm Sat-Sun 7:00 am-11:30 am

## 2018-04-29 NOTE — Progress Notes (Signed)
PROGRESS NOTE  Bethany [name] MEQ:[id number] DOB: 10/30/1956 DOA: 04/17/2018 PCP: Denita Lung, MD   LOS: 12 days   Brief Narrative / Interim history: Bethany [name] is a 62 year old female with past medical history significant for osteoarthritis of the right knee status post right total knee replacement on 04/12/2018 who presented with worsening generalized abdominal pain. CT abdomen pelvis with contrast done on 04/17/2018 revealed moderate to high-grade partial small bowel obstruction. Prior hysterectomy noted on imaging. Recently on opiates however stated that she was taking laxative and stool softener along with her pain medications. History of tubular adenoma of colon one year ago. Admitted for partial small bowel obstruction. She underwent ex lap with lysis of adhesions and status post resection of small bowel, postoperative course complicated by ileus.  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 -A appears to be having postoperative ileus, passing gas (however does complain of pain with his) but did not have a bowel movement just yet -Continue soft diet -General surgery following, continue promotility agents with Reglan, continue laxatives  Chronic OA s/p right total knee replacement -Continue PT OT, recommending home health PT  -Orthopedic surgery following  Hyperlipidemia -Continue to hold statin until oral intake stable   Chronic macrocytic anemia -Baseline hemoglobin 12 -Continue to monitor, hemoglobin overall stable at 9.8 this morning   DVT  prophylaxis: Lovenox Code Status: Full code Family Communication: Patient's father present at bedside Disposition Plan: Home when cleared by general surgery  Consultants:   General surgery  Procedures:   Expiratory laparotomy, enterolysis, small bowel resection on 6/21 by Dr. Brantley Stage  Antimicrobials:  None    Subjective: -Doing well, however still complains of abdominal pain, no chest pain, no shortness of breath.  No nausea or vomiting.  Has not had a bowel movement just yet.  Objective: Vitals:   04/28/18 0459 04/28/18 1323 04/28/18 2239 04/29/18 0619  BP: 125/71 139/69 131/70 129/66  Pulse: 69 83 74 78  Resp: 14 18 17 17   Temp: 98 F (36.7 C) 98.1 F (36.7 C) 98.2 F (36.8 C) 98.2 F (36.8 C)  TempSrc: Oral Oral Oral Oral  SpO2: 99% 99% 97% 97%  Weight:      Height:        Intake/Output Summary (Last 24 hours) at 04/29/2018 1408 Last data filed at 04/29/2018 1343 Gross per 24 hour  Intake 360 ml  Output 400 ml  Net -40 ml   Filed Weights   04/24/18 0500 04/24/18 0915 04/27/18 0442  Weight: 65.7 kg (144 lb 13.5 oz) 65.7 kg (144 lb 13.5 oz) 62.6 kg (138 lb 0.1 oz)    Examination:  Constitutional: NAD Respiratory: CTA Cardiovascular: RRR Abdomen: Dressing in place, clean   Data Reviewed: I have independently reviewed following labs and imaging studies   CBC: Recent Labs  Lab 04/24/18 0525 04/25/18 0408 04/26/18 0500 04/27/18 0827 04/28/18 0420 04/29/18 0400  WBC 21.9* 13.4* 14.1* 11.3* 12.7* 11.8*  NEUTROABS 18.7* 11.5*  --   --   --   --   HGB 12.6 10.3* 10.5* 9.9* 10.1* 9.8*  HCT 40.0 31.4*  31.9* 30.9* 31.0* 30.4*  MCV 105.3* 104.0* 100.3* 102.0* 102.3* 102.7*  PLT 428* 309 322 317 306 619   Basic Metabolic Panel: Recent Labs  Lab 04/24/18 0525 04/25/18 0408 04/26/18 0500 04/27/18 0430 04/28/18 0420 04/29/18 0400  NA 138  140 135 134* 138 136 137  K 3.9  3.8 4.0 3.6 4.5 4.0 3.8  CL 99*  101 99* 98 104 101 98  CO2 31  31 30 28  28 27 30   GLUCOSE 126*  134* 108* 114* 103* 100* 87  BUN 8  9 7  7* 7* 6* 6*  CREATININE 0.80  0.80 0.62 0.62 0.56 0.57 0.75  CALCIUM 8.8*  8.9 8.2* 8.0* 8.5* 8.2* 8.8*  MG 1.8 1.8 1.9 2.0 1.9  --   PHOS 3.4 2.8 3.5 3.8 4.9*  --    GFR: Estimated Creatinine Clearance: 65.6 mL/min (by C-G formula based on SCr of 0.75 mg/dL). Liver Function Tests: Recent Labs  Lab 04/24/18 0525 04/25/18 0408 04/28/18 0420  AST 29 23 18   ALT 21 17 18   ALKPHOS 67 53 74  BILITOT 0.8 0.6 0.4  PROT 6.0* 5.1* 5.5*  ALBUMIN 3.1* 2.5* 2.7*   No results for input(s): LIPASE, AMYLASE in the last 168 hours. No results for input(s): AMMONIA in the last 168 hours. Coagulation Profile: No results for input(s): INR, PROTIME in the last 168 hours. Cardiac Enzymes: No results for input(s): CKTOTAL, CKMB, CKMBINDEX, TROPONINI in the last 168 hours. BNP (last 3 results) No results for input(s): PROBNP in the last 8760 hours. HbA1C: No results for input(s): HGBA1C in the last 72 hours. CBG: Recent Labs  Lab 04/26/18 2345 04/27/18 0440 04/27/18 0740 04/27/18 1244 04/27/18 1601  GLUCAP 109* 97 94 88 96   Lipid Profile: No results for input(s): CHOL, HDL, LDLCALC, TRIG, CHOLHDL, LDLDIRECT in the last 72 hours. Thyroid Function Tests: No results for input(s): TSH, T4TOTAL, FREET4, T3FREE, THYROIDAB in the last 72 hours. Anemia Panel: No results for input(s): VITAMINB12, FOLATE, FERRITIN, TIBC, IRON, RETICCTPCT in the last 72 hours. Urine analysis:    Component Value Date/Time   COLORURINE YELLOW 04/29/2018 [place] 04/29/2018 1112   LABSPEC 1.012 04/29/2018 1112   LABSPEC 1.030 08/04/2017 0920   PHURINE 7.0 04/29/2018 1112   GLUCOSEU NEGATIVE 04/29/2018 1112   HGBUR NEGATIVE 04/29/2018 1112   BILIRUBINUR NEGATIVE 04/29/2018 1112   BILIRUBINUR negative 08/04/2017 0920   BILIRUBINUR n 09/16/2016 1559   KETONESUR NEGATIVE 04/29/2018 1112   PROTEINUR NEGATIVE 04/29/2018 1112    UROBILINOGEN 0.2 09/16/2016 1559   UROBILINOGEN 2 (H) 05/23/2015 1252   NITRITE NEGATIVE 04/29/2018 1112   LEUKOCYTESUR NEGATIVE 04/29/2018 1112   Sepsis Labs: Invalid input(s): PROCALCITONIN, LACTICIDVEN  No results found for this or any previous visit (from the past 240 hour(s)).    Radiology Studies: No results found.   Scheduled Meds: . acetaminophen  650 mg Oral Q6H  . docusate sodium  100 mg Oral Daily  . enoxaparin (LOVENOX) injection  40 mg Subcutaneous Q24H  . mouth rinse  15 mL Mouth Rinse BID  . methocarbamol  500 mg Oral TID  . metoCLOPramide (REGLAN) injection  10 mg Intravenous Q6H  . multivitamin with minerals  1 tablet Oral Daily  . polyethylene glycol  17 g Oral Daily  . sodium chloride flush  10-40 mL Intracatheter Q12H  . traMADol  50 mg Oral Q12H   Continuous Infusions:  Marzetta Board, MD, PhD [facility] Pager [phone]  If 7PM-7AM, please contact night-coverage [email or web address] Password [facility] 04/29/2018, 2:08 PM

## 2018-04-30 LAB — URINE CULTURE

## 2018-04-30 LAB — CBC
HCT: 31.5 % — ABNORMAL LOW (ref 36.0–46.0)
HEMOGLOBIN: 10.1 g/dL — AB (ref 12.0–15.0)
MCH: 33.3 pg (ref 26.0–34.0)
MCHC: 32.1 g/dL (ref 30.0–36.0)
MCV: 104 fL — AB (ref 78.0–100.0)
Platelets: 334 10*3/uL (ref 150–400)
RBC: 3.03 MIL/uL — ABNORMAL LOW (ref 3.87–5.11)
RDW: 12.8 % (ref 11.5–15.5)
WBC: 11.3 10*3/uL — ABNORMAL HIGH (ref 4.0–10.5)

## 2018-04-30 MED ORDER — TRAMADOL HCL 50 MG PO TABS: 50.0000 mg | ORAL_TABLET | Freq: Four times a day (QID) | ORAL | Status: DC | PRN

## 2018-04-30 MED ORDER — HYDROCODONE-ACETAMINOPHEN 5-325 MG PO TABS
1.0000 | ORAL_TABLET | ORAL | Status: DC | PRN
  Administered 2018-04-30 – 2018-05-01 (×6): 2 via ORAL
  Filled 2018-04-30 (×6): qty 2

## 2018-04-30 MED ORDER — KETOROLAC TROMETHAMINE 30 MG/ML IJ SOLN
30.0000 mg | Freq: Four times a day (QID) | INTRAMUSCULAR | Status: DC | PRN
  Administered 2018-04-30 (×2): 30 mg via INTRAVENOUS
  Filled 2018-04-30 (×2): qty 1

## 2018-04-30 MED ORDER — PANTOPRAZOLE SODIUM 40 MG PO TBEC
40.0000 mg | DELAYED_RELEASE_TABLET | Freq: Every day | ORAL | Status: DC
Start: 2018-04-30 — End: 2018-05-01
  Administered 2018-04-30 – 2018-05-01 (×2): 40 mg via ORAL
  Filled 2018-04-30 (×2): qty 1

## 2018-04-30 NOTE — Progress Notes (Signed)
PROGRESS NOTE  Bethany [name] LXB:[id number] DOB: 10/10/1956 DOA: 04/17/2018 PCP: Denita Lung, MD   LOS: 13 days   Brief Narrative / Interim history: Bethany [name] is a 62 year old female with past medical history significant for osteoarthritis of the right knee status post right total knee replacement on 04/12/2018 who presented with worsening generalized abdominal pain. CT abdomen pelvis with contrast done on 04/17/2018 revealed moderate to high-grade partial small bowel obstruction. Prior hysterectomy noted on imaging. Recently on opiates however stated that she was taking laxative and stool softener along with her pain medications. History of tubular adenoma of colon one year ago. Admitted for partial small bowel obstruction. She underwent ex lap with lysis of adhesions and status post resection of small bowel, postoperative course complicated by ileus.  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  Patient is ambulatory  Hyperlipidemia -Continue to hold statin until oral intake stable   Chronic macrocytic anemia -Baseline hemoglobin 12 -Hemoglobin stable   DVT prophylaxis: Lovenox Code Status: Full code Family Communication: No family at bedside Disposition  Plan: Home when cleared by general surgery  Consultants:   General surgery  Procedures:   Expiratory laparotomy, enterolysis, small bowel resection on 6/21 by Dr. Brantley Stage  Antimicrobials:  None    Subjective: -Had a small BM last night, mild abdominal pain present, no nausea or vomiting  Objective: Vitals:   04/29/18 0619 04/29/18 1439 04/29/18 2011 04/30/18 0359  BP: 129/66 114/62 102/65 123/72  Pulse: 78 88 90 85  Resp: 17  16 16   Temp: 98.2 F (36.8 C) 98.2 F (36.8 C) 98 F (36.7 C) 98.1 F (36.7 C)  TempSrc: Oral Oral Oral Oral  SpO2: 97% 100% 98% 96%  Weight:      Height:        Intake/Output Summary (Last 24 hours) at 04/30/2018 1124 Last data filed at 04/30/2018 0359 Gross per 24 hour  Intake 400 ml  Output 400 ml  Net 0 ml   Filed Weights   04/24/18 0500 04/24/18 0915 04/27/18 0442  Weight: 65.7 kg (144 lb 13.5 oz) 65.7 kg (144 lb 13.5 oz) 62.6 kg (138 lb 0.1 oz)    Examination:  Constitutional: NAD Respiratory: CTA Cardiovascular: RRR   Data Reviewed: I have independently reviewed following labs and imaging studies   CBC: Recent Labs  Lab 04/24/18 0525 04/25/18 0408 04/26/18 0500 04/27/18 0827 04/28/18 0420 04/29/18 0400 04/30/18 0242  WBC 21.9* 13.4* 14.1* 11.3* 12.7* 11.8* 11.3*  NEUTROABS 18.7* 11.5*  --   --   --   --   --   HGB 12.6 10.3* 10.5* 9.9* 10.1* 9.8* 10.1*  HCT 40.0 31.4* 31.9* 30.9* 31.0* 30.4* 31.5*  MCV 105.3* 104.0* 100.3* 102.0* 102.3* 102.7* 104.0*  PLT 428* 309 322 317 306 319 334   Basic  Metabolic Panel: Recent Labs  Lab 04/24/18 0525 04/25/18 0408 04/26/18 0500 04/27/18 0430 04/28/18 0420 04/29/18 0400  NA 138  140 135 134* 138 136 137  K 3.9  3.8 4.0 3.6 4.5 4.0 3.8  CL 99*  101 99* 98 104 101 98  CO2 31  31 30 28 28 27 30   GLUCOSE 126*  134* 108* 114* 103* 100* 87  BUN 8  9 7  7* 7* 6* 6*  CREATININE 0.80  0.80 0.62 0.62 0.56 0.57 0.75  CALCIUM 8.8*  8.9 8.2* 8.0* 8.5* 8.2* 8.8*  MG 1.8  1.8 1.9 2.0 1.9  --   PHOS 3.4 2.8 3.5 3.8 4.9*  --    GFR: Estimated Creatinine Clearance: 65.6 mL/min (by C-G formula based on SCr of 0.75 mg/dL). Liver Function Tests: Recent Labs  Lab 04/24/18 0525 04/25/18 0408 04/28/18 0420  AST 29 23 18   ALT 21 17 18   ALKPHOS 67 53 74  BILITOT 0.8 0.6 0.4  PROT 6.0* 5.1* 5.5*  ALBUMIN 3.1* 2.5* 2.7*   No results for input(s): LIPASE, AMYLASE in the last 168 hours. No results for input(s): AMMONIA in the last 168 hours. Coagulation Profile: No results for input(s): INR, PROTIME in the last 168 hours. Cardiac Enzymes: No results for input(s): CKTOTAL, CKMB, CKMBINDEX, TROPONINI in the last 168 hours. BNP (last 3 results) No results for input(s): PROBNP in the last 8760 hours. HbA1C: No results for input(s): HGBA1C in the last 72 hours. CBG: Recent Labs  Lab 04/26/18 2345 04/27/18 0440 04/27/18 0740 04/27/18 1244 04/27/18 1601  GLUCAP 109* 97 94 88 96   Lipid Profile: No results for input(s): CHOL, HDL, LDLCALC, TRIG, CHOLHDL, LDLDIRECT in the last 72 hours. Thyroid Function Tests: No results for input(s): TSH, T4TOTAL, FREET4, T3FREE, THYROIDAB in the last 72 hours. Anemia Panel: No results for input(s): VITAMINB12, FOLATE, FERRITIN, TIBC, IRON, RETICCTPCT in the last 72 hours. Urine analysis:    Component Value Date/Time   COLORURINE YELLOW 04/29/2018 [place] 04/29/2018 1112   LABSPEC 1.012 04/29/2018 1112   LABSPEC 1.030 08/04/2017 0920   PHURINE 7.0 04/29/2018 1112   GLUCOSEU NEGATIVE 04/29/2018 1112   HGBUR NEGATIVE 04/29/2018 1112   BILIRUBINUR NEGATIVE 04/29/2018 1112   BILIRUBINUR negative 08/04/2017 0920   BILIRUBINUR n 09/16/2016 1559   KETONESUR NEGATIVE 04/29/2018 1112   PROTEINUR NEGATIVE 04/29/2018 1112   UROBILINOGEN 0.2 09/16/2016 1559   UROBILINOGEN 2 (H) 05/23/2015 1252   NITRITE NEGATIVE 04/29/2018 1112   LEUKOCYTESUR NEGATIVE 04/29/2018 1112   Sepsis Labs: Invalid input(s):  PROCALCITONIN, LACTICIDVEN  Recent Results (from the past 240 hour(s))  Culture, Urine     Status: Abnormal   Collection Time: 04/29/18 11:20 AM  Result Value Ref Range Status   Specimen Description URINE, CLEAN CATCH  Final   Special Requests   Final    NONE Performed at [facility], [address] [place], [place] [address]    Culture MULTIPLE SPECIES PRESENT, SUGGEST RECOLLECTION (A)  Final   Report Status 04/30/2018 FINAL  Final      Radiology Studies: No results found.   Scheduled Meds: . acetaminophen  650 mg Oral Q6H  . docusate sodium  100 mg Oral Daily  . enoxaparin (LOVENOX) injection  40 mg Subcutaneous Q24H  . mouth rinse  15 mL Mouth Rinse BID  . methocarbamol  500 mg Oral TID  . metoCLOPramide (REGLAN) injection  10 mg Intravenous Q6H  . multivitamin with minerals  1 tablet Oral Daily  . pantoprazole  40 mg Oral Daily  . polyethylene glycol  17 g Oral Daily  . sodium chloride flush  10-40 mL Intracatheter Q12H  . traMADol  50 mg Oral Q12H   Continuous Infusions:  Marzetta Board, MD, PhD [facility] Pager [phone]  If 7PM-7AM, please contact night-coverage [email or web address] Password [facility] 04/30/2018, 11:24 AM

## 2018-04-30 NOTE — Progress Notes (Addendum)
8 Days Post-Op  Subjective: Stable and alert. Still using Dilaudid and oxycodone per RN Has had 2 tiny bowel movements and a little bit of flatus.  No nausea or vomiting reports anorexia. Apparently goes outside to smoke at night Lab today reveals WBC 11,300.  Hemoglobin 10.1.  Bmet  yesterday looked fine.   Objective: Vital signs in last 24 hours: Temp:  [98 F (36.7 C)-98.2 F (36.8 C)] 98.1 F (36.7 C) (06/29 0359) Pulse Rate:  [85-90] 85 (06/29 0359) Resp:  [16] 16 (06/29 0359) BP: (102-123)/(62-72) 123/72 (06/29 0359) SpO2:  [96 %-100 %] 96 % (06/29 0359) Last BM Date: 04/29/18  Intake/Output from previous day: 06/28 0701 - 06/29 0700 In: 400 [P.O.:400] Out: 400 [Urine:400] Intake/Output this shift: No intake/output data recorded.   PE: Gen: Alert, NAD, pleasant, slightly flattened affect. Card: Regular rate and rhythm, pedal pulses 2+ BL Pulm: Normal effort, clear to auscultation bilaterally Abd: Soft,appropriatelytender,mnimal distended, bowel sounds present, no HSM;incision with scant purulent drainage from open portion of wound, no tracts extending horizontally from wound, no new areas of erythema or drainage.  Fairly reassuring exam. Skin: warm and dry, no rashes  Psych: A&Ox3      Lab Results:  Results for orders placed or performed during the hospital encounter of 04/17/18 (from the past 24 hour(s))  Urinalysis, Routine w reflex microscopic     Status: None   Collection Time: 04/29/18 11:12 AM  Result Value Ref Range   Color, Urine YELLOW YELLOW   APPearance CLEAR CLEAR   Specific Gravity, Urine 1.012 1.005 - 1.030   pH 7.0 5.0 - 8.0   Glucose, UA NEGATIVE NEGATIVE mg/dL   Hgb urine dipstick NEGATIVE NEGATIVE   Bilirubin Urine NEGATIVE NEGATIVE   Ketones, ur NEGATIVE NEGATIVE mg/dL   Protein, ur NEGATIVE NEGATIVE mg/dL   Nitrite NEGATIVE NEGATIVE   Leukocytes, UA NEGATIVE NEGATIVE  CBC     Status: Abnormal   Collection Time: 04/30/18  2:42  AM  Result Value Ref Range   WBC 11.3 (H) 4.0 - 10.5 K/uL   RBC 3.03 (L) 3.87 - 5.11 MIL/uL   Hemoglobin 10.1 (L) 12.0 - 15.0 g/dL   HCT 31.5 (L) 36.0 - 46.0 %   MCV 104.0 (H) 78.0 - 100.0 fL   MCH 33.3 26.0 - 34.0 pg   MCHC 32.1 30.0 - 36.0 g/dL   RDW 12.8 11.5 - 15.5 %   Platelets 334 150 - 400 K/uL     Studies/Results: No results found.  Marland Kitchen acetaminophen  650 mg Oral Q6H  . docusate sodium  100 mg Oral Daily  . enoxaparin (LOVENOX) injection  40 mg Subcutaneous Q24H  . mouth rinse  15 mL Mouth Rinse BID  . methocarbamol  500 mg Oral TID  . metoCLOPramide (REGLAN) injection  10 mg Intravenous Q6H  . multivitamin with minerals  1 tablet Oral Daily  . pantoprazole  40 mg Oral Daily  . polyethylene glycol  17 g Oral Daily  . sodium chloride flush  10-40 mL Intracatheter Q12H  . traMADol  50 mg Oral Q12H     Assessment/Plan: s/p Procedure(s): EXPLORATORY LAPAROTOMY  Raynaud's disease Recurrent herpes labialis Mood disorder (HCC) Tubular adenoma of colon Primary osteoarthritis of right knee IBS (irritable bowel syndrome)  S/P right knee replacement - 04/12/2018 - [facility]  POD#9 - S/P exploratory laparotomy,enterolysis, sb resection - 04/22/2018, Dr. Brantley Stage - noN/V, having flatus -Clinical exam reassuring.  GI function slow to normalize, possibly because of narcotic. -I  have discontinued oxycodone and Dilaudid and substituted tramadol and Norco and Toradol. -Once bowel function normalizes she will meet all discharge criteria Wound infection - staples removed from mid portion of wound (6/28) and saline moistened 2x2 packed into open wound - dressing changes BID - WBC 11.3, afebrile.  Clinically doubt uncontrolled source of infection  AST:MHDQ,Q/IWLN,LGXQ diet VTE: SCD's, lovenox JJ:HERDEYCXK abx Foley:none Follow up:Dr. Cornett  DISPO:encourage ambulation and IS.Continue bowel regimen. Dressing changes for wound infection. Home  soon    @PROBHOSP @  LOS: 13 days    Adin Hector 04/30/2018  . .prob

## 2018-05-01 DIAGNOSIS — Z0189 Encounter for other specified special examinations: Secondary | ICD-10-CM

## 2018-05-01 DIAGNOSIS — K589 Irritable bowel syndrome without diarrhea: Secondary | ICD-10-CM

## 2018-05-01 MED ORDER — OXYCODONE-ACETAMINOPHEN 5-325 MG PO TABS: 1.0000 | ORAL_TABLET | Freq: Four times a day (QID) | ORAL | 0 refills | Status: DC | PRN

## 2018-05-01 NOTE — Discharge Instructions (Signed)
[facility], [place] [phone]  OPEN ABDOMINAL SURGERY: POST OP INSTRUCTIONS  Always review your discharge instruction sheet given to you by the facility where your surgery was performed.  IF YOU HAVE DISABILITY OR FAMILY LEAVE FORMS, YOU MUST BRING THEM TO THE OFFICE FOR PROCESSING.  PLEASE DO NOT GIVE THEM TO YOUR DOCTOR.  1. A prescription for pain medication may be given to you upon discharge.  Take your pain medication as prescribed, if needed.  If narcotic pain medicine is not needed, then you may take acetaminophen (Tylenol) or ibuprofen (Advil) as needed. 2. Take your usually prescribed medications unless otherwise directed. 3. If you need a refill on your pain medication, please contact your pharmacy. They will contact our office to request authorization.  Prescriptions will not be filled after 5pm or on week-ends. 4. You should follow a light diet the first few days after arrival home, such as soup and crackers, pudding, etc.unless your doctor has advised otherwise. A high-fiber, low fat diet can be resumed as tolerated.   Be sure to include lots of fluids daily. Most patients will experience some swelling and bruising on the chest and neck area.  Ice packs will help.  Swelling and bruising can take several days to resolve 5. Most patients will experience some swelling and bruising in the area of the incision. Ice pack will help. Swelling and bruising can take several days to resolve..  6. It is common to experience some constipation if taking pain medication after surgery.  Increasing fluid intake and taking a stool softener will usually help or prevent this problem from occurring.  A mild laxative (Milk of Magnesia or Miralax) should be taken according to package directions if there are no bowel movements after 48 hours. 7.  You may have steri-strips (small skin tapes) in place directly over the incision.  These strips should be left on the skin for 7-10 days.  If your  surgeon used skin glue on the incision, you may shower in 24 hours.  The glue will flake off over the next 2-3 weeks.  Any sutures or staples will be removed at the office during your follow-up visit. You may find that a light gauze bandage over your incision may keep your staples from being rubbed or pulled. You may shower and replace the bandage daily. 8. ACTIVITIES:  You may resume regular (light) daily activities beginning the next day--such as daily self-care, walking, climbing stairs--gradually increasing activities as tolerated.  You may have sexual intercourse when it is comfortable.  Refrain from any heavy lifting or straining until approved by your doctor. a. You may drive when you no longer are taking prescription pain medication, you can comfortably wear a seatbelt, and you can safely maneuver your car and apply brakes b. Return to Work: ___________________________________ 14. You should see your doctor in the office for a follow-up appointment approximately two weeks after your surgery.  Make sure that you call for this appointment within a day or two after you arrive home to insure a convenient appointment time. OTHER INSTRUCTIONS:  _____________________________________________________________ _____________________________________________________________  WHEN TO CALL YOUR DOCTOR: 1. Fever over 101.0 2. Inability to urinate 3. Nausea and/or vomiting 4. Extreme swelling or bruising 5. Continued bleeding from incision. 6. Increased pain, redness, or drainage from the incision. 7. Difficulty swallowing or breathing 8. Muscle cramping or spasms. 9. Numbness or tingling in hands or feet or around lips.    Wound care: Okay to  shower Saline and fine-mesh gauze packing to open area of wound twice a day             Staples will be removed in the office when you see Dr. Brantley Stage on July 5  The clinic staff is available to answer your questions during regular business hours.  Please dont  hesitate to call and ask to speak to one of the nurses if you have concerns.  For further questions, please visit [email or web address]

## 2018-05-01 NOTE — Progress Notes (Addendum)
9 Days Post-Op  Subjective: States she is feeling better and wants to go home.  Had another stool and passing flatus.  Tolerating solid diet.  Less anorexic.  Ambulating in halls.  Objective: Vital signs in last 24 hours: Temp:  [97.7 F (36.5 C)-98.1 F (36.7 C)] 97.7 F (36.5 C) (06/30 0526) Pulse Rate:  [76-77] 77 (06/30 0526) Resp:  [18] 18 (06/30 0526) BP: (106-130)/(61-77) 130/77 (06/30 0526) SpO2:  [99 %] 99 % (06/30 0526) Weight:  [61.3 kg (135 lb 2.3 oz)] 61.3 kg (135 lb 2.3 oz) (06/30 0635) Last BM Date: 04/30/18  Intake/Output from previous day: 06/29 0701 - 06/30 0700 In: 380 [P.O.:360; I.V.:20] Out: -  Intake/Output this shift: No intake/output data recorded.    PE: Gen: Alert, NAD, pleasant,  cooperative. Card: Regular rate and rhythm, pedal pulses 2+ BL Pulm: Normal effort, clear to auscultation bilaterally Abd: Soft,minimallytender, bowel sounds present, no HSM;incision with scant purulent drainagefrom open portion of wound, no tracts extending horizontally from wound, no new areas of erythema or drainage.  Fairly reassuring exam. Skin: warm and dry, no rashes  Psych: A&Ox3     Lab Results:  No results found for this or any previous visit (from the past 24 hour(s)).   Studies/Results: No results found.  Marland Kitchen acetaminophen  650 mg Oral Q6H  . docusate sodium  100 mg Oral Daily  . enoxaparin (LOVENOX) injection  40 mg Subcutaneous Q24H  . mouth rinse  15 mL Mouth Rinse BID  . methocarbamol  500 mg Oral TID  . metoCLOPramide (REGLAN) injection  10 mg Intravenous Q6H  . multivitamin with minerals  1 tablet Oral Daily  . pantoprazole  40 mg Oral Daily  . polyethylene glycol  17 g Oral Daily  . sodium chloride flush  10-40 mL Intracatheter Q12H  . traMADol  50 mg Oral Q12H     Assessment/Plan: s/p Procedure(s): EXPLORATORY LAPAROTOMY  Raynaud's disease Recurrent herpes labialis Mood disorder (HCC) Tubular adenoma of  colon Primary osteoarthritis of right knee IBS (irritable bowel syndrome)  S/P right knee replacement - 04/12/2018 - [facility]  POD#9 - S/P exploratory laparotomy,enterolysis, sb resection - 04/22/2018, Dr. Brantley Stage - noN/V, having flatus -Clinical exam reassuring. -Discharge home today -Discussed with Dr. Cruzita Lederer -Wound care will be saline and fine-mesh gauze wet-to-dry twice a day, okay to shower, staples out in office on July 5 when she sees Dr. Brantley Stage  Wound infection - staples removed from mid portion of wound (6/28) and saline moistened 2x2 packed into open wound.  Very localized area. - dressing changes BID - WBC 11.3, afebrile.  Clinically doubt uncontrolled source of infection  GBE:EFEO,F/HQRF,XJOI diet VTE: SCD's, lovenox TG:PQDIYMEBR abx Foley:none Follow up:Dr. Cornett  DISPO:     Home today Follow-up Dr. Brantley Stage July 5.  Appointment has been made and listed in discharge orders Minimize narcotics No sports or heavy lifting for 6 weeks Return to work in 4 weeks, sooner if desired     @PROBHOSP @  LOS: 14 days    Adin Hector 05/01/2018  . .prob

## 2018-05-01 NOTE — Discharge Summary (Signed)
Physician Discharge Summary  Bethany [name] ZOX:[id number] DOB: 1956-02-06 DOA: 04/17/2018  PCP: Denita Lung, MD  Admit date: 04/17/2018 Discharge date: 05/01/2018  Admitted From: home Disposition:  home  Recommendations for Outpatient Follow-up:  1. Follow up with Dr. Brantley Stage in 5 days as scheduled   Home Health: none Equipment/Devices: none  Discharge Condition: stable CODE STATUS: Full code Diet recommendation: regular  HPI: Per Dr. Evangeline Gula, 62 year old female past medical history significant for osteoporosis with the right knee status post right total knee replacement on 04/12/2018 just discharged from our facility presents with worsening generalized abdominal pain.  She is found to have a partial small bowel obstruction likely related to inactivity and pain medications.  Patient will be admitted into the hospital we will provide IV fluids, limit IV narcotics if possible, repeat x-ray in the morning, NG tube and other plans per surgery.  Hospital Course: Small bowel obstruction -patient was admitted to the hospital with a small bowel obstruction and general surgery was consulted on admission.  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  Her hospital course was complicated by postop ileus which was slow to resolve but eventually improved, she is able to tolerate a regular diet, is having bowel movements and will be discharged home in stable condition.  Discussed with general surgery on the day of discharge, she has follow-up as an outpatient in 5 days for staples removal. Chronic OA s/p right total knee replacement -Continue PT OT, recommending home health PT  Hyperlipidemia -resume home medications Chronic macrocytic anemia -Hemoglobin stable    Discharge Diagnoses:  Principal Problem:   SBO (small bowel  obstruction) ([place]) Active Problems:   Raynaud's disease   Recurrent herpes labialis   Mood disorder ([place])   Tubular adenoma of colon   Primary osteoarthritis of right knee   IBS (irritable bowel syndrome)     Discharge Instructions   Allergies as of 05/01/2018      Reactions   Morphine And Related Nausea Only      Medication List    TAKE these medications   aspirin 325 MG EC tablet Take 1 tablet (325 mg total) by mouth 2 (two) times daily after a meal.   b complex vitamins tablet Take 1 tablet by mouth daily.   bisacodyl 5 MG EC tablet Commonly known as:  DULCOLAX Take 1 tablet (5 mg total) by mouth daily as needed for moderate constipation.   cholecalciferol 1000 units tablet Commonly known as:  VITAMIN D Take 1,000 Units by mouth daily.   docusate sodium 100 MG capsule Commonly known as:  COLACE Take 1 capsule (100 mg total) by mouth 2 (two) times daily.   estradiol 1 MG tablet Commonly known as:  ESTRACE Take 1 tablet (1 mg total) by mouth daily.   lamoTRIgine 100 MG tablet Commonly known as:  LAMICTAL Take 100 mg by mouth 2 (two) times daily.   NEURONTIN 600 MG tablet Generic drug:  gabapentin Take 1,200 mg by mouth 2 (two) times daily.   oxyCODONE-acetaminophen 5-325 MG tablet Commonly known as:  PERCOCET Take 1-2 tablets by mouth every 6 (six) hours as needed for severe pain. What changed:  when to take this   PROCTOZONE-HC 2.5 % rectal cream Generic drug:  hydrocortisone apply rectally twice a day What changed:  See the new instructions.   promethazine 12.5 MG tablet Commonly  known as:  PHENERGAN Take 1-2 tablets (12.5-25 mg total) by mouth every 6 (six) hours as needed for nausea or vomiting.   simvastatin 20 MG tablet Commonly known as:  ZOCOR Take 1 tablet (20 mg total) by mouth daily.   THERATEARS 0.25 % Soln Generic drug:  Carboxymethylcellulose Sodium Place 1 drop into both eyes 2 (two) times daily as needed (dry eyes).     tiZANidine 4 MG tablet Commonly known as:  ZANAFLEX Take 1 tablet (4 mg total) by mouth every 6 (six) hours as needed.   vitamin C 500 MG tablet Commonly known as:  ASCORBIC ACID Take 500 mg by mouth daily.   Vitamin D (Ergocalciferol) 50000 units Caps capsule Commonly known as:  DRISDOL TAKE 1 CAPSULE BY MOUTH EVERY 7 DAYS      Follow-up Information    Home[facility] Follow up.   Specialty:  Home Health Services Why:  home health PT  Contact information: [place] [address] [phone]        Erroll Luna, MD. Go on 05/06/2018.   Specialty:  General Surgery Why:  Your follow up appointment for post-op check and staple removal is scheduled for 9:10 AM. Please arrive by 8:45 AM for check in. Bring photo ID and insurance information.  Contact information: [address] [place] [address] [phone]           Consultations:  General surgery   Procedures/Studies:  Expiratory laparotomy, enterolysis, small bowel resection on 6/21 by Dr. Brantley Stage   Dg Abd 1 View  Result Date: 04/21/2018 CLINICAL DATA:  Small bowel obstruction. EXAM: ABDOMEN - 1 VIEW COMPARISON:  Radiographs of April 20, 2018. FINDINGS: Stable small bowel dilatation is noted concerning for distal small bowel obstruction. No colonic dilatation is noted. Phleboliths are noted in the pelvis. IMPRESSION: Stable small bowel dilatation concerning for distal small bowel obstruction. Electronically Signed   By: Marijo Conception, M.D.   On: 04/21/2018 08:38   Dg Abd 1 View  Result Date: 04/20/2018 CLINICAL DATA:  Evaluate for small bowel obstruction and NG tube placement EXAM: ABDOMEN - 1 VIEW COMPARISON:  04/19/2018 FINDINGS: NG tube tip is in the distal esophagus with the side port in the mid esophagus. This should be advanced several cm. Small bowel dilatation noted in the abdomen compatible with small bowel obstruction as seen on prior study. Bibasilar  atelectasis. IMPRESSION: NG tube tip in the distal esophagus. Recommend advancing several cm. Small bowel obstruction pattern, stable. Electronically Signed   By: Rolm Baptise M.D.   On: 04/20/2018 08:16   Dg Abd 1 View  Result Date: 04/19/2018 CLINICAL DATA:  Small bowel obstruction. EXAM: ABDOMEN - 1 VIEW COMPARISON:  CT abdomen and pelvis April 17, 2018; abdominal radiograph April 18, 2018 FINDINGS: Nasogastric tube tip is in the gastric cardia region. The side-port is not seen but is felt to be above the diaphragm. There remain loops of dilated small bowel, similar to 1 day prior. No free air. There are phleboliths in the pelvis. IMPRESSION: Findings indicative of a degree of persistent bowel obstruction. No free air. Nasogastric tube tip in proximal stomach with side port above the diaphragm. Advise advancing nasogastric tube approximately 8 cm to insure that side-port as well as nasogastric tube tip are within the stomach. Electronically Signed   By: Lowella Grip III M.D.   On: 04/19/2018 07:55   Ct Abdomen Pelvis W Contrast  Result Date: 04/17/2018  CLINICAL DATA:  Constipation, abdominal pain, vomiting EXAM: CT ABDOMEN AND PELVIS WITH CONTRAST TECHNIQUE: Multidetector CT imaging of the abdomen and pelvis was performed using the standard protocol following bolus administration of intravenous contrast. CONTRAST:  192mL OMNIPAQUE IOHEXOL 300 MG/ML  SOLN COMPARISON:  None. FINDINGS: Lower chest: Bilateral breast implants partially imaged. Linear atelectasis or scarring at the right base. No effusions. Hepatobiliary: No focal hepatic abnormality. Gallbladder unremarkable. Pancreas: No focal abnormality or ductal dilatation. Spleen: No focal abnormality.  Normal size. Adrenals/Urinary Tract: No adrenal abnormality. No focal renal abnormality. No stones or hydronephrosis. Urinary bladder is unremarkable. Stomach/Bowel: Dilated fluid-filled small bowel loops with air-fluid levels noted throughout the  abdomen and pelvis. Distal small bowel loops appear decompressed. Exact transition is not visualize, but findings compatible with moderate to high-grade partial small bowel obstruction. Large bowel decompressed, grossly unremarkable. Vascular/Lymphatic: No evidence of aneurysm or adenopathy. Reproductive: Prior hysterectomy.  No adnexal masses. Other: Small amount of free fluid in the pelvis.  No free air. Musculoskeletal: No acute bony abnormality. L5 related grade 1 anterolisthesis of to facet disease L4 on. Degenerative disc disease at L4-5. IMPRESSION: Findings compatible with moderate to high-grade partial small bowel obstruction. Exact transition and cause not visualized, presumably adhesions. Prior hysterectomy. Electronically Signed   By: Rolm Baptise M.D.   On: 04/17/2018 09:11   Dg Chest Port 1 View  Result Date: 04/24/2018 CLINICAL DATA:  Chest pain and shortness of breath.  Leukocytosis. EXAM: PORTABLE CHEST 1 VIEW COMPARISON:  03/31/2018. FINDINGS: Interval nasogastric tube with its tip at the level of the clavicles and side hole in the lower neck. Interval right PICC with its tip at the superior cavoatrial junction. Normal sized heart. Decreased inspiration with bibasilar airspace opacity, linear densities and small pleural effusions. Normal appearing bones. IMPRESSION: 1. Poor inspiration with bibasilar atelectasis and small bilateral pleural effusions. Pneumonia cannot be excluded at either lung base. 2. Nasogastric tube tip at the level of the clavicles and side hole in the lower neck. Electronically Signed   By: Claudie Revering M.D.   On: 04/24/2018 19:27   Dg Abd Portable 1v  Result Date: 04/22/2018 CLINICAL DATA:  Follow up small bowel obstruction EXAM: PORTABLE ABDOMEN - 1 VIEW COMPARISON:  04/21/2018 FINDINGS: Multiple dilated loops of small bowel are again identified roughly similar to that seen on the prior exam. The bladder is distended. No free air is seen. No acute bony abnormality is  noted. IMPRESSION: Stable small bowel dilatation. Electronically Signed   By: Inez Catalina M.D.   On: 04/22/2018 07:02   Dg Abd Portable 1v-small Bowel Obstruction Protocol-24 Hr Delay  Result Date: 04/18/2018 CLINICAL DATA:  Small-bowel obstruction, 24 hour delay EXAM: PORTABLE ABDOMEN - 1 VIEW COMPARISON:  Portable exam 1635 hours compared to 04/18/2018 FINDINGS: Tip of orogastric tube projects over proximal stomach though the proximal side-port projects over the distal esophagus; recommend advancing tube 7 cm. Small amount gas within stomach. Persistent significant gaseous distention of small bowel loops consistent with small bowel obstruction, small bowel measuring up to 4.2 cm transverse slightly less than the 4.6 cm on the prior exam. No colonic gas. Questionable mild wall thickening of a single small bowel loop in the LEFT upper quadrant. New osseous structures unremarkable. Lung bases clear. IMPRESSION: Persistent small bowel obstruction. Electronically Signed   By: Lavonia Dana M.D.   On: 04/18/2018 16:57   Dg Abd Portable 1v-small Bowel Obstruction Protocol-initial, 8 Hr Delay  Result Date: 04/18/2018 CLINICAL DATA:  Follow up small bowel obstruction. EXAM: PORTABLE ABDOMEN - 1 VIEW COMPARISON:  Abdominal radiograph April 17, 2018 FINDINGS: Persistent gas distended small bowel measuring to 4.2 cm. Paucity of large bowel gas. Nasogastric tube tip projects at proximal stomach. Phleboliths project in the pelvis. Soft tissue and osseous structures non suspicious. IMPRESSION: Unchanged small bowel obstruction. Nasogastric tube tip projects in proximal stomach. Electronically Signed   By: Elon Alas M.D.   On: 04/18/2018 01:42   Dg Abd Portable 1v-small Bowel Protocol-position Verification  Result Date: 04/17/2018 CLINICAL DATA:  Confirm NG tube placement. EXAM: PORTABLE ABDOMEN - 1 VIEW COMPARISON:  04/17/2018. FINDINGS: Enteric tube is identified with tip 2.8 cm below the level of the GE  junction. The side port is 5 cm above the GE junction. Persistent small bowel dilatation compatible with obstruction. IMPRESSION: The tip of the NG tube projects over the proximal stomach just below the level of the GE junction and the side port is above the GE junction. Advise further tube advancement such that the side port is at least below the GE junction. Electronically Signed   By: Kerby Moors M.D.   On: 04/17/2018 10:50   [place] Ekg Site Rite  Result Date: 04/24/2018 If Site Rite image not attached, placement could not be confirmed due to current cardiac rhythm.     Subjective: - no chest pain, shortness of breath, no abdominal pain, nausea or vomiting.   Discharge Exam: Vitals:   04/30/18 2127 05/01/18 0526  BP: 106/61 130/77  Pulse: 76 77  Resp:  18  Temp: 98.1 F (36.7 C) 97.7 F (36.5 C)  SpO2: 99% 99%    General: Pt is alert, awake, not in acute distress Cardiovascular: RRR, S1/S2 +, no rubs, no gallops Respiratory: CTA bilaterally, no wheezing, no rhonchi Abdominal: Soft, NT, ND, bowel sounds + Extremities: no edema, no cyanosis    The results of significant diagnostics from this hospitalization (including imaging, microbiology, ancillary and laboratory) are listed below for reference.     Microbiology: Recent Results (from the past 240 hour(s))  Culture, Urine     Status: Abnormal   Collection Time: 04/29/18 11:20 AM  Result Value Ref Range Status   Specimen Description URINE, CLEAN CATCH  Final   Special Requests   Final    NONE Performed at [facility], [address] [place], [place] [address]    Culture MULTIPLE SPECIES PRESENT, SUGGEST RECOLLECTION (A)  Final   Report Status 04/30/2018 FINAL  Final     Labs: BNP (last 3 results) No results for input(s): BNP in the last 8760 hours. Basic Metabolic Panel: Recent Labs  Lab 04/25/18 0408 04/26/18 0500 04/27/18 0430 04/28/18 0420 04/29/18 0400  NA 135 134* 138 136 137  K 4.0 3.6 4.5  4.0 3.8  CL 99* 98 104 101 98  CO2 30 28 28 27 30   GLUCOSE 108* 114* 103* 100* 87  BUN 7 7* 7* 6* 6*  CREATININE 0.62 0.62 0.56 0.57 0.75  CALCIUM 8.2* 8.0* 8.5* 8.2* 8.8*  MG 1.8 1.9 2.0 1.9  --   PHOS 2.8 3.5 3.8 4.9*  --    Liver Function Tests: Recent Labs  Lab 04/25/18 0408 04/28/18 0420  AST 23 18  ALT 17 18  ALKPHOS 53 74  BILITOT 0.6 0.4  PROT 5.1* 5.5*  ALBUMIN 2.5* 2.7*   No results for input(s): LIPASE, AMYLASE in the last 168 hours. No results for input(s): AMMONIA in the last 168 hours.  CBC: Recent Labs  Lab 04/25/18 0408 04/26/18 0500 04/27/18 0827 04/28/18 0420 04/29/18 0400 04/30/18 0242  WBC 13.4* 14.1* 11.3* 12.7* 11.8* 11.3*  NEUTROABS 11.5*  --   --   --   --   --   HGB 10.3* 10.5* 9.9* 10.1* 9.8* 10.1*  HCT 31.4* 31.9* 30.9* 31.0* 30.4* 31.5*  MCV 104.0* 100.3* 102.0* 102.3* 102.7* 104.0*  PLT 309 322 317 306 319 334   Cardiac Enzymes: No results for input(s): CKTOTAL, CKMB, CKMBINDEX, TROPONINI in the last 168 hours. BNP: Invalid input(s): POCBNP CBG: Recent Labs  Lab 04/26/18 2345 04/27/18 0440 04/27/18 0740 04/27/18 1244 04/27/18 1601  GLUCAP 109* 97 94 88 96   D-Dimer No results for input(s): DDIMER in the last 72 hours. Hgb A1c No results for input(s): HGBA1C in the last 72 hours. Lipid Profile No results for input(s): CHOL, HDL, LDLCALC, TRIG, CHOLHDL, LDLDIRECT in the last 72 hours. Thyroid function studies No results for input(s): TSH, T4TOTAL, T3FREE, THYROIDAB in the last 72 hours.  Invalid input(s): FREET3 Anemia work up No results for input(s): VITAMINB12, FOLATE, FERRITIN, TIBC, IRON, RETICCTPCT in the last 72 hours. Urinalysis    Component Value Date/Time   COLORURINE YELLOW 04/29/2018 [place] 04/29/2018 1112   LABSPEC 1.012 04/29/2018 1112   LABSPEC 1.030 08/04/2017 0920   PHURINE 7.0 04/29/2018 1112   GLUCOSEU NEGATIVE 04/29/2018 1112   HGBUR NEGATIVE 04/29/2018 1112   BILIRUBINUR  NEGATIVE 04/29/2018 1112   BILIRUBINUR negative 08/04/2017 0920   BILIRUBINUR n 09/16/2016 1559   KETONESUR NEGATIVE 04/29/2018 1112   PROTEINUR NEGATIVE 04/29/2018 1112   UROBILINOGEN 0.2 09/16/2016 1559   UROBILINOGEN 2 (H) 05/23/2015 1252   NITRITE NEGATIVE 04/29/2018 1112   LEUKOCYTESUR NEGATIVE 04/29/2018 1112   Sepsis Labs Invalid input(s): PROCALCITONIN,  WBC,  LACTICIDVEN   Time coordinating discharge: 25 minutes  SIGNED:  Marzetta Board, MD  [facility] 05/01/2018, 2:40 PM Pager [phone]  If 7PM-7AM, please contact night-coverage [email or web address] Password [email or web address]

## 2018-05-03 DIAGNOSIS — M1712 Unilateral primary osteoarthritis, left knee: Secondary | ICD-10-CM | POA: Diagnosis not present

## 2018-05-03 DIAGNOSIS — Z471 Aftercare following joint replacement surgery: Secondary | ICD-10-CM | POA: Diagnosis not present

## 2018-05-03 DIAGNOSIS — K56609 Unspecified intestinal obstruction, unspecified as to partial versus complete obstruction: Secondary | ICD-10-CM | POA: Diagnosis not present

## 2018-05-04 DIAGNOSIS — M1712 Unilateral primary osteoarthritis, left knee: Secondary | ICD-10-CM | POA: Diagnosis not present

## 2018-05-04 DIAGNOSIS — Z471 Aftercare following joint replacement surgery: Secondary | ICD-10-CM | POA: Diagnosis not present

## 2018-05-04 DIAGNOSIS — K56609 Unspecified intestinal obstruction, unspecified as to partial versus complete obstruction: Secondary | ICD-10-CM | POA: Diagnosis not present

## 2018-05-04 DIAGNOSIS — M1711 Unilateral primary osteoarthritis, right knee: Secondary | ICD-10-CM | POA: Diagnosis not present

## 2018-05-06 DIAGNOSIS — M1712 Unilateral primary osteoarthritis, left knee: Secondary | ICD-10-CM | POA: Diagnosis not present

## 2018-05-06 DIAGNOSIS — K56609 Unspecified intestinal obstruction, unspecified as to partial versus complete obstruction: Secondary | ICD-10-CM | POA: Diagnosis not present

## 2018-05-06 DIAGNOSIS — Z471 Aftercare following joint replacement surgery: Secondary | ICD-10-CM | POA: Diagnosis not present

## 2018-05-09 DIAGNOSIS — Z471 Aftercare following joint replacement surgery: Secondary | ICD-10-CM | POA: Diagnosis not present

## 2018-05-09 DIAGNOSIS — K56609 Unspecified intestinal obstruction, unspecified as to partial versus complete obstruction: Secondary | ICD-10-CM | POA: Diagnosis not present

## 2018-05-09 DIAGNOSIS — M1712 Unilateral primary osteoarthritis, left knee: Secondary | ICD-10-CM | POA: Diagnosis not present

## 2018-05-10 DIAGNOSIS — Z471 Aftercare following joint replacement surgery: Secondary | ICD-10-CM | POA: Diagnosis not present

## 2018-05-10 DIAGNOSIS — M1712 Unilateral primary osteoarthritis, left knee: Secondary | ICD-10-CM | POA: Diagnosis not present

## 2018-05-10 DIAGNOSIS — K56609 Unspecified intestinal obstruction, unspecified as to partial versus complete obstruction: Secondary | ICD-10-CM | POA: Diagnosis not present

## 2018-05-12 DIAGNOSIS — K56609 Unspecified intestinal obstruction, unspecified as to partial versus complete obstruction: Secondary | ICD-10-CM | POA: Diagnosis not present

## 2018-05-12 DIAGNOSIS — M1712 Unilateral primary osteoarthritis, left knee: Secondary | ICD-10-CM | POA: Diagnosis not present

## 2018-05-12 DIAGNOSIS — Z471 Aftercare following joint replacement surgery: Secondary | ICD-10-CM | POA: Diagnosis not present

## 2018-05-12 MED FILL — GABAPENTIN 600 MG TABLET: 600 | 30 days supply | Qty: 120 | Fill #3 | Status: TO

## 2018-05-13 DIAGNOSIS — M25561 Pain in right knee: Secondary | ICD-10-CM | POA: Diagnosis not present

## 2018-05-13 DIAGNOSIS — Z96651 Presence of right artificial knee joint: Secondary | ICD-10-CM | POA: Diagnosis not present

## 2018-05-15 MED FILL — ESTRADIOL 1 MG TABS: 1 | 30 days supply | Qty: 30 | Fill #3

## 2018-05-16 DIAGNOSIS — Z96651 Presence of right artificial knee joint: Secondary | ICD-10-CM | POA: Diagnosis not present

## 2018-05-16 DIAGNOSIS — M25561 Pain in right knee: Secondary | ICD-10-CM | POA: Diagnosis not present

## 2018-05-16 MED FILL — lamoTRIgine 100 MG TABS: 100 | 30 days supply | Qty: 60 | Fill #2 | Status: TO

## 2018-05-16 MED FILL — SIMVASTATIN 20 MG TABLET: 20 | 30 days supply | Qty: 30 | Fill #4 | Status: TO

## 2018-05-16 MED FILL — VIT D2 1.25 MG (50,000 UNIT: 1.25 MG | 28 days supply | Qty: 4 | Fill #2 | Status: TO

## 2018-05-18 DIAGNOSIS — Z96651 Presence of right artificial knee joint: Secondary | ICD-10-CM | POA: Diagnosis not present

## 2018-05-18 DIAGNOSIS — M25561 Pain in right knee: Secondary | ICD-10-CM | POA: Diagnosis not present

## 2018-05-19 DIAGNOSIS — M25561 Pain in right knee: Secondary | ICD-10-CM | POA: Diagnosis not present

## 2018-05-19 DIAGNOSIS — Z96651 Presence of right artificial knee joint: Secondary | ICD-10-CM | POA: Diagnosis not present

## 2018-05-23 DIAGNOSIS — M25561 Pain in right knee: Secondary | ICD-10-CM | POA: Diagnosis not present

## 2018-05-23 DIAGNOSIS — Z96651 Presence of right artificial knee joint: Secondary | ICD-10-CM | POA: Diagnosis not present

## 2018-05-23 MED FILL — VRAYLAR 1.5 MG CAPSULE: 1.5 | 30 days supply | Qty: 30 | Fill #0

## 2018-05-25 DIAGNOSIS — Z96651 Presence of right artificial knee joint: Secondary | ICD-10-CM | POA: Diagnosis not present

## 2018-05-25 DIAGNOSIS — M25561 Pain in right knee: Secondary | ICD-10-CM | POA: Diagnosis not present

## 2018-06-02 DIAGNOSIS — Z96651 Presence of right artificial knee joint: Secondary | ICD-10-CM | POA: Diagnosis not present

## 2018-06-02 DIAGNOSIS — M24661 Ankylosis, right knee: Secondary | ICD-10-CM | POA: Diagnosis not present

## 2018-06-02 DIAGNOSIS — T8489XA Other specified complication of internal orthopedic prosthetic devices, implants and grafts, initial encounter: Secondary | ICD-10-CM | POA: Diagnosis not present

## 2018-06-02 DIAGNOSIS — G8918 Other acute postprocedural pain: Secondary | ICD-10-CM | POA: Diagnosis not present

## 2018-06-03 DIAGNOSIS — Z471 Aftercare following joint replacement surgery: Secondary | ICD-10-CM | POA: Diagnosis not present

## 2018-06-03 DIAGNOSIS — M25661 Stiffness of right knee, not elsewhere classified: Secondary | ICD-10-CM | POA: Diagnosis not present

## 2018-06-03 DIAGNOSIS — Z96651 Presence of right artificial knee joint: Secondary | ICD-10-CM | POA: Diagnosis not present

## 2018-06-06 DIAGNOSIS — Z471 Aftercare following joint replacement surgery: Secondary | ICD-10-CM | POA: Diagnosis not present

## 2018-06-06 DIAGNOSIS — M25661 Stiffness of right knee, not elsewhere classified: Secondary | ICD-10-CM | POA: Diagnosis not present

## 2018-06-06 DIAGNOSIS — Z96651 Presence of right artificial knee joint: Secondary | ICD-10-CM | POA: Diagnosis not present

## 2018-06-07 DIAGNOSIS — Z96651 Presence of right artificial knee joint: Secondary | ICD-10-CM | POA: Diagnosis not present

## 2018-06-07 DIAGNOSIS — Z471 Aftercare following joint replacement surgery: Secondary | ICD-10-CM | POA: Diagnosis not present

## 2018-06-07 DIAGNOSIS — M25661 Stiffness of right knee, not elsewhere classified: Secondary | ICD-10-CM | POA: Diagnosis not present

## 2018-06-08 DIAGNOSIS — M25661 Stiffness of right knee, not elsewhere classified: Secondary | ICD-10-CM | POA: Diagnosis not present

## 2018-06-08 DIAGNOSIS — Z471 Aftercare following joint replacement surgery: Secondary | ICD-10-CM | POA: Diagnosis not present

## 2018-06-08 DIAGNOSIS — Z96651 Presence of right artificial knee joint: Secondary | ICD-10-CM | POA: Diagnosis not present

## 2018-06-09 DIAGNOSIS — Z471 Aftercare following joint replacement surgery: Secondary | ICD-10-CM | POA: Diagnosis not present

## 2018-06-09 DIAGNOSIS — Z96651 Presence of right artificial knee joint: Secondary | ICD-10-CM | POA: Diagnosis not present

## 2018-06-09 DIAGNOSIS — M25661 Stiffness of right knee, not elsewhere classified: Secondary | ICD-10-CM | POA: Diagnosis not present

## 2018-06-10 DIAGNOSIS — M25661 Stiffness of right knee, not elsewhere classified: Secondary | ICD-10-CM | POA: Diagnosis not present

## 2018-06-10 DIAGNOSIS — Z471 Aftercare following joint replacement surgery: Secondary | ICD-10-CM | POA: Diagnosis not present

## 2018-06-10 DIAGNOSIS — Z96651 Presence of right artificial knee joint: Secondary | ICD-10-CM | POA: Diagnosis not present

## 2018-06-13 DIAGNOSIS — M25661 Stiffness of right knee, not elsewhere classified: Secondary | ICD-10-CM | POA: Diagnosis not present

## 2018-06-13 DIAGNOSIS — Z471 Aftercare following joint replacement surgery: Secondary | ICD-10-CM | POA: Diagnosis not present

## 2018-06-13 DIAGNOSIS — Z96651 Presence of right artificial knee joint: Secondary | ICD-10-CM | POA: Diagnosis not present

## 2018-06-15 DIAGNOSIS — Z471 Aftercare following joint replacement surgery: Secondary | ICD-10-CM | POA: Diagnosis not present

## 2018-06-15 DIAGNOSIS — Z96651 Presence of right artificial knee joint: Secondary | ICD-10-CM | POA: Diagnosis not present

## 2018-06-15 DIAGNOSIS — M25661 Stiffness of right knee, not elsewhere classified: Secondary | ICD-10-CM | POA: Diagnosis not present

## 2018-06-15 MED FILL — OXYCODONE-ACETAMINOPHEN 5-3: 5-325 | 7 days supply | Qty: 40 | Fill #0

## 2018-06-17 DIAGNOSIS — Z471 Aftercare following joint replacement surgery: Secondary | ICD-10-CM | POA: Diagnosis not present

## 2018-06-17 DIAGNOSIS — Z96651 Presence of right artificial knee joint: Secondary | ICD-10-CM | POA: Diagnosis not present

## 2018-06-17 DIAGNOSIS — M25661 Stiffness of right knee, not elsewhere classified: Secondary | ICD-10-CM | POA: Diagnosis not present

## 2018-06-20 DIAGNOSIS — Z471 Aftercare following joint replacement surgery: Secondary | ICD-10-CM | POA: Diagnosis not present

## 2018-06-20 DIAGNOSIS — Z96651 Presence of right artificial knee joint: Secondary | ICD-10-CM | POA: Diagnosis not present

## 2018-06-20 DIAGNOSIS — M25661 Stiffness of right knee, not elsewhere classified: Secondary | ICD-10-CM | POA: Diagnosis not present

## 2018-06-22 DIAGNOSIS — Z471 Aftercare following joint replacement surgery: Secondary | ICD-10-CM | POA: Diagnosis not present

## 2018-06-22 DIAGNOSIS — M25661 Stiffness of right knee, not elsewhere classified: Secondary | ICD-10-CM | POA: Diagnosis not present

## 2018-06-22 DIAGNOSIS — Z96651 Presence of right artificial knee joint: Secondary | ICD-10-CM | POA: Diagnosis not present

## 2018-06-24 DIAGNOSIS — Z96651 Presence of right artificial knee joint: Secondary | ICD-10-CM | POA: Diagnosis not present

## 2018-06-24 DIAGNOSIS — Z471 Aftercare following joint replacement surgery: Secondary | ICD-10-CM | POA: Diagnosis not present

## 2018-06-24 DIAGNOSIS — M25661 Stiffness of right knee, not elsewhere classified: Secondary | ICD-10-CM | POA: Diagnosis not present

## 2018-06-28 DIAGNOSIS — Z96651 Presence of right artificial knee joint: Secondary | ICD-10-CM | POA: Diagnosis not present

## 2018-06-28 DIAGNOSIS — M25661 Stiffness of right knee, not elsewhere classified: Secondary | ICD-10-CM | POA: Diagnosis not present

## 2018-06-28 DIAGNOSIS — Z471 Aftercare following joint replacement surgery: Secondary | ICD-10-CM | POA: Diagnosis not present

## 2018-07-12 DIAGNOSIS — Z471 Aftercare following joint replacement surgery: Secondary | ICD-10-CM | POA: Diagnosis not present

## 2018-07-12 DIAGNOSIS — M25661 Stiffness of right knee, not elsewhere classified: Secondary | ICD-10-CM | POA: Diagnosis not present

## 2018-07-12 DIAGNOSIS — Z96651 Presence of right artificial knee joint: Secondary | ICD-10-CM | POA: Diagnosis not present

## 2018-07-14 DIAGNOSIS — M25661 Stiffness of right knee, not elsewhere classified: Secondary | ICD-10-CM | POA: Diagnosis not present

## 2018-07-14 DIAGNOSIS — Z471 Aftercare following joint replacement surgery: Secondary | ICD-10-CM | POA: Diagnosis not present

## 2018-07-14 DIAGNOSIS — Z96651 Presence of right artificial knee joint: Secondary | ICD-10-CM | POA: Diagnosis not present

## 2018-07-21 ENCOUNTER — Ambulatory Visit: Payer: Self-pay | Admitting: Surgery

## 2018-07-25 DIAGNOSIS — M25661 Stiffness of right knee, not elsewhere classified: Secondary | ICD-10-CM | POA: Diagnosis not present

## 2018-07-27 NOTE — Progress Notes (Signed)
[name] [place], [place] [address] Phone: [phone] Subjective:    I Bethany [name] am serving as a Education administrator for Dr. Hulan Saas.  CC: Right knee pain  NAT:[id number]  Bethany [name] Edmunds is a 62 y.o. female coming in with complaint of right knee pain. States that the knee is painful. Has a knee replacement.  Continues to have significant amount of pain.  Patient states that unfortunately continues to have pain.  Had difficulty after the knee replacement.  Patient did have a small bowel obstruction that did not need surgical intervention.  In addition to that patient is already gone under 1 manipulation of the knee.  Patient continues to have difficulty with pain on a daily basis, decreased range of motion.     Past Medical History:  Diagnosis Date  . Anxiety   . Arthritis   . Cervical dysplasia 1990   CIN 2 subsequent cryosurgery. All Paps normal afterwards  . Dyslipidemia   . Herpes genitalis   . Herpes labialis   . Hypercholesteremia   . IBS (irritable bowel syndrome)   . Osteopenia 04/2014   T score -1.7 FRAX 7.3%/0.8% stable from prior DEXA 2013  . Raynaud disease    Past Surgical History:  Procedure Laterality Date  . AUGMENTATION MAMMAPLASTY    . BREAST ENHANCEMENT SURGERY  2006  . COSMETIC SURGERY    . EYE SURGERY     lasik od  . GYNECOLOGIC CRYOSURGERY  1990  . LAPAROTOMY N/A 04/22/2018   Procedure: EXPLORATORY LAPAROTOMY;  Surgeon: Erroll Luna, MD;  Location: [facility];  Service: General;  Laterality: N/A;  . LIGAMENT REPAIR Right 09/24/2015   Procedure: RIGHT INDEX METACARPAL PHALANGEAL RADIAL COLLATERAL LIGAMENT REPAIR;  Surgeon: Leanora Cover, MD;  Location: [facility];  Service: Orthopedics;  Laterality: Right;  . OOPHORECTOMY     BSO  . Right knee arthoscopy Right 09/09/2017   [facility]  . TONSILLECTOMY AND ADENOIDECTOMY    . TOTAL KNEE ARTHROPLASTY Right 04/12/2018  . TOTAL KNEE ARTHROPLASTY  Right 04/12/2018   Procedure: RIGHT TOTAL KNEE ARTHROPLASTY;  Surgeon: Melrose Nakayama, MD;  Location: [facility];  Service: Orthopedics;  Laterality: Right;  . TUBAL LIGATION    . UPPER GASTROINTESTINAL ENDOSCOPY  04/24/05  . VAGINAL HYSTERECTOMY  2005   TVH BSO  adenomyosis   Social History   Socioeconomic History  . Marital status: Married    Spouse name: Not on file  . Number of children: Not on file  . Years of education: Not on file  . Highest education level: Not on file  Occupational History  . Not on file  Social Needs  . Financial resource strain: Not on file  . Food insecurity:    Worry: Not on file    Inability: Not on file  . Transportation needs:    Medical: Not on file    Non-medical: Not on file  Tobacco Use  . Smoking status: Former Smoker    Last attempt to quit: 03/21/2006    Years since quitting: 12.3  . Smokeless tobacco: Never Used  Substance and Sexual Activity  . Alcohol use: No    Alcohol/week: 0.0 standard drinks    Comment: drinking in 1993  . Drug use: No  . Sexual activity: Yes    Birth control/protection: Surgical    Comment: HYST-1st intercourse 62 yo-More than 5 partners  Lifestyle  . Physical activity:    Days per week: Not on  file    Minutes per session: Not on file  . Stress: Not on file  Relationships  . Social connections:    Talks on phone: Not on file    Gets together: Not on file    Attends religious service: Not on file    Active member of club or organization: Not on file    Attends meetings of clubs or organizations: Not on file    Relationship status: Not on file  Other Topics Concern  . Not on file  Social History Narrative  . Not on file   Allergies  Allergen Reactions  . Morphine And Related Nausea Only   Family History  Problem Relation Age of Onset  . Hypertension Mother   . Cancer Father 11       Lymphoma  . Breast cancer Sister 52  . Colon cancer Neg Hx     Current Outpatient Medications (Endocrine &  Metabolic):  .  estradiol (ESTRACE) 1 MG tablet, Take 1 tablet (1 mg total) by mouth daily.  Current Outpatient Medications (Cardiovascular):  .  simvastatin (ZOCOR) 20 MG tablet, Take 1 tablet (20 mg total) by mouth daily.  Current Outpatient Medications (Respiratory):  .  promethazine (PHENERGAN) 12.5 MG tablet, Take 1-2 tablets (12.5-25 mg total) by mouth every 6 (six) hours as needed for nausea or vomiting. .  montelukast (SINGULAIR) 10 MG tablet, Take 1 tablet (10 mg total) by mouth at bedtime.  Current Outpatient Medications (Analgesics):  .  aspirin EC 325 MG EC tablet, Take 1 tablet (325 mg total) by mouth 2 (two) times daily after a meal. .  oxyCODONE-acetaminophen (PERCOCET) 5-325 MG tablet, Take 1-2 tablets by mouth every 6 (six) hours as needed for severe pain. .  traMADol (ULTRAM) 50 MG tablet, Take 50 mg by mouth every 6 (six) hours as needed.   Current Outpatient Medications (Other):  .  b complex vitamins tablet, Take 1 tablet by mouth daily. .  bisacodyl (DULCOLAX) 5 MG EC tablet, Take 1 tablet (5 mg total) by mouth daily as needed for moderate constipation. .  Carboxymethylcellulose Sodium (THERATEARS) 0.25 % SOLN, Place 1 drop into both eyes 2 (two) times daily as needed (dry eyes). .  cholecalciferol (VITAMIN D) 1000 UNITS tablet, Take 1,000 Units by mouth daily. Marland Kitchen  docusate sodium (COLACE) 100 MG capsule, Take 1 capsule (100 mg total) by mouth 2 (two) times daily. Marland Kitchen  gabapentin (NEURONTIN) 600 MG tablet, Take 1,200 mg by mouth 2 (two) times daily.  Marland Kitchen  lamoTRIgine (LAMICTAL) 100 MG tablet, Take 100 mg by mouth 2 (two) times daily.  Marland Kitchen  PROCTOZONE-HC 2.5 % rectal cream, apply rectally twice a day (Patient taking differently: Apply rectally twice a day as needed for irritations) .  tiZANidine (ZANAFLEX) 4 MG tablet, Take 1 tablet (4 mg total) by mouth every 6 (six) hours as needed. .  vitamin C (ASCORBIC ACID) 500 MG tablet, Take 500 mg by mouth daily. .  Vitamin D,  Ergocalciferol, (DRISDOL) 50000 units CAPS capsule, TAKE 1 CAPSULE BY MOUTH EVERY 7 DAYS    Past medical history, social, surgical and family history all reviewed in electronic medical record.  No pertanent information unless stated regarding to the chief complaint.   Review of Systems:  No headache, visual changes, nausea, vomiting, diarrhea, constipation, dizziness, abdominal pain, skin rash, fevers, chills, night sweats, weight loss, swollen lymph nodes, body aches, chest pain, shortness of breath, mood changes.  Positive muscle aches, joint swelling  Objective  Blood pressure 100/80, pulse 69, height 5\' 5"  (1.651 m), weight 114 lb (51.7 kg), SpO2 98 %.    General: No apparent distress alert and oriented x3 mood and affect normal, dressed appropriately.  HEENT: Pupils equal, extraocular movements intact  Respiratory: Patient's speak in full sentences and does not appear short of breath  Cardiovascular: No lower extremity edema, non tender, no erythema  Skin: Warm dry intact with no signs of infection or rash on extremities or on axial skeleton.  Abdomen: Soft nontender  Neuro: Cranial nerves II through XII are intact, neurovascularly intact in all extremities with 2+ DTRs and 2+ pulses.  Lymph: No lymphadenopathy of posterior or anterior cervical chain or axillae bilaterally.  Gait antalgic MSK:  Non tender with full range of motion and good stability and symmetric strength and tone of shoulders, elbows, wrist, hip, and ankles bilaterally.  Right knee exam shows recent surgery.  Still some swelling noted.  Lacks last 5 degrees of extension and only has 90 degrees of flexion.  Stability no noted.  Mild warm to touch.  Tender over the lateral joint line. Contralateral knee mild arthritis  MSK US performed of: Right knee This study was ordered, performed, and interpreted by Charlann Boxer D.O.  Knee: Patient does have significant amount of scar tissue with increasing Doppler flow over the  lateral joint space.  No true though soft tissue swelling noted.  IMPRESSION: Scar tissue right knee no signs of infectious etiology   Impression and Recommendations:     This case required medical decision making of moderate complexity. The above documentation has been reviewed and is accurate and complete Lyndal Pulley, DO       Note: This dictation was prepared with Dragon dictation along with smaller phrase technology. Any transcriptional errors that result from this process are unintentional.

## 2018-07-28 ENCOUNTER — Ambulatory Visit (INDEPENDENT_AMBULATORY_CARE_PROVIDER_SITE_OTHER): Payer: 59 | Admitting: Family Medicine

## 2018-07-28 ENCOUNTER — Encounter: Payer: Self-pay | Admitting: Family Medicine

## 2018-07-28 ENCOUNTER — Other Ambulatory Visit (INDEPENDENT_AMBULATORY_CARE_PROVIDER_SITE_OTHER): Payer: 59

## 2018-07-28 VITALS — BP 100/80 | HR 69 | Ht 65.0 in | Wt 114.0 lb

## 2018-07-28 DIAGNOSIS — T8484XA Pain due to internal orthopedic prosthetic devices, implants and grafts, initial encounter: Secondary | ICD-10-CM

## 2018-07-28 DIAGNOSIS — Z23 Encounter for immunization: Secondary | ICD-10-CM | POA: Diagnosis not present

## 2018-07-28 DIAGNOSIS — M2550 Pain in unspecified joint: Secondary | ICD-10-CM

## 2018-07-28 DIAGNOSIS — Z96659 Presence of unspecified artificial knee joint: Secondary | ICD-10-CM | POA: Diagnosis not present

## 2018-07-28 LAB — CBC WITH DIFFERENTIAL/PLATELET
BASOS ABS: 0.1 10*3/uL (ref 0.0–0.1)
Basophils Relative: 1.3 % (ref 0.0–3.0)
EOS ABS: 0.1 10*3/uL (ref 0.0–0.7)
Eosinophils Relative: 1.4 % (ref 0.0–5.0)
HCT: 40.2 % (ref 36.0–46.0)
Hemoglobin: 13.5 g/dL (ref 12.0–15.0)
LYMPHS ABS: 2 10*3/uL (ref 0.7–4.0)
Lymphocytes Relative: 39.7 % (ref 12.0–46.0)
MCHC: 33.7 g/dL (ref 30.0–36.0)
MCV: 97.5 fl (ref 78.0–100.0)
Monocytes Absolute: 0.4 10*3/uL (ref 0.1–1.0)
Monocytes Relative: 8.9 % (ref 3.0–12.0)
NEUTROS PCT: 48.7 % (ref 43.0–77.0)
Neutro Abs: 2.4 10*3/uL (ref 1.4–7.7)
PLATELETS: 233 10*3/uL (ref 150.0–400.0)
RBC: 4.13 Mil/uL (ref 3.87–5.11)
RDW: 12.6 % (ref 11.5–15.5)
WBC: 5 10*3/uL (ref 4.0–10.5)

## 2018-07-28 LAB — C-REACTIVE PROTEIN: CRP: <0.1 mg/dL — ABNORMAL LOW (ref 0.5–20.0)

## 2018-07-28 LAB — SEDIMENTATION RATE: SED RATE: 29 mm/h (ref 0–30)

## 2018-07-28 LAB — URIC ACID: Uric Acid, Serum: 3.4 mg/dL (ref 2.4–7.0)

## 2018-07-28 MED ORDER — MONTELUKAST SODIUM 10 MG PO TABS: 10.0000 mg | ORAL_TABLET | Freq: Every day | ORAL | 3 refills | Status: DC

## 2018-07-28 NOTE — Assessment & Plan Note (Signed)
Total knee replacement noted.  Does seem to have good amount of stability.  Warm to touch but no true infectious etiology.  Scar tissue noted.  Already has had one manipulation.  Patient has been very frustrated with this and has had difficulty.  Patient given Singulair in case there is any type of metal allergy.  Laboratory work-up to make sure there is no infectious etiology.  Patient will try to be active and continue range of motion.  Patient is working with physical therapy.  Follow-up with me again 4 to 6 weeks

## 2018-07-28 NOTE — Patient Instructions (Signed)
So good to see you  Heel lift 1/8 inch in right shoe Singulair daily to help  Hand massager to the knee daily  Keep up with the PT  We will get labs downstairs See me again in 4 weeks

## 2018-07-29 ENCOUNTER — Encounter: Payer: Self-pay | Admitting: Family Medicine

## 2018-07-29 ENCOUNTER — Other Ambulatory Visit: Payer: Self-pay

## 2018-07-29 ENCOUNTER — Encounter (HOSPITAL_BASED_OUTPATIENT_CLINIC_OR_DEPARTMENT_OTHER): Payer: Self-pay | Admitting: *Deleted

## 2018-07-31 LAB — CALCIUM, IONIZED: Calcium, Ion: 5.7 mg/dL — ABNORMAL HIGH (ref 4.8–5.6)

## 2018-07-31 LAB — VITAMIN D 1,25 DIHYDROXY
VITAMIN D 1, 25 (OH) TOTAL: 27 pg/mL (ref 18–72)
VITAMIN D3 1, 25 (OH): 27 pg/mL
Vitamin D2 1, 25 (OH)2: <8 pg/mL

## 2018-07-31 LAB — PTH, INTACT AND CALCIUM
Calcium: 10.3 mg/dL (ref 8.6–10.4)
PTH: 28 pg/mL (ref 14–64)

## 2018-08-02 NOTE — Progress Notes (Signed)
Ensure Pre-surgery drink given to patient with instructions to complete by 0415 DOS.  Pt verbalized understanding.

## 2018-08-03 DIAGNOSIS — M25661 Stiffness of right knee, not elsewhere classified: Secondary | ICD-10-CM | POA: Diagnosis not present

## 2018-08-03 DIAGNOSIS — Z96651 Presence of right artificial knee joint: Secondary | ICD-10-CM | POA: Diagnosis not present

## 2018-08-03 DIAGNOSIS — Z471 Aftercare following joint replacement surgery: Secondary | ICD-10-CM | POA: Diagnosis not present

## 2018-08-04 ENCOUNTER — Encounter (HOSPITAL_BASED_OUTPATIENT_CLINIC_OR_DEPARTMENT_OTHER): Payer: Self-pay | Admitting: *Deleted

## 2018-08-04 ENCOUNTER — Ambulatory Visit (HOSPITAL_BASED_OUTPATIENT_CLINIC_OR_DEPARTMENT_OTHER): Payer: 59 | Admitting: Anesthesiology

## 2018-08-04 ENCOUNTER — Encounter (HOSPITAL_BASED_OUTPATIENT_CLINIC_OR_DEPARTMENT_OTHER)
Admission: RE | Disposition: A | Discharge status: Home / Self Care | Payer: Self-pay | Source: Ambulatory Visit | Attending: Surgery

## 2018-08-04 ENCOUNTER — Ambulatory Visit (HOSPITAL_BASED_OUTPATIENT_CLINIC_OR_DEPARTMENT_OTHER)
Admission: RE | Admit: 2018-08-04 | Discharge: 2018-08-04 | Disposition: A | Discharge status: Home / Self Care | Payer: 59 | Source: Ambulatory Visit | Attending: Surgery | Admitting: Surgery

## 2018-08-04 ENCOUNTER — Other Ambulatory Visit: Payer: Self-pay

## 2018-08-04 DIAGNOSIS — Z96651 Presence of right artificial knee joint: Secondary | ICD-10-CM | POA: Insufficient documentation

## 2018-08-04 DIAGNOSIS — Z79899 Other long term (current) drug therapy: Secondary | ICD-10-CM | POA: Diagnosis not present

## 2018-08-04 DIAGNOSIS — E7800 Pure hypercholesterolemia, unspecified: Secondary | ICD-10-CM | POA: Insufficient documentation

## 2018-08-04 DIAGNOSIS — T8189XA Other complications of procedures, not elsewhere classified, initial encounter: Secondary | ICD-10-CM | POA: Insufficient documentation

## 2018-08-04 DIAGNOSIS — I7300 Raynaud's syndrome without gangrene: Secondary | ICD-10-CM | POA: Diagnosis not present

## 2018-08-04 DIAGNOSIS — Y838 Other surgical procedures as the cause of abnormal reaction of the patient, or of later complication, without mention of misadventure at the time of the procedure: Secondary | ICD-10-CM | POA: Insufficient documentation

## 2018-08-04 DIAGNOSIS — K589 Irritable bowel syndrome without diarrhea: Secondary | ICD-10-CM | POA: Diagnosis not present

## 2018-08-04 DIAGNOSIS — M795 Residual foreign body in soft tissue: Secondary | ICD-10-CM | POA: Diagnosis not present

## 2018-08-04 DIAGNOSIS — E785 Hyperlipidemia, unspecified: Secondary | ICD-10-CM | POA: Diagnosis not present

## 2018-08-04 DIAGNOSIS — Z87891 Personal history of nicotine dependence: Secondary | ICD-10-CM | POA: Diagnosis not present

## 2018-08-04 DIAGNOSIS — Z4802 Encounter for removal of sutures: Secondary | ICD-10-CM | POA: Diagnosis not present

## 2018-08-04 HISTORY — PX: MASS EXCISION: SHX2000

## 2018-08-04 SURGERY — EXCISION MASS
Anesthesia: Monitor Anesthesia Care | Site: Abdomen

## 2018-08-04 MED ORDER — HEPARIN (PORCINE) IN NACL 1000-0.9 UT/500ML-% IV SOLN
INTRAVENOUS | Status: AC
  Filled 2018-08-04: qty 500

## 2018-08-04 MED ORDER — METHYLENE BLUE 0.5 % INJ SOLN
INTRAVENOUS | Status: AC
  Filled 2018-08-04: qty 10

## 2018-08-04 MED ORDER — ONDANSETRON HCL 4 MG/2ML IJ SOLN
INTRAMUSCULAR | Status: DC | PRN
  Administered 2018-08-04: 4 mg via INTRAVENOUS

## 2018-08-04 MED ORDER — FENTANYL CITRATE (PF) 100 MCG/2ML IJ SOLN: 25.0000 ug | INTRAMUSCULAR | Status: DC | PRN

## 2018-08-04 MED ORDER — LIDOCAINE HCL (PF) 1 % IJ SOLN
INTRAMUSCULAR | Status: AC
  Filled 2018-08-04: qty 60

## 2018-08-04 MED ORDER — LACTATED RINGERS IV SOLN
INTRAVENOUS | Status: DC
  Administered 2018-08-04: 07:00:00 via INTRAVENOUS

## 2018-08-04 MED ORDER — GABAPENTIN 300 MG PO CAPS
ORAL_CAPSULE | ORAL | Status: AC
  Filled 2018-08-04: qty 1

## 2018-08-04 MED ORDER — PROPOFOL 500 MG/50ML IV EMUL
INTRAVENOUS | Status: DC | PRN
  Administered 2018-08-04: 25 ug/kg/min via INTRAVENOUS

## 2018-08-04 MED ORDER — ACETAMINOPHEN 500 MG PO TABS
1000.0000 mg | ORAL_TABLET | ORAL | Status: AC
  Administered 2018-08-04: 1000 mg via ORAL

## 2018-08-04 MED ORDER — ACETAMINOPHEN 10 MG/ML IV SOLN: 1000.0000 mg | Freq: Once | INTRAVENOUS | Status: DC | PRN

## 2018-08-04 MED ORDER — CEFAZOLIN SODIUM-DEXTROSE 2-4 GM/100ML-% IV SOLN
2.0000 g | INTRAVENOUS | Status: AC
  Administered 2018-08-04: 2 g via INTRAVENOUS

## 2018-08-04 MED ORDER — GABAPENTIN 300 MG PO CAPS
300.0000 mg | ORAL_CAPSULE | ORAL | Status: AC
  Administered 2018-08-04: 300 mg via ORAL

## 2018-08-04 MED ORDER — OXYCODONE HCL 5 MG PO TABS: 5.0000 mg | ORAL_TABLET | Freq: Four times a day (QID) | ORAL | 0 refills | Status: DC | PRN

## 2018-08-04 MED ORDER — CEFAZOLIN SODIUM-DEXTROSE 2-4 GM/100ML-% IV SOLN
INTRAVENOUS | Status: AC
  Filled 2018-08-04: qty 100

## 2018-08-04 MED ORDER — CELECOXIB 200 MG PO CAPS
ORAL_CAPSULE | ORAL | Status: AC
  Filled 2018-08-04: qty 1

## 2018-08-04 MED ORDER — DEXAMETHASONE SODIUM PHOSPHATE 10 MG/ML IJ SOLN
INTRAMUSCULAR | Status: DC | PRN
  Administered 2018-08-04: 10 mg via INTRAVENOUS

## 2018-08-04 MED ORDER — MIDAZOLAM HCL 2 MG/2ML IJ SOLN
INTRAMUSCULAR | Status: AC
  Filled 2018-08-04: qty 2

## 2018-08-04 MED ORDER — ACETAMINOPHEN 500 MG PO TABS: 1000.0000 mg | ORAL_TABLET | Freq: Once | ORAL | Status: DC | PRN

## 2018-08-04 MED ORDER — CHLORHEXIDINE GLUCONATE CLOTH 2 % EX PADS: 6.0000 | MEDICATED_PAD | Freq: Once | CUTANEOUS | Status: DC

## 2018-08-04 MED ORDER — OXYCODONE HCL 5 MG PO TABS: 5.0000 mg | ORAL_TABLET | Freq: Once | ORAL | Status: DC | PRN

## 2018-08-04 MED ORDER — IBUPROFEN 800 MG PO TABS: 800.0000 mg | ORAL_TABLET | Freq: Three times a day (TID) | ORAL | 0 refills | Status: DC | PRN

## 2018-08-04 MED ORDER — LIDOCAINE 2% (20 MG/ML) 5 ML SYRINGE
INTRAMUSCULAR | Status: DC | PRN
  Administered 2018-08-04: 60 mg via INTRAVENOUS

## 2018-08-04 MED ORDER — FENTANYL CITRATE (PF) 100 MCG/2ML IJ SOLN
INTRAMUSCULAR | Status: AC
  Filled 2018-08-04: qty 2

## 2018-08-04 MED ORDER — ACETAMINOPHEN 500 MG PO TABS
ORAL_TABLET | ORAL | Status: AC
  Filled 2018-08-04: qty 2

## 2018-08-04 MED ORDER — BUPIVACAINE-EPINEPHRINE 0.25% -1:200000 IJ SOLN
INTRAMUSCULAR | Status: DC | PRN
  Administered 2018-08-04: 5 mL

## 2018-08-04 MED ORDER — CELECOXIB 200 MG PO CAPS
200.0000 mg | ORAL_CAPSULE | ORAL | Status: AC
  Administered 2018-08-04: 200 mg via ORAL

## 2018-08-04 MED ORDER — OXYCODONE HCL 5 MG/5ML PO SOLN: 5.0000 mg | Freq: Once | ORAL | Status: DC | PRN

## 2018-08-04 MED ORDER — BUPIVACAINE HCL (PF) 0.5 % IJ SOLN
INTRAMUSCULAR | Status: AC
  Filled 2018-08-04: qty 30

## 2018-08-04 MED ORDER — ACETAMINOPHEN 160 MG/5ML PO SOLN: 1000.0000 mg | Freq: Once | ORAL | Status: DC | PRN

## 2018-08-04 MED ORDER — MIDAZOLAM HCL 2 MG/2ML IJ SOLN
1.0000 mg | INTRAMUSCULAR | Status: DC | PRN
  Administered 2018-08-04: 2 mg via INTRAVENOUS

## 2018-08-04 MED ORDER — SCOPOLAMINE 1 MG/3DAYS TD PT72: 1.0000 | MEDICATED_PATCH | Freq: Once | TRANSDERMAL | Status: DC | PRN

## 2018-08-04 MED ORDER — SODIUM CHLORIDE 0.9 % IJ SOLN
INTRAMUSCULAR | Status: AC
  Filled 2018-08-04: qty 10

## 2018-08-04 MED ORDER — LIDOCAINE-EPINEPHRINE (PF) 1 %-1:200000 IJ SOLN
INTRAMUSCULAR | Status: AC
  Filled 2018-08-04: qty 30

## 2018-08-04 MED ORDER — FENTANYL CITRATE (PF) 100 MCG/2ML IJ SOLN
50.0000 ug | INTRAMUSCULAR | Status: DC | PRN
  Administered 2018-08-04 (×2): 50 ug via INTRAVENOUS

## 2018-08-04 MED ORDER — BUPIVACAINE-EPINEPHRINE (PF) 0.25% -1:200000 IJ SOLN
INTRAMUSCULAR | Status: AC
  Filled 2018-08-04: qty 60

## 2018-08-04 MED ORDER — HEPARIN SOD (PORK) LOCK FLUSH 100 UNIT/ML IV SOLN
INTRAVENOUS | Status: AC
  Filled 2018-08-04: qty 5

## 2018-08-04 SURGICAL SUPPLY — 45 items
ADH SKN CLS APL DERMABOND .7 (GAUZE/BANDAGES/DRESSINGS) ×1
APL SKNCLS STERI-STRIP NONHPOA (GAUZE/BANDAGES/DRESSINGS)
BENZOIN TINCTURE PRP APPL 2/3 (GAUZE/BANDAGES/DRESSINGS) IMPLANT
BLADE SURG 10 STRL SS (BLADE) IMPLANT
BLADE SURG 15 STRL LF DISP TIS (BLADE) ×1 IMPLANT
BLADE SURG 15 STRL SS (BLADE) ×2
CANISTER SUCT 1200ML W/VALVE (MISCELLANEOUS) IMPLANT
CHLORAPREP W/TINT 26ML (MISCELLANEOUS) ×2 IMPLANT
COVER BACK TABLE 60X90IN (DRAPES) ×2 IMPLANT
COVER MAYO STAND STRL (DRAPES) ×2 IMPLANT
DECANTER SPIKE VIAL GLASS SM (MISCELLANEOUS) IMPLANT
DERMABOND ADVANCED (GAUZE/BANDAGES/DRESSINGS) ×1
DERMABOND ADVANCED .7 DNX12 (GAUZE/BANDAGES/DRESSINGS) IMPLANT
DRAPE LAPAROTOMY 100X72 PEDS (DRAPES) ×2 IMPLANT
DRAPE UTILITY XL STRL (DRAPES) ×2 IMPLANT
ELECT COATED BLADE 2.86 ST (ELECTRODE) ×2 IMPLANT
ELECT REM PT RETURN 9FT ADLT (ELECTROSURGICAL) ×2
ELECTRODE REM PT RTRN 9FT ADLT (ELECTROSURGICAL) ×1 IMPLANT
GLOVE BIOGEL PI IND STRL 6.5 (GLOVE) IMPLANT
GLOVE BIOGEL PI IND STRL 7.0 (GLOVE) IMPLANT
GLOVE BIOGEL PI IND STRL 8 (GLOVE) ×1 IMPLANT
GLOVE BIOGEL PI INDICATOR 6.5 (GLOVE) ×1
GLOVE BIOGEL PI INDICATOR 7.0 (GLOVE) ×1
GLOVE BIOGEL PI INDICATOR 8 (GLOVE) ×1
GLOVE ECLIPSE 8.0 STRL XLNG CF (GLOVE) ×2 IMPLANT
GLOVE SURG SS PI 6.5 STRL IVOR (GLOVE) ×1 IMPLANT
GOWN STRL REUS W/ TWL LRG LVL3 (GOWN DISPOSABLE) ×2 IMPLANT
GOWN STRL REUS W/TWL LRG LVL3 (GOWN DISPOSABLE) ×4
NDL HYPO 25X1 1.5 SAFETY (NEEDLE) ×1 IMPLANT
NEEDLE HYPO 25X1 1.5 SAFETY (NEEDLE) ×2 IMPLANT
NS IRRIG 1000ML POUR BTL (IV SOLUTION) IMPLANT
PACK BASIN DAY SURGERY FS (CUSTOM PROCEDURE TRAY) ×2 IMPLANT
PENCIL BUTTON HOLSTER BLD 10FT (ELECTRODE) ×2 IMPLANT
SLEEVE SCD COMPRESS KNEE MED (MISCELLANEOUS) ×1 IMPLANT
SPONGE LAP 4X18 RFD (DISPOSABLE) IMPLANT
STAPLER VISISTAT 35W (STAPLE) IMPLANT
STRIP CLOSURE SKIN 1/2X4 (GAUZE/BANDAGES/DRESSINGS) IMPLANT
SUT MON AB 4-0 PC3 18 (SUTURE) ×2 IMPLANT
SUT VICRYL 3-0 CR8 SH (SUTURE) ×2 IMPLANT
SUT VICRYL AB 3 0 TIES (SUTURE) IMPLANT
SYR CONTROL 10ML LL (SYRINGE) ×2 IMPLANT
TOWEL GREEN STERILE FF (TOWEL DISPOSABLE) ×3 IMPLANT
TOWEL OR NON WOVEN STRL DISP B (DISPOSABLE) ×1 IMPLANT
TUBE CONNECTING 20X1/4 (TUBING) IMPLANT
YANKAUER SUCT BULB TIP NO VENT (SUCTIONS) IMPLANT

## 2018-08-04 NOTE — H&P (Signed)
Bethany [name] is an 62 y.o. female.   Chief Complaint: Stitch granuloma HPI: The patient is 2 months out from exporter laparotomy for small bowel obstruction.  She had significant pain due to her thin abdominal wall secondary to the stitch.  This is causing discomfort and now she has a stitch granuloma.  She is here today for excision of that.  Past Medical History:  Diagnosis Date  . Anxiety   . Arthritis   . Cervical dysplasia 1990   CIN 2 subsequent cryosurgery. All Paps normal afterwards  . Dyslipidemia   . Herpes genitalis   . Herpes labialis   . Hypercholesteremia   . IBS (irritable bowel syndrome)   . Osteopenia 04/2014   T score -1.7 FRAX 7.3%/0.8% stable from prior DEXA 2013  . Raynaud disease     Past Surgical History:  Procedure Laterality Date  . AUGMENTATION MAMMAPLASTY    . BREAST ENHANCEMENT SURGERY  2006  . COSMETIC SURGERY    . EYE SURGERY     lasik od  . GYNECOLOGIC CRYOSURGERY  1990  . LAPAROTOMY N/A 04/22/2018   Procedure: EXPLORATORY LAPAROTOMY;  Surgeon: Erroll Luna, MD;  Location: [facility];  Service: General;  Laterality: N/A;  . LIGAMENT REPAIR Right 09/24/2015   Procedure: RIGHT INDEX METACARPAL PHALANGEAL RADIAL COLLATERAL LIGAMENT REPAIR;  Surgeon: Leanora Cover, MD;  Location: [facility];  Service: Orthopedics;  Laterality: Right;  . OOPHORECTOMY     BSO  . Right knee arthoscopy Right 09/09/2017   [facility]  . TONSILLECTOMY AND ADENOIDECTOMY    . TOTAL KNEE ARTHROPLASTY Right 04/12/2018  . TOTAL KNEE ARTHROPLASTY Right 04/12/2018   Procedure: RIGHT TOTAL KNEE ARTHROPLASTY;  Surgeon: Melrose Nakayama, MD;  Location: [facility];  Service: Orthopedics;  Laterality: Right;  . TUBAL LIGATION    . UPPER GASTROINTESTINAL ENDOSCOPY  04/24/05  . VAGINAL HYSTERECTOMY  2005   TVH BSO  adenomyosis    Family History  Problem Relation Age of Onset  . Hypertension Mother   . Cancer Father 40       Lymphoma  . Breast cancer Sister 103  .  Colon cancer Neg Hx    Social History:  reports that she quit smoking about 12 years ago. She has never used smokeless tobacco. She reports that she does not drink alcohol or use drugs.  Allergies:  Allergies  Allergen Reactions  . Morphine And Related Nausea Only    Medications Prior to Admission  Medication Sig Dispense Refill  . b complex vitamins tablet Take 1 tablet by mouth daily.    . bisacodyl (DULCOLAX) 5 MG EC tablet Take 1 tablet (5 mg total) by mouth daily as needed for moderate constipation. 15 tablet 0  . Carboxymethylcellulose Sodium (THERATEARS) 0.25 % SOLN Place 1 drop into both eyes 2 (two) times daily as needed (dry eyes).    . cholecalciferol (VITAMIN D) 1000 UNITS tablet Take 1,000 Units by mouth daily.    Marland Kitchen docusate sodium (COLACE) 100 MG capsule Take 1 capsule (100 mg total) by mouth 2 (two) times daily. 30 capsule 0  . gabapentin (NEURONTIN) 600 MG tablet Take 1,200 mg by mouth 2 (two) times daily.     Marland Kitchen lamoTRIgine (LAMICTAL) 100 MG tablet Take 100 mg by mouth 2 (two) times daily.   0  . traMADol (ULTRAM) 50 MG tablet Take 50 mg by mouth every 6 (six) hours as needed.    . vitamin C (ASCORBIC ACID) 500 MG tablet Take  500 mg by mouth daily.    . montelukast (SINGULAIR) 10 MG tablet Take 1 tablet (10 mg total) by mouth at bedtime. 30 tablet 3  . PROCTOZONE-HC 2.5 % rectal cream apply rectally twice a day (Patient taking differently: Apply rectally twice a day as needed for irritations) 30 g 1  . simvastatin (ZOCOR) 20 MG tablet Take 1 tablet (20 mg total) by mouth daily. 90 tablet 3    No results found for this or any previous visit (from the past 48 hour(s)). No results found.  Review of Systems  All other systems reviewed and are negative.   Height 5\' 5"  (1.651 m), weight 52.6 kg. Physical Exam  Constitutional: She appears well-developed.  HENT:  Head: Normocephalic.  Eyes: Pupils are equal, round, and reactive to light.  Neck: Normal range of motion.    Cardiovascular: Normal rate.  Respiratory: Effort normal.  GI:    Musculoskeletal: Normal range of motion.  Neurological: She is alert.     Assessment/Plan Stitch granuloma  Excised today in operating room..The procedure has been discussed with the patient.  Alternative therapies have been discussed with the patient.  Operative risks include bleeding,  Infection,  Organ injury,  Nerve injury,  Blood vessel injury,  DVT,  Pulmonary embolism,  Death,  And possible reoperation.  Medical management risks include worsening of present situation.  The success of the procedure is 50 -90 % at treating patients symptoms.  The patient understands and agrees to proceed.  Turner Daniels, MD 08/04/2018, 6:33 AM

## 2018-08-04 NOTE — Discharge Instructions (Signed)
Ok to shower  No restrictions  Follow up 3 weeks  Glue will fall off in 2 weeks     Post Anesthesia Home Care Instructions  Activity: Get plenty of rest for the remainder of the day. A responsible individual must stay with you for 24 hours following the procedure.  For the next 24 hours, DO NOT: -Drive a car -Paediatric nurse -Drink alcoholic beverages -Take any medication unless instructed by your physician -Make any legal decisions or sign important papers.  Meals: Start with liquid foods such as gelatin or soup. Progress to regular foods as tolerated. Avoid greasy, spicy, heavy foods. If nausea and/or vomiting occur, drink only clear liquids until the nausea and/or vomiting subsides. Call your physician if vomiting continues.  Special Instructions/Symptoms: Your throat may feel dry or sore from the anesthesia or the breathing tube placed in your throat during surgery. If this causes discomfort, gargle with warm salt water. The discomfort should disappear within 24 hours.  If you had a scopolamine patch placed behind your ear for the management of post- operative nausea and/or vomiting:  1. The medication in the patch is effective for 72 hours, after which it should be removed.  Wrap patch in a tissue and discard in the trash. Wash hands thoroughly with soap and water. 2. You may remove the patch earlier than 72 hours if you experience unpleasant side effects which may include dry mouth, dizziness or visual disturbances. 3. Avoid touching the patch. Wash your hands with soap and water after contact with the patch.

## 2018-08-04 NOTE — Anesthesia Preprocedure Evaluation (Signed)
Anesthesia Evaluation  Patient identified by MRN, date of birth, ID band Patient awake    Reviewed: Allergy & Precautions, NPO status , Patient's Chart, lab work & pertinent test results  History of Anesthesia Complications Negative for: history of anesthetic complications  Airway Mallampati: II  TM Distance: >3 FB Neck ROM: Full    Dental  (+) Teeth Intact   Pulmonary former smoker,    breath sounds clear to auscultation       Cardiovascular negative cardio ROS   Rhythm:Regular Rate:Normal     Neuro/Psych PSYCHIATRIC DISORDERS Anxiety negative neurological ROS     GI/Hepatic negative GI ROS, Neg liver ROS,   Endo/Other  negative endocrine ROS  Renal/GU negative Renal ROS     Musculoskeletal  (+) Arthritis ,   Abdominal   Peds  Hematology negative hematology ROS (+)   Anesthesia Other Findings   Reproductive/Obstetrics                             Anesthesia Physical Anesthesia Plan  ASA: II  Anesthesia Plan: MAC   Post-op Pain Management:    Induction: Intravenous  PONV Risk Score and Plan: 2 and Ondansetron  Airway Management Planned: Nasal Cannula  Additional Equipment: None  Intra-op Plan:   Post-operative Plan:   Informed Consent: I have reviewed the patients History and Physical, chart, labs and discussed the procedure including the risks, benefits and alternatives for the proposed anesthesia with the patient or authorized representative who has indicated his/her understanding and acceptance.   Dental advisory given  Plan Discussed with: CRNA and Surgeon  Anesthesia Plan Comments:         Anesthesia Quick Evaluation

## 2018-08-04 NOTE — Interval H&P Note (Signed)
History and Physical Interval Note:  08/04/2018 7:19 AM  Bethany [name]  has presented today for surgery, with the diagnosis of SUTURE  The various methods of treatment have been discussed with the patient and family. After consideration of risks, benefits and other options for treatment, the patient has consented to  Procedure(s): REMOVAL OF SUTURE ABDOMEN/ERAS PATHWAY (N/A) as a surgical intervention .  The patient's history has been reviewed, patient examined, no change in status, stable for surgery.  I have reviewed the patient's chart and labs.  Questions were answered to the patient's satisfaction.     [place]

## 2018-08-04 NOTE — Transfer of Care (Signed)
Immediate Anesthesia Transfer of Care Note  Patient: Bethany [name]  Procedure(s) Performed: REMOVAL OF SUTURE FROM ABDOMEN/ERAS PATHWAY (N/A Abdomen)  Patient Location: PACU  Anesthesia Type:MAC  Level of Consciousness: awake  Airway & Oxygen Therapy: Patient Spontanous Breathing and Patient connected to face mask oxygen  Post-op Assessment: Report given to RN and Post -op Vital signs reviewed and stable  Post vital signs: Reviewed and stable  Last Vitals:  Vitals Value Taken Time  BP    Temp    Pulse    Resp 15 08/04/2018  8:04 AM  SpO2    Vitals shown include unvalidated device data.  Last Pain:  Vitals:   08/04/18 0644  TempSrc:   PainSc: 0-No pain         Complications: No apparent anesthesia complications

## 2018-08-04 NOTE — Anesthesia Postprocedure Evaluation (Signed)
Anesthesia Post Note  Patient: Bethany [name]  Procedure(s) Performed: REMOVAL OF SUTURE FROM ABDOMEN/ERAS PATHWAY (N/A Abdomen)     Patient location during evaluation: PACU Anesthesia Type: MAC Level of consciousness: awake and alert Pain management: pain level controlled Vital Signs Assessment: post-procedure vital signs reviewed and stable Respiratory status: spontaneous breathing, nonlabored ventilation, respiratory function stable and patient connected to nasal cannula oxygen Cardiovascular status: stable and blood pressure returned to baseline Postop Assessment: no apparent nausea or vomiting Anesthetic complications: no    Last Vitals:  Vitals:   08/04/18 0830 08/04/18 0843  BP: 121/63 (!) 131/55  Pulse: 68 72  Resp: 10 18  Temp:  36.6 C  SpO2: 99% 100%    Last Pain:  Vitals:   08/04/18 0843  TempSrc:   PainSc: 5                  [name]

## 2018-08-04 NOTE — Op Note (Signed)
Preoperative diagnosis: Stitch granuloma  Postoperative diagnosis: Same  Procedure: Removal of abdominal stitch granuloma  Surgeon: Erroll Luna, MD  Anesthesia: MAC with local  EBL: Minimal  Specimen: None  IV fluids: Per anesthesia record  Indications for procedure: The patient is a 62 year old female 2 months out from exporter laparotomy for small bowel obstruction.  She has a persistent stitch granuloma at her umbilicus from closure.  This is not improving with time and she desires excision for pain control and due to some oozing from the site.  Risk, benefits and other options including observation discussed with the patient.  She was to proceed with surgical removal.The procedure has been discussed with the patient.  Alternative therapies have been discussed with the patient.  Operative risks include bleeding,  Infection,  Organ injury,  Nerve injury,  Blood vessel injury,  DVT,  Pulmonary embolism,  Death,  And possible reoperation.  Medical management risks include worsening of present situation.  The success of the procedure is 50 -90 % at treating patients symptoms.  The patient understands and agrees to proceed.   Description of procedure: The patient was met in the holding area and the procedure was reviewed with her and her husband.  Questions were answered.  She was taken back to the operating room placed supine upon the operating table.  After induction of MAC anesthesia the previous abdominal incision area was prepped and draped in sterile fashion.  Timeout was done.  She received appropriate preoperative antibiotics.  The stitch granuloma is just below the umbilicus.  The suture had protruded through the skin given the fact that she was extremely thin.  I was able to grasp this and make a small incision around it.  There is a significant amount of granulation tissue around but not I was able to debride and remove the knot and the granuloma that was surrounding it back to healthy  tissue.  There is no signs of any infection.  The skin was then reapproximated with 4-0 Monocryl.  Dermabond was applied.  All final counts were found to be correct.  The patient was awoke taken to recovery in satisfactory condition.

## 2018-08-05 ENCOUNTER — Encounter (HOSPITAL_BASED_OUTPATIENT_CLINIC_OR_DEPARTMENT_OTHER): Payer: Self-pay | Admitting: Surgery

## 2018-08-12 DIAGNOSIS — Z85828 Personal history of other malignant neoplasm of skin: Secondary | ICD-10-CM | POA: Diagnosis not present

## 2018-08-12 DIAGNOSIS — L821 Other seborrheic keratosis: Secondary | ICD-10-CM | POA: Diagnosis not present

## 2018-08-12 DIAGNOSIS — L570 Actinic keratosis: Secondary | ICD-10-CM | POA: Diagnosis not present

## 2018-08-12 DIAGNOSIS — D225 Melanocytic nevi of trunk: Secondary | ICD-10-CM | POA: Diagnosis not present

## 2018-08-15 DIAGNOSIS — Z96651 Presence of right artificial knee joint: Secondary | ICD-10-CM | POA: Diagnosis not present

## 2018-08-15 DIAGNOSIS — Z471 Aftercare following joint replacement surgery: Secondary | ICD-10-CM | POA: Diagnosis not present

## 2018-08-15 DIAGNOSIS — M25661 Stiffness of right knee, not elsewhere classified: Secondary | ICD-10-CM | POA: Diagnosis not present

## 2018-09-13 ENCOUNTER — Encounter: Payer: Self-pay | Admitting: Family Medicine

## 2018-09-13 ENCOUNTER — Ambulatory Visit (INDEPENDENT_AMBULATORY_CARE_PROVIDER_SITE_OTHER): Payer: 59 | Admitting: Family Medicine

## 2018-09-13 VITALS — BP 112/78 | HR 79 | Temp 97.6°F | Ht 65.0 in | Wt 116.6 lb

## 2018-09-13 DIAGNOSIS — D126 Benign neoplasm of colon, unspecified: Secondary | ICD-10-CM

## 2018-09-13 DIAGNOSIS — K589 Irritable bowel syndrome without diarrhea: Secondary | ICD-10-CM

## 2018-09-13 DIAGNOSIS — E785 Hyperlipidemia, unspecified: Secondary | ICD-10-CM

## 2018-09-13 DIAGNOSIS — M999 Biomechanical lesion, unspecified: Secondary | ICD-10-CM | POA: Diagnosis not present

## 2018-09-13 DIAGNOSIS — Z23 Encounter for immunization: Secondary | ICD-10-CM | POA: Diagnosis not present

## 2018-09-13 DIAGNOSIS — Z860100 Personal history of colon polyps, unspecified: Secondary | ICD-10-CM

## 2018-09-13 DIAGNOSIS — F909 Attention-deficit hyperactivity disorder, unspecified type: Secondary | ICD-10-CM | POA: Insufficient documentation

## 2018-09-13 DIAGNOSIS — F988 Other specified behavioral and emotional disorders with onset usually occurring in childhood and adolescence: Secondary | ICD-10-CM

## 2018-09-13 DIAGNOSIS — F39 Unspecified mood [affective] disorder: Secondary | ICD-10-CM | POA: Diagnosis not present

## 2018-09-13 DIAGNOSIS — T8484XA Pain due to internal orthopedic prosthetic devices, implants and grafts, initial encounter: Secondary | ICD-10-CM

## 2018-09-13 DIAGNOSIS — Z0000 Encounter for general adult medical examination without abnormal findings: Secondary | ICD-10-CM

## 2018-09-13 DIAGNOSIS — Z96659 Presence of unspecified artificial knee joint: Secondary | ICD-10-CM

## 2018-09-13 DIAGNOSIS — I7300 Raynaud's syndrome without gangrene: Secondary | ICD-10-CM

## 2018-09-13 HISTORY — DX: Attention-deficit hyperactivity disorder, unspecified type: F90.9

## 2018-09-13 LAB — POCT URINALYSIS DIP (PROADVANTAGE DEVICE)
Bilirubin, UA: NEGATIVE
Glucose, UA: NEGATIVE mg/dL
Ketones, POC UA: NEGATIVE mg/dL
Leukocytes, UA: NEGATIVE
NITRITE UA: NEGATIVE
PH UA: 6 (ref 5.0–8.0)
Protein Ur, POC: NEGATIVE mg/dL
RBC UA: NEGATIVE
Specific Gravity, Urine: 1.03
UUROB: 3.5

## 2018-09-13 LAB — LIPID PANEL
CHOL/HDL RATIO: 2.2 ratio (ref 0.0–4.4)
Cholesterol, Total: 199 mg/dL (ref 100–199)
HDL: 91 mg/dL (ref >39)
LDL Calculated: 98 mg/dL (ref 0–99)
Triglycerides: 52 mg/dL (ref 0–149)
VLDL Cholesterol Cal: 10 mg/dL (ref 5–40)

## 2018-09-13 MED ORDER — IBUPROFEN 800 MG PO TABS: 800.0000 mg | ORAL_TABLET | Freq: Three times a day (TID) | ORAL | 1 refills | Status: DC | PRN

## 2018-09-13 NOTE — Progress Notes (Signed)
Subjective:    Patient ID: Bethany [name], female    DOB: [birth year], 62 y.o.   MRN: [id number]  HPI She is here for complete examination.  She has had a total knee replacement on the right.  After that she did get hospitalized again and required surgery for small bowel obstruction.  That also interfered with her knee rehab.  She is now behind in terms of getting her range of motion and strength back in her knee.  She would like a refill on her Motrin. She remains alcohol free and is under the care of Dr. Edgar Frisk who also diagnosed her with ADD.  She is not interested in going on any medications however.  Right now she plans on hanging with this non-pill wise. She has had no more difficulty with back pain.  Her Raynaud's does give her occasional difficulty but she knows how to handle that.  She continues on supplements and is having no difficulty with that.  She also also taking Zocor.  She is now taking Singulair to help with her knee rehab.  Apparently this medication can help with mental allergies. She has had a colonoscopy and is scheduled for repeat in 2022. Her work and home life are going well.  Otherwise her family, social history, health maintenance and immunizations was also reviewed. Review of Systems  All other systems reviewed and are negative.      Objective:   Physical Exam BP 112/78 (BP Location: Left Arm, Patient Position: Sitting)   Pulse 79   Temp 97.6 F (36.4 C)   Ht 5\' 5"  (1.651 m)   Wt 116 lb 9.6 oz (52.9 kg)   SpO2 94%   BMI 19.40 kg/m   General Appearance:    Alert, cooperative, no distress, appears stated age  Head:    Normocephalic, without obvious abnormality, atraumatic  Eyes:    PERRL, conjunctiva/corneas clear, EOM's intact, fundi    benign  Ears:    Normal TM's and external ear canals  Nose:   Nares normal, mucosa normal, no drainage or sinus   tenderness  Throat:   Lips, mucosa, and tongue normal; teeth and gums normal  Neck:   Supple, no  lymphadenopathy;  thyroid:  no   enlargement/tenderness/nodules; no carotid   bruit or JVD  Back:    Spine nontender, no curvature, ROM normal, no CVA     tenderness  Lungs:     Clear to auscultation bilaterally without wheezes, rales or     ronchi; respirations unlabored  Chest Wall:    No tenderness or deformity   Heart:    Regular rate and rhythm, S1 and S2 normal, no murmur, rub   or gallop  Breast Exam:    Deferred to GYN  Abdomen:     Soft, non-tender, nondistended, normoactive bowel sounds,    no masses, no hepatosplenomegaly  Genitalia:    Deferred to GYN     Extremities:   No clubbing, cyanosis or edema  Pulses:   2+ and symmetric all extremities  Skin:   Skin color, texture, turgor normal, no rashes or lesions  Lymph nodes:   Cervical, supraclavicular, and axillary nodes normal  Neurologic:   CNII-XII intact, normal strength, sensation and gait; reflexes 2+ and symmetric throughout          Psych:   Normal mood, affect, hygiene and grooming.          Assessment & Plan:  Routine general medical examination at a health care  facility - Plan: POCT Urinalysis DIP (Proadvantage Device)  Mood disorder (HCC)  Irritable bowel syndrome, unspecified type  Nonallopathic lesion of lumbosacral region  Painful total knee replacement, initial encounter ([place]) - Plan: ibuprofen (ADVIL,MOTRIN) 800 MG tablet  Hyperlipidemia with target LDL less than 100 - Plan: Lipid panel  Raynaud's disease without gangrene  Need for Tdap vaccination - Plan: Tdap vaccine greater than or equal to 7yo IM  Need for shingles vaccine - Plan: Varicella-zoster vaccine IM  Attention deficit disorder, unspecified hyperactivity presence  History of colonic polyps - Adenomatous  Tubular adenoma of colon Strongly encouraged her to continue to work very diligently concerning her knee rehab.  Continue on all of her other medications.

## 2018-09-19 ENCOUNTER — Encounter: Payer: Self-pay | Admitting: Family Medicine

## 2018-09-19 MED ORDER — VALACYCLOVIR HCL 1 G PO TABS: 1000.0000 mg | ORAL_TABLET | Freq: Two times a day (BID) | ORAL | 0 refills | Status: DC

## 2018-09-19 NOTE — Progress Notes (Signed)
[name] [address] [place], [place] [address] Phone: [phone] Subjective:   Bethany [name], am serving as a scribe for Dr. Hulan Saas.    CC: Knee pain  [id number]  Bethany [name] is a 62 y.o. female coming in with complaint of knee pain. She states that her pain is about the same. In constant pain since last visit. Descending stairs, getting down to the floor and deep squats bother her knee. She is doing the elliptical and notices constant discomfort.  Patient did have a replacement and did have a manipulation under anesthesia in June 2019.  Has done the physical therapy again.  Continues to attempt to work out.  Still has some instability and loss of range of motion.  Patient states that she has a chronic dull aching pain that seems to be diffusely around the knee.  Denies any fevers or chills.  Last follow-up was found to have some scar tissue formation and very small amount of effusion.  Has been on Singulair for potential metal allergy but did not make significant improvement.      Past Medical History:  Diagnosis Date  . Anxiety   . Arthritis   . Cervical dysplasia 1990   CIN 2 subsequent cryosurgery. All Paps normal afterwards  . Dyslipidemia   . Herpes genitalis   . Herpes labialis   . Hypercholesteremia   . IBS (irritable bowel syndrome)   . Osteopenia 04/2014   T score -1.7 FRAX 7.3%/0.8% stable from prior DEXA 2013  . Raynaud disease    Past Surgical History:  Procedure Laterality Date  . AUGMENTATION MAMMAPLASTY    . BREAST ENHANCEMENT SURGERY  2006  . COSMETIC SURGERY    . EYE SURGERY     lasik od  . GYNECOLOGIC CRYOSURGERY  1990  . LAPAROTOMY N/A 04/22/2018   Procedure: EXPLORATORY LAPAROTOMY;  Surgeon: Erroll Luna, MD;  Location: [facility];  Service: General;  Laterality: N/A;  . LIGAMENT REPAIR Right 09/24/2015   Procedure: RIGHT INDEX METACARPAL PHALANGEAL RADIAL COLLATERAL LIGAMENT REPAIR;  Surgeon: Leanora Cover, MD;  Location: [facility];  Service: Orthopedics;  Laterality: Right;  . MASS EXCISION N/A 08/04/2018   Procedure: REMOVAL OF SUTURE FROM ABDOMEN/ERAS PATHWAY;  Surgeon: Erroll Luna, MD;  Location: [facility];  Service: General;  Laterality: N/A;  . OOPHORECTOMY     BSO  . Right knee arthoscopy Right 09/09/2017   [facility]  . TONSILLECTOMY AND ADENOIDECTOMY    . TOTAL KNEE ARTHROPLASTY Right 04/12/2018  . TOTAL KNEE ARTHROPLASTY Right 04/12/2018   Procedure: RIGHT TOTAL KNEE ARTHROPLASTY;  Surgeon: Melrose Nakayama, MD;  Location: [facility];  Service: Orthopedics;  Laterality: Right;  . TUBAL LIGATION    . UPPER GASTROINTESTINAL ENDOSCOPY  04/24/05  . VAGINAL HYSTERECTOMY  2005   TVH BSO  adenomyosis   Social History   Socioeconomic History  . Marital status: Married    Spouse name: Not on file  . Number of children: Not on file  . Years of education: Not on file  . Highest education level: Not on file  Occupational History  . Not on file  Social Needs  . Financial resource strain: Not on file  . Food insecurity:    Worry: Not on file    Inability: Not on file  . Transportation needs:    Medical: Not on file    Non-medical: Not on file  Tobacco Use  . Smoking status:  Former Smoker    Last attempt to quit: 03/21/2006    Years since quitting: 12.5  . Smokeless tobacco: Never Used  Substance and Sexual Activity  . Alcohol use: [name]    Alcohol/week: 0.0 standard drinks    Comment: drinking in 1993  . Drug use: [name]  . Sexual activity: Yes    Birth control/protection: Surgical    Comment: HYST-1st intercourse 62 yo-More than 5 partners  Lifestyle  . Physical activity:    Days per week: Not on file    Minutes per session: Not on file  . Stress: Not on file  Relationships  . Social connections:    Talks on phone: Not on file    Gets together: Not on file    Attends religious service: Not on file    Active member of club or  organization: Not on file    Attends meetings of clubs or organizations: Not on file    Relationship status: Not on file  Other Topics Concern  . Not on file  Social History Narrative  . Not on file   Allergies  Allergen Reactions  . Morphine And Related Nausea Only   Family History  Problem Relation Age of Onset  . Hypertension Mother   . Cancer Father 78       Lymphoma  . Breast cancer Sister 85  . Colon cancer Neg Hx      Current Outpatient Medications (Cardiovascular):  .  simvastatin (ZOCOR) 20 MG tablet, Take 1 tablet (20 mg total) by mouth daily.  Current Outpatient Medications (Respiratory):  .  montelukast (SINGULAIR) 10 MG tablet, Take 1 tablet (10 mg total) by mouth at bedtime.  Current Outpatient Medications (Analgesics):  .  ibuprofen (ADVIL,MOTRIN) 800 MG tablet, Take 1 tablet (800 mg total) by mouth every 8 (eight) hours as needed. Marland Kitchen  oxyCODONE (OXY IR/ROXICODONE) 5 MG immediate release tablet, Take 1 tablet (5 mg total) by mouth every 6 (six) hours as needed for severe pain. .  traMADol (ULTRAM) 50 MG tablet, Take 50 mg by mouth every 6 (six) hours as needed. .  colchicine 0.6 MG tablet, Take 1 tablet (0.6 mg total) by mouth 2 (two) times daily.   Current Outpatient Medications (Other):  .  b complex vitamins tablet, Take 1 tablet by mouth daily. .  bisacodyl (DULCOLAX) 5 MG EC tablet, Take 1 tablet (5 mg total) by mouth daily as needed for moderate constipation. .  Carboxymethylcellulose Sodium (THERATEARS) 0.25 % SOLN, Place 1 drop into both eyes 2 (two) times daily as needed (dry eyes). .  cholecalciferol (VITAMIN D) 1000 UNITS tablet, Take 1,000 Units by mouth daily. Marland Kitchen  docusate sodium (COLACE) 100 MG capsule, Take 1 capsule (100 mg total) by mouth 2 (two) times daily. Marland Kitchen  gabapentin (NEURONTIN) 600 MG tablet, Take 1,200 mg by mouth 2 (two) times daily.  Marland Kitchen  lamoTRIgine (LAMICTAL) 100 MG tablet, Take 100 mg by mouth 2 (two) times daily.  Marland Kitchen  PROCTOZONE-HC  2.5 % rectal cream, apply rectally twice a day (Patient taking differently: Apply rectally twice a day as needed for irritations) .  valACYclovir (VALTREX) 1000 MG tablet, Take 1 tablet (1,000 mg total) by mouth 2 (two) times daily. .  vitamin C (ASCORBIC ACID) 500 MG tablet, Take 500 mg by mouth daily. .  Vitamin D, Ergocalciferol, (DRISDOL) 1.25 MG (50000 UT) CAPS capsule, Take 1 capsule (50,000 Units total) by mouth every 7 (seven) days.    Past medical history, social, surgical  and family history all reviewed in electronic medical record.  [name]   Review of Systems:  [name], visual changes, nausea, vomiting, diarrhea, constipation, dizziness, abdominal pain, skin rash, fevers, chills, night sweats, weight loss, swollen lymph nodes, body aches,, chest pain, shortness of breath, mood changes.  Positive muscle aches, joint swelling  Objective  Blood pressure 112/86, height 5\' 5"  (1.651 m), weight 117 lb (53.1 kg).   General: [name], dressed appropriately.  HEENT: Pupils equal, extraocular movements intact  Respiratory: Patient's speak in full sentences and does not appear short of breath  Cardiovascular: [name], non tender, [name]  Skin: Warm dry intact with [name] signs of infection or rash on extremities or on axial skeleton.  Abdomen: Soft nontender  Neuro: Cranial nerves II through XII are intact, neurovascularly intact in all extremities with 2+ DTRs and 2+ pulses.  Lymph: [name] lymphadenopathy of posterior or anterior cervical chain or axillae bilaterally.  Gait mild antalgic MSK:  Non tender with full range of motion and good stability and symmetric strength and tone of shoulders, elbows, wrist, hip, and ankles bilaterally.  Right knee with trace effusion.  Tenderness more over the medial joint line of the replacement.  [name] signs of true  infection.  Lacks the last 90 degrees of extension and only has 90 degrees of flexion.  [name] significant instability noted.   Limited musculoskeletal ultrasound was performed and interpreted by Lyndal Pulley  Limited ultrasound of patient's knee shows that there is a joint effusion noted.  Minorly increased from previous exam.  Patient over the medial joint line has a soft tissue finding that does have some increasing in Doppler flow as well as calcific deposits.  Unable to tell if this goes intra-articular.  Seems to be extra-articular.  Possible calcific MCL or calcific bursitis Impression: Calcific change over the medial joint line possible scar tissue versus calcific ligament or bursa   Impression and Recommendations:     This case required medical decision making of moderate complexity. The above documentation has been reviewed and is accurate and complete Lyndal Pulley, DO       Note: This dictation was prepared with Dragon dictation along with smaller phrase technology. Any transcriptional errors that result from this process are unintentional.

## 2018-09-20 ENCOUNTER — Encounter: Payer: Self-pay | Admitting: Family Medicine

## 2018-09-20 ENCOUNTER — Ambulatory Visit: Payer: Self-pay

## 2018-09-20 ENCOUNTER — Ambulatory Visit (INDEPENDENT_AMBULATORY_CARE_PROVIDER_SITE_OTHER): Payer: 59 | Admitting: Family Medicine

## 2018-09-20 VITALS — BP 112/86 | Ht 65.0 in | Wt 117.0 lb

## 2018-09-20 DIAGNOSIS — T8484XA Pain due to internal orthopedic prosthetic devices, implants and grafts, initial encounter: Secondary | ICD-10-CM | POA: Diagnosis not present

## 2018-09-20 DIAGNOSIS — G8929 Other chronic pain: Secondary | ICD-10-CM | POA: Diagnosis not present

## 2018-09-20 DIAGNOSIS — M869 Osteomyelitis, unspecified: Secondary | ICD-10-CM

## 2018-09-20 DIAGNOSIS — M25561 Pain in right knee: Secondary | ICD-10-CM

## 2018-09-20 DIAGNOSIS — Z96659 Presence of unspecified artificial knee joint: Secondary | ICD-10-CM

## 2018-09-20 MED ORDER — VITAMIN D (ERGOCALCIFEROL) 1.25 MG (50000 UNIT) PO CAPS: 50000.0000 [IU] | ORAL_CAPSULE | ORAL | 0 refills | Status: DC

## 2018-09-20 MED ORDER — COLCHICINE 0.6 MG PO TABS: 0.6000 mg | ORAL_TABLET | Freq: Two times a day (BID) | ORAL | 2 refills | Status: DC

## 2018-09-20 NOTE — Patient Instructions (Addendum)
Good to see you  Once weekly vitamin D during the winter.  Keep working on range of motion  Try the colchicine 2 times a day for 5 days  Bone scan ordered but lets talk to [facility] as well  Otherwise another manipulation could be good.  I am here if you need me

## 2018-09-20 NOTE — Assessment & Plan Note (Signed)
Patient continues to have some lack of range of motion that I do feel is likely more secondary to some scar tissue.  Patient does have still some mild swelling from the joint.  Patient is not even 6 months past the manipulation at this time.  Patient did not respond to the off label use of the Singulair at this time.  Patient was found to have borderline vitamin D and increase at this moment secondary to the calcific changes within the soft tissues that were noted today on ultrasound.  We have ordered a bone scan but I do not think that there is any loosening and would like her to discuss this with her surgeon and we will send our note as well.  Patient can follow-up with me again if she wants any other further evaluation in the next 2 months.

## 2018-09-21 ENCOUNTER — Encounter: Payer: Self-pay | Admitting: Family Medicine

## 2018-09-21 MED ORDER — COLCHICINE 0.6 MG PO TABS: 0.6000 mg | ORAL_TABLET | Freq: Two times a day (BID) | ORAL | 0 refills | Status: DC

## 2018-09-26 ENCOUNTER — Encounter: Payer: Self-pay | Admitting: Family Medicine

## 2018-09-28 DIAGNOSIS — Z96651 Presence of right artificial knee joint: Secondary | ICD-10-CM | POA: Diagnosis not present

## 2018-09-28 DIAGNOSIS — M25561 Pain in right knee: Secondary | ICD-10-CM | POA: Diagnosis not present

## 2018-09-30 ENCOUNTER — Encounter (HOSPITAL_COMMUNITY): Payer: Self-pay

## 2018-09-30 ENCOUNTER — Encounter (HOSPITAL_COMMUNITY): Payer: 59

## 2018-09-30 ENCOUNTER — Ambulatory Visit (HOSPITAL_COMMUNITY): Payer: 59

## 2018-11-14 DIAGNOSIS — M25561 Pain in right knee: Secondary | ICD-10-CM | POA: Diagnosis not present

## 2019-01-16 ENCOUNTER — Encounter: Payer: Self-pay | Admitting: Family Medicine

## 2019-01-18 ENCOUNTER — Ambulatory Visit: Payer: 59 | Admitting: Family Medicine

## 2019-01-27 ENCOUNTER — Other Ambulatory Visit: Payer: Self-pay | Admitting: Family Medicine

## 2019-01-27 DIAGNOSIS — E785 Hyperlipidemia, unspecified: Secondary | ICD-10-CM

## 2019-01-31 DIAGNOSIS — F411 Generalized anxiety disorder: Secondary | ICD-10-CM | POA: Diagnosis not present

## 2019-01-31 DIAGNOSIS — F502 Bulimia nervosa: Secondary | ICD-10-CM | POA: Diagnosis not present

## 2019-01-31 DIAGNOSIS — F1421 Cocaine dependence, in remission: Secondary | ICD-10-CM | POA: Diagnosis not present

## 2019-01-31 DIAGNOSIS — F1521 Other stimulant dependence, in remission: Secondary | ICD-10-CM | POA: Diagnosis not present

## 2019-02-08 ENCOUNTER — Other Ambulatory Visit: Payer: Self-pay

## 2019-02-08 MED ORDER — VITAMIN D (ERGOCALCIFEROL) 1.25 MG (50000 UNIT) PO CAPS: 50000.0000 [IU] | ORAL_CAPSULE | ORAL | 0 refills | Status: DC

## 2019-02-10 NOTE — Progress Notes (Deleted)
[name] [address] [place], [place] [address] Phone: [phone] Subjective:    I'm seeing this patient by the request  of:    CC:   [id number]  Bethany [name] is a 63 y.o. female coming in with complaint of ***  Onset-  Location Duration-  Character- Aggravating factors- Reliving factors-  Therapies tried-  Severity-     Past Medical History:  Diagnosis Date  . Anxiety   . Arthritis   . Cervical dysplasia 1990   CIN 2 subsequent cryosurgery. All Paps normal afterwards  . Dyslipidemia   . Herpes genitalis   . Herpes labialis   . Hypercholesteremia   . IBS (irritable bowel syndrome)   . Osteopenia 04/2014   T score -1.7 FRAX 7.3%/0.8% stable from prior DEXA 2013  . Raynaud disease    Past Surgical History:  Procedure Laterality Date  . AUGMENTATION MAMMAPLASTY    . BREAST ENHANCEMENT SURGERY  2006  . COSMETIC SURGERY    . EYE SURGERY     lasik od  . GYNECOLOGIC CRYOSURGERY  1990  . LAPAROTOMY N/A 04/22/2018   Procedure: EXPLORATORY LAPAROTOMY;  Surgeon: Erroll Luna, MD;  Location: [facility];  Service: General;  Laterality: N/A;  . LIGAMENT REPAIR Right 09/24/2015   Procedure: RIGHT INDEX METACARPAL PHALANGEAL RADIAL COLLATERAL LIGAMENT REPAIR;  Surgeon: Leanora Cover, MD;  Location: [facility];  Service: Orthopedics;  Laterality: Right;  . MASS EXCISION N/A 08/04/2018   Procedure: REMOVAL OF SUTURE FROM ABDOMEN/ERAS PATHWAY;  Surgeon: Erroll Luna, MD;  Location: [facility];  Service: General;  Laterality: N/A;  . OOPHORECTOMY     BSO  . Right knee arthoscopy Right 09/09/2017   [facility]  . TONSILLECTOMY AND ADENOIDECTOMY    . TOTAL KNEE ARTHROPLASTY Right 04/12/2018  . TOTAL KNEE ARTHROPLASTY Right 04/12/2018   Procedure: RIGHT TOTAL KNEE ARTHROPLASTY;  Surgeon: Melrose Nakayama, MD;  Location: [facility];  Service: Orthopedics;  Laterality: Right;  . TUBAL LIGATION    . UPPER  GASTROINTESTINAL ENDOSCOPY  04/24/05  . VAGINAL HYSTERECTOMY  2005   TVH BSO  adenomyosis   Social History   Socioeconomic History  . Marital status: Married    Spouse name: Not on file  . Number of children: Not on file  . Years of education: Not on file  . Highest education level: Not on file  Occupational History  . Not on file  Social Needs  . Financial resource strain: Not on file  . Food insecurity:    Worry: Not on file    Inability: Not on file  . Transportation needs:    Medical: Not on file    Non-medical: Not on file  Tobacco Use  . Smoking status: Former Smoker    Last attempt to quit: 03/21/2006    Years since quitting: 12.9  . Smokeless tobacco: Never Used  Substance and Sexual Activity  . Alcohol use: No    Alcohol/week: 0.0 standard drinks    Comment: drinking in 1993  . Drug use: No  . Sexual activity: Yes    Birth control/protection: Surgical    Comment: HYST-1st intercourse 63 yo-More than 5 partners  Lifestyle  . Physical activity:    Days per week: Not on file    Minutes per session: Not on file  . Stress: Not on file  Relationships  . Social connections:    Talks on phone: Not on file    Gets together:  Not on file    Attends religious service: Not on file    Active member of club or organization: Not on file    Attends meetings of clubs or organizations: Not on file    Relationship status: Not on file  Other Topics Concern  . Not on file  Social History Narrative  . Not on file   Allergies  Allergen Reactions  . Morphine And Related Nausea Only   Family History  Problem Relation Age of Onset  . Hypertension Mother   . Cancer Father 15       Lymphoma  . Breast cancer Sister 95  . Colon cancer Neg Hx      Current Outpatient Medications (Cardiovascular):  .  simvastatin (ZOCOR) 20 MG tablet, TAKE 1 TABLET BY MOUTH ONCE DAILY  Current Outpatient Medications (Respiratory):  .  montelukast (SINGULAIR) 10 MG tablet, Take 1 tablet  (10 mg total) by mouth at bedtime.  Current Outpatient Medications (Analgesics):  .  colchicine 0.6 MG tablet, Take 1 tablet (0.6 mg total) by mouth 2 (two) times daily. Marland Kitchen  ibuprofen (ADVIL,MOTRIN) 800 MG tablet, Take 1 tablet (800 mg total) by mouth every 8 (eight) hours as needed. Marland Kitchen  oxyCODONE (OXY IR/ROXICODONE) 5 MG immediate release tablet, Take 1 tablet (5 mg total) by mouth every 6 (six) hours as needed for severe pain. .  traMADol (ULTRAM) 50 MG tablet, Take 50 mg by mouth every 6 (six) hours as needed.   Current Outpatient Medications (Other):  .  b complex vitamins tablet, Take 1 tablet by mouth daily. .  bisacodyl (DULCOLAX) 5 MG EC tablet, Take 1 tablet (5 mg total) by mouth daily as needed for moderate constipation. .  Carboxymethylcellulose Sodium (THERATEARS) 0.25 % SOLN, Place 1 drop into both eyes 2 (two) times daily as needed (dry eyes). .  cholecalciferol (VITAMIN D) 1000 UNITS tablet, Take 1,000 Units by mouth daily. Marland Kitchen  docusate sodium (COLACE) 100 MG capsule, Take 1 capsule (100 mg total) by mouth 2 (two) times daily. Marland Kitchen  gabapentin (NEURONTIN) 600 MG tablet, Take 1,200 mg by mouth 2 (two) times daily.  Marland Kitchen  lamoTRIgine (LAMICTAL) 100 MG tablet, Take 100 mg by mouth 2 (two) times daily.  Marland Kitchen  PROCTOZONE-HC 2.5 % rectal cream, apply rectally twice a day (Patient taking differently: Apply rectally twice a day as needed for irritations) .  valACYclovir (VALTREX) 1000 MG tablet, Take 1 tablet (1,000 mg total) by mouth 2 (two) times daily. .  vitamin C (ASCORBIC ACID) 500 MG tablet, Take 500 mg by mouth daily. .  Vitamin D, Ergocalciferol, (DRISDOL) 1.25 MG (50000 UT) CAPS capsule, Take 1 capsule (50,000 Units total) by mouth every 7 (seven) days.    Past medical history, social, surgical and family history all reviewed in electronic medical record.  No pertanent information unless stated regarding to the chief complaint.   Review of Systems:  No headache, visual changes,  nausea, vomiting, diarrhea, constipation, dizziness, abdominal pain, skin rash, fevers, chills, night sweats, weight loss, swollen lymph nodes, body aches, joint swelling, muscle aches, chest pain, shortness of breath, mood changes.   Objective  There were no vitals taken for this visit. Systems examined below as of    General: No apparent distress alert and oriented x3 mood and affect normal, dressed appropriately.  HEENT: Pupils equal, extraocular movements intact  Respiratory: Patient's speak in full sentences and does not appear short of breath  Cardiovascular: No lower extremity edema, non tender, no erythema  Skin: Warm dry intact with no signs of infection or rash on extremities or on axial skeleton.  Abdomen: Soft nontender  Neuro: Cranial nerves II through XII are intact, neurovascularly intact in all extremities with 2+ DTRs and 2+ pulses.  Lymph: No lymphadenopathy of posterior or anterior cervical chain or axillae bilaterally.  Gait normal with good balance and coordination.  MSK:  Non tender with full range of motion and good stability and symmetric strength and tone of shoulders, elbows, wrist, hip, knee and ankles bilaterally.     Impression and Recommendations:     This case required medical decision making of moderate complexity. The above documentation has been reviewed and is accurate and complete Lyndal Pulley, DO       Note: This dictation was prepared with Dragon dictation along with smaller phrase technology. Any transcriptional errors that result from this process are unintentional.

## 2019-02-13 ENCOUNTER — Ambulatory Visit: Payer: Self-pay | Admitting: Family Medicine

## 2019-02-14 ENCOUNTER — Other Ambulatory Visit: Payer: Self-pay

## 2019-02-14 ENCOUNTER — Ambulatory Visit: Payer: Self-pay

## 2019-02-14 ENCOUNTER — Ambulatory Visit: Payer: Federal, State, Local not specified - PPO | Admitting: Family Medicine

## 2019-02-14 ENCOUNTER — Encounter: Payer: Self-pay | Admitting: Family Medicine

## 2019-02-14 VITALS — BP 118/92 | Ht 65.0 in | Wt 116.0 lb

## 2019-02-14 DIAGNOSIS — T8484XA Pain due to internal orthopedic prosthetic devices, implants and grafts, initial encounter: Secondary | ICD-10-CM

## 2019-02-14 DIAGNOSIS — G8929 Other chronic pain: Secondary | ICD-10-CM | POA: Diagnosis not present

## 2019-02-14 DIAGNOSIS — Z96659 Presence of unspecified artificial knee joint: Secondary | ICD-10-CM | POA: Diagnosis not present

## 2019-02-14 DIAGNOSIS — M25561 Pain in right knee: Principal | ICD-10-CM

## 2019-02-14 NOTE — Progress Notes (Signed)
[name] [address], [place] [address] Phone: [phone] Subjective:    [name], am serving as a scribe for Dr. Hulan Saas.  CC: right knee pain   LFY:[id number]   09-20-2018: Patient continues to have some lack of range of motion that I do feel is likely more secondary to some scar tissue.  Patient does have still some mild swelling from the joint.  Patient is not even 6 months past the manipulation at this time.  Patient did not respond to the off label use of the Singulair at this time.  Patient was found to have borderline vitamin D and increase at this moment secondary to the calcific changes within the soft tissues that were noted today on ultrasound.  We have ordered a bone scan but I do not think that there is any loosening and would like her to discuss this with her surgeon and we will send our note as well.  Patient can follow-up with me again if she wants any other further evaluation in the next 2 months.  Update 02/14/2019: Bethany [name] is a 63 y.o. female coming in with complaint of right knee pain.  Patient was given knee replacement.  Has had difficulty including significant scar tissue formation.  Had manipulation 1 time already.  Patient has been able to walk but has pain on a daily basis.  States that most of it seems to be on the lateral aspect.  Patient continues to have pain daily. Stairs are most bothersome. Is frustrated that her knee has not felt the same since her knee replacement last year.  Patient states unfortunately having difficulty overall.       Past Medical History:  Diagnosis Date  . Anxiety   . Arthritis   . Cervical dysplasia 1990   CIN 2 subsequent cryosurgery. All Paps normal afterwards  . Dyslipidemia   . Herpes genitalis   . Herpes labialis   . Hypercholesteremia   . IBS (irritable bowel syndrome)   . Osteopenia 04/2014   T score -1.7 FRAX 7.3%/0.8% stable from prior DEXA 2013  .  Raynaud disease    Past Surgical History:  Procedure Laterality Date  . AUGMENTATION MAMMAPLASTY    . BREAST ENHANCEMENT SURGERY  2006  . COSMETIC SURGERY    . EYE SURGERY     lasik od  . GYNECOLOGIC CRYOSURGERY  1990  . LAPAROTOMY N/A 04/22/2018   Procedure: EXPLORATORY LAPAROTOMY;  Surgeon: Erroll Luna, MD;  Location: [facility];  Service: General;  Laterality: N/A;  . LIGAMENT REPAIR Right 09/24/2015   Procedure: RIGHT INDEX METACARPAL PHALANGEAL RADIAL COLLATERAL LIGAMENT REPAIR;  Surgeon: Leanora Cover, MD;  Location: [facility];  Service: Orthopedics;  Laterality: Right;  . MASS EXCISION N/A 08/04/2018   Procedure: REMOVAL OF SUTURE FROM ABDOMEN/ERAS PATHWAY;  Surgeon: Erroll Luna, MD;  Location: [facility];  Service: General;  Laterality: N/A;  . OOPHORECTOMY     BSO  . Right knee arthoscopy Right 09/09/2017   [facility]  . TONSILLECTOMY AND ADENOIDECTOMY    . TOTAL KNEE ARTHROPLASTY Right 04/12/2018  . TOTAL KNEE ARTHROPLASTY Right 04/12/2018   Procedure: RIGHT TOTAL KNEE ARTHROPLASTY;  Surgeon: Melrose Nakayama, MD;  Location: [facility];  Service: Orthopedics;  Laterality: Right;  . TUBAL LIGATION    . UPPER GASTROINTESTINAL ENDOSCOPY  04/24/05  . VAGINAL HYSTERECTOMY  2005   TVH BSO  adenomyosis   Social History   Socioeconomic  History  . Marital status: Married    Spouse name: Not on file  . Number of children: Not on file  . Years of education: Not on file  . Highest education level: Not on file  Occupational History  . Not on file  Social Needs  . Financial resource strain: Not on file  . Food insecurity:    Worry: Not on file    Inability: Not on file  . Transportation needs:    Medical: Not on file    Non-medical: Not on file  Tobacco Use  . Smoking status: Former Smoker    Last attempt to quit: 03/21/2006    Years since quitting: 12.9  . Smokeless tobacco: Never Used  Substance and Sexual Activity  . Alcohol use: No     Alcohol/week: 0.0 standard drinks    Comment: drinking in 1993  . Drug use: No  . Sexual activity: Yes    Birth control/protection: Surgical    Comment: HYST-1st intercourse 63 yo-More than 5 partners  Lifestyle  . Physical activity:    Days per week: Not on file    Minutes per session: Not on file  . Stress: Not on file  Relationships  . Social connections:    Talks on phone: Not on file    Gets together: Not on file    Attends religious service: Not on file    Active member of club or organization: Not on file    Attends meetings of clubs or organizations: Not on file    Relationship status: Not on file  Other Topics Concern  . Not on file  Social History Narrative  . Not on file   Allergies  Allergen Reactions  . Morphine And Related Nausea Only   Family History  Problem Relation Age of Onset  . Hypertension Mother   . Cancer Father 69       Lymphoma  . Breast cancer Sister 39  . Colon cancer Neg Hx      Current Outpatient Medications (Cardiovascular):  .  simvastatin (ZOCOR) 20 MG tablet, TAKE 1 TABLET BY MOUTH ONCE DAILY  Current Outpatient Medications (Respiratory):  .  montelukast (SINGULAIR) 10 MG tablet, Take 1 tablet (10 mg total) by mouth at bedtime.  Current Outpatient Medications (Analgesics):  .  colchicine 0.6 MG tablet, Take 1 tablet (0.6 mg total) by mouth 2 (two) times daily. Marland Kitchen  ibuprofen (ADVIL,MOTRIN) 800 MG tablet, Take 1 tablet (800 mg total) by mouth every 8 (eight) hours as needed. Marland Kitchen  oxyCODONE (OXY IR/ROXICODONE) 5 MG immediate release tablet, Take 1 tablet (5 mg total) by mouth every 6 (six) hours as needed for severe pain. .  traMADol (ULTRAM) 50 MG tablet, Take 50 mg by mouth every 6 (six) hours as needed.   Current Outpatient Medications (Other):  .  b complex vitamins tablet, Take 1 tablet by mouth daily. .  bisacodyl (DULCOLAX) 5 MG EC tablet, Take 1 tablet (5 mg total) by mouth daily as needed for moderate constipation. .   Carboxymethylcellulose Sodium (THERATEARS) 0.25 % SOLN, Place 1 drop into both eyes 2 (two) times daily as needed (dry eyes). .  cholecalciferol (VITAMIN D) 1000 UNITS tablet, Take 1,000 Units by mouth daily. Marland Kitchen  docusate sodium (COLACE) 100 MG capsule, Take 1 capsule (100 mg total) by mouth 2 (two) times daily. Marland Kitchen  gabapentin (NEURONTIN) 600 MG tablet, Take 1,200 mg by mouth 2 (two) times daily.  Marland Kitchen  lamoTRIgine (LAMICTAL) 100 MG tablet, Take 100  mg by mouth 2 (two) times daily.  Marland Kitchen  PROCTOZONE-HC 2.5 % rectal cream, apply rectally twice a day (Patient taking differently: Apply rectally twice a day as needed for irritations) .  valACYclovir (VALTREX) 1000 MG tablet, Take 1 tablet (1,000 mg total) by mouth 2 (two) times daily. .  vitamin C (ASCORBIC ACID) 500 MG tablet, Take 500 mg by mouth daily. .  Vitamin D, Ergocalciferol, (DRISDOL) 1.25 MG (50000 UT) CAPS capsule, Take 1 capsule (50,000 Units total) by mouth every 7 (seven) days.    Past medical history, social, surgical and family history all reviewed in electronic medical record.  No pertanent information unless stated regarding to the chief complaint.   Review of Systems:  No headache, visual changes, nausea, vomiting, diarrhea, constipation, dizziness, abdominal pain, skin rash, fevers, chills, night sweats, weight loss, swollen lymph nodes, body aches, joint swelling, chest pain, shortness of breath, mood changes.  Positive muscle aches  Objective  Blood pressure (!) 118/92, height 5\' 5"  (1.651 m), weight 116 lb (52.6 kg).    General: No apparent distress alert and oriented x3 mood and affect normal, dressed appropriately.  HEENT: Pupils equal, extraocular movements intact  Respiratory: Patient's speak in full sentences and does not appear short of breath  Cardiovascular: No lower extremity edema, non tender, no erythema  Skin: Warm dry intact with no signs of infection or rash on extremities or on axial skeleton.  Abdomen: Soft  nontender  Neuro: Cranial nerves II through XII are intact, neurovascularly intact in all extremities with 2+ DTRs and 2+ pulses.  Lymph: No lymphadenopathy of posterior or anterior cervical chain or axillae bilaterally.  Gait antalgic MSK:  Non tender with full range of motion and good stability and symmetric strength and tone of shoulders, elbows, wrist, hip and ankles bilaterally.   Knee: Right Trace effusion noted.  Patient's incision well-healed Tender over the medial and lateral joint lines ROM limited significantly with only 85 degrees of flexion painful patellar compression.  Noted Patellar glide with mild crepitus. Patellar and quadriceps tendons unremarkable. Hamstring and quadriceps strength is normal.  MSK US performed of: Right knee This study was ordered, performed, and interpreted by Charlann Boxer D.O.  Knee: All structures visualized. Patient does have increasing Doppler flow over the lateral and medial aspect of the knee replacement noted as well.  Trace effusion noted as well.  I do not see any signs of any infectious etiology.  Scar tissue formation noted over the anterior aspect of the knee as well as the superior lateral aspect  IMPRESSION:   Increased inflammation of the knee replacement.     Impression and Recommendations:     This case required medical decision making of moderate complexity. The above documentation has been reviewed and is accurate and complete Lyndal Pulley, DO       Note: This dictation was prepared with Dragon dictation along with smaller phrase technology. Any transcriptional errors that result from this process are unintentional.

## 2019-02-14 NOTE — Assessment & Plan Note (Signed)
Discussed with patient in great length.  Patient states that he is not making any significant improvement in since patient had the knee replacement.  Feels like he is unable to move it.  Did have the manipulation of the knee one time with no improvement either.  We have attempted Singulair for any type of allergic response that did not seem to be beneficial.  Have even tried colchicine for any type of gout which also did not seem to be improving.  Patient feels that there is some increasing instability and I do feel at this time we should consider the possibility of a bone scan.  Discussed a low-dose MRI but I do think for the knee and may cause too much of an artifact.  Patient will be set up for the bone scan and if we see any significant loosening will discuss with patient's orthopedic surgeon.  Spent  25 minutes with patient face-to-face and had greater than 50% of counseling including as described above in assessment and plan.

## 2019-02-17 ENCOUNTER — Encounter: Payer: Self-pay | Admitting: Family Medicine

## 2019-02-28 DIAGNOSIS — D2261 Melanocytic nevi of right upper limb, including shoulder: Secondary | ICD-10-CM | POA: Diagnosis not present

## 2019-02-28 DIAGNOSIS — Z85828 Personal history of other malignant neoplasm of skin: Secondary | ICD-10-CM | POA: Diagnosis not present

## 2019-02-28 DIAGNOSIS — L570 Actinic keratosis: Secondary | ICD-10-CM | POA: Diagnosis not present

## 2019-02-28 DIAGNOSIS — C44612 Basal cell carcinoma of skin of right upper limb, including shoulder: Secondary | ICD-10-CM | POA: Diagnosis not present

## 2019-02-28 DIAGNOSIS — D225 Melanocytic nevi of trunk: Secondary | ICD-10-CM | POA: Diagnosis not present

## 2019-02-28 DIAGNOSIS — L821 Other seborrheic keratosis: Secondary | ICD-10-CM | POA: Diagnosis not present

## 2019-02-28 HISTORY — PX: SKIN BIOPSY: SHX1

## 2019-03-07 ENCOUNTER — Encounter: Payer: Self-pay | Admitting: General Surgery

## 2019-03-08 ENCOUNTER — Encounter: Payer: Self-pay | Admitting: Family Medicine

## 2019-03-08 ENCOUNTER — Encounter

## 2019-03-08 ENCOUNTER — Encounter: Payer: Self-pay | Admitting: Gastroenterology

## 2019-03-08 ENCOUNTER — Other Ambulatory Visit: Payer: Self-pay

## 2019-03-08 ENCOUNTER — Ambulatory Visit (INDEPENDENT_AMBULATORY_CARE_PROVIDER_SITE_OTHER): Payer: Federal, State, Local not specified - PPO | Admitting: Gastroenterology

## 2019-03-08 ENCOUNTER — Ambulatory Visit (INDEPENDENT_AMBULATORY_CARE_PROVIDER_SITE_OTHER)
Admission: RE | Admit: 2019-03-08 | Discharge: 2019-03-08 | Disposition: A | Discharge status: Home / Self Care | Payer: Federal, State, Local not specified - PPO | Source: Ambulatory Visit | Attending: Gastroenterology | Admitting: Gastroenterology

## 2019-03-08 VITALS — Ht 65.0 in | Wt 116.0 lb

## 2019-03-08 DIAGNOSIS — R109 Unspecified abdominal pain: Secondary | ICD-10-CM

## 2019-03-08 DIAGNOSIS — R140 Abdominal distension (gaseous): Secondary | ICD-10-CM | POA: Diagnosis not present

## 2019-03-08 DIAGNOSIS — K5904 Chronic idiopathic constipation: Secondary | ICD-10-CM

## 2019-03-08 DIAGNOSIS — Z8719 Personal history of other diseases of the digestive system: Secondary | ICD-10-CM

## 2019-03-08 DIAGNOSIS — K5909 Other constipation: Secondary | ICD-10-CM | POA: Diagnosis not present

## 2019-03-08 NOTE — Progress Notes (Signed)
Bethany [name]    [id number]    1956/07/10  Primary Care Physician:Lalonde, Elyse Jarvis, MD  Referring Physician: Denita Lung, [address] [place], [place] [address]  This service was provided via audio and video telemedicine (Doximity) due to [place] 19 pandemic.  Patient location: Home Provider location: Office Used 2 patient identifiers to confirm the correct person. Explained the limitations in evaluation and management via telemedicine. Patient is aware of potential medical charges for this visit.  Patient consented to this virtual visit.  The persons participating in this telemedicine service were myself and the patient   Chief complaint: Abdominal pain HPI:  Colonoscopy 04/10/2016: 5 mm sessile polyp, melanosis, internal hemorrhoids  She was hospitalized after knee replacement surgery in June 2011 with constipation, abdominal pain and vomiting.  CT abd & pelvis showed moderate to high grade partial small bowel obstruction.  She had undergo exlap with lysis with adhesions and resection of distal ileum. Pathology chronic inflammation with adhesions, negative for dysplasia or malignancy.  Her bowel habits remain irregular with alternating constipation and diarrhea. She doesn't have solid stool, most days its semiformed. She has excessive bloating and lower abdominal cramps when constipated. She is worried about recurrent bowel obstruction and adhesions.  No nausea or vomiting. No rectal bleeding.  Weight is stable.    Outpatient Encounter Medications as of 03/08/2019  Medication Sig  . b complex vitamins tablet Take 1 tablet by mouth daily.  . bisacodyl (DULCOLAX) 5 MG EC tablet Take 1 tablet (5 mg total) by mouth daily as needed for moderate constipation.  . cholecalciferol (VITAMIN D) 1000 UNITS tablet Take 1,000 Units by mouth daily.  Marland Kitchen docusate sodium (COLACE) 100 MG capsule Take 1 capsule (100 mg total) by mouth 2 (two) times daily.  Marland Kitchen gabapentin  (NEURONTIN) 600 MG tablet Take 1,200 mg by mouth 2 (two) times daily.   Marland Kitchen ibuprofen (ADVIL,MOTRIN) 800 MG tablet Take 1 tablet (800 mg total) by mouth every 8 (eight) hours as needed.  . lamoTRIgine (LAMICTAL) 100 MG tablet Take 100 mg by mouth 2 (two) times daily.   . montelukast (SINGULAIR) 10 MG tablet Take 1 tablet (10 mg total) by mouth at bedtime.  Marland Kitchen oxyCODONE (OXY IR/ROXICODONE) 5 MG immediate release tablet Take 1 tablet (5 mg total) by mouth every 6 (six) hours as needed for severe pain.  Marland Kitchen PROCTOZONE-HC 2.5 % rectal cream apply rectally twice a day (Patient taking differently: Apply rectally twice a day as needed for irritations)  . simvastatin (ZOCOR) 20 MG tablet TAKE 1 TABLET BY MOUTH ONCE DAILY  . traMADol (ULTRAM) 50 MG tablet Take 50 mg by mouth every 6 (six) hours as needed.  . valACYclovir (VALTREX) 1000 MG tablet Take 1 tablet (1,000 mg total) by mouth 2 (two) times daily.  . vitamin C (ASCORBIC ACID) 500 MG tablet Take 500 mg by mouth daily.  . Vitamin D, Ergocalciferol, (DRISDOL) 1.25 MG (50000 UT) CAPS capsule Take 1 capsule (50,000 Units total) by mouth every 7 (seven) days.   No facility-administered encounter medications on file as of 03/08/2019.     Allergies as of 03/08/2019 - Review Complete 03/08/2019  Allergen Reaction Noted  . Morphine and related Nausea Only 09/18/2015    Past Medical History:  Diagnosis Date  . Anxiety   . Arthritis   . Cervical dysplasia 1990   CIN 2 subsequent cryosurgery. All Paps normal afterwards  . Dyslipidemia   .  Herpes genitalis   . Herpes labialis   . Hypercholesteremia   . IBS (irritable bowel syndrome)   . Osteopenia 04/2014   T score -1.7 FRAX 7.3%/0.8% stable from prior DEXA 2013  . Raynaud disease     Past Surgical History:  Procedure Laterality Date  . AUGMENTATION MAMMAPLASTY    . BREAST ENHANCEMENT SURGERY  2006  . COSMETIC SURGERY    . EYE SURGERY     lasik od  . GYNECOLOGIC CRYOSURGERY  1990  . LAPAROTOMY  N/A 04/22/2018   Procedure: EXPLORATORY LAPAROTOMY;  Surgeon: Erroll Luna, MD;  Location: [facility];  Service: General;  Laterality: N/A;  . LIGAMENT REPAIR Right 09/24/2015   Procedure: RIGHT INDEX METACARPAL PHALANGEAL RADIAL COLLATERAL LIGAMENT REPAIR;  Surgeon: Leanora Cover, MD;  Location: [facility];  Service: Orthopedics;  Laterality: Right;  . MASS EXCISION N/A 08/04/2018   Procedure: REMOVAL OF SUTURE FROM ABDOMEN/ERAS PATHWAY;  Surgeon: Erroll Luna, MD;  Location: [facility];  Service: General;  Laterality: N/A;  . OOPHORECTOMY     BSO  . Right knee arthoscopy Right 09/09/2017   [facility]  . SKIN BIOPSY Right 02/28/2019   Superficial basal carcinoma   . TONSILLECTOMY AND ADENOIDECTOMY    . TOTAL KNEE ARTHROPLASTY Right 04/12/2018  . TOTAL KNEE ARTHROPLASTY Right 04/12/2018   Procedure: RIGHT TOTAL KNEE ARTHROPLASTY;  Surgeon: Melrose Nakayama, MD;  Location: [facility];  Service: Orthopedics;  Laterality: Right;  . TUBAL LIGATION    . UPPER GASTROINTESTINAL ENDOSCOPY  04/24/05  . VAGINAL HYSTERECTOMY  2005   TVH BSO  adenomyosis    Family History  Problem Relation Age of Onset  . Hypertension Mother   . Cancer Father 27       Lymphoma  . Breast cancer Sister 30  . Colon cancer Neg Hx     Social History   Socioeconomic History  . Marital status: Married    Spouse name: Not on file  . Number of children: Not on file  . Years of education: Not on file  . Highest education level: Not on file  Occupational History  . Not on file  Social Needs  . Financial resource strain: Not on file  . Food insecurity:    Worry: Not on file    Inability: Not on file  . Transportation needs:    Medical: Not on file    Non-medical: Not on file  Tobacco Use  . Smoking status: Former Smoker    Last attempt to quit: 03/21/2006    Years since quitting: 12.9  . Smokeless tobacco: Never Used  Substance and Sexual Activity  . Alcohol use: No     Alcohol/week: 0.0 standard drinks    Comment: drinking in 1993  . Drug use: No  . Sexual activity: Yes    Birth control/protection: Surgical    Comment: HYST-1st intercourse 63 yo-More than 5 partners  Lifestyle  . Physical activity:    Days per week: Not on file    Minutes per session: Not on file  . Stress: Not on file  Relationships  . Social connections:    Talks on phone: Not on file    Gets together: Not on file    Attends religious service: Not on file    Active member of club or organization: Not on file    Attends meetings of clubs or organizations: Not on file    Relationship status: Not on file  . Intimate partner violence:  Fear of current or ex partner: Not on file    Emotionally abused: Not on file    Physically abused: Not on file    Forced sexual activity: Not on file  Other Topics Concern  . Not on file  Social History Narrative  . Not on file      Review of systems: Review of Systems as per HPI All other systems reviewed and are negative.   Physical Exam: Vitals were not taken and physical exam was not performed during this virtual visit.  Data Reviewed:  Reviewed labs, radiology imaging, old records and pertinent past GI work up   Assessment and Plan/Recommendations:  74 yr F with h/o SBO secondary to adhesions s/p ex lap with lysis of adhesions and distal ileum resection 04/2018 Constipation alternating with diarrhea, abdominal bloating and lower abdominal cramps Obtain Abdominal X-ray to assess increased stool burden Will plan for bowel purge with Miralax Stop psyllium Start Benefiber 1 teaspoon TID with meals Increase water intake 8-10 cups daily Follow up telemedicine visit 4 weeks    K. Denzil Magnuson , MD   CC: Denita Lung, MD

## 2019-03-08 NOTE — Patient Instructions (Addendum)
Abdominal X-ray 2 view ( Come to [address] today )   If increased stool burden will plan for bowel purge with Miralax  Dr Silverio Decamp recommends that you complete a bowel purge (to clean out your bowels). Please do the following: Purchase a bottle of Miralax over the counter as well as a box of 5 mg dulcolax tablets. Take 4 dulcolax tablets. Wait 1 hour. You will then drink 6-8 capfuls of Miralax mixed in an adequate amount of water/juice/gatorade (you may choose which of these liquids to drink) over the next 2-3 hours. You should expect results within 1 to 6 hours after completing the bowel purge.  Stop psyllium  Start Benefiber or equivalent 1 teaspoon 2-3 times daily with meals  Follow up tele visit in 4 weeks (Call back for this appointment)

## 2019-03-13 DIAGNOSIS — M25561 Pain in right knee: Secondary | ICD-10-CM | POA: Diagnosis not present

## 2019-03-14 ENCOUNTER — Other Ambulatory Visit: Payer: Self-pay | Admitting: Orthopaedic Surgery

## 2019-03-14 ENCOUNTER — Encounter: Payer: Self-pay | Admitting: Family Medicine

## 2019-03-15 ENCOUNTER — Other Ambulatory Visit (HOSPITAL_COMMUNITY): Payer: Self-pay | Admitting: Orthopaedic Surgery

## 2019-03-15 ENCOUNTER — Other Ambulatory Visit: Payer: Self-pay | Admitting: Orthopaedic Surgery

## 2019-03-15 DIAGNOSIS — M25561 Pain in right knee: Secondary | ICD-10-CM

## 2019-03-22 ENCOUNTER — Telehealth: Payer: Self-pay | Admitting: Family Medicine

## 2019-03-22 NOTE — Telephone Encounter (Signed)
Pt called to see if she should be tested for COVID and per Dr. Redmond School, no need unless having symptoms then she should call us.  Practice social distancing.  Pt informed

## 2019-03-23 DIAGNOSIS — F1521 Other stimulant dependence, in remission: Secondary | ICD-10-CM | POA: Diagnosis not present

## 2019-03-23 DIAGNOSIS — F502 Bulimia nervosa: Secondary | ICD-10-CM | POA: Diagnosis not present

## 2019-03-23 DIAGNOSIS — F411 Generalized anxiety disorder: Secondary | ICD-10-CM | POA: Diagnosis not present

## 2019-03-23 DIAGNOSIS — F1421 Cocaine dependence, in remission: Secondary | ICD-10-CM | POA: Diagnosis not present

## 2019-03-25 ENCOUNTER — Other Ambulatory Visit: Payer: Self-pay | Admitting: Family Medicine

## 2019-03-25 DIAGNOSIS — E785 Hyperlipidemia, unspecified: Secondary | ICD-10-CM

## 2019-04-05 ENCOUNTER — Ambulatory Visit (HOSPITAL_COMMUNITY)
Admission: RE | Admit: 2019-04-05 | Discharge: 2019-04-05 | Disposition: A | Discharge status: Home / Self Care | Payer: Federal, State, Local not specified - PPO | Source: Ambulatory Visit | Attending: Orthopaedic Surgery | Admitting: Orthopaedic Surgery

## 2019-04-05 ENCOUNTER — Other Ambulatory Visit: Payer: Self-pay

## 2019-04-05 ENCOUNTER — Encounter (HOSPITAL_COMMUNITY)
Admission: RE | Admit: 2019-04-05 | Discharge: 2019-04-05 | Disposition: A | Discharge status: Home / Self Care | Payer: Federal, State, Local not specified - PPO | Source: Ambulatory Visit | Attending: Orthopaedic Surgery | Admitting: Orthopaedic Surgery

## 2019-04-05 DIAGNOSIS — M25561 Pain in right knee: Secondary | ICD-10-CM

## 2019-04-05 DIAGNOSIS — M67361 Transient synovitis, right knee: Secondary | ICD-10-CM | POA: Diagnosis not present

## 2019-04-05 MED ORDER — TECHNETIUM TC 99M MEDRONATE IV KIT
20.0000 | PACK | Freq: Once | INTRAVENOUS | Status: AC | PRN
  Administered 2019-04-05: 20 via INTRAVENOUS

## 2019-04-07 DIAGNOSIS — Z96651 Presence of right artificial knee joint: Secondary | ICD-10-CM | POA: Diagnosis not present

## 2019-04-07 DIAGNOSIS — M25561 Pain in right knee: Secondary | ICD-10-CM | POA: Diagnosis not present

## 2019-04-10 ENCOUNTER — Encounter: Payer: Self-pay | Admitting: Family Medicine

## 2019-04-13 DIAGNOSIS — F1421 Cocaine dependence, in remission: Secondary | ICD-10-CM | POA: Diagnosis not present

## 2019-04-13 DIAGNOSIS — F1521 Other stimulant dependence, in remission: Secondary | ICD-10-CM | POA: Diagnosis not present

## 2019-04-13 DIAGNOSIS — F1121 Opioid dependence, in remission: Secondary | ICD-10-CM | POA: Diagnosis not present

## 2019-04-13 DIAGNOSIS — F502 Bulimia nervosa: Secondary | ICD-10-CM | POA: Diagnosis not present

## 2019-05-04 DIAGNOSIS — F502 Bulimia nervosa: Secondary | ICD-10-CM | POA: Diagnosis not present

## 2019-05-04 DIAGNOSIS — F1421 Cocaine dependence, in remission: Secondary | ICD-10-CM | POA: Diagnosis not present

## 2019-05-04 DIAGNOSIS — F411 Generalized anxiety disorder: Secondary | ICD-10-CM | POA: Diagnosis not present

## 2019-05-04 DIAGNOSIS — F1521 Other stimulant dependence, in remission: Secondary | ICD-10-CM | POA: Diagnosis not present

## 2019-05-18 DIAGNOSIS — Z96651 Presence of right artificial knee joint: Secondary | ICD-10-CM | POA: Diagnosis not present

## 2019-05-18 DIAGNOSIS — M24661 Ankylosis, right knee: Secondary | ICD-10-CM | POA: Diagnosis not present

## 2019-05-22 DIAGNOSIS — M25661 Stiffness of right knee, not elsewhere classified: Secondary | ICD-10-CM | POA: Diagnosis not present

## 2019-05-24 DIAGNOSIS — M25661 Stiffness of right knee, not elsewhere classified: Secondary | ICD-10-CM | POA: Diagnosis not present

## 2019-05-29 DIAGNOSIS — M25661 Stiffness of right knee, not elsewhere classified: Secondary | ICD-10-CM | POA: Diagnosis not present

## 2019-05-30 DIAGNOSIS — F1421 Cocaine dependence, in remission: Secondary | ICD-10-CM | POA: Diagnosis not present

## 2019-05-30 DIAGNOSIS — F411 Generalized anxiety disorder: Secondary | ICD-10-CM | POA: Diagnosis not present

## 2019-05-30 DIAGNOSIS — F1521 Other stimulant dependence, in remission: Secondary | ICD-10-CM | POA: Diagnosis not present

## 2019-05-30 DIAGNOSIS — F1120 Opioid dependence, uncomplicated: Secondary | ICD-10-CM | POA: Diagnosis not present

## 2019-05-31 ENCOUNTER — Other Ambulatory Visit: Payer: Self-pay | Admitting: Family Medicine

## 2019-05-31 DIAGNOSIS — E785 Hyperlipidemia, unspecified: Secondary | ICD-10-CM

## 2019-05-31 DIAGNOSIS — M25661 Stiffness of right knee, not elsewhere classified: Secondary | ICD-10-CM | POA: Diagnosis not present

## 2019-05-31 NOTE — Telephone Encounter (Signed)
Patient will call back to schedule her physical for November.

## 2019-06-02 DIAGNOSIS — M25661 Stiffness of right knee, not elsewhere classified: Secondary | ICD-10-CM | POA: Diagnosis not present

## 2019-06-05 DIAGNOSIS — M25661 Stiffness of right knee, not elsewhere classified: Secondary | ICD-10-CM | POA: Diagnosis not present

## 2019-06-07 DIAGNOSIS — M25661 Stiffness of right knee, not elsewhere classified: Secondary | ICD-10-CM | POA: Diagnosis not present

## 2019-06-12 DIAGNOSIS — M25661 Stiffness of right knee, not elsewhere classified: Secondary | ICD-10-CM | POA: Diagnosis not present

## 2019-06-16 DIAGNOSIS — M25661 Stiffness of right knee, not elsewhere classified: Secondary | ICD-10-CM | POA: Diagnosis not present

## 2019-06-19 DIAGNOSIS — M25661 Stiffness of right knee, not elsewhere classified: Secondary | ICD-10-CM | POA: Diagnosis not present

## 2019-06-29 IMAGING — DX ABDOMEN - 2 VIEW
2 series · 2 of 2 positions shown · non-contrast
Comparison: 04/22/2018

CLINICAL DATA: Chronic constipation

EXAM:
ABDOMEN - 2 VIEW

[abdomen erect]
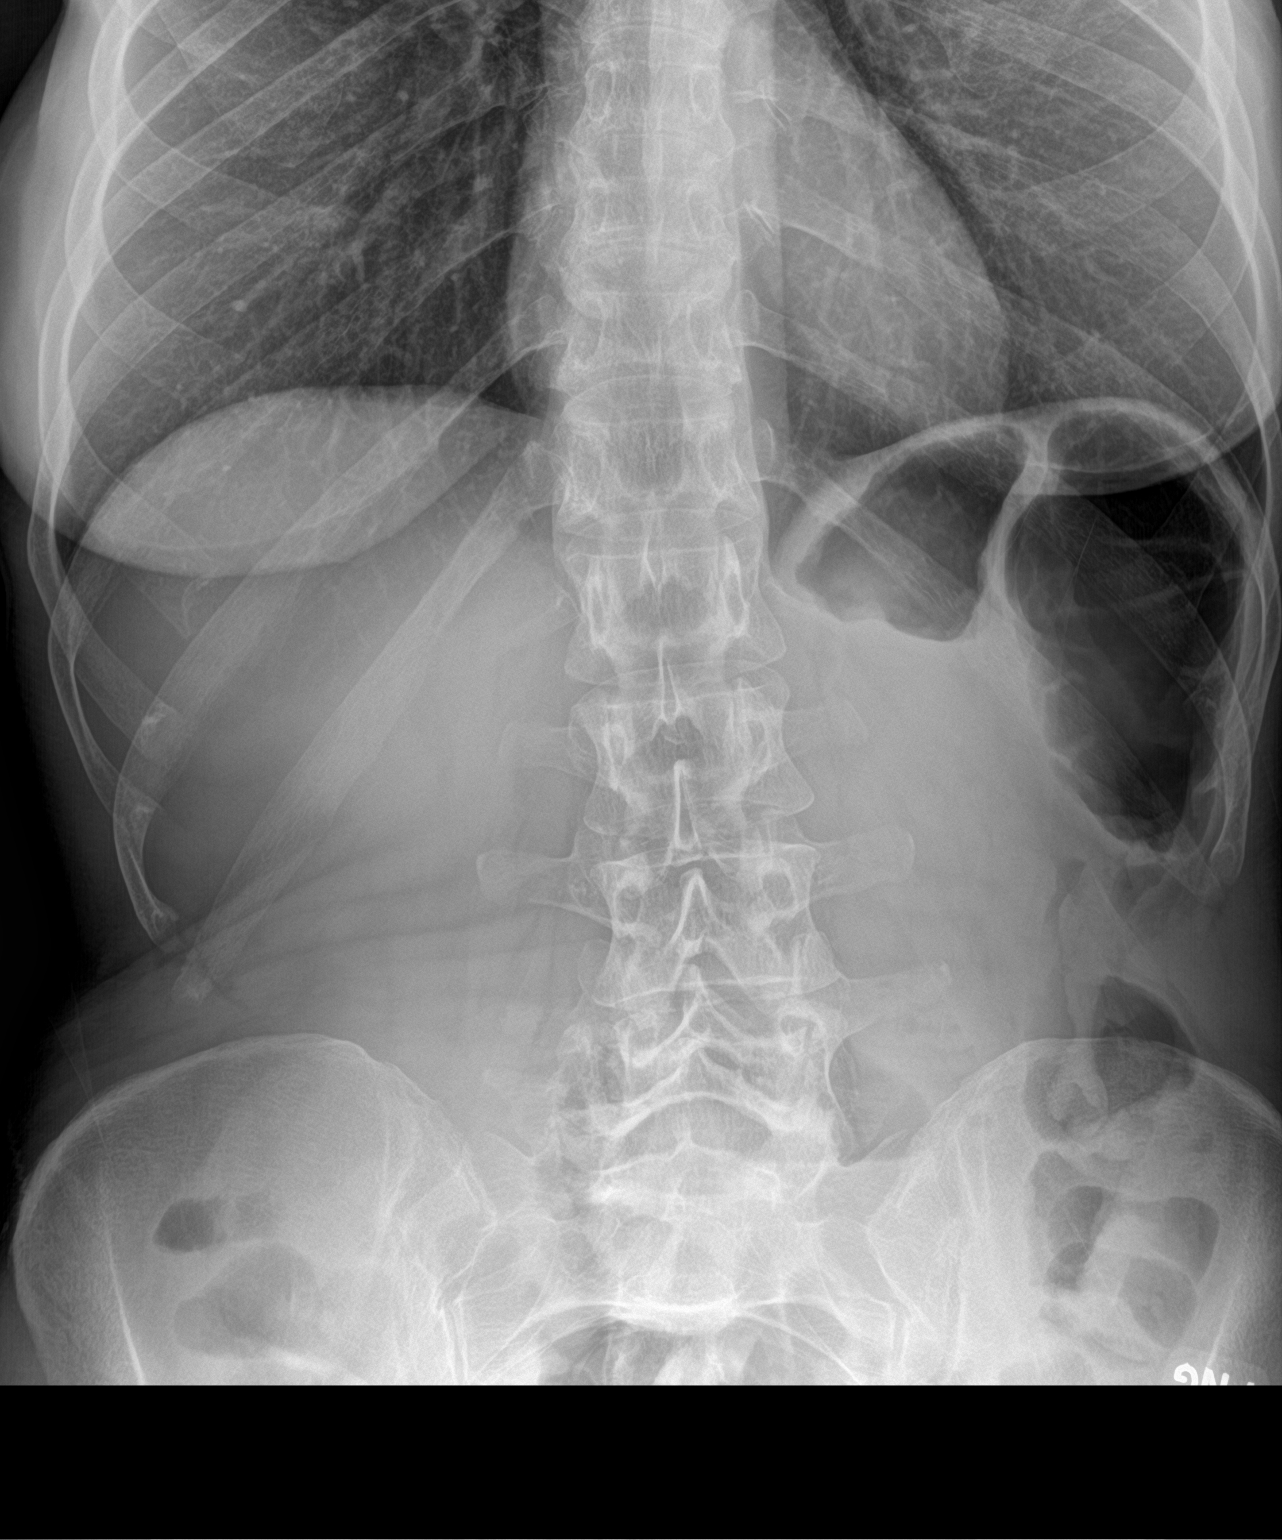

[abdomen supine]
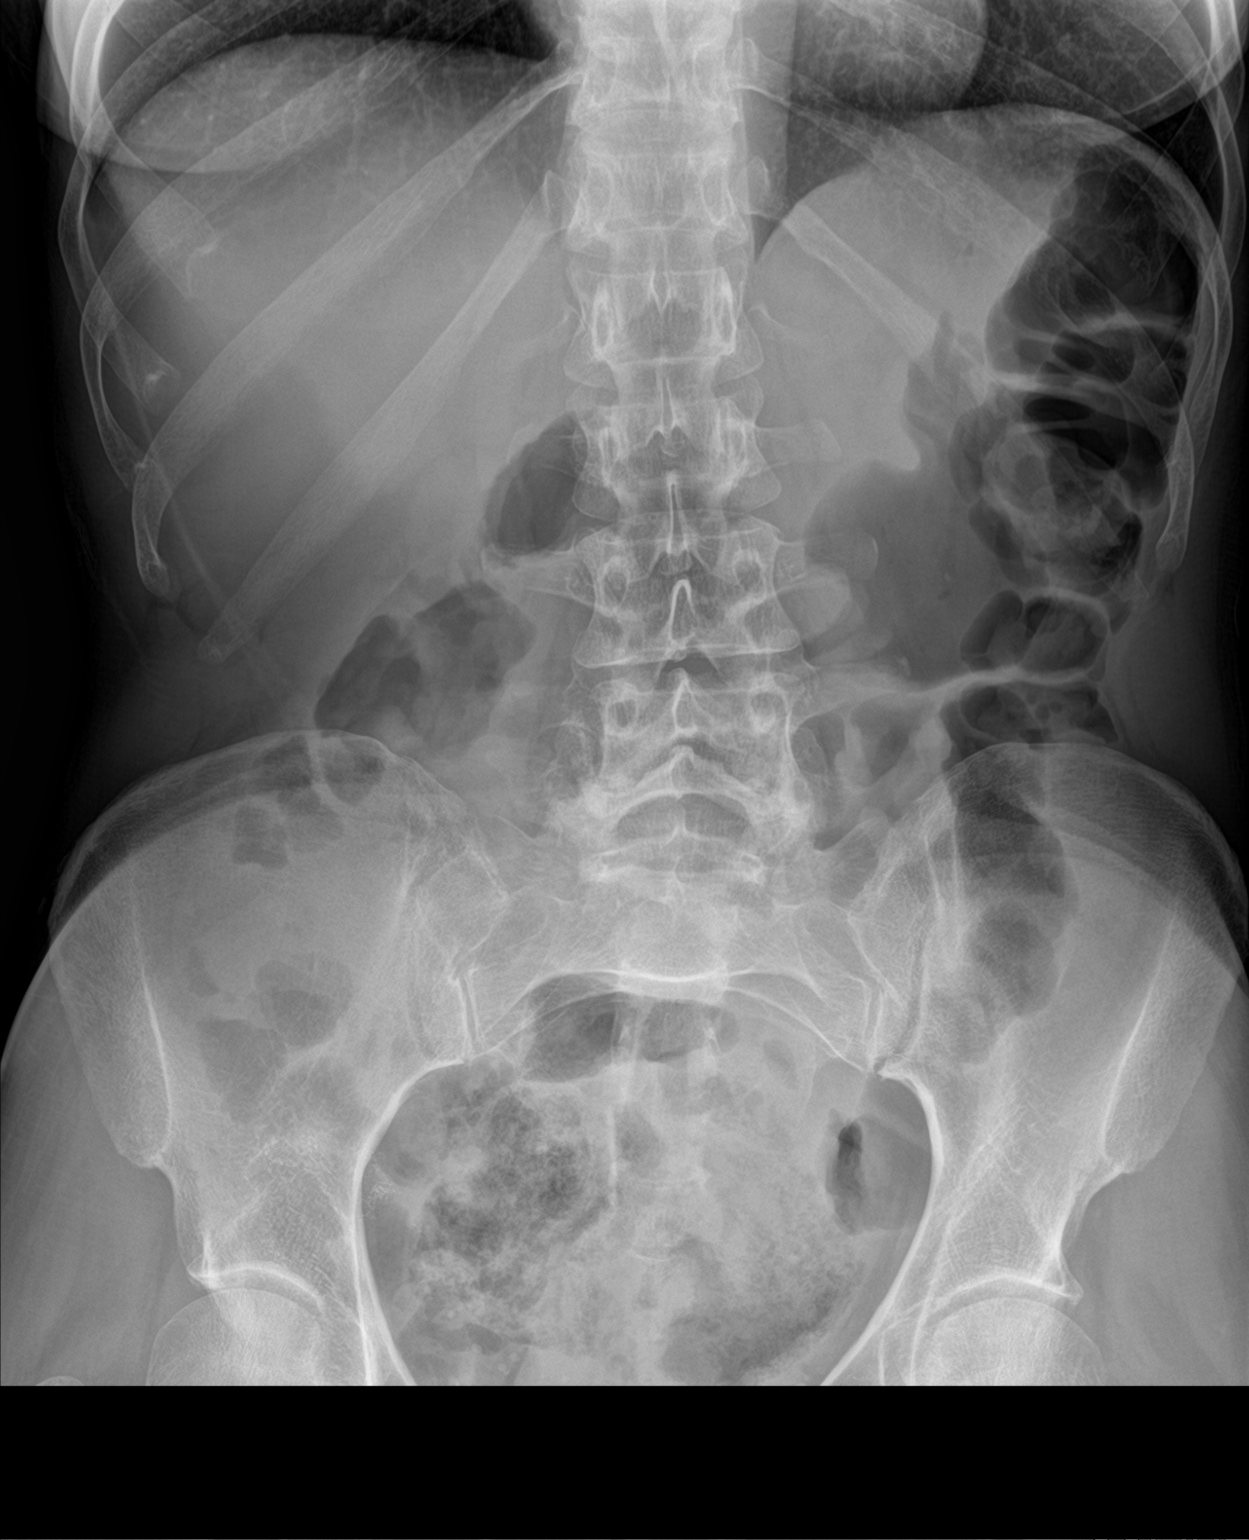

[2 of 2 positions shown; findings below may reference images not displayed]

FINDINGS: Scattered large and small bowel gas is noted. No obstructive changes
are seen. No free air is noted. No abnormal mass or abnormal
calcifications are noted.
IMPRESSION: No acute abnormality noted.

## 2019-06-30 ENCOUNTER — Ambulatory Visit: Payer: Federal, State, Local not specified - PPO | Admitting: Medical

## 2019-06-30 ENCOUNTER — Other Ambulatory Visit: Payer: Self-pay

## 2019-06-30 ENCOUNTER — Encounter: Payer: Self-pay | Admitting: Medical

## 2019-06-30 VITALS — BP 122/80 | HR 76 | Temp 98.0°F | Ht 65.0 in | Wt 115.0 lb

## 2019-06-30 DIAGNOSIS — R319 Hematuria, unspecified: Secondary | ICD-10-CM | POA: Diagnosis not present

## 2019-06-30 LAB — POCT URINALYSIS DIP (PROADVANTAGE DEVICE)
Bilirubin, UA: NEGATIVE
Glucose, UA: NEGATIVE mg/dL
Leukocytes, UA: NEGATIVE
Nitrite, UA: NEGATIVE
Protein Ur, POC: 30 mg/dL — AB
Specific Gravity, Urine: 1.025
Urobilinogen, Ur: NEGATIVE
pH, UA: 6 (ref 5.0–8.0)

## 2019-06-30 MED ORDER — SULFAMETHOXAZOLE-TRIMETHOPRIM 800-160 MG PO TABS: 1.0000 | ORAL_TABLET | Freq: Two times a day (BID) | ORAL | 0 refills | Status: DC

## 2019-06-30 NOTE — Progress Notes (Signed)
Subjective: Chief Complaint  Patient presents with  . Hematuria    only this morning    Here for complaint of hematuria.  Prior today was in usual state of health without complaint.  She notes this morning she had one episode of pink-reddish urine, blood on the toilet paper after urinating.  She has had some lower belly discomfort.  Has had some nausea.  Denies fever, no vomiting, no bowel issues, no blood back pain, no vaginal symptoms, no bulge in the vulvar area.  She notes last UTI was maybe 20 years ago.  She had a significant year last year with bowel obstruction and colectomy.  But in recent weeks no problems at all until today.  She denies urinary frequency, no burning with urination,.  She denies any change in hygiene products.  Bowels are regular.  She has history of hysterectomy total.  She does recall hematuria in past with UTI.  No other aggravating or relieving factors. No other complaint.  Past Medical History:  Diagnosis Date  . Anxiety   . Arthritis   . Cervical dysplasia 1990   CIN 2 subsequent cryosurgery. All Paps normal afterwards  . Dyslipidemia   . Herpes genitalis   . Herpes labialis   . Hypercholesteremia   . IBS (irritable bowel syndrome)   . Osteopenia 04/2014   T score -1.7 FRAX 7.3%/0.8% stable from prior DEXA 2013  . Raynaud disease    Current Outpatient Medications on File Prior to Visit  Medication Sig Dispense Refill  . b complex vitamins tablet Take 1 tablet by mouth daily.    . bisacodyl (DULCOLAX) 5 MG EC tablet Take 1 tablet (5 mg total) by mouth daily as needed for moderate constipation. 15 tablet 0  . cholecalciferol (VITAMIN D) 1000 UNITS tablet Take 1,000 Units by mouth daily.    Marland Kitchen docusate sodium (COLACE) 100 MG capsule Take 1 capsule (100 mg total) by mouth 2 (two) times daily. 30 capsule 0  . gabapentin (NEURONTIN) 600 MG tablet Take 1,200 mg by mouth 2 (two) times daily.     Marland Kitchen ibuprofen (ADVIL,MOTRIN) 800 MG tablet Take 1 tablet (800 mg  total) by mouth every 8 (eight) hours as needed. 60 tablet 1  . lamoTRIgine (LAMICTAL) 100 MG tablet Take 100 mg by mouth 2 (two) times daily.   0  . PROCTOZONE-HC 2.5 % rectal cream apply rectally twice a day (Patient taking differently: Apply rectally twice a day as needed for irritations) 30 g 1  . simvastatin (ZOCOR) 20 MG tablet TAKE 1 TABLET BY MOUTH EVERY DAY 30 tablet 1  . valACYclovir (VALTREX) 1000 MG tablet Take 1 tablet (1,000 mg total) by mouth 2 (two) times daily. 20 tablet 0  . vitamin C (ASCORBIC ACID) 500 MG tablet Take 500 mg by mouth daily.    . montelukast (SINGULAIR) 10 MG tablet Take 1 tablet (10 mg total) by mouth at bedtime. (Patient not taking: Reported on 06/30/2019) 30 tablet 3  . oxyCODONE (OXY IR/ROXICODONE) 5 MG immediate release tablet Take 1 tablet (5 mg total) by mouth every 6 (six) hours as needed for severe pain. (Patient not taking: Reported on 06/30/2019) 8 tablet 0  . traMADol (ULTRAM) 50 MG tablet Take 50 mg by mouth every 6 (six) hours as needed.    . Vitamin D, Ergocalciferol, (DRISDOL) 1.25 MG (50000 UT) CAPS capsule Take 1 capsule (50,000 Units total) by mouth every 7 (seven) days. (Patient not taking: Reported on 06/30/2019) 12 capsule 0  No current facility-administered medications on file prior to visit.    ROS as in subjective  Objective: BP 122/80   Pulse 76   Temp 98 F (36.7 C) (Oral)   Ht 5\' 5"  (1.651 m)   Wt 115 lb (52.2 kg)   SpO2 99%   BMI 19.14 kg/m   General appearence: alert, no distress, WD/WN,  Abdomen: +bs, soft, non tender, non distended, no masses, no hepatomegaly, no splenomegaly Back: nontender Pulses: 2+ symmetric, upper and lower extremities, normal cap refill     Assessment: Encounter Diagnosis  Name Primary?  . Hematuria, unspecified type Yes     Plan Discussed possible causes of hematuria.  Urine microscopic only showed a few red cells and nothing else.  We will send for urine culture.  We will treat  empirically for UTI with Bactrim below.  Rest, hydrate well, follow-up pending lab.  However if worse of the weekend with frank gross hematuria then may need to see the emergency department.  Bethany [name] was seen today for hematuria.  Diagnoses and all orders for this visit:  Hematuria, unspecified type -     Urine Culture -     POCT Urinalysis DIP (Proadvantage Device)  Other orders -     sulfamethoxazole-trimethoprim (BACTRIM DS) 800-160 MG tablet; Take 1 tablet by mouth 2 (two) times daily.

## 2019-07-01 LAB — URINE CULTURE: Organism ID, Bacteria: NO GROWTH

## 2019-07-26 DIAGNOSIS — F1421 Cocaine dependence, in remission: Secondary | ICD-10-CM | POA: Diagnosis not present

## 2019-07-26 DIAGNOSIS — F502 Bulimia nervosa: Secondary | ICD-10-CM | POA: Diagnosis not present

## 2019-07-26 DIAGNOSIS — F1521 Other stimulant dependence, in remission: Secondary | ICD-10-CM | POA: Diagnosis not present

## 2019-07-26 DIAGNOSIS — F411 Generalized anxiety disorder: Secondary | ICD-10-CM | POA: Diagnosis not present

## 2019-08-07 ENCOUNTER — Other Ambulatory Visit: Payer: Self-pay | Admitting: Family Medicine

## 2019-08-07 DIAGNOSIS — E785 Hyperlipidemia, unspecified: Secondary | ICD-10-CM

## 2019-08-09 ENCOUNTER — Other Ambulatory Visit: Payer: Federal, State, Local not specified - PPO

## 2019-08-16 ENCOUNTER — Other Ambulatory Visit (INDEPENDENT_AMBULATORY_CARE_PROVIDER_SITE_OTHER): Payer: Federal, State, Local not specified - PPO

## 2019-08-16 ENCOUNTER — Other Ambulatory Visit: Payer: Self-pay

## 2019-08-16 DIAGNOSIS — Z23 Encounter for immunization: Secondary | ICD-10-CM | POA: Diagnosis not present

## 2019-08-17 DIAGNOSIS — L812 Freckles: Secondary | ICD-10-CM | POA: Diagnosis not present

## 2019-08-17 DIAGNOSIS — L570 Actinic keratosis: Secondary | ICD-10-CM | POA: Diagnosis not present

## 2019-08-17 DIAGNOSIS — D1801 Hemangioma of skin and subcutaneous tissue: Secondary | ICD-10-CM | POA: Diagnosis not present

## 2019-08-17 DIAGNOSIS — D225 Melanocytic nevi of trunk: Secondary | ICD-10-CM | POA: Diagnosis not present

## 2019-08-17 DIAGNOSIS — Z85828 Personal history of other malignant neoplasm of skin: Secondary | ICD-10-CM | POA: Diagnosis not present

## 2019-08-23 DIAGNOSIS — Z9889 Other specified postprocedural states: Secondary | ICD-10-CM | POA: Diagnosis not present

## 2019-08-24 DIAGNOSIS — Z1231 Encounter for screening mammogram for malignant neoplasm of breast: Secondary | ICD-10-CM | POA: Diagnosis not present

## 2019-08-24 DIAGNOSIS — Z803 Family history of malignant neoplasm of breast: Secondary | ICD-10-CM | POA: Diagnosis not present

## 2019-08-24 LAB — HM MAMMOGRAPHY

## 2019-08-29 DIAGNOSIS — H04123 Dry eye syndrome of bilateral lacrimal glands: Secondary | ICD-10-CM | POA: Diagnosis not present

## 2019-08-31 DIAGNOSIS — M25561 Pain in right knee: Secondary | ICD-10-CM | POA: Diagnosis not present

## 2019-08-31 DIAGNOSIS — Z96651 Presence of right artificial knee joint: Secondary | ICD-10-CM | POA: Diagnosis not present

## 2019-09-04 DIAGNOSIS — Z96651 Presence of right artificial knee joint: Secondary | ICD-10-CM | POA: Diagnosis not present

## 2019-09-04 DIAGNOSIS — M25561 Pain in right knee: Secondary | ICD-10-CM | POA: Diagnosis not present

## 2019-09-12 DIAGNOSIS — F1521 Other stimulant dependence, in remission: Secondary | ICD-10-CM | POA: Diagnosis not present

## 2019-09-12 DIAGNOSIS — H04123 Dry eye syndrome of bilateral lacrimal glands: Secondary | ICD-10-CM | POA: Diagnosis not present

## 2019-09-12 DIAGNOSIS — F1421 Cocaine dependence, in remission: Secondary | ICD-10-CM | POA: Diagnosis not present

## 2019-09-12 DIAGNOSIS — F502 Bulimia nervosa: Secondary | ICD-10-CM | POA: Diagnosis not present

## 2019-09-12 DIAGNOSIS — F411 Generalized anxiety disorder: Secondary | ICD-10-CM | POA: Diagnosis not present

## 2019-09-21 ENCOUNTER — Telehealth: Payer: Self-pay

## 2019-09-21 NOTE — Telephone Encounter (Signed)
Left message for patient to call back to schedule visit with Dr. Tamala Julian.

## 2019-10-04 ENCOUNTER — Other Ambulatory Visit: Payer: Self-pay | Admitting: Family Medicine

## 2019-10-04 DIAGNOSIS — E785 Hyperlipidemia, unspecified: Secondary | ICD-10-CM

## 2019-10-19 NOTE — Progress Notes (Signed)
[name] [address], [place] [address] Phone: [phone] Subjective:   Bethany [name], am serving as a scribe for Dr. Hulan Saas. This visit occurred during the SARS-CoV-2 public health emergency.  Safety protocols were in place, including screening questions prior to the visit, additional usage of staff PPE, and extensive cleaning of exam room while observing appropriate contact time as indicated for disinfecting solutions.    CC: Multiple complaints  [id number]   02/14/2019 Discussed with patient in great length.  Patient states that he is not making any significant improvement in since patient had the knee replacement.  Feels like he is unable to move it.  Did have the manipulation of the knee one time with [name] improvement either.  We have attempted Singulair for any type of allergic response that did not seem to be beneficial.  Have even tried colchicine for any type of gout which also did not seem to be improving.  Patient feels that there is some increasing instability and I do feel at this time we should consider the possibility of a bone scan.  Discussed a low-dose MRI but I do think for the knee and may cause too much of an artifact.  Patient will be set up for the bone scan and if we see any significant loosening will discuss with patient's orthopedic surgeon.  Spent  25 minutes with patient face-to-face and had greater than 50% of counseling including as described above in assessment and plan.  Update 10/19/2019 Bethany [name] is a 63 y.o. female coming in with complaint of right knee pain, right hip, left shoulder and lower back pain.  Pain over hip flexor on right side. Continues to have knee pain with deep squatting and stairs.  Noticing tightness.  Past medical history is significant for sepsis plus facet arthropathy of the lower back.  Patient thinks that could be contributing to both the hip and the back pain.  Pain in anterior  shoulder for 2 weeks. Doing classes at home and may have injured it during class. Denies any numbness or tingling.  Waking her up at night, has been working on regular basis and feels that this could have contributed.  Does not remember 1 specific injury  Also having lower back pain that seems to be increasing over time. Pain increases with standing.  As stated above the patient does have a history of facet arthropathy.     Past Medical History:  Diagnosis Date  . Anxiety   . Arthritis   . Cervical dysplasia 1990   CIN 2 subsequent cryosurgery. All Paps normal afterwards  . Dyslipidemia   . Herpes genitalis   . Herpes labialis   . Hypercholesteremia   . IBS (irritable bowel syndrome)   . Osteopenia 04/2014   T score -1.7 FRAX 7.3%/0.8% stable from prior DEXA 2013  . Raynaud disease    Past Surgical History:  Procedure Laterality Date  . AUGMENTATION MAMMAPLASTY    . BREAST ENHANCEMENT SURGERY  2006  . COSMETIC SURGERY    . EYE SURGERY     lasik od  . GYNECOLOGIC CRYOSURGERY  1990  . LAPAROTOMY N/A 04/22/2018   Procedure: EXPLORATORY LAPAROTOMY;  Surgeon: Erroll Luna, MD;  Location: [facility];  Service: General;  Laterality: N/A;  . LIGAMENT REPAIR Right 09/24/2015   Procedure: RIGHT INDEX METACARPAL PHALANGEAL RADIAL COLLATERAL LIGAMENT REPAIR;  Surgeon: Leanora Cover, MD;  Location: [facility];  Service: Orthopedics;  Laterality: Right;  .  MASS EXCISION N/A 08/04/2018   Procedure: REMOVAL OF SUTURE FROM ABDOMEN/ERAS PATHWAY;  Surgeon: Erroll Luna, MD;  Location: [facility];  Service: General;  Laterality: N/A;  . OOPHORECTOMY     BSO  . Right knee arthoscopy Right 09/09/2017   [facility]  . SKIN BIOPSY Right 02/28/2019   Superficial basal carcinoma   . TONSILLECTOMY AND ADENOIDECTOMY    . TOTAL KNEE ARTHROPLASTY Right 04/12/2018  . TOTAL KNEE ARTHROPLASTY Right 04/12/2018   Procedure: RIGHT TOTAL KNEE ARTHROPLASTY;  Surgeon: Melrose Nakayama, MD;  Location: [facility];  Service: Orthopedics;  Laterality: Right;  . TUBAL LIGATION    . UPPER GASTROINTESTINAL ENDOSCOPY  04/24/05  . VAGINAL HYSTERECTOMY  2005   [facility] BSO  adenomyosis   Social History   Socioeconomic History  . Marital status: Married    Spouse name: Not on file  . Number of children: Not on file  . Years of education: Not on file  . Highest education level: Not on file  Occupational History  . Not on file  Tobacco Use  . Smoking status: Former Smoker    Quit date: 03/21/2006    Years since quitting: 13.5  . Smokeless tobacco: Never Used  Substance and Sexual Activity  . Alcohol use: [name]    Alcohol/week: 0.0 standard drinks    Comment: drinking in 1993  . Drug use: [name]  . Sexual activity: Yes    Birth control/protection: Surgical    Comment: HYST-1st intercourse 63 yo-More than 5 partners  Other Topics Concern  . Not on file  Social History Narrative  . Not on file   Social Determinants of Health   Financial Resource Strain:   . Difficulty of Paying Living Expenses: Not on file  Food Insecurity:   . Worried About Charity fundraiser in the Last Year: Not on file  . Ran Out of Food in the Last Year: Not on file  Transportation Needs:   . Lack of Transportation (Medical): Not on file  . Lack of Transportation (Non-Medical): Not on file  Physical Activity:   . Days of Exercise per Week: Not on file  . Minutes of Exercise per Session: Not on file  Stress:   . Feeling of Stress : Not on file  Social Connections:   . Frequency of Communication with Friends and Family: Not on file  . Frequency of Social Gatherings with Friends and Family: Not on file  . Attends Religious Services: Not on file  . Active Member of Clubs or Organizations: Not on file  . Attends Archivist Meetings: Not on file  . Marital Status: Not on file   Allergies  Allergen Reactions  . Morphine And Related Nausea Only   Family History  Problem Relation Age of  Onset  . Hypertension Mother   . Cancer Father 96       Lymphoma  . Breast cancer Sister 35  . Colon cancer Neg Hx      Current Outpatient Medications (Cardiovascular):  .  simvastatin (ZOCOR) 20 MG tablet, TAKE 1 TABLET BY MOUTH EVERY DAY  Current Outpatient Medications (Respiratory):  .  montelukast (SINGULAIR) 10 MG tablet, Take 1 tablet (10 mg total) by mouth at bedtime.  Current Outpatient Medications (Analgesics):  .  ibuprofen (ADVIL,MOTRIN) 800 MG tablet, Take 1 tablet (800 mg total) by mouth every 8 (eight) hours as needed. Marland Kitchen  oxyCODONE (OXY IR/ROXICODONE) 5 MG immediate release tablet, Take 1 tablet (5 mg total)  by mouth every 6 (six) hours as needed for severe pain. .  traMADol (ULTRAM) 50 MG tablet, Take 50 mg by mouth every 6 (six) hours as needed. .  meloxicam (MOBIC) 15 MG tablet, Take 1 tablet (15 mg total) by mouth daily.   Current Outpatient Medications (Other):  .  b complex vitamins tablet, Take 1 tablet by mouth daily. .  bisacodyl (DULCOLAX) 5 MG EC tablet, Take 1 tablet (5 mg total) by mouth daily as needed for moderate constipation. .  cholecalciferol (VITAMIN D) 1000 UNITS tablet, Take 1,000 Units by mouth daily. Marland Kitchen  docusate sodium (COLACE) 100 MG capsule, Take 1 capsule (100 mg total) by mouth 2 (two) times daily. Marland Kitchen  gabapentin (NEURONTIN) 600 MG tablet, Take 1,200 mg by mouth 2 (two) times daily.  Marland Kitchen  lamoTRIgine (LAMICTAL) 100 MG tablet, Take 100 mg by mouth 2 (two) times daily.  Marland Kitchen  PROCTOZONE-HC 2.5 % rectal cream, apply rectally twice a day (Patient taking differently: Apply rectally twice a day as needed for irritations) .  sulfamethoxazole-trimethoprim (BACTRIM DS) 800-160 MG tablet, Take 1 tablet by mouth 2 (two) times daily. .  valACYclovir (VALTREX) 1000 MG tablet, Take 1 tablet (1,000 mg total) by mouth 2 (two) times daily. .  vitamin C (ASCORBIC ACID) 500 MG tablet, Take 500 mg by mouth daily. .  Vitamin D, Ergocalciferol, (DRISDOL) 1.25 MG (50000  UT) CAPS capsule, Take 1 capsule (50,000 Units total) by mouth every 7 (seven) days.    Past medical history, social, surgical and family history all reviewed in electronic medical record.  [name]   Review of Systems:  [name], visual changes, nausea, vomiting, diarrhea, constipation, dizziness, abdominal pain, skin rash, fevers, chills, night sweats, weight loss, swollen lymph nodes, body aches, joint swelling, muscle aches, chest pain, shortness of breath, mood changes.   Objective  Blood pressure 110/72, height 5\' 5"  (1.651 m), weight 117 lb (53.1 kg). Systems examined below as of    General: [name], dressed appropriately.  HEENT: Pupils equal, extraocular movements intact  Respiratory: Patient's speak in full sentences and does not appear short of breath  Cardiovascular: [name], non tender, [name]  Skin: Warm dry intact with [name] signs of infection or rash on extremities or on axial skeleton.  Abdomen: Soft nontender  Neuro: Cranial nerves II through XII are intact, neurovascularly intact in all extremities with 2+ DTRs and 2+ pulses.  Lymph: [name] lymphadenopathy of posterior or anterior cervical chain or axillae bilaterally.  Gait antalgic MSK:   Patient's right knee replacement still shows swelling noted.  Only has 85 degrees of flexion.  Patient is minimally tender in the soft tissue surrounding the area.  [name] real true instability noted.  Left shoulder exam shows a positive Spurling's.  Mild positive impingement.  [name] significant atrophy of the muscle surrounding it.  Rotator cuff strength appears to be intact.  Low back exam shows some mild degenerative scoliosis.  Tightness of the hip flexors bilaterally with pain over the right sacroiliac joint.  Positive Corky Sox.  Negative straight leg test.  Worsening pain with extension greater than  10 degrees of the back.  Neurovascularly intact distally with 5 out of 5 strength  Limited musculoskeletal ultrasound was performed and interpreted by Lyndal Pulley   Limited ultrasound of patient's left shoulder shows the patient does have significant hypoechoic changes within the  bicep tendon with possible intrasubstance tearing noted.  [name] retraction at any point.  Mild increase in Doppler flow.  Rotator cuff appears to be intact.  Osteopathic findings T9 extended rotated and side bent left L2 flexed rotated and side bent right L4 flexed rotated and side bent right  sacrum right on right   [place] and Recommendations:     This case required medical decision making of moderate complexity. The above documentation has been reviewed and is accurate and complete Lyndal Pulley, DO       Note: This dictation was prepared with Dragon dictation along with smaller phrase technology. Any transcriptional errors that result from this process are unintentional.

## 2019-10-20 ENCOUNTER — Ambulatory Visit (INDEPENDENT_AMBULATORY_CARE_PROVIDER_SITE_OTHER): Payer: Federal, State, Local not specified - PPO | Admitting: Family Medicine

## 2019-10-20 ENCOUNTER — Ambulatory Visit (INDEPENDENT_AMBULATORY_CARE_PROVIDER_SITE_OTHER)
Admission: RE | Admit: 2019-10-20 | Discharge: 2019-10-20 | Disposition: A | Discharge status: Home / Self Care | Payer: Federal, State, Local not specified - PPO | Source: Ambulatory Visit | Attending: Family Medicine | Admitting: Family Medicine

## 2019-10-20 ENCOUNTER — Other Ambulatory Visit: Payer: Self-pay

## 2019-10-20 VITALS — BP 110/72 | Ht 65.0 in | Wt 117.0 lb

## 2019-10-20 DIAGNOSIS — M545 Low back pain, unspecified: Secondary | ICD-10-CM

## 2019-10-20 DIAGNOSIS — S46212A Strain of muscle, fascia and tendon of other parts of biceps, left arm, initial encounter: Secondary | ICD-10-CM

## 2019-10-20 DIAGNOSIS — T8484XA Pain due to internal orthopedic prosthetic devices, implants and grafts, initial encounter: Secondary | ICD-10-CM

## 2019-10-20 DIAGNOSIS — Z96659 Presence of unspecified artificial knee joint: Secondary | ICD-10-CM

## 2019-10-20 DIAGNOSIS — M999 Biomechanical lesion, unspecified: Secondary | ICD-10-CM | POA: Diagnosis not present

## 2019-10-20 DIAGNOSIS — M47816 Spondylosis without myelopathy or radiculopathy, lumbar region: Secondary | ICD-10-CM | POA: Diagnosis not present

## 2019-10-20 MED ORDER — MELOXICAM 15 MG PO TABS: 15.0000 mg | ORAL_TABLET | Freq: Every day | ORAL | 0 refills | Status: DC

## 2019-10-20 NOTE — Assessment & Plan Note (Signed)
Decision today to treat with OMT was based on Physical Exam  After verbal consent patient was treated with HVLA, ME, FPR techniques in cervical, thoracic, lumbar and sacral areas  Patient tolerated the procedure well with improvement in symptoms  Patient given exercises, stretches and lifestyle modifications  See medications in patient instructions if given  Patient will follow up in 4-8 weeks 

## 2019-10-20 NOTE — Patient Instructions (Addendum)
  [address]st [address] [place], [place] [address] Phone [phone]  Shoulder exercises 3x a week Back xray Pennsaid 2x a day Tart cherry extract 1200 mg at night Follow up in 5-6 weeks

## 2019-10-20 NOTE — Assessment & Plan Note (Signed)
Patient will be getting a second and third opinion in the near future.  I do think that patient is likely looking potential in vision but I did was concerned that the scar tissue would reform him need to discuss continue movement and other things.  Patient will follow up with the other providers for this problem.

## 2019-10-20 NOTE — Assessment & Plan Note (Signed)
Nontraumatic, discussed icing regimen and home exercise, discussed which activities to do which wants to avoid.  Patient is to increase activity slowly over the course the next several weeks.  Patient is to do compression and avoid heavy lifting.  Follow-up again in 4 to 6 weeks

## 2019-10-21 ENCOUNTER — Encounter: Payer: Self-pay | Admitting: Family Medicine

## 2019-10-24 ENCOUNTER — Ambulatory Visit: Payer: Federal, State, Local not specified - PPO | Admitting: Family Medicine

## 2019-10-31 ENCOUNTER — Encounter: Payer: Self-pay | Admitting: Family Medicine

## 2019-10-31 ENCOUNTER — Ambulatory Visit (INDEPENDENT_AMBULATORY_CARE_PROVIDER_SITE_OTHER): Payer: Federal, State, Local not specified - PPO | Admitting: Family Medicine

## 2019-10-31 ENCOUNTER — Other Ambulatory Visit: Payer: Self-pay

## 2019-10-31 VITALS — BP 120/76 | HR 67 | Temp 97.1°F | Ht 64.25 in | Wt 116.4 lb

## 2019-10-31 DIAGNOSIS — F39 Unspecified mood [affective] disorder: Secondary | ICD-10-CM | POA: Diagnosis not present

## 2019-10-31 DIAGNOSIS — Z0000 Encounter for general adult medical examination without abnormal findings: Secondary | ICD-10-CM | POA: Diagnosis not present

## 2019-10-31 DIAGNOSIS — S6991XA Unspecified injury of right wrist, hand and finger(s), initial encounter: Secondary | ICD-10-CM | POA: Insufficient documentation

## 2019-10-31 DIAGNOSIS — E785 Hyperlipidemia, unspecified: Secondary | ICD-10-CM | POA: Diagnosis not present

## 2019-10-31 DIAGNOSIS — D126 Benign neoplasm of colon, unspecified: Secondary | ICD-10-CM

## 2019-10-31 DIAGNOSIS — Z96651 Presence of right artificial knee joint: Secondary | ICD-10-CM

## 2019-10-31 HISTORY — DX: Presence of right artificial knee joint: Z96.651

## 2019-10-31 LAB — POCT URINALYSIS DIP (PROADVANTAGE DEVICE)
Bilirubin, UA: NEGATIVE
Blood, UA: NEGATIVE
Glucose, UA: NEGATIVE mg/dL
Ketones, POC UA: NEGATIVE mg/dL
Leukocytes, UA: NEGATIVE
Nitrite, UA: NEGATIVE
Protein Ur, POC: 30 mg/dL — AB
Specific Gravity, Urine: 1.015
Urobilinogen, Ur: 0.2
pH, UA: 7.5 (ref 5.0–8.0)

## 2019-10-31 MED ORDER — SIMVASTATIN 20 MG PO TABS: 20.0000 mg | ORAL_TABLET | Freq: Every day | ORAL | 3 refills | Status: DC

## 2019-10-31 NOTE — Progress Notes (Signed)
   Subjective:    Patient ID: Bethany [name], female    DOB: 11/18/1955, 63 y.o.   MRN: [id number]  HPI She is here for complete examination.  She continues have difficulty with her right knee.  She has had TKR and has had difficulty with pain as well as function of that knee for quite some time.  She has had 2 manipulations as well as an arthroscopy to help with the scar tissue.  She plans to see an orthopedic surgeon at [facility] for second opinion about possibly having a replacement.  She also has a history of colonic polyps and is on a routine 5-year schedule for that.  Her mood disorder is being handled by Dr. Edgar Frisk and she is quite stable on the present medications.  She continues on her statin and is having no aches or pains with that.  She is now unemployed but is enjoying her free time.  She does plan to go back to work but part-time.  Her marriage is stable.  She has no other concerns or complaints.  Family and social history as well as health maintenance and immunizations was reviewed   Review of Systems  All other systems reviewed and are negative.      Objective:   Physical Exam Alert and in no distress. Tympanic membranes and canals are normal. Pharyngeal area is normal. Neck is supple without adenopathy or thyromegaly. Cardiac exam shows a regular sinus rhythm without murmurs or gallops. Lungs are clear to auscultation. Abdominal exam shows normal bowel sounds without hepatosplenomegaly.       Assessment & Plan:  Routine general medical examination at a health care facility - Plan: CBC with Differential, Comprehensive metabolic panel, Lipid panel  Mood disorder (HCC)  Hyperlipidemia with target LDL less than 100 - Plan: Lipid panel, simvastatin (ZOCOR) 20 MG tablet  Tubular adenoma of colon  Status post total right knee replacement Encouraged her to continue to take good care of herself.

## 2019-10-31 NOTE — Addendum Note (Signed)
Addended by: Elyse Jarvis on: 10/31/2019 02:19 PM   Modules accepted: Orders

## 2019-11-01 ENCOUNTER — Ambulatory Visit: Payer: Federal, State, Local not specified - PPO | Admitting: Family Medicine

## 2019-11-01 LAB — LIPID PANEL
Chol/HDL Ratio: 1.9 ratio (ref 0.0–4.4)
Cholesterol, Total: 225 mg/dL — ABNORMAL HIGH (ref 100–199)
HDL: 121 mg/dL (ref >39)
LDL Chol Calc (NIH): 96 mg/dL (ref 0–99)
Triglycerides: 47 mg/dL (ref 0–149)
VLDL Cholesterol Cal: 8 mg/dL (ref 5–40)

## 2019-11-01 LAB — CBC WITH DIFFERENTIAL/PLATELET
Basophils Absolute: 0.1 10*3/uL (ref 0.0–0.2)
Basos: 1 %
EOS (ABSOLUTE): 0.1 10*3/uL (ref 0.0–0.4)
Eos: 1 %
Hematocrit: 37.2 % (ref 34.0–46.6)
Hemoglobin: 13.2 g/dL (ref 11.1–15.9)
Immature Grans (Abs): 0 10*3/uL (ref 0.0–0.1)
Immature Granulocytes: 0 %
Lymphocytes Absolute: 1.8 10*3/uL (ref 0.7–3.1)
Lymphs: 26 %
MCH: 34.3 pg — ABNORMAL HIGH (ref 26.6–33.0)
MCHC: 35.5 g/dL (ref 31.5–35.7)
MCV: 97 fL (ref 79–97)
Monocytes Absolute: 0.5 10*3/uL (ref 0.1–0.9)
Monocytes: 7 %
Neutrophils Absolute: 4.3 10*3/uL (ref 1.4–7.0)
Neutrophils: 65 %
Platelets: 196 10*3/uL (ref 150–450)
RBC: 3.85 x10E6/uL (ref 3.77–5.28)
RDW: 12.3 % (ref 11.7–15.4)
WBC: 6.7 10*3/uL (ref 3.4–10.8)

## 2019-11-01 LAB — COMPREHENSIVE METABOLIC PANEL
ALT: 34 IU/L — ABNORMAL HIGH (ref 0–32)
AST: 48 IU/L — ABNORMAL HIGH (ref 0–40)
Albumin/Globulin Ratio: 2.6 — ABNORMAL HIGH (ref 1.2–2.2)
Albumin: 4.9 g/dL — ABNORMAL HIGH (ref 3.8–4.8)
Alkaline Phosphatase: 77 IU/L (ref 39–117)
BUN/Creatinine Ratio: 15 (ref 12–28)
BUN: 17 mg/dL (ref 8–27)
Bilirubin Total: 0.2 mg/dL (ref 0.0–1.2)
CO2: 28 mmol/L (ref 20–29)
Calcium: 9.9 mg/dL (ref 8.7–10.3)
Chloride: 100 mmol/L (ref 96–106)
Creatinine, Ser: 1.11 mg/dL — ABNORMAL HIGH (ref 0.57–1.00)
GFR calc Af Amer: 61 mL/min/{1.73_m2} (ref >59)
GFR calc non Af Amer: 53 mL/min/{1.73_m2} — ABNORMAL LOW (ref >59)
Globulin, Total: 1.9 g/dL (ref 1.5–4.5)
Glucose: 82 mg/dL (ref 65–99)
Potassium: 5.1 mmol/L (ref 3.5–5.2)
Sodium: 141 mmol/L (ref 134–144)
Total Protein: 6.8 g/dL (ref 6.0–8.5)

## 2019-11-24 ENCOUNTER — Ambulatory Visit: Payer: Federal, State, Local not specified - PPO | Admitting: Family Medicine

## 2019-12-03 ENCOUNTER — Encounter: Payer: Self-pay | Admitting: Family Medicine

## 2019-12-07 MED ORDER — TERBINAFINE HCL 250 MG PO TABS: 250.0000 mg | ORAL_TABLET | Freq: Every day | ORAL | 0 refills | Status: DC

## 2019-12-20 ENCOUNTER — Other Ambulatory Visit: Payer: Self-pay | Admitting: Orthopaedic Surgery

## 2019-12-20 DIAGNOSIS — M25561 Pain in right knee: Secondary | ICD-10-CM | POA: Diagnosis not present

## 2019-12-20 DIAGNOSIS — Z96651 Presence of right artificial knee joint: Secondary | ICD-10-CM

## 2019-12-20 DIAGNOSIS — Z96659 Presence of unspecified artificial knee joint: Secondary | ICD-10-CM | POA: Diagnosis not present

## 2019-12-20 DIAGNOSIS — T8489XA Other specified complication of internal orthopedic prosthetic devices, implants and grafts, initial encounter: Secondary | ICD-10-CM | POA: Diagnosis not present

## 2019-12-20 DIAGNOSIS — T84018A Broken internal joint prosthesis, other site, initial encounter: Secondary | ICD-10-CM | POA: Diagnosis not present

## 2019-12-20 DIAGNOSIS — T8484XA Pain due to internal orthopedic prosthetic devices, implants and grafts, initial encounter: Secondary | ICD-10-CM | POA: Diagnosis not present

## 2019-12-20 DIAGNOSIS — Y838 Other surgical procedures as the cause of abnormal reaction of the patient, or of later complication, without mention of misadventure at the time of the procedure: Secondary | ICD-10-CM | POA: Diagnosis not present

## 2019-12-26 ENCOUNTER — Other Ambulatory Visit: Payer: Self-pay

## 2019-12-26 ENCOUNTER — Ambulatory Visit
Admission: RE | Admit: 2019-12-26 | Discharge: 2019-12-26 | Disposition: A | Discharge status: Home / Self Care | Payer: Federal, State, Local not specified - PPO | Source: Ambulatory Visit | Attending: Orthopaedic Surgery | Admitting: Orthopaedic Surgery

## 2019-12-26 DIAGNOSIS — Z96651 Presence of right artificial knee joint: Secondary | ICD-10-CM

## 2019-12-26 DIAGNOSIS — M25461 Effusion, right knee: Secondary | ICD-10-CM | POA: Diagnosis not present

## 2019-12-26 DIAGNOSIS — Z471 Aftercare following joint replacement surgery: Secondary | ICD-10-CM | POA: Diagnosis not present

## 2020-01-05 ENCOUNTER — Ambulatory Visit: Payer: Federal, State, Local not specified - PPO | Admitting: Family Medicine

## 2020-01-05 ENCOUNTER — Encounter: Payer: Self-pay | Admitting: Family Medicine

## 2020-01-05 ENCOUNTER — Other Ambulatory Visit: Payer: Self-pay

## 2020-01-05 VITALS — BP 122/80 | HR 68 | Temp 97.5°F | Wt 117.4 lb

## 2020-01-05 DIAGNOSIS — F39 Unspecified mood [affective] disorder: Secondary | ICD-10-CM | POA: Diagnosis not present

## 2020-01-05 DIAGNOSIS — Z0000 Encounter for general adult medical examination without abnormal findings: Secondary | ICD-10-CM

## 2020-01-05 DIAGNOSIS — F419 Anxiety disorder, unspecified: Secondary | ICD-10-CM

## 2020-01-05 NOTE — Progress Notes (Signed)
   Subjective:    Patient ID: Bethany [name], female    DOB: 03/02/1956, 64 y.o.   MRN: [id number]  HPI She is here for evaluation of a feeling of a lump in the right side of her neck that she thinks is getting bigger.  She has had no fever, chills, sore throat, cough or congestion. She also has noted more difficulty in the last several months with anxiety.  She is involved in counseling and presently on Lamictal as well as gabapentin and seems to be doing fairly well on that.   Review of Systems     Objective:   Physical Exam Alert and in no distress.  Exam of the neck shows no lymphadenopathy or thyromegaly..  Carotid notch is palpable and nontender.  She points to that area as to where she is having her concern.  Throat is clear.       Assessment & Plan:  Normal neck exam  Anxiety  Mood disorder ([place]) I explained that the anatomy she is feeling is the carotid notch and is normal.  Reassured her that I saw no other areas of concern.  She was comfortable with that. We then discussed the anxiety she is under.  She has had to deal with Covid, the loss of her job.  She does exercise regularly and does find that useful.  Discussed the possible use of BuSpar and I recommend that she discuss this further with her therapist.

## 2020-01-12 DIAGNOSIS — T8489XD Other specified complication of internal orthopedic prosthetic devices, implants and grafts, subsequent encounter: Secondary | ICD-10-CM | POA: Diagnosis not present

## 2020-01-12 DIAGNOSIS — Z96651 Presence of right artificial knee joint: Secondary | ICD-10-CM | POA: Diagnosis not present

## 2020-01-12 DIAGNOSIS — T8484XD Pain due to internal orthopedic prosthetic devices, implants and grafts, subsequent encounter: Secondary | ICD-10-CM | POA: Diagnosis not present

## 2020-01-12 DIAGNOSIS — M25669 Stiffness of unspecified knee, not elsewhere classified: Secondary | ICD-10-CM | POA: Diagnosis not present

## 2020-01-18 ENCOUNTER — Other Ambulatory Visit: Payer: Self-pay

## 2020-01-18 ENCOUNTER — Ambulatory Visit (INDEPENDENT_AMBULATORY_CARE_PROVIDER_SITE_OTHER): Payer: Federal, State, Local not specified - PPO

## 2020-01-18 ENCOUNTER — Encounter: Payer: Self-pay | Admitting: Family Medicine

## 2020-01-18 ENCOUNTER — Ambulatory Visit: Payer: Federal, State, Local not specified - PPO | Admitting: Family Medicine

## 2020-01-18 VITALS — BP 118/80 | Ht 64.0 in | Wt 117.0 lb

## 2020-01-18 DIAGNOSIS — M1812 Unilateral primary osteoarthritis of first carpometacarpal joint, left hand: Secondary | ICD-10-CM

## 2020-01-18 DIAGNOSIS — G8929 Other chronic pain: Secondary | ICD-10-CM

## 2020-01-18 DIAGNOSIS — M999 Biomechanical lesion, unspecified: Secondary | ICD-10-CM

## 2020-01-18 DIAGNOSIS — M79645 Pain in left finger(s): Secondary | ICD-10-CM

## 2020-01-18 HISTORY — DX: Unilateral primary osteoarthritis of first carpometacarpal joint, left hand: M18.12

## 2020-01-18 NOTE — Assessment & Plan Note (Signed)
Decision today to treat with OMT was based on Physical Exam  After verbal consent patient was treated with HVLA, ME, FPR techniques in cervical, thoracic, rib,  areas  Patient tolerated the procedure well with improvement in symptoms  Patient given exercises, stretches and lifestyle modifications  See medications in patient instructions if given  Patient will follow up in 4-8 weeks 

## 2020-01-18 NOTE — Assessment & Plan Note (Signed)
New problem, given injection today, moderate arthritic changes.  Patient of course wants to avoid any type of surgical intervention.  Discussed home exercise, icing regimen, which activities to which wants to avoid.  Patient will increase activity as tolerated.  Follow-up again in 4 to 8 weeks

## 2020-01-18 NOTE — Progress Notes (Signed)
[place] Phone: [phone] Subjective:   Bethany [name], am serving as a scribe for Dr. Hulan Saas. This visit occurred during the SARS-CoV-2 public health emergency.  Safety protocols were in place, including screening questions prior to the visit, additional usage of staff PPE, and extensive cleaning of exam room while observing appropriate contact time as indicated for disinfecting solutions.   I'm seeing this patient by the request  of:  Denita Lung, MD  CC: Left hand pain, back pain  [id number]  Bethany [name] is a 64 y.o. female coming in with complaint of left hand pain. Pain has become progressively worse over the year. Pain with grasping. Pain in [facility] joint.  Patient states that daily activities become somewhat difficult, still has good grasping but sometimes lifting weights can cause some discomfort.  Sometimes has an aching pain at night.  Patient also has some low back pain.  Have seen her previously.  Does have some degenerative disc disease.  Has responded well to conservative therapy.  Has been a little more active and is going back to the gym.  Wanting to know if this is okay.      Past Medical History:  Diagnosis Date  . Anxiety   . Arthritis   . Cervical dysplasia 1990   CIN 2 subsequent cryosurgery. All Paps normal afterwards  . Dyslipidemia   . Herpes genitalis   . Herpes labialis   . Hypercholesteremia   . IBS (irritable bowel syndrome)   . Osteopenia 04/2014   T score -1.7 FRAX 7.3%/0.8% stable from prior DEXA 2013  . Raynaud disease    Past Surgical History:  Procedure Laterality Date  . AUGMENTATION MAMMAPLASTY    . BREAST ENHANCEMENT SURGERY  2006  . COSMETIC SURGERY    . EYE SURGERY     lasik od  . GYNECOLOGIC CRYOSURGERY  1990  . LAPAROTOMY N/A 04/22/2018   Procedure: EXPLORATORY LAPAROTOMY;  Surgeon: Erroll Luna, MD;  Location: [place] [facility];  Service: General;   Laterality: N/A;  . LIGAMENT REPAIR Right 09/24/2015   Procedure: RIGHT INDEX METACARPAL PHALANGEAL RADIAL COLLATERAL LIGAMENT REPAIR;  Surgeon: Leanora Cover, MD;  Location: [place];  Service: Orthopedics;  Laterality: Right;  . MASS EXCISION N/A 08/04/2018   Procedure: REMOVAL OF SUTURE FROM ABDOMEN/ERAS PATHWAY;  Surgeon: Erroll Luna, MD;  Location: [place];  Service: General;  Laterality: N/A;  . OOPHORECTOMY     BSO  . Right knee arthoscopy Right 09/09/2017   [facility]  . SKIN BIOPSY Right 02/28/2019   Superficial basal carcinoma   . TONSILLECTOMY AND ADENOIDECTOMY    . TOTAL KNEE ARTHROPLASTY Right 04/12/2018  . TOTAL KNEE ARTHROPLASTY Right 04/12/2018   Procedure: RIGHT TOTAL KNEE ARTHROPLASTY;  Surgeon: Melrose Nakayama, MD;  Location: [place];  Service: Orthopedics;  Laterality: Right;  . TUBAL LIGATION    . UPPER GASTROINTESTINAL ENDOSCOPY  04/24/05  . VAGINAL HYSTERECTOMY  2005   TVH BSO  adenomyosis   Social History   Socioeconomic History  . Marital status: Married    Spouse name: Not on file  . Number of children: Not on file  . Years of education: Not on file  . Highest education level: Not on file  Occupational History  . Not on file  Tobacco Use  . Smoking status: Former Smoker    Quit date: 03/21/2006    Years since quitting: 13.8  .  Smokeless tobacco: Never Used  Substance and Sexual Activity  . Alcohol use: [name]    Alcohol/week: 0.0 standard drinks    Comment: drinking in 1993  . Drug use: [name]  . Sexual activity: Yes    Birth control/protection: Surgical    Comment: HYST-1st intercourse 64 yo-More than 5 partners  Other Topics Concern  . Not on file  Social History Narrative  . Not on file   Social Determinants of Health   Financial Resource Strain:   . Difficulty of Paying Living Expenses:   Food Insecurity:   . Worried About Charity fundraiser in the Last Year:   . Arboriculturist in the Last Year:     Transportation Needs:   . Film/video editor (Medical):   Marland Kitchen Lack of Transportation (Non-Medical):   Physical Activity:   . Days of Exercise per Week:   . Minutes of Exercise per Session:   Stress:   . Feeling of Stress :   Social Connections:   . Frequency of Communication with Friends and Family:   . Frequency of Social Gatherings with Friends and Family:   . Attends Religious Services:   . Active Member of Clubs or Organizations:   . Attends Archivist Meetings:   Marland Kitchen Marital Status:    Allergies  Allergen Reactions  . Morphine And Related Nausea Only   Family History  Problem Relation Age of Onset  . Hypertension Mother   . Cancer Father 26       Lymphoma  . Breast cancer Sister 72  . Colon cancer Neg Hx      Current Outpatient Medications (Cardiovascular):  .  simvastatin (ZOCOR) 20 MG tablet, Take 1 tablet (20 mg total) by mouth daily.  Current Outpatient Medications (Respiratory):  .  montelukast (SINGULAIR) 10 MG tablet, Take 1 tablet (10 mg total) by mouth at bedtime.  Current Outpatient Medications (Analgesics):  .  ibuprofen (ADVIL,MOTRIN) 800 MG tablet, Take 1 tablet (800 mg total) by mouth every 8 (eight) hours as needed. .  meloxicam (MOBIC) 15 MG tablet, Take 1 tablet (15 mg total) by mouth daily. Marland Kitchen  oxyCODONE (OXY IR/ROXICODONE) 5 MG immediate release tablet, Take 1 tablet (5 mg total) by mouth every 6 (six) hours as needed for severe pain. .  traMADol (ULTRAM) 50 MG tablet, Take 50 mg by mouth every 6 (six) hours as needed.   Current Outpatient Medications (Other):  .  b complex vitamins tablet, Take 1 tablet by mouth daily. .  bisacodyl (DULCOLAX) 5 MG EC tablet, Take 1 tablet (5 mg total) by mouth daily as needed for moderate constipation. .  cholecalciferol (VITAMIN D) 1000 UNITS tablet, Take 1,000 Units by mouth daily. Marland Kitchen  docusate sodium (COLACE) 100 MG capsule, Take 1 capsule (100 mg total) by mouth 2 (two) times daily. Marland Kitchen  gabapentin  (NEURONTIN) 600 MG tablet, Take 1,200 mg by mouth 2 (two) times daily.  Marland Kitchen  lamoTRIgine (LAMICTAL) 100 MG tablet, Take 100 mg by mouth 2 (two) times daily.  Marland Kitchen  LOTEMAX SM 0.38 % GEL, Apply 1 drop to eye 4 (four) times daily. .  Nutritional Supplements (VITAMIN D BOOSTER PO), Take 2,000 Units by mouth. Marland Kitchen  PROCTOZONE-HC 2.5 % rectal cream, apply rectally twice a day (Patient taking differently: Apply rectally twice a day as needed for irritations) .  sulfamethoxazole-trimethoprim (BACTRIM DS) 800-160 MG tablet, Take 1 tablet by mouth 2 (two) times daily. Marland Kitchen  terbinafine (LAMISIL) 250 MG tablet,  Take 1 tablet (250 mg total) by mouth daily. .  traZODone (DESYREL) 50 MG tablet, Take 50 mg by mouth at bedtime. .  valACYclovir (VALTREX) 1000 MG tablet, Take 1 tablet (1,000 mg total) by mouth 2 (two) times daily. .  vitamin C (ASCORBIC ACID) 500 MG tablet, Take 500 mg by mouth daily. .  Vitamin D, Ergocalciferol, (DRISDOL) 1.25 MG (50000 UT) CAPS capsule, Take 1 capsule (50,000 Units total) by mouth every 7 (seven) days.   Reviewed prior external information including notes and imaging from  primary care provider As well as notes that were available from care everywhere and other healthcare systems.  Past medical history, social, surgical and family history all reviewed in electronic medical record.  [name]   Review of Systems:  [name], visual changes, nausea, vomiting, diarrhea, constipation, dizziness, abdominal pain, skin rash, fevers, chills, night sweats, weight loss, swollen lymph nodes, body aches, joint swelling, chest pain, shortness of breath, mood changes. POSITIVE muscle aches  Objective  Blood pressure 118/80, height 5\' 4"  (1.626 m), weight 117 lb (53.1 kg).   General: [name], dressed appropriately.  HEENT: Pupils equal, extraocular movements intact    Respiratory: Patient's speak in full sentences and does not appear short of breath  Cardiovascular: [name], non tender, [name]  Skin: Warm dry intact with [name] signs of infection or rash on extremities or on axial skeleton.  Abdomen: Soft nontender  Neuro: Cranial nerves II through XII are intact, neurovascularly intact in all extremities with 2+ DTRs and 2+ pulses.  Lymph: [name] lymphadenopathy of posterior or anterior cervical chain or axillae bilaterally.  Gait antalgic favoring patient's right knee MSK: Left hand exam shows the patient does have [place] arthritis noted.  Positive grind test noted.  Patient does have very mild thenar eminence wasting on the left side.  Low back exam does have some very mild scoliosis and does have some loss of lordosis.  Tightness with Corky Sox right greater than left.  Negative straight leg test.  Neurovascularly intact distally.  The musculoskeletal ultrasound was performed and interpreted by Lyndal Pulley  Limited ultrasound of patient's [facility] joint shows the patient does have moderate narrowing and does have a very small effusion noted.  Procedure: Real-time Ultrasound Guided Injection of left CMC joint Device: GE Logiq Q7 Ultrasound guided injection is preferred based studies that show increased duration, increased effect, greater accuracy, decreased procedural pain, increased response rate, and decreased cost with ultrasound guided versus blind injection.  Verbal informed consent obtained.  Time-out conducted.  Noted [name] overlying erythema, induration, or other signs of local infection.  Skin prepped in a sterile fashion.  Local anesthesia: Topical Ethyl chloride.  With sterile technique and under real time ultrasound guidance: With a 25-gauge needle patient was injected with 0.5 cc of 0.5% Marcaine and 0.5 cc of Kenalog 40 mg in left CMC joint Completed without difficulty  Pain immediately resolved suggesting accurate placement of the  medication.  Advised to call if fevers/chills, erythema, induration, drainage, or persistent bleeding.  Images permanently stored and available for review in the ultrasound unit.  Impression: Technically successful ultrasound guided injection.  Osteopathic findings  T9 extended rotated and side bent left L2 flexed rotated and side bent right Sacrum right on right     Impression and Recommendations:     This case required medical decision making of moderate  complexity. The above documentation has been reviewed and is accurate and complete Lyndal Pulley, DO       Note: This dictation was prepared with Dragon dictation along with smaller phrase technology. Any transcriptional errors that result from this process are unintentional.

## 2020-01-18 NOTE — Patient Instructions (Signed)
Good to see you Arnica lotion See me again in 4 weeks

## 2020-02-15 ENCOUNTER — Ambulatory Visit: Payer: Federal, State, Local not specified - PPO | Admitting: Family Medicine

## 2020-02-15 DIAGNOSIS — L812 Freckles: Secondary | ICD-10-CM | POA: Diagnosis not present

## 2020-02-15 DIAGNOSIS — L821 Other seborrheic keratosis: Secondary | ICD-10-CM | POA: Diagnosis not present

## 2020-02-15 DIAGNOSIS — Z85828 Personal history of other malignant neoplasm of skin: Secondary | ICD-10-CM | POA: Diagnosis not present

## 2020-02-15 DIAGNOSIS — L570 Actinic keratosis: Secondary | ICD-10-CM | POA: Diagnosis not present

## 2020-02-15 DIAGNOSIS — C44619 Basal cell carcinoma of skin of left upper limb, including shoulder: Secondary | ICD-10-CM | POA: Diagnosis not present

## 2020-02-19 HISTORY — PX: OTHER SURGICAL HISTORY: SHX169

## 2020-02-27 ENCOUNTER — Ambulatory Visit: Payer: Federal, State, Local not specified - PPO | Admitting: Family Medicine

## 2020-02-27 ENCOUNTER — Encounter: Payer: Self-pay | Admitting: Family Medicine

## 2020-02-27 ENCOUNTER — Other Ambulatory Visit: Payer: Self-pay

## 2020-02-27 VITALS — BP 110/70 | HR 67 | Temp 96.2°F | Wt 116.0 lb

## 2020-02-27 DIAGNOSIS — R591 Generalized enlarged lymph nodes: Secondary | ICD-10-CM | POA: Diagnosis not present

## 2020-02-27 DIAGNOSIS — H00014 Hordeolum externum left upper eyelid: Secondary | ICD-10-CM | POA: Diagnosis not present

## 2020-02-27 MED ORDER — ERYTHROMYCIN 5 MG/GM OP OINT: 1.0000 "application " | TOPICAL_OINTMENT | Freq: Three times a day (TID) | OPHTHALMIC | 0 refills | Status: DC

## 2020-02-27 NOTE — Progress Notes (Signed)
   Subjective:    Patient ID: Bethany [name], female    DOB: Jan 23, 1956, 63 y.o.   MRN: [id number]  HPI She is here for evaluation of swelling in the left preauricular area.  She recently had a dermatologic procedure in the area where she notes the swelling.  She also notes some swelling and erythema to her left upper eyelid.   Review of Systems     Objective:   Physical Exam  Aler and in no distress.recent healing scar noted in the left preauricular area with a lymph node of approximately a centimeter in size near that.  The left upper eyelid is red and swollen with a slight stye in the medial aspect of it.      Assessment & Plan:  Hordeolum externum of left upper eyelid - Plan: erythromycin ophthalmic ointment  Lymphadenopathy Recommend heat in the antibiotic ointment 3 times per day.  Reassured her that this is nothing of any major concern. I then discussed the lymphadenopathy is probably secondary to the recent surgical procedure on her face.  She was comfortable with that.

## 2020-02-28 ENCOUNTER — Ambulatory Visit: Payer: Federal, State, Local not specified - PPO | Admitting: Family Medicine

## 2020-02-28 NOTE — Progress Notes (Deleted)
Beth[place] [name] Phone: [phone] Subjective:    I'm seeing this patient by the request  of:  Bethany Lung, MD  CC:   [id number]  Bethany [name] is a 64 y.o. female coming in with complaint of ***  Onset-  Location Duration-  Character- Aggravating factors- Reliving factors-  Therapies tried-  Severity-     Past Medical History:  Diagnosis Date  . Anxiety   . Arthritis   . Cervical dysplasia 1990   CIN 2 subsequent cryosurgery. All Paps normal afterwards  . Dyslipidemia   . Herpes genitalis   . Herpes labialis   . Hypercholesteremia   . IBS (irritable bowel syndrome)   . Osteopenia 04/2014   T score -1.7 FRAX 7.3%/0.8% stable from prior DEXA 2013  . Raynaud disease    Past Surgical History:  Procedure Laterality Date  . AUGMENTATION MAMMAPLASTY    . BREAST ENHANCEMENT SURGERY  2006  . COSMETIC SURGERY    . EYE SURGERY     lasik od  . GYNECOLOGIC CRYOSURGERY  1990  . LAPAROTOMY N/A 04/22/2018   Procedure: EXPLORATORY LAPAROTOMY;  Surgeon: Erroll Luna, MD;  Location: [place];  Service: General;  Laterality: N/A;  . LIGAMENT REPAIR Right 09/24/2015   Procedure: RIGHT INDEX METACARPAL PHALANGEAL RADIAL COLLATERAL LIGAMENT REPAIR;  Surgeon: Leanora Cover, MD;  Location: [place];  Service: Orthopedics;  Laterality: Right;  . MASS EXCISION N/A 08/04/2018   Procedure: REMOVAL OF SUTURE FROM ABDOMEN/ERAS PATHWAY;  Surgeon: Erroll Luna, MD;  Location: [place];  Service: General;  Laterality: N/A;  . OOPHORECTOMY     BSO  . Right knee arthoscopy Right 09/09/2017   [facility]  . SHX 1 Left 02/19/2020   basel cell carcinoma  . SKIN BIOPSY Right 02/28/2019   Superficial basal carcinoma   . TONSILLECTOMY AND ADENOIDECTOMY    . TOTAL KNEE ARTHROPLASTY Right 04/12/2018  . TOTAL KNEE ARTHROPLASTY Right 04/12/2018   Procedure: RIGHT TOTAL KNEE ARTHROPLASTY;   Surgeon: Melrose Nakayama, MD;  Location: [place];  Service: Orthopedics;  Laterality: Right;  . TUBAL LIGATION    . UPPER GASTROINTESTINAL ENDOSCOPY  04/24/05  . VAGINAL HYSTERECTOMY  2005   TVH BSO  adenomyosis   Social History   Socioeconomic History  . Marital status: Married    Spouse name: Not on file  . Number of children: Not on file  . Years of education: Not on file  . Highest education level: Not on file  Occupational History  . Not on file  Tobacco Use  . Smoking status: Former Smoker    Quit date: 03/21/2006    Years since quitting: 13.9  . Smokeless tobacco: Never Used  Substance and Sexual Activity  . Alcohol use: No    Alcohol/week: 0.0 standard drinks    Comment: drinking in 1993  . Drug use: No  . Sexual activity: Yes    Birth control/protection: Surgical    Comment: HYST-1st intercourse 64 yo-More than 5 partners  Other Topics Concern  . Not on file  Social History Narrative  . Not on file   Social Determinants of Health   Financial Resource Strain:   . Difficulty of Paying Living Expenses:   Food Insecurity:   . Worried About Charity fundraiser in the Last Year:   . [place] in the Last Year:   Transportation Needs:   . Lack of  Transportation (Medical):   Marland Kitchen Lack of Transportation (Non-Medical):   Physical Activity:   . Days of Exercise per Week:   . Minutes of Exercise per Session:   Stress:   . Feeling of Stress :   Social Connections:   . Frequency of Communication with Friends and Family:   . Frequency of Social Gatherings with Friends and Family:   . Attends Religious Services:   . Active Member of Clubs or Organizations:   . Attends Archivist Meetings:   Marland Kitchen Marital Status:    Allergies  Allergen Reactions  . Morphine And Related Nausea Only   Family History  Problem Relation Age of Onset  . Hypertension Mother   . Cancer Father 23       Lymphoma  . Breast cancer Sister 25  . Colon cancer Neg Hx      Current  Outpatient Medications (Cardiovascular):  .  simvastatin (ZOCOR) 20 MG tablet, Take 1 tablet (20 mg total) by mouth daily.  Current Outpatient Medications (Respiratory):  .  montelukast (SINGULAIR) 10 MG tablet, Take 1 tablet (10 mg total) by mouth at bedtime. (Patient not taking: Reported on 02/27/2020)  Current Outpatient Medications (Analgesics):  .  ibuprofen (ADVIL,MOTRIN) 800 MG tablet, Take 1 tablet (800 mg total) by mouth every 8 (eight) hours as needed. .  meloxicam (MOBIC) 15 MG tablet, Take 1 tablet (15 mg total) by mouth daily. (Patient not taking: Reported on 02/27/2020) .  oxyCODONE (OXY IR/ROXICODONE) 5 MG immediate release tablet, Take 1 tablet (5 mg total) by mouth every 6 (six) hours as needed for severe pain. (Patient not taking: Reported on 02/27/2020) .  traMADol (ULTRAM) 50 MG tablet, Take 50 mg by mouth every 6 (six) hours as needed.   Current Outpatient Medications (Other):  .  b complex vitamins tablet, Take 1 tablet by mouth daily. .  bisacodyl (DULCOLAX) 5 MG EC tablet, Take 1 tablet (5 mg total) by mouth daily as needed for moderate constipation. .  cholecalciferol (VITAMIN D) 1000 UNITS tablet, Take 1,000 Units by mouth daily. .  cycloSPORINE (RESTASIS) 0.05 % ophthalmic emulsion, INSTILL 1 DROP IN BOTH EYES TWICE DAILY .  docusate sodium (COLACE) 100 MG capsule, Take 1 capsule (100 mg total) by mouth 2 (two) times daily. Marland Kitchen  erythromycin ophthalmic ointment, Place 1 application into the left eye 3 (three) times daily. Marland Kitchen  gabapentin (NEURONTIN) 600 MG tablet, Take 1,200 mg by mouth 2 (two) times daily.  Marland Kitchen  lamoTRIgine (LAMICTAL) 100 MG tablet, Take 100 mg by mouth 2 (two) times daily.  Marland Kitchen  LOTEMAX SM 0.38 % GEL, Apply 1 drop to eye 4 (four) times daily. .  Nutritional Supplements (VITAMIN D BOOSTER PO), Take 2,000 Units by mouth. Marland Kitchen  PROCTOZONE-HC 2.5 % rectal cream, apply rectally twice a day (Patient taking differently: Apply rectally twice a day as needed for  irritations) .  terbinafine (LAMISIL) 250 MG tablet, Take 1 tablet (250 mg total) by mouth daily. (Patient not taking: Reported on 02/27/2020) .  traZODone (DESYREL) 50 MG tablet, Take 50 mg by mouth at bedtime. .  valACYclovir (VALTREX) 1000 MG tablet, Take 1 tablet (1,000 mg total) by mouth 2 (two) times daily. .  vitamin C (ASCORBIC ACID) 500 MG tablet, Take 500 mg by mouth daily.   Reviewed prior external information including notes and imaging from  primary care provider As well as notes that were available from care everywhere and other healthcare systems.  Past medical history, social,  surgical and family history all reviewed in electronic medical record.  No pertanent information unless stated regarding to the chief complaint.   Review of Systems:  No headache, visual changes, nausea, vomiting, diarrhea, constipation, dizziness, abdominal pain, skin rash, fevers, chills, night sweats, weight loss, swollen lymph nodes, body aches, joint swelling, chest pain, shortness of breath, mood changes. POSITIVE muscle aches  Objective  There were no vitals taken for this visit.   General: No apparent distress alert and oriented x3 mood and affect normal, dressed appropriately.  HEENT: Pupils equal, extraocular movements intact  Respiratory: Patient's speak in full sentences and does not appear short of breath  Cardiovascular: No lower extremity edema, non tender, no erythema  Neuro: Cranial nerves II through XII are intact, neurovascularly intact in all extremities with 2+ DTRs and 2+ pulses.  Gait normal with good balance and coordination.  MSK:  Non tender with full range of motion and good stability and symmetric strength and tone of shoulders, elbows, wrist, hip, knee and ankles bilaterally.     Impression and Recommendations:     This case required medical decision making of moderate complexity. The above documentation has been reviewed and is accurate and complete Lyndal Pulley,  DO       Note: This dictation was prepared with Dragon dictation along with smaller phrase technology. Any transcriptional errors that result from this process are unintentional.

## 2020-03-01 ENCOUNTER — Encounter: Payer: Self-pay | Admitting: Family Medicine

## 2020-03-05 DIAGNOSIS — L570 Actinic keratosis: Secondary | ICD-10-CM | POA: Diagnosis not present

## 2020-03-05 DIAGNOSIS — L738 Other specified follicular disorders: Secondary | ICD-10-CM | POA: Diagnosis not present

## 2020-03-15 ENCOUNTER — Ambulatory Visit: Payer: Federal, State, Local not specified - PPO | Admitting: Family Medicine

## 2020-03-15 ENCOUNTER — Other Ambulatory Visit: Payer: Self-pay

## 2020-03-15 ENCOUNTER — Encounter: Payer: Self-pay | Admitting: Family Medicine

## 2020-03-15 VITALS — BP 108/68 | HR 76 | Temp 97.3°F | Wt 115.8 lb

## 2020-03-15 DIAGNOSIS — N3001 Acute cystitis with hematuria: Secondary | ICD-10-CM | POA: Diagnosis not present

## 2020-03-15 LAB — POCT URINALYSIS DIP (PROADVANTAGE DEVICE)
Bilirubin, UA: NEGATIVE
Glucose, UA: NEGATIVE mg/dL
Ketones, POC UA: NEGATIVE mg/dL
Leukocytes, UA: NEGATIVE
Nitrite, UA: NEGATIVE
Protein Ur, POC: NEGATIVE mg/dL
Specific Gravity, Urine: 1.02
Urobilinogen, Ur: 0.2
pH, UA: 6 (ref 5.0–8.0)

## 2020-03-15 MED ORDER — SULFAMETHOXAZOLE-TRIMETHOPRIM 800-160 MG PO TABS: 1.0000 | ORAL_TABLET | Freq: Two times a day (BID) | ORAL | 0 refills | Status: DC

## 2020-03-15 NOTE — Progress Notes (Signed)
   Subjective:    Patient ID: Bethany [name], female    DOB: 1956-09-20, 64 y.o.   MRN: [id number]  HPI She complains of a several day history of urinary urgency, dysuria, frequency.  She was finishing up on doxycycline and the symptoms started.  That was being used for treatment of staph infection on her face.   Review of Systems     Objective:   Physical Exam Alert and in no distress.  Urine microscopic did show RBCs.      Assessment & Plan:  Acute cystitis with hematuria - Plan: sulfamethoxazole-trimethoprim (BACTRIM DS) 800-160 MG tablet, POCT Urinalysis DIP (Proadvantage Device) I will treated with a 3-day course.  She is to return here in 10 days for repeat urinalysis since she did have blood in there.  She will also call for 3 days does not adequate clear up her problem.

## 2020-03-26 ENCOUNTER — Ambulatory Visit (INDEPENDENT_AMBULATORY_CARE_PROVIDER_SITE_OTHER): Payer: Federal, State, Local not specified - PPO

## 2020-03-26 ENCOUNTER — Encounter: Payer: Self-pay | Admitting: Family Medicine

## 2020-03-26 ENCOUNTER — Ambulatory Visit: Payer: Federal, State, Local not specified - PPO | Admitting: Family Medicine

## 2020-03-26 ENCOUNTER — Other Ambulatory Visit: Payer: Self-pay

## 2020-03-26 VITALS — BP 132/82 | Ht 64.0 in | Wt 117.0 lb

## 2020-03-26 DIAGNOSIS — Z8739 Personal history of other diseases of the musculoskeletal system and connective tissue: Secondary | ICD-10-CM | POA: Insufficient documentation

## 2020-03-26 DIAGNOSIS — M999 Biomechanical lesion, unspecified: Secondary | ICD-10-CM

## 2020-03-26 DIAGNOSIS — M1812 Unilateral primary osteoarthritis of first carpometacarpal joint, left hand: Secondary | ICD-10-CM

## 2020-03-26 HISTORY — DX: Personal history of other diseases of the musculoskeletal system and connective tissue: Z87.39

## 2020-03-26 NOTE — Progress Notes (Signed)
Bethany [name] Phone: [phone] Subjective:   Bethany [name], am serving as a scribe for Dr. Hulan Saas. This visit occurred during the SARS-CoV-2 public health emergency.  Safety protocols were in place, including screening questions prior to the visit, additional usage of staff PPE, and extensive cleaning of exam room while observing appropriate contact time as indicated for disinfecting solutions.   I'm seeing this patient by the request  of:  Denita Lung, MD  CC: Left thumb pain follow-up, back pain follow-up  [id number]   01/18/2020 New problem, given injection today, moderate arthritic changes.  Patient of course wants to avoid any type of surgical intervention.  Discussed home exercise, icing regimen, which activities to which wants to avoid.  Patient will increase activity as tolerated.  Follow-up again in 4 to 8 weeks  Update 03/26/2020 Bethany [name] is a 64 y.o. female coming in with complaint of left thumb pain and back pain. Patient states that injection did help alleviate some of her pain. Still has pain with gripping objects. [name] issues with her back since last visit.  Patient has some mild tightness in the lower back but nothing severe.  Continues to have pain of the thumb and can stop her from certain activities.  Patient is wondering if anything else can be done.  Wants to avoid any type of surgical intervention.      Past Medical History:  Diagnosis Date  . Anxiety   . Arthritis   . Cervical dysplasia 1990   CIN 2 subsequent cryosurgery. All Paps normal afterwards  . Dyslipidemia   . Herpes genitalis   . Herpes labialis   . Hypercholesteremia   . IBS (irritable bowel syndrome)   . Osteopenia 04/2014   T score -1.7 FRAX 7.3%/0.8% stable from prior DEXA 2013  . Raynaud disease    Past Surgical History:  Procedure Laterality Date  . AUGMENTATION MAMMAPLASTY    . BREAST ENHANCEMENT  SURGERY  2006  . COSMETIC SURGERY    . EYE SURGERY     lasik od  . GYNECOLOGIC CRYOSURGERY  1990  . LAPAROTOMY N/A 04/22/2018   Procedure: EXPLORATORY LAPAROTOMY;  Surgeon: Erroll Luna, MD;  Location: [facility];  Service: General;  Laterality: N/A;  . LIGAMENT REPAIR Right 09/24/2015   Procedure: RIGHT INDEX METACARPAL PHALANGEAL RADIAL COLLATERAL LIGAMENT REPAIR;  Surgeon: Leanora Cover, MD;  Location: [place] [facility];  Service: Orthopedics;  Laterality: Right;  . MASS EXCISION N/A 08/04/2018   Procedure: REMOVAL OF SUTURE FROM ABDOMEN/ERAS PATHWAY;  Surgeon: Erroll Luna, MD;  Location: [place];  Service: General;  Laterality: N/A;  . OOPHORECTOMY     BSO  . Right knee arthoscopy Right 09/09/2017   [facility]  . SHX 1 Left 02/19/2020   basel cell carcinoma  . SKIN BIOPSY Right 02/28/2019   Superficial basal carcinoma   . TONSILLECTOMY AND ADENOIDECTOMY    . TOTAL KNEE ARTHROPLASTY Right 04/12/2018  . TOTAL KNEE ARTHROPLASTY Right 04/12/2018   Procedure: RIGHT TOTAL KNEE ARTHROPLASTY;  Surgeon: Melrose Nakayama, MD;  Location: [facility];  Service: Orthopedics;  Laterality: Right;  . TUBAL LIGATION    . UPPER GASTROINTESTINAL ENDOSCOPY  04/24/05  . VAGINAL HYSTERECTOMY  2005   TVH BSO  adenomyosis   Social History   Socioeconomic History  . Marital status: Married    Spouse name: Not on file  . Number of children: Not on file  .  Years of education: Not on file  . Highest education level: Not on file  Occupational History  . Not on file  Tobacco Use  . Smoking status: Former Smoker    Quit date: 03/21/2006    Years since quitting: 14.0  . Smokeless tobacco: Never Used  Substance and Sexual Activity  . Alcohol use: [name]    Alcohol/week: 0.0 standard drinks    Comment: drinking in 1993  . Drug use: [name]  . Sexual activity: Yes    Birth control/protection: Surgical    Comment: HYST-1st intercourse 64 yo-More than 5 partners  Other Topics Concern  .  Not on file  Social History Narrative  . Not on file   Social Determinants of Health   Financial Resource Strain:   . Difficulty of Paying Living Expenses:   Food Insecurity:   . Worried About Charity fundraiser in the Last Year:   . Arboriculturist in the Last Year:   Transportation Needs:   . Film/video editor (Medical):   Marland Kitchen Lack of Transportation (Non-Medical):   Physical Activity:   . Days of Exercise per Week:   . Minutes of Exercise per Session:   Stress:   . Feeling of Stress :   Social Connections:   . Frequency of Communication with Friends and Family:   . Frequency of Social Gatherings with Friends and Family:   . Attends Religious Services:   . Active Member of Clubs or Organizations:   . Attends Archivist Meetings:   Marland Kitchen Marital Status:    Allergies  Allergen Reactions  . Morphine And Related Nausea Only   Family History  Problem Relation Age of Onset  . Hypertension Mother   . Cancer Father 45       Lymphoma  . Breast cancer Sister 62  . Colon cancer Neg Hx      Current Outpatient Medications (Cardiovascular):  .  simvastatin (ZOCOR) 20 MG tablet, Take 1 tablet (20 mg total) by mouth daily.  Current Outpatient Medications (Respiratory):  .  montelukast (SINGULAIR) 10 MG tablet, Take 1 tablet (10 mg total) by mouth at bedtime.  Current Outpatient Medications (Analgesics):  .  ibuprofen (ADVIL,MOTRIN) 800 MG tablet, Take 1 tablet (800 mg total) by mouth every 8 (eight) hours as needed. .  meloxicam (MOBIC) 15 MG tablet, Take 1 tablet (15 mg total) by mouth daily. Marland Kitchen  oxyCODONE (OXY IR/ROXICODONE) 5 MG immediate release tablet, Take 1 tablet (5 mg total) by mouth every 6 (six) hours as needed for severe pain. .  traMADol (ULTRAM) 50 MG tablet, Take 50 mg by mouth every 6 (six) hours as needed.   Current Outpatient Medications (Other):  .  b complex vitamins tablet, Take 1 tablet by mouth daily. .  bisacodyl (DULCOLAX) 5 MG EC tablet,  Take 1 tablet (5 mg total) by mouth daily as needed for moderate constipation. .  cholecalciferol (VITAMIN D) 1000 UNITS tablet, Take 1,000 Units by mouth daily. .  cycloSPORINE (RESTASIS) 0.05 % ophthalmic emulsion, INSTILL 1 DROP IN BOTH EYES TWICE DAILY .  docusate sodium (COLACE) 100 MG capsule, Take 1 capsule (100 mg total) by mouth 2 (two) times daily. Marland Kitchen  doxycycline (VIBRAMYCIN) 100 MG capsule, Take 100 mg by mouth 2 (two) times daily. Marland Kitchen  erythromycin ophthalmic ointment, Place 1 application into the left eye 3 (three) times daily. Marland Kitchen  gabapentin (NEURONTIN) 600 MG tablet, Take 1,200 mg by mouth 2 (two) times daily.  Marland Kitchen  lamoTRIgine (LAMICTAL) 100 MG tablet, Take 100 mg by mouth 2 (two) times daily.  Marland Kitchen  LOTEMAX SM 0.38 % GEL, Apply 1 drop to eye 4 (four) times daily. .  Nutritional Supplements (VITAMIN D BOOSTER PO), Take 2,000 Units by mouth. Marland Kitchen  PROCTOZONE-HC 2.5 % rectal cream, apply rectally twice a day (Patient taking differently: Apply rectally twice a day as needed for irritations) .  sulfamethoxazole-trimethoprim (BACTRIM DS) 800-160 MG tablet, Take 1 tablet by mouth 2 (two) times daily. Marland Kitchen  terbinafine (LAMISIL) 250 MG tablet, Take 1 tablet (250 mg total) by mouth daily. .  traZODone (DESYREL) 50 MG tablet, Take 50 mg by mouth at bedtime. .  valACYclovir (VALTREX) 1000 MG tablet, Take 1 tablet (1,000 mg total) by mouth 2 (two) times daily. .  vitamin C (ASCORBIC ACID) 500 MG tablet, Take 500 mg by mouth daily.   Reviewed prior external information including notes and imaging from  primary care provider As well as notes that were available from care everywhere and other healthcare systems.  Past medical history, social, surgical and family history all reviewed in electronic medical record.  [name]   Review of Systems:  [name], visual changes, nausea, vomiting, diarrhea, constipation, dizziness, abdominal pain,  skin rash, fevers, chills, night sweats, weight loss, swollen lymph nodes,, joint swelling, chest pain, shortness of breath, mood changes. POSITIVE muscle aches, body aches Objective  Blood pressure 132/82, height 5\' 4"  (1.626 m), weight 117 lb (53.1 kg).   General: [name], dressed appropriately.  HEENT: Pupils equal, extraocular movements intact  Respiratory: Patient's speak in full sentences and does not appear short of breath  Cardiovascular: [name], non tender, [name]  Neuro: Cranial nerves II through XII are intact, neurovascularly intact in all extremities with 2+ DTRs and 2+ pulses.  Gait antalgic favoring patient's right knee but has had a replacement and does have some tightness MSK: Left thumb does have some tenderness to palpation with positive grind test noted.  [name] thenar eminence wasting. Back exam mild loss of lordosis.  Some tightness noted in the parascapular region right greater than left.  Mild pain in the sacroiliac joint right greater than left as well.  Mild tightness with Corky Sox.  Negative straight leg test.  Limited musculoskeletal ultrasound was performed and interpreted by Lyndal Pulley  Limited ultrasound of patient's thumb on the left side shows the [facility] joint does not have an effusion like last time but and possible CPPD noted.  Moderate arthritic changes still noted. Impression: Moderate arthritis of the thumb joint as well as potential CPPD  Osteopathic findings  C4 flexed rotated and side bent left T8 extended rotated and side bent right i L2 flexed rotated and side bent right Sacrum right on right     Impression and Recommendations:     This case required medical decision making of moderate complexity. The above documentation has been reviewed and is accurate and complete Lyndal Pulley, DO       Note: This dictation was prepared with Dragon dictation along with  smaller phrase technology. Any transcriptional errors that result from this process are unintentional.

## 2020-03-26 NOTE — Assessment & Plan Note (Signed)
Patient does have moderate arthritic changes.  Patient though also has what appears to be more of a CPPD.  Discussed which activities to do which wants to avoid.  Patient will increase activity slowly.  Follow-up again in 4 to 8 weeks worsening pain consider injection again.

## 2020-03-26 NOTE — Assessment & Plan Note (Signed)

## 2020-03-26 NOTE — Patient Instructions (Signed)
Pseudogout Low oxilate diet Tart cherry 1500mg  at night Hold on injection See me in 5 weeks

## 2020-03-28 DIAGNOSIS — H18593 Other hereditary corneal dystrophies, bilateral: Secondary | ICD-10-CM | POA: Diagnosis not present

## 2020-04-02 ENCOUNTER — Ambulatory Visit: Payer: Federal, State, Local not specified - PPO | Admitting: Family Medicine

## 2020-04-03 ENCOUNTER — Other Ambulatory Visit: Payer: Self-pay

## 2020-04-03 ENCOUNTER — Ambulatory Visit: Payer: Federal, State, Local not specified - PPO | Admitting: Family Medicine

## 2020-04-03 ENCOUNTER — Encounter: Payer: Self-pay | Admitting: Family Medicine

## 2020-04-03 VITALS — BP 126/74 | HR 77 | Temp 98.0°F | Wt 117.2 lb

## 2020-04-03 DIAGNOSIS — N3001 Acute cystitis with hematuria: Secondary | ICD-10-CM

## 2020-04-03 LAB — POCT URINALYSIS DIP (PROADVANTAGE DEVICE)
Blood, UA: NEGATIVE
Glucose, UA: NEGATIVE mg/dL
Ketones, POC UA: NEGATIVE mg/dL
Leukocytes, UA: NEGATIVE
Nitrite, UA: NEGATIVE
Specific Gravity, Urine: 1.025
Urobilinogen, Ur: 0.2
pH, UA: 6 (ref 5.0–8.0)

## 2020-04-03 NOTE — Addendum Note (Signed)
Addended by: Elyse Jarvis on: 04/03/2020 12:13 PM   Modules accepted: Orders

## 2020-04-03 NOTE — Progress Notes (Signed)
   Subjective:    Patient ID: Bethany [name], female    DOB: 1956/06/21, 64 y.o.   MRN: [id number]  HPI She is here for a recheck.  The previous urinalysis did show red cells.  She is now totally symptom-free.   Review of Systems     Objective:   Physical Exam Alert and in no distress.  Urine dipstick was negative.       Assessment & Plan:  Acute cystitis with hematuria No further intervention is necessary.

## 2020-05-02 ENCOUNTER — Ambulatory Visit (INDEPENDENT_AMBULATORY_CARE_PROVIDER_SITE_OTHER): Payer: Federal, State, Local not specified - PPO

## 2020-05-02 ENCOUNTER — Encounter: Payer: Self-pay | Admitting: Family Medicine

## 2020-05-02 ENCOUNTER — Ambulatory Visit: Payer: Self-pay

## 2020-05-02 ENCOUNTER — Other Ambulatory Visit: Payer: Self-pay

## 2020-05-02 ENCOUNTER — Ambulatory Visit: Payer: Federal, State, Local not specified - PPO | Admitting: Family Medicine

## 2020-05-02 VITALS — BP 112/66 | HR 72 | Ht 64.0 in | Wt 116.0 lb

## 2020-05-02 DIAGNOSIS — M1812 Unilateral primary osteoarthritis of first carpometacarpal joint, left hand: Secondary | ICD-10-CM | POA: Diagnosis not present

## 2020-05-02 DIAGNOSIS — M5136 Other intervertebral disc degeneration, lumbar region: Secondary | ICD-10-CM | POA: Diagnosis not present

## 2020-05-02 DIAGNOSIS — M79645 Pain in left finger(s): Secondary | ICD-10-CM

## 2020-05-02 DIAGNOSIS — M5127 Other intervertebral disc displacement, lumbosacral region: Secondary | ICD-10-CM | POA: Diagnosis not present

## 2020-05-02 DIAGNOSIS — M999 Biomechanical lesion, unspecified: Secondary | ICD-10-CM

## 2020-05-02 DIAGNOSIS — M51369 Other intervertebral disc degeneration, lumbar region without mention of lumbar back pain or lower extremity pain: Secondary | ICD-10-CM | POA: Insufficient documentation

## 2020-05-02 DIAGNOSIS — G8929 Other chronic pain: Secondary | ICD-10-CM

## 2020-05-02 DIAGNOSIS — Z8739 Personal history of other diseases of the musculoskeletal system and connective tissue: Secondary | ICD-10-CM

## 2020-05-02 HISTORY — DX: Other intervertebral disc degeneration, lumbar region without mention of lumbar back pain or lower extremity pain: M51.369

## 2020-05-02 HISTORY — DX: Other intervertebral disc degeneration, lumbar region: M51.36

## 2020-05-02 MED ORDER — COLCHICINE 0.6 MG PO TABS: 0.6000 mg | ORAL_TABLET | Freq: Every day | ORAL | 0 refills | Status: DC

## 2020-05-02 NOTE — Patient Instructions (Addendum)
Mitigare 2x a day for 10 days Xray today  Write to me in 2 weeks if not better will order MRI See me in 6-7 weeks

## 2020-05-02 NOTE — Assessment & Plan Note (Signed)

## 2020-05-02 NOTE — Assessment & Plan Note (Signed)
degenerative disc disease of lumbar spine.  Discussed which activities to do which wants to avoid.  Patient has history of multiple different epidurals previously.  I would like to get x-rays today and if continuing to have any radicular symptoms will consider the possibility of MRI.  Patient is in agreement with the plan.

## 2020-05-02 NOTE — Progress Notes (Signed)
[place] Phone: [phone] Subjective:   Bethany [name], am serving as a scribe for Dr. Hulan Saas. This visit occurred during the SARS-CoV-2 public health emergency.  Safety protocols were in place, including screening questions prior to the visit, additional usage of staff PPE, and extensive cleaning of exam room while observing appropriate contact time as indicated for disinfecting solutions.   I'm seeing this patient by the request  of:  Denita Lung, MD  CC: Low back pain follow-up, something follow-up  [id number]  Bethany [name] is a 64 y.o. female coming in with complaint of back and left thumb pain. Last seen on 03/26/2020 for OMT. States that her thumb pain has not improved. Pain with movement.  Known arthritic changes of the back.  Patient has been very active.  Playing pickle ball 3 times a week as well as doing weightlifting 3 times a week.  Patient is also been walking and doing some intermittent running.  Patient denies though any weakness of the lower extremities.  Continues to have difficulty though with stability of the right knee.  Also having an increase in lower back pain as she has started to play pickleball.       Past Medical History:  Diagnosis Date  . Anxiety   . Arthritis   . Cervical dysplasia 1990   CIN 2 subsequent cryosurgery. All Paps normal afterwards  . Dyslipidemia   . Herpes genitalis   . Herpes labialis   . Hypercholesteremia   . IBS (irritable bowel syndrome)   . Osteopenia 04/2014   T score -1.7 FRAX 7.3%/0.8% stable from prior DEXA 2013  . Raynaud disease    Past Surgical History:  Procedure Laterality Date  . AUGMENTATION MAMMAPLASTY    . BREAST ENHANCEMENT SURGERY  2006  . COSMETIC SURGERY    . EYE SURGERY     lasik od  . GYNECOLOGIC CRYOSURGERY  1990  . LAPAROTOMY N/A 04/22/2018   Procedure: EXPLORATORY LAPAROTOMY;  Surgeon: Erroll Luna, MD;   Location: [facility];  Service: General;  Laterality: N/A;  . LIGAMENT REPAIR Right 09/24/2015   Procedure: RIGHT INDEX METACARPAL PHALANGEAL RADIAL COLLATERAL LIGAMENT REPAIR;  Surgeon: Leanora Cover, MD;  Location: [place];  Service: Orthopedics;  Laterality: Right;  . MASS EXCISION N/A 08/04/2018   Procedure: REMOVAL OF SUTURE FROM ABDOMEN/ERAS PATHWAY;  Surgeon: Erroll Luna, MD;  Location: [place];  Service: General;  Laterality: N/A;  . OOPHORECTOMY     BSO  . Right knee arthoscopy Right 09/09/2017   [facility]  . SHX 1 Left 02/19/2020   basel cell carcinoma  . SKIN BIOPSY Right 02/28/2019   Superficial basal carcinoma   . TONSILLECTOMY AND ADENOIDECTOMY    . TOTAL KNEE ARTHROPLASTY Right 04/12/2018  . TOTAL KNEE ARTHROPLASTY Right 04/12/2018   Procedure: RIGHT TOTAL KNEE ARTHROPLASTY;  Surgeon: Melrose Nakayama, MD;  Location: [place];  Service: Orthopedics;  Laterality: Right;  . TUBAL LIGATION    . UPPER GASTROINTESTINAL ENDOSCOPY  04/24/05  . VAGINAL HYSTERECTOMY  2005   TVH BSO  adenomyosis   Social History   Socioeconomic History  . Marital status: Married    Spouse name: Not on file  . Number of children: Not on file  . Years of education: Not on file  . Highest education level: Not on file  Occupational History  . Not on file  Tobacco Use  . Smoking  status: Former Smoker    Quit date: 03/21/2006    Years since quitting: 14.1  . Smokeless tobacco: Never Used  Vaping Use  . Vaping Use: Never used  Substance and Sexual Activity  . Alcohol use: [name]    Alcohol/week: 0.0 standard drinks    Comment: drinking in 1993  . Drug use: [name]  . Sexual activity: Yes    Birth control/protection: Surgical    Comment: HYST-1st intercourse 64 yo-More than 5 partners  Other Topics Concern  . Not on file  Social History Narrative  . Not on file   Social Determinants of Health   Financial Resource Strain:   . Difficulty of Paying Living  Expenses:   Food Insecurity:   . Worried About Charity fundraiser in the Last Year:   . Arboriculturist in the Last Year:   Transportation Needs:   . Film/video editor (Medical):   Marland Kitchen Lack of Transportation (Non-Medical):   Physical Activity:   . Days of Exercise per Week:   . Minutes of Exercise per Session:   Stress:   . Feeling of Stress :   Social Connections:   . Frequency of Communication with Friends and Family:   . Frequency of Social Gatherings with Friends and Family:   . Attends Religious Services:   . Active Member of Clubs or Organizations:   . Attends Archivist Meetings:   Marland Kitchen Marital Status:    Allergies  Allergen Reactions  . Morphine And Related Nausea Only   Family History  Problem Relation Age of Onset  . Hypertension Mother   . Cancer Father 28       Lymphoma  . Breast cancer Sister 43  . Colon cancer Neg Hx      Current Outpatient Medications (Cardiovascular):  .  simvastatin (ZOCOR) 20 MG tablet, Take 1 tablet (20 mg total) by mouth daily.  Current Outpatient Medications (Respiratory):  .  montelukast (SINGULAIR) 10 MG tablet, Take 1 tablet (10 mg total) by mouth at bedtime.  Current Outpatient Medications (Analgesics):  .  ibuprofen (ADVIL,MOTRIN) 800 MG tablet, Take 1 tablet (800 mg total) by mouth every 8 (eight) hours as needed. .  meloxicam (MOBIC) 15 MG tablet, Take 1 tablet (15 mg total) by mouth daily. .  traMADol (ULTRAM) 50 MG tablet, Take 50 mg by mouth every 6 (six) hours as needed. .  colchicine 0.6 MG tablet, Take 1 tablet (0.6 mg total) by mouth daily. Marland Kitchen  oxyCODONE (OXY IR/ROXICODONE) 5 MG immediate release tablet, Take 1 tablet (5 mg total) by mouth every 6 (six) hours as needed for severe pain. (Patient not taking: Reported on 04/03/2020)   Current Outpatient Medications (Other):  .  b complex vitamins tablet, Take 1 tablet by mouth daily. .  bisacodyl (DULCOLAX) 5 MG EC tablet, Take 1 tablet (5 mg total) by mouth  daily as needed for moderate constipation. .  cholecalciferol (VITAMIN D) 1000 UNITS tablet, Take 1,000 Units by mouth daily. .  cycloSPORINE (RESTASIS) 0.05 % ophthalmic emulsion, INSTILL 1 DROP IN BOTH EYES TWICE DAILY .  docusate sodium (COLACE) 100 MG capsule, Take 1 capsule (100 mg total) by mouth 2 (two) times daily. Marland Kitchen  doxycycline (VIBRAMYCIN) 100 MG capsule, Take 100 mg by mouth 2 (two) times daily. Marland Kitchen  erythromycin ophthalmic ointment, Place 1 application into the left eye 3 (three) times daily. Marland Kitchen  gabapentin (NEURONTIN) 600 MG tablet, Take 1,200 mg by mouth 2 (two) times daily.  Marland Kitchen  lamoTRIgine (LAMICTAL) 100 MG tablet, Take 100 mg by mouth 2 (two) times daily.  Marland Kitchen  LOTEMAX SM 0.38 % GEL, Apply 1 drop to eye 4 (four) times daily. .  Nutritional Supplements (VITAMIN D BOOSTER PO), Take 2,000 Units by mouth. Marland Kitchen  PROCTOZONE-HC 2.5 % rectal cream, apply rectally twice a day (Patient taking differently: Apply rectally twice a day as needed for irritations) .  sulfamethoxazole-trimethoprim (BACTRIM DS) 800-160 MG tablet, Take 1 tablet by mouth 2 (two) times daily. Marland Kitchen  terbinafine (LAMISIL) 250 MG tablet, Take 1 tablet (250 mg total) by mouth daily. .  traZODone (DESYREL) 50 MG tablet, Take 50 mg by mouth at bedtime. .  valACYclovir (VALTREX) 1000 MG tablet, Take 1 tablet (1,000 mg total) by mouth 2 (two) times daily. .  vitamin C (ASCORBIC ACID) 500 MG tablet, Take 500 mg by mouth daily.   Reviewed prior external information including notes and imaging from  primary care provider As well as notes that were available from care everywhere and other healthcare systems.  Past medical history, social, surgical and family history all reviewed in electronic medical record.  [name]   Review of Systems:  [name], visual changes, nausea, vomiting, diarrhea, constipation, dizziness, abdominal pain, skin rash, fevers, chills, night  sweats, weight loss, swollen lymph nodes, body aches, joint swelling, chest pain, shortness of breath, mood changes. POSITIVE muscle aches  Objective  Blood pressure 112/66, pulse 72, height 5\' 4"  (1.626 m), weight 116 lb (52.6 kg), SpO2 99 %.   General: [name], dressed appropriately.  HEENT: Pupils equal, extraocular movements intact  Respiratory: Patient's speak in full sentences and does not appear short of breath .  Gait antalgic favoring the right knee MSK: Left CMC joint is improved from previous exam.  Still positive grind test Back exam does have some degenerative scoliosis noted.  Mild loss of lordosis, severe tightness of the hip flexors bilaterally right greater than left.  5-5 strength of the lower extremities.  Osteopathic findings  T9 extended rotated and side bent left L2 flexed rotated and side bent right Sacrum right on right    Impression and Recommendations:     The above documentation has been reviewed and is accurate and complete Lyndal Pulley, DO       Note: This dictation was prepared with Dragon dictation along with smaller phrase technology. Any transcriptional errors that result from this process are unintentional.

## 2020-05-02 NOTE — Assessment & Plan Note (Signed)
Trial of a 5-day burst of colchicine for.  Warned of potential side effects.  Stay active.  Follow-up again in 4 to 8 weeks

## 2020-05-02 NOTE — Assessment & Plan Note (Signed)
  CPPD will consider the colchicine first.  Worsening pain will do another injection.

## 2020-05-08 DIAGNOSIS — F329 Major depressive disorder, single episode, unspecified: Secondary | ICD-10-CM | POA: Diagnosis not present

## 2020-05-08 DIAGNOSIS — M25561 Pain in right knee: Secondary | ICD-10-CM | POA: Diagnosis not present

## 2020-05-08 DIAGNOSIS — Z681 Body mass index (BMI) 19 or less, adult: Secondary | ICD-10-CM | POA: Diagnosis not present

## 2020-05-08 DIAGNOSIS — E785 Hyperlipidemia, unspecified: Secondary | ICD-10-CM | POA: Diagnosis not present

## 2020-05-08 DIAGNOSIS — Z96651 Presence of right artificial knee joint: Secondary | ICD-10-CM | POA: Diagnosis not present

## 2020-05-08 DIAGNOSIS — Z87891 Personal history of nicotine dependence: Secondary | ICD-10-CM | POA: Diagnosis not present

## 2020-05-09 DIAGNOSIS — H04123 Dry eye syndrome of bilateral lacrimal glands: Secondary | ICD-10-CM | POA: Diagnosis not present

## 2020-06-12 ENCOUNTER — Ambulatory Visit: Payer: Federal, State, Local not specified - PPO | Admitting: Family Medicine

## 2020-06-25 ENCOUNTER — Encounter: Payer: Self-pay | Admitting: Family Medicine

## 2020-06-25 ENCOUNTER — Ambulatory Visit: Payer: Federal, State, Local not specified - PPO | Admitting: Family Medicine

## 2020-06-25 ENCOUNTER — Other Ambulatory Visit: Payer: Self-pay

## 2020-06-25 DIAGNOSIS — M999 Biomechanical lesion, unspecified: Secondary | ICD-10-CM

## 2020-06-25 DIAGNOSIS — M5136 Other intervertebral disc degeneration, lumbar region: Secondary | ICD-10-CM | POA: Diagnosis not present

## 2020-06-25 NOTE — Patient Instructions (Signed)
[place] with lifting Monitor the back we can always get MRI See me again in for OMT for 6-8 weeks

## 2020-06-25 NOTE — Progress Notes (Signed)
[facility] [address] Phone: [phone] Subjective:     This visit occurred during the SARS-CoV-2 public health emergency.  Safety protocols were in place, including screening questions prior to the visit, additional usage of staff PPE, and extensive cleaning of exam room while observing appropriate contact time as indicated for disinfecting solutions.   I'm seeing this patient by the request  of:  Denita Lung, MD  CC: Low back pain follow-up  [id number]   05/02/2020 degenerative disc disease of lumbar spine.  Discussed which activities to do which wants to avoid.  Patient has history of multiple different epidurals previously.  I would like to get x-rays today and if continuing to have any radicular symptoms will consider the possibility of MRI.  Patient is in agreement with the plan.  CPPD will consider the colchicine first.  Worsening pain will do another injection.  DO (Physician)              Trial of a 5-day burst of colchicine for.  Warned of potential side effects.  Stay active.  Follow-up again in 4 to 8 weeks       Update 06/25/2020 Janat Tabbert Lochner is a 64 y.o. female coming in with complaint of thumb, back and neck pain. Patient states continues to have some mild discomfort and pain overall.  Never without a day of pain in the back.  Known to have degenerative disc disease of the lumbar spine and has been told to have spinal stenosis.  Continues to try to stay active.  Medications patient has been prescribed: Gabapentin, meloxicam          Reviewed prior external information including notes and imaging from previsou exam, outside providers and external EMR if available.   As well as notes that were available from care everywhere and other healthcare systems.  Past medical history, social, surgical and family history all reviewed in electronic medical record.  No pertanent information unless stated  regarding to the chief complaint.   Past Medical History:  Diagnosis Date  . Anxiety   . Arthritis   . Cervical dysplasia 1990   CIN 2 subsequent cryosurgery. All Paps normal afterwards  . Dyslipidemia   . Herpes genitalis   . Herpes labialis   . Hypercholesteremia   . IBS (irritable bowel syndrome)   . Osteopenia 04/2014   T score -1.7 FRAX 7.3%/0.8% stable from prior DEXA 2013  . Raynaud disease     Allergies  Allergen Reactions  . Morphine And Related Nausea Only     Review of Systems:  No headache, visual changes, nausea, vomiting, diarrhea, constipation, dizziness, abdominal pain, skin rash, fevers, chills, night sweats, weight loss, swollen lymph nodes, body aches, joint swelling, chest pain, shortness of breath, mood changes. POSITIVE muscle aches  Objective    General: No apparent distress alert and oriented x3 mood and affect normal, dressed appropriately.  HEENT: Pupils equal, extraocular movements intact  Respiratory: Patient's speak in full sentences and does not appear short of breath  Cardiovascular: No lower extremity edema, non tender, no erythema  Neuro: Cranial nerves II through XII are intact, neurovascularly intact in all extremities with 2+ DTRs and 2+ pulses.  Gait mild arthritic changes of multiple joints still antalgic gait noted favoring the right knee right knee replacement noted without full range of motion  Back -tightness noted in the lower back.  Patient has some tightness of the hip flexors bilaterally.  Negative straight leg test.  Tightness of the right hamstring but seems to be patient's baseline.  Neurovascularly intact distally.  Osteopathic findings   T5 extended rotated and side bent left T9 extended rotated and side bent right L1 flexed rotated and side bent right L5 flexed rotated and side bent left Sacrum right on right       Assessment and Plan:  Degenerative disc disease, lumbar Moderate to severe overall.  Patient is  stable.  Responding fairly well to osteopathic manipulation.  Continue the gabapentin.  Discussed with patient that we could do MRI and epidurals but patient would like to avoid that if possible.  Increase activity slowly.  Follow-up again in 6 to 8 weeks    Nonallopathic problems  Decision today to treat with OMT was based on Physical Exam  After verbal consent patient was treated with HVLA, ME, FPR techniques in  thoracic, lumbar, and sacral  areas  Patient tolerated the procedure well with improvement in symptoms  Patient given exercises, stretches and lifestyle modifications  See medications in patient instructions if given  Patient will follow up in 4-8 weeks      The above documentation has been reviewed and is accurate and complete Lyndal Pulley, DO       Note: This dictation was prepared with Dragon dictation along with smaller phrase technology. Any transcriptional errors that result from this process are unintentional.

## 2020-06-25 NOTE — Assessment & Plan Note (Signed)
Moderate to severe overall.  Patient is stable.  Responding fairly well to osteopathic manipulation.  Continue the gabapentin.  Discussed with patient that we could do MRI and epidurals but patient would like to avoid that if possible.  Increase activity slowly.  Follow-up again in 6 to 8 weeks

## 2020-07-23 ENCOUNTER — Encounter: Payer: Self-pay | Admitting: Family Medicine

## 2020-07-23 ENCOUNTER — Other Ambulatory Visit: Payer: Self-pay

## 2020-07-23 ENCOUNTER — Ambulatory Visit: Payer: Federal, State, Local not specified - PPO | Admitting: Family Medicine

## 2020-07-23 VITALS — BP 128/76 | HR 58 | Temp 96.3°F | Wt 117.0 lb

## 2020-07-23 DIAGNOSIS — R42 Dizziness and giddiness: Secondary | ICD-10-CM | POA: Diagnosis not present

## 2020-07-23 NOTE — Progress Notes (Signed)
   Subjective:    Patient ID: Bethany [name], female    DOB: 03/18/1956, 64 y.o.   MRN: [id number]  HPI She has a 33-month history of difficulty with feeling lightheaded.  She notes this especially when she goes from a lying to a standing position.  She states that it can last for at least a minute.  She is not noted any blurred or double vision, heart rate changes, weakness, numbness or tingling.  She also notes that she has had some difficulty at other times when she is up walking around where she will become slightly dizzy again with no associated other symptoms. She cannot relate this to any head position.  Review of Systems     Objective:   Physical Exam Alert and in no distress.  EOMI.  DTRs normal.  Tympanic membranes and canals are normal. Pharyngeal area is normal. Neck is supple without adenopathy or thyromegaly. Cardiac exam shows a regular sinus rhythm without murmurs or gallops. Lungs are clear to auscultation. EKG shows a bradycardia but otherwise normal EKG.       Assessment & Plan:  Dizziness - Plan: CBC with Differential/Platelet, Comprehensive metabolic panel, TSH, EKG 74-FSEL Some of the symptoms are postural in nature however it is bothersome that she is sometimes having dizziness when she is up walking around.

## 2020-07-24 DIAGNOSIS — Z9889 Other specified postprocedural states: Secondary | ICD-10-CM | POA: Diagnosis not present

## 2020-07-24 DIAGNOSIS — H02886 Meibomian gland dysfunction of left eye, unspecified eyelid: Secondary | ICD-10-CM | POA: Diagnosis not present

## 2020-07-24 DIAGNOSIS — H02883 Meibomian gland dysfunction of right eye, unspecified eyelid: Secondary | ICD-10-CM | POA: Diagnosis not present

## 2020-07-24 DIAGNOSIS — H18523 Epithelial (juvenile) corneal dystrophy, bilateral: Secondary | ICD-10-CM | POA: Diagnosis not present

## 2020-07-24 LAB — COMPREHENSIVE METABOLIC PANEL
ALT: 28 IU/L (ref 0–32)
AST: 44 IU/L — ABNORMAL HIGH (ref 0–40)
Albumin/Globulin Ratio: 1.8 (ref 1.2–2.2)
Albumin: 4.6 g/dL (ref 3.8–4.8)
Alkaline Phosphatase: 77 IU/L (ref 44–121)
BUN/Creatinine Ratio: 15 (ref 12–28)
BUN: 13 mg/dL (ref 8–27)
Bilirubin Total: 0.3 mg/dL (ref 0.0–1.2)
CO2: 26 mmol/L (ref 20–29)
Calcium: 9.8 mg/dL (ref 8.7–10.3)
Chloride: 100 mmol/L (ref 96–106)
Creatinine, Ser: 0.87 mg/dL (ref 0.57–1.00)
GFR calc Af Amer: 81 mL/min/{1.73_m2} (ref >59)
GFR calc non Af Amer: 71 mL/min/{1.73_m2} (ref >59)
Globulin, Total: 2.6 g/dL (ref 1.5–4.5)
Glucose: 81 mg/dL (ref 65–99)
Potassium: 5.1 mmol/L (ref 3.5–5.2)
Sodium: 141 mmol/L (ref 134–144)
Total Protein: 7.2 g/dL (ref 6.0–8.5)

## 2020-07-24 LAB — CBC WITH DIFFERENTIAL/PLATELET
Basophils Absolute: 0.1 10*3/uL (ref 0.0–0.2)
Basos: 1 %
EOS (ABSOLUTE): 0.1 10*3/uL (ref 0.0–0.4)
Eos: 1 %
Hematocrit: 38.5 % (ref 34.0–46.6)
Hemoglobin: 12.9 g/dL (ref 11.1–15.9)
Immature Grans (Abs): 0 10*3/uL (ref 0.0–0.1)
Immature Granulocytes: 0 %
Lymphocytes Absolute: 1.6 10*3/uL (ref 0.7–3.1)
Lymphs: 28 %
MCH: 33 pg (ref 26.6–33.0)
MCHC: 33.5 g/dL (ref 31.5–35.7)
MCV: 99 fL — ABNORMAL HIGH (ref 79–97)
Monocytes Absolute: 0.4 10*3/uL (ref 0.1–0.9)
Monocytes: 6 %
Neutrophils Absolute: 3.6 10*3/uL (ref 1.4–7.0)
Neutrophils: 64 %
Platelets: 188 10*3/uL (ref 150–450)
RBC: 3.91 x10E6/uL (ref 3.77–5.28)
RDW: 11.8 % (ref 11.7–15.4)
WBC: 5.6 10*3/uL (ref 3.4–10.8)

## 2020-07-24 LAB — TSH: TSH: 2.49 u[IU]/mL (ref 0.450–4.500)

## 2020-08-02 DIAGNOSIS — H02886 Meibomian gland dysfunction of left eye, unspecified eyelid: Secondary | ICD-10-CM | POA: Insufficient documentation

## 2020-08-02 DIAGNOSIS — H18523 Epithelial (juvenile) corneal dystrophy, bilateral: Secondary | ICD-10-CM | POA: Insufficient documentation

## 2020-08-02 DIAGNOSIS — Z9889 Other specified postprocedural states: Secondary | ICD-10-CM

## 2020-08-02 DIAGNOSIS — H02883 Meibomian gland dysfunction of right eye, unspecified eyelid: Secondary | ICD-10-CM | POA: Insufficient documentation

## 2020-08-02 HISTORY — DX: Meibomian gland dysfunction of right eye, unspecified eyelid: H02.883

## 2020-08-02 HISTORY — DX: Epithelial (juvenile) corneal dystrophy, bilateral: H18.523

## 2020-08-02 HISTORY — DX: Other specified postprocedural states: Z98.890

## 2020-08-13 ENCOUNTER — Ambulatory Visit: Payer: Federal, State, Local not specified - PPO | Admitting: Family Medicine

## 2020-08-13 NOTE — Progress Notes (Deleted)
[facility] [place] Phone: [phone] Subjective:    I'm seeing this patient by the request  of:  Denita Lung, MD  CC:   [id number]  Bethany [name] is a 64 y.o. female coming in with complaint of back and neck pain. OMT 06/25/2020. Patient states   Medications patient has been prescribed:   Taking:         Reviewed prior external information including notes and imaging from previsou exam, outside providers and external EMR if available.   As well as notes that were available from care everywhere and other healthcare systems.  Past medical history, social, surgical and family history all reviewed in electronic medical record.  No pertanent information unless stated regarding to the chief complaint.   Past Medical History:  Diagnosis Date   Anxiety    Arthritis    Cervical dysplasia 1990   CIN 2 subsequent cryosurgery. All Paps normal afterwards   Dyslipidemia    Herpes genitalis    Herpes labialis    Hypercholesteremia    IBS (irritable bowel syndrome)    Osteopenia 04/2014   T score -1.7 FRAX 7.3%/0.8% stable from prior DEXA 2013   Raynaud disease     Allergies  Allergen Reactions   Morphine And Related Nausea Only     Review of Systems:  No headache, visual changes, nausea, vomiting, diarrhea, constipation, dizziness, abdominal pain, skin rash, fevers, chills, night sweats, weight loss, swollen lymph nodes, body aches, joint swelling, chest pain, shortness of breath, mood changes. POSITIVE muscle aches  Objective  There were no vitals taken for this visit.   General: No apparent distress alert and oriented x3 mood and affect normal, dressed appropriately.  HEENT: Pupils equal, extraocular movements intact  Respiratory: Patient's speak in full sentences and does not appear short of breath  Cardiovascular: No lower extremity edema, non tender, no erythema  Neuro: Cranial  nerves II through XII are intact, neurovascularly intact in all extremities with 2+ DTRs and 2+ pulses.  Gait normal with good balance and coordination.  MSK:  Non tender with full range of motion and good stability and symmetric strength and tone of shoulders, elbows, wrist, hip, knee and ankles bilaterally.  Back - Normal skin, Spine with normal alignment and no deformity.  No tenderness to vertebral process palpation.  Paraspinous muscles are not tender and without spasm.   Range of motion is full at neck and lumbar sacral regions  Osteopathic findings  C2 flexed rotated and side bent right C6 flexed rotated and side bent left T3 extended rotated and side bent right inhaled rib T9 extended rotated and side bent left L2 flexed rotated and side bent right Sacrum right on right       Assessment and Plan:    Nonallopathic problems  Decision today to treat with OMT was based on Physical Exam  After verbal consent patient was treated with HVLA, ME, FPR techniques in cervical, rib, thoracic, lumbar, and sacral  areas  Patient tolerated the procedure well with improvement in symptoms  Patient given exercises, stretches and lifestyle modifications  See medications in patient instructions if given  Patient will follow up in 4-8 weeks      The above documentation has been reviewed and is accurate and complete Lyndal Pulley, DO       Note: This dictation was prepared with Dragon dictation along with smaller phrase technology. Any transcriptional errors that result from this  process are unintentional.

## 2020-08-20 DIAGNOSIS — Z85828 Personal history of other malignant neoplasm of skin: Secondary | ICD-10-CM | POA: Diagnosis not present

## 2020-08-20 DIAGNOSIS — L821 Other seborrheic keratosis: Secondary | ICD-10-CM | POA: Diagnosis not present

## 2020-08-20 DIAGNOSIS — L820 Inflamed seborrheic keratosis: Secondary | ICD-10-CM | POA: Diagnosis not present

## 2020-08-20 DIAGNOSIS — D1801 Hemangioma of skin and subcutaneous tissue: Secondary | ICD-10-CM | POA: Diagnosis not present

## 2020-08-20 DIAGNOSIS — L570 Actinic keratosis: Secondary | ICD-10-CM | POA: Diagnosis not present

## 2020-08-20 DIAGNOSIS — L812 Freckles: Secondary | ICD-10-CM | POA: Diagnosis not present

## 2020-08-20 NOTE — Progress Notes (Deleted)
[facility] [place] Phone: [phone] Subjective:    I'm seeing this patient by the request  of:  Denita Lung, MD  CC:   [id number]  Bethany [name] is a 64 y.o. female coming in with complaint of back and neck pain. OMT 06/25/2020. Patient states   Medications patient has been prescribed:   Taking:         Reviewed prior external information including notes and imaging from previsou exam, outside providers and external EMR if available.   As well as notes that were available from care everywhere and other healthcare systems.  Past medical history, social, surgical and family history all reviewed in electronic medical record.  No pertanent information unless stated regarding to the chief complaint.   Past Medical History:  Diagnosis Date  . Anxiety   . Arthritis   . Cervical dysplasia 1990   CIN 2 subsequent cryosurgery. All Paps normal afterwards  . Dyslipidemia   . Herpes genitalis   . Herpes labialis   . Hypercholesteremia   . IBS (irritable bowel syndrome)   . Osteopenia 04/2014   T score -1.7 FRAX 7.3%/0.8% stable from prior DEXA 2013  . Raynaud disease     Allergies  Allergen Reactions  . Morphine And Related Nausea Only     Review of Systems:  No headache, visual changes, nausea, vomiting, diarrhea, constipation, dizziness, abdominal pain, skin rash, fevers, chills, night sweats, weight loss, swollen lymph nodes, body aches, joint swelling, chest pain, shortness of breath, mood changes. POSITIVE muscle aches  Objective  There were no vitals taken for this visit.   General: No apparent distress alert and oriented x3 mood and affect normal, dressed appropriately.  HEENT: Pupils equal, extraocular movements intact  Respiratory: Patient's speak in full sentences and does not appear short of breath  Cardiovascular: No lower extremity edema, non tender, no erythema  Neuro: Cranial  nerves II through XII are intact, neurovascularly intact in all extremities with 2+ DTRs and 2+ pulses.  Gait normal with good balance and coordination.  MSK:  Non tender with full range of motion and good stability and symmetric strength and tone of shoulders, elbows, wrist, hip, knee and ankles bilaterally.  Back - Normal skin, Spine with normal alignment and no deformity.  No tenderness to vertebral process palpation.  Paraspinous muscles are not tender and without spasm.   Range of motion is full at neck and lumbar sacral regions  Osteopathic findings  C2 flexed rotated and side bent right C6 flexed rotated and side bent left T3 extended rotated and side bent right inhaled rib T9 extended rotated and side bent left L2 flexed rotated and side bent right Sacrum right on right       Assessment and Plan:    Nonallopathic problems  Decision today to treat with OMT was based on Physical Exam  After verbal consent patient was treated with HVLA, ME, FPR techniques in cervical, rib, thoracic, lumbar, and sacral  areas  Patient tolerated the procedure well with improvement in symptoms  Patient given exercises, stretches and lifestyle modifications  See medications in patient instructions if given  Patient will follow up in 4-8 weeks      The above documentation has been reviewed and is accurate and complete Jacqualin Combes       Note: This dictation was prepared with Dragon dictation along with smaller phrase technology. Any transcriptional errors that result from this process are  unintentional.

## 2020-08-21 ENCOUNTER — Ambulatory Visit: Payer: Federal, State, Local not specified - PPO | Admitting: Family Medicine

## 2020-08-28 ENCOUNTER — Other Ambulatory Visit: Payer: Federal, State, Local not specified - PPO

## 2020-08-28 ENCOUNTER — Other Ambulatory Visit: Payer: Self-pay

## 2020-08-28 ENCOUNTER — Ambulatory Visit (INDEPENDENT_AMBULATORY_CARE_PROVIDER_SITE_OTHER): Payer: Federal, State, Local not specified - PPO

## 2020-08-28 DIAGNOSIS — H02836 Dermatochalasis of left eye, unspecified eyelid: Secondary | ICD-10-CM | POA: Diagnosis not present

## 2020-08-28 DIAGNOSIS — Z23 Encounter for immunization: Secondary | ICD-10-CM | POA: Diagnosis not present

## 2020-08-28 DIAGNOSIS — H02833 Dermatochalasis of right eye, unspecified eyelid: Secondary | ICD-10-CM | POA: Diagnosis not present

## 2020-08-28 DIAGNOSIS — H16223 Keratoconjunctivitis sicca, not specified as Sjogren's, bilateral: Secondary | ICD-10-CM | POA: Diagnosis not present

## 2020-08-30 ENCOUNTER — Ambulatory Visit: Payer: Federal, State, Local not specified - PPO | Admitting: Family Medicine

## 2020-08-30 ENCOUNTER — Ambulatory Visit (INDEPENDENT_AMBULATORY_CARE_PROVIDER_SITE_OTHER): Payer: Federal, State, Local not specified - PPO

## 2020-08-30 ENCOUNTER — Encounter: Payer: Self-pay | Admitting: Family Medicine

## 2020-08-30 ENCOUNTER — Other Ambulatory Visit: Payer: Self-pay

## 2020-08-30 VITALS — BP 122/80 | HR 52 | Ht 64.0 in | Wt 117.0 lb

## 2020-08-30 DIAGNOSIS — M999 Biomechanical lesion, unspecified: Secondary | ICD-10-CM | POA: Diagnosis not present

## 2020-08-30 DIAGNOSIS — M5136 Other intervertebral disc degeneration, lumbar region: Secondary | ICD-10-CM

## 2020-08-30 DIAGNOSIS — R638 Other symptoms and signs concerning food and fluid intake: Secondary | ICD-10-CM

## 2020-08-30 DIAGNOSIS — M79645 Pain in left finger(s): Secondary | ICD-10-CM

## 2020-08-30 DIAGNOSIS — M1812 Unilateral primary osteoarthritis of first carpometacarpal joint, left hand: Secondary | ICD-10-CM

## 2020-08-30 DIAGNOSIS — M79644 Pain in right finger(s): Secondary | ICD-10-CM

## 2020-08-30 DIAGNOSIS — Z8739 Personal history of other diseases of the musculoskeletal system and connective tissue: Secondary | ICD-10-CM | POA: Diagnosis not present

## 2020-08-30 MED ORDER — ALLOPURINOL 100 MG PO TABS: 200.0000 mg | ORAL_TABLET | Freq: Every day | ORAL | 3 refills | Status: DC

## 2020-08-30 NOTE — Assessment & Plan Note (Signed)
Patient has had signs and symptoms consistent with CPPD as well as the potential for the [facility] arthritis.  X-rays ordered, discussed the potential use of allopurinol.  Patient's creatinine is within normal range and GFR is greater than 80.  Patient will come back again in 5 to 6 weeks.  Warned patient to discontinue the allopurinol if any rash occurs.

## 2020-08-30 NOTE — Assessment & Plan Note (Signed)
Chronic problem.  Bone density ordered today as well secondary to patient's increasing in discomfort and pain.  Follow-up with me again in 5 to 6 weeks.  Responded well to manipulation

## 2020-08-30 NOTE — Patient Instructions (Addendum)
Good to see you Bone density schedule up front Allopurinol 200 mg daily stop if you get a rash Myfitness pal food journal for 5-7 days I want at least 2,000 calories a day and aiming for 50 grams of protien Hand xray today See me again in 5-6 weeks

## 2020-08-30 NOTE — Assessment & Plan Note (Signed)
Patient does seem to be somewhat vomiting.  Has lost some muscle mass and continues to stay active.  We discussed keeping a food journal for now and increasing protein intake.  Patient's recent labs from primary care provider when reviewed look fairly unremarkable.  We will consider further work-up if this continues to be a difficulty.  Follow-up again in 5 weeks

## 2020-08-30 NOTE — Progress Notes (Signed)
[facility] [address] [place] Phone: [phone] Subjective:   I Bethany [name] am serving as a Education administrator for Dr. Hulan Saas.  This visit occurred during the SARS-CoV-2 public health emergency.  Safety protocols were in place, including screening questions prior to the visit, additional usage of staff PPE, and extensive cleaning of exam room while observing appropriate contact time as indicated for disinfecting solutions.   I'm seeing this patient by the request  of:  Denita Lung, MD  CC: Back pain follow-up, bilateral hand pain  [id number]  Bethany [name] is a 64 y.o. female coming in with complaint of back and neck pain. OMT 06/25/2020. Patient states she is doing well. Still having pain in thumbs bilaterally. Pain is not all the time. States her pain is sharp.  States that it is severe and not stopping her from activity but make certain things more difficult.  Even has some difficulty with daily activities.  States that it does seem to sometimes swell and not swell from time to time.  Patient has also noticed that it has been difficult for her to gain weight.  Not losing weight but finds it difficult.  Medications patient has been prescribed: Meloxicam, Singulair  Taking: Intermittently         Reviewed prior external information including notes and imaging from previsou exam, outside providers and external EMR if available.   As well as notes that were available from care everywhere and other healthcare systems.  Past medical history, social, surgical and family history all reviewed in electronic medical record.  No pertanent information unless stated regarding to the chief complaint.   Past Medical History:  Diagnosis Date  . Anxiety   . Arthritis   . Cervical dysplasia 1990   CIN 2 subsequent cryosurgery. All Paps normal afterwards  . Dyslipidemia   . Herpes genitalis   . Herpes labialis   .  Hypercholesteremia   . IBS (irritable bowel syndrome)   . Osteopenia 04/2014   T score -1.7 FRAX 7.3%/0.8% stable from prior DEXA 2013  . Raynaud disease     Allergies  Allergen Reactions  . Morphine And Related Nausea Only     Review of Systems:  No headache, visual changes, nausea, vomiting, diarrhea, constipation, dizziness, abdominal pain, skin rash, fevers, chills, night sweats, weight loss, swollen lymph nodes, body aches, joint swelling, chest pain, shortness of breath, mood changes. POSITIVE muscle aches  Objective  Blood pressure 122/80, pulse (!) 52, height 5\' 4"  (1.626 m), weight 117 lb (53.1 kg), SpO2 93 %.   General: No apparent distress alert and oriented x3 mood and affect normal, dressed appropriately.  Patient has lost more weight since we have seen him HEENT: Pupils equal, extraocular movements intact  Respiratory: Patient's speak in full sentences and does not appear short of breath  Cardiovascular: No lower extremity edema, non tender, no erythema  Still mild antalgic gait Hand exam shows the patient does have some mild CMC arthritis with a positive grind test on the left and very mild on the right.  Patient does have good grip strength still remaining.  Negative Finkelstein test bilaterally. Back -low back does have some very mild loss of lordosis and some mild degenerative scoliosis.  Patient does have tightness noted in the paraspinal musculature right greater than left.  Mild tightness on the right side of the back.  Osteopathic findings  T8 extended rotated and side bent left L2 flexed rotated  and side bent right Sacrum right on right   Wt Readings from Last 3 Encounters:  08/30/20 117 lb (53.1 kg)  07/23/20 117 lb (53.1 kg)  05/02/20 116 lb (52.6 kg)       Assessment and Plan:  Arthritis of carpometacarpal (CMC) joint of left thumb Patient has had signs and symptoms consistent with CPPD as well as the potential for the [facility] arthritis.  X-rays  ordered, discussed the potential use of allopurinol.  Patient's creatinine is within normal range and GFR is greater than 80.  Patient will come back again in 5 to 6 weeks.  Warned patient to discontinue the allopurinol if any rash occurs.  Degenerative disc disease, lumbar Chronic problem.  Bone density ordered today as well secondary to patient's increasing in discomfort and pain.  Follow-up with me again in 5 to 6 weeks.  Responded well to manipulation  Weight disorder Patient does seem to be somewhat vomiting.  Has lost some muscle mass and continues to stay active.  We discussed keeping a food journal for now and increasing protein intake.  Patient's recent labs from primary care provider when reviewed look fairly unremarkable.  We will consider further work-up if this continues to be a difficulty.  Follow-up again in 5 weeks    Nonallopathic problems  Decision today to treat with OMT was based on Physical Exam  After verbal consent patient was treated with HVLA, ME, FPR techniques in  thoracic, lumbar, and sacral  areas  Patient tolerated the procedure well with improvement in symptoms  Patient given exercises, stretches and lifestyle modifications  See medications in patient instructions if given  Patient will follow up in 4-8 weeks      The above documentation has been reviewed and is accurate and complete Lyndal Pulley, DO       Note: This dictation was prepared with Dragon dictation along with smaller phrase technology. Any transcriptional errors that result from this process are unintentional.

## 2020-09-02 MED ORDER — VITAMIN D (ERGOCALCIFEROL) 1.25 MG (50000 UNIT) PO CAPS: 50000.0000 [IU] | ORAL_CAPSULE | ORAL | 0 refills | Status: DC

## 2020-09-03 ENCOUNTER — Other Ambulatory Visit: Payer: Self-pay

## 2020-09-03 ENCOUNTER — Ambulatory Visit (INDEPENDENT_AMBULATORY_CARE_PROVIDER_SITE_OTHER)
Admission: RE | Admit: 2020-09-03 | Discharge: 2020-09-03 | Disposition: A | Discharge status: Home / Self Care | Payer: Federal, State, Local not specified - PPO | Source: Ambulatory Visit | Attending: Family Medicine | Admitting: Family Medicine

## 2020-09-03 ENCOUNTER — Other Ambulatory Visit: Payer: Federal, State, Local not specified - PPO

## 2020-09-03 DIAGNOSIS — M79645 Pain in left finger(s): Secondary | ICD-10-CM

## 2020-09-03 DIAGNOSIS — M79644 Pain in right finger(s): Secondary | ICD-10-CM

## 2020-09-03 DIAGNOSIS — F1521 Other stimulant dependence, in remission: Secondary | ICD-10-CM | POA: Diagnosis not present

## 2020-09-03 DIAGNOSIS — F411 Generalized anxiety disorder: Secondary | ICD-10-CM | POA: Diagnosis not present

## 2020-09-03 DIAGNOSIS — F1421 Cocaine dependence, in remission: Secondary | ICD-10-CM | POA: Diagnosis not present

## 2020-09-03 DIAGNOSIS — F1121 Opioid dependence, in remission: Secondary | ICD-10-CM | POA: Diagnosis not present

## 2020-09-08 DIAGNOSIS — M79644 Pain in right finger(s): Secondary | ICD-10-CM | POA: Diagnosis not present

## 2020-09-08 DIAGNOSIS — M79645 Pain in left finger(s): Secondary | ICD-10-CM | POA: Diagnosis not present

## 2020-09-11 ENCOUNTER — Other Ambulatory Visit: Payer: Self-pay | Admitting: Family Medicine

## 2020-09-20 ENCOUNTER — Telehealth: Payer: Self-pay | Admitting: Family Medicine

## 2020-09-20 NOTE — Telephone Encounter (Signed)
Patient called stating that she thinks she did something to her back. She said that when she bent over yesterday she felt something pop and is having pain in her lower back.  She asked if she could be worked in on Tuesday with Dr Tamala Julian.  Please advise.

## 2020-09-21 ENCOUNTER — Other Ambulatory Visit: Payer: Self-pay | Admitting: Family Medicine

## 2020-09-21 ENCOUNTER — Encounter: Payer: Self-pay | Admitting: Family Medicine

## 2020-09-21 MED ORDER — HYDROCODONE-ACETAMINOPHEN 5-325 MG PO TABS: 1.0000 | ORAL_TABLET | Freq: Four times a day (QID) | ORAL | 0 refills | Status: DC | PRN

## 2020-09-23 ENCOUNTER — Ambulatory Visit
Admission: RE | Admit: 2020-09-23 | Discharge: 2020-09-23 | Disposition: A | Discharge status: Home / Self Care | Payer: Federal, State, Local not specified - PPO | Source: Ambulatory Visit | Attending: Family Medicine | Admitting: Family Medicine

## 2020-09-23 ENCOUNTER — Ambulatory Visit: Payer: Federal, State, Local not specified - PPO | Admitting: Family Medicine

## 2020-09-23 ENCOUNTER — Encounter: Payer: Self-pay | Admitting: Family Medicine

## 2020-09-23 ENCOUNTER — Other Ambulatory Visit: Payer: Self-pay

## 2020-09-23 VITALS — BP 132/84 | HR 73 | Temp 97.4°F | Wt 116.1 lb

## 2020-09-23 DIAGNOSIS — M461 Sacroiliitis, not elsewhere classified: Secondary | ICD-10-CM

## 2020-09-23 DIAGNOSIS — M545 Low back pain, unspecified: Secondary | ICD-10-CM | POA: Diagnosis not present

## 2020-09-23 DIAGNOSIS — M533 Sacrococcygeal disorders, not elsewhere classified: Secondary | ICD-10-CM | POA: Diagnosis not present

## 2020-09-23 MED ORDER — OXYCODONE-ACETAMINOPHEN 7.5-325 MG PO TABS: 1.0000 | ORAL_TABLET | ORAL | 0 refills | Status: DC | PRN

## 2020-09-23 NOTE — Progress Notes (Signed)
   Subjective:    Patient ID: Bethany [name], female    DOB: 1956-06-08, 64 y.o.   MRN: [id number]  HPI She states that last Thursday while bending forward she developed some right-sided low back pain with radiation into the gluteal area.  She did feel some weakness in her right leg but no numbness, tingling or weakness.  She has a previous history of difficulty with SI joint pain and subsequent injection.  This was several years ago.  She called over the weekend and I did give her hydrocodone to help with this however it did not give her adequate relief.   Review of Systems     Objective:   Physical Exam Alert and looking uncomfortable.  Splinting noted when getting out of a chair.  Pain on forward flexion.  Tender to palpation over the right SI joint.  FABER testing caused pain.  Full hip motion.  Straight leg raising was questionably positive at 45 degrees on the right.  DTRs and strength normal.       Assessment & Plan:  Sacroiliitis ([place]) - Plan: DG Lumbar Spine Complete, oxyCODONE-acetaminophen (PERCOCET) 7.5-325 MG tablet, DG Si Joints Discussed the treatment of the SI joint dysfunction in terms of heat, stretching and pain management.  We will get x-rays to make sure nothing else is going on.  Discussed the possibility of further evaluation and treatment including SI joint injection.  She will keep me informed as to how she is doing.

## 2020-09-23 NOTE — Patient Instructions (Signed)
Heat for 20 minutes 3 times per day and gentle stretching after that

## 2020-09-23 NOTE — Telephone Encounter (Signed)
Can we check in on her and see how she is dong? If better I would like to hold on the office visit with [place] being quite full that day.  Otherwise If not better lets try to find a slot to see her. Maybe wait to see if there is a cancellation

## 2020-09-23 NOTE — Telephone Encounter (Signed)
Spoke with patient who is going to see her PCP today and will let us know if she needs to schedule with [place] in the near future.

## 2020-09-30 ENCOUNTER — Telehealth (INDEPENDENT_AMBULATORY_CARE_PROVIDER_SITE_OTHER): Payer: Federal, State, Local not specified - PPO | Admitting: Family Medicine

## 2020-09-30 ENCOUNTER — Encounter: Payer: Self-pay | Admitting: Family Medicine

## 2020-09-30 ENCOUNTER — Other Ambulatory Visit: Payer: Self-pay

## 2020-09-30 ENCOUNTER — Inpatient Hospital Stay (HOSPITAL_COMMUNITY)
Admission: EM | Admit: 2020-09-30 | Discharge: 2020-10-03 | DRG: 390 | Disposition: A | Discharge status: Home / Self Care | Payer: Federal, State, Local not specified - PPO | Attending: Internal Medicine | Admitting: Internal Medicine

## 2020-09-30 ENCOUNTER — Encounter (HOSPITAL_COMMUNITY): Payer: Self-pay

## 2020-09-30 VITALS — Temp 97.0°F | Wt 116.0 lb

## 2020-09-30 DIAGNOSIS — R1013 Epigastric pain: Secondary | ICD-10-CM

## 2020-09-30 DIAGNOSIS — Z85828 Personal history of other malignant neoplasm of skin: Secondary | ICD-10-CM | POA: Diagnosis not present

## 2020-09-30 DIAGNOSIS — Z20822 Contact with and (suspected) exposure to covid-19: Secondary | ICD-10-CM | POA: Diagnosis present

## 2020-09-30 DIAGNOSIS — M1990 Unspecified osteoarthritis, unspecified site: Secondary | ICD-10-CM | POA: Diagnosis present

## 2020-09-30 DIAGNOSIS — D539 Nutritional anemia, unspecified: Secondary | ICD-10-CM | POA: Diagnosis not present

## 2020-09-30 DIAGNOSIS — R9431 Abnormal electrocardiogram [ECG] [EKG]: Secondary | ICD-10-CM | POA: Diagnosis not present

## 2020-09-30 DIAGNOSIS — E785 Hyperlipidemia, unspecified: Secondary | ICD-10-CM | POA: Diagnosis present

## 2020-09-30 DIAGNOSIS — F39 Unspecified mood [affective] disorder: Secondary | ICD-10-CM | POA: Diagnosis not present

## 2020-09-30 DIAGNOSIS — M8580 Other specified disorders of bone density and structure, unspecified site: Secondary | ICD-10-CM | POA: Diagnosis present

## 2020-09-30 DIAGNOSIS — E7800 Pure hypercholesterolemia, unspecified: Secondary | ICD-10-CM | POA: Diagnosis present

## 2020-09-30 DIAGNOSIS — M109 Gout, unspecified: Secondary | ICD-10-CM | POA: Diagnosis not present

## 2020-09-30 DIAGNOSIS — I7300 Raynaud's syndrome without gangrene: Secondary | ICD-10-CM | POA: Diagnosis present

## 2020-09-30 DIAGNOSIS — Z87891 Personal history of nicotine dependence: Secondary | ICD-10-CM

## 2020-09-30 DIAGNOSIS — Z885 Allergy status to narcotic agent status: Secondary | ICD-10-CM | POA: Diagnosis not present

## 2020-09-30 DIAGNOSIS — Z87410 Personal history of cervical dysplasia: Secondary | ICD-10-CM

## 2020-09-30 DIAGNOSIS — F419 Anxiety disorder, unspecified: Secondary | ICD-10-CM | POA: Diagnosis present

## 2020-09-30 DIAGNOSIS — Z90722 Acquired absence of ovaries, bilateral: Secondary | ICD-10-CM | POA: Diagnosis not present

## 2020-09-30 DIAGNOSIS — Z96651 Presence of right artificial knee joint: Secondary | ICD-10-CM | POA: Diagnosis not present

## 2020-09-30 DIAGNOSIS — Z90710 Acquired absence of both cervix and uterus: Secondary | ICD-10-CM

## 2020-09-30 DIAGNOSIS — K589 Irritable bowel syndrome without diarrhea: Secondary | ICD-10-CM | POA: Diagnosis present

## 2020-09-30 DIAGNOSIS — Z79899 Other long term (current) drug therapy: Secondary | ICD-10-CM

## 2020-09-30 DIAGNOSIS — K56609 Unspecified intestinal obstruction, unspecified as to partial versus complete obstruction: Principal | ICD-10-CM | POA: Diagnosis present

## 2020-09-30 DIAGNOSIS — Z8719 Personal history of other diseases of the digestive system: Secondary | ICD-10-CM

## 2020-09-30 DIAGNOSIS — Z807 Family history of other malignant neoplasms of lymphoid, hematopoietic and related tissues: Secondary | ICD-10-CM

## 2020-09-30 DIAGNOSIS — Z8249 Family history of ischemic heart disease and other diseases of the circulatory system: Secondary | ICD-10-CM

## 2020-09-30 DIAGNOSIS — R030 Elevated blood-pressure reading, without diagnosis of hypertension: Secondary | ICD-10-CM | POA: Diagnosis not present

## 2020-09-30 DIAGNOSIS — D7589 Other specified diseases of blood and blood-forming organs: Secondary | ICD-10-CM | POA: Diagnosis present

## 2020-09-30 DIAGNOSIS — Z803 Family history of malignant neoplasm of breast: Secondary | ICD-10-CM

## 2020-09-30 DIAGNOSIS — F329 Major depressive disorder, single episode, unspecified: Secondary | ICD-10-CM | POA: Diagnosis present

## 2020-09-30 LAB — URINALYSIS, ROUTINE W REFLEX MICROSCOPIC
Bilirubin Urine: NEGATIVE
Glucose, UA: NEGATIVE mg/dL
Hgb urine dipstick: NEGATIVE
Ketones, ur: 20 mg/dL — AB
Leukocytes,Ua: NEGATIVE
Nitrite: NEGATIVE
Protein, ur: 30 mg/dL — AB
Specific Gravity, Urine: 1.024 (ref 1.005–1.030)
pH: 7 (ref 5.0–8.0)

## 2020-09-30 LAB — COMPREHENSIVE METABOLIC PANEL
ALT: 24 U/L (ref 0–44)
AST: 36 U/L (ref 15–41)
Albumin: 4.9 g/dL (ref 3.5–5.0)
Alkaline Phosphatase: 63 U/L (ref 38–126)
Anion gap: 13 (ref 5–15)
BUN: 10 mg/dL (ref 8–23)
CO2: 30 mmol/L (ref 22–32)
Calcium: 10.2 mg/dL (ref 8.9–10.3)
Chloride: 98 mmol/L (ref 98–111)
Creatinine, Ser: 0.94 mg/dL (ref 0.44–1.00)
GFR, Estimated: >60 mL/min (ref >60)
Glucose, Bld: 126 mg/dL — ABNORMAL HIGH (ref 70–99)
Potassium: 4.2 mmol/L (ref 3.5–5.1)
Sodium: 141 mmol/L (ref 135–145)
Total Bilirubin: 0.6 mg/dL (ref 0.3–1.2)
Total Protein: 7.9 g/dL (ref 6.5–8.1)

## 2020-09-30 LAB — LIPASE, BLOOD: Lipase: 25 U/L (ref 11–51)

## 2020-09-30 LAB — CBC
HCT: 46.3 % — ABNORMAL HIGH (ref 36.0–46.0)
Hemoglobin: 15.3 g/dL — ABNORMAL HIGH (ref 12.0–15.0)
MCH: 33.5 pg (ref 26.0–34.0)
MCHC: 33 g/dL (ref 30.0–36.0)
MCV: 101.3 fL — ABNORMAL HIGH (ref 80.0–100.0)
Platelets: 212 10*3/uL (ref 150–400)
RBC: 4.57 MIL/uL (ref 3.87–5.11)
RDW: 11.8 % (ref 11.5–15.5)
WBC: 9.1 10*3/uL (ref 4.0–10.5)
nRBC: 0 % (ref 0.0–0.2)

## 2020-09-30 MED ORDER — ONDANSETRON 4 MG PO TBDP
4.0000 mg | ORAL_TABLET | Freq: Once | ORAL | Status: AC
  Administered 2020-09-30: 4 mg via ORAL
  Filled 2020-09-30: qty 1

## 2020-09-30 MED ORDER — OXYCODONE-ACETAMINOPHEN 5-325 MG PO TABS
1.0000 | ORAL_TABLET | Freq: Once | ORAL | Status: AC
  Administered 2020-09-30: 1 via ORAL
  Filled 2020-09-30: qty 1

## 2020-09-30 NOTE — ED Triage Notes (Signed)
Pt reports generalized abd pain that started around 4am this morning. Nausea but no vomiting. Hx of bowel obstruction in 2019. Pt tearful in triage.

## 2020-09-30 NOTE — Progress Notes (Signed)
   Subjective:    Patient ID: Bethany [name], female    DOB: 1955-11-30, 64 y.o.   MRN: [id number]  HPI I connected with  Bethany [name] on 09/30/20 by a video enabled telemedicine application and verified that I am speaking with the correct person using two identifiers.  caregility used.i'm in office, she is at home I discussed the limitations of evaluation and management by telemedicine. The patient expressed understanding and agreed to proceed. She states that she was awakened in the early morning with midepigastric burning and cramping sensation as well as questionable bloating.  She has been nauseated but has not vomited or had diarrhea.  She has been taking codeine for her back pain.  She has not been using Advil.  Apparently Pepto-Bismol had no benefit.   Review of Systems     Objective:   Physical Exam Alert and visually looking quite uncomfortable.       Assessment & Plan:  Midepigastric pain I will have her try liquid Maalox or Mylanta, at least 2 doses however if her pain continues explained she would need to go to the hospital for further care of this. 20 minutes spent in review of her medical record including medications recent evaluation counseling and coordination of care.

## 2020-10-01 ENCOUNTER — Inpatient Hospital Stay (HOSPITAL_COMMUNITY): Payer: Federal, State, Local not specified - PPO

## 2020-10-01 ENCOUNTER — Encounter (HOSPITAL_COMMUNITY): Payer: Self-pay | Admitting: Internal Medicine

## 2020-10-01 ENCOUNTER — Emergency Department (HOSPITAL_COMMUNITY): Payer: Federal, State, Local not specified - PPO

## 2020-10-01 DIAGNOSIS — Z885 Allergy status to narcotic agent status: Secondary | ICD-10-CM | POA: Diagnosis not present

## 2020-10-01 DIAGNOSIS — Z79899 Other long term (current) drug therapy: Secondary | ICD-10-CM | POA: Diagnosis not present

## 2020-10-01 DIAGNOSIS — Z90710 Acquired absence of both cervix and uterus: Secondary | ICD-10-CM | POA: Diagnosis not present

## 2020-10-01 DIAGNOSIS — K56609 Unspecified intestinal obstruction, unspecified as to partial versus complete obstruction: Secondary | ICD-10-CM | POA: Diagnosis not present

## 2020-10-01 DIAGNOSIS — Z90722 Acquired absence of ovaries, bilateral: Secondary | ICD-10-CM | POA: Diagnosis not present

## 2020-10-01 DIAGNOSIS — M109 Gout, unspecified: Secondary | ICD-10-CM | POA: Diagnosis present

## 2020-10-01 DIAGNOSIS — I7300 Raynaud's syndrome without gangrene: Secondary | ICD-10-CM | POA: Diagnosis present

## 2020-10-01 DIAGNOSIS — Z87891 Personal history of nicotine dependence: Secondary | ICD-10-CM | POA: Diagnosis not present

## 2020-10-01 DIAGNOSIS — Z20822 Contact with and (suspected) exposure to covid-19: Secondary | ICD-10-CM | POA: Diagnosis present

## 2020-10-01 DIAGNOSIS — Z807 Family history of other malignant neoplasms of lymphoid, hematopoietic and related tissues: Secondary | ICD-10-CM | POA: Diagnosis not present

## 2020-10-01 DIAGNOSIS — E785 Hyperlipidemia, unspecified: Secondary | ICD-10-CM | POA: Diagnosis not present

## 2020-10-01 DIAGNOSIS — F419 Anxiety disorder, unspecified: Secondary | ICD-10-CM | POA: Diagnosis present

## 2020-10-01 DIAGNOSIS — Z4682 Encounter for fitting and adjustment of non-vascular catheter: Secondary | ICD-10-CM | POA: Diagnosis not present

## 2020-10-01 DIAGNOSIS — Z87410 Personal history of cervical dysplasia: Secondary | ICD-10-CM | POA: Diagnosis not present

## 2020-10-01 DIAGNOSIS — R030 Elevated blood-pressure reading, without diagnosis of hypertension: Secondary | ICD-10-CM | POA: Diagnosis not present

## 2020-10-01 DIAGNOSIS — M8580 Other specified disorders of bone density and structure, unspecified site: Secondary | ICD-10-CM | POA: Diagnosis present

## 2020-10-01 DIAGNOSIS — F39 Unspecified mood [affective] disorder: Secondary | ICD-10-CM | POA: Diagnosis not present

## 2020-10-01 DIAGNOSIS — D7589 Other specified diseases of blood and blood-forming organs: Secondary | ICD-10-CM | POA: Diagnosis present

## 2020-10-01 DIAGNOSIS — Z96651 Presence of right artificial knee joint: Secondary | ICD-10-CM | POA: Diagnosis present

## 2020-10-01 DIAGNOSIS — K5669 Other partial intestinal obstruction: Secondary | ICD-10-CM | POA: Diagnosis not present

## 2020-10-01 DIAGNOSIS — Z8249 Family history of ischemic heart disease and other diseases of the circulatory system: Secondary | ICD-10-CM | POA: Diagnosis not present

## 2020-10-01 DIAGNOSIS — E7800 Pure hypercholesterolemia, unspecified: Secondary | ICD-10-CM | POA: Diagnosis present

## 2020-10-01 DIAGNOSIS — K6389 Other specified diseases of intestine: Secondary | ICD-10-CM | POA: Diagnosis not present

## 2020-10-01 DIAGNOSIS — K589 Irritable bowel syndrome without diarrhea: Secondary | ICD-10-CM | POA: Diagnosis present

## 2020-10-01 DIAGNOSIS — Z85828 Personal history of other malignant neoplasm of skin: Secondary | ICD-10-CM | POA: Diagnosis not present

## 2020-10-01 DIAGNOSIS — D539 Nutritional anemia, unspecified: Secondary | ICD-10-CM | POA: Diagnosis present

## 2020-10-01 DIAGNOSIS — Z803 Family history of malignant neoplasm of breast: Secondary | ICD-10-CM | POA: Diagnosis not present

## 2020-10-01 LAB — CBG MONITORING, ED
Glucose-Capillary: 108 mg/dL — ABNORMAL HIGH (ref 70–99)
Glucose-Capillary: 117 mg/dL — ABNORMAL HIGH (ref 70–99)

## 2020-10-01 LAB — RESP PANEL BY RT-PCR (FLU A&B, COVID) ARPGX2
Influenza A by PCR: NEGATIVE
Influenza B by PCR: NEGATIVE
SARS Coronavirus 2 by RT PCR: NEGATIVE

## 2020-10-01 LAB — HIV ANTIBODY (ROUTINE TESTING W REFLEX): HIV Screen 4th Generation wRfx: NONREACTIVE

## 2020-10-01 MED ORDER — SODIUM CHLORIDE 0.9 % IV BOLUS
1000.0000 mL | Freq: Once | INTRAVENOUS | Status: AC
  Administered 2020-10-01: 1000 mL via INTRAVENOUS

## 2020-10-01 MED ORDER — ACETAMINOPHEN 650 MG RE SUPP: 650.0000 mg | Freq: Four times a day (QID) | RECTAL | Status: DC | PRN

## 2020-10-01 MED ORDER — LORAZEPAM 2 MG/ML IJ SOLN
0.5000 mg | Freq: Once | INTRAMUSCULAR | Status: AC
  Administered 2020-10-01: 0.5 mg via INTRAVENOUS
  Filled 2020-10-01: qty 1

## 2020-10-01 MED ORDER — PROMETHAZINE HCL 25 MG/ML IJ SOLN
12.5000 mg | Freq: Four times a day (QID) | INTRAMUSCULAR | Status: DC | PRN
  Administered 2020-10-01 – 2020-10-02 (×4): 12.5 mg via INTRAVENOUS
  Filled 2020-10-01 (×4): qty 1

## 2020-10-01 MED ORDER — DEXTROSE-NACL 5-0.9 % IV SOLN: INTRAVENOUS | Status: AC

## 2020-10-01 MED ORDER — ENOXAPARIN SODIUM 40 MG/0.4ML ~~LOC~~ SOLN
40.0000 mg | SUBCUTANEOUS | Status: DC
  Administered 2020-10-01 – 2020-10-03 (×3): 40 mg via SUBCUTANEOUS
  Filled 2020-10-01 (×3): qty 0.4

## 2020-10-01 MED ORDER — FENTANYL CITRATE (PF) 100 MCG/2ML IJ SOLN
25.0000 ug | INTRAMUSCULAR | Status: DC | PRN
  Administered 2020-10-01 – 2020-10-03 (×13): 25 ug via INTRAVENOUS
  Filled 2020-10-01 (×13): qty 2

## 2020-10-01 MED ORDER — WHITE PETROLATUM EX OINT
TOPICAL_OINTMENT | CUTANEOUS | Status: AC
  Filled 2020-10-01: qty 28.35

## 2020-10-01 MED ORDER — ONDANSETRON HCL 4 MG/2ML IJ SOLN
4.0000 mg | Freq: Four times a day (QID) | INTRAMUSCULAR | Status: DC | PRN
  Administered 2020-10-01 – 2020-10-02 (×3): 4 mg via INTRAVENOUS
  Filled 2020-10-01 (×3): qty 2

## 2020-10-01 MED ORDER — ONDANSETRON HCL 4 MG/2ML IJ SOLN
4.0000 mg | Freq: Once | INTRAMUSCULAR | Status: AC
  Administered 2020-10-01: 4 mg via INTRAVENOUS
  Filled 2020-10-01: qty 2

## 2020-10-01 MED ORDER — IOHEXOL 9 MG/ML PO SOLN
ORAL | Status: AC
  Filled 2020-10-01: qty 1000

## 2020-10-01 MED ORDER — ACETAMINOPHEN 325 MG PO TABS: 650.0000 mg | ORAL_TABLET | Freq: Four times a day (QID) | ORAL | Status: DC | PRN

## 2020-10-01 MED ORDER — IOHEXOL 9 MG/ML PO SOLN: 500.0000 mL | ORAL | Status: DC

## 2020-10-01 MED ORDER — DIATRIZOATE MEGLUMINE & SODIUM 66-10 % PO SOLN
90.0000 mL | Freq: Once | ORAL | Status: AC
  Administered 2020-10-01: 90 mL via NASOGASTRIC
  Filled 2020-10-01: qty 90

## 2020-10-01 MED ORDER — MORPHINE SULFATE (PF) 4 MG/ML IV SOLN
4.0000 mg | Freq: Once | INTRAVENOUS | Status: AC
  Administered 2020-10-01: 4 mg via INTRAVENOUS
  Filled 2020-10-01: qty 1

## 2020-10-01 MED ORDER — IOHEXOL 300 MG/ML  SOLN
100.0000 mL | Freq: Once | INTRAMUSCULAR | Status: AC | PRN
  Administered 2020-10-01: 100 mL via INTRAVENOUS

## 2020-10-01 NOTE — ED Notes (Signed)
CBG was 108.

## 2020-10-01 NOTE — H&P (Signed)
History and Physical    Bethany [name] PJK:[id number] DOB: 1956/02/06 DOA: 09/30/2020  PCP: Denita Lung, MD  Patient coming from: Home.  Chief Complaint: Abdominal pain and nausea vomiting.  HPI: Bethany [name] is a 64 y.o. female with history of macrocytic anemia anxiety dyslipidemia and gout who was admitted in 2019 for small bowel obstruction requiring surgical treatment presents to the ER with complaint of abdominal pain and nausea vomiting for the last 2 days.  Last bowel movement was more than 24 hours ago.  Abdominal pain is generalized.  ED Course: In the ER patient had CT abdomen pelvis which shows features consistent with small bowel obstruction with transition point in the infraumbilical area.  Patient was placed on NG tube.  Labs are largely unremarkable except for macrocytic picture.  Covid test is negative.  Review of Systems: As per HPI, rest all negative.   Past Medical History:  Diagnosis Date  . Anxiety   . Arthritis   . Cervical dysplasia 1990   CIN 2 subsequent cryosurgery. All Paps normal afterwards  . Dyslipidemia   . Herpes genitalis   . Herpes labialis   . Hypercholesteremia   . IBS (irritable bowel syndrome)   . Osteopenia 04/2014   T score -1.7 FRAX 7.3%/0.8% stable from prior DEXA 2013  . Raynaud disease     Past Surgical History:  Procedure Laterality Date  . AUGMENTATION MAMMAPLASTY    . BREAST ENHANCEMENT SURGERY  2006  . COSMETIC SURGERY    . EYE SURGERY     lasik od  . GYNECOLOGIC CRYOSURGERY  1990  . LAPAROTOMY N/A 04/22/2018   Procedure: EXPLORATORY LAPAROTOMY;  Surgeon: Erroll Luna, MD;  Location: [facility];  Service: General;  Laterality: N/A;  . LIGAMENT REPAIR Right 09/24/2015   Procedure: RIGHT INDEX METACARPAL PHALANGEAL RADIAL COLLATERAL LIGAMENT REPAIR;  Surgeon: Leanora Cover, MD;  Location: [facility];  Service: Orthopedics;  Laterality: Right;  . MASS EXCISION N/A 08/04/2018   Procedure: REMOVAL OF SUTURE  FROM ABDOMEN/ERAS PATHWAY;  Surgeon: Erroll Luna, MD;  Location: [facility];  Service: General;  Laterality: N/A;  . OOPHORECTOMY     BSO  . Right knee arthoscopy Right 09/09/2017   [facility]  . SHX 1 Left 02/19/2020   basel cell carcinoma  . SKIN BIOPSY Right 02/28/2019   Superficial basal carcinoma   . TONSILLECTOMY AND ADENOIDECTOMY    . TOTAL KNEE ARTHROPLASTY Right 04/12/2018  . TOTAL KNEE ARTHROPLASTY Right 04/12/2018   Procedure: RIGHT TOTAL KNEE ARTHROPLASTY;  Surgeon: Melrose Nakayama, MD;  Location: [facility];  Service: Orthopedics;  Laterality: Right;  . TUBAL LIGATION    . UPPER GASTROINTESTINAL ENDOSCOPY  04/24/05  . VAGINAL HYSTERECTOMY  2005   [facility] BSO  adenomyosis     reports that she quit smoking about 14 years ago. She has never used smokeless tobacco. She reports that she does not drink alcohol and does not use drugs.  Allergies  Allergen Reactions  . Morphine And Related Nausea Only    Family History  Problem Relation Age of Onset  . Hypertension Mother   . Cancer Father 62       Lymphoma  . Breast cancer Sister 62  . Colon cancer Neg Hx     Prior to Admission medications   Medication Sig Start Date End Date Taking? Authorizing Provider  docusate sodium (COLACE) 100 MG capsule Take 1 capsule (100 mg total) by mouth 2 (two) times daily.  04/14/18  Yes Loni Dolly, PA-C  ibuprofen (ADVIL,MOTRIN) 800 MG tablet Take 1 tablet (800 mg total) by mouth every 8 (eight) hours as needed. 09/13/18  Yes Denita Lung, MD  lamoTRIgine (LAMICTAL) 100 MG tablet Take 100 mg by mouth 2 (two) times daily.  05/19/15  Yes [provider]  allopurinol (ZYLOPRIM) 100 MG tablet Take 2 tablets (200 mg total) by mouth daily. 08/30/20   Lyndal Pulley, DO  b complex vitamins tablet Take 1 tablet by mouth daily.    [provider]  bisacodyl (DULCOLAX) 5 MG EC tablet Take 1 tablet (5 mg total) by mouth daily as needed for moderate constipation.  04/14/18   Loni Dolly, PA-C  cholecalciferol (VITAMIN D) 1000 UNITS tablet Take 1,000 Units by mouth daily.    [provider]  cycloSPORINE (RESTASIS) 0.05 % ophthalmic emulsion INSTILL 1 DROP IN BOTH EYES TWICE DAILY 11/13/19   [provider]  doxycycline (VIBRAMYCIN) 100 MG capsule Take 100 mg by mouth 2 (two) times daily.  03/05/20   [provider]  erythromycin ophthalmic ointment Place 1 application into the left eye 3 (three) times daily. Patient not taking: Reported on 09/23/2020 02/27/20   Denita Lung, MD  gabapentin (NEURONTIN) 600 MG tablet Take 1,200 mg by mouth 2 (two) times daily.     [provider]  LOTEMAX SM 0.38 % GEL Apply 1 drop to eye 4 (four) times daily.  Patient not taking: Reported on 09/23/2020 09/18/19   [provider]  oxyCODONE-acetaminophen (PERCOCET) 7.5-325 MG tablet Take 1 tablet by mouth every 4 (four) hours as needed for severe pain. Patient not taking: Reported on 09/30/2020 09/23/20   Denita Lung, MD  PROCTOZONE-HC 2.5 % rectal cream apply rectally twice a day Patient taking differently: Apply rectally twice a day as needed for irritations 06/28/17   Denita Lung, MD  simvastatin (ZOCOR) 20 MG tablet Take 1 tablet (20 mg total) by mouth daily. 10/31/19   Denita Lung, MD  traZODone (DESYREL) 50 MG tablet Take 50 mg by mouth at bedtime. 10/23/19   [provider]  vitamin C (ASCORBIC ACID) 500 MG tablet Take 500 mg by mouth daily.    [provider]  Vitamin D, Ergocalciferol, (DRISDOL) 1.25 MG (50000 UNIT) CAPS capsule TAKE 1 CAPSULE BY MOUTH EVERY 7 DAYS 09/11/20   Lyndal Pulley, DO    Physical Exam: Constitutional: Moderately built and nourished. Vitals:   09/30/20 2027 09/30/20 2323 10/01/20 0239 10/01/20 0405  BP: (!) 147/79 133/80 (!) 112/96 (!) 118/100  Pulse: 81 98 83 70  Resp: 18 18 16 19   Temp: 98.8 F (37.1 C)     TempSrc: Oral     SpO2: 97% 98% 100% 93%    Weight:      Height:       Eyes: Anicteric no pallor. ENMT: No discharge from the ears eyes nose or mouth. Neck: No mass felt.  No neck rigidity. Respiratory: No rhonchi or crepitations. Cardiovascular: S1-S2 heard. Abdomen: Soft nontender bowel sounds not appreciated. Musculoskeletal: No edema. Skin: No rash. Neurologic: Alert awake oriented to time place and person.  Moves all extremities. Psychiatric: Appears normal.  Normal affect.   Labs on Admission: I have personally reviewed following labs and imaging studies  CBC: Recent Labs  Lab 09/30/20 1335  WBC 9.1  HGB 15.3*  HCT 46.3*  MCV 101.3*  PLT 716   Basic Metabolic Panel: Recent Labs  Lab 09/30/20  1335  NA 141  K 4.2  CL 98  CO2 30  GLUCOSE 126*  BUN 10  CREATININE 0.94  CALCIUM 10.2   GFR: Estimated Creatinine Clearance: 49.8 mL/min (by C-G formula based on SCr of 0.94 mg/dL). Liver Function Tests: Recent Labs  Lab 09/30/20 1335  AST 36  ALT 24  ALKPHOS 63  BILITOT 0.6  PROT 7.9  ALBUMIN 4.9   Recent Labs  Lab 09/30/20 1335  LIPASE 25   No results for input(s): AMMONIA in the last 168 hours. Coagulation Profile: No results for input(s): INR, PROTIME in the last 168 hours. Cardiac Enzymes: No results for input(s): CKTOTAL, CKMB, CKMBINDEX, TROPONINI in the last 168 hours. BNP (last 3 results) No results for input(s): PROBNP in the last 8760 hours. HbA1C: No results for input(s): HGBA1C in the last 72 hours. CBG: No results for input(s): GLUCAP in the last 168 hours. Lipid Profile: No results for input(s): CHOL, HDL, LDLCALC, TRIG, CHOLHDL, LDLDIRECT in the last 72 hours. Thyroid Function Tests: No results for input(s): TSH, T4TOTAL, FREET4, T3FREE, THYROIDAB in the last 72 hours. Anemia Panel: No results for input(s): VITAMINB12, FOLATE, FERRITIN, TIBC, IRON, RETICCTPCT in the last 72 hours. Urine analysis:    Component Value Date/Time   COLORURINE YELLOW 09/30/2020 2000    APPEARANCEUR HAZY (A) 09/30/2020 2000   LABSPEC 1.024 09/30/2020 2000   LABSPEC 1.025 04/03/2020 1209   PHURINE 7.0 09/30/2020 2000   GLUCOSEU NEGATIVE 09/30/2020 2000   HGBUR NEGATIVE 09/30/2020 2000   BILIRUBINUR NEGATIVE 09/30/2020 2000   BILIRUBINUR small (A) 04/03/2020 1209   BILIRUBINUR n 09/16/2016 1559   KETONESUR 20 (A) 09/30/2020 2000   PROTEINUR 30 (A) 09/30/2020 2000   UROBILINOGEN 0.2 09/16/2016 1559   UROBILINOGEN 2 (H) 05/23/2015 1252   NITRITE NEGATIVE 09/30/2020 2000   LEUKOCYTESUR NEGATIVE 09/30/2020 2000   Sepsis Labs: @LABRCNTIP (procalcitonin:4,lacticidven:4) ) Recent Results (from the past 240 hour(s))  Resp Panel by RT-PCR (Flu A&B, Covid) Nasopharyngeal Swab     Status: None   Collection Time: 10/01/20  4:08 AM   Specimen: Nasopharyngeal Swab; Nasopharyngeal(NP) swabs in vial transport medium  Result Value Ref Range Status   SARS Coronavirus 2 by RT PCR NEGATIVE NEGATIVE Final    Comment: (NOTE) SARS-CoV-2 target nucleic acids are NOT DETECTED.  The SARS-CoV-2 RNA is generally detectable in upper respiratory specimens during the acute phase of infection. The lowest concentration of SARS-CoV-2 viral copies this assay can detect is 138 copies/mL. A negative result does not preclude SARS-Cov-2 infection and should not be used as the sole basis for treatment or other patient management decisions. A negative result may occur with  improper specimen collection/handling, submission of specimen other than nasopharyngeal swab, presence of viral mutation(s) within the areas targeted by this assay, and inadequate number of viral copies(<138 copies/mL). A negative result must be combined with clinical observations, patient history, and epidemiological information. The expected result is Negative.  Fact Sheet for Patients:  EntrepreneurPulse.com.au  Fact Sheet for Healthcare Providers:  IncredibleEmployment.be  This test is  no t yet approved or cleared by the [place] FDA and  has been authorized for detection and/or diagnosis of SARS-CoV-2 by FDA under an Emergency Use Authorization (EUA). This EUA will remain  in effect (meaning this test can be used) for the duration of the COVID-19 declaration under Section 564(b)(1) of the Act, 21 U.S.C.section 360bbb-3(b)(1), unless the authorization is terminated  or revoked sooner.       Influenza A by  PCR NEGATIVE NEGATIVE Final   Influenza B by PCR NEGATIVE NEGATIVE Final    Comment: (NOTE) The Xpert Xpress SARS-CoV-2/FLU/RSV plus assay is intended as an aid in the diagnosis of influenza from Nasopharyngeal swab specimens and should not be used as a sole basis for treatment. Nasal washings and aspirates are unacceptable for Xpert Xpress SARS-CoV-2/FLU/RSV testing.  Fact Sheet for Patients: EntrepreneurPulse.com.au  Fact Sheet for Healthcare Providers: IncredibleEmployment.be  This test is not yet approved or cleared by the [place] FDA and has been authorized for detection and/or diagnosis of SARS-CoV-2 by FDA under an Emergency Use Authorization (EUA). This EUA will remain in effect (meaning this test can be used) for the duration of the COVID-19 declaration under Section 564(b)(1) of the Act, 21 U.S.C. section 360bbb-3(b)(1), unless the authorization is terminated or revoked.  Performed at [facility], [place] [address], [place], [place] [address]      Radiological Exams on Admission: CT ABDOMEN PELVIS W CONTRAST  Result Date: 10/01/2020 CLINICAL DATA:  Acute nonlocalized abdominal pain EXAM: CT ABDOMEN AND PELVIS WITH CONTRAST TECHNIQUE: Multidetector CT imaging of the abdomen and pelvis was performed using the standard protocol following bolus administration of intravenous contrast. CONTRAST:  135mL OMNIPAQUE IOHEXOL 300 MG/ML  SOLN COMPARISON:  04/17/2018 FINDINGS: Lower chest: The visualized lung  bases are clear bilaterally. Bilateral breast implants are partially visualized. The visualized heart and pericardium are unremarkable. Hepatobiliary: No focal liver abnormality is seen. No gallstones, gallbladder wall thickening, or biliary dilatation. Pancreas: Unremarkable Spleen: Unremarkable Adrenals/Urinary Tract: Adrenal glands are unremarkable. Kidneys are normal, without renal calculi, focal lesion, or hydronephrosis. Bladder is unremarkable. Stomach/Bowel: Partial small bowel resection has been performed with an anastomotic staple line seen within the right lower quadrant. A small bowel obstruction is present with a single point of transition identified within the infraumbilical abdomen, best seen on axial image # 45 and coronal image # 19. The proximal small bowel is dilated and fluid-filled measuring up to 4.7 cm in diameter just proximal to the point of obstruction. Distally, the small bowel is decompressed. The stomach and large bowel are unremarkable. The appendix is not clearly identified, however, there is no pericecal inflammatory change identified to suggest acute appendicitis. No free intraperitoneal gas or fluid. Vascular/Lymphatic: Retroaortic left renal vein. The abdominal vasculature is otherwise unremarkable. No pathologic adenopathy within the abdomen and pelvis. Reproductive: Status post hysterectomy. No adnexal masses. Other: Rectum unremarkable Musculoskeletal: No acute bone abnormality. Degenerative changes are noted within the lumbar spine, most severe at L4-5 with associated grade 2 anterolisthesis. IMPRESSION: Distal small bowel obstruction with point of transition seen within the infraumbilical abdomen Electronically Signed   By: Fidela Salisbury MD   On: 10/01/2020 03:57   DG Abd Portable 1 View  Result Date: 10/01/2020 CLINICAL DATA:  NG tube placement. EXAM: PORTABLE ABDOMEN - 1 VIEW COMPARISON:  CT 10/01/2020. FINDINGS: NG tube noted with tip over the stomach. Persistent  small-bowel distention. Stool noted in colon. No free air identified. IV contrast from prior CT in the renal collecting system and bladder. No acute bony abnormality identified. IMPRESSION: NG tube noted with tip over the stomach. Persistent small-bowel distention. Electronically Signed   By: Marcello Moores  Register   On: 10/01/2020 05:33     Assessment/Plan Principal Problem:   SBO (small bowel obstruction) ([place])    1. Small bowel obstruction -presently placed patient on NG tube suction follow KUB consult general surgery.  Has had surgical treatment for small bowel obstruction  in 2019.  Will follow surgery recommendations. 2. History of anxiety on Lamictal and gabapentin presently n.p.o. 3. History of hyperlipidemia on statins presently n.p.o. 4. Macrocytosis appears to be chronic follow CBC. 5. History of gout on allopurinol presently n.p.o.  Since patient has small bowel obstruction will need close monitoring and further management inpatient status.   DVT prophylaxis: SCDs.  Avoiding anticoagulation in anticipation of possible surgical procedure. Code Status: Full code. Family Communication: Discussed with patient. Disposition Plan: Home. Consults called: We will consult general surgery. Admission status: Inpatient.   Rise Patience MD [facility] Pager [phone].  If 7PM-7AM, please contact night-coverage [email or web address] Password [email or web address]  10/01/2020, 6:06 AM

## 2020-10-01 NOTE — ED Notes (Signed)
Gastrografin requested from pharmacy at this time

## 2020-10-01 NOTE — Progress Notes (Signed)
Patient ID: Bethany [name], female   DOB: 01-May-1956, 64 y.o.   MRN: [id number] Unchanged, not much out ng, may need to change this for larger tube, has film pending today and will write for one tomorrow, remain npo

## 2020-10-01 NOTE — Progress Notes (Signed)
Patient admitted after midnight, please see H&P by Dr. Hal Hope.  Appreciate GS consult.  Patient here with SBO.  H/o same requiring surgical intervention in 2019.  NG tube in place.  AM labs ordered.  Mg pending.  K >4.  Goal is to avoid surgical intervention.  SBO protocol started. Eulogio Bear DO

## 2020-10-01 NOTE — ED Notes (Signed)
Spoke to [employer] in x-ray to let him know what time Gastrografin was administered.

## 2020-10-01 NOTE — ED Notes (Signed)
Permission given to use NG tube at this time,

## 2020-10-01 NOTE — Consult Note (Signed)
Bethany [name] 03-28-1956  [id number].    Requesting MD: Dr. Gean Birchwood  Chief Complaint/Reason for Consult: SBO  HPI:  This is a 64 yo white female with a history of hyperlipidemia who has a history of SBO in 2019 that required an ex lap with SBR by Dr. Brantley Stage.  She has done well since that time.  She has had a tubal ligation as well as a hysterectomy in the past.  She awoke yesterday morning at 0400am with a burning pain in her abdomen.  She developed some nausea, but no emesis until she arrived her overnight in the ED.  Her pain was intermittent.  She denies fevers, chills, chest pain, SOB, dysuria, etc.  She had a BM yesterday morning and has passed a small amount of flatus.  She presented to the ED last night for evaluation secondary to persistent pain.  Her WBC is normal and she is AF.  She has a CT scan that reveals a small bowel obstruction with a transition point in her pelvis.  We have been asked to see her for further evaluation and management.  ROS: ROS: Please see HPI, otherwise all other systems have been reviewed and are negative.  Family History  Problem Relation Age of Onset  . Hypertension Mother   . Cancer Father 86       Lymphoma  . Breast cancer Sister 40  . Colon cancer Neg Hx     Past Medical History:  Diagnosis Date  . Anxiety   . Arthritis   . Cervical dysplasia 1990   CIN 2 subsequent cryosurgery. All Paps normal afterwards  . Dyslipidemia   . Herpes genitalis   . Herpes labialis   . Hypercholesteremia   . IBS (irritable bowel syndrome)   . Osteopenia 04/2014   T score -1.7 FRAX 7.3%/0.8% stable from prior DEXA 2013  . Raynaud disease     Past Surgical History:  Procedure Laterality Date  . AUGMENTATION MAMMAPLASTY    . BREAST ENHANCEMENT SURGERY  2006  . COSMETIC SURGERY    . EYE SURGERY     lasik od  . GYNECOLOGIC CRYOSURGERY  1990  . LAPAROTOMY N/A 04/22/2018   Procedure: EXPLORATORY LAPAROTOMY;  Surgeon: Erroll Luna, MD;   Location: [facility];  Service: General;  Laterality: N/A;  . LIGAMENT REPAIR Right 09/24/2015   Procedure: RIGHT INDEX METACARPAL PHALANGEAL RADIAL COLLATERAL LIGAMENT REPAIR;  Surgeon: Leanora Cover, MD;  Location: [facility];  Service: Orthopedics;  Laterality: Right;  . MASS EXCISION N/A 08/04/2018   Procedure: REMOVAL OF SUTURE FROM ABDOMEN/ERAS PATHWAY;  Surgeon: Erroll Luna, MD;  Location: [facility];  Service: General;  Laterality: N/A;  . OOPHORECTOMY     BSO  . Right knee arthoscopy Right 09/09/2017   [facility]  . SHX 1 Left 02/19/2020   basel cell carcinoma  . SKIN BIOPSY Right 02/28/2019   Superficial basal carcinoma   . TONSILLECTOMY AND ADENOIDECTOMY    . TOTAL KNEE ARTHROPLASTY Right 04/12/2018  . TOTAL KNEE ARTHROPLASTY Right 04/12/2018   Procedure: RIGHT TOTAL KNEE ARTHROPLASTY;  Surgeon: Melrose Nakayama, MD;  Location: [facility];  Service: Orthopedics;  Laterality: Right;  . TUBAL LIGATION    . UPPER GASTROINTESTINAL ENDOSCOPY  04/24/05  . VAGINAL HYSTERECTOMY  2005   [facility] BSO  adenomyosis    Social History:  reports that she quit smoking about 14 years ago. She has never used smokeless tobacco. She reports that she does  not drink alcohol and does not use drugs.  Allergies:  Allergies  Allergen Reactions  . Morphine And Related Nausea Only    (Not in a hospital admission)    Physical Exam: Blood pressure (!) 146/78, pulse 77, temperature 98.8 F (37.1 C), temperature source Oral, resp. rate 14, height 5\' 5"  (1.651 m), weight 52.2 kg, SpO2 91 %. General: pleasant, WD, WN white female who is laying in bed in NAD HEENT: head is normocephalic, atraumatic.  Sclera are noninjected.  PERRL.  Ears and nose without any masses or lesions.  NGT in right nare.  Mouth is pink and dry Heart: regular, rate, and rhythm.  Normal s1,s2. No obvious murmurs, gallops, or rubs noted.  Palpable radial and pedal pulses bilaterally Lungs: CTAB, no wheezes,  rhonchi, or rales noted.  Respiratory effort nonlabored Abd: soft, mildly tender to palpation greater in lower abdomen, mild bloating in lower abdomen, few BS, no masses, hernias, or organomegaly MS: all 4 extremities are symmetrical with no cyanosis, clubbing, or edema. Skin: warm and dry with no masses, lesions, or rashes Neuro: Cranial nerves 2-12 grossly intact, sensation is normal throughout Psych: A&Ox3 with an appropriate affect.   Results for orders placed or performed during the hospital encounter of 09/30/20 (from the past 48 hour(s))  Lipase, blood     Status: None   Collection Time: 09/30/20  1:35 PM  Result Value Ref Range   Lipase 25 11 - 51 U/L    Comment: Performed at [facility], [place] [address] [place], [place] [address]  Comprehensive metabolic panel     Status: Abnormal   Collection Time: 09/30/20  1:35 PM  Result Value Ref Range   Sodium 141 135 - 145 mmol/L   Potassium 4.2 3.5 - 5.1 mmol/L   Chloride 98 98 - 111 mmol/L   CO2 30 22 - 32 mmol/L   Glucose, Bld 126 (H) 70 - 99 mg/dL    Comment: Glucose reference range applies only to samples taken after fasting for at least 8 hours.   BUN 10 8 - 23 mg/dL   Creatinine, Ser 0.94 0.44 - 1.00 mg/dL   Calcium 10.2 8.9 - 10.3 mg/dL   Total Protein 7.9 6.5 - 8.1 g/dL   Albumin 4.9 3.5 - 5.0 g/dL   AST 36 15 - 41 U/L   ALT 24 0 - 44 U/L   Alkaline Phosphatase 63 38 - 126 U/L   Total Bilirubin 0.6 0.3 - 1.2 mg/dL   GFR, Estimated >60 >60 mL/min    Comment: (NOTE) Calculated using the CKD-EPI Creatinine Equation (2021)    Anion gap 13 5 - 15    Comment: Performed at [facility] [address] [place], [place] [address]  CBC     Status: Abnormal   Collection Time: 09/30/20  1:35 PM  Result Value Ref Range   WBC 9.1 4.0 - 10.5 K/uL   RBC 4.57 3.87 - 5.11 MIL/uL   Hemoglobin 15.3 (H) 12.0 - 15.0 g/dL   HCT 46.3 (H) 36 - 46 %   MCV 101.3 (H) 80.0 - 100.0 fL   MCH 33.5 26.0 - 34.0 pg   MCHC 33.0 30.0  - 36.0 g/dL   RDW 11.8 11.5 - 15.5 %   Platelets 212 150 - 400 K/uL   nRBC 0.0 0.0 - 0.2 %    Comment: Performed at [facility], [place] [address] [place], [place] [address]  Urinalysis, Routine w reflex  microscopic Urine, Clean Catch     Status: Abnormal   Collection Time: 09/30/20  8:00 PM  Result Value Ref Range   Color, Urine YELLOW YELLOW   APPearance HAZY (A) CLEAR   Specific Gravity, Urine 1.024 1.005 - 1.030   pH 7.0 5.0 - 8.0   Glucose, UA NEGATIVE NEGATIVE mg/dL   Hgb urine dipstick NEGATIVE NEGATIVE   Bilirubin Urine NEGATIVE NEGATIVE   Ketones, ur 20 (A) NEGATIVE mg/dL   Protein, ur 30 (A) NEGATIVE mg/dL   Nitrite NEGATIVE NEGATIVE   Leukocytes,Ua NEGATIVE NEGATIVE   RBC / HPF 0-5 0 - 5 RBC/hpf   WBC, UA 0-5 0 - 5 WBC/hpf   Bacteria, UA RARE (A) NONE SEEN   Mucus PRESENT     Comment: Performed at [facility] [address], [place], [place] [address]  Resp Panel by RT-PCR (Flu A&B, Covid) Nasopharyngeal Swab     Status: None   Collection Time: 10/01/20  4:08 AM   Specimen: Nasopharyngeal Swab; Nasopharyngeal(NP) swabs in vial transport medium  Result Value Ref Range   SARS Coronavirus 2 by RT PCR NEGATIVE NEGATIVE    Comment: (NOTE) SARS-CoV-2 target nucleic acids are NOT DETECTED.  The SARS-CoV-2 RNA is generally detectable in upper respiratory specimens during the acute phase of infection. The lowest concentration of SARS-CoV-2 viral copies this assay can detect is 138 copies/mL. A negative result does not preclude SARS-Cov-2 infection and should not be used as the sole basis for treatment or other patient management decisions. A negative result may occur with  improper specimen collection/handling, submission of specimen other than nasopharyngeal swab, presence of viral mutation(s) within the areas targeted by this assay, and inadequate number of viral copies(<138 copies/mL). A negative result must be combined with clinical observations, patient  history, and epidemiological information. The expected result is Negative.  Fact Sheet for Patients:  EntrepreneurPulse.com.au  Fact Sheet for Healthcare Providers:  IncredibleEmployment.be  This test is no t yet approved or cleared by the [place] FDA and  has been authorized for detection and/or diagnosis of SARS-CoV-2 by FDA under an Emergency Use Authorization (EUA). This EUA will remain  in effect (meaning this test can be used) for the duration of the COVID-19 declaration under Section 564(b)(1) of the Act, 21 U.S.C.section 360bbb-3(b)(1), unless the authorization is terminated  or revoked sooner.       Influenza A by PCR NEGATIVE NEGATIVE   Influenza B by PCR NEGATIVE NEGATIVE    Comment: (NOTE) The Xpert Xpress SARS-CoV-2/FLU/RSV plus assay is intended as an aid in the diagnosis of influenza from Nasopharyngeal swab specimens and should not be used as a sole basis for treatment. Nasal washings and aspirates are unacceptable for Xpert Xpress SARS-CoV-2/FLU/RSV testing.  Fact Sheet for Patients: EntrepreneurPulse.com.au  Fact Sheet for Healthcare Providers: IncredibleEmployment.be  This test is not yet approved or cleared by the [place] FDA and has been authorized for detection and/or diagnosis of SARS-CoV-2 by FDA under an Emergency Use Authorization (EUA). This EUA will remain in effect (meaning this test can be used) for the duration of the COVID-19 declaration under Section 564(b)(1) of the Act, 21 U.S.C. section 360bbb-3(b)(1), unless the authorization is terminated or revoked.  Performed at [facility], [place] [address] [place], [place] [address]   CBG monitoring, ED     Status: Abnormal   Collection Time: 10/01/20  7:37 AM  Result Value Ref Range   Glucose-Capillary 108 (H) 70 - 99 mg/dL  Comment: Glucose reference range applies only to samples taken after fasting for  at least 8 hours.   Comment 1 Notify RN    Comment 2 Document in Chart    CT ABDOMEN PELVIS W CONTRAST  Result Date: 10/01/2020 CLINICAL DATA:  Acute nonlocalized abdominal pain EXAM: CT ABDOMEN AND PELVIS WITH CONTRAST TECHNIQUE: Multidetector CT imaging of the abdomen and pelvis was performed using the standard protocol following bolus administration of intravenous contrast. CONTRAST:  124mL OMNIPAQUE IOHEXOL 300 MG/ML  SOLN COMPARISON:  04/17/2018 FINDINGS: Lower chest: The visualized lung bases are clear bilaterally. Bilateral breast implants are partially visualized. The visualized heart and pericardium are unremarkable. Hepatobiliary: No focal liver abnormality is seen. No gallstones, gallbladder wall thickening, or biliary dilatation. Pancreas: Unremarkable Spleen: Unremarkable Adrenals/Urinary Tract: Adrenal glands are unremarkable. Kidneys are normal, without renal calculi, focal lesion, or hydronephrosis. Bladder is unremarkable. Stomach/Bowel: Partial small bowel resection has been performed with an anastomotic staple line seen within the right lower quadrant. A small bowel obstruction is present with a single point of transition identified within the infraumbilical abdomen, best seen on axial image # 45 and coronal image # 19. The proximal small bowel is dilated and fluid-filled measuring up to 4.7 cm in diameter just proximal to the point of obstruction. Distally, the small bowel is decompressed. The stomach and large bowel are unremarkable. The appendix is not clearly identified, however, there is no pericecal inflammatory change identified to suggest acute appendicitis. No free intraperitoneal gas or fluid. Vascular/Lymphatic: Retroaortic left renal vein. The abdominal vasculature is otherwise unremarkable. No pathologic adenopathy within the abdomen and pelvis. Reproductive: Status post hysterectomy. No adnexal masses. Other: Rectum unremarkable Musculoskeletal: No acute bone abnormality.  Degenerative changes are noted within the lumbar spine, most severe at L4-5 with associated grade 2 anterolisthesis. IMPRESSION: Distal small bowel obstruction with point of transition seen within the infraumbilical abdomen Electronically Signed   By: Fidela Salisbury MD   On: 10/01/2020 03:57   DG Abd Portable 1 View  Result Date: 10/01/2020 CLINICAL DATA:  NG tube placement. EXAM: PORTABLE ABDOMEN - 1 VIEW COMPARISON:  CT 10/01/2020. FINDINGS: NG tube noted with tip over the stomach. Persistent small-bowel distention. Stool noted in colon. No free air identified. IV contrast from prior CT in the renal collecting system and bladder. No acute bony abnormality identified. IMPRESSION: NG tube noted with tip over the stomach. Persistent small-bowel distention. Electronically Signed   By: Marcello Moores  Register   On: 10/01/2020 05:33      Assessment/Plan Hyperlipidemia  SBO The patient has a history of abdominal surgery as well as a previous small bowel obstruction that required a laparotomy with SBR in 2019.  She appears to have a recurrent SBO with a transition point in the pelvis.  She has an NGT in place, albeit, a very tiny one.  Settings have been corrected and tube flushed and is now correctly working again.  We will leave this small tube as long as it continues to work.  If it continues to become clogged or has issues, this may need to be upsized.  Otherwise, we will start the SBO protocol and try to avoid surgical intervention.  We discussed the protocol and how this works.  We discussed if she fails conservative management that she will require a repeat laparotomy.  She understands all of this and is agreeable to this plan.  We will continue to follow the patient.   FEN - NPO/NGT/IVFs VTE - ok for chemical prophylaxis  ID - none needed   Henreitta Cea, [facility] 10/01/2020, 7:58 AM Please see Amion for pager number during day hours 7:00am-4:30pm or 7:00am -11:30am on  weekends

## 2020-10-01 NOTE — ED Provider Notes (Signed)
[facility] DEPARTMENT Provider Note   CSN: [id number] Arrival date & time: 09/30/20  1237     History Chief Complaint  Patient presents with  . Abdominal Pain    Bethany [name] is a 64 y.o. female.  HPI     This is a 64 year old female with a history of hypercholesterolemia, IBS, SBO who presents with abdominal pain.  Patient reports sharp crampy abdominal pain since yesterday morning around 4 AM.  It comes and goes.  Currently her pain is 8 out of 10.  She did have some improvement with Percocet in the waiting room.  She has had nausea without vomiting.  She reports last normal bowel movement was yesterday morning.  Denies any urinary symptoms, flank pain, hematuria, dysuria.  Denies any fevers.  No known sick contacts or Covid exposures.  Past Medical History:  Diagnosis Date  . Anxiety   . Arthritis   . Cervical dysplasia 1990   CIN 2 subsequent cryosurgery. All Paps normal afterwards  . Dyslipidemia   . Herpes genitalis   . Herpes labialis   . Hypercholesteremia   . IBS (irritable bowel syndrome)   . Osteopenia 04/2014   T score -1.7 FRAX 7.3%/0.8% stable from prior DEXA 2013  . Raynaud disease     Patient Active Problem List   Diagnosis Date Noted  . Weight disorder 08/30/2020  . Degenerative disc disease, lumbar 05/02/2020  . H/O calcium pyrophosphate deposition disease (CPPD) 03/26/2020  . Arthritis of carpometacarpal [facility]) joint of left thumb 01/18/2020  . Injury of collateral ligament of finger of right hand 10/31/2019  . Status post total right knee replacement 10/31/2019  . Tear of left biceps muscle 10/20/2019  . Attention deficit disorder 09/13/2018  . IBS (irritable bowel syndrome) 04/17/2018  . History of small bowel obstruction 04/17/2018  . Nonallopathic lesion of thoracic region 08/05/2017  . Nonallopathic lesion of lumbosacral region 08/05/2017  . Nonallopathic lesion of sacral region 08/05/2017  . Tubular adenoma  of colon 04/21/2016  . Mood disorder ([place])   . Hyperlipidemia with target LDL less than 100 06/17/2011  . Raynaud's disease 06/17/2011  . Recurrent herpes labialis 06/17/2011    Past Surgical History:  Procedure Laterality Date  . AUGMENTATION MAMMAPLASTY    . BREAST ENHANCEMENT SURGERY  2006  . COSMETIC SURGERY    . EYE SURGERY     lasik od  . GYNECOLOGIC CRYOSURGERY  1990  . LAPAROTOMY N/A 04/22/2018   Procedure: EXPLORATORY LAPAROTOMY;  Surgeon: Erroll Luna, MD;  Location: [facility];  Service: General;  Laterality: N/A;  . LIGAMENT REPAIR Right 09/24/2015   Procedure: RIGHT INDEX METACARPAL PHALANGEAL RADIAL COLLATERAL LIGAMENT REPAIR;  Surgeon: Leanora Cover, MD;  Location: [facility];  Service: Orthopedics;  Laterality: Right;  . MASS EXCISION N/A 08/04/2018   Procedure: REMOVAL OF SUTURE FROM ABDOMEN/ERAS PATHWAY;  Surgeon: Erroll Luna, MD;  Location: [facility];  Service: General;  Laterality: N/A;  . OOPHORECTOMY     BSO  . Right knee arthoscopy Right 09/09/2017   [facility]  . SHX 1 Left 02/19/2020   basel cell carcinoma  . SKIN BIOPSY Right 02/28/2019   Superficial basal carcinoma   . TONSILLECTOMY AND ADENOIDECTOMY    . TOTAL KNEE ARTHROPLASTY Right 04/12/2018  . TOTAL KNEE ARTHROPLASTY Right 04/12/2018   Procedure: RIGHT TOTAL KNEE ARTHROPLASTY;  Surgeon: Melrose Nakayama, MD;  Location: [address];  Service: Orthopedics;  Laterality: Right;  . TUBAL LIGATION    .  UPPER GASTROINTESTINAL ENDOSCOPY  04/24/05  . VAGINAL HYSTERECTOMY  2005   TVH BSO  adenomyosis     OB History    Gravida  5   Para  2   Term  2   Preterm      AB  3   Living  2     SAB  1   TAB      Ectopic      Multiple      Live Births              Family History  Problem Relation Age of Onset  . Hypertension Mother   . Cancer Father 69       Lymphoma  . Breast cancer Sister 55  . Colon cancer Neg Hx     Social History   Tobacco Use  .  Smoking status: Former Smoker    Quit date: 03/21/2006    Years since quitting: 14.5  . Smokeless tobacco: Never Used  Vaping Use  . Vaping Use: Never used  Substance Use Topics  . Alcohol use: No    Alcohol/week: 0.0 standard drinks    Comment: drinking in 1993  . Drug use: No    Home Medications Prior to Admission medications   Medication Sig Start Date End Date Taking? Authorizing Provider  allopurinol (ZYLOPRIM) 100 MG tablet Take 2 tablets (200 mg total) by mouth daily. 08/30/20   Lyndal Pulley, DO  b complex vitamins tablet Take 1 tablet by mouth daily.    [provider]  bisacodyl (DULCOLAX) 5 MG EC tablet Take 1 tablet (5 mg total) by mouth daily as needed for moderate constipation. 04/14/18   Loni Dolly, PA-C  cholecalciferol (VITAMIN D) 1000 UNITS tablet Take 1,000 Units by mouth daily.    [provider]  cycloSPORINE (RESTASIS) 0.05 % ophthalmic emulsion INSTILL 1 DROP IN BOTH EYES TWICE DAILY 11/13/19   [provider]  docusate sodium (COLACE) 100 MG capsule Take 1 capsule (100 mg total) by mouth 2 (two) times daily. 04/14/18   Loni Dolly, PA-C  doxycycline (VIBRAMYCIN) 100 MG capsule Take 100 mg by mouth 2 (two) times daily.  03/05/20   [provider]  erythromycin ophthalmic ointment Place 1 application into the left eye 3 (three) times daily. Patient not taking: Reported on 09/23/2020 02/27/20   Denita Lung, MD  gabapentin (NEURONTIN) 600 MG tablet Take 1,200 mg by mouth 2 (two) times daily.     [provider]  ibuprofen (ADVIL,MOTRIN) 800 MG tablet Take 1 tablet (800 mg total) by mouth every 8 (eight) hours as needed. 09/13/18   Denita Lung, MD  lamoTRIgine (LAMICTAL) 100 MG tablet Take 100 mg by mouth 2 (two) times daily.  05/19/15   [provider]  LOTEMAX SM 0.38 % GEL Apply 1 drop to eye 4 (four) times daily.  Patient not taking: Reported on 09/23/2020 09/18/19   [provider]  meloxicam  (MOBIC) 15 MG tablet Take 1 tablet (15 mg total) by mouth daily. Patient not taking: Reported on 09/30/2020 10/20/19   Lyndal Pulley, DO  montelukast (SINGULAIR) 10 MG tablet Take 1 tablet (10 mg total) by mouth at bedtime. Patient not taking: Reported on 09/23/2020 07/28/18   Lyndal Pulley, DO  Nutritional Supplements (VITAMIN D BOOSTER PO) Take 2,000 Units by mouth.    [provider]  oxyCODONE-acetaminophen (PERCOCET) 7.5-325 MG tablet Take 1 tablet by mouth every 4 (four) hours as needed  for severe pain. Patient not taking: Reported on 09/30/2020 09/23/20   Denita Lung, MD  PROCTOZONE-HC 2.5 % rectal cream apply rectally twice a day Patient taking differently: Apply rectally twice a day as needed for irritations 06/28/17   Denita Lung, MD  simvastatin (ZOCOR) 20 MG tablet Take 1 tablet (20 mg total) by mouth daily. 10/31/19   Denita Lung, MD  sulfamethoxazole-trimethoprim (BACTRIM DS) 800-160 MG tablet Take 1 tablet by mouth 2 (two) times daily. Patient not taking: Reported on 09/23/2020 03/15/20   Denita Lung, MD  terbinafine (LAMISIL) 250 MG tablet Take 1 tablet (250 mg total) by mouth daily. Patient not taking: Reported on 09/23/2020 12/07/19   Denita Lung, MD  traZODone (DESYREL) 50 MG tablet Take 50 mg by mouth at bedtime. 10/23/19   [provider]  valACYclovir (VALTREX) 1000 MG tablet Take 1 tablet (1,000 mg total) by mouth 2 (two) times daily. 09/19/18   Denita Lung, MD  vitamin C (ASCORBIC ACID) 500 MG tablet Take 500 mg by mouth daily.    [provider]  Vitamin D, Ergocalciferol, (DRISDOL) 1.25 MG (50000 UNIT) CAPS capsule TAKE 1 CAPSULE BY MOUTH EVERY 7 DAYS 09/11/20   Lyndal Pulley, DO    Allergies    Morphine and related  Review of Systems   Review of Systems  Constitutional: Negative for fever.  Respiratory: Negative for shortness of breath.   Cardiovascular: Negative for chest pain.  Gastrointestinal: Positive  for abdominal pain and nausea. Negative for constipation, diarrhea and vomiting.  Genitourinary: Negative for dysuria.  All other systems reviewed and are negative.   Physical Exam Updated Vital Signs BP (!) 118/100 (BP Location: Right Arm)   Pulse 70   Temp 98.8 F (37.1 C) (Oral)   Resp 19   Ht 1.651 m (5\' 5" )   Wt 52.2 kg   SpO2 93%   BMI 19.14 kg/m   Physical Exam Vitals and nursing note reviewed.  Constitutional:      Appearance: She is well-developed. She is not ill-appearing.  HENT:     Head: Normocephalic and atraumatic.  Eyes:     Pupils: Pupils are equal, round, and reactive to light.  Cardiovascular:     Rate and Rhythm: Normal rate and regular rhythm.     Heart sounds: Normal heart sounds.  Pulmonary:     Effort: Pulmonary effort is normal. No respiratory distress.     Breath sounds: No wheezing.  Abdominal:     General: Bowel sounds are normal.     Palpations: Abdomen is soft.     Tenderness: There is generalized abdominal tenderness. There is no guarding or rebound.  Musculoskeletal:     Cervical back: Neck supple.  Skin:    General: Skin is warm and dry.  Neurological:     Mental Status: She is alert and oriented to person, place, and time.  Psychiatric:     Comments: Anxious appearing     ED Results / Procedures / Treatments   Labs (all labs ordered are listed, but only abnormal results are displayed) Labs Reviewed  COMPREHENSIVE METABOLIC PANEL - Abnormal; Notable for the following components:      Result Value   Glucose, Bld 126 (*)    All other components within normal limits  CBC - Abnormal; Notable for the following components:   Hemoglobin 15.3 (*)    HCT 46.3 (*)    MCV 101.3 (*)    All other components  within normal limits  URINALYSIS, ROUTINE W REFLEX MICROSCOPIC - Abnormal; Notable for the following components:   APPearance HAZY (*)    Ketones, ur 20 (*)    Protein, ur 30 (*)    Bacteria, UA RARE (*)    All other components  within normal limits  RESP PANEL BY RT-PCR (FLU A&B, COVID) ARPGX2  LIPASE, BLOOD    EKG EKG Interpretation  Date/Time:  Monday September 30 2020 13:24:34 EST Ventricular Rate:  71 PR Interval:  134 QRS Duration: 74 QT Interval:  408 QTC Calculation: 443 R Axis:   82 Text Interpretation: Normal sinus rhythm Normal ECG Confirmed by Thayer Jew [phone]) on 10/01/2020 2:35:45 AM   Radiology CT ABDOMEN PELVIS W CONTRAST  Result Date: 10/01/2020 CLINICAL DATA:  Acute nonlocalized abdominal pain EXAM: CT ABDOMEN AND PELVIS WITH CONTRAST TECHNIQUE: Multidetector CT imaging of the abdomen and pelvis was performed using the standard protocol following bolus administration of intravenous contrast. CONTRAST:  151mL OMNIPAQUE IOHEXOL 300 MG/ML  SOLN COMPARISON:  04/17/2018 FINDINGS: Lower chest: The visualized lung bases are clear bilaterally. Bilateral breast implants are partially visualized. The visualized heart and pericardium are unremarkable. Hepatobiliary: No focal liver abnormality is seen. No gallstones, gallbladder wall thickening, or biliary dilatation. Pancreas: Unremarkable Spleen: Unremarkable Adrenals/Urinary Tract: Adrenal glands are unremarkable. Kidneys are normal, without renal calculi, focal lesion, or hydronephrosis. Bladder is unremarkable. Stomach/Bowel: Partial small bowel resection has been performed with an anastomotic staple line seen within the right lower quadrant. A small bowel obstruction is present with a single point of transition identified within the infraumbilical abdomen, best seen on axial image # 45 and coronal image # 19. The proximal small bowel is dilated and fluid-filled measuring up to 4.7 cm in diameter just proximal to the point of obstruction. Distally, the small bowel is decompressed. The stomach and large bowel are unremarkable. The appendix is not clearly identified, however, there is no pericecal inflammatory change identified to suggest acute  appendicitis. No free intraperitoneal gas or fluid. Vascular/Lymphatic: Retroaortic left renal vein. The abdominal vasculature is otherwise unremarkable. No pathologic adenopathy within the abdomen and pelvis. Reproductive: Status post hysterectomy. No adnexal masses. Other: Rectum unremarkable Musculoskeletal: No acute bone abnormality. Degenerative changes are noted within the lumbar spine, most severe at L4-5 with associated grade 2 anterolisthesis. IMPRESSION: Distal small bowel obstruction with point of transition seen within the infraumbilical abdomen Electronically Signed   By: Fidela Salisbury MD   On: 10/01/2020 03:57    Procedures Procedures (including critical care time)  Medications Ordered in ED Medications  iohexol (OMNIPAQUE) 9 MG/ML oral solution 500 mL (0 mLs Oral Hold 10/01/20 0319)  LORazepam (ATIVAN) injection 0.5 mg (has no administration in time range)  ondansetron (ZOFRAN-ODT) disintegrating tablet 4 mg (4 mg Oral Given 09/30/20 1328)  oxyCODONE-acetaminophen (PERCOCET/ROXICET) 5-325 MG per tablet 1 tablet (1 tablet Oral Given 09/30/20 1328)  morphine 4 MG/ML injection 4 mg (4 mg Intravenous Given 10/01/20 0308)  ondansetron (ZOFRAN) injection 4 mg (4 mg Intravenous Given 10/01/20 0308)  sodium chloride 0.9 % bolus 1,000 mL (1,000 mLs Intravenous New Bag/Given 10/01/20 0311)  iohexol (OMNIPAQUE) 300 MG/ML solution 100 mL (100 mLs Intravenous Contrast Given 10/01/20 0348)    ED Course  I have reviewed the triage vital signs and the nursing notes.  Pertinent labs & imaging results that were available during my care of the patient were reviewed by me and considered in my medical decision making (see chart for details).  MDM Rules/Calculators/A&P                          Patient presents with abdominal pain and nausea.  History of SBO.  She is overall nontoxic-appearing.  Unfortunately she is waited in the waiting room for greater than 12 hours.  She is very anxious and  in pain.  She was given pain and nausea medication.  Vital signs reviewed and reassuring.  She does have diffuse tenderness on exam without signs of peritonitis.  Lab work reviewed without significant metabolic derangements or leukocytosis.  She does have 20 ketones in the urine likely suggestive of mild dehydration.  Patient was given fluids.  CT scan obtained and shows recurrent small bowel obstruction.  Her prior bowel obstruction failed conservative management requiring ex lap and lysis of adhesions.  She has had vomiting when attempting to drink IV contrast.  For this reason will place an NG tube.  Do not feel she needs emergent surgical evaluation at this time as she is not peritonitic so we will plan for admission to the hospitalist.  Surgery consultation seems appropriate for the day team.  Covid testing pending.  Final Clinical Impression(s) / ED Diagnoses Final diagnoses:  SBO (small bowel obstruction) ([place])    Rx / DC Orders ED Discharge Orders    None       Donise Woodle, Barbette Hair, MD 10/01/20 [phone]

## 2020-10-01 NOTE — ED Notes (Signed)
X-ray at bedside

## 2020-10-01 NOTE — Plan of Care (Signed)
  Problem: Clinical Measurements: Goal: Diagnostic test results will improve Outcome: Progressing Goal: Respiratory complications will improve Outcome: Progressing Goal: Cardiovascular complication will be avoided Outcome: Progressing   Problem: Activity: Goal: Risk for activity intolerance will decrease Outcome: Progressing   Problem: Coping: Goal: Level of anxiety will decrease Outcome: Progressing   Problem: Elimination: Goal: Will not experience complications related to urinary retention Outcome: Progressing

## 2020-10-01 NOTE — ED Notes (Signed)
Patient transported to CT 

## 2020-10-02 ENCOUNTER — Encounter (HOSPITAL_COMMUNITY): Payer: Self-pay | Admitting: Internal Medicine

## 2020-10-02 ENCOUNTER — Inpatient Hospital Stay (HOSPITAL_COMMUNITY): Payer: Federal, State, Local not specified - PPO

## 2020-10-02 DIAGNOSIS — K56609 Unspecified intestinal obstruction, unspecified as to partial versus complete obstruction: Secondary | ICD-10-CM

## 2020-10-02 DIAGNOSIS — E785 Hyperlipidemia, unspecified: Secondary | ICD-10-CM

## 2020-10-02 DIAGNOSIS — F39 Unspecified mood [affective] disorder: Secondary | ICD-10-CM

## 2020-10-02 HISTORY — DX: Unspecified intestinal obstruction, unspecified as to partial versus complete obstruction: K56.609

## 2020-10-02 LAB — CBC
HCT: 44.5 % (ref 36.0–46.0)
Hemoglobin: 14.6 g/dL (ref 12.0–15.0)
MCH: 32.7 pg (ref 26.0–34.0)
MCHC: 32.8 g/dL (ref 30.0–36.0)
MCV: 99.8 fL (ref 80.0–100.0)
Platelets: 219 10*3/uL (ref 150–400)
RBC: 4.46 MIL/uL (ref 3.87–5.11)
RDW: 12 % (ref 11.5–15.5)
WBC: 8.7 10*3/uL (ref 4.0–10.5)
nRBC: 0 % (ref 0.0–0.2)

## 2020-10-02 LAB — GLUCOSE, CAPILLARY
Glucose-Capillary: 122 mg/dL — ABNORMAL HIGH (ref 70–99)
Glucose-Capillary: 130 mg/dL — ABNORMAL HIGH (ref 70–99)
Glucose-Capillary: 80 mg/dL (ref 70–99)
Glucose-Capillary: 82 mg/dL (ref 70–99)

## 2020-10-02 LAB — COMPREHENSIVE METABOLIC PANEL
ALT: 20 U/L (ref 0–44)
AST: 28 U/L (ref 15–41)
Albumin: 4.2 g/dL (ref 3.5–5.0)
Alkaline Phosphatase: 59 U/L (ref 38–126)
Anion gap: 12 (ref 5–15)
BUN: 15 mg/dL (ref 8–23)
CO2: 33 mmol/L — ABNORMAL HIGH (ref 22–32)
Calcium: 9.7 mg/dL (ref 8.9–10.3)
Chloride: 99 mmol/L (ref 98–111)
Creatinine, Ser: 0.93 mg/dL (ref 0.44–1.00)
GFR, Estimated: >60 mL/min (ref >60)
Glucose, Bld: 144 mg/dL — ABNORMAL HIGH (ref 70–99)
Potassium: 3.6 mmol/L (ref 3.5–5.1)
Sodium: 144 mmol/L (ref 135–145)
Total Bilirubin: 0.6 mg/dL (ref 0.3–1.2)
Total Protein: 7.2 g/dL (ref 6.5–8.1)

## 2020-10-02 LAB — MAGNESIUM: Magnesium: 2.4 mg/dL (ref 1.7–2.4)

## 2020-10-02 MED ORDER — LORAZEPAM 2 MG/ML IJ SOLN
0.2500 mg | Freq: Two times a day (BID) | INTRAMUSCULAR | Status: DC | PRN
  Administered 2020-10-02 – 2020-10-03 (×3): 0.25 mg via INTRAVENOUS
  Filled 2020-10-02 (×3): qty 1

## 2020-10-02 MED ORDER — POTASSIUM CHLORIDE 2 MEQ/ML IV SOLN
INTRAVENOUS | Status: DC
  Filled 2020-10-02 (×6): qty 1000

## 2020-10-02 NOTE — Progress Notes (Signed)
Per Dr. Donne Hazel ok to clamp NG for 6 hours and then possibly DC NG depending on how he pt is doing. Will await to hear further instructions before [facility] NG.

## 2020-10-02 NOTE — Progress Notes (Signed)
Subjective/Chief Complaint: No flatus or bm yet, feels better, ng in place   Objective: Vital signs in last 24 hours: Temp:  [97.3 F (36.3 C)-98.3 F (36.8 C)] 97.3 F (36.3 C) (12/01 0501) Pulse Rate:  [61-82] 61 (12/01 0501) Resp:  [9-24] 16 (12/01 0501) BP: (137-169)/(68-94) 155/73 (12/01 0501) SpO2:  [87 %-100 %] 98 % (12/01 0501) Last BM Date: 10/01/20  Intake/Output from previous day: 11/30 0701 - 12/01 0700 In: 3323.9 [I.V.:2293.9; NG/GT:30; IV Piggyback:1000] Out: 1300 [Emesis/NG output:1300] Intake/Output this shift: No intake/output data recorded.  GI: soft not distended at all bs present nontender  Lab Results:  Recent Labs    09/30/20 1335 10/02/20 0017  WBC 9.1 8.7  HGB 15.3* 14.6  HCT 46.3* 44.5  PLT 212 219   BMET Recent Labs    09/30/20 1335 10/02/20 0017  NA 141 144  K 4.2 3.6  CL 98 99  CO2 30 33*  GLUCOSE 126* 144*  BUN 10 15  CREATININE 0.94 0.93  CALCIUM 10.2 9.7   PT/INR No results for input(s): LABPROT, INR in the last 72 hours. ABG No results for input(s): PHART, HCO3 in the last 72 hours.  Invalid input(s): PCO2, PO2  Studies/Results: CT ABDOMEN PELVIS W CONTRAST  Result Date: 10/01/2020 CLINICAL DATA:  Acute nonlocalized abdominal pain EXAM: CT ABDOMEN AND PELVIS WITH CONTRAST TECHNIQUE: Multidetector CT imaging of the abdomen and pelvis was performed using the standard protocol following bolus administration of intravenous contrast. CONTRAST:  175mL OMNIPAQUE IOHEXOL 300 MG/ML  SOLN COMPARISON:  04/17/2018 FINDINGS: Lower chest: The visualized lung bases are clear bilaterally. Bilateral breast implants are partially visualized. The visualized heart and pericardium are unremarkable. Hepatobiliary: No focal liver abnormality is seen. No gallstones, gallbladder wall thickening, or biliary dilatation. Pancreas: Unremarkable Spleen: Unremarkable Adrenals/Urinary Tract: Adrenal glands are unremarkable. Kidneys are normal, without  renal calculi, focal lesion, or hydronephrosis. Bladder is unremarkable. Stomach/Bowel: Partial small bowel resection has been performed with an anastomotic staple line seen within the right lower quadrant. A small bowel obstruction is present with a single point of transition identified within the infraumbilical abdomen, best seen on axial image # 45 and coronal image # 19. The proximal small bowel is dilated and fluid-filled measuring up to 4.7 cm in diameter just proximal to the point of obstruction. Distally, the small bowel is decompressed. The stomach and large bowel are unremarkable. The appendix is not clearly identified, however, there is no pericecal inflammatory change identified to suggest acute appendicitis. No free intraperitoneal gas or fluid. Vascular/Lymphatic: Retroaortic left renal vein. The abdominal vasculature is otherwise unremarkable. No pathologic adenopathy within the abdomen and pelvis. Reproductive: Status post hysterectomy. No adnexal masses. Other: Rectum unremarkable Musculoskeletal: No acute bone abnormality. Degenerative changes are noted within the lumbar spine, most severe at L4-5 with associated grade 2 anterolisthesis. IMPRESSION: Distal small bowel obstruction with point of transition seen within the infraumbilical abdomen Electronically Signed   By: Fidela Salisbury MD   On: 10/01/2020 03:57   DG Abd Portable 1V  Result Date: 10/02/2020 CLINICAL DATA:  Small bowel obstruction. EXAM: PORTABLE ABDOMEN - 1 VIEW COMPARISON:  October 01, 2020. FINDINGS: Small bowel dilatation noted on prior exam is significantly improved. Residual contrast and stool is seen in the colon. Phleboliths are noted in the pelvis. Nasogastric tube tip is seen in proximal stomach. IMPRESSION: Small bowel dilatation noted on prior exam is significantly improved. Electronically Signed   By: Marijo Conception M.D.   On:  10/02/2020 08:10   DG Abd Portable 1V-Small Bowel Obstruction Protocol-initial, 8 hr  delay  Result Date: 10/01/2020 CLINICAL DATA:  Small-bowel obstruction EXAM: PORTABLE ABDOMEN - 1 VIEW COMPARISON:  10/01/2020 FINDINGS: Supine frontal view of the abdomen and pelvis excludes the hemidiaphragms by collimation. Enteric catheter is seen within the gastric lumen. There is residual oral contrast within the stomach. Oral contrast is seen diffusing 3 dilated loops of small bowel throughout the central abdomen and pelvis. No definite contrast within the colon at this time. Excreted contrast within the bladder from previous CT. IMPRESSION: 1. Slow diffusion of oral contrast throughout dilated loops of small bowel. Findings are consistent with small-bowel obstruction. Electronically Signed   By: Randa Ngo M.D.   On: 10/01/2020 17:27   DG Abd Portable 1 View  Result Date: 10/01/2020 CLINICAL DATA:  NG tube placement. EXAM: PORTABLE ABDOMEN - 1 VIEW COMPARISON:  CT 10/01/2020. FINDINGS: NG tube noted with tip over the stomach. Persistent small-bowel distention. Stool noted in colon. No free air identified. IV contrast from prior CT in the renal collecting system and bladder. No acute bony abnormality identified. IMPRESSION: NG tube noted with tip over the stomach. Persistent small-bowel distention. Electronically Signed   By: Marcello Moores  Register   On: 10/01/2020 05:33    Anti-infectives: Anti-infectives (From admission, onward)   None      Assessment/Plan: SBO -clinically appears better and xray much better -will see her later today again and possibly clamp tube and give diet -appears she will resolve without surgery  Rolm Bookbinder 10/02/2020

## 2020-10-02 NOTE — Plan of Care (Signed)

## 2020-10-02 NOTE — Progress Notes (Signed)
PROGRESS NOTE    RAMSHA LONIGRO  KDX:[id number] DOB: [birth year] DOA: 09/30/2020 PCP: Denita Lung, MD    Brief Narrative:  64 year old female admitted to the hospital with abdominal pain, nausea and vomiting.  She has a previous history of small bowel obstruction and underwent operative management in 2019.  On arrival to the emergency room, CT abdomen confirmed small bowel obstruction with transition point in the infraumbilical area.  She was admitted for further management with NG tube decompression and general surgery consult.   Assessment & Plan:   Principal Problem:   SBO (small bowel obstruction) (HCC) Active Problems:   Hyperlipidemia with target LDL less than 100   Mood disorder (HCC)   Small bowel obstruction -Currently has NG tube for decompression -General surgery following -Await recovery of bowel function -Per general surgery, may consider clamping NG tube today starting on clear liquids  Hyperlipidemia -Resume statin once able to take p.o.  Mood disorder/anxiety -Resume home medications when able to tolerate p.o.  Elevated blood pressure -Suspect this is related to her acute illness -Continue to monitor, hold off on medications for now   DVT prophylaxis: enoxaparin (LOVENOX) injection 40 mg Start: 10/01/20 1615 SCDs Start: 10/01/20 9767  Code Status: Full code Family Communication: Updated husband at the bedside Disposition Plan: Status is: Inpatient  Remains inpatient appropriate because:IV treatments appropriate due to intensity of illness or inability to take PO   Dispo: The patient is from: Home              Anticipated d/c is to: Home              Anticipated d/c date is: 2 days              Patient currently is not medically stable to d/c.   Consultants:   General surgery  Procedures:     Antimicrobials:       Subjective: Had very small bowel movement this morning and reports passing some gas.  She continues to feel nauseous  and is requiring antiemetics.  She has approximately 350 cc of output from nasogastric tube since this morning.  Reports feeling dizzy on standing and was unable to ambulate.  Objective: Vitals:   10/01/20 1800 10/01/20 1826 10/01/20 2056 10/02/20 0501  BP: (!) 145/78 (!) 168/74 (!) 153/68 (!) 155/73  Pulse: 68 66 66 61  Resp: 13 17 16 16   Temp: 98.3 F (36.8 C) 98.2 F (36.8 C) 98.1 F (36.7 C) (!) 97.3 F (36.3 C)  TempSrc: Oral Oral Oral Oral  SpO2: 98% 100% 99% 98%  Weight:      Height:        Intake/Output Summary (Last 24 hours) at 10/02/2020 1232 Last data filed at 10/02/2020 1223 Gross per 24 hour  Intake 2323.87 ml  Output 1650 ml  Net 673.87 ml   Filed Weights   09/30/20 1315  Weight: 52.2 kg    Examination:  General exam: Appears calm and comfortable  Respiratory system: Clear to auscultation. Respiratory effort normal. Cardiovascular system: S1 & S2 heard, RRR. No JVD, murmurs, rubs, gallops or clicks. No pedal edema. Gastrointestinal system: Abdomen is nondistended, soft and diffusely tender. No organomegaly or masses felt. Normal bowel sounds heard. Central nervous system: Alert and oriented. No focal neurological deficits. Extremities: Symmetric 5 x 5 power. Skin: No rashes, lesions or ulcers Psychiatry: Judgement and insight appear normal. Mood & affect appropriate.     Data Reviewed: I have personally reviewed following labs  and imaging studies  CBC: Recent Labs  Lab 09/30/20 1335 10/02/20 0017  WBC 9.1 8.7  HGB 15.3* 14.6  HCT 46.3* 44.5  MCV 101.3* 99.8  PLT 212 856   Basic Metabolic Panel: Recent Labs  Lab 09/30/20 1335 10/02/20 0017  NA 141 144  K 4.2 3.6  CL 98 99  CO2 30 33*  GLUCOSE 126* 144*  BUN 10 15  CREATININE 0.94 0.93  CALCIUM 10.2 9.7  MG  --  2.4   GFR: Estimated Creatinine Clearance: 50.4 mL/min (by C-G formula based on SCr of 0.93 mg/dL). Liver Function Tests: Recent Labs  Lab 09/30/20 1335 10/02/20 0017    AST 36 28  ALT 24 20  ALKPHOS 63 59  BILITOT 0.6 0.6  PROT 7.9 7.2  ALBUMIN 4.9 4.2   Recent Labs  Lab 09/30/20 1335  LIPASE 25   No results for input(s): AMMONIA in the last 168 hours. Coagulation Profile: No results for input(s): INR, PROTIME in the last 168 hours. Cardiac Enzymes: No results for input(s): CKTOTAL, CKMB, CKMBINDEX, TROPONINI in the last 168 hours. BNP (last 3 results) No results for input(s): PROBNP in the last 8760 hours. HbA1C: No results for input(s): HGBA1C in the last 72 hours. CBG: Recent Labs  Lab 10/01/20 0737 10/01/20 1610 10/02/20 0023 10/02/20 0755  GLUCAP 108* 117* 130* 122*   Lipid Profile: No results for input(s): CHOL, HDL, LDLCALC, TRIG, CHOLHDL, LDLDIRECT in the last 72 hours. Thyroid Function Tests: No results for input(s): TSH, T4TOTAL, FREET4, T3FREE, THYROIDAB in the last 72 hours. Anemia Panel: No results for input(s): VITAMINB12, FOLATE, FERRITIN, TIBC, IRON, RETICCTPCT in the last 72 hours. Sepsis Labs: No results for input(s): PROCALCITON, LATICACIDVEN in the last 168 hours.  Recent Results (from the past 240 hour(s))  Resp Panel by RT-PCR (Flu A&B, Covid) Nasopharyngeal Swab     Status: None   Collection Time: 10/01/20  4:08 AM   Specimen: Nasopharyngeal Swab; Nasopharyngeal(NP) swabs in vial transport medium  Result Value Ref Range Status   SARS Coronavirus 2 by RT PCR NEGATIVE NEGATIVE Final    Comment: (NOTE) SARS-CoV-2 target nucleic acids are NOT DETECTED.  The SARS-CoV-2 RNA is generally detectable in upper respiratory specimens during the acute phase of infection. The lowest concentration of SARS-CoV-2 viral copies this assay can detect is 138 copies/mL. A negative result does not preclude SARS-Cov-2 infection and should not be used as the sole basis for treatment or other patient management decisions. A negative result may occur with  improper specimen collection/handling, submission of specimen other than  nasopharyngeal swab, presence of viral mutation(s) within the areas targeted by this assay, and inadequate number of viral copies(<138 copies/mL). A negative result must be combined with clinical observations, patient history, and epidemiological information. The expected result is Negative.  Fact Sheet for Patients:  EntrepreneurPulse.com.au  Fact Sheet for Healthcare Providers:  IncredibleEmployment.be  This test is no t yet approved or cleared by the [place] FDA and  has been authorized for detection and/or diagnosis of SARS-CoV-2 by FDA under an Emergency Use Authorization (EUA). This EUA will remain  in effect (meaning this test can be used) for the duration of the COVID-19 declaration under Section 564(b)(1) of the Act, 21 U.S.C.section 360bbb-3(b)(1), unless the authorization is terminated  or revoked sooner.       Influenza A by PCR NEGATIVE NEGATIVE Final   Influenza B by PCR NEGATIVE NEGATIVE Final    Comment: (NOTE) The Xpert Xpress SARS-CoV-2/FLU/RSV plus  assay is intended as an aid in the diagnosis of influenza from Nasopharyngeal swab specimens and should not be used as a sole basis for treatment. Nasal washings and aspirates are unacceptable for Xpert Xpress SARS-CoV-2/FLU/RSV testing.  Fact Sheet for Patients: EntrepreneurPulse.com.au  Fact Sheet for Healthcare Providers: IncredibleEmployment.be  This test is not yet approved or cleared by the [place] FDA and has been authorized for detection and/or diagnosis of SARS-CoV-2 by FDA under an Emergency Use Authorization (EUA). This EUA will remain in effect (meaning this test can be used) for the duration of the COVID-19 declaration under Section 564(b)(1) of the Act, 21 U.S.C. section 360bbb-3(b)(1), unless the authorization is terminated or revoked.  Performed at [facility], [place] [address], [place], [place] [address]           Radiology Studies: CT ABDOMEN PELVIS W CONTRAST  Result Date: 10/01/2020 CLINICAL DATA:  Acute nonlocalized abdominal pain EXAM: CT ABDOMEN AND PELVIS WITH CONTRAST TECHNIQUE: Multidetector CT imaging of the abdomen and pelvis was performed using the standard protocol following bolus administration of intravenous contrast. CONTRAST:  127mL OMNIPAQUE IOHEXOL 300 MG/ML  SOLN COMPARISON:  04/17/2018 FINDINGS: Lower chest: The visualized lung bases are clear bilaterally. Bilateral breast implants are partially visualized. The visualized heart and pericardium are unremarkable. Hepatobiliary: No focal liver abnormality is seen. No gallstones, gallbladder wall thickening, or biliary dilatation. Pancreas: Unremarkable Spleen: Unremarkable Adrenals/Urinary Tract: Adrenal glands are unremarkable. Kidneys are normal, without renal calculi, focal lesion, or hydronephrosis. Bladder is unremarkable. Stomach/Bowel: Partial small bowel resection has been performed with an anastomotic staple line seen within the right lower quadrant. A small bowel obstruction is present with a single point of transition identified within the infraumbilical abdomen, best seen on axial image # 45 and coronal image # 19. The proximal small bowel is dilated and fluid-filled measuring up to 4.7 cm in diameter just proximal to the point of obstruction. Distally, the small bowel is decompressed. The stomach and large bowel are unremarkable. The appendix is not clearly identified, however, there is no pericecal inflammatory change identified to suggest acute appendicitis. No free intraperitoneal gas or fluid. Vascular/Lymphatic: Retroaortic left renal vein. The abdominal vasculature is otherwise unremarkable. No pathologic adenopathy within the abdomen and pelvis. Reproductive: Status post hysterectomy. No adnexal masses. Other: Rectum unremarkable Musculoskeletal: No acute bone abnormality. Degenerative changes are noted within the  lumbar spine, most severe at L4-5 with associated grade 2 anterolisthesis. IMPRESSION: Distal small bowel obstruction with point of transition seen within the infraumbilical abdomen Electronically Signed   By: Fidela Salisbury MD   On: 10/01/2020 03:57   DG Abd Portable 1V  Result Date: 10/02/2020 CLINICAL DATA:  Small bowel obstruction. EXAM: PORTABLE ABDOMEN - 1 VIEW COMPARISON:  October 01, 2020. FINDINGS: Small bowel dilatation noted on prior exam is significantly improved. Residual contrast and stool is seen in the colon. Phleboliths are noted in the pelvis. Nasogastric tube tip is seen in proximal stomach. IMPRESSION: Small bowel dilatation noted on prior exam is significantly improved. Electronically Signed   By: Marijo Conception M.D.   On: 10/02/2020 08:10   DG Abd Portable 1V-Small Bowel Obstruction Protocol-initial, 8 hr delay  Result Date: 10/01/2020 CLINICAL DATA:  Small-bowel obstruction EXAM: PORTABLE ABDOMEN - 1 VIEW COMPARISON:  10/01/2020 FINDINGS: Supine frontal view of the abdomen and pelvis excludes the hemidiaphragms by collimation. Enteric catheter is seen within the gastric lumen. There is residual oral contrast within the stomach. Oral contrast is seen diffusing  3 dilated loops of small bowel throughout the central abdomen and pelvis. No definite contrast within the colon at this time. Excreted contrast within the bladder from previous CT. IMPRESSION: 1. Slow diffusion of oral contrast throughout dilated loops of small bowel. Findings are consistent with small-bowel obstruction. Electronically Signed   By: Randa Ngo M.D.   On: 10/01/2020 17:27   DG Abd Portable 1 View  Result Date: 10/01/2020 CLINICAL DATA:  NG tube placement. EXAM: PORTABLE ABDOMEN - 1 VIEW COMPARISON:  CT 10/01/2020. FINDINGS: NG tube noted with tip over the stomach. Persistent small-bowel distention. Stool noted in colon. No free air identified. IV contrast from prior CT in the renal collecting system and  bladder. No acute bony abnormality identified. IMPRESSION: NG tube noted with tip over the stomach. Persistent small-bowel distention. Electronically Signed   By: Marcello Moores  Register   On: 10/01/2020 05:33        Scheduled Meds: . enoxaparin (LOVENOX) injection  40 mg Subcutaneous Q24H   Continuous Infusions: . lactated ringers with kcl       LOS: 1 day    Time spent: 37mins    Kathie Dike, MD [facility]   If 7PM-7AM, please contact night-coverage [email or web address]  10/02/2020, 12:32 PM

## 2020-10-03 LAB — BASIC METABOLIC PANEL
Anion gap: 12 (ref 5–15)
BUN: 15 mg/dL (ref 8–23)
CO2: 28 mmol/L (ref 22–32)
Calcium: 9.4 mg/dL (ref 8.9–10.3)
Chloride: 105 mmol/L (ref 98–111)
Creatinine, Ser: 0.85 mg/dL (ref 0.44–1.00)
GFR, Estimated: >60 mL/min (ref >60)
Glucose, Bld: 80 mg/dL (ref 70–99)
Potassium: 3.7 mmol/L (ref 3.5–5.1)
Sodium: 145 mmol/L (ref 135–145)

## 2020-10-03 LAB — GLUCOSE, CAPILLARY
Glucose-Capillary: 115 mg/dL — ABNORMAL HIGH (ref 70–99)
Glucose-Capillary: 69 mg/dL — ABNORMAL LOW (ref 70–99)
Glucose-Capillary: 103 mg/dL — ABNORMAL HIGH (ref 70–99)

## 2020-10-03 MED ORDER — OXYCODONE-ACETAMINOPHEN 5-325 MG PO TABS
1.0000 | ORAL_TABLET | Freq: Four times a day (QID) | ORAL | 0 refills | Status: DC | PRN
Start: 2020-10-03 — End: 2021-01-21

## 2020-10-03 MED ORDER — OXYCODONE-ACETAMINOPHEN 7.5-325 MG PO TABS
2.0000 | ORAL_TABLET | Freq: Four times a day (QID) | ORAL | Status: DC | PRN
  Administered 2020-10-03: 2 via ORAL
  Filled 2020-10-03: qty 2

## 2020-10-03 MED ORDER — GABAPENTIN 400 MG PO CAPS
1200.0000 mg | ORAL_CAPSULE | Freq: Two times a day (BID) | ORAL | Status: DC
  Administered 2020-10-03: 1200 mg via ORAL
  Filled 2020-10-03: qty 3

## 2020-10-03 MED ORDER — HYDROCODONE-ACETAMINOPHEN 5-325 MG PO TABS
1.0000 | ORAL_TABLET | Freq: Four times a day (QID) | ORAL | Status: DC | PRN
  Administered 2020-10-03: 1 via ORAL
  Filled 2020-10-03: qty 1

## 2020-10-03 MED ORDER — DEXTROSE 50 % IV SOLN
INTRAVENOUS | Status: AC
  Administered 2020-10-03: 12.5 mL
  Filled 2020-10-03: qty 50

## 2020-10-03 MED ORDER — METHOCARBAMOL 500 MG PO TABS: 500.0000 mg | ORAL_TABLET | Freq: Four times a day (QID) | ORAL | 0 refills | Status: DC | PRN

## 2020-10-03 MED ORDER — LAMOTRIGINE 100 MG PO TABS
100.0000 mg | ORAL_TABLET | Freq: Two times a day (BID) | ORAL | Status: DC
  Administered 2020-10-03: 100 mg via ORAL
  Filled 2020-10-03: qty 1

## 2020-10-03 MED ORDER — PROMETHAZINE HCL 25 MG RE SUPP: 25.0000 mg | Freq: Four times a day (QID) | RECTAL | 1 refills | Status: DC | PRN

## 2020-10-03 MED ORDER — POLYETHYLENE GLYCOL 3350 17 G PO PACK: 17.0000 g | PACK | Freq: Every day | ORAL | 0 refills | Status: AC

## 2020-10-03 NOTE — Progress Notes (Signed)
Nsg Discharge Note  Admit Date:  09/30/2020 Discharge date: 10/03/2020   Bethany [name] to be D/C'd home per MD order.  AVS completed. Patient/caregiver able to verbalize understanding.  Discharge Medication: Allergies as of 10/03/2020      Reactions   Morphine And Related Nausea Only      Medication List    STOP taking these medications   doxycycline 100 MG capsule Commonly known as: VIBRAMYCIN   erythromycin ophthalmic ointment   oxyCODONE-acetaminophen 7.5-325 MG tablet Commonly known as: Percocet Replaced by: oxyCODONE-acetaminophen 5-325 MG tablet     TAKE these medications   allopurinol 100 MG tablet Commonly known as: Zyloprim Take 2 tablets (200 mg total) by mouth daily.   b complex vitamins tablet Take 1 tablet by mouth daily.   bisacodyl 5 MG EC tablet Commonly known as: DULCOLAX Take 1 tablet (5 mg total) by mouth daily as needed for moderate constipation.   cholecalciferol 1000 units tablet Commonly known as: VITAMIN D Take 1,000 Units by mouth daily.   docusate sodium 100 MG capsule Commonly known as: COLACE Take 1 capsule (100 mg total) by mouth 2 (two) times daily.   ibuprofen 800 MG tablet Commonly known as: ADVIL Take 1 tablet (800 mg total) by mouth every 8 (eight) hours as needed.   lamoTRIgine 100 MG tablet Commonly known as: LAMICTAL Take 100 mg by mouth 2 (two) times daily.   Lotemax SM 0.38 % Gel Generic drug: Loteprednol Etabonate Apply 1 drop to eye 4 (four) times daily.   methocarbamol 500 MG tablet Commonly known as: Robaxin Take 1 tablet (500 mg total) by mouth every 6 (six) hours as needed for muscle spasms.   Neurontin 600 MG tablet Generic drug: gabapentin Take 1,200 mg by mouth 2 (two) times daily.   oxyCODONE-acetaminophen 5-325 MG tablet Commonly known as: Percocet Take 1-2 tablets by mouth every 6 (six) hours as needed for severe pain. Replaces: oxyCODONE-acetaminophen 7.5-325 MG tablet   polyethylene glycol 17  g packet Commonly known as: MiraLax Take 17 g by mouth daily.   Proctozone-HC 2.5 % rectal cream Generic drug: hydrocortisone apply rectally twice a day What changed: See the new instructions.   promethazine 25 MG suppository Commonly known as: Phenergan Place 1 suppository (25 mg total) rectally every 6 (six) hours as needed for nausea.   Restasis 0.05 % ophthalmic emulsion Generic drug: cycloSPORINE INSTILL 1 DROP IN BOTH EYES TWICE DAILY   simvastatin 20 MG tablet Commonly known as: ZOCOR Take 1 tablet (20 mg total) by mouth daily.   traZODone 50 MG tablet Commonly known as: DESYREL Take 50 mg by mouth at bedtime.   vitamin C 500 MG tablet Commonly known as: ASCORBIC ACID Take 500 mg by mouth daily.   Vitamin D (Ergocalciferol) 1.25 MG (50000 UNIT) Caps capsule Commonly known as: DRISDOL TAKE 1 CAPSULE BY MOUTH EVERY 7 DAYS       Discharge Assessment: Vitals:   10/03/20 0454 10/03/20 1409  BP: 138/74 123/72  Pulse: 66 75  Resp: 16 18  Temp: 98 F (36.7 C) 98.2 F (36.8 C)  SpO2: 100% 98%   Skin clean, dry and intact without evidence of skin break down, no evidence of skin tears noted. IV catheter discontinued intact. Site without signs and symptoms of complications - no redness or edema noted at insertion site, patient denies c/o pain - only slight tenderness at site.  Dressing with slight pressure applied.  D/c Instructions-Education: Discharge instructions given to patient/family with  verbalized understanding. D/c education completed with patient/family including follow up instructions, medication list, d/c activities limitations if indicated, with other d/c instructions as indicated by MD - patient able to verbalize understanding, all questions fully answered. Patient instructed to return to ED, call 911, or call MD for any changes in condition.  Patient escorted via [place], and D/C home via private auto.  Atilano Ina, RN 10/03/2020 6:11 PM

## 2020-10-03 NOTE — Discharge Instructions (Signed)
Bowel Obstruction A bowel obstruction means that something is blocking the small or large bowel. The bowel is also called the intestine. It is the long tube that connects the stomach to the opening of the butt (anus). When something blocks the bowel, food and fluids cannot pass through like normal. This condition needs to be treated. Treatment depends on the cause of the problem and how bad the problem is. What are the causes? Common causes of this condition include:  Scar tissue (adhesions) from past surgery or from high-energy X-rays (radiation).  Recent surgery in the belly. This affects how food moves in the bowel.  Some diseases, such as: ? Irritation of the lining of the digestive tract (Crohn's disease). ? Irritation of small pouches in the bowel (diverticulitis).  Growths or tumors.  A bulging organ (hernia).  Twisting of the bowel (volvulus).  A foreign body.  Slipping of a part of the bowel into another part (intussusception). What are the signs or symptoms? Symptoms of this condition include:  Pain in the belly.  Feeling sick to your stomach (nauseous).  Throwing up (vomiting).  Bloating in the belly.  Being unable to pass gas.  Trouble pooping (constipation).  Watery poop (diarrhea).  A lot of belching. How is this diagnosed? This condition may be diagnosed based on:  A physical exam.  Medical history.  Imaging tests, such as X-ray or CT scan.  Blood tests.  Urine tests. How is this treated? Treatment for this condition may include:  Fluids and pain medicines that are given through an IV tube. Your doctor may tell you not to eat or drink if you feel sick to your stomach and are throwing up.  Eating a clear liquid diet for a few days.  Putting a small tube (nasogastric tube) into the stomach. This will help with pain, discomfort, and nausea by removing blocked air and fluids from the stomach.  Surgery. This may be needed if other treatments  do not work. Follow these instructions at home: Medicines  Take over-the-counter and prescription medicines only as told by your doctor.  If you were prescribed an antibiotic medicine, take it as told by your doctor. Do not stop taking the antibiotic even if you start to feel better. General instructions  Follow your diet as told by your doctor. You may need to: ? Only drink clear liquids until you start to get better. ? Avoid solid foods.  Return to your normal activities as told by your doctor. Ask your doctor what activities are safe for you.  Do not sit for a long time without moving. Get up to take short walks every 1-2 hours. This is important. Ask for help if you feel weak or unsteady.  Keep all follow-up visits as told by your doctor. This is important. How is this prevented? After having a bowel obstruction, you may be more likely to have another. You can do some things to stop it from happening again.  If you have a long-term (chronic) disease, contact your doctor if you see changes or problems.  Take steps to prevent or treat trouble pooping. Your doctor may ask that you: ? Drink enough fluid to keep your pee (urine) pale yellow. ? Take over-the-counter or prescription medicines. ? Eat foods that are high in fiber. These include beans, whole grains, and fresh fruits and vegetables. ? Limit foods that are high in fat and sugar. These include fried or sweet foods.  Stay active. Ask your doctor which exercises  are safe for you.  Avoid stress.  Eat three small meals and three small snacks each day.  Work with a Publishing rights manager (dietitian) to make a meal plan that works for you.  Do not use any products that contain nicotine or tobacco, such as cigarettes and e-cigarettes. If you need help quitting, ask your doctor. Contact a doctor if:  You have a fever.  You have chills. Get help right away if:  You have pain or cramps that get worse.  You throw up blood.  You are  sick to your stomach.  You cannot stop throwing up.  You cannot drink fluids.  You feel mixed up (confused).  You feel very thirsty (dehydrated).  Your belly gets more bloated.  You feel weak or you pass out (faint). Summary  A bowel obstruction means that something is blocking the small or large bowel.  Treatment may include IV fluids and pain medicine. You may also have a clear liquid diet, a small tube in your stomach, or surgery.  Drink clear liquids and avoid solid foods until you get better. This information is not intended to replace advice given to you by your health care provider. Make sure you discuss any questions you have with your health care provider. Document Revised: 03/02/2018 Document Reviewed: 03/02/2018 Elsevier Patient Education  [place]. Low-Fiber Eating Plan Fiber is found in fruits, vegetables, whole grains, and beans. Eating a diet low in fiber helps to reduce how often you have bowel movements and how much you produce during a bowel movement. A low-fiber eating plan may help your digestive system heal if:  You have certain conditions, such as Crohn's disease or diverticulitis.  You recently had radiation therapy on your pelvis or bowel.  You recently had intestinal surgery.  You have a new surgical opening in your abdomen (colostomy or ileostomy).  Your intestine is narrowed (stricture). Your health care provider will determine how long you need to stay on this diet. Your health care provider may recommend that you work with a diet and nutrition specialist (dietitian). What are tips for following this plan? General guidelines  Follow recommendations from your dietitian about how much fiber you should have each day.  Most people on this eating plan should try to eat less than 10 grams (g) of fiber each day. Your daily fiber goal is _________________ g.  Take vitamin and mineral supplements as told by your health care provider or  dietitian. Chewable or liquid forms are best when on this eating plan. Reading food labels  Check food labels for the amount of dietary fiber.  Choose foods that have less than 2 grams of fiber in one serving. Cooking  Use white flour and other allowed grains for baking and cooking.  Cook meat using methods that keep it tender, such as braising or poaching.  Cook eggs until the yolk is completely solid.  Cook with healthy oils, such as olive oil or canola oil. Meal planning   Eat 5-6 small meals throughout the day instead of 3 large meals.  If you are lactose intolerant: ? Choose low-lactose dairy foods. ? Do not eat dairy foods, if told by your dietitian.  Limit fat and oils to less than 8 teaspoons a day.  Eat small portions of desserts. What foods are allowed? The items listed below may not be a complete list. Talk with your dietitian about what dietary choices are best for you. Grains All bread and crackers made with white flour.  Waffles, pancakes, and [place] toast. Bagels. Pretzels. Melba toast, zwieback, and matzoh. Cooked and dried cereals that do not contain whole grains, added fiber, seeds, or dried fruit. CornmealDomenick Gong. Hot and cold cereals made with refined corn, wheat, rice, or oats. Plain pasta and noodles. White rice. Vegetables Well-cooked or canned vegetables without skin, seeds, or stems. Cooked potatoes without skins. Vegetable juice. Fruits Soft-cooked or canned fruits without skin and seeds. Peeled ripe banana. Applesauce. Fruit juice without pulp. Meats and other protein foods Ground meat. Tender cuts of meat or poultry. Eggs. Fish, seafood, and shellfish. Smooth nut butters. Tofu. Dairy All milk products and drinks. Lactose-free milks, including rice, soy, and almond milks. Yogurt without fruit, nuts, chocolate, or granola mix-ins. Sour cream. Cottage cheese. Cheese. Beverages Decaf coffee. Fruit and vegetable juices or smoothies (in small amounts,  with no pulp or skins, and with fruits from allowed list). Sports drinks. Herbal tea. Fats and oils Olive oil, canola oil, sunflower oil, flaxseed oil, and grapeseed oil. Mayonnaise. Cream cheese. Margarine. Butter. Sweets and desserts Plain cakes and cookies. Cream pies and pies made with allowed fruits. Pudding. Custard. Fruit gelatin. Sherbet. Popsicles. Ice cream without nuts. Plain hard candy. Honey. Jelly. Molasses. Syrups, including chocolate syrup. Chocolate. Marshmallows. Gumdrops. Seasoning and other foods Bouillon. Broth. Cream soups made from allowed foods. Strained soup. Casseroles made with allowed foods. Ketchup. Mild mustard. Mild salad dressings. Plain gravies. Vinegar. Spices in moderation. Salt. Sugar. What foods are not allowed? The items listed below may not be a complete list. Talk with your dietitian about what dietary choices are best for you. Grains Whole wheat and whole grain breads and crackers. Multigrain breads and crackers. Rye bread. Whole grain or multigrain cereals. Cereals with nuts, raisins, or coconut. Bran. Coarse wheat cereals. Granola. High-fiber cereals. Cornmeal or corn bread. Whole grain pasta. Wild or brown rice. Quinoa. Popcorn. Buckwheat. Wheat germ. Vegetables Potato skins. Raw or undercooked vegetables. All beans and bean sprouts. Cooked greens. Corn. Peas. Cabbage. Beets. Broccoli. Brussels sprouts. Cauliflower. Mushrooms. Onions. Peppers. Parsnips. Okra. Sauerkraut. Fruit Raw or dried fruit. Berries. Fruit juice with pulp. Prune juice. Meats and other protein foods Tough, fibrous meats with gristle. Fatty meat. Poultry with skin. Fried meat, Sales executive, or fish. Deli or lunch meats. Sausage, bacon, and hot dogs. Nuts and chunky nut butter. Dried peas, beans, and lentils. Dairy Yogurt with fruit, nuts, chocolate, or granola mix-ins. Beverages Caffeinated coffee and teas. Fats and oils Avocado. Coconut. Sweets and desserts Desserts, cookies, or  candies that contain nuts or coconut. Dried fruit. Jams and preserves with seeds. Marmalade. Any dessert made with fruits or grains that are not allowed. Seasoning and other foods Corn tortilla chips. Soups made with vegetables or grains that are not allowed. Relish. Horseradish. Angie Fava. Olives. Summary  Most people on a low-fiber eating plan should eat less than 10 grams of fiber a day. Follow recommendations from your dietitian about how much fiber you should have each day.  Always check food labels to see the dietary fiber content of packaged foods. In general, a low-fiber food will have fewer than 2 grams of fiber per serving.  In general, try to avoid whole grains, raw fruits and vegetables, dried fruit, tough cuts of meat, nuts, and seeds.  Take a vitamin and mineral supplement as told by your health care provider or dietitian. This information is not intended to replace advice given to you by your health care provider. Make sure you discuss any questions you have with your  health care provider. Document Revised: 02/10/2019 Document Reviewed: 12/22/2016 Elsevier Patient Education  2020 [employer].

## 2020-10-03 NOTE — Discharge Summary (Signed)
Physician Discharge Summary  Bethany [name] QBH:[id number] DOB: 1955-12-31 DOA: 09/30/2020  PCP: Denita Lung, MD  Admit date: 09/30/2020 Discharge date: 10/03/2020  Admitted From: Home Disposition: Home  Recommendations for Outpatient Follow-up:  1. Follow up with PCP in 1-2 weeks 2. Please obtain BMP/CBC in one week 3. Follow-up with [facility] surgery if symptoms recur  Home Health: Equipment/Devices:  Discharge Condition: Stable CODE STATUS: Full code Diet recommendation: Heart healthy  Brief/Interim Summary: 64 year old female admitted to the hospital with abdominal pain, nausea and vomiting.  She has a previous history of small bowel obstruction and underwent operative management in 2019.  On arrival to the emergency room, CT abdomen confirmed small bowel obstruction with transition point in the infraumbilical area.  She was admitted for further management with NG tube decompression and general surgery consult.  Discharge Diagnoses:  Principal Problem:   SBO (small bowel obstruction) (HCC) Active Problems:   Hyperlipidemia with target LDL less than 100   Mood disorder (HCC)  Small bowel obstruction -Followed by general surgery -Received NG tube decompression conservative management -Fortunately, her symptoms have improved -NG tube was eventually discontinued and diet was advanced -She is tolerating solid diet and having bowel movements  The remainder of her medications were resumed when she was able to tolerate p.o. intake. She is otherwise felt stable for discharge  Discharge Instructions  Discharge Instructions    Diet - low sodium heart healthy   Complete by: As directed    Increase activity slowly   Complete by: As directed      Allergies as of 10/03/2020      Reactions   Morphine And Related Nausea Only      Medication List    STOP taking these medications   doxycycline 100 MG capsule Commonly known as: VIBRAMYCIN   erythromycin  ophthalmic ointment   oxyCODONE-acetaminophen 7.5-325 MG tablet Commonly known as: Percocet Replaced by: oxyCODONE-acetaminophen 5-325 MG tablet     TAKE these medications   allopurinol 100 MG tablet Commonly known as: Zyloprim Take 2 tablets (200 mg total) by mouth daily.   b complex vitamins tablet Take 1 tablet by mouth daily.   bisacodyl 5 MG EC tablet Commonly known as: DULCOLAX Take 1 tablet (5 mg total) by mouth daily as needed for moderate constipation.   cholecalciferol 1000 units tablet Commonly known as: VITAMIN D Take 1,000 Units by mouth daily.   docusate sodium 100 MG capsule Commonly known as: COLACE Take 1 capsule (100 mg total) by mouth 2 (two) times daily.   ibuprofen 800 MG tablet Commonly known as: ADVIL Take 1 tablet (800 mg total) by mouth every 8 (eight) hours as needed.   lamoTRIgine 100 MG tablet Commonly known as: LAMICTAL Take 100 mg by mouth 2 (two) times daily.   Lotemax SM 0.38 % Gel Generic drug: Loteprednol Etabonate Apply 1 drop to eye 4 (four) times daily.   methocarbamol 500 MG tablet Commonly known as: Robaxin Take 1 tablet (500 mg total) by mouth every 6 (six) hours as needed for muscle spasms.   Neurontin 600 MG tablet Generic drug: gabapentin Take 1,200 mg by mouth 2 (two) times daily.   oxyCODONE-acetaminophen 5-325 MG tablet Commonly known as: Percocet Take 1-2 tablets by mouth every 6 (six) hours as needed for severe pain. Replaces: oxyCODONE-acetaminophen 7.5-325 MG tablet   polyethylene glycol 17 g packet Commonly known as: MiraLax Take 17 g by mouth daily.   Proctozone-HC 2.5 % rectal cream Generic drug: hydrocortisone  apply rectally twice a day What changed: See the new instructions.   promethazine 25 MG suppository Commonly known as: Phenergan Place 1 suppository (25 mg total) rectally every 6 (six) hours as needed for nausea.   Restasis 0.05 % ophthalmic emulsion Generic drug: cycloSPORINE INSTILL 1  DROP IN BOTH EYES TWICE DAILY   simvastatin 20 MG tablet Commonly known as: ZOCOR Take 1 tablet (20 mg total) by mouth daily.   traZODone 50 MG tablet Commonly known as: DESYREL Take 50 mg by mouth at bedtime.   vitamin C 500 MG tablet Commonly known as: ASCORBIC ACID Take 500 mg by mouth daily.   Vitamin D (Ergocalciferol) 1.25 MG (50000 UNIT) Caps capsule Commonly known as: DRISDOL TAKE 1 CAPSULE BY MOUTH EVERY 7 DAYS       Allergies  Allergen Reactions  . Morphine And Related Nausea Only    Consultations:  General surgery   Procedures/Studies: DG Lumbar Spine Complete  Result Date: 09/24/2020 CLINICAL DATA:  Right sacroiliac pain EXAM: LUMBAR SPINE - COMPLETE 4+ VIEW COMPARISON:  05/02/2020 FINDINGS: Grade 1 anterolisthesis at L4-5, unchanged. No acute fracture. Unchanged mild lower facet arthrosis. IMPRESSION: Unchanged grade 1 anterolisthesis at L4-5. No acute findings. Electronically Signed   By: Ulyses Jarred M.D.   On: 09/24/2020 03:23   DG Si Joints  Result Date: 09/24/2020 CLINICAL DATA:  SI joint pain, bilateral low back pain radiates in right buttock EXAM: BILATERAL SACROILIAC JOINTS - 3+ VIEW COMPARISON:  None. FINDINGS: Hip joints and SI joints are symmetric and unremarkable. No acute bony abnormality or focal bone lesion. IMPRESSION: Negative. Electronically Signed   By: Rolm Baptise M.D.   On: 09/24/2020 08:14   CT ABDOMEN PELVIS W CONTRAST  Result Date: 10/01/2020 CLINICAL DATA:  Acute nonlocalized abdominal pain EXAM: CT ABDOMEN AND PELVIS WITH CONTRAST TECHNIQUE: Multidetector CT imaging of the abdomen and pelvis was performed using the standard protocol following bolus administration of intravenous contrast. CONTRAST:  157mL OMNIPAQUE IOHEXOL 300 MG/ML  SOLN COMPARISON:  04/17/2018 FINDINGS: Lower chest: The visualized lung bases are clear bilaterally. Bilateral breast implants are partially visualized. The visualized heart and pericardium are  unremarkable. Hepatobiliary: No focal liver abnormality is seen. No gallstones, gallbladder wall thickening, or biliary dilatation. Pancreas: Unremarkable Spleen: Unremarkable Adrenals/Urinary Tract: Adrenal glands are unremarkable. Kidneys are normal, without renal calculi, focal lesion, or hydronephrosis. Bladder is unremarkable. Stomach/Bowel: Partial small bowel resection has been performed with an anastomotic staple line seen within the right lower quadrant. A small bowel obstruction is present with a single point of transition identified within the infraumbilical abdomen, best seen on axial image # 45 and coronal image # 19. The proximal small bowel is dilated and fluid-filled measuring up to 4.7 cm in diameter just proximal to the point of obstruction. Distally, the small bowel is decompressed. The stomach and large bowel are unremarkable. The appendix is not clearly identified, however, there is no pericecal inflammatory change identified to suggest acute appendicitis. No free intraperitoneal gas or fluid. Vascular/Lymphatic: Retroaortic left renal vein. The abdominal vasculature is otherwise unremarkable. No pathologic adenopathy within the abdomen and pelvis. Reproductive: Status post hysterectomy. No adnexal masses. Other: Rectum unremarkable Musculoskeletal: No acute bone abnormality. Degenerative changes are noted within the lumbar spine, most severe at L4-5 with associated grade 2 anterolisthesis. IMPRESSION: Distal small bowel obstruction with point of transition seen within the infraumbilical abdomen Electronically Signed   By: Fidela Salisbury MD   On: 10/01/2020 03:57   DG Abd Portable  1V  Result Date: 10/02/2020 CLINICAL DATA:  Small bowel obstruction. EXAM: PORTABLE ABDOMEN - 1 VIEW COMPARISON:  October 01, 2020. FINDINGS: Small bowel dilatation noted on prior exam is significantly improved. Residual contrast and stool is seen in the colon. Phleboliths are noted in the pelvis. Nasogastric tube  tip is seen in proximal stomach. IMPRESSION: Small bowel dilatation noted on prior exam is significantly improved. Electronically Signed   By: Marijo Conception M.D.   On: 10/02/2020 08:10   DG Abd Portable 1V-Small Bowel Obstruction Protocol-initial, 8 hr delay  Result Date: 10/01/2020 CLINICAL DATA:  Small-bowel obstruction EXAM: PORTABLE ABDOMEN - 1 VIEW COMPARISON:  10/01/2020 FINDINGS: Supine frontal view of the abdomen and pelvis excludes the hemidiaphragms by collimation. Enteric catheter is seen within the gastric lumen. There is residual oral contrast within the stomach. Oral contrast is seen diffusing 3 dilated loops of small bowel throughout the central abdomen and pelvis. No definite contrast within the colon at this time. Excreted contrast within the bladder from previous CT. IMPRESSION: 1. Slow diffusion of oral contrast throughout dilated loops of small bowel. Findings are consistent with small-bowel obstruction. Electronically Signed   By: Randa Ngo M.D.   On: 10/01/2020 17:27   DG Abd Portable 1 View  Result Date: 10/01/2020 CLINICAL DATA:  NG tube placement. EXAM: PORTABLE ABDOMEN - 1 VIEW COMPARISON:  CT 10/01/2020. FINDINGS: NG tube noted with tip over the stomach. Persistent small-bowel distention. Stool noted in colon. No free air identified. IV contrast from prior CT in the renal collecting system and bladder. No acute bony abnormality identified. IMPRESSION: NG tube noted with tip over the stomach. Persistent small-bowel distention. Electronically Signed   By: Marcello Moores  Register   On: 10/01/2020 05:33       Subjective: Nausea and vomiting resolved, tolerating solid food, having bowel movements and passing gas.  Abdominal pain has improved  Discharge Exam: Vitals:   10/02/20 1332 10/02/20 2102 10/03/20 0454 10/03/20 1409  BP: (!) 141/78 136/73 138/74 123/72  Pulse: 64 60 66 75  Resp: 14 16 16 18   Temp: 98.3 F (36.8 C) 97.6 F (36.4 C) 98 F (36.7 C) 98.2 F (36.8  C)  TempSrc: Oral   Oral  SpO2: 97% 99% 100% 98%  Weight:      Height:        General: Pt is alert, awake, not in acute distress Cardiovascular: RRR, S1/S2 +, no rubs, no gallops Respiratory: CTA bilaterally, no wheezing, no rhonchi Abdominal: Soft, NT, ND, bowel sounds + Extremities: no edema, no cyanosis    The results of significant diagnostics from this hospitalization (including imaging, microbiology, ancillary and laboratory) are listed below for reference.     Microbiology: Recent Results (from the past 240 hour(s))  Resp Panel by RT-PCR (Flu A&B, Covid) Nasopharyngeal Swab     Status: None   Collection Time: 10/01/20  4:08 AM   Specimen: Nasopharyngeal Swab; Nasopharyngeal(NP) swabs in vial transport medium  Result Value Ref Range Status   SARS Coronavirus 2 by RT PCR NEGATIVE NEGATIVE Final    Comment: (NOTE) SARS-CoV-2 target nucleic acids are NOT DETECTED.  The SARS-CoV-2 RNA is generally detectable in upper respiratory specimens during the acute phase of infection. The lowest concentration of SARS-CoV-2 viral copies this assay can detect is 138 copies/mL. A negative result does not preclude SARS-Cov-2 infection and should not be used as the sole basis for treatment or other patient management decisions. A negative result may occur with  improper  specimen collection/handling, submission of specimen other than nasopharyngeal swab, presence of viral mutation(s) within the areas targeted by this assay, and inadequate number of viral copies(<138 copies/mL). A negative result must be combined with clinical observations, patient history, and epidemiological information. The expected result is Negative.  Fact Sheet for Patients:  EntrepreneurPulse.com.au  Fact Sheet for Healthcare Providers:  IncredibleEmployment.be  This test is no t yet approved or cleared by the [place] FDA and  has been authorized for detection and/or  diagnosis of SARS-CoV-2 by FDA under an Emergency Use Authorization (EUA). This EUA will remain  in effect (meaning this test can be used) for the duration of the COVID-19 declaration under Section 564(b)(1) of the Act, 21 U.S.C.section 360bbb-3(b)(1), unless the authorization is terminated  or revoked sooner.       Influenza A by PCR NEGATIVE NEGATIVE Final   Influenza B by PCR NEGATIVE NEGATIVE Final    Comment: (NOTE) The Xpert Xpress SARS-CoV-2/FLU/RSV plus assay is intended as an aid in the diagnosis of influenza from Nasopharyngeal swab specimens and should not be used as a sole basis for treatment. Nasal washings and aspirates are unacceptable for Xpert Xpress SARS-CoV-2/FLU/RSV testing.  Fact Sheet for Patients: EntrepreneurPulse.com.au  Fact Sheet for Healthcare Providers: IncredibleEmployment.be  This test is not yet approved or cleared by the [place] FDA and has been authorized for detection and/or diagnosis of SARS-CoV-2 by FDA under an Emergency Use Authorization (EUA). This EUA will remain in effect (meaning this test can be used) for the duration of the COVID-19 declaration under Section 564(b)(1) of the Act, 21 U.S.C. section 360bbb-3(b)(1), unless the authorization is terminated or revoked.  Performed at [facility], [place] [address], [place], [place] [address]      Labs: BNP (last 3 results) No results for input(s): BNP in the last 8760 hours. Basic Metabolic Panel: Recent Labs  Lab 09/30/20 1335 10/02/20 0017 10/03/20 0425  NA 141 144 145  K 4.2 3.6 3.7  CL 98 99 105  CO2 30 33* 28  GLUCOSE 126* 144* 80  BUN 10 15 15   CREATININE 0.94 0.93 0.85  CALCIUM 10.2 9.7 9.4  MG  --  2.4  --    Liver Function Tests: Recent Labs  Lab 09/30/20 1335 10/02/20 0017  AST 36 28  ALT 24 20  ALKPHOS 63 59  BILITOT 0.6 0.6  PROT 7.9 7.2  ALBUMIN 4.9 4.2   Recent Labs  Lab 09/30/20 1335  LIPASE 25    No results for input(s): AMMONIA in the last 168 hours. CBC: Recent Labs  Lab 09/30/20 1335 10/02/20 0017  WBC 9.1 8.7  HGB 15.3* 14.6  HCT 46.3* 44.5  MCV 101.3* 99.8  PLT 212 219   Cardiac Enzymes: No results for input(s): CKTOTAL, CKMB, CKMBINDEX, TROPONINI in the last 168 hours. BNP: Invalid input(s): POCBNP CBG: Recent Labs  Lab 10/02/20 1718 10/02/20 2355 10/03/20 0749 10/03/20 0820 10/03/20 1637  GLUCAP 80 82 69* 115* 103*   D-Dimer No results for input(s): DDIMER in the last 72 hours. Hgb A1c No results for input(s): HGBA1C in the last 72 hours. Lipid Profile No results for input(s): CHOL, HDL, LDLCALC, TRIG, CHOLHDL, LDLDIRECT in the last 72 hours. Thyroid function studies No results for input(s): TSH, T4TOTAL, T3FREE, THYROIDAB in the last 72 hours.  Invalid input(s): FREET3 Anemia work up No results for input(s): VITAMINB12, FOLATE, FERRITIN, TIBC, IRON, RETICCTPCT in the last 72 hours. Urinalysis    Component Value Date/Time  COLORURINE YELLOW 09/30/2020 2000   APPEARANCEUR HAZY (A) 09/30/2020 2000   LABSPEC 1.024 09/30/2020 2000   LABSPEC 1.025 04/03/2020 1209   PHURINE 7.0 09/30/2020 2000   GLUCOSEU NEGATIVE 09/30/2020 2000   HGBUR NEGATIVE 09/30/2020 2000   BILIRUBINUR NEGATIVE 09/30/2020 2000   BILIRUBINUR small (A) 04/03/2020 1209   BILIRUBINUR n 09/16/2016 1559   KETONESUR 20 (A) 09/30/2020 2000   PROTEINUR 30 (A) 09/30/2020 2000   UROBILINOGEN 0.2 09/16/2016 1559   UROBILINOGEN 2 (H) 05/23/2015 1252   NITRITE NEGATIVE 09/30/2020 2000   LEUKOCYTESUR NEGATIVE 09/30/2020 2000   Sepsis Labs Invalid input(s): PROCALCITONIN,  WBC,  LACTICIDVEN Microbiology Recent Results (from the past 240 hour(s))  Resp Panel by RT-PCR (Flu A&B, Covid) Nasopharyngeal Swab     Status: None   Collection Time: 10/01/20  4:08 AM   Specimen: Nasopharyngeal Swab; Nasopharyngeal(NP) swabs in vial transport medium  Result Value Ref Range Status   SARS  Coronavirus 2 by RT PCR NEGATIVE NEGATIVE Final    Comment: (NOTE) SARS-CoV-2 target nucleic acids are NOT DETECTED.  The SARS-CoV-2 RNA is generally detectable in upper respiratory specimens during the acute phase of infection. The lowest concentration of SARS-CoV-2 viral copies this assay can detect is 138 copies/mL. A negative result does not preclude SARS-Cov-2 infection and should not be used as the sole basis for treatment or other patient management decisions. A negative result may occur with  improper specimen collection/handling, submission of specimen other than nasopharyngeal swab, presence of viral mutation(s) within the areas targeted by this assay, and inadequate number of viral copies(<138 copies/mL). A negative result must be combined with clinical observations, patient history, and epidemiological information. The expected result is Negative.  Fact Sheet for Patients:  EntrepreneurPulse.com.au  Fact Sheet for Healthcare Providers:  IncredibleEmployment.be  This test is no t yet approved or cleared by the [place] FDA and  has been authorized for detection and/or diagnosis of SARS-CoV-2 by FDA under an Emergency Use Authorization (EUA). This EUA will remain  in effect (meaning this test can be used) for the duration of the COVID-19 declaration under Section 564(b)(1) of the Act, 21 U.S.C.section 360bbb-3(b)(1), unless the authorization is terminated  or revoked sooner.       Influenza A by PCR NEGATIVE NEGATIVE Final   Influenza B by PCR NEGATIVE NEGATIVE Final    Comment: (NOTE) The Xpert Xpress SARS-CoV-2/FLU/RSV plus assay is intended as an aid in the diagnosis of influenza from Nasopharyngeal swab specimens and should not be used as a sole basis for treatment. Nasal washings and aspirates are unacceptable for Xpert Xpress SARS-CoV-2/FLU/RSV testing.  Fact Sheet for  Patients: EntrepreneurPulse.com.au  Fact Sheet for Healthcare Providers: IncredibleEmployment.be  This test is not yet approved or cleared by the [place] FDA and has been authorized for detection and/or diagnosis of SARS-CoV-2 by FDA under an Emergency Use Authorization (EUA). This EUA will remain in effect (meaning this test can be used) for the duration of the COVID-19 declaration under Section 564(b)(1) of the Act, 21 U.S.C. section 360bbb-3(b)(1), unless the authorization is terminated or revoked.  Performed at [facility], [place] [address] [place], [place] [address]      Time coordinating discharge: 46mins  SIGNED:   Kathie Dike, MD  [facility] 10/03/2020, 11:27 PM   If 7PM-7AM, please contact night-coverage [email or web address]

## 2020-10-03 NOTE — Progress Notes (Signed)
   Subjective/Chief Complaint: Some flatus and bm, ng not removed despite order, off suction for some time doing great, no n/v, feels normal   Objective: Vital signs in last 24 hours: Temp:  [97.6 F (36.4 C)-98.3 F (36.8 C)] 98 F (36.7 C) (12/02 0454) Pulse Rate:  [60-66] 66 (12/02 0454) Resp:  [14-16] 16 (12/02 0454) BP: (136-141)/(73-78) 138/74 (12/02 0454) SpO2:  [97 %-100 %] 100 % (12/02 0454) Last BM Date: 10/02/20  Intake/Output from previous day: 12/01 0701 - 12/02 0700 In: 303.2 [I.V.:303.2] Out: 350 [Emesis/NG output:350] Intake/Output this shift: No intake/output data recorded.  GI: soft nt/nd  Lab Results:  Recent Labs    09/30/20 1335 10/02/20 0017  WBC 9.1 8.7  HGB 15.3* 14.6  HCT 46.3* 44.5  PLT 212 219   BMET Recent Labs    10/02/20 0017 10/03/20 0425  NA 144 145  K 3.6 3.7  CL 99 105  CO2 33* 28  GLUCOSE 144* 80  BUN 15 15  CREATININE 0.93 0.85  CALCIUM 9.7 9.4   PT/INR No results for input(s): LABPROT, INR in the last 72 hours. ABG No results for input(s): PHART, HCO3 in the last 72 hours.  Invalid input(s): PCO2, PO2  Studies/Results: DG Abd Portable 1V  Result Date: 10/02/2020 CLINICAL DATA:  Small bowel obstruction. EXAM: PORTABLE ABDOMEN - 1 VIEW COMPARISON:  October 01, 2020. FINDINGS: Small bowel dilatation noted on prior exam is significantly improved. Residual contrast and stool is seen in the colon. Phleboliths are noted in the pelvis. Nasogastric tube tip is seen in proximal stomach. IMPRESSION: Small bowel dilatation noted on prior exam is significantly improved. Electronically Signed   By: Marijo Conception M.D.   On: 10/02/2020 08:10   DG Abd Portable 1V-Small Bowel Obstruction Protocol-initial, 8 hr delay  Result Date: 10/01/2020 CLINICAL DATA:  Small-bowel obstruction EXAM: PORTABLE ABDOMEN - 1 VIEW COMPARISON:  10/01/2020 FINDINGS: Supine frontal view of the abdomen and pelvis excludes the hemidiaphragms by  collimation. Enteric catheter is seen within the gastric lumen. There is residual oral contrast within the stomach. Oral contrast is seen diffusing 3 dilated loops of small bowel throughout the central abdomen and pelvis. No definite contrast within the colon at this time. Excreted contrast within the bladder from previous CT. IMPRESSION: 1. Slow diffusion of oral contrast throughout dilated loops of small bowel. Findings are consistent with small-bowel obstruction. Electronically Signed   By: Randa Ngo M.D.   On: 10/01/2020 17:27    Anti-infectives: Anti-infectives (From admission, onward)   None      Assessment/Plan: SBO -ng removed, appears resolved, unlikely to need surgery now -fulls this am possibly home this afternoon -discussed possibility of happening again  Rolm Bookbinder 10/03/2020

## 2020-10-03 NOTE — Plan of Care (Signed)

## 2020-10-03 NOTE — Plan of Care (Signed)
  Problem: Education: Goal: Knowledge of General Education information will improve Description: Including pain rating scale, medication(s)/side effects and non-pharmacologic comfort measures 10/03/2020 1533 by Atilano Ina, RN Outcome: Adequate for Discharge 10/03/2020 1012 by Atilano Ina, RN Outcome: Progressing   Problem: Health Behavior/Discharge Planning: Goal: Ability to manage health-related needs will improve 10/03/2020 1533 by Atilano Ina, RN Outcome: Adequate for Discharge 10/03/2020 1012 by Atilano Ina, RN Outcome: Progressing   Problem: Clinical Measurements: Goal: Ability to maintain clinical measurements within normal limits will improve 10/03/2020 1533 by Atilano Ina, RN Outcome: Adequate for Discharge 10/03/2020 1012 by Atilano Ina, RN Outcome: Progressing Goal: Will remain free from infection 10/03/2020 1533 by Atilano Ina, RN Outcome: Adequate for Discharge 10/03/2020 1012 by Atilano Ina, RN Outcome: Progressing Goal: Diagnostic test results will improve 10/03/2020 1533 by Atilano Ina, RN Outcome: Adequate for Discharge 10/03/2020 1012 by Atilano Ina, RN Outcome: Progressing Goal: Respiratory complications will improve 10/03/2020 1533 by Atilano Ina, RN Outcome: Adequate for Discharge 10/03/2020 1012 by Atilano Ina, RN Outcome: Progressing Goal: Cardiovascular complication will be avoided 10/03/2020 1533 by Atilano Ina, RN Outcome: Adequate for Discharge 10/03/2020 1012 by Atilano Ina, RN Outcome: Progressing   Problem: Activity: Goal: Risk for activity intolerance will decrease 10/03/2020 1533 by Atilano Ina, RN Outcome: Adequate for Discharge 10/03/2020 1012 by Atilano Ina, RN Outcome: Progressing   Problem: Nutrition: Goal: Adequate nutrition will be maintained 10/03/2020 1533 by Atilano Ina, RN Outcome: Adequate for Discharge 10/03/2020 1012 by Atilano Ina, RN Outcome: Progressing   Problem: Coping: Goal: Level of anxiety  will decrease 10/03/2020 1533 by Atilano Ina, RN Outcome: Adequate for Discharge 10/03/2020 1012 by Atilano Ina, RN Outcome: Progressing   Problem: Elimination: Goal: Will not experience complications related to bowel motility 10/03/2020 1533 by Atilano Ina, RN Outcome: Adequate for Discharge 10/03/2020 1012 by Atilano Ina, RN Outcome: Progressing Goal: Will not experience complications related to urinary retention 10/03/2020 1533 by Atilano Ina, RN Outcome: Adequate for Discharge 10/03/2020 1012 by Atilano Ina, RN Outcome: Progressing   Problem: Pain Managment: Goal: General experience of comfort will improve 10/03/2020 1533 by Atilano Ina, RN Outcome: Adequate for Discharge 10/03/2020 1012 by Atilano Ina, RN Outcome: Progressing   Problem: Safety: Goal: Ability to remain free from injury will improve 10/03/2020 1533 by Atilano Ina, RN Outcome: Adequate for Discharge 10/03/2020 1012 by Atilano Ina, RN Outcome: Progressing   Problem: Skin Integrity: Goal: Risk for impaired skin integrity will decrease 10/03/2020 1533 by Atilano Ina, RN Outcome: Adequate for Discharge 10/03/2020 1012 by Atilano Ina, RN Outcome: Progressing

## 2020-10-07 DIAGNOSIS — H02836 Dermatochalasis of left eye, unspecified eyelid: Secondary | ICD-10-CM | POA: Diagnosis not present

## 2020-10-07 DIAGNOSIS — H16223 Keratoconjunctivitis sicca, not specified as Sjogren's, bilateral: Secondary | ICD-10-CM | POA: Diagnosis not present

## 2020-10-07 DIAGNOSIS — H02833 Dermatochalasis of right eye, unspecified eyelid: Secondary | ICD-10-CM | POA: Diagnosis not present

## 2020-10-07 DIAGNOSIS — Z9889 Other specified postprocedural states: Secondary | ICD-10-CM | POA: Diagnosis not present

## 2020-10-09 ENCOUNTER — Encounter: Payer: Self-pay | Admitting: Family Medicine

## 2020-10-09 ENCOUNTER — Other Ambulatory Visit: Payer: Self-pay

## 2020-10-09 ENCOUNTER — Ambulatory Visit: Payer: Federal, State, Local not specified - PPO | Admitting: Family Medicine

## 2020-10-09 VITALS — BP 110/80 | HR 65 | Ht 65.0 in | Wt 111.0 lb

## 2020-10-09 DIAGNOSIS — M999 Biomechanical lesion, unspecified: Secondary | ICD-10-CM

## 2020-10-09 DIAGNOSIS — R638 Other symptoms and signs concerning food and fluid intake: Secondary | ICD-10-CM

## 2020-10-09 DIAGNOSIS — G8929 Other chronic pain: Secondary | ICD-10-CM | POA: Diagnosis not present

## 2020-10-09 DIAGNOSIS — M545 Low back pain, unspecified: Secondary | ICD-10-CM

## 2020-10-09 DIAGNOSIS — M5136 Other intervertebral disc degeneration, lumbar region: Secondary | ICD-10-CM

## 2020-10-09 NOTE — Assessment & Plan Note (Signed)
Patient continues to have difficulty with weight loss.  Did recently have a small bowel obstruction and was seen in the emergency room with admission.  Patient has done better but continues to have trouble.  Encourage patient to follow-up with primary care provider or gastroenterology.

## 2020-10-09 NOTE — Progress Notes (Signed)
[facility] [address] [place] Phone: [phone] Subjective:   I Bethany [name] am serving as a Education administrator for Dr. Hulan Saas.  This visit occurred during the SARS-CoV-2 public health emergency.  Safety protocols were in place, including screening questions prior to the visit, additional usage of staff PPE, and extensive cleaning of exam room while observing appropriate contact time as indicated for disinfecting solutions.   I'm seeing this patient by the request  of:  Denita Lung, MD  CC: Low back pain follow-up  CHY:[id number]  Bethany [name] is a 64 y.o. female coming in with complaint of back pain. Last seen 08/30/2020 for OMT. Weekend before thanksgiving she bent down to pick something up when she felt something pop. States she could barely stand after that. Got a little better as time went on. Tried exercise which increased pain. States the pain lasted about 5 days and she explains that she has never felt pain this severe before. Minimal pain right now.  Patient unfortunately after taking opioids seems to have more of a small bowel obstruction and then was admitted to the hospital over Thanksgiving.  Patient since then has made progress and back is a little bit better at the moment.  Reviewed patient's notes from inpatient.   Patient did have recent x-rays taken September 24, 2020 showed that there was an unchanged grade 1 anterior listhesis at L4-L5 with sacroiliac joints unremarkable patient CT scan of the abdomen and pelvis did show that most severe arthritic changes was at L4-L5 with a grade 2 anterior listhesis  Past Medical History:  Diagnosis Date  . Anxiety   . Arthritis   . Cervical dysplasia 1990   CIN 2 subsequent cryosurgery. All Paps normal afterwards  . Dyslipidemia   . Herpes genitalis   . Herpes labialis   . Hypercholesteremia   . IBS (irritable bowel syndrome)   . Osteopenia 04/2014   T score -1.7 FRAX  7.3%/0.8% stable from prior DEXA 2013  . Raynaud disease   . SBO (small bowel obstruction) ([place] [facility]) 10/02/2020   Past Surgical History:  Procedure Laterality Date  . AUGMENTATION MAMMAPLASTY    . BREAST ENHANCEMENT SURGERY  2006  . COSMETIC SURGERY    . EYE SURGERY     lasik od  . GYNECOLOGIC CRYOSURGERY  1990  . LAPAROTOMY N/A 04/22/2018   Procedure: EXPLORATORY LAPAROTOMY;  Surgeon: Erroll Luna, MD;  Location: [facility];  Service: General;  Laterality: N/A;  . LIGAMENT REPAIR Right 09/24/2015   Procedure: RIGHT INDEX METACARPAL PHALANGEAL RADIAL COLLATERAL LIGAMENT REPAIR;  Surgeon: Leanora Cover, MD;  Location: [facility];  Service: Orthopedics;  Laterality: Right;  . MASS EXCISION N/A 08/04/2018   Procedure: REMOVAL OF SUTURE FROM ABDOMEN/ERAS PATHWAY;  Surgeon: Erroll Luna, MD;  Location: [facility];  Service: General;  Laterality: N/A;  . OOPHORECTOMY     BSO  . Right knee arthoscopy Right 09/09/2017   [facility]  . SHX 1 Left 02/19/2020   basel cell carcinoma  . SKIN BIOPSY Right 02/28/2019   Superficial basal carcinoma   . TONSILLECTOMY AND ADENOIDECTOMY    . TOTAL KNEE ARTHROPLASTY Right 04/12/2018  . TOTAL KNEE ARTHROPLASTY Right 04/12/2018   Procedure: RIGHT TOTAL KNEE ARTHROPLASTY;  Surgeon: Melrose Nakayama, MD;  Location: [facility];  Service: Orthopedics;  Laterality: Right;  . TUBAL LIGATION    . UPPER GASTROINTESTINAL ENDOSCOPY  04/24/05  . VAGINAL HYSTERECTOMY  2005  [facility] BSO  adenomyosis   Social History   Socioeconomic History  . Marital status: Married    Spouse name: Not on file  . Number of children: Not on file  . Years of education: Not on file  . Highest education level: Not on file  Occupational History  . Not on file  Tobacco Use  . Smoking status: Former Smoker    Quit date: 03/21/2006    Years since quitting: 14.5  . Smokeless tobacco: Never Used  Vaping Use  . Vaping Use: Never used  Substance and Sexual  Activity  . Alcohol use: No    Alcohol/week: 0.0 standard drinks    Comment: drinking in 1993  . Drug use: No  . Sexual activity: Yes    Birth control/protection: Surgical    Comment: HYST-1st intercourse 64 yo-More than 5 partners  Other Topics Concern  . Not on file  Social History Narrative  . Not on file   Social Determinants of Health   Financial Resource Strain:   . Difficulty of Paying Living Expenses: Not on file  Food Insecurity:   . Worried About Charity fundraiser in the Last Year: Not on file  . Ran Out of Food in the Last Year: Not on file  Transportation Needs:   . Lack of Transportation (Medical): Not on file  . Lack of Transportation (Non-Medical): Not on file  Physical Activity:   . Days of Exercise per Week: Not on file  . Minutes of Exercise per Session: Not on file  Stress:   . Feeling of Stress : Not on file  Social Connections:   . Frequency of Communication with Friends and Family: Not on file  . Frequency of Social Gatherings with Friends and Family: Not on file  . Attends Religious Services: Not on file  . Active Member of Clubs or Organizations: Not on file  . Attends Archivist Meetings: Not on file  . Marital Status: Not on file   Allergies  Allergen Reactions  . Morphine And Related Nausea Only   Family History  Problem Relation Age of Onset  . Hypertension Mother   . Cancer Father 45       Lymphoma  . Breast cancer Sister 58  . Colon cancer Neg Hx      Current Outpatient Medications (Cardiovascular):  .  simvastatin (ZOCOR) 20 MG tablet, Take 1 tablet (20 mg total) by mouth daily.  Current Outpatient Medications (Respiratory):  .  promethazine (PHENERGAN) 25 MG suppository, Place 1 suppository (25 mg total) rectally every 6 (six) hours as needed for nausea.  Current Outpatient Medications (Analgesics):  .  allopurinol (ZYLOPRIM) 100 MG tablet, Take 2 tablets (200 mg total) by mouth daily. Marland Kitchen  ibuprofen (ADVIL,MOTRIN)  800 MG tablet, Take 1 tablet (800 mg total) by mouth every 8 (eight) hours as needed. Marland Kitchen  oxyCODONE-acetaminophen (PERCOCET) 5-325 MG tablet, Take 1-2 tablets by mouth every 6 (six) hours as needed for severe pain.   Current Outpatient Medications (Other):  .  b complex vitamins tablet, Take 1 tablet by mouth daily. .  bisacodyl (DULCOLAX) 5 MG EC tablet, Take 1 tablet (5 mg total) by mouth daily as needed for moderate constipation. .  cholecalciferol (VITAMIN D) 1000 UNITS tablet, Take 1,000 Units by mouth daily. .  cycloSPORINE (RESTASIS) 0.05 % ophthalmic emulsion, INSTILL 1 DROP IN BOTH EYES TWICE DAILY .  docusate sodium (COLACE) 100 MG capsule, Take 1 capsule (100 mg total) by mouth 2 (  two) times daily. Marland Kitchen  gabapentin (NEURONTIN) 600 MG tablet, Take 1,200 mg by mouth 2 (two) times daily.  Marland Kitchen  lamoTRIgine (LAMICTAL) 100 MG tablet, Take 100 mg by mouth 2 (two) times daily.  Marland Kitchen  LOTEMAX SM 0.38 % GEL, Apply 1 drop to eye 4 (four) times daily.  .  methocarbamol (ROBAXIN) 500 MG tablet, Take 1 tablet (500 mg total) by mouth every 6 (six) hours as needed for muscle spasms. .  polyethylene glycol (MIRALAX) 17 g packet, Take 17 g by mouth daily. Marland Kitchen  PROCTOZONE-HC 2.5 % rectal cream, apply rectally twice a day (Patient taking differently: Apply rectally twice a day as needed for irritations) .  traZODone (DESYREL) 50 MG tablet, Take 50 mg by mouth at bedtime. .  vitamin C (ASCORBIC ACID) 500 MG tablet, Take 500 mg by mouth daily. .  Vitamin D, Ergocalciferol, (DRISDOL) 1.25 MG (50000 UNIT) CAPS capsule, TAKE 1 CAPSULE BY MOUTH EVERY 7 DAYS   Reviewed prior external information including notes and imaging from  primary care provider As well as notes that were available from care everywhere and other healthcare systems.  Past medical history, social, surgical and family history all reviewed in electronic medical record.  No pertanent information unless stated regarding to the chief complaint.    Review of Systems:  No headache, visual changes, nausea, vomiting, diarrhea, constipation, dizziness, skin rash, fevers, chills, night sweats, weight loss, swollen lymph nodes, , joint swelling, chest pain, shortness of breath, mood changes. POSITIVE muscle aches, body aches, mild abdominal pain  Objective  Blood pressure 110/80, pulse 65, height 5\' 5"  (1.651 m), weight 111 lb (50.3 kg), SpO2 91 %.   General: No apparent distress alert and oriented x3 mood and affect normal, dressed appropriately.  Patient does appear cachectic HEENT: Pupils equal, extraocular movements intact  Respiratory: Patient's speak in full sentences and does not appear short of breath  Cardiovascular: No lower extremity edema, non tender, no erythema  Neuro: Cranial nerves II through XII are intact, neurovascularly intact in all extremities with 2+ DTRs and 2+ pulses.  Gait normal with good balance and coordination.  MSK: Low back exam does have some degenerative scoliosis noted.  Does have loss of lordosis.  Patient still has fairly poor muscle mass at the moment.  Patient does have mild limited range of motion in flexion extension as well as side bending.  Tightness noted minorly with FABER test on the right.  Mild pain still right greater than left of the paraspinal musculature lumbar spine. Straight leg test does show significant tightness with mild potential radicular symptoms on the right side.  Neurovascularly intact distally.  Osteopathic findings T8 extended rotated and side bent left L2 flexed rotated and side bent right L5 flexed rotated and side bent right Sacrum left on left   Impression and Recommendations:     The above documentation has been reviewed and is accurate and complete Lyndal Pulley, DO

## 2020-10-09 NOTE — Assessment & Plan Note (Signed)
Known degenerative disc disease with spondylolisthesis.  Patient has responded previously to epidurals but multiple years ago.  We will do feel MRI is necessary.  (To rule out any other abnormality that could be contributing with potential weight loss as well.  Depending on findings we will discuss different medical management.  Follow-up with me again in 6 weeks

## 2020-10-09 NOTE — Patient Instructions (Signed)
Good to see you Increase activity as tolerated Eat whatever you want  Will get MRI of back See me again in 4-5 weeks if continued weight loss please check with PCP or GI

## 2020-10-15 DIAGNOSIS — F1123 Opioid dependence with withdrawal: Secondary | ICD-10-CM | POA: Diagnosis not present

## 2020-10-15 DIAGNOSIS — F1120 Opioid dependence, uncomplicated: Secondary | ICD-10-CM | POA: Diagnosis not present

## 2020-10-15 DIAGNOSIS — F1521 Other stimulant dependence, in remission: Secondary | ICD-10-CM | POA: Diagnosis not present

## 2020-10-15 DIAGNOSIS — F1421 Cocaine dependence, in remission: Secondary | ICD-10-CM | POA: Diagnosis not present

## 2020-10-16 DIAGNOSIS — Z1231 Encounter for screening mammogram for malignant neoplasm of breast: Secondary | ICD-10-CM | POA: Diagnosis not present

## 2020-10-16 DIAGNOSIS — Z803 Family history of malignant neoplasm of breast: Secondary | ICD-10-CM | POA: Diagnosis not present

## 2020-10-16 LAB — HM MAMMOGRAPHY

## 2020-10-17 DIAGNOSIS — F1120 Opioid dependence, uncomplicated: Secondary | ICD-10-CM | POA: Diagnosis not present

## 2020-10-17 DIAGNOSIS — F1521 Other stimulant dependence, in remission: Secondary | ICD-10-CM | POA: Diagnosis not present

## 2020-10-17 DIAGNOSIS — F411 Generalized anxiety disorder: Secondary | ICD-10-CM | POA: Diagnosis not present

## 2020-10-17 DIAGNOSIS — F1421 Cocaine dependence, in remission: Secondary | ICD-10-CM | POA: Diagnosis not present

## 2020-10-18 ENCOUNTER — Encounter: Payer: Self-pay | Admitting: Family Medicine

## 2020-10-18 ENCOUNTER — Ambulatory Visit: Payer: Federal, State, Local not specified - PPO | Admitting: Family Medicine

## 2020-10-18 ENCOUNTER — Other Ambulatory Visit: Payer: Self-pay

## 2020-10-18 VITALS — BP 120/80 | HR 69 | Temp 96.9°F | Wt 110.8 lb

## 2020-10-18 VITALS — BP 120/80 | Temp 97.4°F | Wt 110.0 lb

## 2020-10-18 DIAGNOSIS — Z713 Dietary counseling and surveillance: Secondary | ICD-10-CM

## 2020-10-18 DIAGNOSIS — F39 Unspecified mood [affective] disorder: Secondary | ICD-10-CM | POA: Diagnosis not present

## 2020-10-18 DIAGNOSIS — Z8719 Personal history of other diseases of the digestive system: Secondary | ICD-10-CM

## 2020-10-18 DIAGNOSIS — Z9189 Other specified personal risk factors, not elsewhere classified: Secondary | ICD-10-CM

## 2020-10-18 DIAGNOSIS — F1021 Alcohol dependence, in remission: Secondary | ICD-10-CM | POA: Insufficient documentation

## 2020-10-18 NOTE — Progress Notes (Signed)
   Subjective:    Patient ID: Bethany [name], female    DOB: 1956/08/19, 64 y.o.   MRN: [id number]  HPI    Review of Systems     Objective:   Physical Exam        Assessment & Plan:

## 2020-10-18 NOTE — Progress Notes (Signed)
   Subjective:    Patient ID: Bethany [name], female    DOB: [birth year], 64 y.o.   MRN: [id number]  HPI She is here for follow-up after recent hospitalization for evaluation and treatment of a small bowel obstruction.  She was admitted November 29 and sent home once December 2 she also admits to having difficulty with opiate abuse.  She admits to taking the medication and not really having any appreciable back pain with this.  She recognizes that this has a problem and has gotten back in [place] and has good support.  She states that her home life is also very supportive.  She is making an effort to increase her food intake by eating more frequent but smaller meals but does admit to having difficulty with not being hungry.  She is starting to exercise but overdo this.  She would like for her psychiatrist, Dr. Edgar Frisk and I to be able to communicate together.  She will sign forms for that.   Review of Systems     Objective:   Physical Exam Alert and in no distress. Tympanic membranes and canals are normal. Pharyngeal area is normal. Neck is supple without adenopathy or thyromegaly. Cardiac exam shows a regular sinus rhythm without murmurs or gallops. Lungs are clear to auscultation. Abdominal exam shows no masses or tenderness. Hospital record including discharge summary, lab data and x-rays was reviewed.       Assessment & Plan:  History of small bowel obstruction - Plan: CBC with Differential/Platelet, Comprehensive metabolic panel  Recovering alcoholic in remission ([facility])  Mood disorder ([facility])  Weight loss counseling, encounter for  At risk for abuse of opiates Encouraged her to continue with her AA meetings as well as support from various other individuals which she plans to continue to do.  She will continue on her present psychotropic medications and follow-up with Dr. Edgar Frisk.  Follow-up pending blood work.

## 2020-10-19 LAB — CBC WITH DIFFERENTIAL/PLATELET
Basophils Absolute: 0.1 10*3/uL (ref 0.0–0.2)
Basos: 1 %
EOS (ABSOLUTE): 0.1 10*3/uL (ref 0.0–0.4)
Eos: 1 %
Hematocrit: 38.7 % (ref 34.0–46.6)
Hemoglobin: 13.7 g/dL (ref 11.1–15.9)
Immature Grans (Abs): 0 10*3/uL (ref 0.0–0.1)
Immature Granulocytes: 0 %
Lymphocytes Absolute: 1.6 10*3/uL (ref 0.7–3.1)
Lymphs: 28 %
MCH: 34.2 pg — ABNORMAL HIGH (ref 26.6–33.0)
MCHC: 35.4 g/dL (ref 31.5–35.7)
MCV: 97 fL (ref 79–97)
Monocytes Absolute: 0.4 10*3/uL (ref 0.1–0.9)
Monocytes: 8 %
Neutrophils Absolute: 3.5 10*3/uL (ref 1.4–7.0)
Neutrophils: 62 %
Platelets: 206 10*3/uL (ref 150–450)
RBC: 4.01 x10E6/uL (ref 3.77–5.28)
RDW: 11.8 % (ref 11.7–15.4)
WBC: 5.6 10*3/uL (ref 3.4–10.8)

## 2020-10-19 LAB — COMPREHENSIVE METABOLIC PANEL
ALT: 20 IU/L (ref 0–32)
AST: 30 IU/L (ref 0–40)
Albumin/Globulin Ratio: 2.5 — ABNORMAL HIGH (ref 1.2–2.2)
Albumin: 5 g/dL — ABNORMAL HIGH (ref 3.8–4.8)
Alkaline Phosphatase: 77 IU/L (ref 44–121)
BUN/Creatinine Ratio: 16 (ref 12–28)
BUN: 14 mg/dL (ref 8–27)
Bilirubin Total: 0.3 mg/dL (ref 0.0–1.2)
CO2: 27 mmol/L (ref 20–29)
Calcium: 10.1 mg/dL (ref 8.7–10.3)
Chloride: 103 mmol/L (ref 96–106)
Creatinine, Ser: 0.86 mg/dL (ref 0.57–1.00)
GFR calc Af Amer: 83 mL/min/{1.73_m2} (ref >59)
GFR calc non Af Amer: 72 mL/min/{1.73_m2} (ref >59)
Globulin, Total: 2 g/dL (ref 1.5–4.5)
Glucose: 76 mg/dL (ref 65–99)
Potassium: 5.5 mmol/L — ABNORMAL HIGH (ref 3.5–5.2)
Sodium: 143 mmol/L (ref 134–144)
Total Protein: 7 g/dL (ref 6.0–8.5)

## 2020-10-21 DIAGNOSIS — H16223 Keratoconjunctivitis sicca, not specified as Sjogren's, bilateral: Secondary | ICD-10-CM | POA: Diagnosis not present

## 2020-10-21 DIAGNOSIS — H02833 Dermatochalasis of right eye, unspecified eyelid: Secondary | ICD-10-CM | POA: Diagnosis not present

## 2020-10-21 DIAGNOSIS — H02836 Dermatochalasis of left eye, unspecified eyelid: Secondary | ICD-10-CM | POA: Diagnosis not present

## 2020-10-24 DIAGNOSIS — F1120 Opioid dependence, uncomplicated: Secondary | ICD-10-CM | POA: Diagnosis not present

## 2020-10-24 DIAGNOSIS — F1521 Other stimulant dependence, in remission: Secondary | ICD-10-CM | POA: Diagnosis not present

## 2020-10-24 DIAGNOSIS — F1421 Cocaine dependence, in remission: Secondary | ICD-10-CM | POA: Diagnosis not present

## 2020-10-24 DIAGNOSIS — F411 Generalized anxiety disorder: Secondary | ICD-10-CM | POA: Diagnosis not present

## 2020-10-28 ENCOUNTER — Other Ambulatory Visit: Payer: Self-pay | Admitting: Family Medicine

## 2020-10-28 DIAGNOSIS — E785 Hyperlipidemia, unspecified: Secondary | ICD-10-CM

## 2020-10-30 ENCOUNTER — Ambulatory Visit
Admission: RE | Admit: 2020-10-30 | Discharge: 2020-10-30 | Disposition: A | Discharge status: Home / Self Care | Payer: Federal, State, Local not specified - PPO | Source: Ambulatory Visit | Attending: Family Medicine | Admitting: Family Medicine

## 2020-10-30 ENCOUNTER — Other Ambulatory Visit: Payer: Self-pay

## 2020-10-30 DIAGNOSIS — M545 Low back pain, unspecified: Secondary | ICD-10-CM | POA: Diagnosis not present

## 2020-10-30 DIAGNOSIS — G8929 Other chronic pain: Secondary | ICD-10-CM

## 2020-10-30 DIAGNOSIS — M48061 Spinal stenosis, lumbar region without neurogenic claudication: Secondary | ICD-10-CM | POA: Diagnosis not present

## 2020-10-31 ENCOUNTER — Encounter: Payer: Self-pay | Admitting: Family Medicine

## 2020-10-31 ENCOUNTER — Other Ambulatory Visit: Payer: Self-pay

## 2020-10-31 DIAGNOSIS — M5416 Radiculopathy, lumbar region: Secondary | ICD-10-CM

## 2020-10-31 DIAGNOSIS — F1421 Cocaine dependence, in remission: Secondary | ICD-10-CM | POA: Diagnosis not present

## 2020-10-31 DIAGNOSIS — F1521 Other stimulant dependence, in remission: Secondary | ICD-10-CM | POA: Diagnosis not present

## 2020-10-31 DIAGNOSIS — F411 Generalized anxiety disorder: Secondary | ICD-10-CM | POA: Diagnosis not present

## 2020-10-31 DIAGNOSIS — F1120 Opioid dependence, uncomplicated: Secondary | ICD-10-CM | POA: Diagnosis not present

## 2020-11-06 ENCOUNTER — Other Ambulatory Visit: Payer: Self-pay

## 2020-11-06 ENCOUNTER — Ambulatory Visit
Admission: RE | Admit: 2020-11-06 | Discharge: 2020-11-06 | Disposition: A | Discharge status: Home / Self Care | Payer: Federal, State, Local not specified - PPO | Source: Ambulatory Visit | Attending: Family Medicine | Admitting: Family Medicine

## 2020-11-06 DIAGNOSIS — M5416 Radiculopathy, lumbar region: Secondary | ICD-10-CM

## 2020-11-06 DIAGNOSIS — F1421 Cocaine dependence, in remission: Secondary | ICD-10-CM | POA: Diagnosis not present

## 2020-11-06 DIAGNOSIS — F1120 Opioid dependence, uncomplicated: Secondary | ICD-10-CM | POA: Diagnosis not present

## 2020-11-06 DIAGNOSIS — F411 Generalized anxiety disorder: Secondary | ICD-10-CM | POA: Diagnosis not present

## 2020-11-06 DIAGNOSIS — M48061 Spinal stenosis, lumbar region without neurogenic claudication: Secondary | ICD-10-CM | POA: Diagnosis not present

## 2020-11-06 DIAGNOSIS — F1521 Other stimulant dependence, in remission: Secondary | ICD-10-CM | POA: Diagnosis not present

## 2020-11-06 MED ORDER — METHYLPREDNISOLONE ACETATE 40 MG/ML INJ SUSP (RADIOLOG
120.0000 mg | Freq: Once | INTRAMUSCULAR | Status: AC
  Administered 2020-11-06: 120 mg via EPIDURAL

## 2020-11-06 MED ORDER — IOPAMIDOL (ISOVUE-M 200) INJECTION 41%
1.0000 mL | Freq: Once | INTRAMUSCULAR | Status: AC
  Administered 2020-11-06: 1 mL via EPIDURAL

## 2020-11-06 NOTE — Discharge Instructions (Signed)

## 2020-11-07 ENCOUNTER — Ambulatory Visit: Payer: Federal, State, Local not specified - PPO | Admitting: Family Medicine

## 2020-11-13 DIAGNOSIS — F1521 Other stimulant dependence, in remission: Secondary | ICD-10-CM | POA: Diagnosis not present

## 2020-11-13 DIAGNOSIS — F1421 Cocaine dependence, in remission: Secondary | ICD-10-CM | POA: Diagnosis not present

## 2020-11-13 DIAGNOSIS — F502 Bulimia nervosa: Secondary | ICD-10-CM | POA: Diagnosis not present

## 2020-11-13 DIAGNOSIS — F1120 Opioid dependence, uncomplicated: Secondary | ICD-10-CM | POA: Diagnosis not present

## 2020-11-20 DIAGNOSIS — F1421 Cocaine dependence, in remission: Secondary | ICD-10-CM | POA: Diagnosis not present

## 2020-11-20 DIAGNOSIS — F411 Generalized anxiety disorder: Secondary | ICD-10-CM | POA: Diagnosis not present

## 2020-11-20 DIAGNOSIS — F1521 Other stimulant dependence, in remission: Secondary | ICD-10-CM | POA: Diagnosis not present

## 2020-11-20 DIAGNOSIS — F1121 Opioid dependence, in remission: Secondary | ICD-10-CM | POA: Diagnosis not present

## 2020-11-27 NOTE — Progress Notes (Unsigned)
[facility] [place] Phone: [phone] Subjective:   Bethany Bethany [name], am serving as a scribe for Dr. Hulan Saas. This visit occurred during the SARS-CoV-2 public health emergency.  Safety protocols were in place, including screening questions prior to the visit, additional usage of staff PPE, and extensive cleaning of exam room while observing appropriate contact time as indicated for disinfecting solutions.   I'm seeing this patient by the request  of:  Denita Lung, MD  CC: Back and neck pain follow-up  [id number]  Bethany Bethany [name] is a 65 y.o. female coming in with complaint of back and neck pain. OMT 10/09/2020. Patient states that she had epidural on 11/06/2020. Bethany [name] relief at all from epidural. Pain can be sharp but is mostly achy on right side.  Patient states that she is always under pain.  Patient's most recent MRI did show the patient does have severe spinal stenosis at the L4-L5 with foraminal narrowing.  Medications patient has been prescribed: Vit D          Reviewed prior external information including notes and imaging from previsou exam, outside providers and external EMR if available.   As well as notes that were available from care everywhere and other healthcare systems.  Past medical history, social, surgical and family history all reviewed in electronic medical record.  Bethany [name]   Past Medical History:  Diagnosis Date  . Anxiety   . Arthritis   . Cervical dysplasia 1990   CIN 2 subsequent cryosurgery. All Paps normal afterwards  . Dyslipidemia   . Herpes genitalis   . Herpes labialis   . Hypercholesteremia   . IBS (irritable bowel syndrome)   . Osteopenia 04/2014   T score -1.7 FRAX 7.3%/0.8% stable from prior DEXA 2013  . Raynaud disease   . SBO (small bowel obstruction) ([place]) 10/02/2020    Allergies  Allergen Reactions   . Morphine And Related Nausea Only     Review of Systems:  Bethany [name], visual changes, nausea, vomiting, diarrhea, constipation, dizziness, abdominal pain, skin rash, fevers, chills, night sweats, weight loss, swollen lymph nodes, body aches, joint swelling, chest pain, shortness of breath, mood changes. POSITIVE muscle aches  Objective  Blood pressure 110/72, height 5\' 5"  (1.651 m), weight 116 lb (52.6 kg).   General: Bethany [name], dressed appropriately.  HEENT: Pupils equal, extraocular movements intact  Respiratory: Patient's speak in full sentences and does not appear short of breath  Cardiovascular: Bethany [name], non tender, Bethany [name]  Neuro: Cranial nerves II through XII are intact, neurovascularly intact in all extremities with 2+ DTRs and 2+ pulses.  Gait normal with good balance and coordination.  MSK:  Non tender with full range of motion and good stability and symmetric strength and tone of shoulders, elbows, wrist, hip, knee and ankles bilaterally.  Back -low back exam does have significant loss of lordosis.  Tenderness to palpation in the paraspinal musculature of the lumbar spine right greater than left.  Continued tightness of the hip flexor on the right greater than left.  Negative straight leg test but worsening pain with extension of the back.  Osteopathic findings   T9 extended rotated and side bent left L2 flexed rotated and side bent right Sacrum right on right       Assessment and Plan:  Degenerative disc disease,  lumbar Degenerative disc disease with severe spinal stenosis at the L4-L5.  Patient did not respond well to the epidural though.  Patient continues to have pain but does not want to make significant other changes.  Continuing to take gabapentin at a very high dose.  Patient has had difficulty with alcohol and narcotics previously so this is not a possibility.  We will get vitamin D  level to see if we should continue the vitamin D supplementation.  Bone density only showed osteopenia.  This was done in 2021.  Good muscle energy of the lower spine today.  We will continue with conservative therapy and start with formal physical therapy that I think will be beneficial for the individual to increase her core stability and strengthening again since patient's previous hospitalization.  Patient will follow up with me again 6 to 8 weeks.    Nonallopathic problems  Decision today to treat with OMT was based on Physical Exam  After verbal consent patient was treated with HVLA, ME, FPR techniques in thoracic, lumbar, and sacral  areas  Patient tolerated the procedure well with improvement in symptoms  Patient given exercises, stretches and lifestyle modifications  See medications in patient instructions if given  Patient will follow up in 4-8 weeks      The above documentation has been reviewed and is accurate and complete Bethany Pulley, DO       Note: This dictation was prepared with Dragon dictation along with smaller phrase technology. Any transcriptional errors that result from this process are unintentional.

## 2020-11-28 ENCOUNTER — Encounter: Payer: Self-pay | Admitting: Family Medicine

## 2020-11-28 ENCOUNTER — Other Ambulatory Visit: Payer: Self-pay

## 2020-11-28 ENCOUNTER — Telehealth: Payer: Self-pay | Admitting: *Deleted

## 2020-11-28 ENCOUNTER — Ambulatory Visit: Payer: Federal, State, Local not specified - PPO | Admitting: Family Medicine

## 2020-11-28 VITALS — BP 110/72 | Ht 65.0 in | Wt 116.0 lb

## 2020-11-28 DIAGNOSIS — M2550 Pain in unspecified joint: Secondary | ICD-10-CM | POA: Diagnosis not present

## 2020-11-28 DIAGNOSIS — M999 Biomechanical lesion, unspecified: Secondary | ICD-10-CM | POA: Diagnosis not present

## 2020-11-28 DIAGNOSIS — M5136 Other intervertebral disc degeneration, lumbar region: Secondary | ICD-10-CM | POA: Diagnosis not present

## 2020-11-28 DIAGNOSIS — M48061 Spinal stenosis, lumbar region without neurogenic claudication: Secondary | ICD-10-CM | POA: Diagnosis not present

## 2020-11-28 LAB — MAGNESIUM: Magnesium: 2.2 mg/dL (ref 1.5–2.5)

## 2020-11-28 LAB — FERRITIN: Ferritin: 23.4 ng/mL (ref 10.0–291.0)

## 2020-11-28 LAB — SEDIMENTATION RATE: Sed Rate: 14 mm/hr (ref 0–30)

## 2020-11-28 LAB — VITAMIN D 25 HYDROXY (VIT D DEFICIENCY, FRACTURES): VITD: 107.37 ng/mL (ref 30.00–100.00)

## 2020-11-28 NOTE — Assessment & Plan Note (Signed)
Degenerative disc disease with severe spinal stenosis at the L4-L5.  Patient did not respond well to the epidural though.  Patient continues to have pain but does not want to make significant other changes.  Continuing to take gabapentin at a very high dose.  Patient has had difficulty with alcohol and narcotics previously so this is not a possibility.  We will get vitamin D level to see if we should continue the vitamin D supplementation.  Bone density only showed osteopenia.  This was done in 2021.  Good muscle energy of the lower spine today.  We will continue with conservative therapy and start with formal physical therapy that I think will be beneficial for the individual to increase her core stability and strengthening again since patient's previous hospitalization.  Patient will follow up with me again 6 to 8 weeks.

## 2020-11-28 NOTE — Patient Instructions (Signed)
Labs today PT [facility] will call you See me in 6-8 weeks

## 2020-11-28 NOTE — Telephone Encounter (Signed)
Patient's Vit D is critical at 107.37.

## 2020-11-28 NOTE — Telephone Encounter (Signed)
Noted, wrote her in my chart

## 2020-12-02 LAB — PTH, INTACT AND CALCIUM
Calcium: 10.3 mg/dL (ref 8.6–10.4)
PTH: 21 pg/mL (ref 14–64)

## 2020-12-02 LAB — CALCIUM, IONIZED: Calcium, Ion: 5.07 mg/dL (ref 4.8–5.6)

## 2020-12-04 DIAGNOSIS — F1421 Cocaine dependence, in remission: Secondary | ICD-10-CM | POA: Diagnosis not present

## 2020-12-04 DIAGNOSIS — F1521 Other stimulant dependence, in remission: Secondary | ICD-10-CM | POA: Diagnosis not present

## 2020-12-04 DIAGNOSIS — F411 Generalized anxiety disorder: Secondary | ICD-10-CM | POA: Diagnosis not present

## 2020-12-04 DIAGNOSIS — F1120 Opioid dependence, uncomplicated: Secondary | ICD-10-CM | POA: Diagnosis not present

## 2020-12-05 DIAGNOSIS — L905 Scar conditions and fibrosis of skin: Secondary | ICD-10-CM | POA: Diagnosis not present

## 2020-12-05 DIAGNOSIS — C44519 Basal cell carcinoma of skin of other part of trunk: Secondary | ICD-10-CM | POA: Diagnosis not present

## 2020-12-05 DIAGNOSIS — L821 Other seborrheic keratosis: Secondary | ICD-10-CM | POA: Diagnosis not present

## 2020-12-05 DIAGNOSIS — Z85828 Personal history of other malignant neoplasm of skin: Secondary | ICD-10-CM | POA: Diagnosis not present

## 2020-12-10 ENCOUNTER — Other Ambulatory Visit: Payer: Self-pay

## 2020-12-10 ENCOUNTER — Encounter: Payer: Self-pay | Admitting: Family Medicine

## 2020-12-10 ENCOUNTER — Ambulatory Visit: Payer: Federal, State, Local not specified - PPO | Admitting: Family Medicine

## 2020-12-10 VITALS — BP 110/70 | HR 67 | Temp 96.5°F | Wt 117.6 lb

## 2020-12-10 DIAGNOSIS — D171 Benign lipomatous neoplasm of skin and subcutaneous tissue of trunk: Secondary | ICD-10-CM

## 2020-12-10 DIAGNOSIS — Z803 Family history of malignant neoplasm of breast: Secondary | ICD-10-CM | POA: Diagnosis not present

## 2020-12-10 DIAGNOSIS — Z23 Encounter for immunization: Secondary | ICD-10-CM | POA: Diagnosis not present

## 2020-12-10 NOTE — Progress Notes (Signed)
   Subjective:    Patient ID: Bethany [name], female    DOB: 1956-08-29, 65 y.o.   MRN: [id number]  HPI She is here for consult concerning a lesion in the right axillary area.  She also has concerns over recent mammogram.  She does have a sister that has had breast cancer but apparently no other family members have had that nor is there history of ovarian cancer, prostate cancer.   Review of Systems     Objective:   Physical Exam Exam of the right lower axillary area does show a 1 cm round smooth movable lesion.       Assessment & Plan:  Lipoma of torso  Family history of breast cancer in sister - Plan: BRCAssure Comprehensive Panel  Need for pneumococcal vaccination - Plan: Pneumococcal conjugate vaccine 13-valent IM The BRCA was not ordered as she probably does not qualify.  She will check with her insurance. Explained that the lipoma is of no great concern. Her immunizations were updated. Discussed follow-up mammogram with her on a yearly basis and the possibility of doing CT mammogram because of her dense breasts.   I explained that the lipoma is of no great concern.

## 2020-12-11 HISTORY — PX: OTHER SURGICAL HISTORY: SHX169

## 2020-12-17 ENCOUNTER — Other Ambulatory Visit: Payer: Self-pay | Admitting: Internal Medicine

## 2020-12-21 IMAGING — DX DG HAND LIMITED 1V
1 series · 2 of 2 positions shown · non-contrast
Comparison: None.

CLINICAL DATA: Bilateral thumb pain.

EXAM:
HAND LIMITED 1 VIEW

[Series 1: hand ap · 0.14mm/px · 2 of 2 slices shown]
[im 1/2]
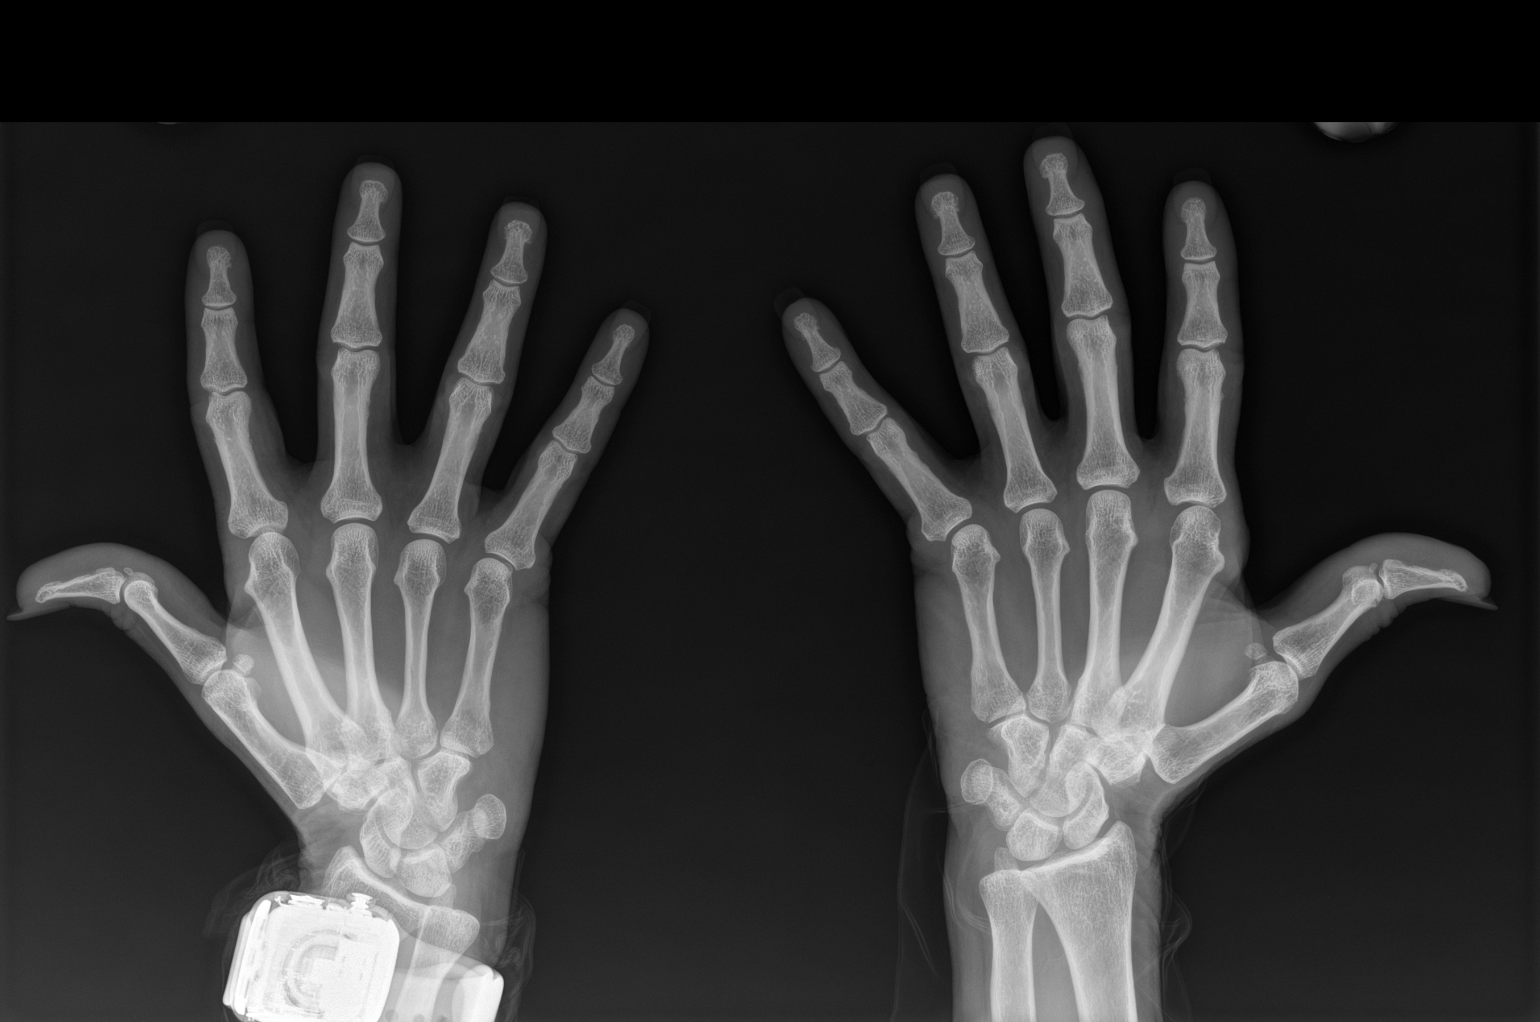
[im 2/2]
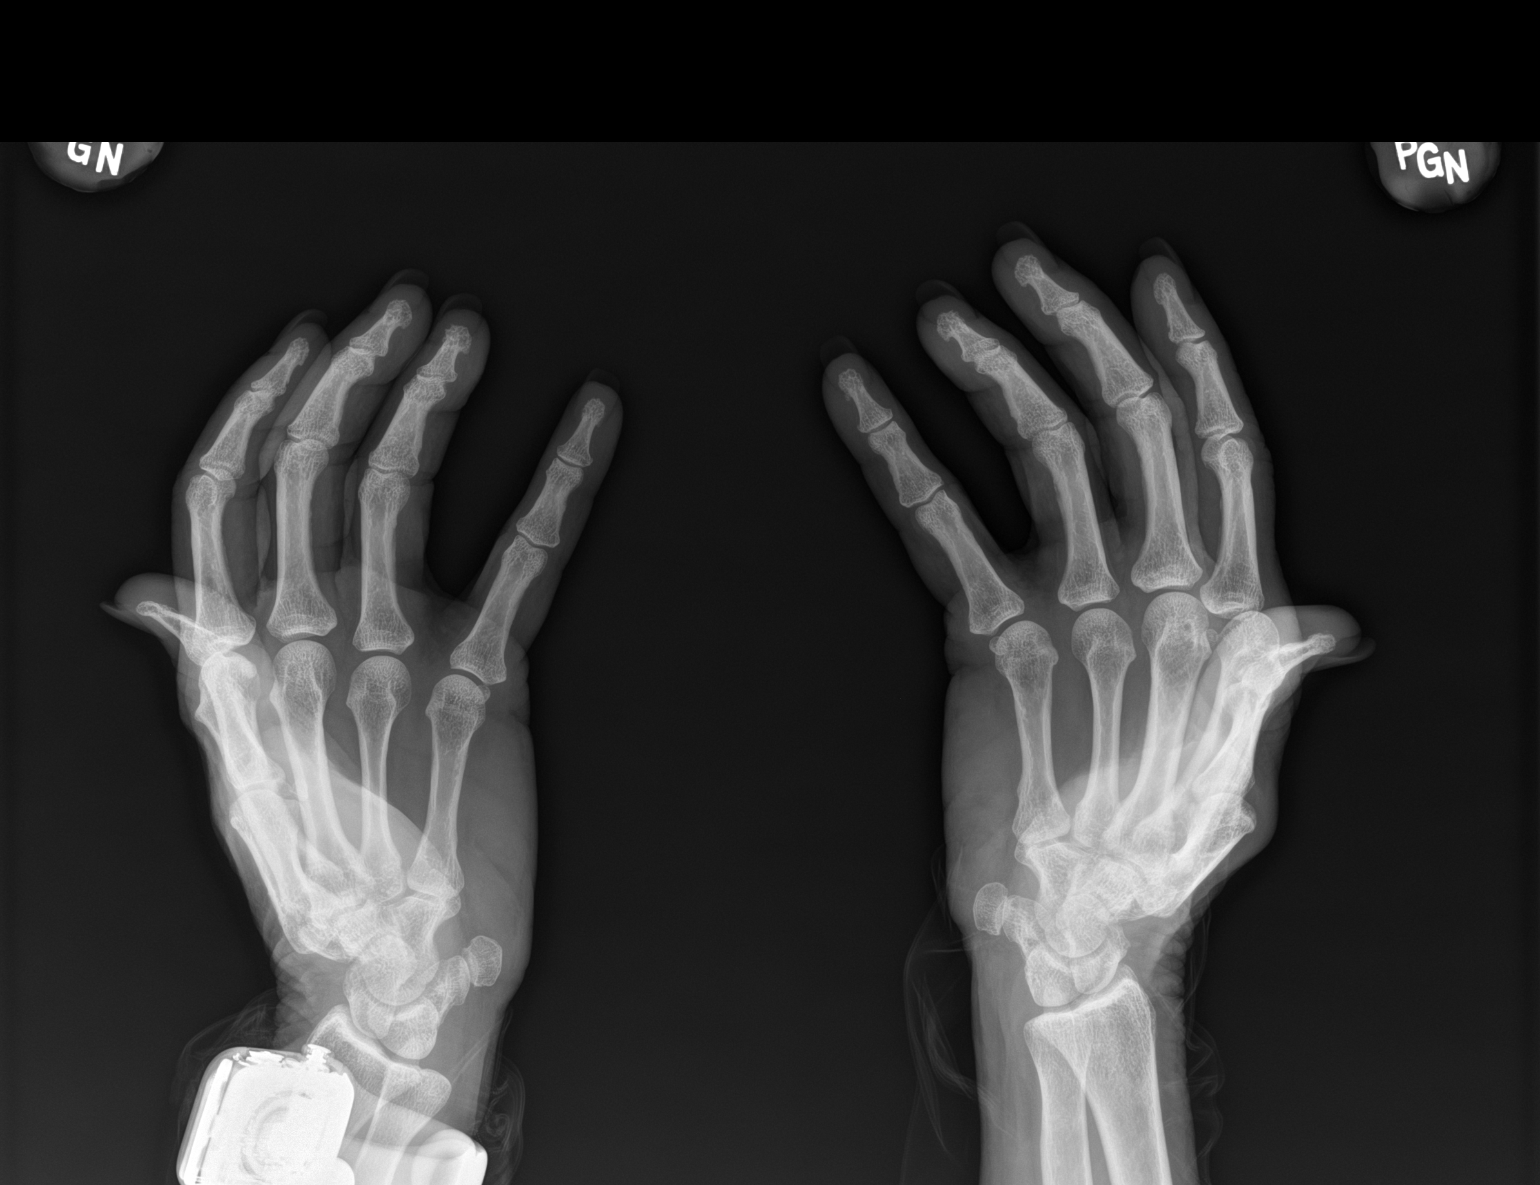

[2 of 2 positions shown; findings below may reference images not displayed]

FINDINGS: There is no evidence of fracture or dislocation. There is no
evidence of arthropathy or other focal bone abnormality. Soft
tissues are unremarkable.
IMPRESSION: Negative.

## 2020-12-25 DIAGNOSIS — F1521 Other stimulant dependence, in remission: Secondary | ICD-10-CM | POA: Diagnosis not present

## 2020-12-25 DIAGNOSIS — F1121 Opioid dependence, in remission: Secondary | ICD-10-CM | POA: Diagnosis not present

## 2020-12-25 DIAGNOSIS — F411 Generalized anxiety disorder: Secondary | ICD-10-CM | POA: Diagnosis not present

## 2020-12-25 DIAGNOSIS — F1421 Cocaine dependence, in remission: Secondary | ICD-10-CM | POA: Diagnosis not present

## 2021-01-02 ENCOUNTER — Other Ambulatory Visit: Payer: Self-pay | Admitting: Family Medicine

## 2021-01-02 DIAGNOSIS — E785 Hyperlipidemia, unspecified: Secondary | ICD-10-CM

## 2021-01-06 NOTE — Progress Notes (Signed)
[place] Phone: [phone] Subjective:   [name], am serving as a scribe for Dr. Hulan Saas. This visit occurred during the SARS-CoV-2 public health emergency.  Safety protocols were in place, including screening questions prior to the visit, additional usage of staff PPE, and extensive cleaning of exam room while observing appropriate contact time as indicated for disinfecting solutions.   I'm seeing this patient by the request  of:  Bethany Lung, MD  CC: Back pain follow-up  [id number]  Bethany [name] is a 65 y.o. female coming in with complaint of back and neck pain. OMT 11/28/2020. Patient states that she has constant aching.  Patient states that on a daily basis is never without pain.  Taking ibuprofen once to twice a day.  Patient continues with the gabapentin and trazodone.  Patient states that unfortunately it still unrelenting pain.  Does feel manipulation gives her some mild relief.  Continuing to try to be active where patient has played pickle ball recently as well as was able to go for a short run her dog for the first time in a long time.  Medications patient has been prescribed: None prescribed by me      Patient has an MRI of the spine showing the patient does have degenerative anterior listhesis of L4-L5 with moderate to severe spinal stenosis.  A This was independently visualized by me today.   Reviewed prior external information including notes and imaging from previsou exam, outside providers and external EMR if available.   As well as notes that were available from care everywhere and other healthcare systems.  Past medical history, social, surgical and family history all reviewed in electronic medical record.  No pertanent information unless stated regarding to the chief complaint.   Past Medical History:  Diagnosis Date  . Anxiety   . Arthritis   . Cervical dysplasia  1990   CIN 2 subsequent cryosurgery. All Paps normal afterwards  . Dyslipidemia   . Herpes genitalis   . Herpes labialis   . Hypercholesteremia   . IBS (irritable bowel syndrome)   . Osteopenia 04/2014   T score -1.7 FRAX 7.3%/0.8% stable from prior DEXA 2013  . Raynaud disease   . SBO (small bowel obstruction) ([place]) 10/02/2020    Allergies  Allergen Reactions  . Morphine And Related Nausea Only     Review of Systems:  No headache, visual changes, nausea, vomiting, diarrhea, constipation, dizziness, abdominal pain, skin rash, fevers, chills, night sweats, weight loss, swollen lymph nodes, body aches, joint swelling, chest pain, shortness of breath, mood changes. POSITIVE muscle aches, body aches, constipation  Objective  Blood pressure 104/74, pulse 73, height 5\' 5"  (1.651 m), weight 116 lb (52.6 kg), SpO2 98 %.   General: No apparent distress alert and oriented x3 mood and affect normal, dressed appropriately.  HEENT: Pupils equal, extraocular movements intact  Respiratory: Patient's speak in full sentences and does not appear short of breath  Cardiovascular: No lower extremity edema, non tender, no erythema  Abdominal exam shows fullness noted but no significant pain. Gait very minorly antalgic MSK:  Non tender with full range of motion and good stability and symmetric strength and tone of shoulders, elbows, wrist, hip, knee and ankles bilaterally.  Back -back exam does have loss of lordosis.  Severely tender to palpation mostly in the paraspinal musculature of L4 on L5 as well as somewhat of the sacroiliac joint right  greater than left.  Patient has tightness with straight leg test bilaterally but no true radicular pain.  Worsening pain in the back with any type of extension greater than 5 degrees.  Osteopathic findings  T9 extended rotated and side bent left L4 flexed rotated and side bent right L5 flexed rotated and side bent left Sacrum right on right        Assessment and Plan: Degenerative disc disease, lumbar Significant degenerative disc disease with spinal stenosis at L4-L5 that is severe.  Patient continues to have pain on a daily basis.  Patient responded very minorly to osteopathic manipulation.  Patient is still trying to be extremely active and would like her to continue to try to do so.  Patient was having to take ibuprofen fairly regularly and has had stomach issues.  Encouraged her to follow-up with gastroenterology.  Regarding the back really will refer patient to neurosurgery to discuss other potential options for her back.  Patient will follow up with me again in 6 to 8 weeks otherwise.  History of small bowel obstruction Patient has had a partial bowel obstruction previously.  Continuing to have difficulty with her bowel movements at this time.  Will have patient see gastroenterology and will send my note today.     Nonallopathic problems  Decision today to treat with OMT was based on Physical Exam  After verbal consent patient was treated with  ME, FPR techniques in  thoracic, lumbar, and sacral  areas  Patient tolerated the procedure well with improvement in symptoms  Patient given exercises, stretches and lifestyle modifications  See medications in patient instructions if given  Patient will follow up in 4-8 weeks      The above documentation has been reviewed and is accurate and complete Lyndal Pulley, DO       Note: This dictation was prepared with Dragon dictation along with smaller phrase technology. Any transcriptional errors that result from this process are unintentional.

## 2021-01-07 ENCOUNTER — Other Ambulatory Visit: Payer: Self-pay

## 2021-01-07 ENCOUNTER — Encounter: Payer: Self-pay | Admitting: Family Medicine

## 2021-01-07 ENCOUNTER — Ambulatory Visit: Payer: Federal, State, Local not specified - PPO | Admitting: Family Medicine

## 2021-01-07 VITALS — BP 104/74 | HR 73 | Ht 65.0 in | Wt 116.0 lb

## 2021-01-07 DIAGNOSIS — M999 Biomechanical lesion, unspecified: Secondary | ICD-10-CM | POA: Diagnosis not present

## 2021-01-07 DIAGNOSIS — M5136 Other intervertebral disc degeneration, lumbar region: Secondary | ICD-10-CM

## 2021-01-07 DIAGNOSIS — Z8719 Personal history of other diseases of the digestive system: Secondary | ICD-10-CM

## 2021-01-07 NOTE — Patient Instructions (Signed)
See Dr. Vertell Limber I will send message to GI-Also call their office for an appt I am glad you increased activity with no increase in pain  See me in 6-8 weeks

## 2021-01-07 NOTE — Assessment & Plan Note (Signed)
Significant degenerative disc disease with spinal stenosis at L4-L5 that is severe.  Patient continues to have pain on a daily basis.  Patient responded very minorly to osteopathic manipulation.  Patient is still trying to be extremely active and would like her to continue to try to do so.  Patient was having to take ibuprofen fairly regularly and has had stomach issues.  Encouraged her to follow-up with gastroenterology.  Regarding the back really will refer patient to neurosurgery to discuss other potential options for her back.  Patient will follow up with me again in 6 to 8 weeks otherwise.

## 2021-01-07 NOTE — Assessment & Plan Note (Signed)
Patient has had a partial bowel obstruction previously.  Continuing to have difficulty with her bowel movements at this time.  Will have patient see gastroenterology and will send my note today.

## 2021-01-15 DIAGNOSIS — F1121 Opioid dependence, in remission: Secondary | ICD-10-CM | POA: Diagnosis not present

## 2021-01-15 DIAGNOSIS — F411 Generalized anxiety disorder: Secondary | ICD-10-CM | POA: Diagnosis not present

## 2021-01-15 DIAGNOSIS — F1421 Cocaine dependence, in remission: Secondary | ICD-10-CM | POA: Diagnosis not present

## 2021-01-15 DIAGNOSIS — F1521 Other stimulant dependence, in remission: Secondary | ICD-10-CM | POA: Diagnosis not present

## 2021-01-21 ENCOUNTER — Ambulatory Visit: Payer: Federal, State, Local not specified - PPO | Admitting: Nurse Practitioner

## 2021-01-21 ENCOUNTER — Encounter: Payer: Self-pay | Admitting: Nurse Practitioner

## 2021-01-21 VITALS — BP 110/70 | HR 62 | Ht 65.0 in | Wt 118.0 lb

## 2021-01-21 DIAGNOSIS — Z86010 Personal history of colonic polyps: Secondary | ICD-10-CM | POA: Diagnosis not present

## 2021-01-21 DIAGNOSIS — K5904 Chronic idiopathic constipation: Secondary | ICD-10-CM

## 2021-01-21 MED ORDER — NA SULFATE-K SULFATE-MG SULF 17.5-3.13-1.6 GM/177ML PO SOLN: ORAL | 0 refills | Status: DC

## 2021-01-21 NOTE — Patient Instructions (Signed)
You have been scheduled for a colonoscopy. Please follow written instructions given to you at your visit today.  Please pick up your prep supplies at the pharmacy within the next 1-3 days. If you use inhalers (even only as needed), please bring them with you on the day of your procedure. We have sent the following medications to your pharmacy for you to pick up at your convenience: Suprep  Due to recent changes in healthcare laws, you may see the results of your imaging and laboratory studies on MyChart before your provider has had a chance to review them.  We understand that in some cases there may be results that are confusing or concerning to you. Not all laboratory results come back in the same time frame and the provider may be waiting for multiple results in order to interpret others.  Please give [place] 48 hours in order for your provider to thoroughly review all the results before contacting the office for clarification of your results.   If you are age 72 or older, your body mass index should be between 23-30. Your Body mass index is 19.64 kg/m. If this is out of the aforementioned range listed, please consider follow up with your Primary Care Provider.  If you are age 33 or younger, your body mass index should be between 19-25. Your Body mass index is 19.64 kg/m. If this is out of the aformentioned range listed, please consider follow up with your Primary Care Provider.

## 2021-01-21 NOTE — Progress Notes (Signed)
ASSESSMENT AND PLAN    # 65 year old female with chronic constipation.  --Continue daily Benefiber (she did not find 3 times daily dosing helpful).  Continue daily MiraLAX, stool softeners and Dulcolax..  --Patient is anxious about persistent constipation and inquires about having a colonoscopy. Based on number and size of polyp at time of last colonoscopy in 2017 she would not be due for repeat colonoscopy.   However, given persistent constipation in addition to occasional rectal bleeding with bowel movements, will proceed with colonoscopy .The risks and benefits of colonoscopy with possible polypectomy / biopsies were discussed and the patient agrees to proceed.  --two day 2 bowel prep. --Following colonoscopy consider trial of [place]     Primary Gastroenterologist : Bethany Bowie, MD  Chief Complaint : chronic constipation  Bethany [name] is a 65 y.o. female known to Bethany [name].  She has a history of anxiety, colon polyps, chronic constipation,  and small bowel obstruction.  May 2020 --telemedicine visit for chronic alternating constipation and diarrhea, bloating and abdominal cramps.  Psyllium was stopped, Benefiber 3 times daily started.  Water intake increased.  X-ray obtained to assess stool burden, following that she was given a bowel purge   Interval History:  History of two SBOs. Last obstruction was in November 2021. SBO resolved with conservative measures. She lost 7 pounds around the time of the SBO but has since recover the weight. Since her first SBO in June 2019 her bowels haven't moved well. She tried the TID fiber recommended at time of May 2020 Telemedicine visit but it didn't help. She doesn't ever have a normal BM, just small   " squiggly" pieces of stool. Volume of stool is low though she does defecate 1-2 times a day.  She takes 1-2 stool softners daily, Miralax 1-2 times a day and generally a Dulcoloax every night. She get  lower abdomen burning, sometimes stabbing pain when constipated. The pain relieved with defecation. No blood in stool but occasionally has scant bleeding on tissue with straining      Data Reviewed:  Dec 2021 CBC -ok Albumin 2.5, liver chemistries otherwise normal   Previous Endoscopic Evaluations / Pertinent Studies:    June 2017 screening colonoscopy  --one 5 mm polyp in the ascending colon, removed with a cold snare. Resected and retrieved. - Melanosis in the colon. - Non-bleeding internal hemorrhoids.  Pathology --Tubular adenoma and melanosis coli    Past Medical History:  Diagnosis Date  . Anxiety   . Arthritis   . Cervical dysplasia 1990   CIN 2 subsequent cryosurgery. All Paps normal afterwards  . Dyslipidemia   . Herpes genitalis   . Herpes labialis   . Hypercholesteremia   . IBS (irritable bowel syndrome)   . Osteopenia 04/2014   T score -1.7 FRAX 7.3%/0.8% stable from prior DEXA 2013  . Raynaud disease   . SBO (small bowel obstruction) ([place]) 10/02/2020    Current Medications, Allergies, Past Surgical History, Family History and Social History were reviewed in [employer] record.   Current Outpatient Medications  Medication Sig Dispense Refill  . allopurinol (ZYLOPRIM) 100 MG tablet Take 2 tablets (200 mg total) by mouth daily. 30 tablet 3  . b complex vitamins tablet Take 1 tablet by mouth daily.    . bisacodyl (DULCOLAX) 5 MG EC tablet Take 1 tablet (5 mg total) by mouth daily as needed for moderate constipation. 15 tablet 0  .  cholecalciferol (VITAMIN D) 1000 UNITS tablet Take 1,000 Units by mouth daily.    . cycloSPORINE (RESTASIS) 0.05 % ophthalmic emulsion INSTILL 1 DROP IN BOTH EYES TWICE DAILY    . docusate sodium (COLACE) 100 MG capsule Take 1 capsule (100 mg total) by mouth 2 (two) times daily. 30 capsule 0  . gabapentin (NEURONTIN) 600 MG tablet Take 1,200 mg by mouth 2 (two) times daily.     Marland Kitchen ibuprofen (ADVIL,MOTRIN) 800  MG tablet Take 1 tablet (800 mg total) by mouth every 8 (eight) hours as needed. 60 tablet 1  . lamoTRIgine (LAMICTAL) 100 MG tablet Take 100 mg by mouth 2 (two) times daily.   0  . LOTEMAX SM 0.38 % GEL Apply 1 drop to eye 4 (four) times daily.     . methocarbamol (ROBAXIN) 500 MG tablet Take 1 tablet (500 mg total) by mouth every 6 (six) hours as needed for muscle spasms. 20 tablet 0  . oxyCODONE-acetaminophen (PERCOCET) 5-325 MG tablet Take 1-2 tablets by mouth every 6 (six) hours as needed for severe pain. (Patient not taking: Reported on 12/10/2020) 20 tablet 0  . polyethylene glycol (MIRALAX) 17 g packet Take 17 g by mouth daily. 14 each 0  . PROCTOZONE-HC 2.5 % rectal cream apply rectally twice a day 30 g 1  . promethazine (PHENERGAN) 25 MG suppository Place 1 suppository (25 mg total) rectally every 6 (six) hours as needed for nausea. 12 suppository 1  . simvastatin (ZOCOR) 20 MG tablet TAKE 1 TABLET(20 MG) BY MOUTH DAILY 90 tablet 0  . traZODone (DESYREL) 50 MG tablet Take 50 mg by mouth at bedtime.    . vitamin C (ASCORBIC ACID) 500 MG tablet Take 500 mg by mouth daily.     No current facility-administered medications for this visit.    Review of Systems: No chest pain. No shortness of breath. No urinary complaints.   PHYSICAL EXAM :    Wt Readings from Last 3 Encounters:  01/07/21 116 lb (52.6 kg)  12/10/20 117 lb 9.6 oz (53.3 kg)  11/28/20 116 lb (52.6 kg)    BP 110/70   Pulse 62   Ht 5\' 5"  (1.651 m)   Wt 118 lb (53.5 kg)   BMI 19.64 kg/m  Constitutional:  Pleasant thin female in no acute distress. Psychiatric: Normal mood and affect. Behavior is normal. EENT: Pupils normal.  Conjunctivae are normal. No scleral icterus. Neck supple.  Cardiovascular: Normal rate, regular rhythm. No edema Pulmonary/chest: Effort normal and breath sounds normal. No wheezing, rales or rhonchi. Abdominal: Soft, nondistended, nontender. Bowel sounds active throughout. There are no masses  palpable. No hepatomegaly. Rectal: Tiny external hemorrhoid. No fissures appreciated Neurological: Alert and oriented to person place and time. Skin: Skin is warm and dry. No rashes noted.  Bethany Savoy, NP  01/21/2021, 9:21 AM

## 2021-01-23 IMAGING — DX DG ABD PORTABLE 1V
1 series · 1 of 1 positions shown · non-contrast
Comparison: October 01, 2020.

CLINICAL DATA: Small bowel obstruction.

EXAM:
PORTABLE ABDOMEN - 1 VIEW

[abdomen kub]
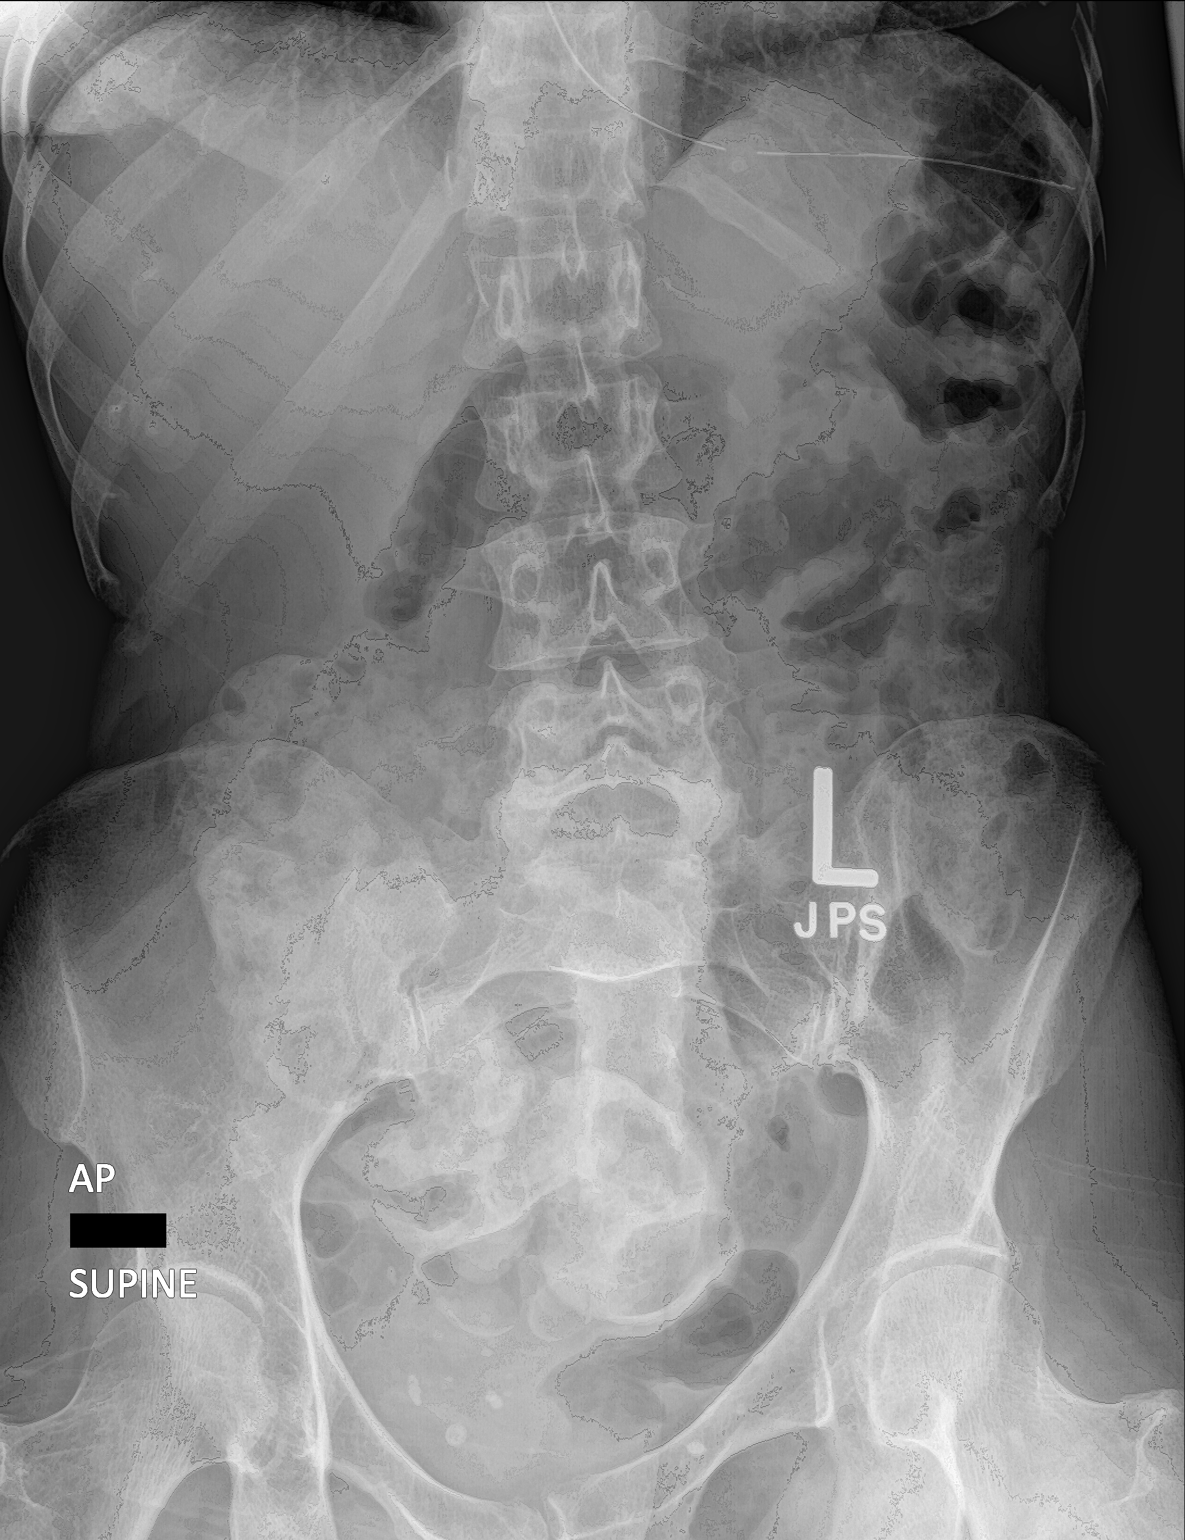

[1 of 1 positions shown; findings below may reference images not displayed]

FINDINGS: Small bowel dilatation noted on prior exam is significantly
improved. Residual contrast and stool is seen in the colon.
Phleboliths are noted in the pelvis. Nasogastric tube tip is seen in
proximal stomach.
IMPRESSION: Small bowel dilatation noted on prior exam is significantly
improved.

## 2021-01-27 DIAGNOSIS — H02836 Dermatochalasis of left eye, unspecified eyelid: Secondary | ICD-10-CM | POA: Diagnosis not present

## 2021-01-27 DIAGNOSIS — H02833 Dermatochalasis of right eye, unspecified eyelid: Secondary | ICD-10-CM | POA: Diagnosis not present

## 2021-01-27 DIAGNOSIS — H16223 Keratoconjunctivitis sicca, not specified as Sjogren's, bilateral: Secondary | ICD-10-CM | POA: Diagnosis not present

## 2021-01-31 ENCOUNTER — Other Ambulatory Visit: Payer: Self-pay | Admitting: Family Medicine

## 2021-02-05 DIAGNOSIS — F1421 Cocaine dependence, in remission: Secondary | ICD-10-CM | POA: Diagnosis not present

## 2021-02-05 DIAGNOSIS — F1521 Other stimulant dependence, in remission: Secondary | ICD-10-CM | POA: Diagnosis not present

## 2021-02-05 DIAGNOSIS — F411 Generalized anxiety disorder: Secondary | ICD-10-CM | POA: Diagnosis not present

## 2021-02-05 DIAGNOSIS — F1121 Opioid dependence, in remission: Secondary | ICD-10-CM | POA: Diagnosis not present

## 2021-02-10 DIAGNOSIS — G8929 Other chronic pain: Secondary | ICD-10-CM | POA: Diagnosis not present

## 2021-02-10 DIAGNOSIS — M5441 Lumbago with sciatica, right side: Secondary | ICD-10-CM | POA: Diagnosis not present

## 2021-02-10 DIAGNOSIS — M4316 Spondylolisthesis, lumbar region: Secondary | ICD-10-CM

## 2021-02-10 DIAGNOSIS — M5442 Lumbago with sciatica, left side: Secondary | ICD-10-CM | POA: Diagnosis not present

## 2021-02-10 HISTORY — DX: Spondylolisthesis, lumbar region: M43.16

## 2021-02-10 HISTORY — DX: Lumbago with sciatica, right side: M54.41

## 2021-02-11 ENCOUNTER — Other Ambulatory Visit: Payer: Self-pay | Admitting: Neurosurgery

## 2021-02-13 DIAGNOSIS — L821 Other seborrheic keratosis: Secondary | ICD-10-CM | POA: Diagnosis not present

## 2021-02-13 DIAGNOSIS — D225 Melanocytic nevi of trunk: Secondary | ICD-10-CM | POA: Diagnosis not present

## 2021-02-13 DIAGNOSIS — L820 Inflamed seborrheic keratosis: Secondary | ICD-10-CM | POA: Diagnosis not present

## 2021-02-13 DIAGNOSIS — L814 Other melanin hyperpigmentation: Secondary | ICD-10-CM | POA: Diagnosis not present

## 2021-02-13 DIAGNOSIS — L570 Actinic keratosis: Secondary | ICD-10-CM | POA: Diagnosis not present

## 2021-02-13 DIAGNOSIS — Z85828 Personal history of other malignant neoplasm of skin: Secondary | ICD-10-CM | POA: Diagnosis not present

## 2021-02-18 NOTE — Progress Notes (Deleted)
[place] Phone: [phone] Subjective:    I'm seeing this patient by the request  of:  Denita Lung, MD  CC:   [id number]  Bethany [name] is a 65 y.o. female coming in with complaint of back and neck pain. OMT 01/07/2021. Patient states   Medications patient has been prescribed: None  Taking:         Reviewed prior external information including notes and imaging from previsou exam, outside providers and external EMR if available.   As well as notes that were available from care everywhere and other healthcare systems.  Past medical history, social, surgical and family history all reviewed in electronic medical record.  No pertanent information unless stated regarding to the chief complaint.   Past Medical History:  Diagnosis Date  . Anxiety   . Arthritis   . Cervical dysplasia 1990   CIN 2 subsequent cryosurgery. All Paps normal afterwards  . Dyslipidemia   . Herpes genitalis   . Herpes labialis   . Hypercholesteremia   . IBS (irritable bowel syndrome)   . Osteopenia 04/2014   T score -1.7 FRAX 7.3%/0.8% stable from prior DEXA 2013  . Raynaud disease   . SBO (small bowel obstruction) ([place]) 10/02/2020    Allergies  Allergen Reactions  . Morphine And Related Nausea Only     Review of Systems:  No headache, visual changes, nausea, vomiting, diarrhea, constipation, dizziness, abdominal pain, skin rash, fevers, chills, night sweats, weight loss, swollen lymph nodes, body aches, joint swelling, chest pain, shortness of breath, mood changes. POSITIVE muscle aches  Objective  There were no vitals taken for this visit.   General: No apparent distress alert and oriented x3 mood and affect normal, dressed appropriately.  HEENT: Pupils equal, extraocular movements intact  Respiratory: Patient's speak in full sentences and does not appear short of breath  Cardiovascular: No lower extremity  edema, non tender, no erythema  Neuro: Cranial nerves II through XII are intact, neurovascularly intact in all extremities with 2+ DTRs and 2+ pulses.  Gait normal with good balance and coordination.  MSK:  Non tender with full range of motion and good stability and symmetric strength and tone of shoulders, elbows, wrist, hip, knee and ankles bilaterally.  Back - Normal skin, Spine with normal alignment and no deformity.  No tenderness to vertebral process palpation.  Paraspinous muscles are not tender and without spasm.   Range of motion is full at neck and lumbar sacral regions  Osteopathic findings  C2 flexed rotated and side bent right C6 flexed rotated and side bent left T3 extended rotated and side bent right inhaled rib T9 extended rotated and side bent left L2 flexed rotated and side bent right Sacrum right on right       Assessment and Plan:    Nonallopathic problems  Decision today to treat with OMT was based on Physical Exam  After verbal consent patient was treated with HVLA, ME, FPR techniques in cervical, rib, thoracic, lumbar, and sacral  areas  Patient tolerated the procedure well with improvement in symptoms  Patient given exercises, stretches and lifestyle modifications  See medications in patient instructions if given  Patient will follow up in 4-8 weeks      The above documentation has been reviewed and is accurate and complete Jacqualin Combes       Note: This dictation was prepared with Dragon dictation along with smaller phrase technology. Any  transcriptional errors that result from this process are unintentional.

## 2021-02-19 ENCOUNTER — Ambulatory Visit: Payer: Federal, State, Local not specified - PPO | Admitting: Family Medicine

## 2021-02-21 ENCOUNTER — Other Ambulatory Visit: Payer: Self-pay

## 2021-02-21 ENCOUNTER — Ambulatory Visit: Payer: Federal, State, Local not specified - PPO | Admitting: Family Medicine

## 2021-02-21 ENCOUNTER — Encounter: Payer: Self-pay | Admitting: Family Medicine

## 2021-02-21 VITALS — BP 104/70 | HR 74 | Temp 98.6°F | Wt 116.2 lb

## 2021-02-21 DIAGNOSIS — Z6379 Other stressful life events affecting family and household: Secondary | ICD-10-CM | POA: Diagnosis not present

## 2021-02-21 DIAGNOSIS — N898 Other specified noninflammatory disorders of vagina: Secondary | ICD-10-CM | POA: Diagnosis not present

## 2021-02-21 NOTE — Progress Notes (Addendum)
   Subjective:    Patient ID: Bethany [name], female    DOB: 09-Oct-1956, 65 y.o.   MRN: [id number]  HPI She complains of a 4-week history of vaginal irritation.  She was thinking it might of been clothing that rubbed her.  She has had no discharge but does complain of some slight burning in his area. Also she is helping to take care of her elderly father who apparently is recuperating from recent hospital admission.  She does have other siblings that are helping with his care but this is stressing her.   Review of Systems     Objective:   Physical Exam Vaginal exam does show a raw slightly ulcerated lesion that is not erythematous with no induration on the right labia.       Assessment & Plan:  Vaginal irritation  Stress due to illness of family member Difficult to say exactly what this is but it does not appear to be a yeast infection or herpes.  There is no induration in the area but if it continues after the use of Neosporin I will refer to her gynecologist to have them look at it in more detail. I then discussed the stress that she is under helping to take care of her father.  Strongly encouraged her and her siblings to make sure he is taking care of but not get caught up with the care and involve home health agencies etc.

## 2021-02-22 NOTE — Progress Notes (Signed)
Reviewed and agree with documentation and assessment and plan. K. Veena Tassie Pollett , MD   

## 2021-02-24 ENCOUNTER — Encounter: Payer: Self-pay | Admitting: Gastroenterology

## 2021-02-24 ENCOUNTER — Telehealth: Payer: Self-pay | Admitting: Gastroenterology

## 2021-02-24 NOTE — Telephone Encounter (Signed)
Inbound call from patient. Cancelled procedure 02/26/21 due to dad being sick and her being his caregiver. She will call back to reschedule.

## 2021-02-25 NOTE — Telephone Encounter (Signed)
ok 

## 2021-02-26 ENCOUNTER — Encounter: Payer: Federal, State, Local not specified - PPO | Admitting: Gastroenterology

## 2021-03-07 ENCOUNTER — Inpatient Hospital Stay: Admit: 2021-03-07 | Payer: Federal, State, Local not specified - PPO | Admitting: Neurosurgery

## 2021-03-07 SURGERY — POSTERIOR LUMBAR FUSION 1 LEVEL
Anesthesia: General | Site: Back

## 2021-03-10 ENCOUNTER — Other Ambulatory Visit: Payer: Self-pay

## 2021-03-10 ENCOUNTER — Ambulatory Visit: Payer: Federal, State, Local not specified - PPO | Admitting: Family Medicine

## 2021-03-10 ENCOUNTER — Encounter: Payer: Self-pay | Admitting: Family Medicine

## 2021-03-10 ENCOUNTER — Other Ambulatory Visit: Payer: Self-pay | Admitting: Family Medicine

## 2021-03-10 VITALS — BP 136/82 | HR 56 | Temp 96.2°F | Wt 117.0 lb

## 2021-03-10 DIAGNOSIS — H9193 Unspecified hearing loss, bilateral: Secondary | ICD-10-CM

## 2021-03-10 DIAGNOSIS — D519 Vitamin B12 deficiency anemia, unspecified: Secondary | ICD-10-CM | POA: Diagnosis not present

## 2021-03-10 DIAGNOSIS — D529 Folate deficiency anemia, unspecified: Secondary | ICD-10-CM | POA: Diagnosis not present

## 2021-03-10 DIAGNOSIS — R413 Other amnesia: Secondary | ICD-10-CM

## 2021-03-10 DIAGNOSIS — Z113 Encounter for screening for infections with a predominantly sexual mode of transmission: Secondary | ICD-10-CM | POA: Diagnosis not present

## 2021-03-10 NOTE — Progress Notes (Signed)
   Subjective:    Patient ID: Bethany [name], female    DOB: May 15, 1956, 65 y.o.   MRN: [id number]  HPI She is here for multiple issues.  She does complain of decreased hearing over the last year or so.  She states that she has to turn things up in order to hear better.  Her other issues over the last year she has noted issues with her memory.  She will forget when people tell her something and when I repeated it because causes trouble.  She is also noted difficulty on occasion with her train of thought indicating when she would start talking about something she would forget later on in the conversation what she was talking about.  This has been slowly getting worse over the last year.  No headache, blurred vision, double vision, weakness, numbness tingling.   Review of Systems     Objective:   Physical Exam Alert and in no distress.  EOMI.  Other cranial nerves grossly intact.  DTRs normal.  Tympanic membranes and canals are normal. Pharyngeal area is normal. Neck is supple without adenopathy or thyromegaly. Cardiac exam shows a regular sinus rhythm without murmurs or gallops. Lungs are clear to auscultation. MMSE is 29.  Hearing test showed high-frequency hearing loss.       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  She was comfortable with that and when it bothers her more, we will refer her for possible hearing aids. Refer to neurology to further evaluate her memory issues.

## 2021-03-11 LAB — CBC WITH DIFFERENTIAL/PLATELET
Basophils Absolute: 0.1 10*3/uL (ref 0.0–0.2)
Basos: 1 %
EOS (ABSOLUTE): 0.1 10*3/uL (ref 0.0–0.4)
Eos: 2 %
Hematocrit: 37.5 % (ref 34.0–46.6)
Hemoglobin: 13 g/dL (ref 11.1–15.9)
Immature Grans (Abs): 0 10*3/uL (ref 0.0–0.1)
Immature Granulocytes: 0 %
Lymphocytes Absolute: 1.4 10*3/uL (ref 0.7–3.1)
Lymphs: 28 %
MCH: 33.2 pg — ABNORMAL HIGH (ref 26.6–33.0)
MCHC: 34.7 g/dL (ref 31.5–35.7)
MCV: 96 fL (ref 79–97)
Monocytes Absolute: 0.4 10*3/uL (ref 0.1–0.9)
Monocytes: 7 %
Neutrophils Absolute: 3.1 10*3/uL (ref 1.4–7.0)
Neutrophils: 62 %
Platelets: 215 10*3/uL (ref 150–450)
RBC: 3.92 x10E6/uL (ref 3.77–5.28)
RDW: 11.5 % — ABNORMAL LOW (ref 11.7–15.4)
WBC: 5 10*3/uL (ref 3.4–10.8)

## 2021-03-11 LAB — COMPREHENSIVE METABOLIC PANEL
ALT: 22 IU/L (ref 0–32)
AST: 30 IU/L (ref 0–40)
Albumin/Globulin Ratio: 2.4 — ABNORMAL HIGH (ref 1.2–2.2)
Albumin: 5 g/dL — ABNORMAL HIGH (ref 3.8–4.8)
Alkaline Phosphatase: 86 IU/L (ref 44–121)
BUN/Creatinine Ratio: 9 — ABNORMAL LOW (ref 12–28)
BUN: 10 mg/dL (ref 8–27)
Bilirubin Total: 0.2 mg/dL (ref 0.0–1.2)
CO2: 25 mmol/L (ref 20–29)
Calcium: 9.7 mg/dL (ref 8.7–10.3)
Chloride: 103 mmol/L (ref 96–106)
Creatinine, Ser: 1.12 mg/dL — ABNORMAL HIGH (ref 0.57–1.00)
Globulin, Total: 2.1 g/dL (ref 1.5–4.5)
Glucose: 97 mg/dL (ref 65–99)
Potassium: 5.1 mmol/L (ref 3.5–5.2)
Sodium: 142 mmol/L (ref 134–144)
Total Protein: 7.1 g/dL (ref 6.0–8.5)
eGFR: 55 mL/min/{1.73_m2} — ABNORMAL LOW (ref >59)

## 2021-03-11 LAB — RPR: RPR Ser Ql: NONREACTIVE

## 2021-03-11 LAB — VITAMIN B12: Vitamin B-12: 823 pg/mL (ref 232–1245)

## 2021-03-11 LAB — FOLATE: Folate: >20 ng/mL (ref >3.0)

## 2021-03-14 ENCOUNTER — Encounter: Payer: Self-pay | Admitting: Physician Assistant

## 2021-03-14 LAB — SPECIMEN STATUS REPORT

## 2021-03-14 LAB — TSH: TSH: 3.82 u[IU]/mL (ref 0.450–4.500)

## 2021-03-26 DIAGNOSIS — F411 Generalized anxiety disorder: Secondary | ICD-10-CM | POA: Diagnosis not present

## 2021-03-26 DIAGNOSIS — F1521 Other stimulant dependence, in remission: Secondary | ICD-10-CM | POA: Diagnosis not present

## 2021-03-26 DIAGNOSIS — F1121 Opioid dependence, in remission: Secondary | ICD-10-CM | POA: Diagnosis not present

## 2021-03-26 DIAGNOSIS — F1421 Cocaine dependence, in remission: Secondary | ICD-10-CM | POA: Diagnosis not present

## 2021-04-03 ENCOUNTER — Ambulatory Visit: Payer: Federal, State, Local not specified - PPO | Admitting: Physician Assistant

## 2021-04-03 ENCOUNTER — Encounter: Payer: Self-pay | Admitting: Physician Assistant

## 2021-04-03 ENCOUNTER — Other Ambulatory Visit: Payer: Self-pay

## 2021-04-03 VITALS — BP 130/83 | HR 89 | Ht 65.0 in | Wt 114.6 lb

## 2021-04-03 DIAGNOSIS — R413 Other amnesia: Secondary | ICD-10-CM | POA: Diagnosis not present

## 2021-04-03 NOTE — Progress Notes (Signed)
Assessment/Plan:   65 y.o. year old female with risk factors including  ADD, hyperlipidemia, sleep disorder, seen today for evaluation of memory loss. MoCA today is 26/30 with slight deficiency in the delayed recall and naming.      Will proceed with further workup. Recommendations are as follows   Memory Loss   . MRI of the brain to evaluate for bleeding, brain size and other abnormalities . Neurocognitive testing to further determine what causes memory changes including sleep, stress, anxiety depression . Discussed safety both in and out of the home.  . Discussed the importance of regular daily schedule with inclusion of crossword puzzles to maintain brain function.  . Continue to monitor mood with PCP and Psychiatry.  . Continue to stay active exercising  at least 30 minutes at least 3 times a week.  . Naps should be scheduled and should be no longer than 60 minutes and should not occur after 2 PM.  . Mediterranean diet is recommended  . Follow up once results above are available   Subjective:    The patient is seen in neurologic consultation at the request of Denita Lung, MD for the evaluation of memory.  The patient is here alone.   Ms. Bethany [name] is a very pleasant 65 y.o. year old RH  female who has had memory issues for about 3 years, worse over the last year when she began to notice "repeating myself, forgetting appointments, and forgetting what people told me ". She reports the symptoms began after her knee replacement while taking strong pain medications when the brain began to slow down, making more mistakes than before.  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 Since that time, the patient struggles with depression, without irritability but with moments of anxiety, but they are well controlled with her medication regimen.  She has a psychiatrist who follow-ups on her condition at [facility], Dr. Gaynelle Adu  Book in  [place]. She denies hallucinations or paranoia.  She sleeps about 5 to 6 hours, and naps for about 1 hour a day.  She denies any vivid dreams when sleeping, or sleepwalking.  She lives with her husband Hegel and her 60 year old dog who she cares deeply about. She is very independent in her ADLs, dressing and bathing without help, doing her own finances.  She denies missing any payments or over paying.  Her appetite is good, and denies trouble swallowing.  She used to cook more when her children were living with her. She denies leaving the stove on accidentally or the faucet on.  She has 2 daughters, and 2 grandchildren whom she visits frequently. She admits to not reading as much as she would like to, or doing crossword puzzles or word finding. She likes to travel and is planning a trip to [place] next month. She likes to walk frequently and exercising attending the [facility]. She has a "bad back", severe spinal stenosis, and she is to undergo surgery soon, but because her father is dying at age 35, she decided to wait a while.   Denies headaches, trauma, or injuries to the head, double vision,  dizziness, focal numbness or tingling, unilateral weakness or tremors.  She reports urine stress incontinence, and some involuntary nocturnal incontinence, having had "an accident 6 weeks ago indicated".  Denies constipation or diarrhea. Denies history of OSA but "snores a lot ". Recovering alcoholic in remission, none 20  years . Quit 45 years of smoking in 2007.  No family history of Alzheimer's disease or any other type of dementia.     Allergies  Allergen Reactions  . Morphine And Related Nausea Only    Current Outpatient Medications  Medication Instructions  . b complex vitamins tablet 1 tablet, Oral, Daily  . bisacodyl (DULCOLAX) 5 mg, Oral, Daily PRN  . cholecalciferol (VITAMIN D) 1,000 Units, Oral, Daily  . cycloSPORINE (RESTASIS) 0.05 % ophthalmic emulsion INSTILL 1 DROP IN BOTH EYES  TWICE DAILY  . docusate sodium (COLACE) 100 mg, Oral, 2 times daily  . gabapentin (NEURONTIN) 1,200 mg, Oral, 2 times daily  . ibuprofen (ADVIL) 800 mg, Oral, Every 8 hours PRN  . lamoTRIgine (LAMICTAL) 100 mg, Oral, 2 times daily  . LOTEMAX SM 0.38 % GEL 1 drop, 4 times daily  . methocarbamol (ROBAXIN) 500 mg, Oral, Every 6 hours PRN  . Na Sulfate-K Sulfate-Mg Sulf 17.5-3.13-1.6 GM/177ML SOLN Take as directed per colonoscopy instructions  . polyethylene glycol (MIRALAX) 17 g, Oral, Daily  . PROCTOZONE-HC 2.5 % rectal cream apply rectally twice a day  . promethazine (PHENERGAN) 25 mg, Rectal, Every 6 hours PRN  . simvastatin (ZOCOR) 20 MG tablet TAKE 1 TABLET BY MOUTH EVERY DAY  . traZODone (DESYREL) 50 mg, Oral, Daily at bedtime  . vitamin C (ASCORBIC ACID) 500 mg, Oral, Daily  . zinc gluconate 1.5 mg/mL SOLN No dose, route, or frequency recorded.     VITALS:   Vitals:   04/03/21 1346  BP: 130/83  Pulse: 89  SpO2: 96%  Weight: 114 lb 9.6 oz (52 kg)  Height: 5\' 5"  (1.651 m)   Depression screen [facility] 2/9 02/21/2021 10/31/2019 09/13/2018 08/04/2017 06/18/2017  Decreased Interest 0 0 0 0 0  Down, Depressed, Hopeless 0 0 1 0 0  PHQ - 2 Score 0 0 1 0 0    HEENT:  Normocephalic, atraumatic. The mucous membranes are moist. The superficial temporal arteries are without ropiness or tenderness. Well groomed.  Cardiovascular: Regular rate and rhythm. Lungs: Clear to auscultation bilaterally. Neck: There are no carotid bruits noted bilaterally.  NEUROLOGICAL:  Orientation:   [facility] Cognitive Assessment  04/03/2021  Visuospatial/ Executive (0/5) 5  Naming (0/3) 2  Attention: Read list of digits (0/2) 2  Attention: Read list of letters (0/1) 1  Attention: Serial 7 subtraction starting at 100 (0/3) 3  Language: Repeat phrase (0/2) 2  Language : Fluency (0/1) 1  Abstraction (0/2) 2  Delayed Recall (0/5) 2  Orientation (0/6) 6  Total 26  Adjusted Score (based on education) 26   Alert  and oriented to person, place and time.  No aphasia or dysarthria.  Fund of knowledge is appropriate.  Recent and remote memory are intact.  Attention and concentration are normal.  Able to name objects and repeat phrases. Delayed recall 2/5, and 2/3 in naming animals ([place]) Cranial nerves: There is good facial symmetry. Extraocular muscles are intact and visual fields are full to confrontational testing. Speech is fluent and clear. Soft palate rises symmetrically and there is no tongue deviation. Hearing is intact to conversational tone. Tone: Tone is good throughout. Sensation: Sensation is intact to light touch and pinprick throughout. Vibration is intact at the bilateral big toe. There is no extinction with double simultaneous stimulation. There is no sensory dermatomal level identified. Coordination:  The patient has no difficulty with RAM's or FNF bilaterally. Normal finger to nose  Motor: Strength is 5/5 in the bilateral  upper and lower extremities. There is no pronator drift.  There are no fasciculations noted. DTR's: Deep tendon reflexes are 2/4 at the bilateral biceps, triceps, brachioradialis, patella and achilles.  Plantar responses are downgoing bilaterally. Gait and Station: The patient is able to ambulate without difficulty. The patient is able to heel toe walk without any difficulty. The patient is able to ambulate in a tandem fashion. The patient is able to stand in the Romberg position.      Total time spent on today's visit was  60 minutes, including both face-to-face time and nonface-to-face time.  Time included that spent on review of records (prior notes available to me/labs/imaging if pertinent), discussing treatment and goals, answering patient's questions and coordinating care.  Cc:  Denita Lung, MD  Sharene Butters 04/03/2021 2:29 PM

## 2021-04-03 NOTE — Patient Instructions (Addendum)
It was a pleasure to see you today at our office.   Recommendations:  Meds: Follow up once the results are available  Neurocognitive evaluation at our office MRI of the brain, the office will call you to arrange you appointment   RECOMMENDATIONS FOR ALL PATIENTS WITH MEMORY PROBLEMS: 1. Continue to exercise (Recommend 30 minutes of walking everyday, or 3 hours every week) 2. Increase social interactions - continue going to [place] and enjoy social gatherings with friends and family 3. Eat healthy, avoid fried foods and eat more fruits and vegetables 4. Maintain adequate blood pressure, blood sugar, and blood cholesterol level. Reducing the risk of stroke and cardiovascular disease also helps promoting better memory. 5. Avoid stressful situations. Live a simple life and avoid aggravations. Organize your time and prepare for the next day in anticipation. 6. Sleep well, avoid any interruptions of sleep and avoid any distractions in the bedroom that may interfere with adequate sleep quality 7. Avoid sugar, avoid sweets as there is a strong link between excessive sugar intake, diabetes, and cognitive impairment We discussed the Mediterranean diet, which has been shown to help patients reduce the risk of progressive memory disorders and reduces cardiovascular risk. This includes eating fish, eat fruits and green leafy vegetables, nuts like almonds and hazelnuts, walnuts, and also use olive oil. Avoid fast foods and fried foods as much as possible. Avoid sweets and sugar as sugar use has been linked to worsening of memory function.  There is always a concern of gradual progression of memory problems. If this is the case, then we may need to adjust level of care according to patient needs. Support, both to the patient and caregiver, should then be put into place.      You have been referred for a neuropsychological evaluation (i.e., evaluation of memory and thinking abilities). Please bring someone  with you to this appointment if possible, as it is helpful for the doctor to hear from both you and another adult who knows you well. Please bring eyeglasses and hearing aids if you wear them.    The evaluation will take approximately 3 hours and has two parts:   . The first part is a clinical interview with the neuropsychologist (Dr. Melvyn Novas or Dr. Nicole Kindred). During the interview, the neuropsychologist will speak with you and the individual you brought to the appointment.    . The second part of the evaluation is testing with the doctor's technician Hinton Dyer or Maudie Mercury). During the testing, the technician will ask you to remember different types of material, solve problems, and answer some questionnaires. Your family member will not be present for this portion of the evaluation.   Please note: We must reserve several hours of the neuropsychologist's time and the psychometrician's time for your evaluation appointment. As such, there is a No-Show fee of $100. If you are unable to attend any of your appointments, please contact our office as soon as possible to reschedule.    FALL PRECAUTIONS: Be cautious when walking. Scan the area for obstacles that may increase the risk of trips and falls. When getting up in the mornings, sit up at the edge of the bed for a few minutes before getting out of bed. Consider elevating the bed at the head end to avoid drop of blood pressure when getting up. Walk always in a well-lit room (use night lights in the walls). Avoid area rugs or power cords from appliances in the middle of the walkways. Use a walker or a cane  if necessary and consider physical therapy for balance exercise. Get your eyesight checked regularly.  FINANCIAL OVERSIGHT: Supervision, especially oversight when making financial decisions or transactions is also recommended.  HOME SAFETY: Consider the safety of the kitchen when operating appliances like stoves, microwave oven, and blender. Consider having  supervision and share cooking responsibilities until no longer able to participate in those. Accidents with firearms and other hazards in the house should be identified and addressed as well.   ABILITY TO BE LEFT ALONE: If patient is unable to contact 911 operator, consider using LifeLine, or when the need is there, arrange for someone to stay with patients. Smoking is a fire hazard, consider supervision or cessation. Risk of wandering should be assessed by caregiver and if detected at any point, supervision and safe proof recommendations should be instituted.  MEDICATION SUPERVISION: Inability to self-administer medication needs to be constantly addressed. Implement a mechanism to ensure safe administration of the medications.   DRIVING: Regarding driving, in patients with progressive memory problems, driving will be impaired. We advise to have someone else do the driving if trouble finding directions or if minor accidents are reported. Independent driving assessment is available to determine safety of driving.      Mediterranean Diet A Mediterranean diet refers to food and lifestyle choices that are based on the traditions of countries located on the [employer]. This way of eating has been shown to help prevent certain conditions and improve outcomes for people who have chronic diseases, like kidney disease and heart disease. What are tips for following this plan? Lifestyle   Cook and eat meals together with your family, when possible.  Drink enough fluid to keep your urine clear or pale yellow.  Be physically active every day. This includes:  Aerobic exercise like running or swimming.  Leisure activities like gardening, walking, or housework.  Get 7-8 hours of sleep each night.  If recommended by your health care provider, drink red wine in moderation. This means 1 glass a day for nonpregnant women and 2 glasses a day for men. A glass of wine equals 5 oz (150 mL). Reading  food labels   Check the serving size of packaged foods. For foods such as rice and pasta, the serving size refers to the amount of cooked product, not dry.  Check the total fat in packaged foods. Avoid foods that have saturated fat or trans fats.  Check the ingredients list for added sugars, such as corn syrup. Shopping   At the grocery store, buy most of your food from the areas near the walls of the store. This includes:  Fresh fruits and vegetables (produce).  Grains, beans, nuts, and seeds. Some of these may be available in unpackaged forms or large amounts (in bulk).  Fresh seafood.  Poultry and eggs.  Low-fat dairy products.  Buy whole ingredients instead of prepackaged foods.  Buy fresh fruits and vegetables in-season from local farmers markets.  Buy frozen fruits and vegetables in resealable bags.  If you do not have access to quality fresh seafood, buy precooked frozen shrimp or canned fish, such as tuna, salmon, or sardines.  Buy small amounts of raw or cooked vegetables, salads, or olives from the deli or salad bar at your store.  Stock your pantry so you always have certain foods on hand, such as olive oil, canned tuna, canned tomatoes, rice, pasta, and beans. Cooking   Cook foods with extra-virgin olive oil instead of using butter or other vegetable oils.  Have meat as a side dish, and have vegetables or grains as your main dish. This means having meat in small portions or adding small amounts of meat to foods like pasta or stew.  Use beans or vegetables instead of meat in common dishes like chili or lasagna.  Experiment with different cooking methods. Try roasting or broiling vegetables instead of steaming or sauteing them.  Add frozen vegetables to soups, stews, pasta, or rice.  Add nuts or seeds for added healthy fat at each meal. You can add these to yogurt, salads, or vegetable dishes.  Marinate fish or vegetables using olive oil, lemon juice, garlic,  and fresh herbs. Meal planning   Plan to eat 1 vegetarian meal one day each week. Try to work up to 2 vegetarian meals, if possible.  Eat seafood 2 or more times a week.  Have healthy snacks readily available, such as:  Vegetable sticks with hummus.  Greek yogurt.  Fruit and nut trail mix.  Eat balanced meals throughout the week. This includes:  Fruit: 2-3 servings a day  Vegetables: 4-5 servings a day  Low-fat dairy: 2 servings a day  Fish, poultry, or lean meat: 1 serving a day  Beans and legumes: 2 or more servings a week  Nuts and seeds: 1-2 servings a day  Whole grains: 6-8 servings a day  Extra-virgin olive oil: 3-4 servings a day  Limit red meat and sweets to only a few servings a month What are my food choices?  Mediterranean diet  Recommended  Grains: Whole-grain pasta. Brown rice. Bulgar wheat. Polenta. Couscous. Whole-wheat bread. [place].  Vegetables: Artichokes. Beets. Broccoli. Cabbage. Carrots. Eggplant. Green beans. Chard. Kale. Spinach. Onions. Leeks. Peas. Squash. Tomatoes. Peppers. Radishes.  Fruits: Apples. Apricots. Avocado. Berries. Bananas. Cherries. Dates. Figs. Grapes. Lemons. Melon. Oranges. Peaches. Plums. Pomegranate.  Meats and other protein foods: Beans. Almonds. Sunflower seeds. Pine nuts. Peanuts. [place]. Salmon. Scallops. Shrimp. Concord. Tilapia. Clams. Oysters. Eggs.  Dairy: Low-fat milk. Cheese. Greek yogurt.  Beverages: Water. Red wine. Herbal tea.  Fats and oils: Extra virgin olive oil. Avocado oil. Grape seed oil.  Sweets and desserts: [place] yogurt with honey. Baked apples. Poached pears. Trail mix.  Seasoning and other foods: Basil. Cilantro. Coriander. Cumin. Mint. Parsley. Sage. Rosemary. Tarragon. Garlic. Oregano. Thyme. Pepper. Balsalmic vinegar. Tahini. Hummus. Tomato sauce. Olives. Mushrooms.  Limit these  Grains: Prepackaged pasta or rice dishes. Prepackaged cereal with added sugar.  Vegetables: Deep fried  potatoes (french fries).  Fruits: Fruit canned in syrup.  Meats and other protein foods: Beef. Pork. Lamb. Poultry with skin. Hot dogs. Berniece Salines.  Dairy: Ice cream. Sour cream. Whole milk.  Beverages: Juice. Sugar-sweetened soft drinks. Beer. Liquor and spirits.  Fats and oils: Butter. Canola oil. Vegetable oil. Beef fat (tallow). Lard.  Sweets and desserts: Cookies. Cakes. Pies. Candy.  Seasoning and other foods: Mayonnaise. Premade sauces and marinades.  The items listed may not be a complete list. Talk with your dietitian about what dietary choices are right for you. Summary  The Mediterranean diet includes both food and lifestyle choices.  Eat a variety of fresh fruits and vegetables, beans, nuts, seeds, and whole grains.  Limit the amount of red meat and sweets that you eat.  Talk with your health care provider about whether it is safe for you to drink red wine in moderation. This means 1 glass a day for nonpregnant women and 2 glasses a day for men. A glass of wine equals 5 oz (150 mL).  This information is not intended to replace advice given to you by your health care provider. Make sure you discuss any questions you have with your health care provider. Document Released: 06/11/2016 Document Revised: 07/14/2016 Document Reviewed: 06/11/2016 Elsevier Interactive Patient Education  2017 [employer].

## 2021-04-21 ENCOUNTER — Other Ambulatory Visit: Payer: Federal, State, Local not specified - PPO

## 2021-04-24 NOTE — Progress Notes (Signed)
[facility] Phone: [phone] Subjective:   I, Bethany [name] am serving as a Education administrator for Dr. Hulan Saas. This visit occurred during the SARS-CoV-2 public health emergency.  Safety protocols were in place, including screening questions prior to the visit, additional usage of staff PPE, and extensive cleaning of exam room while observing appropriate contact time as indicated for disinfecting solutions.   I'm seeing this patient by the request  of:  Denita Lung, MD  CC: Back, hip and neck pain  [id number]  Bethany [name] is a 65 y.o. female coming in with complaint of back and neck pain. OMT 01/07/2021. Patient states she is not doing well today. States about a month ago she was jogging and fell on her right side on the right hip. The pain is either her back or hip. Feels pain and numbness in the lateral leg starting at the hip.   Medications patient has been prescribed: None      Patient is an MRI of the brain is scheduled June 29 Patient has had an MRI of the lumbar spine done in December.  Independently visualized by me again today showing severe spinal canal stenosis at L4-L5, patient did have an epidural in January 2022.  Reviewed prior external information including notes and imaging from previsou exam, outside providers and external EMR if available.   As well as notes that were available from care everywhere and other healthcare systems.  Past medical history, social, surgical and family history all reviewed in electronic medical record.  No pertanent information unless stated regarding to the chief complaint.   Past Medical History:  Diagnosis Date   Anxiety    Arthritis    Cervical dysplasia 1990   CIN 2 subsequent cryosurgery. All Paps normal afterwards   Dyslipidemia    Herpes genitalis    Herpes labialis    Hypercholesteremia    IBS (irritable bowel syndrome)    Osteopenia 04/2014   T  score -1.7 FRAX 7.3%/0.8% stable from prior DEXA 2013   Raynaud disease    SBO (small bowel obstruction) (HCC) 10/02/2020    Allergies  Allergen Reactions   Morphine And Related Nausea Only     Review of Systems:  No headache, visual changes, nausea, vomiting, diarrhea, constipation, dizziness, abdominal pain, skin rash, fevers, chills, night sweats, weight loss, swollen lymph nodes, body aches, joint swelling, chest pain, shortness of breath, mood changes. POSITIVE muscle aches  Objective  There were no vitals taken for this visit.   General: No apparent distress alert and oriented x3 mood and affect normal, dressed appropriately.  HEENT: Pupils equal, extraocular movements intact  Respiratory: Patient's speak in full sentences and does not appear short of breath  Cardiovascular: No lower extremity edema, non tender, no erythema  Low back exam does have significant loss of lordosis.  Some may be even some degenerative scoliosis.  Severely tender to palpation mostly around the L4-L5 paraspinal musculature as well as the sacroiliac joint right greater than left.  Tightness with straight leg test but no true radicular symptoms but worsening pain even with extension of the back greater than 5 degrees. More pain with extension of the back.       Assessment and Plan:          The above documentation has been reviewed and is accurate and complete Lyndal Pulley, DO       Note: This dictation was prepared with  Dragon dictation along with smaller Company secretary. Any transcriptional errors that result from this process are unintentional.

## 2021-04-25 ENCOUNTER — Encounter: Payer: Self-pay | Admitting: Family Medicine

## 2021-04-25 ENCOUNTER — Ambulatory Visit: Payer: Federal, State, Local not specified - PPO | Admitting: Family Medicine

## 2021-04-25 ENCOUNTER — Ambulatory Visit (INDEPENDENT_AMBULATORY_CARE_PROVIDER_SITE_OTHER): Payer: Federal, State, Local not specified - PPO

## 2021-04-25 ENCOUNTER — Other Ambulatory Visit: Payer: Self-pay

## 2021-04-25 VITALS — BP 112/78 | HR 76 | Ht 65.0 in | Wt 114.0 lb

## 2021-04-25 DIAGNOSIS — M25551 Pain in right hip: Secondary | ICD-10-CM | POA: Diagnosis not present

## 2021-04-25 DIAGNOSIS — M2550 Pain in unspecified joint: Secondary | ICD-10-CM

## 2021-04-25 DIAGNOSIS — M5136 Other intervertebral disc degeneration, lumbar region: Secondary | ICD-10-CM | POA: Diagnosis not present

## 2021-04-25 MED ORDER — DULOXETINE HCL 20 MG PO CPEP: 20.0000 mg | ORAL_CAPSULE | Freq: Every day | ORAL | 3 refills | Status: DC

## 2021-04-25 MED ORDER — CELECOXIB 100 MG PO CAPS: 100.0000 mg | ORAL_CAPSULE | Freq: Two times a day (BID) | ORAL | 3 refills | Status: DC

## 2021-04-25 MED ORDER — KETOROLAC TROMETHAMINE 60 MG/2ML IM SOLN
30.0000 mg | Freq: Once | INTRAMUSCULAR | Status: AC
  Administered 2021-04-25: 30 mg via INTRAMUSCULAR

## 2021-04-25 MED ORDER — METHYLPREDNISOLONE ACETATE 40 MG/ML IJ SUSP
40.0000 mg | Freq: Once | INTRAMUSCULAR | Status: AC
  Administered 2021-04-25: 40 mg via INTRAMUSCULAR

## 2021-04-25 NOTE — Assessment & Plan Note (Signed)
History of degenerative disc disease.  I do think that it is fairly severe.  Patient is considering the possibility of surgery but wants to lengthen after October.  Today given low-dose of Cymbalta as well as given Celebrex to try.  Patient knows to discontinue any other anti-inflammatories.  Patient given a shot of Toradol and Depo-Medrol today to try to help.  Has muscle relaxers as well for breakthrough.  Patient will follow-up with me again in 5 to 6 weeks.  We will get x-rays of the hip secondary to some of the discomfort.  Patient has some very mild pain with internal rotation but overall negative fulcrum test today and minimal pain over the greater trochanteric area.  Patient does have worsening symptoms Medical attention immediately.

## 2021-04-25 NOTE — Patient Instructions (Addendum)
Good to see you Right hip xray Injection today Celebrex 100 mg once a day if no stomach issues take 2 If no better in a week call we will order an epidural Cymbalta 20 mg See me again in 5-6 weeks

## 2021-04-30 ENCOUNTER — Other Ambulatory Visit: Payer: Federal, State, Local not specified - PPO

## 2021-05-07 ENCOUNTER — Ambulatory Visit
Admission: RE | Admit: 2021-05-07 | Discharge: 2021-05-07 | Disposition: A | Discharge status: Home / Self Care | Payer: Federal, State, Local not specified - PPO | Source: Ambulatory Visit | Attending: Physician Assistant | Admitting: Physician Assistant

## 2021-05-07 ENCOUNTER — Other Ambulatory Visit: Payer: Self-pay

## 2021-05-07 DIAGNOSIS — R413 Other amnesia: Secondary | ICD-10-CM | POA: Diagnosis not present

## 2021-05-07 DIAGNOSIS — G319 Degenerative disease of nervous system, unspecified: Secondary | ICD-10-CM | POA: Diagnosis not present

## 2021-05-07 DIAGNOSIS — I6782 Cerebral ischemia: Secondary | ICD-10-CM | POA: Diagnosis not present

## 2021-05-08 NOTE — Progress Notes (Signed)
LMoVm for pt to call the office

## 2021-05-14 ENCOUNTER — Other Ambulatory Visit: Payer: Self-pay | Admitting: Family Medicine

## 2021-05-14 DIAGNOSIS — F1021 Alcohol dependence, in remission: Secondary | ICD-10-CM | POA: Diagnosis not present

## 2021-05-19 NOTE — Progress Notes (Signed)
LMoVm to call the office back.

## 2021-05-22 ENCOUNTER — Encounter: Payer: Self-pay | Admitting: Family Medicine

## 2021-05-22 ENCOUNTER — Telehealth: Payer: Self-pay | Admitting: Family Medicine

## 2021-05-22 ENCOUNTER — Ambulatory Visit: Payer: Federal, State, Local not specified - PPO | Admitting: Family Medicine

## 2021-05-22 VITALS — BP 122/78 | HR 71 | Temp 97.8°F | Wt 114.2 lb

## 2021-05-22 DIAGNOSIS — L237 Allergic contact dermatitis due to plants, except food: Secondary | ICD-10-CM | POA: Diagnosis not present

## 2021-05-22 MED ORDER — PREDNISONE 10 MG (21) PO TBPK: ORAL_TABLET | ORAL | 0 refills | Status: DC

## 2021-05-22 MED ORDER — METHYLPREDNISOLONE SODIUM SUCC 125 MG IJ SOLR
125.0000 mg | Freq: Once | INTRAMUSCULAR | Status: AC
  Administered 2021-05-22: 125 mg via INTRAMUSCULAR

## 2021-05-22 MED ORDER — PRAMOXINE HCL (PERIANAL) 1 % EX FOAM: 1.0000 "application " | Freq: Three times a day (TID) | CUTANEOUS | 0 refills | Status: DC | PRN

## 2021-05-22 NOTE — Telephone Encounter (Signed)
Pt left voicemail that she needs refill on Hydrocortisone 2.5% sent to  [facility]

## 2021-05-22 NOTE — Progress Notes (Signed)
   Subjective:    Patient ID: Bethany [name], female    DOB: May 02, 1956, 65 y.o.   MRN: [id number]  HPI She is here for consult concerning a rash.  She states that she thinks she got the rash from her dog who was playing in the woods.   Review of Systems     Objective:   Physical Exam Scattered erythematous lesions are noted on the left pelvic area, forearms and a small portion on her right cheek.       Assessment & Plan:  Poison oak dermatitis - Plan: predniSONE (STERAPRED UNI-PAK 21 TAB) 10 MG (21) TBPK tablet, methylPREDNISolone sodium succinate (SOLU-MEDROL) 125 mg/2 mL injection 125 mg Recommend cool compresses, cortisone cream and Benadryl at night.

## 2021-05-23 ENCOUNTER — Telehealth: Payer: Self-pay | Admitting: Physician Assistant

## 2021-05-23 NOTE — Telephone Encounter (Signed)
Pt called and given results MRI brain is without any acute findings. Mild cerebral atrophy related to age,but no masses, stroke, fluid or bleeding.

## 2021-05-23 NOTE — Telephone Encounter (Signed)
Patient called and left a message stating she is returning a call about MRI results.

## 2021-05-26 DIAGNOSIS — H2513 Age-related nuclear cataract, bilateral: Secondary | ICD-10-CM | POA: Diagnosis not present

## 2021-05-26 DIAGNOSIS — H02886 Meibomian gland dysfunction of left eye, unspecified eyelid: Secondary | ICD-10-CM | POA: Diagnosis not present

## 2021-05-26 DIAGNOSIS — H02883 Meibomian gland dysfunction of right eye, unspecified eyelid: Secondary | ICD-10-CM | POA: Diagnosis not present

## 2021-05-26 DIAGNOSIS — H02413 Mechanical ptosis of bilateral eyelids: Secondary | ICD-10-CM | POA: Diagnosis not present

## 2021-05-27 ENCOUNTER — Telehealth: Payer: Self-pay

## 2021-05-27 ENCOUNTER — Encounter: Payer: Self-pay | Admitting: Family Medicine

## 2021-05-27 DIAGNOSIS — K649 Unspecified hemorrhoids: Secondary | ICD-10-CM

## 2021-05-27 MED ORDER — HYDROCORTISONE (PERIANAL) 2.5 % EX CREA: 1.0000 "application " | TOPICAL_CREAM | Freq: Two times a day (BID) | CUTANEOUS | 0 refills | Status: DC

## 2021-05-27 MED ORDER — PRAMOXINE HCL (PERIANAL) 1 % EX FOAM: 1.0000 "application " | Freq: Three times a day (TID) | CUTANEOUS | 0 refills | Status: DC | PRN

## 2021-05-27 NOTE — Telephone Encounter (Signed)
Proctofoam not covered by insurance,  cash price [phone] with discount card still $95.  Called pharmacy for recommendations & they suggested Proctozone 2.5% cream, ok per Dr. Redmond School.  Left message for pt

## 2021-06-03 ENCOUNTER — Other Ambulatory Visit: Payer: Self-pay

## 2021-06-03 ENCOUNTER — Encounter: Payer: Self-pay | Admitting: Psychology

## 2021-06-03 ENCOUNTER — Ambulatory Visit (INDEPENDENT_AMBULATORY_CARE_PROVIDER_SITE_OTHER): Payer: Federal, State, Local not specified - PPO | Admitting: Psychology

## 2021-06-03 ENCOUNTER — Ambulatory Visit: Payer: Federal, State, Local not specified - PPO | Admitting: Psychology

## 2021-06-03 ENCOUNTER — Telehealth: Payer: Self-pay | Admitting: Family Medicine

## 2021-06-03 DIAGNOSIS — F1110 Opioid abuse, uncomplicated: Secondary | ICD-10-CM | POA: Insufficient documentation

## 2021-06-03 DIAGNOSIS — F332 Major depressive disorder, recurrent severe without psychotic features: Secondary | ICD-10-CM | POA: Diagnosis not present

## 2021-06-03 DIAGNOSIS — R41840 Attention and concentration deficit: Secondary | ICD-10-CM | POA: Diagnosis not present

## 2021-06-03 DIAGNOSIS — R4189 Other symptoms and signs involving cognitive functions and awareness: Secondary | ICD-10-CM

## 2021-06-03 DIAGNOSIS — F411 Generalized anxiety disorder: Secondary | ICD-10-CM | POA: Insufficient documentation

## 2021-06-03 DIAGNOSIS — F1111 Opioid abuse, in remission: Secondary | ICD-10-CM | POA: Insufficient documentation

## 2021-06-03 NOTE — Telephone Encounter (Signed)
Pt is coming in Thursday for a rash on her arms. She wanted to come today but you or [facility] does not have any openings today and she wanted to see if she could be worked in today

## 2021-06-03 NOTE — Progress Notes (Signed)
NEUROPSYCHOLOGICAL EVALUATION [facility] [place] Department of Neurology  Date of Evaluation: June 03, 2021  Reason for Referral:   DEANDRE BROWNFIELD is a 65 y.o. right-handed Caucasian female referred by  Sharene Butters, PA-C , to characterize her current cognitive functioning and assist with diagnostic clarity and treatment planning in the context of subjective cognitive decline and possible traits of ADHD.   Assessment and Plan:   Clinical Impression(s): Ms. Mays pattern of performance is suggestive of neuropsychological functioning within normal limits relative to age-matched peers. She did exhibit a relative weakness learning and later retrieving visual information (i.e., abstract shapes). However, performance across verbal memory measures was consistently strong and not suggestive of any formal memory impairment. It is possible that visual memory represents a longstanding weakness. Performance across all other cognitive domains were consistently appropriate. This included processing speed, attention/concentration, executive functioning, receptive and expressive language, and visuospatial abilities. Ms. Delcourt denied difficulties completing instrumental activities of daily living (ADLs) independently.   Medical records suggest a prior diagnosis of ADHD made during adulthood. Specific to ADHD, the absence of cognitive deficits should not be interpreted as absence of this condition as there is no pattern of performance across cognitive testing that is specific to ADHD. Individuals with ADHD can perform strongly in testing environments, likely due to the highly structured and distraction free setting in which testing commences. Across a childhood symptom questionnaire, responses were consistent with this diagnosis. At the very least, she demonstrates noticeable traits of this condition. Across mood-related questionnaires, she reported acute symptoms of severe anxiety and  severe depression. Overall, the most likely culprit of her subjective cognitive dysfunction is underlying traits of ADHD, made worse by severe psychiatric distress.   Specific to memory, Ms. Oum was able to learn novel verbal and visual information efficiently and retain this knowledge after lengthy delays. Overall, memory performance combined with intact performances across other areas of cognitive functioning is not suggestive of early-onset Alzheimer's disease. Likewise, her cognitive and behavioral profile is not suggestive of any other form of neurodegenerative illness presently.  Recommendations: A combination of medication and psychotherapy has been shown to be most effective at treating symptoms of anxiety and depression. It does not appear that these symptoms are being managed adequately at the present time. As such, Ms. Hubbs is encouraged to speak with her psychiatrist regarding medication adjustments to optimally manage these symptoms.   Likewise, Ms. Asaro is strongly encouraged to engage in short-term psychotherapy to address symptoms of psychiatric distress. This should be independent from any conversations she has with her psychiatrist. Despite her [place] sponsor reportedly being a Landscape architect, I also believe that working with a separate, fully independent therapist would be best to limit issues with multiple relationships. Meetings should be weekly at the onset given the severity of reported symptoms. Ms. Aliotta would benefit from an active and collaborative therapeutic environment, rather than one purely supportive in nature. Recommended treatment modalities include Cognitive Behavioral Therapy (CBT) or Acceptance and Commitment Therapy (ACT).  Ms. Freshley is encouraged to attend to lifestyle factors for brain health (e.g., regular physical exercise, good nutrition habits, regular participation in cognitively-stimulating activities, and general stress management techniques), which  are likely to have benefits for both emotional adjustment and cognition. In fact, in addition to promoting good general health, regular exercise incorporating aerobic activities (e.g., brisk walking, jogging, cycling, etc.) has been demonstrated to be a very effective treatment for depression and stress, with similar efficacy rates to both  antidepressant medication and psychotherapy. Optimal control of vascular risk factors (including safe cardiovascular exercise and adherence to dietary recommendations) is encouraged. Continued participation in activities which provide mental stimulation and social interaction is also recommended.   Memory can be improved using internal strategies such as rehearsal, repetition, chunking, mnemonics, association, and imagery. External strategies such as written notes in a consistently used memory journal, visual and nonverbal auditory cues such as a calendar on the refrigerator or appointments with alarm, such as on a cell phone, can also help maximize recall.    Given her greater difficulty learning and recalling visual information, she might benefit from verbalizing information to be retained. Additionally, when placed into a contextual organizational structure, her memory was improved. This suggests that when presenting her with information, it is valuable to place it in a meaningful context.  To address problems with fluctuating attention, she may wish to consider:   -Avoiding external distractions when needing to concentrate   -Limiting exposure to fast paced environments with multiple sensory demands   -Writing down complicated information and using checklists   -Attempting and completing one task at a time (i.e., no multi-tasking)   -Verbalizing aloud each step of a task to maintain focus   -Reducing the amount of information considered at one time  Review of Records:   Ms. Behmer was seen by [facility] Sharene Butters, PA-C) on 04/03/2021 for an evaluation  of memory loss. Ms. Creasman reported progressive short-term memory complaints, present for the past three years. Examples included repeating herself, forgetting appointments, and trouble recalling details of previous conversations. She reported that symptoms began after her knee replacement while taking strong pain medications. She acknowledged ongoing symptoms of depression and anxiety. She is followed by a psychiatrist and reported feeling that medications were beneficial. She denied hallucinations, paranoia, REM sleep behaviors, headaches, trauma, or injuries to the head, double vision, dizziness, focal numbness or tingling, unilateral weakness, or tremors. There is a history of alcohol abuse; said to be in remission for the past 20 years. ADLs were described as intact. Performance on a brief cognitive screening instrument (MOCA) was 26/30. Ultimately, Ms. Faulk was referred for a comprehensive neuropsychological evaluation to characterize her cognitive abilities and to assist with diagnostic clarity and treatment planning.   Brain MRI on 05/07/2021 revealed mild generalized atrophy and minimal small vessel ischemic changes.   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 All Paps normal afterwards   Degenerative disc disease, lumbar 05/02/2020   Dyslipidemia    Generalized anxiety disorder    Herpes genitalis    Herpes labialis    History of calcium pyrophosphate deposition disease (CPPD) 03/26/2020   History of opiate abuse, in remission    History of small bowel obstruction 04/17/2018   partial   Hyperlipidemia with target LDL less than 100 06/17/2011   IBS (irritable bowel syndrome)    Lumbago with sciatica, right side  02/10/2021   Major depressive disorder    Meibomian gland dysfunction (MGD) of both eyes 08/02/2020   Nonallopathic lesion of lumbosacral region 08/05/2017   Nonallopathic lesion of sacral region 08/05/2017   Nonallopathic lesion of thoracic region 08/05/2017   Osteopenia 04/2014   T score -1.7 FRAX 7.3%/0.8% stable from prior  DEXA 2013   Raynaud's disease 06/17/2011   Recovering alcoholic in remission     S/P LASIK (laser assisted in situ keratomileusis) 08/02/2020   right eye   Spondylolisthesis of lumbar region 02/10/2021   Status post total right knee replacement 10/31/2019   Tubular adenoma of colon 04/21/2016    Past Surgical History:  Procedure Laterality Date   AUGMENTATION MAMMAPLASTY     BREAST ENHANCEMENT SURGERY  2006   COSMETIC SURGERY     EYE SURGERY     lasik od   GYNECOLOGIC CRYOSURGERY  1990   LAPAROTOMY N/A 04/22/2018   Procedure: EXPLORATORY LAPAROTOMY;  Surgeon: Erroll Luna, MD;  Location: [facility];  Service: General;  Laterality: N/A;   LIGAMENT REPAIR Right 09/24/2015   Procedure: RIGHT INDEX METACARPAL PHALANGEAL RADIAL COLLATERAL LIGAMENT REPAIR;  Surgeon: Leanora Cover, MD;  Location: [facility];  Service: Orthopedics;  Laterality: Right;   MASS EXCISION N/A 08/04/2018   Procedure: REMOVAL OF SUTURE FROM ABDOMEN/ERAS PATHWAY;  Surgeon: Erroll Luna, MD;  Location: [facility];  Service: General;  Laterality: N/A;   OOPHORECTOMY     BSO   Right knee arthoscopy Right 09/09/2017   [facility]   SHX 1 Left 02/19/2020   basel cell carcinoma   SHX1 Right 12/11/2020   basal cell carinoma,micronodular pattern   SKIN BIOPSY Right 02/28/2019   Superficial basal carcinoma    TONSILLECTOMY AND ADENOIDECTOMY     TOTAL KNEE ARTHROPLASTY Right 04/12/2018   TOTAL KNEE ARTHROPLASTY Right 04/12/2018   Procedure: RIGHT TOTAL KNEE ARTHROPLASTY;  Surgeon: Melrose Nakayama, MD;  Location: [facility];  Service: Orthopedics;  Laterality: Right;    TUBAL LIGATION     UPPER GASTROINTESTINAL ENDOSCOPY  04/24/05   VAGINAL HYSTERECTOMY  2005   TVH BSO  adenomyosis    Current Outpatient Medications:    b complex vitamins tablet, Take 1 tablet by mouth daily., Disp: , Rfl:    bisacodyl (DULCOLAX) 5 MG EC tablet, Take 1 tablet (5 mg total) by mouth daily as needed for moderate constipation., Disp: 15 tablet, Rfl: 0   celecoxib (CELEBREX) 100 MG capsule, Take 1 capsule (100 mg total) by mouth 2 (two) times daily. One to 2 tablets by mouth daily as needed for pain., Disp: 60 capsule, Rfl: 3   cholecalciferol (VITAMIN D) 1000 UNITS tablet, Take 1,000 Units by mouth daily., Disp: , Rfl:    cycloSPORINE (RESTASIS) 0.05 % ophthalmic emulsion, INSTILL 1 DROP IN BOTH EYES TWICE DAILY, Disp: , Rfl:    docusate sodium (COLACE) 100 MG capsule, Take 1 capsule (100 mg total) by mouth 2 (two) times daily., Disp: 30 capsule, Rfl: 0   DULoxetine (CYMBALTA) 20 MG capsule, Take 1 capsule (20 mg total) by mouth daily. (Patient not taking: Reported on 05/22/2021), Disp: 30 capsule, Rfl: 3   gabapentin (NEURONTIN) 600 MG tablet, Take 1,200 mg by mouth 2 (two) times daily. , Disp: , Rfl:    hydrocortisone (PROCTOZONE-HC) 2.5 % rectal cream, Place 1 application rectally 2 (two) times daily., Disp: 30 g, Rfl: 0   ibuprofen (ADVIL,MOTRIN) 800 MG tablet, Take 1 tablet (800 mg total) by mouth every 8 (eight) hours as needed., Disp: 60 tablet, Rfl: 1   lamoTRIgine (LAMICTAL) 100 MG tablet, Take 100 mg by mouth 2 (two) times daily. , Disp: , Rfl: 0   LOTEMAX SM 0.38 % GEL, Apply 1 drop to eye 4 (four) times daily. (Patient not taking: Reported on 05/22/2021), Disp: , Rfl:  methocarbamol (ROBAXIN) 500 MG tablet, Take 1 tablet (500 mg total) by mouth every 6 (six) hours as needed for muscle spasms. (Patient not taking: Reported on 05/22/2021), Disp: 20 tablet, Rfl: 0   polyethylene glycol (MIRALAX) 17 g packet, Take 17 g by mouth daily. (Patient not taking: Reported on  05/22/2021), Disp: 14 each, Rfl: 0   predniSONE (STERAPRED UNI-PAK 21 TAB) 10 MG (21) TBPK tablet, Take as per manufacturer recommendation, Disp: 21 tablet, Rfl: 0   promethazine (PHENERGAN) 25 MG suppository, Place 1 suppository (25 mg total) rectally every 6 (six) hours as needed for nausea. (Patient not taking: Reported on 05/22/2021), Disp: 12 suppository, Rfl: 1   simvastatin (ZOCOR) 20 MG tablet, TAKE 1 TABLET(20 MG) BY MOUTH DAILY, Disp: 90 tablet, Rfl: 0   traZODone (DESYREL) 50 MG tablet, Take 50 mg by mouth at bedtime., Disp: , Rfl:    vitamin C (ASCORBIC ACID) 500 MG tablet, Take 500 mg by mouth daily., Disp: , Rfl:    zinc gluconate 1.5 mg/mL SOLN, , Disp: , Rfl:   Clinical Interview:   The following information was obtained during a clinical interview with Ms. Tumbleson prior to cognitive testing.  Cognitive Symptoms: Decreased short-term memory: Endorsed. Consistent with her medical records, she reported generalized short-term memory dysfunction, said to have progressively worsened during the past several years. Specific examples included trouble recalling details of previous conversations and names of individuals, as well as trouble recalling upcoming appointments.  Decreased long-term memory: Denied. Decreased attention/concentration: Endorsed. She reported a longstanding history of trouble with sustained attention, increased distractibility, losing her train of thought, and needing to re-read passages due to trouble focusing. Difficulties were said to have persisted since childhood and early adolescence. Per medical records, she was diagnosed with ADHD as an adult around 2019.  Reduced processing speed: Endorsed. Specifically, she reported it taking a while for her to retrieve information on demand.  Difficulties with executive functions: Endorsed. Specifically, she reported notable trouble with indecision, describing instances where she will second guess herself, ultimately change her  mind, and then spend time regretting that she changed her mind. Trouble with organization or impulsivity was denied. Overt personality changes were also denied.  Difficulties with emotion regulation: Denied. Difficulties with receptive language: Denied. Difficulties with word finding: Endorsed "sometimes."  Decreased visuoperceptual ability: Denied.  Difficulties completing ADLs: Denied.  Additional Medical History: History of traumatic brain injury/concussion: Denied. History of stroke: Denied. History of seizure activity: Denied. History of known exposure to toxins: Denied. Symptoms of chronic pain: Endorsed. She reported a history of spinal stenosis that will likely require surgery in the near future. Chronic back pain was reported and said to result in daily symptoms. Experience of frequent headaches/migraines: Denied. Frequent instances of dizziness/vertigo: Denied. She did report that dizziness "has happened" during the past year, generally when standing quickly.   Sensory changes: She wears glasses with positive effect. Other sensory changes/difficulties (i.e., hearing, taste, or smell) were denied.  Balance/coordination difficulties: Denied. Other motor difficulties: Largely denied. However, she did describe some tremors while performing fine motor actions like putting on mascara from time to time. Symptoms were potentially attributed to stress/anxiety.   Sleep History: Estimated hours obtained each night: 7 hours.  Difficulties falling asleep: Denied. Difficulties staying asleep: Endorsed. She reported often waking throughout the night and then having trouble falling back asleep due to an over-active mind.  Feels rested and refreshed upon awakening: Endorsed.  History of snoring: Endorsed. History of waking up gasping for  air: Denied. She did report that snoring has woken her up before, but not specifically due to breath cessation or gasping.  Witnessed breath cessation while  asleep: Denied.  History of vivid dreaming: Denied. Excessive movement while asleep: Denied. Instances of acting out her dreams: Denied.  Psychiatric/Behavioral Health History: Depression: Endorsed. She described her mood as "up and down" and acknowledged a history of depressive symptoms. She meets with her psychiatrist regularly, both for medication management and some reported therapy. She noted that current medications were generally helpful; however, she wondered if she was building up a tolerance. She further noted that her [place] sponsor is a Presenter, broadcasting. Current or remote suicidal ideation, intent, or plan was denied.  Anxiety: Endorsed. Anxiety appears to represent her most pressing psychiatric symptom. She described herself as a Patent attorney" and commonly has a "worst-case scenario" mindset. Lately, she noted that anxiety symptoms were quite significant. She did not provide a specific cause for this exacerbation.  Mania: Denied. Trauma History: Denied. Visual/auditory hallucinations: Denied. Delusional thoughts: Denied.  Tobacco: Denied. She reportedly quit when she was 89.  Alcohol: Ms. Koob acknowledged a history of alcohol abuse/dependence and did complete some formal treatment, including attending [place] meetings. She reported maintaining her sobriety for at least the past 18 years.  Recreational drugs: She acknowledged a history of prescription opiate abuse. Recently, she was prescribed these medications after having a knee replacement procedure, as well as another procedure to correct a small bowel obstruction. She noted that it is often very challenging for her to get off these medications. She stated that she had maintained her sobriety for the past eight months.   Family History: Problem Relation Age of Onset   Hypertension Mother    Dementia Mother Late 32s       rapid decline; concerns for Creutzfeldt-Jakob disease   Lymphoma Father 78   Breast cancer Sister 53   Colon  cancer Neg Hx    This information was confirmed by Ms. Araceli Bouche.  Academic/Vocational History: Highest level of educational attainment: 16 years. She graduated from high school and earned a Dietitian in recreation administration. She described herself as an average (B/C) student in academic settings. She noted that most subjects were challenging for her, emphasizing reading comprehension and speed as particular sources of frustration. She did utilize tutors throughout academic settings.  History of developmental delay: Denied. History of grade repetition: Denied. Enrollment in special education courses: Denied. History of LD: Denied.  Employment: Retired. 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   Evaluation Results:   Behavioral Observations: Ms. Birkle was unaccompanied, arrived to her appointment on time, and was appropriately dressed and groomed. She appeared alert and oriented. Observed gait and station were within normal limits. Gross motor functioning appeared intact upon informal observation and no abnormal movements (e.g., tremors) were noted. Her affect was generally relaxed and positive, but did range appropriately given the subject being discussed during the clinical interview or the task at hand during testing procedures. Spontaneous speech was fluent and word finding difficulties were not observed during the clinical interview. Thought processes were coherent, organized, and normal in content. Insight into her cognitive difficulties appeared adequate. During testing, sustained attention was appropriate. Task engagement was adequate and she persisted when challenged. Overall, Ms. Fejes was cooperative with the clinical interview and subsequent testing procedures.   Adequacy of Effort: The validity of neuropsychological testing is limited by the extent to  which the individual being tested may be assumed to have  exerted adequate effort during testing. Ms. Caires expressed her intention to perform to the best of her abilities and exhibited adequate task engagement and persistence. Scores across stand-alone and embedded performance validity measures were within expectation. As such, the results of the current evaluation are believed to be a valid representation of Ms. Lybarger current cognitive functioning.  Test Results: Ms. Mathiasen was fully oriented at the time of the current evaluation.  Intellectual abilities based upon educational and vocational attainment were estimated to be in the average range. Premorbid abilities were estimated to be within the average range based upon a single-word reading test.   Processing speed was average to above average. Basic attention was exceptionally high. More complex attention (e.g., working memory) was average. While performance on a continuous performance test did suggest a reduction in response speed during later portions of the assessment, it overall did not suggest the presence of a disorder characterized by attentional dysregulation such as ADHD. Executive functioning was average to above average.  While not directly assessed, receptive language abilities were believed to be intact. Likewise, Ms. Nissley did not exhibit any difficulties comprehending task instructions and answered all questions asked of her appropriately. Assessed expressive language (e.g., verbal fluency and confrontation naming) was below average to average.     Assessed visuospatial/visuoconstructional abilities were average to exceptionally high.    Learning (i.e., encoding) of novel verbal and visual information was below average across a shape learning task but average to above average across verbal measures. Spontaneous delayed recall (i.e., retrieval) of previously learned information was well below average on a visual task but average to above average across verbal measures. Retention rates  were 91% across a story learning task, 78% across a list learning task, and 60% across a shape learning task. Performance across recognition tasks was average to above average, suggesting evidence for information consolidation.   Results of emotional screening instruments suggested that recent symptoms of generalized anxiety were in the severe range, while symptoms of depression were also within the severe range. A screening instrument assessing recent sleep quality suggested the presence of minimal sleep dysfunction. Across a questionnaire assessing self-reported ADHD symptoms during childhood, she scored in the "likely to have ADHD" range across symptoms of inattention and in the "highly likely to have ADHD" range across symptoms of hyperactivity/impulsivity.   Tables of Scores:   Note: This summary of test scores accompanies the interpretive report and should not be considered in isolation without reference to the appropriate sections in the text. Descriptors are based on appropriate normative data and may be adjusted based on clinical judgment. Terms such as "Within Normal Limits" and "Outside Normal Limits" are used when a more specific description of the test score cannot be determined.       Percentile - Normative Descriptor > 98 - Exceptionally High 91-97 - Well Above Average 75-90 - Above Average 25-74 - Average 9-24 - Below Average 2-8 - Well Below Average < 2 - Exceptionally Low       Validity:   DESCRIPTOR       ACS Word Choice: --- --- Within Normal Limits  Dot Counting Test: --- --- Within Normal Limits  NAB EVI: --- --- Within Normal Limits  D-KEFS Color Word Effort Index: --- --- Within Normal Limits       Orientation:      Raw Score Percentile   NAB Orientation, Form 1 29/29 --- ---  Cognitive Screening:      Raw Score Percentile   SLUMS: 27/30 --- ---       Intellectual Functioning:      Standard Score Percentile   [place]ona Formula Estimated Premorbid IQ 112 79  Above Average        Standard Score Percentile   Test of Premorbid Functioning: 103 58 Average       Memory:     NAB Memory Module, Form 1: Standard Score/ T Score Percentile   Total Memory Index 91 27 Average  List Learning       Total Trials 1-3 22/36 (43) 25 Average    List B 7/12 (60) 84 Above Average    Short Delay Free Recall 9/12 (53) 62 Average    Long Delay Free Recall 7/12 (45) 31 Average    Retention Percentage 78 (43) 25 Average    Recognition Discriminability 8 (52) 58 Average  Shape Learning       Total Trials 1-3 13/27 (40) 16 Below Average    Delayed Recall 3/9 (29) 2 Well Below Average    Retention Percentage 60 (38) 12 Below Average    Recognition Discriminability 7 (52) 58 Average  Story Learning       Immediate Recall 66/80 (50) 50 Average    Delayed Recall 31/40 (43) 25 Average    Retention Percentage 91 (51) 54 Average  Daily Living Memory       Immediate Recall 47/51 (57) 75 Above Average    Delayed Recall 16/17 (59) 82 Above Average    Retention Percentage 94 (55) 69 Average    Recognition Hits 10/10 (60) 84 Above Average       Attention/Executive Function:     Trail Making Test (TMT): Raw Score (T Score) Percentile     Part A 31 secs.,  0 errors (50) 50 Average    Part B 82 secs.,  0 errors (47) 38 Average         Scaled Score Percentile   WAIS-IV Coding: 10 50 Average       NAB Attention Module, Form 1: T Score Percentile     Digits Forward 71 98 Exceptionally High    Digits Backwards 50 50 Average       Conners CPT 3: T Score Percentile     d' 41 18 Low    Omissions 44 27 Low    Commissions 47 38 Average    Perseverations 47 38 Average    HRT 53 62 Average    HRT SD 48 42 Average    Variability 37 9 Low        Scaled Score Percentile   WAIS-IV Similarities: 9 37 Average       D-KEFS Color-Word Interference Test: Raw Score (Scaled Score) Percentile     Color Naming 29 secs. (12) 75 Above Average    Word Reading 20 secs. (12) 75  Above Average    Inhibition 55 secs. (12) 75 Above Average      Total Errors 0 errors (12) 75 Above Average    Inhibition/Switching 61 secs. (12) 75 Above Average      Total Errors 6 errors (7) 16 Below Average       Language:     Verbal Fluency Test: Raw Score (T Score) Percentile     Phonemic Fluency (FAS) 35 (42) 21 Below Average    Animal Fluency 22 (53) 62 Average        NAB Language Module, Form 1: T  Score Percentile     Naming 31/31 (56) 73 Average       Visuospatial/Visuoconstruction:      Raw Score Percentile   Clock Drawing: 10/10 --- Within Normal Limits       NAB Spatial Module, Form 1: T Score Percentile     Figure Drawing Copy 74 99 Exceptionally High        Scaled Score Percentile   WAIS-IV Block Design: 8 25 Average       Mood and Personality:      Raw Score Percentile   Beck Depression Inventory - II: 37 --- Severe  PROMIS Anxiety Questionnaire: 29 --- Severe       Additional Questionnaires:     Adult Self-Report Scale (Childhood): Raw Score Percentile     Inattention 20 --- Likely to have ADHD    Hyperactive/Impulsive 25 --- Highly likely to have ADHD       PROMIS Sleep Disturbance Questionnaire: 22 --- None to Slight   Informed Consent and Coding/Compliance:   The current evaluation represents a clinical evaluation for the purposes previously outlined by the referral source and is in no way reflective of a forensic evaluation.   Ms. Repko was provided with a verbal description of the nature and purpose of the present neuropsychological evaluation. Also reviewed were the foreseeable risks and/or discomforts and benefits of the procedure, limits of confidentiality, and mandatory reporting requirements of this provider. The patient was given the opportunity to ask questions and receive answers about the evaluation. Oral consent to participate was provided by the patient.   This evaluation was conducted by Christia Reading, Ph.D., licensed clinical  neuropsychologist. Ms. Zoda completed a clinical interview with Dr. Melvyn Novas, billed as one unit [phone], and 140 minutes of cognitive testing and scoring, billed as one unit [phone] and four additional units 96139. Psychometrist Milana Kidney, B.S., assisted Dr. Melvyn Novas with test administration and scoring procedures. As a separate and discrete service, Dr. Melvyn Novas spent a total of 160 minutes in interpretation and report writing billed as one unit [phone] and two units 96133.

## 2021-06-03 NOTE — Progress Notes (Signed)
   Psychometrician Note   Cognitive testing was administered to Lestine Box by Milana Kidney, B.S. (psychometrist) under the supervision of Dr. Christia Reading, Ph.D., licensed psychologist on 06/03/21. Ms. Klecha did not appear overtly distressed by the testing session per behavioral observation or responses across self-report questionnaires. Rest breaks were offered.    The battery of tests administered was selected by Dr. Christia Reading, Ph.D. with consideration to Ms. Retz's current level of functioning, the nature of her symptoms, emotional and behavioral responses during interview, level of literacy, observed level of motivation/effort, and the nature of the referral question. This battery was communicated to the psychometrist. Communication between Dr. Christia Reading, Ph.D. and the psychometrist was ongoing throughout the evaluation and Dr. Christia Reading, Ph.D. was immediately accessible at all times. Dr. Christia Reading, Ph.D. provided supervision to the psychometrist on the date of this service to the extent necessary to assure the quality of all services provided.    OLLIVIA FAUBERT will return within approximately 1-2 weeks for an interactive feedback session with Dr. Melvyn Novas at which time her test performances, clinical impressions, and treatment recommendations will be reviewed in detail. Ms. Numan understands she can contact our office should she require our assistance before this time.  A total of 140 minutes of billable time were spent face-to-face with Ms. Leverton by the psychometrist. This includes both test administration and scoring time. Billing for these services is reflected in the clinical report generated by Dr. Christia Reading, Ph.D.  This note reflects time spent with the psychometrician and does not include test scores or any clinical interpretations made by Dr. Melvyn Novas. The full report will follow in a separate note.

## 2021-06-04 ENCOUNTER — Ambulatory Visit: Payer: Federal, State, Local not specified - PPO | Admitting: Family Medicine

## 2021-06-05 ENCOUNTER — Ambulatory Visit: Payer: Federal, State, Local not specified - PPO | Admitting: Family Medicine

## 2021-06-06 ENCOUNTER — Ambulatory Visit: Payer: Federal, State, Local not specified - PPO | Admitting: Family Medicine

## 2021-06-17 DIAGNOSIS — F1121 Opioid dependence, in remission: Secondary | ICD-10-CM | POA: Diagnosis not present

## 2021-06-24 ENCOUNTER — Encounter: Payer: Self-pay | Admitting: Family Medicine

## 2021-06-24 ENCOUNTER — Other Ambulatory Visit: Payer: Self-pay

## 2021-06-24 ENCOUNTER — Ambulatory Visit: Payer: Federal, State, Local not specified - PPO | Admitting: Family Medicine

## 2021-06-24 VITALS — BP 100/60 | HR 83 | Ht 65.0 in | Wt 114.0 lb

## 2021-06-24 DIAGNOSIS — M999 Biomechanical lesion, unspecified: Secondary | ICD-10-CM | POA: Diagnosis not present

## 2021-06-24 DIAGNOSIS — M5136 Other intervertebral disc degeneration, lumbar region: Secondary | ICD-10-CM

## 2021-06-24 DIAGNOSIS — M51369 Other intervertebral disc degeneration, lumbar region without mention of lumbar back pain or lower extremity pain: Secondary | ICD-10-CM

## 2021-06-24 DIAGNOSIS — M2550 Pain in unspecified joint: Secondary | ICD-10-CM

## 2021-06-24 DIAGNOSIS — M4316 Spondylolisthesis, lumbar region: Secondary | ICD-10-CM | POA: Diagnosis not present

## 2021-06-24 LAB — COMPREHENSIVE METABOLIC PANEL
ALT: 23 U/L (ref 0–35)
AST: 34 U/L (ref 0–37)
Albumin: 4.5 g/dL (ref 3.5–5.2)
Alkaline Phosphatase: 73 U/L (ref 39–117)
BUN: 23 mg/dL (ref 6–23)
CO2: 32 mEq/L (ref 19–32)
Calcium: 9.9 mg/dL (ref 8.4–10.5)
Chloride: 101 mEq/L (ref 96–112)
Creatinine, Ser: 0.94 mg/dL (ref 0.40–1.20)
GFR: 63.62 mL/min (ref >60.00)
Glucose, Bld: 112 mg/dL — ABNORMAL HIGH (ref 70–99)
Potassium: 3.5 mEq/L (ref 3.5–5.1)
Sodium: 140 mEq/L (ref 135–145)
Total Bilirubin: 0.4 mg/dL (ref 0.2–1.2)
Total Protein: 7.2 g/dL (ref 6.0–8.3)

## 2021-06-24 MED ORDER — KETOROLAC TROMETHAMINE 30 MG/ML IJ SOLN
30.0000 mg | Freq: Once | INTRAMUSCULAR | Status: AC
  Administered 2021-06-24: 30 mg via INTRAMUSCULAR

## 2021-06-24 MED ORDER — PREDNISONE 20 MG PO TABS
20.0000 mg | ORAL_TABLET | Freq: Every day | ORAL | 0 refills | Status: DC
Start: 2021-06-24 — End: 2021-08-27

## 2021-06-24 MED ORDER — METHYLPREDNISOLONE ACETATE 40 MG/ML IJ SUSP
40.0000 mg | Freq: Once | INTRAMUSCULAR | Status: AC
  Administered 2021-06-24: 40 mg via INTRAMUSCULAR

## 2021-06-24 NOTE — Patient Instructions (Addendum)
Good to see you  You are always so good ot talk to  Happy anniversary  Stay active 2 injections today to help  Prednisone daily for 7 days only if you need it for the trip but stop celebrex if you take it Try to take the celebrex '200mg'$  in daytime and '100mg'$  at night but watch for any stomach pain ans stop immediately  See me again after the rip

## 2021-06-24 NOTE — Assessment & Plan Note (Addendum)
Degenerative disc disease of the lumbar spine that is severe with median nerve impingement.  Patient will be having a surgical intervention but would like to wait until October.  Discussed different medications including gabapentin still.  Given a shot of Toradol and Depo-Medrol.  Secondary to patient having some mild elevation in creatinine and lower GFR with patient being on the Celebrex.  Like to recheck patient's kidney functions.  Patient was unable to tolerate the duloxetine.  Follow-up with me again in 6 weeks before patient goes on her anniversary trip.

## 2021-06-24 NOTE — Progress Notes (Signed)
[name] [place] Phone: [phone] Subjective:   Rito Ehrlich, am serving as a scribe for Dr. Hulan Saas.  I'm seeing this patient by the request  of:  Denita Lung, MD  CC: hip pain   [id number]  Bethany [name] is a 65 y.o. female coming in with complaint of back and neck pain was seen and was having more right hip pain.  X-rays were unremarkable.  He does have known degenerative disc disease of the lumbar spine.  Given a low-dose of Cymbalta as well as given Celebrex.  Patient was having exacerbation and given Toradol and Depo-Medrol injections at last exam.  Patient states that the hip is doing well but the low back  is still bothering her.         Hip xray showed only mild OA.  This was taken in June independently visualized by me.  Reviewed prior external information including notes and imaging from previsou exam, outside providers and external EMR if available.   As well as notes that were available from care everywhere and other healthcare systems.  Past medical history, social, surgical and family history all reviewed in electronic medical record.  No pertanent information unless stated regarding to the chief complaint.   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 All Paps normal afterwards   Degenerative disc disease, lumbar 05/02/2020   Dyslipidemia    Generalized anxiety disorder    Herpes genitalis    Herpes labialis    History of calcium pyrophosphate deposition disease (CPPD) 03/26/2020   History of opiate abuse, in remission    History of small bowel obstruction 04/17/2018   partial   Hyperlipidemia with  target LDL less than 100 06/17/2011   IBS (irritable bowel syndrome)    Lumbago with sciatica, right side 02/10/2021   Major depressive disorder    Meibomian gland dysfunction (MGD) of both eyes 08/02/2020   Nonallopathic lesion of lumbosacral region 08/05/2017   Nonallopathic lesion of sacral region 08/05/2017   Nonallopathic lesion of thoracic region 08/05/2017   Osteopenia 04/2014   T score -1.7 FRAX 7.3%/0.8% stable from prior DEXA 2013   Raynaud's disease 06/17/2011   Recovering alcoholic in remission     S/P LASIK (laser assisted in situ keratomileusis) 08/02/2020   right eye   Spondylolisthesis of lumbar region 02/10/2021   Status post total right knee replacement 10/31/2019   Tubular adenoma of colon 04/21/2016    Allergies  Allergen Reactions   Morphine And Related Nausea Only     Review of Systems:  No headache, visual changes, nausea, vomiting, diarrhea, constipation, dizziness, abdominal pain, skin rash, fevers, chills, night sweats, weight loss, swollen lymph nodes, body aches, joint swelling, chest pain, shortness of breath, mood changes. POSITIVE muscle aches  Objective  Blood pressure 100/60, pulse 83, height '5\' 5"'$  (1.651 m), weight 114 lb (51.7 kg), SpO2 99 %.   General: No apparent distress alert and oriented x3 mood and affect normal, dressed appropriately.  HEENT: Pupils equal, extraocular movements intact  Respiratory: Patient's speak in full sentences and does not appear short of breath  Cardiovascular: No lower extremity edema, non tender, no erythema  Gait normal  with good balance and coordination.  MSK:  Non tender with full range of motion and good stability and symmetric strength and tone of shoulders, elbows, wrist, hip, knee and ankles bilaterally.  Back -low back exam still has significant loss of lordosis.  Tightness with FABER test bilaterally.  Patient does have worsening pain with any type of extension of the back noted.       Assessment and  Plan:       The above documentation has been reviewed and is accurate and complete Lyndal Pulley, DO        Note: This dictation was prepared with Dragon dictation along with smaller phrase technology. Any transcriptional errors that result from this process are unintentional.

## 2021-06-24 NOTE — Assessment & Plan Note (Addendum)
Patient does have spondylolisthesis and likely contributing to more of a low back pain.  Will be having surgery at a different time.  Toradol and Depo-Medrol given today.  Held on any of the osteopathic manipulation secondary to not having significant progress.  We discussed the Celebrex and potentially increasing but would like to check her GFR again.  Patient 3 months ago did have some mild elevation.  Depending on this we will see if patient has room to potentially increase or if we need to continue to monitor.  Patient has been on the Celebrex.  This I told her to hold for the next 24 hours with patient having the Toradol injection today as well.  Patient given prednisone to use for a short-term when she is on her trip in case she does have some more discomfort and pain.  Follow-up with me again right before her trip in 5 to 6 weeks to see how she is doing.

## 2021-06-25 ENCOUNTER — Encounter: Payer: Federal, State, Local not specified - PPO | Admitting: Psychology

## 2021-06-30 ENCOUNTER — Ambulatory Visit (INDEPENDENT_AMBULATORY_CARE_PROVIDER_SITE_OTHER): Payer: Federal, State, Local not specified - PPO | Admitting: Psychology

## 2021-06-30 DIAGNOSIS — R41840 Attention and concentration deficit: Secondary | ICD-10-CM

## 2021-06-30 NOTE — Progress Notes (Signed)
   Neuropsychology [place] of Neurology     Ms. Bethany [name] was unable to attend her feedback appointment this morning due to her father becoming ill. She was rescheduled for 07/30/2021 at 10:00am. Her report will be mailed to her in the interim.

## 2021-07-02 DIAGNOSIS — F1121 Opioid dependence, in remission: Secondary | ICD-10-CM | POA: Diagnosis not present

## 2021-07-10 ENCOUNTER — Other Ambulatory Visit: Payer: Self-pay | Admitting: Neurosurgery

## 2021-07-17 ENCOUNTER — Encounter: Payer: Self-pay | Admitting: Family Medicine

## 2021-07-17 DIAGNOSIS — F1121 Opioid dependence, in remission: Secondary | ICD-10-CM | POA: Diagnosis not present

## 2021-07-17 DIAGNOSIS — F502 Bulimia nervosa: Secondary | ICD-10-CM | POA: Diagnosis not present

## 2021-07-17 DIAGNOSIS — F411 Generalized anxiety disorder: Secondary | ICD-10-CM | POA: Diagnosis not present

## 2021-07-18 MED ORDER — CELECOXIB 200 MG PO CAPS: 200.0000 mg | ORAL_CAPSULE | Freq: Two times a day (BID) | ORAL | 1 refills | Status: DC

## 2021-07-21 ENCOUNTER — Ambulatory Visit: Payer: Federal, State, Local not specified - PPO | Admitting: Physician Assistant

## 2021-07-21 ENCOUNTER — Other Ambulatory Visit (INDEPENDENT_AMBULATORY_CARE_PROVIDER_SITE_OTHER): Payer: Federal, State, Local not specified - PPO

## 2021-07-21 ENCOUNTER — Other Ambulatory Visit: Payer: Self-pay

## 2021-07-21 DIAGNOSIS — Z23 Encounter for immunization: Secondary | ICD-10-CM

## 2021-07-30 ENCOUNTER — Ambulatory Visit (INDEPENDENT_AMBULATORY_CARE_PROVIDER_SITE_OTHER): Payer: Federal, State, Local not specified - PPO | Admitting: Psychology

## 2021-07-30 ENCOUNTER — Other Ambulatory Visit: Payer: Self-pay

## 2021-07-30 DIAGNOSIS — R41840 Attention and concentration deficit: Secondary | ICD-10-CM

## 2021-07-30 DIAGNOSIS — F332 Major depressive disorder, recurrent severe without psychotic features: Secondary | ICD-10-CM

## 2021-07-30 DIAGNOSIS — F411 Generalized anxiety disorder: Secondary | ICD-10-CM

## 2021-07-30 NOTE — Progress Notes (Signed)
   Neuropsychology Feedback Session [place] [place] [facility] of Neurology  Reason for Referral:   Bethany [name] is a 65 y.o. right-handed Caucasian female referred by  Sharene Butters, PA-C , to characterize her current cognitive functioning and assist with diagnostic clarity and treatment planning in the context of subjective cognitive decline and possible traits of ADHD.   Feedback:   Bethany [name] completed a comprehensive neuropsychological evaluation on 06/03/2021. Please refer to that encounter for the full report and recommendations. Briefly, results suggested neuropsychological functioning within normal limits relative to age-matched peers. She did exhibit a relative weakness learning and later retrieving visual information (i.e., abstract shapes). However, performance across verbal memory measures was consistently strong and not suggestive of any formal memory impairment. It is possible that visual memory represents a longstanding weakness. Performance across all other cognitive domains were consistently appropriate. Overall, the most likely culprit of her subjective cognitive dysfunction is underlying traits of ADHD, made worse by severe psychiatric distress.  Bethany [name] was unaccompanied during the current feedback session. Content of the current session focused on the results of her neuropsychological evaluation. Bethany [name] was given the opportunity to ask questions and her questions were answered. She was encouraged to reach out should additional questions arise. A copy of her report was previously mailed to her.     Less than 16 minutes were spent conducting the current feedback session with Bethany [name].

## 2021-07-31 NOTE — Progress Notes (Deleted)
[place] Phone: [phone] Subjective:    I'm seeing this patient by the request  of:  Denita Lung, MD  CC:   [id number]  Bethany [name] is a 66 y.o. female coming in with complaint of back and neck pain. OMT 06/24/2021.  Patient states   Medications patient has been prescribed: Prednisone, Celebrex, Cymbalta  Taking:         Reviewed prior external information including notes and imaging from previsou exam, outside providers and external EMR if available.   As well as notes that were available from care everywhere and other healthcare systems.  Past medical history, social, surgical and family history all reviewed in electronic medical record.  No pertanent information unless stated regarding to the chief complaint.   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 All Paps normal afterwards   Degenerative disc disease, lumbar 05/02/2020   Dyslipidemia    Generalized anxiety disorder    Herpes genitalis    Herpes labialis    History of calcium pyrophosphate deposition disease (CPPD) 03/26/2020   History of opiate abuse, in remission    History of small bowel obstruction 04/17/2018   partial   Hyperlipidemia with target LDL less than 100 06/17/2011   IBS (irritable bowel syndrome)    Lumbago with sciatica, right side 02/10/2021   Major depressive disorder    Meibomian gland dysfunction (MGD) of both eyes 08/02/2020   Nonallopathic lesion of lumbosacral region 08/05/2017   Nonallopathic lesion of sacral region 08/05/2017   Nonallopathic lesion of thoracic region 08/05/2017   Osteopenia 04/2014   T score -1.7 FRAX  7.3%/0.8% stable from prior DEXA 2013   Raynaud's disease 06/17/2011   Recovering alcoholic in remission     S/P LASIK (laser assisted in situ keratomileusis) 08/02/2020   right eye   Spondylolisthesis of lumbar region 02/10/2021   Status post total right knee replacement 10/31/2019   Tubular adenoma of colon 04/21/2016    Allergies  Allergen Reactions   Morphine And Related Nausea Only     Review of Systems:  No headache, visual changes, nausea, vomiting, diarrhea, constipation, dizziness, abdominal pain, skin rash, fevers, chills, night sweats, weight loss, swollen lymph nodes, body aches, joint swelling, chest pain, shortness of breath, mood changes. POSITIVE muscle aches  Objective  There were no vitals taken for this visit.   General: No apparent distress alert and oriented x3 mood and affect normal, dressed appropriately.  HEENT: Pupils equal, extraocular movements intact  Respiratory: Patient's speak in full sentences and does not appear short of breath  Cardiovascular: No lower extremity edema, non tender, no erythema  Neuro: Cranial nerves II through XII are intact, neurovascularly intact in all extremities with 2+ DTRs and 2+ pulses.  Gait normal with good balance and coordination.  MSK:  Non tender with full range of motion and good stability and symmetric strength and tone of shoulders, elbows, wrist, hip, knee and ankles bilaterally.  Back - Normal skin, Spine with normal alignment and no deformity.  No tenderness to vertebral process palpation.  Paraspinous muscles are not tender and without spasm.   Range of motion is full at neck and lumbar sacral  regions  Osteopathic findings  C2 flexed rotated and side bent right C6 flexed rotated and side bent left T3 extended rotated and side bent right inhaled rib T9 extended rotated and side bent left L2 flexed rotated and side bent right Sacrum right on right       Assessment and Plan:    Nonallopathic  problems  Decision today to treat with OMT was based on Physical Exam  After verbal consent patient was treated with HVLA, ME, FPR techniques in cervical, rib, thoracic, lumbar, and sacral  areas  Patient tolerated the procedure well with improvement in symptoms  Patient given exercises, stretches and lifestyle modifications  See medications in patient instructions if given  Patient will follow up in 4-8 weeks      The above documentation has been reviewed and is accurate and complete Jacqualin Combes       Note: This dictation was prepared with Dragon dictation along with smaller phrase technology. Any transcriptional errors that result from this process are unintentional.

## 2021-08-05 ENCOUNTER — Ambulatory Visit: Payer: Federal, State, Local not specified - PPO | Admitting: Family Medicine

## 2021-08-13 ENCOUNTER — Encounter: Payer: Self-pay | Admitting: Gastroenterology

## 2021-08-18 ENCOUNTER — Encounter: Payer: Self-pay | Admitting: Family Medicine

## 2021-08-18 DIAGNOSIS — L814 Other melanin hyperpigmentation: Secondary | ICD-10-CM | POA: Diagnosis not present

## 2021-08-18 DIAGNOSIS — C44729 Squamous cell carcinoma of skin of left lower limb, including hip: Secondary | ICD-10-CM | POA: Diagnosis not present

## 2021-08-18 DIAGNOSIS — Z85828 Personal history of other malignant neoplasm of skin: Secondary | ICD-10-CM | POA: Diagnosis not present

## 2021-08-18 DIAGNOSIS — L570 Actinic keratosis: Secondary | ICD-10-CM | POA: Diagnosis not present

## 2021-08-18 DIAGNOSIS — D225 Melanocytic nevi of trunk: Secondary | ICD-10-CM | POA: Diagnosis not present

## 2021-08-18 DIAGNOSIS — L821 Other seborrheic keratosis: Secondary | ICD-10-CM | POA: Diagnosis not present

## 2021-08-18 NOTE — Telephone Encounter (Signed)
Patient is on wait list. Left Vm 10/17 2pm to offer cancellation.

## 2021-08-19 ENCOUNTER — Ambulatory Visit: Payer: Federal, State, Local not specified - PPO | Admitting: Physician Assistant

## 2021-08-19 NOTE — Telephone Encounter (Signed)
Left 2nd VM for pt to offer cancellations.

## 2021-08-20 ENCOUNTER — Encounter: Payer: Self-pay | Admitting: Family Medicine

## 2021-08-20 DIAGNOSIS — F1121 Opioid dependence, in remission: Secondary | ICD-10-CM | POA: Diagnosis not present

## 2021-08-22 DIAGNOSIS — M4316 Spondylolisthesis, lumbar region: Secondary | ICD-10-CM | POA: Diagnosis not present

## 2021-08-22 DIAGNOSIS — R030 Elevated blood-pressure reading, without diagnosis of hypertension: Secondary | ICD-10-CM | POA: Diagnosis not present

## 2021-08-22 DIAGNOSIS — Z681 Body mass index (BMI) 19 or less, adult: Secondary | ICD-10-CM | POA: Diagnosis not present

## 2021-08-23 ENCOUNTER — Encounter: Payer: Self-pay | Admitting: Family Medicine

## 2021-08-25 ENCOUNTER — Other Ambulatory Visit: Payer: Self-pay

## 2021-08-25 ENCOUNTER — Ambulatory Visit (INDEPENDENT_AMBULATORY_CARE_PROVIDER_SITE_OTHER): Payer: Federal, State, Local not specified - PPO

## 2021-08-25 DIAGNOSIS — Z23 Encounter for immunization: Secondary | ICD-10-CM | POA: Diagnosis not present

## 2021-08-26 ENCOUNTER — Encounter (HOSPITAL_COMMUNITY)
Admission: RE | Admit: 2021-08-26 | Discharge: 2021-08-26 | Disposition: A | Discharge status: Home / Self Care | Payer: Federal, State, Local not specified - PPO | Source: Ambulatory Visit | Attending: Neurological Surgery | Admitting: Neurological Surgery

## 2021-08-26 ENCOUNTER — Other Ambulatory Visit: Payer: Self-pay

## 2021-08-26 ENCOUNTER — Encounter: Payer: Self-pay | Admitting: Family Medicine

## 2021-08-26 ENCOUNTER — Encounter (HOSPITAL_COMMUNITY): Payer: Self-pay

## 2021-08-26 VITALS — BP 145/74 | HR 78 | Temp 98.0°F | Resp 17 | Ht 65.0 in | Wt 114.2 lb

## 2021-08-26 DIAGNOSIS — Z01818 Encounter for other preprocedural examination: Secondary | ICD-10-CM

## 2021-08-26 DIAGNOSIS — Z01812 Encounter for preprocedural laboratory examination: Secondary | ICD-10-CM | POA: Insufficient documentation

## 2021-08-26 LAB — TYPE AND SCREEN
ABO/RH(D): O POS
Antibody Screen: NEGATIVE

## 2021-08-26 LAB — CBC
HCT: 38.8 % (ref 36.0–46.0)
Hemoglobin: 12.8 g/dL (ref 12.0–15.0)
MCH: 33.9 pg (ref 26.0–34.0)
MCHC: 33 g/dL (ref 30.0–36.0)
MCV: 102.6 fL — ABNORMAL HIGH (ref 80.0–100.0)
Platelets: 201 10*3/uL (ref 150–400)
RBC: 3.78 MIL/uL — ABNORMAL LOW (ref 3.87–5.11)
RDW: 12 % (ref 11.5–15.5)
WBC: 8.5 10*3/uL (ref 4.0–10.5)
nRBC: 0 % (ref 0.0–0.2)

## 2021-08-26 LAB — SURGICAL PCR SCREEN
MRSA, PCR: NEGATIVE
Staphylococcus aureus: NEGATIVE

## 2021-08-26 NOTE — Progress Notes (Signed)
PCP - Jill Alexanders Cardiologist - denies Sports Medicine: Charlann Boxer  PPM/ICD - denies   Chest x-ray - n/a EKG - n/a Stress Test - denies ECHO - denies Cardiac Cath - denies  Sleep Study - denies   No diabetes  As of today, STOP taking any Aspirin (unless otherwise instructed by your surgeon) Aleve, Naproxen, Ibuprofen, Motrin, Advil, Goody's, BC's, all herbal medications, fish oil, and all vitamins.  ERAS Protcol -yes PRE-SURGERY Ensure or G2- no  COVID TEST- scheduled for 09/01/21 at 1215   Anesthesia review: no  Patient denies shortness of breath, fever, cough and chest pain at PAT appointment   All instructions explained to the patient, with a verbal understanding of the material. Patient agrees to go over the instructions while at home for a better understanding. Patient also instructed to self quarantine after being tested for COVID-19. The opportunity to ask questions was provided.

## 2021-08-26 NOTE — Progress Notes (Signed)
Surgical Instructions    Your procedure is scheduled on 09/03/21.  Report to [facility] Main Entrance "A" at 11:00 A.M., then check in with the Admitting office.  Call this number if you have problems the morning of surgery:  [phone]   If you have any questions prior to your surgery date call [phone]: Open Monday-Friday 8am-4pm    Remember:  Do not eat after midnight the night before your surgery  You may drink clear liquids until 10:00am the morning of your surgery.   Clear liquids allowed are: Water, Non-Citrus Juices (without pulp), Carbonated Beverages, Clear Tea, Black Coffee ONLY (NO MILK, CREAM OR POWDERED CREAMER of any kind), and Gatorade    Take these medicines the morning of surgery with A SIP OF WATER  cycloSPORINE (RESTASIS) gabapentin (NEURONTIN)  lamoTRIgine (LAMICTAL)  simvastatin (ZOCOR)   IF NEEDED: bisacodyl (DULCOLAX)   As of today, STOP taking any Aspirin (unless otherwise instructed by your surgeon) Aleve, Naproxen, Ibuprofen, Motrin, Advil, Goody's, BC's, all herbal medications, fish oil, and all vitamins.     After your COVID test   You are not required to quarantine however you are required to wear a well-fitting mask when you are out and around people not in your household.  If your mask becomes wet or soiled, replace with a new one.  Wash your hands often with soap and water for 20 seconds or clean your hands with an alcohol-based hand sanitizer that contains at least 60% alcohol.  Do not share personal items.  Notify your provider: if you are in close contact with someone who has COVID  or if you develop a fever of 100.4 or greater, sneezing, cough, sore throat, shortness of breath or body aches.             Do not wear jewelry or makeup Do not wear lotions, powders, perfumes/colognes, or deodorant. Do not shave 48 hours prior to surgery.  Men may shave face and neck. Do not bring valuables to the hospital.  DO Not wear nail  polish, gel polish, artificial nails, or any other type of covering on natural nails including finger and toenails. If patients have artificial nails, gel coating, etc. that need to be removed by a nail salon, please have this removed prior to surgery or surgery may need to be canceled/delayed if the surgeon/ anesthesia feels like the patient is unable to be adequately monitored.             [facility] is not responsible for any belongings or valuables.  Do NOT Smoke (Tobacco/Vaping)  24 hours prior to your procedure  If you use a CPAP at night, you may bring your mask for your overnight stay.   Contacts, glasses, hearing aids, dentures or partials may not be worn into surgery, please bring cases for these belongings   For patients admitted to the hospital, discharge time will be determined by your treatment team.   Patients discharged the day of surgery will not be allowed to drive home, and someone needs to stay with them for 24 hours.  NO VISITORS WILL BE ALLOWED IN PRE-OP WHERE PATIENTS ARE PREPPED FOR SURGERY.  ONLY 1 SUPPORT PERSON MAY BE PRESENT IN THE WAITING ROOM WHILE YOU ARE IN SURGERY.  IF YOU ARE TO BE ADMITTED, ONCE YOU ARE IN YOUR ROOM YOU WILL BE ALLOWED TWO (2) VISITORS. 1 (ONE) VISITOR MAY STAY OVERNIGHT BUT MUST ARRIVE TO THE ROOM BY 8pm.  Minor children may have two parents  present. Special consideration for safety and communication needs will be reviewed on a case by case basis.  Special instructions:    Oral Hygiene is also important to reduce your risk of infection.  Remember - BRUSH YOUR TEETH THE MORNING OF SURGERY WITH YOUR REGULAR TOOTHPASTE   [place]- Preparing For Surgery  Before surgery, you can play an important role. Because skin is not sterile, your skin needs to be as free of germs as possible. You can reduce the number of germs on your skin by washing with CHG (chlorahexidine gluconate) Soap before surgery.  CHG is an antiseptic cleaner which kills  germs and bonds with the skin to continue killing germs even after washing.     Please do not use if you have an allergy to CHG or antibacterial soaps. If your skin becomes reddened/irritated stop using the CHG.  Do not shave (including legs and underarms) for at least 48 hours prior to first CHG shower. It is OK to shave your face.  Please follow these instructions carefully.     Shower the NIGHT BEFORE SURGERY and the MORNING OF SURGERY with CHG Soap.   If you chose to wash your hair, wash your hair first as usual with your normal shampoo. After you shampoo, rinse your hair and body thoroughly to remove the shampoo.  Then [employer] and genitals (private parts) with your normal soap and rinse thoroughly to remove soap.  After that Use CHG Soap as you would any other liquid soap. You can apply CHG directly to the skin and wash gently with a scrungie or a clean washcloth.   Apply the CHG Soap to your body ONLY FROM THE NECK DOWN.  Do not use on open wounds or open sores. Avoid contact with your eyes, ears, mouth and genitals (private parts). Wash Face and genitals (private parts)  with your normal soap.   Wash thoroughly, paying special attention to the area where your surgery will be performed.  Thoroughly rinse your body with warm water from the neck down.  DO NOT shower/wash with your normal soap after using and rinsing off the CHG Soap.  Pat yourself dry with a CLEAN TOWEL.  Wear CLEAN PAJAMAS to bed the night before surgery  Place CLEAN SHEETS on your bed the night before your surgery  DO NOT SLEEP WITH PETS.   Day of Surgery: Take a shower with CHG soap. Wear Clean/Comfortable clothing the morning of surgery Do not apply any deodorants/lotions.   Remember to brush your teeth WITH YOUR REGULAR TOOTHPASTE.   Please read over the following fact sheets that you were given.

## 2021-08-27 ENCOUNTER — Ambulatory Visit (INDEPENDENT_AMBULATORY_CARE_PROVIDER_SITE_OTHER): Payer: Federal, State, Local not specified - PPO | Admitting: Family Medicine

## 2021-08-27 ENCOUNTER — Encounter: Payer: Self-pay | Admitting: Family Medicine

## 2021-08-27 VITALS — BP 104/70 | HR 68 | Temp 97.7°F | Ht 65.0 in | Wt 114.4 lb

## 2021-08-27 DIAGNOSIS — K219 Gastro-esophageal reflux disease without esophagitis: Secondary | ICD-10-CM | POA: Diagnosis not present

## 2021-08-27 DIAGNOSIS — D0472 Carcinoma in situ of skin of left lower limb, including hip: Secondary | ICD-10-CM | POA: Insufficient documentation

## 2021-08-27 DIAGNOSIS — R718 Other abnormality of red blood cells: Secondary | ICD-10-CM | POA: Diagnosis not present

## 2021-08-27 DIAGNOSIS — R1032 Left lower quadrant pain: Secondary | ICD-10-CM

## 2021-08-27 DIAGNOSIS — R350 Frequency of micturition: Secondary | ICD-10-CM

## 2021-08-27 LAB — POCT URINALYSIS DIP (PROADVANTAGE DEVICE)
Bilirubin, UA: NEGATIVE
Blood, UA: NEGATIVE
Glucose, UA: NEGATIVE mg/dL
Ketones, POC UA: NEGATIVE mg/dL
Leukocytes, UA: NEGATIVE
Nitrite, UA: NEGATIVE
Protein Ur, POC: NEGATIVE mg/dL
Specific Gravity, Urine: 1.015
Urobilinogen, Ur: NEGATIVE
pH, UA: 7 (ref 5.0–8.0)

## 2021-08-27 NOTE — Progress Notes (Signed)
Chief Complaint  Patient presents with   Abdominal Pain    Lower abdominal pain that started 2-3 weeks ago. Urinary frequency as well. Having back surgery 09/03/21.    Pain at LLQ, getting worse over the last week. At first thought she pulled a muscle, but now is getting worse. Hurts to get up from sitting, hurts leaning over, sometimes with walking. She has had urinary frequency over the last year--no recent change.  She denies dysuria or urgency. Also some issues with incontinence in the last 6 months (concern that it's related to her back, surgery is planned).   She is s/p TAH-BSO. Colonoscopy in 2017--Melanosis, internal hemorrhoids and polyp  No mention of diverticulosis. No h/o kidney stones.  Denies changes to bowels, denies constipation. She has h/o 2 lower intestine obstructions.  Takes dulolax regularly.  Stools have been dark in the last few days.  Not taking any iron, but has been taking some Pepto Bismol for some reflux, indigestion  She is having back surgery 11/2.  She has questions about her pre-op CBC results   PMH, PSH, SH reviewed  Outpatient Encounter Medications as of 08/27/2021  Medication Sig Note   acetaminophen (TYLENOL) 500 MG tablet Take 1,000 mg by mouth every 6 (six) hours as needed. 08/27/2021: Last dose 5:30am today.   bisacodyl (DULCOLAX) 5 MG EC tablet Take 1 tablet (5 mg total) by mouth daily as needed for moderate constipation.    cycloSPORINE (RESTASIS) 0.05 % ophthalmic emulsion Place 1 drop into both eyes 2 (two) times daily.    gabapentin (NEURONTIN) 600 MG tablet Take 600-1,200 mg by mouth See admin instructions. Take 600 mg by mouth in the morning and 1200 mg at night    lamoTRIgine (LAMICTAL) 100 MG tablet Take 100 mg by mouth 2 (two) times daily.     simvastatin (ZOCOR) 20 MG tablet TAKE 1 TABLET(20 MG) BY MOUTH DAILY    traZODone (DESYREL) 50 MG tablet Take 50 mg by mouth at bedtime.    [DISCONTINUED] DULoxetine (CYMBALTA) 20 MG capsule Take  1 capsule (20 mg total) by mouth daily.    b complex vitamins tablet Take 1 tablet by mouth daily. (Patient not taking: Reported on 08/27/2021)    cholecalciferol (VITAMIN D) 1000 UNITS tablet Take 1,000 Units by mouth daily. (Patient not taking: Reported on 08/27/2021)    docusate sodium (COLACE) 100 MG capsule Take 1 capsule (100 mg total) by mouth 2 (two) times daily. (Patient not taking: No sig reported)    hydrocortisone (PROCTOZONE-HC) 2.5 % rectal cream Place 1 application rectally 2 (two) times daily. (Patient not taking: Reported on 08/27/2021)    ibuprofen (ADVIL,MOTRIN) 800 MG tablet Take 1 tablet (800 mg total) by mouth every 8 (eight) hours as needed. (Patient not taking: Reported on 08/27/2021)    methocarbamol (ROBAXIN) 500 MG tablet Take 1 tablet (500 mg total) by mouth every 6 (six) hours as needed for muscle spasms. (Patient not taking: No sig reported)    polyethylene glycol (MIRALAX) 17 g packet Take 17 g by mouth daily. (Patient not taking: No sig reported)    promethazine (PHENERGAN) 25 MG suppository Place 1 suppository (25 mg total) rectally every 6 (six) hours as needed for nausea. (Patient not taking: No sig reported)    vitamin C (ASCORBIC ACID) 500 MG tablet Take 500 mg by mouth daily. (Patient not taking: Reported on 08/27/2021)    zinc gluconate 50 MG tablet Take 50 mg by mouth daily. (Patient not taking: Reported  on 08/27/2021)    [DISCONTINUED] celecoxib (CELEBREX) 200 MG capsule Take 1 capsule (200 mg total) by mouth 2 (two) times daily. One to 2 tablets by mouth daily as needed for pain. (Patient not taking: No sig reported)    [DISCONTINUED] predniSONE (DELTASONE) 20 MG tablet Take 1 tablet (20 mg total) by mouth daily with breakfast. (Patient not taking: No sig reported)    No facility-administered encounter medications on file as of 08/27/2021.   Allergies  Allergen Reactions   Morphine And Related Nausea Only    ROS: no fever, chills, n/v/d.  No URI  symptoms, headaches. See HPI   PHYSICAL EXAM:  BP 104/70   Pulse 68   Temp 97.7 F (36.5 C) (Tympanic)   Ht 5\' 5"  (1.651 m)   Wt 114 lb 6.4 oz (51.9 kg)   BMI 19.04 kg/m   Well-appearing female, thin, in no distress HEENT: conjunctiva and sclera are clear, EOMI Neck: no lymphadenopathy Heart: regular rate and rhythm Lungs: clear  Back no spinal or CVA tenderness Abdomen: soft, slight tenderness LLQ.  No epigastric tenderness. A lot of gas moving around upon palpation in the LUQ Pain in LLQ with doing a sit-up. No hernia/bulge. Abs are very strong Extremities: no edema Psych: normal mood, affect, hygiene and grooming Pelvic: normal external genitalia, atrophic changes. No lesions.  Uterus surgically absent.  Adnexa nontender, no masses Rectal: normal sphincter tone, no masses.  Heme negative dark stool Skin: multiple areas of bruising/blistering (saw dermatologist recently and treated with LN2)   Urine dip: cloudy, SG 1.015, otherwise normal.  Lab Results  Component Value Date   WBC 8.5 08/26/2021   HGB 12.8 08/26/2021   HCT 38.8 08/26/2021   MCV 102.6 (H) 08/26/2021   PLT 201 08/26/2021     ASSESSMENT/PLAN:  Left lower quadrant abdominal pain - ddx reviewed in detail with pt. Suspect muscular strain. Heat, rest, tylenol prn (no NSAIDs due to upcoming surgery). f/u if fever, worsening pain, vomiting - Plan: POCT Urinalysis DIP (Proadvantage Device)  Urinary frequency - reassured that urine doesn't indicate infection.  This has been more chronic, not acute - Plan: POCT Urinalysis DIP (Proadvantage Device)  Gastroesophageal reflux disease without esophagitis - counseled re: OTC meds and diet  Elevated MCV - prevously borderline, now >100. Ddx reviewed, not related to her current complaints. Reviewed mildly low RBC, normal Hg, questions answered   I spent 30 minutes dedicated to the care of this patient, including pre-visit review of records, face to face time,  post-visit ordering of testing and documentation.

## 2021-08-27 NOTE — Patient Instructions (Addendum)
Try pepcid (famotidine) or prilosec OTC for your indigestion (rather than pepto bismol).   I suspect your pain is muscular.  Use moist heat and tylenol as needed (don't take anti-inflammatories due to upcoming surgery).  Seek re-evaluation if you develop fever, worsening pain, nausea, vomiting, blood in the stools or other new concerns.

## 2021-09-01 ENCOUNTER — Other Ambulatory Visit (HOSPITAL_COMMUNITY)
Admission: RE | Admit: 2021-09-01 | Discharge: 2021-09-01 | Disposition: A | Discharge status: Home / Self Care | Payer: Federal, State, Local not specified - PPO | Source: Ambulatory Visit | Attending: Neurological Surgery | Admitting: Neurological Surgery

## 2021-09-01 ENCOUNTER — Other Ambulatory Visit (HOSPITAL_COMMUNITY): Payer: Federal, State, Local not specified - PPO

## 2021-09-01 DIAGNOSIS — Z01812 Encounter for preprocedural laboratory examination: Secondary | ICD-10-CM | POA: Insufficient documentation

## 2021-09-01 DIAGNOSIS — F1021 Alcohol dependence, in remission: Secondary | ICD-10-CM | POA: Diagnosis not present

## 2021-09-01 DIAGNOSIS — E785 Hyperlipidemia, unspecified: Secondary | ICD-10-CM | POA: Diagnosis not present

## 2021-09-01 DIAGNOSIS — G8929 Other chronic pain: Secondary | ICD-10-CM | POA: Diagnosis not present

## 2021-09-01 DIAGNOSIS — Z8249 Family history of ischemic heart disease and other diseases of the circulatory system: Secondary | ICD-10-CM | POA: Diagnosis not present

## 2021-09-01 DIAGNOSIS — Z20822 Contact with and (suspected) exposure to covid-19: Secondary | ICD-10-CM | POA: Insufficient documentation

## 2021-09-01 DIAGNOSIS — Z87891 Personal history of nicotine dependence: Secondary | ICD-10-CM | POA: Diagnosis not present

## 2021-09-01 DIAGNOSIS — Z803 Family history of malignant neoplasm of breast: Secondary | ICD-10-CM | POA: Diagnosis not present

## 2021-09-01 DIAGNOSIS — H40013 Open angle with borderline findings, low risk, bilateral: Secondary | ICD-10-CM | POA: Diagnosis not present

## 2021-09-01 DIAGNOSIS — M1812 Unilateral primary osteoarthritis of first carpometacarpal joint, left hand: Secondary | ICD-10-CM | POA: Diagnosis not present

## 2021-09-01 DIAGNOSIS — Z96651 Presence of right artificial knee joint: Secondary | ICD-10-CM | POA: Diagnosis not present

## 2021-09-01 DIAGNOSIS — M4316 Spondylolisthesis, lumbar region: Secondary | ICD-10-CM | POA: Diagnosis not present

## 2021-09-01 DIAGNOSIS — M4726 Other spondylosis with radiculopathy, lumbar region: Secondary | ICD-10-CM | POA: Diagnosis not present

## 2021-09-01 DIAGNOSIS — Z87410 Personal history of cervical dysplasia: Secondary | ICD-10-CM | POA: Diagnosis not present

## 2021-09-01 DIAGNOSIS — F411 Generalized anxiety disorder: Secondary | ICD-10-CM | POA: Diagnosis not present

## 2021-09-01 DIAGNOSIS — Z807 Family history of other malignant neoplasms of lymphoid, hematopoietic and related tissues: Secondary | ICD-10-CM | POA: Diagnosis not present

## 2021-09-01 DIAGNOSIS — M48061 Spinal stenosis, lumbar region without neurogenic claudication: Secondary | ICD-10-CM | POA: Diagnosis not present

## 2021-09-01 DIAGNOSIS — Z01818 Encounter for other preprocedural examination: Secondary | ICD-10-CM

## 2021-09-01 LAB — SARS CORONAVIRUS 2 (TAT 6-24 HRS): SARS Coronavirus 2: NEGATIVE

## 2021-09-03 ENCOUNTER — Inpatient Hospital Stay (HOSPITAL_COMMUNITY): Payer: Federal, State, Local not specified - PPO

## 2021-09-03 ENCOUNTER — Encounter (HOSPITAL_COMMUNITY): Payer: Self-pay | Admitting: Neurological Surgery

## 2021-09-03 ENCOUNTER — Inpatient Hospital Stay (HOSPITAL_COMMUNITY): Payer: Federal, State, Local not specified - PPO | Admitting: Certified Registered Nurse Anesthetist

## 2021-09-03 ENCOUNTER — Encounter (HOSPITAL_COMMUNITY)
Admission: RE | Disposition: A | Discharge status: Home / Self Care | Payer: Self-pay | Source: Home / Self Care | Attending: Neurological Surgery

## 2021-09-03 ENCOUNTER — Inpatient Hospital Stay (HOSPITAL_COMMUNITY)
Admission: RE | Admit: 2021-09-03 | Discharge: 2021-09-05 | DRG: 455 | Disposition: A | Discharge status: Home / Self Care | Payer: Federal, State, Local not specified - PPO | Attending: Neurological Surgery | Admitting: Neurological Surgery

## 2021-09-03 ENCOUNTER — Other Ambulatory Visit: Payer: Self-pay

## 2021-09-03 DIAGNOSIS — M5116 Intervertebral disc disorders with radiculopathy, lumbar region: Secondary | ICD-10-CM | POA: Diagnosis present

## 2021-09-03 DIAGNOSIS — Z8249 Family history of ischemic heart disease and other diseases of the circulatory system: Secondary | ICD-10-CM

## 2021-09-03 DIAGNOSIS — G8929 Other chronic pain: Secondary | ICD-10-CM | POA: Diagnosis present

## 2021-09-03 DIAGNOSIS — Z87891 Personal history of nicotine dependence: Secondary | ICD-10-CM | POA: Diagnosis not present

## 2021-09-03 DIAGNOSIS — F1021 Alcohol dependence, in remission: Secondary | ICD-10-CM | POA: Diagnosis present

## 2021-09-03 DIAGNOSIS — Z96651 Presence of right artificial knee joint: Secondary | ICD-10-CM | POA: Diagnosis present

## 2021-09-03 DIAGNOSIS — M4326 Fusion of spine, lumbar region: Secondary | ICD-10-CM | POA: Diagnosis not present

## 2021-09-03 DIAGNOSIS — M48061 Spinal stenosis, lumbar region without neurogenic claudication: Secondary | ICD-10-CM | POA: Diagnosis not present

## 2021-09-03 DIAGNOSIS — Z20822 Contact with and (suspected) exposure to covid-19: Secondary | ICD-10-CM | POA: Diagnosis present

## 2021-09-03 DIAGNOSIS — Z807 Family history of other malignant neoplasms of lymphoid, hematopoietic and related tissues: Secondary | ICD-10-CM

## 2021-09-03 DIAGNOSIS — E785 Hyperlipidemia, unspecified: Secondary | ICD-10-CM | POA: Diagnosis present

## 2021-09-03 DIAGNOSIS — Z87410 Personal history of cervical dysplasia: Secondary | ICD-10-CM

## 2021-09-03 DIAGNOSIS — F909 Attention-deficit hyperactivity disorder, unspecified type: Secondary | ICD-10-CM | POA: Diagnosis present

## 2021-09-03 DIAGNOSIS — M4726 Other spondylosis with radiculopathy, lumbar region: Secondary | ICD-10-CM | POA: Diagnosis not present

## 2021-09-03 DIAGNOSIS — M1812 Unilateral primary osteoarthritis of first carpometacarpal joint, left hand: Secondary | ICD-10-CM | POA: Diagnosis present

## 2021-09-03 DIAGNOSIS — F411 Generalized anxiety disorder: Secondary | ICD-10-CM | POA: Diagnosis present

## 2021-09-03 DIAGNOSIS — K589 Irritable bowel syndrome without diarrhea: Secondary | ICD-10-CM | POA: Diagnosis not present

## 2021-09-03 DIAGNOSIS — M5416 Radiculopathy, lumbar region: Secondary | ICD-10-CM | POA: Diagnosis not present

## 2021-09-03 DIAGNOSIS — M4316 Spondylolisthesis, lumbar region: Secondary | ICD-10-CM | POA: Diagnosis not present

## 2021-09-03 DIAGNOSIS — Z981 Arthrodesis status: Secondary | ICD-10-CM | POA: Diagnosis not present

## 2021-09-03 DIAGNOSIS — Z803 Family history of malignant neoplasm of breast: Secondary | ICD-10-CM | POA: Diagnosis not present

## 2021-09-03 DIAGNOSIS — Z885 Allergy status to narcotic agent status: Secondary | ICD-10-CM

## 2021-09-03 DIAGNOSIS — Z79899 Other long term (current) drug therapy: Secondary | ICD-10-CM | POA: Diagnosis not present

## 2021-09-03 DIAGNOSIS — Z419 Encounter for procedure for purposes other than remedying health state, unspecified: Secondary | ICD-10-CM

## 2021-09-03 HISTORY — PX: TRANSFORAMINAL LUMBAR INTERBODY FUSION W/ MIS 1 LEVEL: SHX6145

## 2021-09-03 SURGERY — MINIMALLY INVASIVE (MIS) TRANSFORAMINAL LUMBAR INTERBODY FUSION (TLIF) 1 LEVEL
Anesthesia: General

## 2021-09-03 MED ORDER — ACETAMINOPHEN 10 MG/ML IV SOLN
INTRAVENOUS | Status: AC
  Filled 2021-09-03: qty 100

## 2021-09-03 MED ORDER — CHLORHEXIDINE GLUCONATE CLOTH 2 % EX PADS: 6.0000 | MEDICATED_PAD | Freq: Once | CUTANEOUS | Status: DC

## 2021-09-03 MED ORDER — LIDOCAINE 2% (20 MG/ML) 5 ML SYRINGE
INTRAMUSCULAR | Status: DC | PRN
  Administered 2021-09-03: 50 mg via INTRAVENOUS

## 2021-09-03 MED ORDER — BUPIVACAINE-EPINEPHRINE (PF) 0.5% -1:200000 IJ SOLN
INTRAMUSCULAR | Status: DC | PRN
  Administered 2021-09-03: 10 mL via PERINEURAL

## 2021-09-03 MED ORDER — AMISULPRIDE (ANTIEMETIC) 5 MG/2ML IV SOLN: 10.0000 mg | Freq: Once | INTRAVENOUS | Status: DC | PRN

## 2021-09-03 MED ORDER — METHOCARBAMOL 500 MG PO TABS
500.0000 mg | ORAL_TABLET | Freq: Four times a day (QID) | ORAL | Status: DC | PRN
  Administered 2021-09-03 – 2021-09-05 (×5): 500 mg via ORAL
  Filled 2021-09-03 (×5): qty 1

## 2021-09-03 MED ORDER — PROPOFOL 10 MG/ML IV BOLUS
INTRAVENOUS | Status: AC
  Filled 2021-09-03: qty 20

## 2021-09-03 MED ORDER — FENTANYL CITRATE (PF) 250 MCG/5ML IJ SOLN
INTRAMUSCULAR | Status: AC
  Filled 2021-09-03: qty 5

## 2021-09-03 MED ORDER — LIDOCAINE-EPINEPHRINE 1 %-1:100000 IJ SOLN
INTRAMUSCULAR | Status: AC
  Filled 2021-09-03: qty 1

## 2021-09-03 MED ORDER — MENTHOL 3 MG MT LOZG: 1.0000 | LOZENGE | OROMUCOSAL | Status: DC | PRN

## 2021-09-03 MED ORDER — BUPIVACAINE LIPOSOME 1.3 % IJ SUSP
INTRAMUSCULAR | Status: AC
  Filled 2021-09-03: qty 20

## 2021-09-03 MED ORDER — ROCURONIUM BROMIDE 10 MG/ML (PF) SYRINGE
PREFILLED_SYRINGE | INTRAVENOUS | Status: AC
  Filled 2021-09-03: qty 20

## 2021-09-03 MED ORDER — DEXAMETHASONE SODIUM PHOSPHATE 10 MG/ML IJ SOLN
INTRAMUSCULAR | Status: DC | PRN
  Administered 2021-09-03: 10 mg via INTRAVENOUS

## 2021-09-03 MED ORDER — PHENYLEPHRINE 40 MCG/ML (10ML) SYRINGE FOR IV PUSH (FOR BLOOD PRESSURE SUPPORT)
PREFILLED_SYRINGE | INTRAVENOUS | Status: DC | PRN
  Administered 2021-09-03 (×3): 80 ug via INTRAVENOUS
  Administered 2021-09-03: 40 ug via INTRAVENOUS
  Administered 2021-09-03: 120 ug via INTRAVENOUS

## 2021-09-03 MED ORDER — OXYCODONE HCL 5 MG PO TABS
10.0000 mg | ORAL_TABLET | ORAL | Status: DC | PRN
  Administered 2021-09-03 – 2021-09-05 (×9): 10 mg via ORAL
  Filled 2021-09-03 (×9): qty 2

## 2021-09-03 MED ORDER — LIDOCAINE-EPINEPHRINE 1 %-1:100000 IJ SOLN
INTRAMUSCULAR | Status: DC | PRN
  Administered 2021-09-03: 10 mL

## 2021-09-03 MED ORDER — DEXMEDETOMIDINE (PRECEDEX) IN NS 20 MCG/5ML (4 MCG/ML) IV SYRINGE
PREFILLED_SYRINGE | INTRAVENOUS | Status: AC
  Filled 2021-09-03: qty 5

## 2021-09-03 MED ORDER — GABAPENTIN 600 MG PO TABS
600.0000 mg | ORAL_TABLET | Freq: Every morning | ORAL | Status: DC
  Administered 2021-09-04 – 2021-09-05 (×2): 600 mg via ORAL
  Filled 2021-09-03 (×2): qty 1

## 2021-09-03 MED ORDER — MIDAZOLAM HCL 2 MG/2ML IJ SOLN
INTRAMUSCULAR | Status: AC
  Filled 2021-09-03: qty 2

## 2021-09-03 MED ORDER — ONDANSETRON HCL 4 MG/2ML IJ SOLN: 4.0000 mg | Freq: Four times a day (QID) | INTRAMUSCULAR | Status: DC | PRN

## 2021-09-03 MED ORDER — HYDROCODONE-ACETAMINOPHEN 10-325 MG PO TABS
1.0000 | ORAL_TABLET | ORAL | Status: DC | PRN
  Administered 2021-09-04 – 2021-09-05 (×2): 1 via ORAL
  Filled 2021-09-03 (×2): qty 1

## 2021-09-03 MED ORDER — DEXMEDETOMIDINE (PRECEDEX) IN NS 20 MCG/5ML (4 MCG/ML) IV SYRINGE
PREFILLED_SYRINGE | INTRAVENOUS | Status: DC | PRN
  Administered 2021-09-03: 8 ug via INTRAVENOUS

## 2021-09-03 MED ORDER — LACTATED RINGERS IV SOLN: INTRAVENOUS | Status: DC

## 2021-09-03 MED ORDER — GABAPENTIN 600 MG PO TABS
1200.0000 mg | ORAL_TABLET | Freq: Every day | ORAL | Status: DC
  Administered 2021-09-03 – 2021-09-04 (×2): 1200 mg via ORAL
  Filled 2021-09-03 (×2): qty 2

## 2021-09-03 MED ORDER — ACETAMINOPHEN 325 MG PO TABS: 650.0000 mg | ORAL_TABLET | ORAL | Status: DC | PRN

## 2021-09-03 MED ORDER — CHLORHEXIDINE GLUCONATE 0.12 % MT SOLN
15.0000 mL | Freq: Once | OROMUCOSAL | Status: AC
  Administered 2021-09-03: 15 mL via OROMUCOSAL
  Filled 2021-09-03: qty 15

## 2021-09-03 MED ORDER — PHENYLEPHRINE HCL (PRESSORS) 10 MG/ML IV SOLN
INTRAVENOUS | Status: AC
  Filled 2021-09-03: qty 2

## 2021-09-03 MED ORDER — PROPOFOL 10 MG/ML IV BOLUS
INTRAVENOUS | Status: DC | PRN
  Administered 2021-09-03: 130 mg via INTRAVENOUS

## 2021-09-03 MED ORDER — CEFAZOLIN SODIUM-DEXTROSE 2-4 GM/100ML-% IV SOLN
2.0000 g | Freq: Three times a day (TID) | INTRAVENOUS | Status: AC
  Administered 2021-09-03 – 2021-09-04 (×2): 2 g via INTRAVENOUS
  Filled 2021-09-03 (×2): qty 100

## 2021-09-03 MED ORDER — 0.9 % SODIUM CHLORIDE (POUR BTL) OPTIME
TOPICAL | Status: DC | PRN
  Administered 2021-09-03: 1000 mL

## 2021-09-03 MED ORDER — DEXAMETHASONE SODIUM PHOSPHATE 10 MG/ML IJ SOLN
INTRAMUSCULAR | Status: AC
  Filled 2021-09-03: qty 1

## 2021-09-03 MED ORDER — THROMBIN 20000 UNITS EX SOLR: CUTANEOUS | Status: DC | PRN

## 2021-09-03 MED ORDER — PHENOL 1.4 % MT LIQD: 1.0000 | OROMUCOSAL | Status: DC | PRN

## 2021-09-03 MED ORDER — PHENYLEPHRINE HCL-NACL 20-0.9 MG/250ML-% IV SOLN
INTRAVENOUS | Status: DC | PRN
  Administered 2021-09-03: 40 ug/min via INTRAVENOUS

## 2021-09-03 MED ORDER — THROMBIN 5000 UNITS EX SOLR
OROMUCOSAL | Status: DC | PRN
  Administered 2021-09-03: 5 mL via TOPICAL

## 2021-09-03 MED ORDER — CEFAZOLIN SODIUM-DEXTROSE 2-4 GM/100ML-% IV SOLN
2.0000 g | INTRAVENOUS | Status: AC
  Administered 2021-09-03: 2 g via INTRAVENOUS
  Filled 2021-09-03: qty 100

## 2021-09-03 MED ORDER — TRAZODONE HCL 50 MG PO TABS
50.0000 mg | ORAL_TABLET | Freq: Every day | ORAL | Status: DC
  Administered 2021-09-03 – 2021-09-04 (×2): 50 mg via ORAL
  Filled 2021-09-03 (×2): qty 1

## 2021-09-03 MED ORDER — ALUM & MAG HYDROXIDE-SIMETH 200-200-20 MG/5ML PO SUSP: 30.0000 mL | Freq: Four times a day (QID) | ORAL | Status: DC | PRN

## 2021-09-03 MED ORDER — GABAPENTIN 600 MG PO TABS: 600.0000 mg | ORAL_TABLET | ORAL | Status: DC

## 2021-09-03 MED ORDER — HEMOSTATIC AGENTS (NO CHARGE) OPTIME
TOPICAL | Status: DC | PRN
  Administered 2021-09-03: 1 via TOPICAL

## 2021-09-03 MED ORDER — FENTANYL CITRATE (PF) 100 MCG/2ML IJ SOLN
25.0000 ug | INTRAMUSCULAR | Status: DC | PRN
  Administered 2021-09-03: 50 ug via INTRAVENOUS

## 2021-09-03 MED ORDER — LIDOCAINE 2% (20 MG/ML) 5 ML SYRINGE
INTRAMUSCULAR | Status: AC
  Filled 2021-09-03: qty 10

## 2021-09-03 MED ORDER — MIDAZOLAM HCL 2 MG/2ML IJ SOLN
INTRAMUSCULAR | Status: DC | PRN
  Administered 2021-09-03: 2 mg via INTRAVENOUS

## 2021-09-03 MED ORDER — HYDROMORPHONE HCL 1 MG/ML IJ SOLN
0.5000 mg | INTRAMUSCULAR | Status: DC | PRN
  Administered 2021-09-03 – 2021-09-04 (×2): 0.5 mg via INTRAVENOUS
  Filled 2021-09-03 (×2): qty 0.5

## 2021-09-03 MED ORDER — KETOROLAC TROMETHAMINE 15 MG/ML IJ SOLN
7.5000 mg | Freq: Four times a day (QID) | INTRAMUSCULAR | Status: AC
  Administered 2021-09-03 – 2021-09-04 (×4): 7.5 mg via INTRAVENOUS
  Filled 2021-09-03 (×4): qty 1

## 2021-09-03 MED ORDER — ONDANSETRON HCL 4 MG/2ML IJ SOLN
INTRAMUSCULAR | Status: DC | PRN
  Administered 2021-09-03: 4 mg via INTRAVENOUS

## 2021-09-03 MED ORDER — ORAL CARE MOUTH RINSE: 15.0000 mL | Freq: Once | OROMUCOSAL | Status: AC

## 2021-09-03 MED ORDER — ONDANSETRON HCL 4 MG PO TABS: 4.0000 mg | ORAL_TABLET | Freq: Four times a day (QID) | ORAL | Status: DC | PRN

## 2021-09-03 MED ORDER — BISACODYL 5 MG PO TBEC
5.0000 mg | DELAYED_RELEASE_TABLET | Freq: Every day | ORAL | Status: DC | PRN
  Administered 2021-09-04 (×2): 5 mg via ORAL
  Filled 2021-09-03 (×2): qty 1

## 2021-09-03 MED ORDER — EPHEDRINE SULFATE-NACL 50-0.9 MG/10ML-% IV SOSY
PREFILLED_SYRINGE | INTRAVENOUS | Status: DC | PRN
  Administered 2021-09-03: 15 mg via INTRAVENOUS

## 2021-09-03 MED ORDER — EPHEDRINE 5 MG/ML INJ
INTRAVENOUS | Status: AC
  Filled 2021-09-03: qty 5

## 2021-09-03 MED ORDER — CYCLOSPORINE 0.05 % OP EMUL
1.0000 [drp] | Freq: Two times a day (BID) | OPHTHALMIC | Status: DC
  Administered 2021-09-03 – 2021-09-05 (×4): 1 [drp] via OPHTHALMIC
  Filled 2021-09-03 (×4): qty 30

## 2021-09-03 MED ORDER — BUPIVACAINE LIPOSOME 1.3 % IJ SUSP
INTRAMUSCULAR | Status: DC | PRN
  Administered 2021-09-03: 20 mL

## 2021-09-03 MED ORDER — SODIUM CHLORIDE 0.9% FLUSH: 3.0000 mL | INTRAVENOUS | Status: DC | PRN

## 2021-09-03 MED ORDER — SUGAMMADEX SODIUM 200 MG/2ML IV SOLN
INTRAVENOUS | Status: DC | PRN
  Administered 2021-09-03: 200 mg via INTRAVENOUS

## 2021-09-03 MED ORDER — THROMBIN 20000 UNITS EX SOLR
CUTANEOUS | Status: AC
  Filled 2021-09-03: qty 20000

## 2021-09-03 MED ORDER — SIMVASTATIN 20 MG PO TABS
10.0000 mg | ORAL_TABLET | Freq: Every day | ORAL | Status: DC
  Administered 2021-09-04: 10 mg via ORAL
  Filled 2021-09-03: qty 1

## 2021-09-03 MED ORDER — LAMOTRIGINE 100 MG PO TABS
100.0000 mg | ORAL_TABLET | Freq: Two times a day (BID) | ORAL | Status: DC
  Administered 2021-09-03 – 2021-09-05 (×4): 100 mg via ORAL
  Filled 2021-09-03 (×4): qty 1

## 2021-09-03 MED ORDER — ONDANSETRON HCL 4 MG/2ML IJ SOLN
INTRAMUSCULAR | Status: AC
  Filled 2021-09-03: qty 2

## 2021-09-03 MED ORDER — ACETAMINOPHEN 650 MG RE SUPP: 650.0000 mg | RECTAL | Status: DC | PRN

## 2021-09-03 MED ORDER — BUPIVACAINE-EPINEPHRINE 0.5% -1:200000 IJ SOLN
INTRAMUSCULAR | Status: AC
  Filled 2021-09-03: qty 1

## 2021-09-03 MED ORDER — SODIUM CHLORIDE 0.9% FLUSH
3.0000 mL | Freq: Two times a day (BID) | INTRAVENOUS | Status: DC
  Administered 2021-09-04: 3 mL via INTRAVENOUS

## 2021-09-03 MED ORDER — METHOCARBAMOL 1000 MG/10ML IJ SOLN
500.0000 mg | Freq: Four times a day (QID) | INTRAVENOUS | Status: DC | PRN
  Filled 2021-09-03: qty 5

## 2021-09-03 MED ORDER — FENTANYL CITRATE (PF) 100 MCG/2ML IJ SOLN
INTRAMUSCULAR | Status: AC
  Filled 2021-09-03: qty 2

## 2021-09-03 MED ORDER — THROMBIN 5000 UNITS EX SOLR
CUTANEOUS | Status: AC
  Filled 2021-09-03: qty 5000

## 2021-09-03 MED ORDER — FENTANYL CITRATE (PF) 250 MCG/5ML IJ SOLN
INTRAMUSCULAR | Status: DC | PRN
  Administered 2021-09-03 (×4): 50 ug via INTRAVENOUS
  Administered 2021-09-03: 100 ug via INTRAVENOUS

## 2021-09-03 MED ORDER — ACETAMINOPHEN 10 MG/ML IV SOLN
1000.0000 mg | Freq: Once | INTRAVENOUS | Status: DC | PRN
  Administered 2021-09-03: 1000 mg via INTRAVENOUS

## 2021-09-03 MED ORDER — SODIUM CHLORIDE 0.9 % IV SOLN: 250.0000 mL | INTRAVENOUS | Status: DC

## 2021-09-03 MED ORDER — PHENYLEPHRINE 40 MCG/ML (10ML) SYRINGE FOR IV PUSH (FOR BLOOD PRESSURE SUPPORT)
PREFILLED_SYRINGE | INTRAVENOUS | Status: AC
  Filled 2021-09-03: qty 10

## 2021-09-03 MED ORDER — ROCURONIUM BROMIDE 10 MG/ML (PF) SYRINGE
PREFILLED_SYRINGE | INTRAVENOUS | Status: DC | PRN
  Administered 2021-09-03 (×2): 10 mg via INTRAVENOUS
  Administered 2021-09-03: 50 mg via INTRAVENOUS

## 2021-09-03 SURGICAL SUPPLY — 76 items
ADH SKN CLS APL DERMABOND .7 (GAUZE/BANDAGES/DRESSINGS) ×1
BAG BANDED W/RUBBER/TAPE 36X54 (MISCELLANEOUS) ×4 IMPLANT
BAG COUNTER SPONGE SURGICOUNT (BAG) ×3 IMPLANT
BAG EQP BAND 135X91 W/RBR TAPE (MISCELLANEOUS) ×2
BAG SPNG CNTER NS LX DISP (BAG) ×2
BAND INSRT 18 STRL LF DISP RB (MISCELLANEOUS) ×2
BAND RUBBER #18 3X1/16 STRL (MISCELLANEOUS) ×4 IMPLANT
BASKET BONE COLLECTION (BASKET) ×1 IMPLANT
BUR 2.5 MTCH HD 16 (BUR) ×2 IMPLANT
BUR NEURO DRILL SOFT 3.0X3.8M (BURR) ×1 IMPLANT
CAGE TLIF MOD 10X25X7 8D (Cage) ×1 IMPLANT
CARTRIDGE OIL MAESTRO DRILL (MISCELLANEOUS) ×1 IMPLANT
CNTNR URN SCR LID CUP LEK RST (MISCELLANEOUS) ×1 IMPLANT
CONT SPEC 4OZ STRL OR WHT (MISCELLANEOUS) ×2
COVER BACK TABLE 60X90IN (DRAPES) ×2 IMPLANT
COVER MAYO STAND STRL (DRAPES) ×2 IMPLANT
DECANTER SPIKE VIAL GLASS SM (MISCELLANEOUS) ×1 IMPLANT
DERMABOND ADVANCED (GAUZE/BANDAGES/DRESSINGS) ×1
DERMABOND ADVANCED .7 DNX12 (GAUZE/BANDAGES/DRESSINGS) IMPLANT
DIFFUSER DRILL AIR PNEUMATIC (MISCELLANEOUS) ×1 IMPLANT
DRAIN JACKSON RD 7FR 3/32 (WOUND CARE) IMPLANT
DRAPE C-ARMOR (DRAPES) IMPLANT
DRAPE LAPAROTOMY 100X72X124 (DRAPES) ×2 IMPLANT
DRAPE MICROSCOPE LEICA (MISCELLANEOUS) ×2 IMPLANT
DRAPE STERI IOBAN 125X83 (DRAPES) ×1 IMPLANT
DRSG OPSITE POSTOP 3X4 (GAUZE/BANDAGES/DRESSINGS) ×2 IMPLANT
DURAPREP 26ML APPLICATOR (WOUND CARE) ×2 IMPLANT
ELECT BLADE INSULATED 6.5IN (ELECTROSURGICAL) ×2
ELECT REM PT RETURN 9FT ADLT (ELECTROSURGICAL) ×2
ELECTRODE BLDE INSULATED 6.5IN (ELECTROSURGICAL) ×1 IMPLANT
ELECTRODE REM PT RTRN 9FT ADLT (ELECTROSURGICAL) ×1 IMPLANT
GAUZE 4X4 16PLY ~~LOC~~+RFID DBL (SPONGE) IMPLANT
GAUZE SPONGE 4X4 12PLY STRL (GAUZE/BANDAGES/DRESSINGS) ×2 IMPLANT
GLOVE SRG 8 PF TXTR STRL LF DI (GLOVE) ×2 IMPLANT
GLOVE SURG LTX SZ8 (GLOVE) ×4 IMPLANT
GLOVE SURG UNDER POLY LF SZ8 (GLOVE) ×4
GOWN STRL REUS W/ TWL LRG LVL3 (GOWN DISPOSABLE) IMPLANT
GOWN STRL REUS W/ TWL XL LVL3 (GOWN DISPOSABLE) ×1 IMPLANT
GOWN STRL REUS W/TWL 2XL LVL3 (GOWN DISPOSABLE) IMPLANT
GOWN STRL REUS W/TWL LRG LVL3 (GOWN DISPOSABLE)
GOWN STRL REUS W/TWL XL LVL3 (GOWN DISPOSABLE) ×4
GUIDEWIRE NITINOL BEVEL TIP (WIRE) ×4 IMPLANT
HEMOSTAT POWDER SURGIFOAM 1G (HEMOSTASIS) ×1 IMPLANT
KIT BASIN OR (CUSTOM PROCEDURE TRAY) ×2 IMPLANT
KIT INFUSE XX SMALL 0.7CC (Orthopedic Implant) ×1 IMPLANT
KIT POSITION SURG JACKSON T1 (MISCELLANEOUS) ×2 IMPLANT
KIT TURNOVER KIT B (KITS) ×2 IMPLANT
MARKER SKIN DUAL TIP RULER LAB (MISCELLANEOUS) ×2 IMPLANT
NDL BIOPSY DD SERENGETI 8G (NEEDLE) IMPLANT
NDL HYPO 25X1 1.5 SAFETY (NEEDLE) ×1 IMPLANT
NDL SPNL 22GX3.5 QUINCKE BK (NEEDLE) IMPLANT
NEEDLE BIOPSY DD SERENGETI 8G (NEEDLE) ×2 IMPLANT
NEEDLE HYPO 25X1 1.5 SAFETY (NEEDLE) ×2 IMPLANT
NEEDLE SPNL 22GX3.5 QUINCKE BK (NEEDLE) ×4 IMPLANT
NS IRRIG 1000ML POUR BTL (IV SOLUTION) ×2 IMPLANT
OIL CARTRIDGE MAESTRO DRILL (MISCELLANEOUS)
PACK LAMINECTOMY NEURO (CUSTOM PROCEDURE TRAY) ×2 IMPLANT
PAD ARMBOARD 7.5X6 YLW CONV (MISCELLANEOUS) ×5 IMPLANT
PATTIES SURGICAL .5 X.5 (GAUZE/BANDAGES/DRESSINGS) IMPLANT
PATTIES SURGICAL .5 X1 (DISPOSABLE) IMPLANT
PATTIES SURGICAL 1X1 (DISPOSABLE) IMPLANT
ROD LORD RELINE 5.5X30 (Rod) ×2 IMPLANT
SCREW LOCK RELINE 5.5 TULIP (Screw) ×4 IMPLANT
SCREW MAS RED RELINE 6.5X55 (Screw) ×2 IMPLANT
SCREW RED RELINE 7.5X45MM POLY (Screw) ×2 IMPLANT
SEALANT ADHERUS EXTEND TIP (MISCELLANEOUS) ×1 IMPLANT
SPONGE SURGIFOAM ABS GEL 100 (HEMOSTASIS) ×1 IMPLANT
SPONGE T-LAP 4X18 ~~LOC~~+RFID (SPONGE) ×2 IMPLANT
STAPLER VISISTAT 35W (STAPLE) ×1 IMPLANT
SUT VIC AB 0 CT1 27 (SUTURE) ×2
SUT VIC AB 0 CT1 27XBRD ANBCTR (SUTURE) ×1 IMPLANT
SUT VIC AB 2-0 CP2 18 (SUTURE) ×2 IMPLANT
SUT VIC AB 3-0 SH 8-18 (SUTURE) IMPLANT
TOWEL GREEN STERILE (TOWEL DISPOSABLE) IMPLANT
TOWEL GREEN STERILE FF (TOWEL DISPOSABLE) ×1 IMPLANT
WATER STERILE IRR 1000ML POUR (IV SOLUTION) ×2 IMPLANT

## 2021-09-03 NOTE — Progress Notes (Signed)
   Providing Compassionate, Quality Care - Together  NEUROSURGERY PROGRESS NOTE   S: Patient seen and examined in recovery, complaining of appropriate posterior lumbar pain  O: EXAM:  BP 123/67 (BP Location: Right Arm)   Pulse 78   Temp 98.1 F (36.7 C)   Resp 15   Ht 5\' 5"  (1.651 m)   Wt 51.3 kg   SpO2 97%   BMI 18.80 kg/m   Awake, alert, oriented x3 PERRL Speech fluent, appropriate  CNs grossly intact  5/5 BUE/BLE  Sensory intact light touch throughout  ASSESSMENT:  65 y.o. female with   L4-5 degenerative spondylolisthesis with severe stenosis  -Status post L4-5 decompression and TLIF on 09/03/2021  PLAN: -PT/OT -Pain control -I updated the family via phone    Thank you for allowing me to participate in this patient's care.  Please do not hesitate to call with questions or concerns.   Elwin Sleight, [facility] Cell: [phone]

## 2021-09-03 NOTE — Transfer of Care (Signed)
Immediate Anesthesia Transfer of Care Note  Patient: Bethany [name]  Procedure(s) Performed: Lumbar four-five Minimally invasive transforaminal lumbar interbody fusion  Patient Location: PACU  Anesthesia Type:General  Level of Consciousness: drowsy and patient cooperative  Airway & Oxygen Therapy: Patient Spontanous Breathing  Post-op Assessment: Report given to RN and Post -op Vital signs reviewed and stable  Post vital signs: Reviewed and stable  Last Vitals:  Vitals Value Taken Time  BP 141/77 09/03/21 1729  Temp    Pulse 83 09/03/21 1732  Resp 7 09/03/21 1732  SpO2 92 % 09/03/21 1732  Vitals shown include unvalidated device data.  Last Pain:  Vitals:   09/03/21 1128  TempSrc:   PainSc: 2          Complications: No notable events documented.

## 2021-09-03 NOTE — H&P (Signed)
Providing Compassionate, Quality Care - Together  NEUROSURGERY HISTORY & PHYSICAL   Bethany [name] is an 65 y.o. female.   Chief Complaint: Right lower extremity radiculopathy and chronic low back pain HPI: This is a 65 year old female with a history of chronic low back pain and right lower extremity radiculopathy for many years.  It became progressively worsening and imaging revealed a mobile spondylolisthesis at L4-5 with anterior listhesis of upwards of 11 mm.  Her low back pain was intractable to conservative measures as well as her leg pain.  Given the progression of her pain and resistance to conservative measures, I recommended surgical intervention in the form of an L4-5 TLIF.  She presents today with no changes in her symptomatology.  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 All Paps normal afterwards   Degenerative disc disease, lumbar 05/02/2020   Dyslipidemia    Generalized anxiety disorder    Herpes genitalis    Herpes labialis    History of calcium pyrophosphate deposition disease (CPPD) 03/26/2020   History of opiate abuse, in remission    History of small bowel obstruction 04/17/2018   partial   Hyperlipidemia with target LDL less than 100 06/17/2011   IBS (irritable bowel syndrome)    Lumbago with sciatica, right side 02/10/2021   Major depressive disorder    Meibomian gland dysfunction (MGD) of both eyes 08/02/2020   Nonallopathic lesion of lumbosacral region 08/05/2017   Nonallopathic lesion of sacral region 08/05/2017   Nonallopathic lesion of thoracic region 08/05/2017   Osteopenia 04/2014   T score -1.7 FRAX 7.3%/0.8% stable from prior DEXA 2013   Raynaud's disease 06/17/2011    Recovering alcoholic in remission     S/P LASIK (laser assisted in situ keratomileusis) 08/02/2020   right eye   Spondylolisthesis of lumbar region 02/10/2021   Status post total right knee replacement 10/31/2019   Tubular adenoma of colon 04/21/2016    Past Surgical History:  Procedure Laterality Date   AUGMENTATION MAMMAPLASTY     BREAST ENHANCEMENT SURGERY  2006   COSMETIC SURGERY     EYE SURGERY     lasik od   GYNECOLOGIC CRYOSURGERY  1990   LAPAROTOMY N/A 04/22/2018   Procedure: EXPLORATORY LAPAROTOMY;  Surgeon: Erroll Luna, MD;  Location: [place];  Service: General;  Laterality: N/A;   LIGAMENT REPAIR Right 09/24/2015   Procedure: RIGHT INDEX METACARPAL PHALANGEAL RADIAL COLLATERAL LIGAMENT REPAIR;  Surgeon: Leanora Cover, MD;  Location: [place];  Service: Orthopedics;  Laterality: Right;   MASS EXCISION N/A 08/04/2018   Procedure: REMOVAL OF SUTURE FROM ABDOMEN/ERAS PATHWAY;  Surgeon: Erroll Luna, MD;  Location: [place];  Service: General;  Laterality: N/A;   OOPHORECTOMY     BSO   Right knee arthoscopy Right 09/09/2017   [facility]   SHX 1 Left 02/19/2020   basel cell carcinoma   SHX1 Right 12/11/2020   basal cell carinoma,micronodular pattern   SKIN BIOPSY Right 02/28/2019   Superficial basal carcinoma    TONSILLECTOMY AND ADENOIDECTOMY     TOTAL KNEE ARTHROPLASTY Right 04/12/2018   TOTAL KNEE ARTHROPLASTY Right 04/12/2018   Procedure: RIGHT TOTAL KNEE ARTHROPLASTY;  Surgeon: Melrose Nakayama, MD;  Location: [facility]  OR;  Service: Orthopedics;  Laterality: Right;   TUBAL LIGATION     UPPER GASTROINTESTINAL ENDOSCOPY  04/24/05   VAGINAL HYSTERECTOMY  2005   TVH BSO  adenomyosis    Family History  Problem Relation Age of Onset   Hypertension Mother    Dementia Mother        rapid decline; concerns for Creutzfeldt-Jakob disease   Lymphoma Father 50   Breast cancer Sister 28   Colon cancer Neg Hx    Social History:  reports  that she quit smoking about 15 years ago. Her smoking use included cigarettes. She has never used smokeless tobacco. She reports that she does not currently use alcohol. She reports that she does not currently use drugs after having used the following drugs: Oxycodone.  Allergies:  Allergies  Allergen Reactions   Morphine And Related Nausea Only    Medications Prior to Admission  Medication Sig Dispense Refill   acetaminophen (TYLENOL) 500 MG tablet Take 1,000 mg by mouth every 6 (six) hours as needed.     b complex vitamins tablet Take 1 tablet by mouth daily. (Patient not taking: Reported on 08/27/2021)     bisacodyl (DULCOLAX) 5 MG EC tablet Take 1 tablet (5 mg total) by mouth daily as needed for moderate constipation. 15 tablet 0   cholecalciferol (VITAMIN D) 1000 UNITS tablet Take 1,000 Units by mouth daily. (Patient not taking: Reported on 08/27/2021)     cycloSPORINE (RESTASIS) 0.05 % ophthalmic emulsion Place 1 drop into both eyes 2 (two) times daily.     gabapentin (NEURONTIN) 600 MG tablet Take 600-1,200 mg by mouth See admin instructions. Take 600 mg by mouth in the morning and 1200 mg at night     hydrocortisone (PROCTOZONE-HC) 2.5 % rectal cream Place 1 application rectally 2 (two) times daily. (Patient not taking: Reported on 08/27/2021) 30 g 0   ibuprofen (ADVIL,MOTRIN) 800 MG tablet Take 1 tablet (800 mg total) by mouth every 8 (eight) hours as needed. (Patient not taking: Reported on 08/27/2021) 60 tablet 1   lamoTRIgine (LAMICTAL) 100 MG tablet Take 100 mg by mouth 2 (two) times daily.   0   simvastatin (ZOCOR) 20 MG tablet TAKE 1 TABLET(20 MG) BY MOUTH DAILY 90 tablet 0   traZODone (DESYREL) 50 MG tablet Take 50 mg by mouth at bedtime.     vitamin C (ASCORBIC ACID) 500 MG tablet Take 500 mg by mouth daily. (Patient not taking: Reported on 08/27/2021)     zinc gluconate 50 MG tablet Take 50 mg by mouth daily. (Patient not taking: Reported on 08/27/2021)     docusate sodium  (COLACE) 100 MG capsule Take 1 capsule (100 mg total) by mouth 2 (two) times daily. (Patient not taking: No sig reported) 30 capsule 0   methocarbamol (ROBAXIN) 500 MG tablet Take 1 tablet (500 mg total) by mouth every 6 (six) hours as needed for muscle spasms. (Patient not taking: No sig reported) 20 tablet 0   polyethylene glycol (MIRALAX) 17 g packet Take 17 g by mouth daily. (Patient not taking: No sig reported) 14 each 0   promethazine (PHENERGAN) 25 MG suppository Place 1 suppository (25 mg total) rectally every 6 (six) hours as needed for nausea. (Patient not taking: No sig reported) 12 suppository 1    No results found for this or any previous visit (from the past 48 hour(s)). No results found.  ROS All positives and negatives are listed HPI above  Blood pressure (!) 160/67,  pulse 66, temperature 98.3 F (36.8 C), temperature source Oral, resp. rate 18, height 5\' 5"  (1.651 m), weight 51.3 kg, SpO2 100 %. Physical Exam  Awake alert oriented x3 PERRLA EOMI Cranial nerves II through XII intact Bilateral upper extremities 5/5 Right lower extremity 5/5 except for knee extension and dorsiflexion 4+/5 Left lower extremity 5/5 throughout Sensory intact light touch  Assessment/Plan 65 year old female with  Degenerative mobile L4-5 spondylolisthesis with severe stenosis  -OR today for L4-5 TLIF, right-sided approach.  All risks, benefits and expected outcomes were discussed and agreed upon with the patient.  Alternatives were also discussed.  She agrees to proceed with surgical intervention.  Thank you for allowing me to participate in this patient's care.  Please do not hesitate to call with questions or concerns.   Elwin Sleight, [facility] Cell: [phone]

## 2021-09-03 NOTE — Op Note (Signed)
Providing Compassionate, Quality Care - Together  Date of service: 09/03/2021  PREOP DIAGNOSIS:  L4-5 mobile spondylolisthesis, with severe stenosis and right lower extremity radiculopathy   POSTOP DIAGNOSIS: Same   PROCEDURE: Minimally invasive L4-5 decompression and transforaminal lumbar interbody fusion and arthrodesis, right sided approach Bilateral L4, L5 nonsegmental pedicle screw instrumentation, NuVasive 6.5 x 55 mm screws at L4, 7.5 x 45 mm screws at L5 Placement of anterior biomechanical device, NuVasive titanium 7 mm 8 degree lordotic interbody Intraoperative use of autograft, same incision Intraoperative use of allograft  Intraoperative use of BMP, extra small Intraoperative use of fluoroscopy, greater than 1 hour Intraoperative use of microscope, for microdissection   SURGEON: Dr. Pieter Partridge C. Rafaella Kole, DO   ASSISTANT: Weston Brass, NP; Verdis Prime, RN   ANESTHESIA: General Endotracheal   EBL: 50 cc   SPECIMENS: None   DRAINS: None   COMPLICATIONS: None   CONDITION: Hemodynamically stable   HISTORY: Bethany [name] is a 65 y.o. y.o. female who initially presented to the outpatient clinic with signs and symptoms consistent with lumbar spondylosis. MRI demonstrated severe stenosis at L4-5 with a mobile spondylolisthesis on x-ray.  There is significant bilateral facet hypertrophy.  She underwent conservative measures but failed. Treatment options were discussed including continue conservative measures, epidural steroid injections or surgical intervention in the form of a L4-5 decompression and fusion. All risks, benefits and expected outcomes were discussed agreed upon.   PROCEDURE IN DETAIL: The patient was brought to the operating room. After induction of general anesthesia, the patient was positioned on the operative table in the prone position on the [place] table. All pressure points were meticulously padded. Skin incision was then marked out and prepped and  draped in the usual sterile fashion. Physician driven timeout was performed.   Using biplanar fluoroscopy, the C arm for sterilely draped and brought into the field and the paramedian incisions were planned over the L4-5 interspace.  Local anesthetic was injected bilaterally.  Using a 10 blade, paramedian incision was created bilaterally.  Using Jamshidi, the bilateral L4 pedicles were accessed under biplanar fluoroscopy.  K wires were placed and the Jamshidi's were removed.  This was repeated bilaterally at L5.  Attention was then turned to docking the Metrix dilator.  Using with a series of dilators, on the patient's right side the facet lamina junction was docked with a 26 x 6 mm tube.  This was confirmed to be in the appropriate trajectory under lateral fluoroscopy.  At this point the microscope was sterilely draped and brought into the field.   Using Bovie electrocautery, soft tissue was cleared from the lamina and facet at L4-5.  Using a high-speed drill and collecting the autograft, a laminectomy and complete facetectomy was performed down to the ligamentum flavum.  This autograft was saved for later.  The ligamentum flavum was identified to its attachment along the superior lamina and lateral pars.  Using a series of micro curettes, the ligamentum flavum was gently elevated and the epidural space was identified.  The ligamentum flavum was completely resected using Kerrison rongeurs.  The traversing and exiting nerve roots were identified.  Using Kerrison rongeurs, the common dural tube and traversing and exiting nerve roots were completely decompressed from the ligamentum flavum.  They appeared pulsatile and noncompressed.  Camden's triangle was identified, the traversing nerve root was gently retracted medially, and the epidural space was coagulated and cleared of epidural veins.  The disc space was identified.  The disc annulus was then  coagulated and incised with an 11 blade.  Using Kerrison rongeurs  the annulotomy was widened.  Using a series of disc prep shavers and curettes a radical discectomy was performed to subchondral bleeding bone at both endplates.  The disc base was then trialed and found to be in 7 mm mm interbody size.  A mixture of autograft and allograft was then placed anteriorly and medially and packed with a bone tamp.  Using a nerve root retractor, the traversing nerve root was gently protected during placement of the interbody.   Using lateral fluoroscopy, the interbody was then placed under live fluoroscopy.  Appropriate placement was identified.  The remainder of the autograft and allograft was then placed laterally and the intervertebral space to the interbody device.  Upon loading retraction of the lateral common dural tube, there were 3 small pinhole durotomy's with egress of CSF.  These were tamponaded with Gelfoam.  Adherus glue was then placed over the Gelfoam.  The wound was monitored for series of minutes and was noted to be excellently hemostatic and there was no CSF egress whatsoever.  Hemostasis was achieved with passive hemostatics and bipolar cautery.  The traversing nerve root,, neural tube and exiting nerve root were followed with a ball-tipped probe and noted to be noncompressed, pulsatile and in their normal position.  A few pieces of Gelfoam with thrombin were placed over the thecal sac.  The Metrix dilator tube was gently removed and hemostasis was achieved with bipolar cautery and the soft tissues.   The above listed pedicle screws were then placed under lateral fluoroscopy over the K wires bilaterally at L4, L5.  Appropriate sized rods were measured, contoured and placed percutaneously.  These were confirmed to be within the t[place]ips, setscrews were placed and final tightened to the manufacturer's recommendations.  Percutaneous towers were removed from the screws.  Final AP and lateral fluoroscopy images confirmed appropriate placement of all hardware.  Hemostasis was  achieved with bipolar cautery.  The wound was closed in layers, 2-0 and 3-0 Vicryl sutures for the dermis.  Skin was closed with skin glue.  Sterile dressing was applied.   At the end of the case all sponge, needle, and instrument counts were correct. The patient was then transferred to the stretcher, extubated, and taken to the post-anesthesia care unit in stable hemodynamic condition.

## 2021-09-03 NOTE — Anesthesia Procedure Notes (Signed)
Procedure Name: Intubation Date/Time: 09/03/2021 1:48 PM Performed by: Janace Litten, CRNA Pre-anesthesia Checklist: Patient identified, Emergency Drugs available, Suction available and Patient being monitored Patient Re-evaluated:Patient Re-evaluated prior to induction Oxygen Delivery Method: Circle System Utilized Preoxygenation: Pre-oxygenation with 100% oxygen Induction Type: IV induction Ventilation: Mask ventilation without difficulty Laryngoscope Size: Mac and 3 Grade View: Grade II Tube type: Oral Tube size: 7.0 mm Number of attempts: 1 Airway Equipment and Method: Stylet and Oral airway Placement Confirmation: ETT inserted through vocal cords under direct vision, positive ETCO2 and breath sounds checked- equal and bilateral Secured at: 21 cm Tube secured with: Tape Dental Injury: Teeth and Oropharynx as per pre-operative assessment

## 2021-09-03 NOTE — Anesthesia Preprocedure Evaluation (Signed)
Anesthesia Evaluation  Patient identified by MRN, date of birth, ID band Patient awake    Reviewed: Allergy & Precautions, NPO status , Patient's Chart, lab work & pertinent test results  Airway Mallampati: II  TM Distance: >3 FB Neck ROM: Full    Dental  (+) Teeth Intact   Pulmonary neg pulmonary ROS, former smoker,    Pulmonary exam normal        Cardiovascular negative cardio ROS   Rhythm:Regular Rate:Normal     Neuro/Psych Anxiety Depression negative neurological ROS     GI/Hepatic negative GI ROS, Neg liver ROS,   Endo/Other  negative endocrine ROS  Renal/GU negative Renal ROS  negative genitourinary   Musculoskeletal  (+) Arthritis , Osteoarthritis,  spondylolisthesis   Abdominal (+)  Abdomen: soft. Bowel sounds: normal.  Peds  Hematology negative hematology ROS (+)   Anesthesia Other Findings   Reproductive/Obstetrics                             Anesthesia Physical Anesthesia Plan  ASA: 2  Anesthesia Plan: General   Post-op Pain Management:    Induction: Intravenous  PONV Risk Score and Plan: 3 and Ondansetron, Dexamethasone, Midazolam and Treatment may vary due to age or medical condition  Airway Management Planned: Mask and Oral ETT  Additional Equipment: None  Intra-op Plan:   Post-operative Plan: Extubation in OR  Informed Consent: I have reviewed the patients History and Physical, chart, labs and discussed the procedure including the risks, benefits and alternatives for the proposed anesthesia with the patient or authorized representative who has indicated his/her understanding and acceptance.     Dental advisory given  Plan Discussed with: CRNA  Anesthesia Plan Comments: (Lab Results      Component                Value               Date                      WBC                      8.5                 08/26/2021                HGB                       12.8                08/26/2021                HCT                      38.8                08/26/2021                MCV                      102.6 (H)           08/26/2021                PLT                      201  08/26/2021           Lab Results      Component                Value               Date                      NA                       140                 06/24/2021                K                        3.5                 06/24/2021                CO2                      32                  06/24/2021                GLUCOSE                  112 (H)             06/24/2021                BUN                      23                  06/24/2021                CREATININE               0.94                06/24/2021                CALCIUM                  9.9                 06/24/2021                EGFR                     55 (L)              03/10/2021                GFRNONAA                 72                  10/18/2020          )        Anesthesia Quick Evaluation

## 2021-09-04 ENCOUNTER — Encounter (HOSPITAL_COMMUNITY): Payer: Self-pay | Admitting: Neurological Surgery

## 2021-09-04 NOTE — Evaluation (Signed)
Occupational Therapy Evaluation Patient Details Name: Bethany [name] MRN: [id number] DOB: 12/27/1955 Today's Date: 09/04/2021   History of Present Illness 65 y/o F with chronic low back adn RLE pain, s/o L4-5 decompression and TLIF on 11/2. PMH inclues ADHD and R TKA.   Clinical Impression   Prior to admission, pt independent with ADLs and functional mobility. Lives in single story home with spouse, 1 step to enter. Currently, pt mod I with ADLs and ambulating household distances. Educated pt on back precautions and provided handout, pt verbalized understanding and able to adhere to precautions while completing ADLs/functional mobility. Pt currently limited by pain (6/10 at rest, progressed to 8/10 during STS transfer) and ROM restrictions, however has not acute OT needs, will s/o. Recommend d/c home with assistance from spouse.      Recommendations for follow up therapy are one component of a multi-disciplinary discharge planning process, led by the attending physician.  Recommendations may be updated based on patient status, additional functional criteria and insurance authorization.   Follow Up Recommendations  No OT follow up    Assistance Recommended at Discharge Intermittent Supervision/Assistance  Functional Status Assessment  Patient has had a recent decline in their functional status and demonstrates the ability to make significant improvements in function in a reasonable and predictable amount of time.  Equipment Recommendations  None recommended by OT    Recommendations for Other Services PT consult     Precautions / Restrictions Precautions Precautions: Fall;Back Precaution Comments: pt verbalized understanding of back precautions Restrictions Weight Bearing Restrictions: No      Mobility Bed Mobility Overal bed mobility: Modified Independent             General bed mobility comments: required v/cing to log roll supin > sit    Transfers Overall transfer  level: Modified independent Equipment used: None                      Balance Overall balance assessment: Modified Independent                                         ADL either performed or assessed with clinical judgement   ADL Overall ADL's : Modified independent                                       General ADL Comments: Pt able to simulate LE dressing and toilet transfer mod I in room, pt required v/c to increase step length as pt takes short steps     Vision Baseline Vision/History: 0 No visual deficits       Perception     Praxis      Pertinent Vitals/Pain Pain Assessment: 0-10 Pain Score: 6  (pain increasing to 8 during short ambulation in hallway) Pain Location: back at surgical site Pain Descriptors / Indicators: Discomfort;Constant Pain Intervention(s): Limited activity within patient's tolerance;Premedicated before session;Repositioned     Hand Dominance     Extremity/Trunk Assessment Upper Extremity Assessment Upper Extremity Assessment: Defer to OT evaluation   Lower Extremity Assessment Lower Extremity Assessment: Defer to PT evaluation   Cervical / Trunk Assessment Cervical / Trunk Assessment: Back Surgery   Communication Communication Communication: No difficulties   Cognition Arousal/Alertness: Awake/alert Behavior During Therapy: WFL for tasks assessed/performed Overall Cognitive Status: Within  Functional Limits for tasks assessed                                       General Comments       Exercises     Shoulder Instructions      Home Living Family/patient expects to be discharged to:: Private residence Living Arrangements: Spouse/significant other Available Help at Discharge: Friend(s) Type of Home: House Home Access: Stairs to enter Technical brewer of Steps: 1 Entrance Stairs-Rails: None Home Layout: One level     Bathroom Shower/Tub: Medical illustrator: Standard     Home Equipment: None          Prior Functioning/Environment Prior Level of Function : Independent/Modified Independent                        OT Problem List: Pain;Decreased range of motion      OT Treatment/Interventions:      OT Goals(Current goals can be found in the care plan section) Acute Rehab OT Goals Patient Stated Goal: return home OT Goal Formulation: With patient Time For Goal Achievement: 09/18/21 Potential to Achieve Goals: Good  OT Frequency:     Barriers to D/C:            Co-evaluation              AM-PAC OT "6 Clicks" Daily Activity     Outcome Measure Help from another person eating meals?: None Help from another person taking care of personal grooming?: None Help from another person toileting, which includes using toliet, bedpan, or urinal?: None Help from another person bathing (including washing, rinsing, drying)?: A Little Help from another person to put on and taking off regular upper body clothing?: None Help from another person to put on and taking off regular lower body clothing?: A Little 6 Click Score: 22   End of Session Equipment Utilized During Treatment: Gait belt Nurse Communication: Mobility status  Activity Tolerance: Patient tolerated treatment well;Patient limited by pain Patient left: in chair;with call bell/phone within reach  OT Visit Diagnosis: Unsteadiness on feet (R26.81);Pain;Other abnormalities of gait and mobility (R26.89);Muscle weakness (generalized) (M62.81)                Time: 2229-7989 OT Time Calculation (min): 15 min Charges:  OT General Charges $OT Visit: 1 Visit OT Evaluation $OT Eval Low Complexity: 1 Low  Lynnda Child, OTD, OTR/L Acute Rehab [phone]) [phone] - [place] 09/04/2021, 8:37 AM

## 2021-09-04 NOTE — Progress Notes (Signed)
Subjective: Patient reports that she is doing well. She has had a resolution of her RLE radiculopathy. Her only complaint is of incisional discomfort. No acute events overnight.   Objective: Vital signs in last 24 hours: Temp:  [97.7 F (36.5 C)-98.3 F (36.8 C)] 97.9 F (36.6 C) (11/03 0739) Pulse Rate:  [66-83] 73 (11/03 0739) Resp:  [10-20] 20 (11/03 0739) BP: (101-160)/(54-77) 120/62 (11/03 0739) SpO2:  [97 %-100 %] 98 % (11/03 0739) Weight:  [51.3 kg] 51.3 kg (11/02 1110)  Intake/Output from previous day: 11/02 0701 - 11/03 0700 In: 1320 [P.O.:120; I.V.:1000; IV Piggyback:200] Out: 100 [Blood:100] Intake/Output this shift: No intake/output data recorded.  Physical Exam: Patient is awake, A/O X 4, conversant, and in good spirits. They are in NAD and VSS. Doing well. Speech is fluent and appropriate. MAEW with good strength that is symmetric bilaterally. Sensation to light touch is intact. PERLA, EOMI. CNs grossly intact. Dressing is clean dry intact. Incision is well approximated with no drainage, erythema, or edema.   Lab Results: No results for input(s): WBC, HGB, HCT, PLT in the last 72 hours. BMET No results for input(s): NA, K, CL, CO2, GLUCOSE, BUN, CREATININE, CALCIUM in the last 72 hours.  Studies/Results: DG Lumbar Spine 2-3 Views  Result Date: 09/03/2021 CLINICAL DATA:  Back surgery. L4-5 transforaminal lumbar interbody fusion. EXAM: LUMBAR SPINE - 2-3 VIEW COMPARISON:  MRI lumbar spine 10/30/2020 FINDINGS: Grade 2 anterolisthesis L4-5 is stable on flexion extension views. Based on the prior MRI this appears to be due to advanced facet degeneration. Remaining alignment normal.  Negative for fracture. IMPRESSION: Grade 2 anterolisthesis L4-5. No instability on flexion or extension. Electronically Signed   By: Franchot Gallo M.D.   On: 09/03/2021 19:27   DG C-Arm 1-60 Min-No Report  Result Date: 09/03/2021 Fluoroscopy was utilized by the requesting physician.  No  radiographic interpretation.   DG C-Arm 1-60 Min-No Report  Result Date: 09/03/2021 Fluoroscopy was utilized by the requesting physician.  No radiographic interpretation.   DG C-Arm 1-60 Min-No Report  Result Date: 09/03/2021 Fluoroscopy was utilized by the requesting physician.  No radiographic interpretation.    Assessment/Plan: Patient is post-op day 1 s/p L4/5 TLIF. She is recovering well and reports a resolution of her RLE radiculopathy.  Her only complaint is of incisional discomfort. She is ambulating well and has worked with PT. She is awaiting OT evaluation. Continue working on pain control, mobility and ambulating patient. Will plan for discharge tomorrow.    LOS: 1 day     Marvis Moeller, DNP, NP-C 09/04/2021, 8:08 AM

## 2021-09-04 NOTE — Anesthesia Postprocedure Evaluation (Signed)
Anesthesia Post Note  Patient: Bethany [name]  Procedure(s) Performed: Lumbar four-five Minimally invasive transforaminal lumbar interbody fusion     Patient location during evaluation: PACU Anesthesia Type: General Level of consciousness: awake and alert Pain management: pain level controlled Vital Signs Assessment: post-procedure vital signs reviewed and stable Respiratory status: spontaneous breathing, nonlabored ventilation, respiratory function stable and patient connected to nasal cannula oxygen Cardiovascular status: blood pressure returned to baseline and stable Postop Assessment: no apparent nausea or vomiting Anesthetic complications: no   No notable events documented.  Last Vitals:  Vitals:   09/03/21 2308 09/04/21 0407  BP: (!) 106/54 101/62  Pulse: 70 68  Resp: 18 18  Temp: 36.6 C 36.6 C  SpO2: 99% 100%    Last Pain:  Vitals:   09/04/21 0451  TempSrc:   PainSc: Asleep                 March Rummage Lessa Huge

## 2021-09-04 NOTE — Evaluation (Signed)
Physical Therapy Evaluation Patient Details Name: JALAILA CARADONNA MRN: [id number] DOB: 1956/03/03 Today's Date: 09/04/2021  History of Present Illness  65 y/o F with chronic low back adn RLE pain, s/o L4-5 decompression and TLIF on 11/2. PMH inclues ADHD and R TKA.  Clinical Impression  Pt presents to PT with deficits in gait, power, balance, endurance, and with pain. Pt reports back pain which limits her tolerance for mobility at this time. Pt is able to verbalize back precautions and performs log roll technique well. Pt will benefit from continued acute PT services to improve activity tolerance and to progress to dynamic gait and balance activities. PT recommends discharge home when medically ready.       Recommendations for follow up therapy are one component of a multi-disciplinary discharge planning process, led by the attending physician.  Recommendations may be updated based on patient status, additional functional criteria and insurance authorization.  Follow Up Recommendations No PT follow up    Assistance Recommended at Discharge Intermittent Supervision/Assistance  Functional Status Assessment Patient has had a recent decline in their functional status and demonstrates the ability to make significant improvements in function in a reasonable and predictable amount of time.  Equipment Recommendations  None recommended by PT    Recommendations for Other Services       Precautions / Restrictions Precautions Precautions: Fall;Back Precaution Booklet Issued: Yes (comment) Precaution Comments: pt verbalized understanding of back precautions Required Braces or Orthoses:  (no brace needs) Restrictions Weight Bearing Restrictions: No      Mobility  Bed Mobility Overal bed mobility: Modified Independent             General bed mobility comments: increased time, use of rail    Transfers Overall transfer level: Independent Equipment used: None                     Ambulation/Gait Ambulation/Gait assistance: Supervision Gait Distance (Feet): 300 Feet Assistive device: None Gait Pattern/deviations: Step-through pattern Gait velocity: functional Gait velocity interpretation: 1.31 - 2.62 ft/sec, indicative of limited community ambulator General Gait Details: pt with slowed step-through gait  Stairs Stairs: Yes Stairs assistance: Supervision Stair Management: No rails;Forwards;Step to pattern Number of Stairs: 2    Wheelchair Mobility    Modified Rankin (Stroke Patients Only)       Balance Overall balance assessment: Mild deficits observed, not formally tested                                           Pertinent Vitals/Pain Pain Assessment: Faces Pain Score: 6  (pain increasing to 8 during short ambulation in hallway) Faces Pain Scale: Hurts little more Pain Location: back Pain Descriptors / Indicators: Sore Pain Intervention(s): Monitored during session    Home Living Family/patient expects to be discharged to:: Private residence Living Arrangements: Spouse/significant other Available Help at Discharge: Friend(s);Family Type of Home: House Home Access: Stairs to enter Entrance Stairs-Rails: None Entrance Stairs-Number of Steps: 2   Home Layout: One level Home Equipment: None      Prior Function Prior Level of Function : Independent/Modified Independent             Mobility Comments: pt likes to run and play pickleball       Hand Dominance   Dominant Hand: Right    Extremity/Trunk Assessment   Upper Extremity Assessment Upper Extremity Assessment: Overall [facility]  for tasks assessed    Lower Extremity Assessment Lower Extremity Assessment: Overall WFL for tasks assessed    Cervical / Trunk Assessment Cervical / Trunk Assessment: Back Surgery  Communication   Communication: No difficulties  Cognition Arousal/Alertness: Awake/alert Behavior During Therapy: WFL for tasks  assessed/performed Overall Cognitive Status: Within Functional Limits for tasks assessed                                          General Comments General comments (skin integrity, edema, etc.): VSS on RA    Exercises     Assessment/Plan    PT Assessment Patient needs continued PT services  PT Problem List Decreased activity tolerance;Decreased balance;Decreased mobility;Pain       PT Treatment Interventions Gait training;Stair training;Functional mobility training;Therapeutic activities;Therapeutic exercise;Balance training;Neuromuscular re-education;Patient/family education    PT Goals (Current goals can be found in the Care Plan section)  Acute Rehab PT Goals Patient Stated Goal: to go home PT Goal Formulation: With patient Time For Goal Achievement: 09/09/21 Potential to Achieve Goals: Good    Frequency Min 5X/week   Barriers to discharge        Co-evaluation               AM-PAC PT "6 Clicks" Mobility  Outcome Measure Help needed turning from your back to your side while in a flat bed without using bedrails?: None Help needed moving from lying on your back to sitting on the side of a flat bed without using bedrails?: None Help needed moving to and from a bed to a chair (including a wheelchair)?: None Help needed standing up from a chair using your arms (e.g., wheelchair or bedside chair)?: None Help needed to walk in hospital room?: A Little Help needed climbing 3-5 steps with a railing? : A Little 6 Click Score: 22    End of Session   Activity Tolerance: Patient tolerated treatment well Patient left: in bed;with call bell/phone within reach Nurse Communication: Mobility status PT Visit Diagnosis: Other abnormalities of gait and mobility (R26.89);Pain Pain - part of body:  (back)    Time: 1610-9604 PT Time Calculation (min) (ACUTE ONLY): 12 min   Charges:   PT Evaluation $PT Eval Low Complexity: 1 Low          Zenaida Niece,  PT, DPT Acute Rehabilitation Pager: [phone] Office [phone]   Zenaida Niece 09/04/2021, 9:44 AM

## 2021-09-05 MED ORDER — OXYCODONE-ACETAMINOPHEN 5-325 MG PO TABS: 1.0000 | ORAL_TABLET | ORAL | 0 refills | Status: DC | PRN

## 2021-09-05 MED ORDER — METHOCARBAMOL 500 MG PO TABS: 500.0000 mg | ORAL_TABLET | Freq: Four times a day (QID) | ORAL | 0 refills | Status: DC

## 2021-09-05 NOTE — Plan of Care (Signed)
  Problem: Education: Goal: Ability to verbalize activity precautions or restrictions will improve Outcome: Progressing   Problem: Activity: Goal: Ability to avoid complications of mobility impairment will improve Outcome: Progressing Goal: Ability to tolerate increased activity will improve Outcome: Progressing Goal: Will remain free from falls Outcome: Progressing   Problem: Clinical Measurements: Goal: Ability to maintain clinical measurements within normal limits will improve Outcome: Progressing Goal: Postoperative complications will be avoided or minimized Outcome: Progressing

## 2021-09-05 NOTE — Discharge Summary (Signed)
Physician Discharge Summary  Patient ID: Bethany [name] MRN: [id number] DOB/AGE: 07-14-1956 65 y.o.  Admit date: 09/03/2021 Discharge date: 09/05/2021  Admission Diagnoses: L4-5 mobile spondylolisthesis, with severe stenosis and right lower extremity radiculopathy  Discharge Diagnoses: L4-5 mobile spondylolisthesis, with severe stenosis and right lower extremity radiculopathy   Active Problems:   Spondylolisthesis, lumbar region   Discharged Condition: good  Hospital Course: The patient was admitted on 09/03/2021 and taken to the operating room where the patient underwent L4/5 TLIF. The patient tolerated the procedure well and was taken to the recovery room and then to the floor in stable condition. The hospital course was routine. There were no complications. The wound remained clean dry and intact. Pt had appropriate back soreness. No complaints of leg pain or new N/T/W. The patient remained afebrile with stable vital signs, and tolerated a regular diet. The patient continued to increase activities, and pain was well controlled with oral pain medications.   Consults: None  Significant Diagnostic Studies: radiology: X-Ray: intraoperative   Treatments: surgery:  Minimally invasive L4-5 decompression and transforaminal lumbar interbody fusion and arthrodesis, right sided approach Bilateral L4, L5 nonsegmental pedicle screw instrumentation, NuVasive 6.5 x 55 mm screws at L4, 7.5 x 45 mm screws at L5 Placement of anterior biomechanical device, NuVasive titanium 7 mm 8 degree lordotic interbody Intraoperative use of autograft, same incision Intraoperative use of allograft  Intraoperative use of BMP, extra small Intraoperative use of fluoroscopy, greater than 1 hour Intraoperative use of microscope, for microdissection  Discharge Exam: Blood pressure (!) 114/57, pulse 81, temperature 99.4 F (37.4 C), temperature source Oral, resp. rate 18, height 5\' 5"  (1.651 m), weight 51.3 kg, SpO2  97 %.  Physical Exam: Patient is awake, A/O X 4, conversant, and in good spirits. They are in NAD. VSS. Speech is fluent and appropriate. MAEW with good strength that is symmetric bilaterally. Sensation to light touch is intact. PERLA, EOMI. CNs grossly intact. Dressings are clean dry intact. Incisions are well approximated with no drainage, erythema, or edema.    Disposition: Discharge disposition: 01-Home or Self Care       Discharge Instructions     Incentive spirometry RT   Complete by: As directed       Allergies as of 09/05/2021       Reactions   Morphine And Related Nausea Only        Medication List     STOP taking these medications    ibuprofen 800 MG tablet Commonly known as: ADVIL       TAKE these medications    acetaminophen 500 MG tablet Commonly known as: TYLENOL Take 1,000 mg by mouth every 6 (six) hours as needed.   b complex vitamins tablet Take 1 tablet by mouth daily.   bisacodyl 5 MG EC tablet Commonly known as: DULCOLAX Take 1 tablet (5 mg total) by mouth daily as needed for moderate constipation.   cholecalciferol 1000 units tablet Commonly known as: VITAMIN D Take 1,000 Units by mouth daily.   docusate sodium 100 MG capsule Commonly known as: COLACE Take 1 capsule (100 mg total) by mouth 2 (two) times daily.   gabapentin 600 MG tablet Commonly known as: NEURONTIN Take 600-1,200 mg by mouth See admin instructions. Take 600 mg by mouth in the morning and 1200 mg at night   hydrocortisone 2.5 % rectal cream Commonly known as: Proctozone-HC Place 1 application rectally 2 (two) times daily.   lamoTRIgine 100 MG tablet Commonly known as: LAMICTAL  Take 100 mg by mouth 2 (two) times daily.   methocarbamol 500 MG tablet Commonly known as: Robaxin Take 1 tablet (500 mg total) by mouth every 6 (six) hours as needed for muscle spasms. What changed: Another medication with the same name was added. Make sure you understand how and  when to take each.   methocarbamol 500 MG tablet Commonly known as: Robaxin Take 1 tablet (500 mg total) by mouth 4 (four) times daily. What changed: You were already taking a medication with the same name, and this prescription was added. Make sure you understand how and when to take each.   oxyCODONE-acetaminophen 5-325 MG tablet Commonly known as: Percocet Take 1-2 tablets by mouth every 4 (four) hours as needed for severe pain.   polyethylene glycol 17 g packet Commonly known as: MiraLax Take 17 g by mouth daily.   promethazine 25 MG suppository Commonly known as: Phenergan Place 1 suppository (25 mg total) rectally every 6 (six) hours as needed for nausea.   Restasis 0.05 % ophthalmic emulsion Generic drug: cycloSPORINE Place 1 drop into both eyes 2 (two) times daily.   simvastatin 20 MG tablet Commonly known as: ZOCOR TAKE 1 TABLET(20 MG) BY MOUTH DAILY   traZODone 50 MG tablet Commonly known as: DESYREL Take 50 mg by mouth at bedtime.   vitamin C 500 MG tablet Commonly known as: ASCORBIC ACID Take 500 mg by mouth daily.   zinc gluconate 50 MG tablet Take 50 mg by mouth daily.         Signed: Marvis Moeller, DNP, NP-C 09/05/2021, 8:53 AM

## 2021-09-05 NOTE — Progress Notes (Signed)
PT Cancellation Note  Patient Details Name: Bethany [name] MRN: [id number] DOB: 02/13/1956   Cancelled Treatment:    Reason Eval/Treat Not Completed: Patient declined, no reason specified;PT screened, no needs identified, will sign off. PT attempts to see pt for therapy this morning, pt received ambulating in the room independently. Pt reports frequent ambulation in hallway independently. Pt denies concerns about mobility at this time and demonstrates the ability to negotiate steps yesterday. Pt has no further acute PT needs at this time. Acute PT signing off.   Zenaida Niece 09/05/2021, 8:37 AM

## 2021-09-05 NOTE — Progress Notes (Signed)
Orthopedic Tech Progress Note Patient Details:  Bethany [name] [birth year] [id number] Patient did not want LSO brace. Said it was uncomfortable Patient ID: Bethany [name], female   DOB: Aug 27, 1956, 65 y.o.   MRN: [id number]  [name] 09/05/2021, 8:29 AM

## 2021-09-05 NOTE — Plan of Care (Signed)
Pt doing well. Pt given D/C instructions with verbal understanding. Rx's were sent to the pharmacy by MD. Pt's incision is clean and dry with no sign of infection. Pt's IV was removed prior to D/C. Pt D/C'd home via wheelchair per MD order. Pt is stable @ D/c and has no other needs at this time. Holli Humbles, RN

## 2021-09-17 DIAGNOSIS — F411 Generalized anxiety disorder: Secondary | ICD-10-CM | POA: Diagnosis not present

## 2021-09-17 DIAGNOSIS — F1121 Opioid dependence, in remission: Secondary | ICD-10-CM | POA: Diagnosis not present

## 2021-09-17 DIAGNOSIS — F502 Bulimia nervosa: Secondary | ICD-10-CM | POA: Diagnosis not present

## 2021-09-18 DIAGNOSIS — M4316 Spondylolisthesis, lumbar region: Secondary | ICD-10-CM | POA: Diagnosis not present

## 2021-09-22 DIAGNOSIS — H16223 Keratoconjunctivitis sicca, not specified as Sjogren's, bilateral: Secondary | ICD-10-CM | POA: Diagnosis not present

## 2021-09-22 DIAGNOSIS — H43813 Vitreous degeneration, bilateral: Secondary | ICD-10-CM | POA: Diagnosis not present

## 2021-09-22 DIAGNOSIS — H43392 Other vitreous opacities, left eye: Secondary | ICD-10-CM | POA: Diagnosis not present

## 2021-09-22 DIAGNOSIS — H02882 Meibomian gland dysfunction right lower eyelid: Secondary | ICD-10-CM | POA: Diagnosis not present

## 2021-10-01 DIAGNOSIS — F411 Generalized anxiety disorder: Secondary | ICD-10-CM | POA: Diagnosis not present

## 2021-10-01 DIAGNOSIS — F502 Bulimia nervosa: Secondary | ICD-10-CM | POA: Diagnosis not present

## 2021-10-01 DIAGNOSIS — F1121 Opioid dependence, in remission: Secondary | ICD-10-CM | POA: Diagnosis not present

## 2021-10-08 DIAGNOSIS — F1121 Opioid dependence, in remission: Secondary | ICD-10-CM | POA: Diagnosis not present

## 2021-10-08 DIAGNOSIS — F411 Generalized anxiety disorder: Secondary | ICD-10-CM | POA: Diagnosis not present

## 2021-10-08 DIAGNOSIS — F502 Bulimia nervosa: Secondary | ICD-10-CM | POA: Diagnosis not present

## 2021-10-15 DIAGNOSIS — F411 Generalized anxiety disorder: Secondary | ICD-10-CM | POA: Diagnosis not present

## 2021-10-15 DIAGNOSIS — F1121 Opioid dependence, in remission: Secondary | ICD-10-CM | POA: Diagnosis not present

## 2021-10-15 DIAGNOSIS — F502 Bulimia nervosa: Secondary | ICD-10-CM | POA: Diagnosis not present

## 2021-10-22 DIAGNOSIS — F411 Generalized anxiety disorder: Secondary | ICD-10-CM | POA: Diagnosis not present

## 2021-10-22 DIAGNOSIS — F502 Bulimia nervosa: Secondary | ICD-10-CM | POA: Diagnosis not present

## 2021-10-22 DIAGNOSIS — F1121 Opioid dependence, in remission: Secondary | ICD-10-CM | POA: Diagnosis not present

## 2021-10-22 DIAGNOSIS — Z1231 Encounter for screening mammogram for malignant neoplasm of breast: Secondary | ICD-10-CM | POA: Diagnosis not present

## 2021-10-22 LAB — HM MAMMOGRAPHY

## 2021-10-22 NOTE — Progress Notes (Signed)
Bethany [name] [address] [place] Phone: [phone] Subjective:   Bethany [name], am serving as a scribe for Dr. Hulan Saas. This visit occurred during the SARS-CoV-2 public health emergency.  Safety protocols were in place, including screening questions prior to the visit, additional usage of staff PPE, and extensive cleaning of exam room while observing appropriate contact time as indicated for disinfecting solutions.   I'm seeing this patient by the request  of:  Denita Lung, MD  CC: Thumb pain, back pain  QMG:[id number]  Bethany [name] is a 65 y.o. female coming in with complaint of thumb and back pain. Had lumbar fusion on 09/03/2021. Patient states had surgery in Nov and back pain is worse. Wants opinion. Left thumb pain. Felt a pop. Wants injection for the thumb.  Has responded to this previously.  Patient states that it is starting to affect daily activities.  Regarding her back is having the radiation towards the hip again.  Nothing that is going down the leg as much.  Patient states that it is highly uncomfortable most of the time.      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 All Paps normal afterwards   Degenerative disc disease, lumbar 05/02/2020   Dyslipidemia    Generalized anxiety disorder    Herpes genitalis    Herpes labialis    History of calcium pyrophosphate deposition disease (CPPD) 03/26/2020   History of opiate abuse, in remission    History of small bowel obstruction 04/17/2018   partial   Hyperlipidemia with target LDL less than 100 06/17/2011   IBS (irritable bowel syndrome)    Lumbago with sciatica, right side  02/10/2021   Major depressive disorder    Meibomian gland dysfunction (MGD) of both eyes 08/02/2020   Nonallopathic lesion of lumbosacral region 08/05/2017   Nonallopathic lesion of sacral region 08/05/2017   Nonallopathic lesion of thoracic region 08/05/2017   Osteopenia 04/2014   T score -1.7 FRAX 7.3%/0.8% stable from prior DEXA 2013   Raynaud's disease 06/17/2011   Recovering alcoholic in remission     S/P LASIK (laser assisted in situ keratomileusis) 08/02/2020   right eye   Spondylolisthesis of lumbar region 02/10/2021   Status post total right knee replacement 10/31/2019   Tubular adenoma of colon 04/21/2016   Past Surgical History:  Procedure Laterality Date   AUGMENTATION MAMMAPLASTY     BREAST ENHANCEMENT SURGERY  2006   COSMETIC SURGERY     EYE SURGERY     lasik od   GYNECOLOGIC CRYOSURGERY  1990   LAPAROTOMY N/A 04/22/2018   Procedure: EXPLORATORY LAPAROTOMY;  Surgeon: Erroll Luna, MD;  Location: [place];  Service: General;  Laterality: N/A;   LIGAMENT REPAIR Right 09/24/2015   Procedure: RIGHT INDEX METACARPAL PHALANGEAL RADIAL COLLATERAL LIGAMENT REPAIR;  Surgeon: Leanora Cover, MD;  Location: [place];  Service: Orthopedics;  Laterality: Right;   MASS EXCISION N/A 08/04/2018   Procedure: REMOVAL OF SUTURE FROM ABDOMEN/ERAS PATHWAY;  Surgeon: Erroll Luna, MD;  Location: [place];  Service: General;  Laterality: N/A;   OOPHORECTOMY     BSO   Right knee arthoscopy Right 09/09/2017   [facility]  SHX 1 Left 02/19/2020   basel cell carcinoma   SHX1 Right 12/11/2020   basal cell carinoma,micronodular pattern   SKIN BIOPSY Right 02/28/2019   Superficial basal carcinoma    TONSILLECTOMY AND ADENOIDECTOMY     TOTAL KNEE ARTHROPLASTY Right 04/12/2018   TOTAL KNEE ARTHROPLASTY Right 04/12/2018   Procedure: RIGHT TOTAL KNEE ARTHROPLASTY;  Surgeon: Melrose Nakayama, MD;  Location: [place];  Service: Orthopedics;  Laterality: Right;    TRANSFORAMINAL LUMBAR INTERBODY FUSION W/ MIS 1 LEVEL N/A 09/03/2021   Procedure: Lumbar four-five Minimally invasive transforaminal lumbar interbody fusion;  Surgeon: Dawley, Theodoro Doing, DO;  Location: [place];  Service: Neurosurgery;  Laterality: N/A;   TUBAL LIGATION     UPPER GASTROINTESTINAL ENDOSCOPY  04/24/05   VAGINAL HYSTERECTOMY  2005   TVH BSO  adenomyosis   Social History   Socioeconomic History   Marital status: Married    Spouse name: Not on file   Number of children: Not on file   Years of education: 16   Highest education level: Bachelor's degree (e.g., BA, AB, BS)  Occupational History   Occupation: Retired    Comment: Herbalist  Tobacco Use   Smoking status: Former    Types: Cigarettes    Quit date: 03/21/2006    Years since quitting: 15.6   Smokeless tobacco: Never  Vaping Use   Vaping Use: Never used  Substance and Sexual Activity   Alcohol use: Not Currently    Comment: history of alcohol abuse/dependence. Sober for past 18 years   Drug use: Not Currently    Types: Oxycodone    Comment: history of opiate abuse. Sober for past 8 months   Sexual activity: Yes    Birth control/protection: Surgical    Comment: HYST-1st intercourse 65 yo-More than 5 partners  Other Topics Concern   Not on file  Social History Narrative   Not on file   Social Determinants of Health   Financial Resource Strain: Not on file  Food Insecurity: Not on file  Transportation Needs: Not on file  Physical Activity: Not on file  Stress: Not on file  Social Connections: Not on file   Allergies  Allergen Reactions   Morphine And Related Nausea Only   Family History  Problem Relation Age of Onset   Hypertension Mother    Dementia Mother        rapid decline; concerns for Creutzfeldt-Jakob disease   Lymphoma Father 66   Breast cancer Sister 46   Colon cancer Neg Hx      Current Outpatient Medications (Cardiovascular):    simvastatin (ZOCOR) 20 MG tablet, TAKE 1 TABLET(20  MG) BY MOUTH DAILY  Current Outpatient Medications (Respiratory):    promethazine (PHENERGAN) 25 MG suppository, Place 1 suppository (25 mg total) rectally every 6 (six) hours as needed for nausea. (Patient not taking: No sig reported)  Current Outpatient Medications (Analgesics):    acetaminophen (TYLENOL) 500 MG tablet, Take 1,000 mg by mouth every 6 (six) hours as needed.   oxyCODONE-acetaminophen (PERCOCET) 5-325 MG tablet, Take 1-2 tablets by mouth every 4 (four) hours as needed for severe pain.   Current Outpatient Medications (Other):    pregabalin (LYRICA) 50 MG capsule, Take 1 capsule (50 mg total) by mouth 2 (two) times daily.   b complex vitamins tablet, Take 1 tablet by mouth daily. (Patient not taking: Reported on 08/27/2021)   bisacodyl (DULCOLAX) 5 MG EC tablet, Take 1 tablet (5 mg total) by mouth daily as needed for  moderate constipation.   cholecalciferol (VITAMIN D) 1000 UNITS tablet, Take 1,000 Units by mouth daily. (Patient not taking: Reported on 08/27/2021)   cycloSPORINE (RESTASIS) 0.05 % ophthalmic emulsion, Place 1 drop into both eyes 2 (two) times daily.   docusate sodium (COLACE) 100 MG capsule, Take 1 capsule (100 mg total) by mouth 2 (two) times daily. (Patient not taking: No sig reported)   gabapentin (NEURONTIN) 600 MG tablet, Take 600-1,200 mg by mouth See admin instructions. Take 600 mg by mouth in the morning and 1200 mg at night   hydrocortisone (PROCTOZONE-HC) 2.5 % rectal cream, Place 1 application rectally 2 (two) times daily. (Patient not taking: Reported on 08/27/2021)   lamoTRIgine (LAMICTAL) 100 MG tablet, Take 100 mg by mouth 2 (two) times daily.    methocarbamol (ROBAXIN) 500 MG tablet, Take 1 tablet (500 mg total) by mouth every 6 (six) hours as needed for muscle spasms. (Patient not taking: No sig reported)   methocarbamol (ROBAXIN) 500 MG tablet, Take 1 tablet (500 mg total) by mouth 4 (four) times daily.   polyethylene glycol (MIRALAX) 17 g  packet, Take 17 g by mouth daily. (Patient not taking: No sig reported)   traZODone (DESYREL) 50 MG tablet, Take 50 mg by mouth at bedtime.   vitamin C (ASCORBIC ACID) 500 MG tablet, Take 500 mg by mouth daily. (Patient not taking: Reported on 08/27/2021)   zinc gluconate 50 MG tablet, Take 50 mg by mouth daily. (Patient not taking: Reported on 08/27/2021)   Reviewed prior external information including notes and imaging from  primary care provider As well as notes that were available from care everywhere and other healthcare systems.  Reviewed patient's intraoperative interbody fusion noted.  Reviewed patient's operative note as well from neurosurgeon which was in great detail.  Past medical history, social, surgical and family history all reviewed in electronic medical record.  No pertanent information unless stated regarding to the chief complaint.   Review of Systems:  No headache, visual changes, nausea, vomiting, diarrhea, constipation, dizziness, abdominal pain, skin rash, fevers, chills, night sweats, weight loss, swollen lymph nodes, body aches, joint swelling, chest pain, shortness of breath, mood changes. POSITIVE muscle aches  Objective  Blood pressure (!) 142/90, pulse 79, height 5\' 5"  (1.651 m), weight 114 lb (51.7 kg), SpO2 97 %.   General: No apparent distress alert and oriented x3 mood and affect normal, dressed appropriately.  HEENT: Pupils equal, extraocular movements intact  Respiratory: Patient's speak in full sentences and does not appear short of breath  Cardiovascular: No lower extremity edema, non tender, no erythema  Gait normal with good balance and coordination.  MSK: Patient back exam does have some loss of lordosis.  Some tenderness to palpation in the paraspinal musculature.  Patient has negative straight leg test.  Patient will does have more pain with attempted extension of the back noted today.  Procedure: Real-time Ultrasound Guided Injection of left CMC  joint Device: GE Logiq Q7 Ultrasound guided injection is preferred based studies that show increased duration, increased effect, greater accuracy, decreased procedural pain, increased response rate, and decreased cost with ultrasound guided versus blind injection.  Verbal informed consent obtained.  Time-out conducted.  Noted no overlying erythema, induration, or other signs of local infection.  Skin prepped in a sterile fashion.  Local anesthesia: Topical Ethyl chloride.  With sterile technique and under real time ultrasound guidance: With a 25-gauge half inch needle injecting 0.5 cc of 0.5% Marcaine and 0.5 cc of Kenalog 40  mg/mL Completed without difficulty  Pain immediately resolved suggesting accurate placement of the medication.  Advised to call if fevers/chills, erythema, induration, drainage, or persistent bleeding.  Impression: Technically successful ultrasound guided injection.    Impression and Recommendations:     The above documentation has been reviewed and is accurate and complete Bethany Pulley, DO

## 2021-10-28 ENCOUNTER — Encounter: Payer: Self-pay | Admitting: Family Medicine

## 2021-10-28 ENCOUNTER — Ambulatory Visit: Payer: Federal, State, Local not specified - PPO | Admitting: Family Medicine

## 2021-10-28 ENCOUNTER — Ambulatory Visit: Payer: Self-pay

## 2021-10-28 ENCOUNTER — Other Ambulatory Visit: Payer: Self-pay

## 2021-10-28 ENCOUNTER — Ambulatory Visit (INDEPENDENT_AMBULATORY_CARE_PROVIDER_SITE_OTHER): Payer: Federal, State, Local not specified - PPO

## 2021-10-28 VITALS — BP 142/90 | HR 79 | Ht 65.0 in | Wt 114.0 lb

## 2021-10-28 DIAGNOSIS — G8929 Other chronic pain: Secondary | ICD-10-CM

## 2021-10-28 DIAGNOSIS — M5136 Other intervertebral disc degeneration, lumbar region: Secondary | ICD-10-CM

## 2021-10-28 DIAGNOSIS — M48061 Spinal stenosis, lumbar region without neurogenic claudication: Secondary | ICD-10-CM | POA: Diagnosis not present

## 2021-10-28 DIAGNOSIS — M5441 Lumbago with sciatica, right side: Secondary | ICD-10-CM | POA: Diagnosis not present

## 2021-10-28 DIAGNOSIS — Z981 Arthrodesis status: Secondary | ICD-10-CM | POA: Diagnosis not present

## 2021-10-28 DIAGNOSIS — M1812 Unilateral primary osteoarthritis of first carpometacarpal joint, left hand: Secondary | ICD-10-CM

## 2021-10-28 DIAGNOSIS — M4326 Fusion of spine, lumbar region: Secondary | ICD-10-CM | POA: Diagnosis not present

## 2021-10-28 MED ORDER — PREGABALIN 50 MG PO CAPS: 50.0000 mg | ORAL_CAPSULE | Freq: Two times a day (BID) | ORAL | 0 refills | Status: DC

## 2021-10-28 NOTE — Patient Instructions (Signed)
Good to see you! Hold on Gabapentin and start Lyrica 50mg  2x a day If not improvement we may want to try Horizant Injected the thumb Write me after seeing surgeon See you again in 5-6 weeks

## 2021-10-28 NOTE — Assessment & Plan Note (Signed)
Patient given injection today and tolerated the procedure well, discussed icing regimen and home exercises, discussed which activities to do and which ones to avoid.  Discussed potentially bracing.  Follow-up again in 6 to 8 weeks

## 2021-10-28 NOTE — Assessment & Plan Note (Signed)
Patient does have severe arthritic changes of the back noted.  Patient did have surgical intervention.  X-rays we have is from intraoperative.  Appeared to be in good placement.  Patient initially states that the first 4 weeks was feeling better in the last 2 weeks started having increasing discomfort.  Patient would like x-rays and we will get them.  Patient states that the pain is severe enough and will give patient a very short course of Lyrica.  Warned of potential side effects and to discontinue her gabapentin.  Hopefully we will be able to go back to her gabapentin afterwards.  Patient does have muscle relaxers for breakthrough as well.  Patient is following up with the neuro surgeon tomorrow to discuss.  Do feel that patient would do very well with physical therapy.

## 2021-10-29 ENCOUNTER — Encounter: Payer: Self-pay | Admitting: Family Medicine

## 2021-10-29 DIAGNOSIS — M4316 Spondylolisthesis, lumbar region: Secondary | ICD-10-CM | POA: Diagnosis not present

## 2021-11-05 DIAGNOSIS — F1121 Opioid dependence, in remission: Secondary | ICD-10-CM | POA: Diagnosis not present

## 2021-11-05 DIAGNOSIS — F411 Generalized anxiety disorder: Secondary | ICD-10-CM | POA: Diagnosis not present

## 2021-11-05 DIAGNOSIS — F502 Bulimia nervosa: Secondary | ICD-10-CM | POA: Diagnosis not present

## 2021-11-19 ENCOUNTER — Other Ambulatory Visit: Payer: Self-pay | Admitting: Family Medicine

## 2021-11-19 DIAGNOSIS — L570 Actinic keratosis: Secondary | ICD-10-CM | POA: Diagnosis not present

## 2021-11-19 DIAGNOSIS — C44619 Basal cell carcinoma of skin of left upper limb, including shoulder: Secondary | ICD-10-CM | POA: Diagnosis not present

## 2021-11-19 DIAGNOSIS — C44519 Basal cell carcinoma of skin of other part of trunk: Secondary | ICD-10-CM | POA: Diagnosis not present

## 2021-11-21 DIAGNOSIS — M5416 Radiculopathy, lumbar region: Secondary | ICD-10-CM | POA: Diagnosis not present

## 2021-11-24 HISTORY — PX: SKIN BIOPSY: SHX1

## 2021-11-26 DIAGNOSIS — F502 Bulimia nervosa: Secondary | ICD-10-CM | POA: Diagnosis not present

## 2021-11-26 DIAGNOSIS — F1121 Opioid dependence, in remission: Secondary | ICD-10-CM | POA: Diagnosis not present

## 2021-11-26 DIAGNOSIS — F411 Generalized anxiety disorder: Secondary | ICD-10-CM | POA: Diagnosis not present

## 2021-12-02 ENCOUNTER — Ambulatory Visit: Payer: Federal, State, Local not specified - PPO | Admitting: Family Medicine

## 2021-12-11 ENCOUNTER — Other Ambulatory Visit: Payer: Self-pay

## 2021-12-11 ENCOUNTER — Ambulatory Visit (INDEPENDENT_AMBULATORY_CARE_PROVIDER_SITE_OTHER): Payer: Federal, State, Local not specified - PPO | Admitting: Family Medicine

## 2021-12-11 ENCOUNTER — Encounter: Payer: Self-pay | Admitting: Family Medicine

## 2021-12-11 VITALS — BP 148/86 | HR 68 | Temp 96.5°F | Wt 117.6 lb

## 2021-12-11 DIAGNOSIS — Z638 Other specified problems related to primary support group: Secondary | ICD-10-CM

## 2021-12-11 DIAGNOSIS — Z634 Disappearance and death of family member: Secondary | ICD-10-CM

## 2021-12-11 DIAGNOSIS — F430 Acute stress reaction: Secondary | ICD-10-CM | POA: Diagnosis not present

## 2021-12-11 DIAGNOSIS — Z6379 Other stressful life events affecting family and household: Secondary | ICD-10-CM

## 2021-12-11 NOTE — Patient Instructions (Signed)
Serenity prayer  Check with your psychiatrist on BuSpar Call hospice and get involved in bereavement counseling Do you have physical therapy.  This too like gas will pass

## 2021-12-11 NOTE — Progress Notes (Signed)
° °  Subjective:    Patient ID: Bethany [name], female    DOB: September 15, 1956, 66 y.o.   MRN: [id number]  HPI She is here for consultation.  She has been under a lot of stress recently.  Her stepfather recently died and now she is co Therapist, sports.  This has been very stressful on her.  She also had a relapse with pain meds secondary to having back surgery.  She is now over 60 days drug-free.  She continues to be followed by her psychiatrist.  Her sponsor is an addiction specialist.  She does go to [employer] regularly.   Review of Systems     Objective:   Physical Exam Alert and quite tearful.       Assessment & Plan:  Stress due to illness of family member  Stress due to family tension  Bereavement due to life event She realizes that she cannot take regular anxiety medications.  I recommend that she talk to her psychiatrist about the possibility of using BuSpar.  Her back is still giving her trouble and I strongly encouraged her to stick with physical therapy and they can help with her pain as well as psychologically.  Also recommend that she call hospice to get involved in bereavement counseling.  25 minutes spent discussing these issues with her.

## 2021-12-16 ENCOUNTER — Encounter: Payer: Self-pay | Admitting: Family Medicine

## 2021-12-16 MED ORDER — IBUPROFEN 800 MG PO TABS: 800.0000 mg | ORAL_TABLET | Freq: Three times a day (TID) | ORAL | 0 refills | Status: DC | PRN

## 2021-12-22 DIAGNOSIS — M5441 Lumbago with sciatica, right side: Secondary | ICD-10-CM | POA: Diagnosis not present

## 2021-12-22 DIAGNOSIS — M4316 Spondylolisthesis, lumbar region: Secondary | ICD-10-CM | POA: Diagnosis not present

## 2021-12-25 DIAGNOSIS — F411 Generalized anxiety disorder: Secondary | ICD-10-CM | POA: Diagnosis not present

## 2021-12-25 DIAGNOSIS — F1121 Opioid dependence, in remission: Secondary | ICD-10-CM | POA: Diagnosis not present

## 2021-12-25 DIAGNOSIS — F502 Bulimia nervosa: Secondary | ICD-10-CM | POA: Diagnosis not present

## 2021-12-25 IMAGING — RF DG LUMBAR SPINE 2-3V
1 series · 2 of 2 positions shown · non-contrast
Comparison: MRI lumbar spine 10/30/2020

CLINICAL DATA: Back surgery. L4-5 transforaminal lumbar interbody
fusion.

EXAM:
LUMBAR SPINE - 2-3 VIEW

[Series 1: run · 2 of 2 slices shown]
[im 1/2]
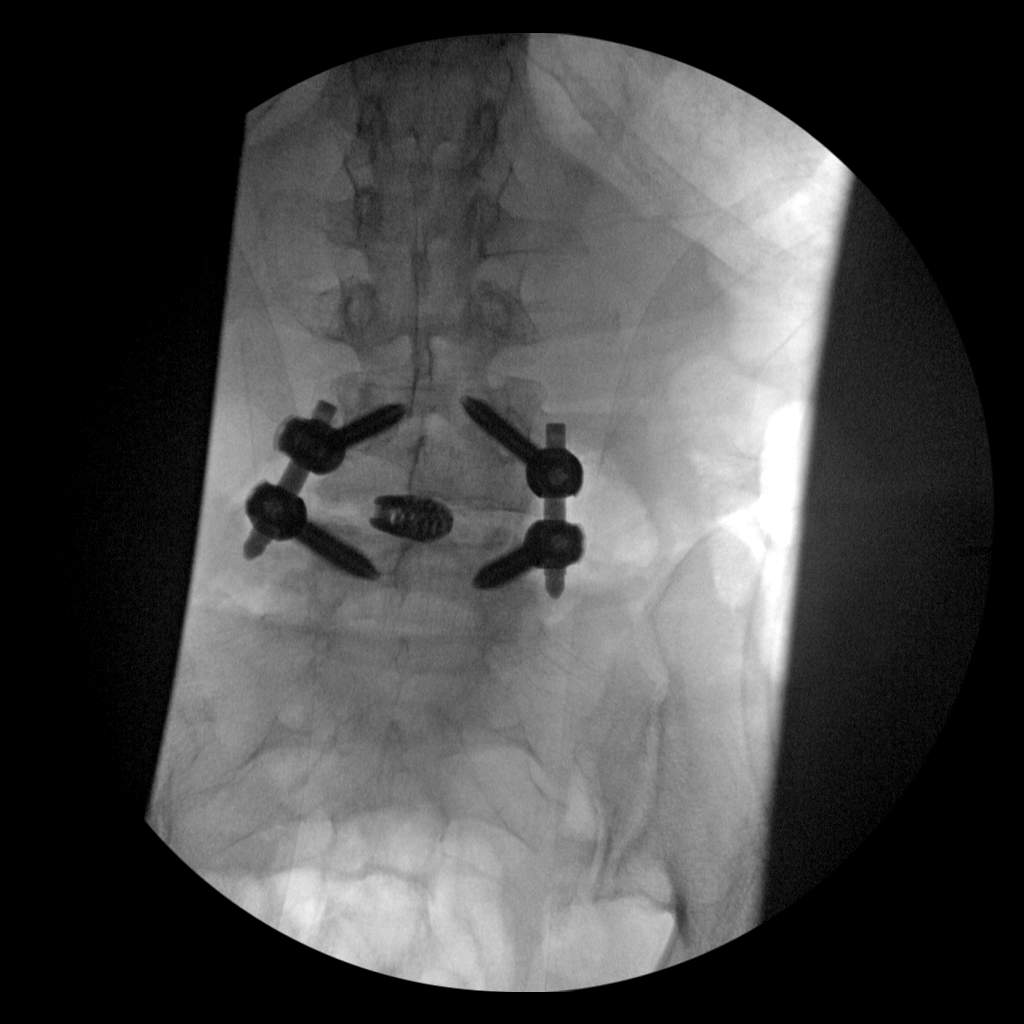
[im 2/2]
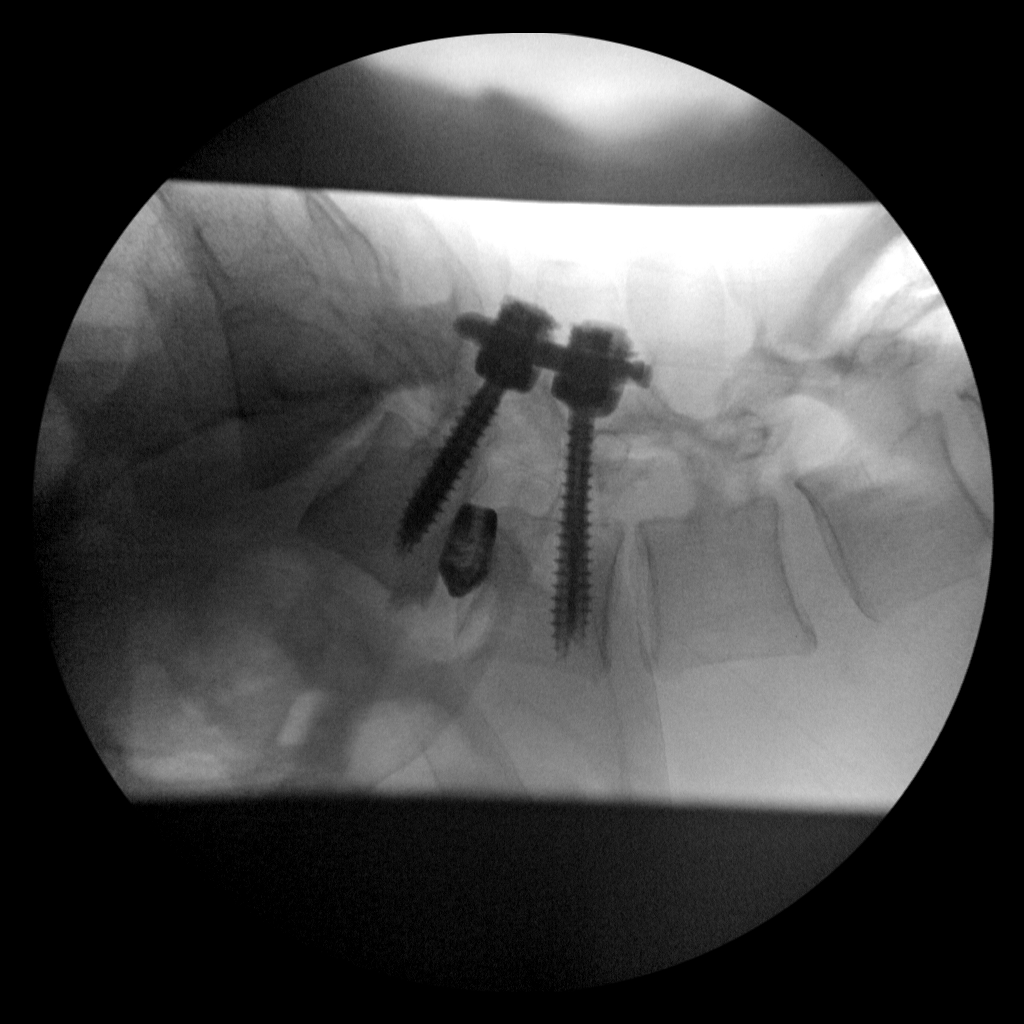

[2 of 2 positions shown; findings below may reference images not displayed]

FINDINGS: Grade 2 anterolisthesis L4-5 is stable on flexion extension views.
Based on the prior MRI this appears to be due to advanced facet
degeneration.

Remaining alignment normal.  Negative for fracture.
IMPRESSION: Grade 2 anterolisthesis L4-5. No instability on flexion or
extension.

## 2022-01-16 ENCOUNTER — Other Ambulatory Visit: Payer: Self-pay | Admitting: Family Medicine

## 2022-01-19 DIAGNOSIS — C44619 Basal cell carcinoma of skin of left upper limb, including shoulder: Secondary | ICD-10-CM | POA: Diagnosis not present

## 2022-01-19 DIAGNOSIS — C44519 Basal cell carcinoma of skin of other part of trunk: Secondary | ICD-10-CM | POA: Diagnosis not present

## 2022-01-21 DIAGNOSIS — F1121 Opioid dependence, in remission: Secondary | ICD-10-CM | POA: Diagnosis not present

## 2022-01-21 DIAGNOSIS — F411 Generalized anxiety disorder: Secondary | ICD-10-CM | POA: Diagnosis not present

## 2022-01-21 DIAGNOSIS — F502 Bulimia nervosa: Secondary | ICD-10-CM | POA: Diagnosis not present

## 2022-02-04 DIAGNOSIS — M4316 Spondylolisthesis, lumbar region: Secondary | ICD-10-CM | POA: Diagnosis not present

## 2022-02-17 ENCOUNTER — Other Ambulatory Visit: Payer: Self-pay | Admitting: Family Medicine

## 2022-02-25 DIAGNOSIS — F502 Bulimia nervosa: Secondary | ICD-10-CM | POA: Diagnosis not present

## 2022-02-25 DIAGNOSIS — F1121 Opioid dependence, in remission: Secondary | ICD-10-CM | POA: Diagnosis not present

## 2022-02-25 DIAGNOSIS — F411 Generalized anxiety disorder: Secondary | ICD-10-CM | POA: Diagnosis not present

## 2022-03-01 ENCOUNTER — Other Ambulatory Visit: Payer: Self-pay | Admitting: Family Medicine

## 2022-03-02 DIAGNOSIS — L570 Actinic keratosis: Secondary | ICD-10-CM | POA: Diagnosis not present

## 2022-03-02 DIAGNOSIS — L821 Other seborrheic keratosis: Secondary | ICD-10-CM | POA: Diagnosis not present

## 2022-03-02 DIAGNOSIS — L814 Other melanin hyperpigmentation: Secondary | ICD-10-CM | POA: Diagnosis not present

## 2022-03-02 DIAGNOSIS — Z85828 Personal history of other malignant neoplasm of skin: Secondary | ICD-10-CM | POA: Diagnosis not present

## 2022-03-02 NOTE — Telephone Encounter (Signed)
[facility] is requesting to fill pt ibuprofen. Please advise KH 

## 2022-03-03 ENCOUNTER — Encounter: Payer: Self-pay | Admitting: Family Medicine

## 2022-03-03 ENCOUNTER — Ambulatory Visit (INDEPENDENT_AMBULATORY_CARE_PROVIDER_SITE_OTHER): Payer: Federal, State, Local not specified - PPO | Admitting: Family Medicine

## 2022-03-03 VITALS — BP 110/70 | HR 72 | Temp 96.8°F | Wt 116.8 lb

## 2022-03-03 DIAGNOSIS — N6322 Unspecified lump in the left breast, upper inner quadrant: Secondary | ICD-10-CM

## 2022-03-03 NOTE — Progress Notes (Signed)
? ?  Subjective:  ? ? Patient ID: Bethany [name], female    DOB: December 24, 1955, 66 y.o.   MRN: [id number] ? ?HPI ?In the last several days she has noted a lump at the 3 o'clock position in the left breast.  She does have implants and has had a mammogram in December which was normal. ? ? ?Review of Systems ? ?   ?Objective:  ? Physical Exam ?Exam of the left breast does show a 1 cm lesion present at the 3 o'clock position ? ? ? ?   ?Assessment & Plan:  ?Mass of upper inner quadrant of left breast - Plan: MM Digital Diagnostic Unilat L ? ? ?

## 2022-03-10 DIAGNOSIS — N644 Mastodynia: Secondary | ICD-10-CM | POA: Diagnosis not present

## 2022-03-10 LAB — HM MAMMOGRAPHY

## 2022-03-12 ENCOUNTER — Encounter: Payer: Self-pay | Admitting: Family Medicine

## 2022-03-23 DIAGNOSIS — H2511 Age-related nuclear cataract, right eye: Secondary | ICD-10-CM | POA: Diagnosis not present

## 2022-03-23 DIAGNOSIS — H16223 Keratoconjunctivitis sicca, not specified as Sjogren's, bilateral: Secondary | ICD-10-CM | POA: Diagnosis not present

## 2022-03-23 DIAGNOSIS — H2513 Age-related nuclear cataract, bilateral: Secondary | ICD-10-CM | POA: Diagnosis not present

## 2022-03-23 DIAGNOSIS — H43392 Other vitreous opacities, left eye: Secondary | ICD-10-CM | POA: Diagnosis not present

## 2022-03-23 DIAGNOSIS — H43813 Vitreous degeneration, bilateral: Secondary | ICD-10-CM | POA: Diagnosis not present

## 2022-03-26 DIAGNOSIS — F502 Bulimia nervosa: Secondary | ICD-10-CM | POA: Diagnosis not present

## 2022-03-26 DIAGNOSIS — F411 Generalized anxiety disorder: Secondary | ICD-10-CM | POA: Diagnosis not present

## 2022-03-26 DIAGNOSIS — F1121 Opioid dependence, in remission: Secondary | ICD-10-CM | POA: Diagnosis not present

## 2022-04-03 DIAGNOSIS — H43812 Vitreous degeneration, left eye: Secondary | ICD-10-CM | POA: Diagnosis not present

## 2022-04-22 DIAGNOSIS — F502 Bulimia nervosa: Secondary | ICD-10-CM | POA: Diagnosis not present

## 2022-04-22 DIAGNOSIS — F1121 Opioid dependence, in remission: Secondary | ICD-10-CM | POA: Diagnosis not present

## 2022-04-22 DIAGNOSIS — F411 Generalized anxiety disorder: Secondary | ICD-10-CM | POA: Diagnosis not present

## 2022-04-23 ENCOUNTER — Encounter: Payer: Self-pay | Admitting: Family Medicine

## 2022-04-23 ENCOUNTER — Ambulatory Visit (INDEPENDENT_AMBULATORY_CARE_PROVIDER_SITE_OTHER): Payer: Federal, State, Local not specified - PPO | Admitting: Family Medicine

## 2022-04-23 VITALS — BP 130/66 | HR 56 | Temp 96.8°F | Ht 64.5 in | Wt 115.2 lb

## 2022-04-23 DIAGNOSIS — F332 Major depressive disorder, recurrent severe without psychotic features: Secondary | ICD-10-CM

## 2022-04-23 DIAGNOSIS — E785 Hyperlipidemia, unspecified: Secondary | ICD-10-CM | POA: Diagnosis not present

## 2022-04-23 DIAGNOSIS — F900 Attention-deficit hyperactivity disorder, predominantly inattentive type: Secondary | ICD-10-CM

## 2022-04-23 DIAGNOSIS — K589 Irritable bowel syndrome without diarrhea: Secondary | ICD-10-CM

## 2022-04-23 DIAGNOSIS — M5136 Other intervertebral disc degeneration, lumbar region: Secondary | ICD-10-CM

## 2022-04-23 DIAGNOSIS — Z8719 Personal history of other diseases of the digestive system: Secondary | ICD-10-CM | POA: Diagnosis not present

## 2022-04-23 DIAGNOSIS — Z23 Encounter for immunization: Secondary | ICD-10-CM

## 2022-04-23 DIAGNOSIS — D126 Benign neoplasm of colon, unspecified: Secondary | ICD-10-CM

## 2022-04-23 DIAGNOSIS — I7300 Raynaud's syndrome without gangrene: Secondary | ICD-10-CM | POA: Diagnosis not present

## 2022-04-23 DIAGNOSIS — Z90710 Acquired absence of both cervix and uterus: Secondary | ICD-10-CM

## 2022-04-23 DIAGNOSIS — Z96651 Presence of right artificial knee joint: Secondary | ICD-10-CM

## 2022-04-23 DIAGNOSIS — F1021 Alcohol dependence, in remission: Secondary | ICD-10-CM

## 2022-04-23 DIAGNOSIS — F1110 Opioid abuse, uncomplicated: Secondary | ICD-10-CM

## 2022-04-23 DIAGNOSIS — Z0000 Encounter for general adult medical examination without abnormal findings: Secondary | ICD-10-CM | POA: Diagnosis not present

## 2022-04-23 MED ORDER — SIMVASTATIN 20 MG PO TABS: ORAL_TABLET | ORAL | 3 refills | Status: DC

## 2022-04-23 NOTE — Progress Notes (Signed)
Complete physical exam  Patient: Bethany [name]   DOB: 07-Dec-1955   66 y.o. Female  MRN: [id number]  Subjective:    Chief Complaint  Patient presents with   Annual Exam    Non fasting     Bethany [name] is a 66 y.o. female who presents today for a complete physical exam. She reports consuming a general diet. Gym/ health club routine includes cardio, light weights, and walking on track . She generally feels well. She reports sleeping well. She is followed by her psychiatrist, Dr. Nori Riis.  She has remained off of alcohol as well as opioids for quite some time now.  She does have underlying ADHD but is reluctant to be on any medications as in the past she has had difficulty with stimulants being abused.  She is now retired and apparently her husband will soon to be retired.  There are concerns about how their interaction will be changing because of this. She continues on simvastatin for her hyperlipidemia.  She does have a history of Raynaud's disease but has not had any difficulty with that.  She also has a history of tubular adenoma and will be scheduled for follow-up colonoscopy.  There is also history of irritable bowel syndrome as well as small bowel obstruction.  She has one of her knees replaced and is still not back to full flexion of the knee.  She has had physical therapy as well as manipulation down on it without much success.  Family and social history as well as health maintenance and immunizations was reviewed.  Most recent fall risk assessment:    04/23/2022    1:49 PM  Fall Risk   Falls in the past year? 1  Number falls in past yr: 1  Injury with Fall? 0  Risk for fall due to : Impaired balance/gait  Follow up Falls evaluation completed     Most recent depression screenings:    04/23/2022    1:53 PM 12/11/2021    2:52 PM  PHQ 2/9 Scores  PHQ - 2 Score 0 2  PHQ- 9 Score 0 10      Patient Active Problem List   Diagnosis Date Noted   History of hysterectomy  04/23/2022   Squamous cell carcinoma in situ (SCCIS) of skin of left thigh 08/27/2021   Generalized anxiety disorder    History of opiate abuse, in remission    Spondylolisthesis of lumbar region 02/10/2021   Lumbago with sciatica, right side 02/10/2021   Recovering alcoholic in remission     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 08/02/2020   Meibomian gland dysfunction (MGD) of both eyes 08/02/2020   S/P LASIK (laser assisted in situ keratomileusis) 08/02/2020   Degenerative disc disease, lumbar 05/02/2020   History of calcium pyrophosphate deposition disease (CPPD) 03/26/2020   Arthritis of carpometacarpal (CMC) joint of left thumb 01/18/2020   Status post total right knee replacement 10/31/2019   ADHD (attention deficit hyperactivity disorder) 09/13/2018   IBS (irritable bowel syndrome) 04/17/2018   History of small bowel obstruction 04/17/2018   Nonallopathic lesion of thoracic region 08/05/2017   Nonallopathic lesion of lumbosacral region 08/05/2017   Nonallopathic lesion of sacral region 08/05/2017   Tubular adenoma of colon 04/21/2016   Major depressive disorder    Hyperlipidemia with target LDL less than 100 06/17/2011   Raynaud's disease 06/17/2011   Past Medical History:  Diagnosis Date   ADHD (attention deficit hyperactivity disorder) 09/13/2018   Anterior basement membrane  dystrophy (ABMD) of both eyes 08/02/2020   Arthritis of carpometacarpal [facility]) joint of left thumb 01/18/2020   Injected January 18, 2020   Cervical dysplasia 1990   CIN 2 subsequent cryosurgery. All Paps normal afterwards   Degenerative disc disease, lumbar 05/02/2020   Dyslipidemia    Generalized anxiety disorder    Herpes genitalis    Herpes labialis    History of calcium pyrophosphate deposition disease (CPPD) 03/26/2020   History of opiate abuse, in remission    History of small bowel obstruction 04/17/2018   partial   Hyperlipidemia with target LDL less than 100 06/17/2011    IBS (irritable bowel syndrome)    Lumbago with sciatica, right side 02/10/2021   Major depressive disorder    Meibomian gland dysfunction (MGD) of both eyes 08/02/2020   Nonallopathic lesion of lumbosacral region 08/05/2017   Nonallopathic lesion of sacral region 08/05/2017   Nonallopathic lesion of thoracic region 08/05/2017   Osteopenia 04/2014   T score -1.7 FRAX 7.3%/0.8% stable from prior DEXA 2013   Raynaud's disease 06/17/2011   Recovering alcoholic in remission     S/P LASIK (laser assisted in situ keratomileusis) 08/02/2020   right eye   Spondylolisthesis of lumbar region 02/10/2021   Status post total right knee replacement 10/31/2019   Tubular adenoma of colon 04/21/2016   Past Surgical History:  Procedure Laterality Date   AUGMENTATION MAMMAPLASTY     BREAST ENHANCEMENT SURGERY  2006   COSMETIC SURGERY     EYE SURGERY     lasik od   GYNECOLOGIC CRYOSURGERY  1990   LAPAROTOMY N/A 04/22/2018   Procedure: EXPLORATORY LAPAROTOMY;  Surgeon: Erroll Luna, MD;  Location: [place];  Service: General;  Laterality: N/A;   LIGAMENT REPAIR Right 09/24/2015   Procedure: RIGHT INDEX METACARPAL PHALANGEAL RADIAL COLLATERAL LIGAMENT REPAIR;  Surgeon: Leanora Cover, MD;  Location: [place];  Service: Orthopedics;  Laterality: Right;   MASS EXCISION N/A 08/04/2018   Procedure: REMOVAL OF SUTURE FROM ABDOMEN/ERAS PATHWAY;  Surgeon: Erroll Luna, MD;  Location: [place];  Service: General;  Laterality: N/A;   OOPHORECTOMY     BSO   Right knee arthoscopy Right 09/09/2017   [facility]   SHX 1 Left 02/19/2020   basel cell carcinoma   SHX1 Right 12/11/2020   basal cell carinoma,micronodular pattern   SKIN BIOPSY Right 02/28/2019   Superficial basal carcinoma    SKIN BIOPSY Left 11/24/2021   superficial basal carcinoma of the left forearm   TONSILLECTOMY AND ADENOIDECTOMY     TOTAL KNEE ARTHROPLASTY Right 04/12/2018   TOTAL KNEE ARTHROPLASTY  Right 04/12/2018   Procedure: RIGHT TOTAL KNEE ARTHROPLASTY;  Surgeon: Melrose Nakayama, MD;  Location: [place];  Service: Orthopedics;  Laterality: Right;   TRANSFORAMINAL LUMBAR INTERBODY FUSION W/ MIS 1 LEVEL N/A 09/03/2021   Procedure: Lumbar four-five Minimally invasive transforaminal lumbar interbody fusion;  Surgeon: Dawley, Theodoro Doing, DO;  Location: [place];  Service: Neurosurgery;  Laterality: N/A;   TUBAL LIGATION     UPPER GASTROINTESTINAL ENDOSCOPY  04/24/2005   VAGINAL HYSTERECTOMY  2005   [facility] BSO  adenomyosis   Social History   Tobacco Use   Smoking status: Former    Types: Cigarettes    Quit date: 03/21/2006    Years since quitting: 16.1   Smokeless tobacco: Never  Vaping Use   Vaping Use: Never used  Substance Use Topics   Alcohol use: Not Currently    Comment: history of alcohol  abuse/dependence. Sober for past 18 years   Drug use: Not Currently    Types: Oxycodone    Comment: history of opiate abuse. Sober for past 8 months   Family History  Problem Relation Age of Onset   Hypertension Mother    Dementia Mother        rapid decline; concerns for Creutzfeldt-Jakob disease   Lymphoma Father 63   Breast cancer Sister 35   Colon cancer Neg Hx    Allergies  Allergen Reactions   Morphine And Related Nausea Only      Patient Care Team: Ronnald Nian, MD as PCP - General (Family Medicine)   Outpatient Medications Prior to Visit  Medication Sig Note   acetaminophen (TYLENOL) 500 MG tablet Take 1,000 mg by mouth every 6 (six) hours as needed. 04/23/2022: PRN last dose two weeks ago   b complex vitamins tablet Take 1 tablet by mouth daily.    bisacodyl (DULCOLAX) 5 MG EC tablet Take 1 tablet (5 mg total) by mouth daily as needed for moderate constipation. 04/23/2022: Prn last dose last night   cholecalciferol (VITAMIN D) 1000 UNITS tablet Take 1,000 Units by mouth daily.    cycloSPORINE (RESTASIS) 0.05 % ophthalmic emulsion Place 1 drop into both eyes 2 (two) times  daily.    gabapentin (NEURONTIN) 600 MG tablet Take 600-1,200 mg by mouth See admin instructions. Take 600 mg by mouth in the morning and 1200 mg at night    hydrocortisone (PROCTOZONE-HC) 2.5 % rectal cream Place 1 application rectally 2 (two) times daily. 04/23/2022: Prn last dose two months ago   ibuprofen (ADVIL) 800 MG tablet TAKE 1 TABLET(800 MG) BY MOUTH EVERY 8 HOURS AS NEEDED    lamoTRIgine (LAMICTAL) 100 MG tablet Take 100 mg by mouth 2 (two) times daily.     Omega-3 Fatty Acids (FISH OIL PO) Take by mouth.    simvastatin (ZOCOR) 20 MG tablet TAKE 1 TABLET(20 MG) BY MOUTH DAILY    traZODone (DESYREL) 50 MG tablet Take 50 mg by mouth at bedtime.    vitamin C (ASCORBIC ACID) 500 MG tablet Take 500 mg by mouth daily.    zinc gluconate 50 MG tablet Take 50 mg by mouth daily.    Baclofen 5 MG TABS Take by mouth. (Patient not taking: Reported on 03/03/2022)    docusate sodium (COLACE) 100 MG capsule Take 1 capsule (100 mg total) by mouth 2 (two) times daily. (Patient not taking: Reported on 08/21/2021) 04/23/2022: Last dose a year ago   hydrocortisone 2.5 % cream hydrocortisone 2.5 % topical cream with perineal applicator  APPLY RECTALLY TO THE AFFECTED AREA TWICE DAILY    lidocaine (LIDODERM) 5 % 1 patch daily. (Patient not taking: Reported on 04/23/2022) 04/23/2022: Last dose two months ago   methocarbamol (ROBAXIN) 500 MG tablet Take 1 tablet (500 mg total) by mouth every 6 (six) hours as needed for muscle spasms. (Patient not taking: Reported on 08/21/2021) 04/23/2022: Last dose four months ago   methocarbamol (ROBAXIN) 500 MG tablet Take 1 tablet (500 mg total) by mouth 4 (four) times daily. (Patient not taking: Reported on 12/11/2021)    oxyCODONE-acetaminophen (PERCOCET) 5-325 MG tablet Take 1-2 tablets by mouth every 4 (four) hours as needed for severe pain. (Patient not taking: Reported on 12/11/2021) 04/23/2022: Last dose nov of 2022   polyethylene glycol (MIRALAX) 17 g packet Take 17 g by mouth  daily. (Patient not taking: Reported on 03/03/2022) 04/23/2022: Last dose over one year  pregabalin (LYRICA) 50 MG capsule Take 1 capsule (50 mg total) by mouth 2 (two) times daily. (Patient not taking: Reported on 12/11/2021) 04/23/2022: Last dose dec of 2022   promethazine (PHENERGAN) 25 MG suppository Place 1 suppository (25 mg total) rectally every 6 (six) hours as needed for nausea. (Patient not taking: Reported on 08/21/2021) 04/23/2022: Prn last dose unknown   No facility-administered medications prior to visit.    Review of Systems  All other systems reviewed and are negative.         Objective:     BP 130/66   Pulse (!) 56   Temp (!) 96.8 F (36 C)   Ht 5' 4.5" (1.638 m)   Wt 115 lb 3.2 oz (52.3 kg)   SpO2 97%   BMI 19.47 kg/m  BP Readings from Last 3 Encounters:  04/23/22 130/66  03/03/22 110/70  12/11/21 (!) 148/86   Wt Readings from Last 3 Encounters:  04/23/22 115 lb 3.2 oz (52.3 kg)  03/03/22 116 lb 12.8 oz (53 kg)  12/11/21 117 lb 9.6 oz (53.3 kg)      Physical Exam  Alert and in no distress. Tympanic membranes and canals are normal. Pharyngeal area is normal. Neck is supple without adenopathy or thyromegaly. Cardiac exam shows a regular sinus rhythm without murmurs or gallops. Lungs are clear to auscultation.  Last CBC Lab Results  Component Value Date   WBC 8.5 08/26/2021   HGB 12.8 08/26/2021   HCT 38.8 08/26/2021   MCV 102.6 (H) 08/26/2021   MCH 33.9 08/26/2021   RDW 12.0 08/26/2021   PLT 201 08/26/2021   Last metabolic panel Lab Results  Component Value Date   GLUCOSE 112 (H) 06/24/2021   NA 140 06/24/2021   K 3.5 06/24/2021   CL 101 06/24/2021   CO2 32 06/24/2021   BUN 23 06/24/2021   CREATININE 0.94 06/24/2021   EGFR 55 (L) 03/10/2021   CALCIUM 9.9 06/24/2021   PHOS 4.9 (H) 04/28/2018   PROT 7.2 06/24/2021   ALBUMIN 4.5 06/24/2021   LABGLOB 2.1 03/10/2021   AGRATIO 2.4 (H) 03/10/2021   BILITOT 0.4 06/24/2021   ALKPHOS 73  06/24/2021   AST 34 06/24/2021   ALT 23 06/24/2021   ANIONGAP 12 10/03/2020   Last lipids Lab Results  Component Value Date   CHOL 225 (H) 10/31/2019   HDL 121 10/31/2019   LDLCALC 96 10/31/2019   TRIG 47 10/31/2019   CHOLHDL 1.9 10/31/2019   Last vitamin D Lab Results  Component Value Date   VD25OH 107.37 (HH) 11/28/2020        Assessment & Plan:    Routine Health Maintenance and Physical Exam  Immunization History  Administered Date(s) Administered   Influenza Split 08/20/2015   Influenza Whole 07/02/2010, 07/25/2011   Influenza,inj,Quad PF,6+ Mos 09/07/2013, 09/10/2014, 08/20/2016, 08/04/2017, 07/28/2018, 08/16/2019, 08/28/2020, 07/21/2021   PFIZER(Purple Top)SARS-COV-2 Vaccination 01/25/2020, 02/15/2020, 08/28/2020, 08/28/2020   Pfizer Covid-19 Vaccine Bivalent Booster 74yrs & up 08/25/2021   Pneumococcal Conjugate-13 12/10/2020   Pneumococcal Polysaccharide-23 04/23/2022   Tdap 04/27/2008, 09/13/2018   Zoster Recombinat (Shingrix) 08/04/2017, 09/13/2018   Zoster, Live 01/07/2016    Health Maintenance  Topic Date Due   COLONOSCOPY (Pts 45-83yrs Insurance coverage will need to be confirmed)  04/10/2021   INFLUENZA VACCINE  06/02/2022   MAMMOGRAM  03/10/2024   TETANUS/TDAP  09/13/2028   Pneumonia Vaccine 20+ Years old  Completed   DEXA SCAN  Completed   COVID-19 Vaccine  Completed  Hepatitis C Screening  Completed   Zoster Vaccines- Shingrix  Completed   HPV VACCINES  Aged Out    Discussed health benefits of physical activity, and encouraged her to engage in regular exercise appropriate for her age and condition.  Problem List Items Addressed This Visit     ADHD (attention deficit hyperactivity disorder)   Degenerative disc disease, lumbar   History of hysterectomy   History of opiate abuse, in remission   History of small bowel obstruction   Relevant Orders   Comprehensive metabolic panel   CBC with Differential/Platelet   Hyperlipidemia with  target LDL less than 100   Relevant Orders   Lipid panel   IBS (irritable bowel syndrome)   Relevant Orders   Comprehensive metabolic panel   CBC with Differential/Platelet   Major depressive disorder   Raynaud's disease   Recovering alcoholic in remission    Status post total right knee replacement   Tubular adenoma of colon   Relevant Orders   Ambulatory referral to Gastroenterology   Other Visit Diagnoses     Routine general medical examination at a health care facility    -  Primary   Relevant Orders   Comprehensive metabolic panel   CBC with Differential/Platelet   Lipid panel   Need for vaccination against Streptococcus pneumoniae       Relevant Orders   Pneumococcal polysaccharide vaccine 23-valent greater than or equal to 2yo subcutaneous/IM (Completed)      Return in about 1 year (around 04/24/2023) for cpe . The fact that the dynamics between she and her husband might change when he retires and encouraged open honest communication.    Jill Alexanders, MD

## 2022-04-24 LAB — CBC WITH DIFFERENTIAL/PLATELET
Basophils Absolute: 0.1 10*3/uL (ref 0.0–0.2)
Basos: 1 %
EOS (ABSOLUTE): 0.1 10*3/uL (ref 0.0–0.4)
Eos: 1 %
Hematocrit: 37.2 % (ref 34.0–46.6)
Hemoglobin: 13 g/dL (ref 11.1–15.9)
Immature Grans (Abs): 0 10*3/uL (ref 0.0–0.1)
Immature Granulocytes: 0 %
Lymphocytes Absolute: 1.6 10*3/uL (ref 0.7–3.1)
Lymphs: 29 %
MCH: 33.6 pg — ABNORMAL HIGH (ref 26.6–33.0)
MCHC: 34.9 g/dL (ref 31.5–35.7)
MCV: 96 fL (ref 79–97)
Monocytes Absolute: 0.4 10*3/uL (ref 0.1–0.9)
Monocytes: 7 %
Neutrophils Absolute: 3.5 10*3/uL (ref 1.4–7.0)
Neutrophils: 62 %
Platelets: 212 10*3/uL (ref 150–450)
RBC: 3.87 x10E6/uL (ref 3.77–5.28)
RDW: 11.8 % (ref 11.7–15.4)
WBC: 5.6 10*3/uL (ref 3.4–10.8)

## 2022-04-24 LAB — COMPREHENSIVE METABOLIC PANEL
ALT: 30 IU/L (ref 0–32)
AST: 36 IU/L (ref 0–40)
Albumin/Globulin Ratio: 2.1 (ref 1.2–2.2)
Albumin: 4.7 g/dL (ref 3.8–4.8)
Alkaline Phosphatase: 98 IU/L (ref 44–121)
BUN/Creatinine Ratio: 15 (ref 12–28)
BUN: 13 mg/dL (ref 8–27)
Bilirubin Total: 0.3 mg/dL (ref 0.0–1.2)
CO2: 25 mmol/L (ref 20–29)
Calcium: 9.4 mg/dL (ref 8.7–10.3)
Chloride: 102 mmol/L (ref 96–106)
Creatinine, Ser: 0.86 mg/dL (ref 0.57–1.00)
Globulin, Total: 2.2 g/dL (ref 1.5–4.5)
Glucose: 69 mg/dL — ABNORMAL LOW (ref 70–99)
Potassium: 4.1 mmol/L (ref 3.5–5.2)
Sodium: 143 mmol/L (ref 134–144)
Total Protein: 6.9 g/dL (ref 6.0–8.5)
eGFR: 74 mL/min/{1.73_m2} (ref >59)

## 2022-04-24 LAB — LIPID PANEL
Chol/HDL Ratio: 2.2 ratio (ref 0.0–4.4)
Cholesterol, Total: 228 mg/dL — ABNORMAL HIGH (ref 100–199)
HDL: 103 mg/dL (ref >39)
LDL Chol Calc (NIH): 113 mg/dL — ABNORMAL HIGH (ref 0–99)
Triglycerides: 72 mg/dL (ref 0–149)
VLDL Cholesterol Cal: 12 mg/dL (ref 5–40)

## 2022-04-28 ENCOUNTER — Encounter: Payer: Self-pay | Admitting: Family Medicine

## 2022-05-07 ENCOUNTER — Encounter: Payer: Self-pay | Admitting: Family Medicine

## 2022-05-14 ENCOUNTER — Encounter: Payer: Self-pay | Admitting: Family Medicine

## 2022-05-15 ENCOUNTER — Ambulatory Visit: Payer: Federal, State, Local not specified - PPO | Admitting: Family Medicine

## 2022-05-19 DIAGNOSIS — L570 Actinic keratosis: Secondary | ICD-10-CM | POA: Diagnosis not present

## 2022-05-19 DIAGNOSIS — K130 Diseases of lips: Secondary | ICD-10-CM | POA: Diagnosis not present

## 2022-05-19 DIAGNOSIS — Z85828 Personal history of other malignant neoplasm of skin: Secondary | ICD-10-CM | POA: Diagnosis not present

## 2022-05-19 DIAGNOSIS — L821 Other seborrheic keratosis: Secondary | ICD-10-CM | POA: Diagnosis not present

## 2022-05-27 DIAGNOSIS — F411 Generalized anxiety disorder: Secondary | ICD-10-CM | POA: Diagnosis not present

## 2022-05-27 DIAGNOSIS — F502 Bulimia nervosa: Secondary | ICD-10-CM | POA: Diagnosis not present

## 2022-05-27 DIAGNOSIS — F1121 Opioid dependence, in remission: Secondary | ICD-10-CM | POA: Diagnosis not present

## 2022-06-16 ENCOUNTER — Inpatient Hospital Stay (HOSPITAL_COMMUNITY): Payer: Federal, State, Local not specified - PPO

## 2022-06-16 ENCOUNTER — Emergency Department (HOSPITAL_BASED_OUTPATIENT_CLINIC_OR_DEPARTMENT_OTHER): Payer: Federal, State, Local not specified - PPO

## 2022-06-16 ENCOUNTER — Inpatient Hospital Stay (HOSPITAL_BASED_OUTPATIENT_CLINIC_OR_DEPARTMENT_OTHER)
Admission: EM | Admit: 2022-06-16 | Discharge: 2022-06-19 | DRG: 390 | Disposition: A | Discharge status: Home / Self Care | Payer: Federal, State, Local not specified - PPO | Attending: Surgery | Admitting: Surgery

## 2022-06-16 ENCOUNTER — Other Ambulatory Visit: Payer: Self-pay

## 2022-06-16 ENCOUNTER — Encounter (HOSPITAL_BASED_OUTPATIENT_CLINIC_OR_DEPARTMENT_OTHER): Payer: Self-pay | Admitting: Emergency Medicine

## 2022-06-16 DIAGNOSIS — Z885 Allergy status to narcotic agent status: Secondary | ICD-10-CM | POA: Diagnosis not present

## 2022-06-16 DIAGNOSIS — F909 Attention-deficit hyperactivity disorder, unspecified type: Secondary | ICD-10-CM | POA: Diagnosis present

## 2022-06-16 DIAGNOSIS — Z90722 Acquired absence of ovaries, bilateral: Secondary | ICD-10-CM | POA: Diagnosis not present

## 2022-06-16 DIAGNOSIS — E785 Hyperlipidemia, unspecified: Secondary | ICD-10-CM | POA: Diagnosis present

## 2022-06-16 DIAGNOSIS — K565 Intestinal adhesions [bands], unspecified as to partial versus complete obstruction: Secondary | ICD-10-CM | POA: Diagnosis not present

## 2022-06-16 DIAGNOSIS — Z85828 Personal history of other malignant neoplasm of skin: Secondary | ICD-10-CM

## 2022-06-16 DIAGNOSIS — Z79891 Long term (current) use of opiate analgesic: Secondary | ICD-10-CM

## 2022-06-16 DIAGNOSIS — K56609 Unspecified intestinal obstruction, unspecified as to partial versus complete obstruction: Secondary | ICD-10-CM | POA: Diagnosis not present

## 2022-06-16 DIAGNOSIS — Z981 Arthrodesis status: Secondary | ICD-10-CM | POA: Diagnosis not present

## 2022-06-16 DIAGNOSIS — F1911 Other psychoactive substance abuse, in remission: Secondary | ICD-10-CM | POA: Diagnosis present

## 2022-06-16 DIAGNOSIS — K6389 Other specified diseases of intestine: Secondary | ICD-10-CM | POA: Diagnosis not present

## 2022-06-16 DIAGNOSIS — R1030 Lower abdominal pain, unspecified: Secondary | ICD-10-CM | POA: Diagnosis not present

## 2022-06-16 DIAGNOSIS — Z90710 Acquired absence of both cervix and uterus: Secondary | ICD-10-CM | POA: Diagnosis not present

## 2022-06-16 DIAGNOSIS — K5669 Other partial intestinal obstruction: Secondary | ICD-10-CM | POA: Diagnosis not present

## 2022-06-16 DIAGNOSIS — F411 Generalized anxiety disorder: Secondary | ICD-10-CM | POA: Diagnosis present

## 2022-06-16 DIAGNOSIS — Z79899 Other long term (current) drug therapy: Secondary | ICD-10-CM | POA: Diagnosis not present

## 2022-06-16 DIAGNOSIS — K589 Irritable bowel syndrome without diarrhea: Secondary | ICD-10-CM | POA: Diagnosis not present

## 2022-06-16 DIAGNOSIS — I10 Essential (primary) hypertension: Secondary | ICD-10-CM | POA: Diagnosis not present

## 2022-06-16 DIAGNOSIS — Z4682 Encounter for fitting and adjustment of non-vascular catheter: Secondary | ICD-10-CM | POA: Diagnosis not present

## 2022-06-16 DIAGNOSIS — Z8249 Family history of ischemic heart disease and other diseases of the circulatory system: Secondary | ICD-10-CM | POA: Diagnosis not present

## 2022-06-16 DIAGNOSIS — Z96651 Presence of right artificial knee joint: Secondary | ICD-10-CM | POA: Diagnosis not present

## 2022-06-16 DIAGNOSIS — Z87891 Personal history of nicotine dependence: Secondary | ICD-10-CM

## 2022-06-16 LAB — URINALYSIS, ROUTINE W REFLEX MICROSCOPIC
Bilirubin Urine: NEGATIVE
Glucose, UA: NEGATIVE mg/dL
Hgb urine dipstick: NEGATIVE
Ketones, ur: NEGATIVE mg/dL
Leukocytes,Ua: NEGATIVE
Nitrite: NEGATIVE
Protein, ur: NEGATIVE mg/dL
Specific Gravity, Urine: 1.014 (ref 1.005–1.030)
pH: 7.5 (ref 5.0–8.0)

## 2022-06-16 LAB — CBC
HCT: 39.9 % (ref 36.0–46.0)
HCT: 39.9 % (ref 36.0–46.0)
Hemoglobin: 13.6 g/dL (ref 12.0–15.0)
Hemoglobin: 13 g/dL (ref 12.0–15.0)
MCH: 33.9 pg (ref 26.0–34.0)
MCH: 33.4 pg (ref 26.0–34.0)
MCHC: 34.1 g/dL (ref 30.0–36.0)
MCHC: 32.6 g/dL (ref 30.0–36.0)
MCV: 99.5 fL (ref 80.0–100.0)
MCV: 102.6 fL — ABNORMAL HIGH (ref 80.0–100.0)
Platelets: 206 10*3/uL (ref 150–400)
Platelets: 186 10*3/uL (ref 150–400)
RBC: 4.01 MIL/uL (ref 3.87–5.11)
RBC: 3.89 MIL/uL (ref 3.87–5.11)
RDW: 12.1 % (ref 11.5–15.5)
RDW: 12.3 % (ref 11.5–15.5)
WBC: 9.4 10*3/uL (ref 4.0–10.5)
WBC: 7.3 10*3/uL (ref 4.0–10.5)
nRBC: 0 % (ref 0.0–0.2)
nRBC: 0 % (ref 0.0–0.2)

## 2022-06-16 LAB — COMPREHENSIVE METABOLIC PANEL
ALT: 22 U/L (ref 0–44)
AST: 32 U/L (ref 15–41)
Albumin: 4.7 g/dL (ref 3.5–5.0)
Alkaline Phosphatase: 80 U/L (ref 38–126)
Anion gap: 8 (ref 5–15)
BUN: 15 mg/dL (ref 8–23)
CO2: 30 mmol/L (ref 22–32)
Calcium: 9.8 mg/dL (ref 8.9–10.3)
Chloride: 101 mmol/L (ref 98–111)
Creatinine, Ser: 1.01 mg/dL — ABNORMAL HIGH (ref 0.44–1.00)
GFR, Estimated: >60 mL/min (ref >60)
Glucose, Bld: 109 mg/dL — ABNORMAL HIGH (ref 70–99)
Potassium: 3.7 mmol/L (ref 3.5–5.1)
Sodium: 139 mmol/L (ref 135–145)
Total Bilirubin: 0.5 mg/dL (ref 0.3–1.2)
Total Protein: 7.2 g/dL (ref 6.5–8.1)

## 2022-06-16 LAB — CREATININE, SERUM
Creatinine, Ser: 0.93 mg/dL (ref 0.44–1.00)
GFR, Estimated: >60 mL/min (ref >60)

## 2022-06-16 LAB — LIPASE, BLOOD: Lipase: 20 U/L (ref 11–51)

## 2022-06-16 MED ORDER — ENOXAPARIN SODIUM 40 MG/0.4ML IJ SOSY
40.0000 mg | PREFILLED_SYRINGE | INTRAMUSCULAR | Status: DC
  Administered 2022-06-18: 40 mg via SUBCUTANEOUS
  Filled 2022-06-16: qty 0.4

## 2022-06-16 MED ORDER — OXYCODONE HCL 5 MG PO TABS
10.0000 mg | ORAL_TABLET | ORAL | Status: DC | PRN
  Administered 2022-06-18 (×2): 10 mg via ORAL
  Filled 2022-06-16 (×5): qty 2

## 2022-06-16 MED ORDER — MORPHINE SULFATE (PF) 4 MG/ML IV SOLN
4.0000 mg | Freq: Once | INTRAVENOUS | Status: AC
  Administered 2022-06-16: 4 mg via INTRAVENOUS
  Filled 2022-06-16: qty 1

## 2022-06-16 MED ORDER — METHOCARBAMOL 1000 MG/10ML IJ SOLN: 500.0000 mg | Freq: Four times a day (QID) | INTRAVENOUS | Status: DC | PRN

## 2022-06-16 MED ORDER — DIATRIZOATE MEGLUMINE & SODIUM 66-10 % PO SOLN
90.0000 mL | Freq: Once | ORAL | Status: AC
  Administered 2022-06-16: 90 mL via NASOGASTRIC
  Filled 2022-06-16: qty 90

## 2022-06-16 MED ORDER — GABAPENTIN 600 MG PO TABS
300.0000 mg | ORAL_TABLET | Freq: Three times a day (TID) | ORAL | Status: DC
  Administered 2022-06-18 (×2): 300 mg via ORAL
  Filled 2022-06-16 (×3): qty 1

## 2022-06-16 MED ORDER — OXYCODONE HCL 5 MG PO TABS
5.0000 mg | ORAL_TABLET | ORAL | Status: DC | PRN
  Administered 2022-06-17: 5 mg via ORAL

## 2022-06-16 MED ORDER — SIMETHICONE 80 MG PO CHEW: 80.0000 mg | CHEWABLE_TABLET | Freq: Four times a day (QID) | ORAL | Status: DC | PRN

## 2022-06-16 MED ORDER — ACETAMINOPHEN 325 MG PO TABS
650.0000 mg | ORAL_TABLET | Freq: Four times a day (QID) | ORAL | Status: DC
  Administered 2022-06-17 – 2022-06-18 (×2): 650 mg via ORAL
  Filled 2022-06-16 (×5): qty 2

## 2022-06-16 MED ORDER — ONDANSETRON HCL 4 MG/2ML IJ SOLN
4.0000 mg | Freq: Four times a day (QID) | INTRAMUSCULAR | Status: DC | PRN
  Administered 2022-06-16 – 2022-06-17 (×2): 4 mg via INTRAVENOUS
  Filled 2022-06-16 (×2): qty 2

## 2022-06-16 MED ORDER — LACTATED RINGERS IV SOLN: INTRAVENOUS | Status: DC

## 2022-06-16 MED ORDER — HYDROMORPHONE HCL 1 MG/ML IJ SOLN
0.5000 mg | INTRAMUSCULAR | Status: DC | PRN
  Administered 2022-06-16 – 2022-06-17 (×2): 0.5 mg via INTRAVENOUS
  Filled 2022-06-16 (×2): qty 1

## 2022-06-16 MED ORDER — IOHEXOL 9 MG/ML PO SOLN
500.0000 mL | ORAL | Status: AC
  Administered 2022-06-16: 500 mL via ORAL

## 2022-06-16 MED ORDER — IOHEXOL 300 MG/ML  SOLN
100.0000 mL | Freq: Once | INTRAMUSCULAR | Status: AC | PRN
  Administered 2022-06-16: 70 mL via INTRAVENOUS

## 2022-06-16 MED ORDER — SODIUM CHLORIDE 0.9 % IV BOLUS
1000.0000 mL | Freq: Once | INTRAVENOUS | Status: AC
  Administered 2022-06-16: 1000 mL via INTRAVENOUS

## 2022-06-16 MED ORDER — PROCHLORPERAZINE EDISYLATE 10 MG/2ML IJ SOLN
10.0000 mg | INTRAMUSCULAR | Status: DC | PRN
  Administered 2022-06-17: 10 mg via INTRAVENOUS
  Filled 2022-06-16 (×3): qty 2

## 2022-06-16 MED ORDER — LORAZEPAM 2 MG/ML IJ SOLN
1.0000 mg | Freq: Once | INTRAMUSCULAR | Status: AC
Start: 2022-06-16 — End: 2022-06-16
  Administered 2022-06-16: 1 mg via INTRAVENOUS
  Filled 2022-06-16: qty 1

## 2022-06-16 NOTE — ED Triage Notes (Signed)
Pt arrives to ED with c/o lower abdominal pain that started this morning. Pt reports this started after an episode of diarrhea. Associated symptoms include belching. She reports inability to pass gas. Pt denies emesis.

## 2022-06-16 NOTE — ED Notes (Signed)
Patient transported to CT 

## 2022-06-16 NOTE — ED Notes (Signed)
XR called to check NGT placement

## 2022-06-16 NOTE — H&P (Signed)
Admitting Physician: Nickola Major Sharona Rovner  Service: General Surgery  CC: Abdominal pain  Subjective   HPI: Bethany [name] is an 66 y.o. female who is here for abdominal pain.  She had a bowel obstruction in 2019.  Dr. Brantley Stage ended up taking her to surgery on 04/22/2018 for exploratory laparotomy with lysis of adhesions and small bowel resection for chronic stenosed distal ileum secondary to long-standing adhesions.  She had another bowel obstruction in 2020 which resolved on its own.  Now she presents again with similar symptoms.  Nausea, no vomiting.  Her last bowel movement was this morning and was diarrhea.  No flatus since.  Severe abdominal distention.  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 All Paps normal afterwards   Degenerative disc disease, lumbar 05/02/2020   Dyslipidemia    Generalized anxiety disorder    Herpes genitalis    Herpes labialis    History of calcium pyrophosphate deposition disease (CPPD) 03/26/2020   History of opiate abuse, in remission    History of small bowel obstruction 04/17/2018   partial   Hyperlipidemia with target LDL less than 100 06/17/2011   IBS (irritable bowel syndrome)    Lumbago with sciatica, right side 02/10/2021   Major depressive disorder    Meibomian gland dysfunction (MGD) of both eyes 08/02/2020   Nonallopathic lesion of lumbosacral region 08/05/2017   Nonallopathic lesion of sacral region 08/05/2017   Nonallopathic lesion of thoracic region 08/05/2017   Osteopenia 04/2014   T score -1.7 FRAX 7.3%/0.8% stable from prior DEXA 2013   Raynaud's disease 06/17/2011   Recovering alcoholic in remission     S/P LASIK (laser assisted in situ  keratomileusis) 08/02/2020   right eye   Spondylolisthesis of lumbar region 02/10/2021   Status post total right knee replacement 10/31/2019   Tubular adenoma of colon 04/21/2016    Past Surgical History:  Procedure Laterality Date   AUGMENTATION MAMMAPLASTY     BREAST ENHANCEMENT SURGERY  2006   COSMETIC SURGERY     EYE SURGERY     lasik od   GYNECOLOGIC CRYOSURGERY  1990   LAPAROTOMY N/A 04/22/2018   Procedure: EXPLORATORY LAPAROTOMY;  Surgeon: Erroll Luna, MD;  Location: [place];  Service: General;  Laterality: N/A;   LIGAMENT REPAIR Right 09/24/2015   Procedure: RIGHT INDEX METACARPAL PHALANGEAL RADIAL COLLATERAL LIGAMENT REPAIR;  Surgeon: Leanora Cover, MD;  Location: [place];  Service: Orthopedics;  Laterality: Right;   MASS EXCISION N/A 08/04/2018   Procedure: REMOVAL OF SUTURE FROM ABDOMEN/ERAS PATHWAY;  Surgeon: Erroll Luna, MD;  Location: [place];  Service: General;  Laterality: N/A;   OOPHORECTOMY     BSO   Right knee arthoscopy Right 09/09/2017   [facility]   SHX 1 Left 02/19/2020   basel cell carcinoma   SHX1 Right 12/11/2020   basal cell carinoma,micronodular pattern   SKIN BIOPSY Right 02/28/2019   Superficial basal carcinoma    SKIN BIOPSY Left 11/24/2021   superficial basal carcinoma of the left forearm   TONSILLECTOMY AND ADENOIDECTOMY     TOTAL KNEE ARTHROPLASTY Right 04/12/2018   TOTAL KNEE ARTHROPLASTY Right 04/12/2018   Procedure: RIGHT TOTAL KNEE ARTHROPLASTY;  Surgeon: Melrose Nakayama, MD;  Location: [place];  Service: Orthopedics;  Laterality: Right;   TRANSFORAMINAL LUMBAR INTERBODY FUSION W/ MIS 1 LEVEL N/A 09/03/2021   Procedure: Lumbar four-five Minimally invasive transforaminal lumbar interbody fusion;  Surgeon: Dawley, Theodoro Doing, DO;  Location: [place];  Service: Neurosurgery;  Laterality: N/A;   TUBAL LIGATION     UPPER GASTROINTESTINAL ENDOSCOPY  04/24/2005   VAGINAL HYSTERECTOMY  2005   TVH BSO   adenomyosis    Family History  Problem Relation Age of Onset   Hypertension Mother    Dementia Mother        rapid decline; concerns for Creutzfeldt-Jakob disease   Lymphoma Father 53   Breast cancer Sister 45   Colon cancer Neg Hx     Social:  reports that she quit smoking about 16 years ago. Her smoking use included cigarettes. She has never used smokeless tobacco. She reports that she does not currently use alcohol. She reports that she does not currently use drugs after having used the following drugs: Oxycodone.  Allergies:  Allergies  Allergen Reactions   Morphine And Related Nausea Only    Medications: Current Outpatient Medications  Medication Instructions   acetaminophen (TYLENOL) 1,000 mg, Oral, Every 6 hours PRN   ascorbic acid (VITAMIN C) 500 mg, Oral, Every evening   bisacodyl (DULCOLAX) 5 mg, Oral, Daily PRN   cholecalciferol (VITAMIN D) 1,000 Units, Oral, Every evening   cycloSPORINE (RESTASIS) 0.05 % ophthalmic emulsion 1 drop, Both Eyes, 2 times daily   docusate sodium (COLACE) 100 mg, Oral, 2 times daily   gabapentin (NEURONTIN) 600-1,200 mg, Oral, See admin instructions, Take 600 mg by mouth in the morning and 1200 mg at night   hydrocortisone (PROCTOZONE-HC) 2.5 % rectal cream 1 application , Rectal, 2 times daily   hydrocortisone 2.5 % cream 1 Application, Topical, 2 times daily PRN   ibuprofen (ADVIL) 800 MG tablet TAKE 1 TABLET(800 MG) BY MOUTH EVERY 8 HOURS AS NEEDED   lamoTRIgine (LAMICTAL) 100 mg, Oral, 2 times daily   methocarbamol (ROBAXIN) 500 mg, Oral, Every 6 hours PRN   methocarbamol (ROBAXIN) 500 mg, Oral, 4 times daily   Multiple Vitamin (MULTIVITAMIN ADULT) TABS 1 tablet, Oral, Every evening   oxyCODONE-acetaminophen (PERCOCET) 5-325 MG tablet 1-2 tablets, Oral, Every 4 hours PRN   polyethylene glycol (MIRALAX) 17 g, Oral, Daily   pregabalin (LYRICA) 50 mg, Oral, 2 times daily   promethazine (PHENERGAN) 25 mg, Rectal, Every 6 hours PRN    simvastatin (ZOCOR) 20 MG tablet TAKE 1 TABLET(20 MG) BY MOUTH DAILY   traZODone (DESYREL) 50 mg, Oral, Daily at bedtime   zinc gluconate 50 mg, Oral, Every evening    ROS - all of the below systems have been reviewed with the patient and positives are indicated with bold text General: chills, fever or night sweats Eyes: blurry vision or double vision ENT: epistaxis or sore throat Allergy/Immunology: itchy/watery eyes or nasal congestion Hematologic/Lymphatic: bleeding problems, blood clots or swollen lymph nodes Endocrine: temperature intolerance or unexpected weight changes Breast: new or changing breast lumps or nipple discharge Resp: cough, shortness of breath, or wheezing CV: chest pain or dyspnea on exertion GI: as per HPI GU: dysuria, trouble voiding, or hematuria MSK: joint pain or joint stiffness Neuro: TIA or stroke symptoms Derm: pruritus and skin lesion changes Psych: anxiety and depression  Objective   PE Blood pressure (!) 145/78, pulse 60, temperature 98.2 F (36.8 C), resp. rate 18, height '5\' 5"'$  (1.651 m), weight 52.2 kg, SpO2  98 %. Constitutional: NAD; conversant; no deformities Eyes: Moist conjunctiva; no lid lag; anicteric; PERRL Neck: Trachea midline; no thyromegaly Lungs: Normal respiratory effort; no tactile fremitus CV: RRR; no palpable thrills; no pitting edema GI: Abd Distended, mild tenderness; no palpable hepatosplenomegaly MSK: Normal range of motion of extremities; no clubbing/cyanosis Psychiatric: Appropriate affect; alert and oriented x3 Lymphatic: No palpable cervical or axillary lymphadenopathy  Results for orders placed or performed during the hospital encounter of 06/16/22 (from the past 24 hour(s))  Lipase, blood     Status: None   Collection Time: 06/16/22  2:35 PM  Result Value Ref Range   Lipase 20 11 - 51 U/L  Comprehensive metabolic panel     Status: Abnormal   Collection Time: 06/16/22  2:35 PM  Result Value Ref Range   Sodium 139  135 - 145 mmol/L   Potassium 3.7 3.5 - 5.1 mmol/L   Chloride 101 98 - 111 mmol/L   CO2 30 22 - 32 mmol/L   Glucose, Bld 109 (H) 70 - 99 mg/dL   BUN 15 8 - 23 mg/dL   Creatinine, Ser 1.01 (H) 0.44 - 1.00 mg/dL   Calcium 9.8 8.9 - 10.3 mg/dL   Total Protein 7.2 6.5 - 8.1 g/dL   Albumin 4.7 3.5 - 5.0 g/dL   AST 32 15 - 41 U/L   ALT 22 0 - 44 U/L   Alkaline Phosphatase 80 38 - 126 U/L   Total Bilirubin 0.5 0.3 - 1.2 mg/dL   GFR, Estimated >60 >60 mL/min   Anion gap 8 5 - 15  CBC     Status: None   Collection Time: 06/16/22  2:35 PM  Result Value Ref Range   WBC 9.4 4.0 - 10.5 K/uL   RBC 4.01 3.87 - 5.11 MIL/uL   Hemoglobin 13.6 12.0 - 15.0 g/dL   HCT 39.9 36.0 - 46.0 %   MCV 99.5 80.0 - 100.0 fL   MCH 33.9 26.0 - 34.0 pg   MCHC 34.1 30.0 - 36.0 g/dL   RDW 12.1 11.5 - 15.5 %   Platelets 206 150 - 400 K/uL   nRBC 0.0 0.0 - 0.2 %  Urinalysis, Routine w reflex microscopic Urine, Clean Catch     Status: Abnormal   Collection Time: 06/16/22  3:35 PM  Result Value Ref Range   Color, Urine YELLOW YELLOW   APPearance HAZY (A) CLEAR   Specific Gravity, Urine 1.014 1.005 - 1.030   pH 7.5 5.0 - 8.0   Glucose, UA NEGATIVE NEGATIVE mg/dL   Hgb urine dipstick NEGATIVE NEGATIVE   Bilirubin Urine NEGATIVE NEGATIVE   Ketones, ur NEGATIVE NEGATIVE mg/dL   Protein, ur NEGATIVE NEGATIVE mg/dL   Nitrite NEGATIVE NEGATIVE   Leukocytes,Ua NEGATIVE NEGATIVE   RBC / HPF 0-5 0 - 5 RBC/hpf   WBC, UA 0-5 0 - 5 WBC/hpf   Amorphous Crystal PRESENT      Imaging Orders         CT ABDOMEN PELVIS W CONTRAST    Moderate gastric distention is noted as well as severe proximal small bowel dilatation with probable transition zone seen in the central portion of the abdomen. This is consistent with distal small-bowel obstruction, possibly due to adhesion.   Assessment and Plan   Eisha Chatterjee Adan is an 66 y.o. female with a small bowel obstruction likely due to adhesions.  I recommend trying the small bowel  protocol tonight.  It does not appear that she needs emergent surgery  based on initial imaging and exam.  If the obstruction fails to resolve, she may need surgery this admission.  Surgery team will admit the patient and follow her closely.  Felicie Morn, MD  [facility] Use [email or web address] to contact on call provider  New Patient Billing: [phone] - Moderate MDM

## 2022-06-16 NOTE — ED Notes (Signed)
Gastrografin given and NGT clamped.

## 2022-06-16 NOTE — ED Provider Notes (Signed)
[facility] EMERGENCY DEPT Provider Note   CSN: [id number] Arrival date & time: 06/16/22  1410     History  Chief Complaint  Patient presents with   Abdominal Pain    Bethany [name] is a 67 y.o. female.  The history is provided by the patient and medical records. No language interpreter was used.  Abdominal Pain    66 year old female significant history of SBO, IBS, alcohol and misuse in remission, surgical history including hysterectomy, history of skin cancer who presents to the ED with complaints of abdominal pain.  Patient endorses diffuse abdominal discomfort that started this morning.  States pain is similar to prior SBO that she has had in the past.  She endorsed nausea and sensation of reflux.  She also reported 1 episode of loose stool early this morning but since then she has difficulty passing flatus.  Pain is moderate in severity.  She does not endorse any fever or chills no chest pain shortness of breath productive cough or dysuria.  Home Medications Prior to Admission medications   Medication Sig Start Date End Date Taking? Authorizing Provider  acetaminophen (TYLENOL) 500 MG tablet Take 1,000 mg by mouth every 6 (six) hours as needed.    [provider]  b complex vitamins tablet Take 1 tablet by mouth daily.    [provider]  Baclofen 5 MG TABS Take by mouth. Patient not taking: Reported on 03/03/2022 10/29/21   [provider]  bisacodyl (DULCOLAX) 5 MG EC tablet Take 1 tablet (5 mg total) by mouth daily as needed for moderate constipation. 04/14/18   Loni Dolly, PA-C  cholecalciferol (VITAMIN D) 1000 UNITS tablet Take 1,000 Units by mouth daily.    [provider]  cycloSPORINE (RESTASIS) 0.05 % ophthalmic emulsion Place 1 drop into both eyes 2 (two) times daily. 11/13/19   [provider]  docusate sodium (COLACE) 100 MG capsule Take 1 capsule (100 mg total) by mouth 2 (two) times daily. Patient not  taking: Reported on 08/21/2021 04/14/18   Loni Dolly, PA-C  gabapentin (NEURONTIN) 600 MG tablet Take 600-1,200 mg by mouth See admin instructions. Take 600 mg by mouth in the morning and 1200 mg at night    [provider]  hydrocortisone (PROCTOZONE-HC) 2.5 % rectal cream Place 1 application rectally 2 (two) times daily. 05/27/21   Denita Lung, MD  hydrocortisone 2.5 % cream hydrocortisone 2.5 % topical cream with perineal applicator  APPLY RECTALLY TO THE AFFECTED AREA TWICE DAILY    [provider]  ibuprofen (ADVIL) 800 MG tablet TAKE 1 TABLET(800 MG) BY MOUTH EVERY 8 HOURS AS NEEDED 03/02/22   Denita Lung, MD  lamoTRIgine (LAMICTAL) 100 MG tablet Take 100 mg by mouth 2 (two) times daily.  05/19/15   [provider]  lidocaine (LIDODERM) 5 % 1 patch daily. Patient not taking: Reported on 04/23/2022 10/30/21   [provider]  methocarbamol (ROBAXIN) 500 MG tablet Take 1 tablet (500 mg total) by mouth every 6 (six) hours as needed for muscle spasms. Patient not taking: Reported on 08/21/2021 10/03/20   Kathie Dike, MD  methocarbamol (ROBAXIN) 500 MG tablet Take 1 tablet (500 mg total) by mouth 4 (four) times daily. Patient not taking: Reported on 12/11/2021 09/05/21   Marvis Moeller, NP  Omega-3 Fatty Acids (FISH OIL PO) Take by mouth.    [provider]  oxyCODONE-acetaminophen (PERCOCET) 5-325 MG tablet Take 1-2 tablets by mouth every 4 (four) hours as  needed for severe pain. Patient not taking: Reported on 12/11/2021 09/05/21 09/05/22  Marvis Moeller, NP  polyethylene glycol (MIRALAX) 17 g packet Take 17 g by mouth daily. Patient not taking: Reported on 03/03/2022 10/03/20   Kathie Dike, MD  pregabalin (LYRICA) 50 MG capsule Take 1 capsule (50 mg total) by mouth 2 (two) times daily. Patient not taking: Reported on 12/11/2021 10/28/21   Lyndal Pulley, DO  promethazine (PHENERGAN) 25 MG suppository Place 1 suppository (25 mg total)  rectally every 6 (six) hours as needed for nausea. Patient not taking: Reported on 08/21/2021 10/03/20 10/03/21  Kathie Dike, MD  simvastatin (ZOCOR) 20 MG tablet TAKE 1 TABLET(20 MG) BY MOUTH DAILY 04/23/22   Denita Lung, MD  traZODone (DESYREL) 50 MG tablet Take 50 mg by mouth at bedtime. 10/23/19   [provider]  vitamin C (ASCORBIC ACID) 500 MG tablet Take 500 mg by mouth daily.    [provider]  zinc gluconate 50 MG tablet Take 50 mg by mouth daily.    [provider]      Allergies    Morphine and related    Review of Systems   Review of Systems  Gastrointestinal:  Positive for abdominal pain.  All other systems reviewed and are negative.   Physical Exam Updated Vital Signs BP (!) 162/96 (BP Location: Right Arm)   Pulse 66   Temp 98 F (36.7 C) (Temporal)   Resp 18   Ht '5\' 5"'$  (1.651 m)   Wt 52.2 kg   SpO2 100%   BMI 19.14 kg/m  Physical Exam Vitals and nursing note reviewed.  Constitutional:      General: She is not in acute distress.    Appearance: She is well-developed.  HENT:     Head: Atraumatic.  Eyes:     Conjunctiva/sclera: Conjunctivae normal.  Cardiovascular:     Rate and Rhythm: Normal rate and regular rhythm.     Heart sounds: Normal heart sounds.  Pulmonary:     Effort: Pulmonary effort is normal.  Abdominal:     General: A surgical scar is present. Bowel sounds are decreased. There is no distension.     Palpations: Abdomen is rigid.  Musculoskeletal:     Cervical back: Neck supple.  Skin:    Findings: No rash.  Neurological:     Mental Status: She is alert.  Psychiatric:        Mood and Affect: Mood normal.     ED Results / Procedures / Treatments   Labs (all labs ordered are listed, but only abnormal results are displayed) Labs Reviewed  COMPREHENSIVE METABOLIC PANEL - Abnormal; Notable for the following components:      Result Value   Glucose, Bld 109 (*)    Creatinine, Ser 1.01 (*)    All other  components within normal limits  URINALYSIS, ROUTINE W REFLEX MICROSCOPIC - Abnormal; Notable for the following components:   APPearance HAZY (*)    All other components within normal limits  LIPASE, BLOOD  CBC    EKG None  Radiology CT ABDOMEN PELVIS W CONTRAST  Result Date: 06/16/2022 CLINICAL DATA:  Acute lower abdominal pain. EXAM: CT ABDOMEN AND PELVIS WITH CONTRAST TECHNIQUE: Multidetector CT imaging of the abdomen and pelvis was performed using the standard protocol following bolus administration of intravenous contrast. RADIATION DOSE REDUCTION: This exam was performed according to the departmental dose-optimization program which includes automated exposure control, adjustment of the mA and/or kV according to  patient size and/or use of iterative reconstruction technique. CONTRAST:  67m OMNIPAQUE IOHEXOL 300 MG/ML  SOLN COMPARISON:  October 01, 2020. FINDINGS: Lower chest: No acute abnormality. Hepatobiliary: No focal liver abnormality is seen. No gallstones, gallbladder wall thickening, or biliary dilatation. Pancreas: Unremarkable. No pancreatic ductal dilatation or surrounding inflammatory changes. Spleen: Normal in size without focal abnormality. Adrenals/Urinary Tract: Adrenal glands are unremarkable. Kidneys are normal, without renal calculi, focal lesion, or hydronephrosis. Bladder is unremarkable. Stomach/Bowel: Moderate gastric distention is noted. Severe proximal small bowel dilatation is noted with probable transition zone seen in the central portion of the abdomen best seen on image number 61 of series 6. This is consistent with distal small bowel obstruction, potentially due to adhesion. Stool is noted in nondilated colon. Vascular/Lymphatic: No significant vascular findings are present. No enlarged abdominal or pelvic lymph nodes. Reproductive: Status post hysterectomy. No adnexal masses. Other: No hernia is noted. Small amount of free fluid is noted in the pelvis which may be  physiologic. Musculoskeletal: Status post surgical posterior fusion of L4-5. No acute osseous abnormality is noted. IMPRESSION: Moderate gastric distention is noted as well as severe proximal small bowel dilatation with probable transition zone seen in the central portion of the abdomen. This is consistent with distal small-bowel obstruction, possibly due to adhesion. Electronically Signed   By: JMarijo ConceptionM.D.   On: 06/16/2022 17:30    Procedures Procedures    Medications Ordered in ED Medications  LORazepam (ATIVAN) injection 1 mg (has no administration in time range)  morphine (PF) 4 MG/ML injection 4 mg (has no administration in time range)  morphine (PF) 4 MG/ML injection 4 mg (4 mg Intravenous Given 06/16/22 1617)  sodium chloride 0.9 % bolus 1,000 mL (1,000 mLs Intravenous New Bag/Given 06/16/22 1621)  iohexol (OMNIPAQUE) 9 MG/ML oral solution 500 mL (500 mLs Oral Contrast Given 06/16/22 1611)  iohexol (OMNIPAQUE) 300 MG/ML solution 100 mL (70 mLs Intravenous Contrast Given 06/16/22 1713)    ED Course/ Medical Decision Making/ A&P                           Medical Decision Making Amount and/or Complexity of Data Reviewed Labs: ordered. Radiology: ordered.  Risk Prescription drug management.   BP (!) 162/96 (BP Location: Right Arm)   Pulse 66   Temp 98 F (36.7 C) (Temporal)   Resp 18   Ht '5\' 5"'$  (1.651 m)   Wt 52.2 kg   SpO2 100%   BMI 19.14 kg/m   3:53 PM This is a 66year old female significant history of small bowel obstruction, IBS, hyperlipidemia, anxiety, polysubstance abuse in remission presenting with complaint of abdominal pain.  She endorsed diffuse abdominal discomfort that started this morning.  She did report 1 bouts of loose stools but since then she is unable to pass flatus.  She felt her symptom is similar to prior SBO that she has had in the past.  States she had twice, 1 of which require abdominal surgery while another episode required hospitalization  with NG tube.  On exam, this is a well-appearing female appears to be in no acute discomfort.  Vitals are remarkable for blood pressure of 162/96.  She does have history of hypertension.  Abdominal exam with decreased bowel sounds and slight tense abdomen with diffuse tenderness.  Given her presentation, will obtain abdominal pelvis CT scan for further assessment.  Labs obtained and reviewed interpreted by me.  Patient has normal WBC,  normal H&H, electrolyte panels are reassuring.  Creatinine is at 1.01.  We will give IV fluid and opiate pain medication.  Will await CT result.  5:57 PM Labs and imaging obtained independently viewed interpreted by me and I agree with radiologist interpretation.  Urinalysis without signs of urinary tract infection, normal lipase, normal electrolyte panel, normal WBC, normal H&H.  CT scan of the abdomen pelvis demonstrate moderate gastric distention as well as severe proximal small bowel dilation with probable transition zone seen in the central portion of the abdomen.  This is consistent with distal small bowel obstruction, possibly due to adhesion.  I have discussed this finding with patient and offer NG tube but patient prefers to delay NG tube at this time.  She should request for additional pain medication and antianxiety medication.  I will also consult general surgery to have patient admitted for further care.  Patient voiced understanding and agrees with plan.  6:25 PM Appreciate consultation to general surgeon Dr. Thermon Leyland who request pt to be transfer ER to ER to [facility] for him to assess and determine disposition. I have notified attending Dr. Deno Etienne at [facility] who agrees to accept pt.   This patient presents to the ED for concern of abd pain, this involves an extensive number of treatment options, and is a complaint that carries with it a high risk of complications and morbidity.  The differential diagnosis includes SBO, appendicitis, colitis,  diverticulitis, kidney stone, UTI, pancreatitis  Co morbidities that complicate the patient evaluation hx of SBO Additional history obtained:  Additional history obtained from family member External records from outside source obtained and reviewed including EMR including prior labs and imaging  Lab Tests:  I Ordered, and personally interpreted labs.  The pertinent results include:  as above  Imaging Studies ordered:  I ordered imaging studies including abd/pelvis CT I independently visualized and interpreted imaging which showed SBO I agree with the radiologist interpretation  Cardiac Monitoring:  The patient was maintained on a cardiac monitor.  I personally viewed and interpreted the cardiac monitored which showed an underlying rhythm of: NSR  Medicines ordered and prescription drug management:  I ordered medication including morphine  for pain Reevaluation of the patient after these medicines showed that the patient improved I have reviewed the patients home medicines and have made adjustments as needed  Test Considered: as above  Critical Interventions: NPO  IV opiate medication  Consultations Obtained:  I requested consultation with the surgeon Dr. Ebony Hail,  and discussed lab and imaging findings as well as pertinent plan - they recommend: ER to ER transfer for further eval  Problem List / ED Course: SBO  Reevaluation:  After the interventions noted above, I reevaluated the patient and found that they have :improved  Social Determinants of Health: tobacco use  Dispostion:  After consideration of the diagnostic results and the patients response to treatment, I feel that the patent would benefit from admission.         Final Clinical Impression(s) / ED Diagnoses Final diagnoses:  SBO (small bowel obstruction) [facility])    Rx / DC Orders ED Discharge Orders     None         Domenic Moras, PA-C 06/16/22 1837    Ottie Glazier,  DO 06/17/22 0021

## 2022-06-16 NOTE — ED Notes (Signed)
Pt c/o nausea and pain, PRN medications to be administered.

## 2022-06-16 NOTE — ED Notes (Signed)
Pt's NGT unclamped and back on int suction.

## 2022-06-16 NOTE — ED Notes (Signed)
Pt transferred to [facility] via [place] from [place] for bowel obstruction. Pt AO4, IV 20G

## 2022-06-16 NOTE — ED Provider Notes (Signed)
  Physical Exam  BP (!) 145/78   Pulse 60   Temp 98.2 F (36.8 C)   Resp 18   Ht '5\' 5"'$  (1.651 m)   Wt 52.2 kg   SpO2 98%   BMI 19.14 kg/m   Physical Exam  Procedures  Procedures  ED Course / MDM    Medical Decision Making Patient is transferred from [place] for SBO.  Signout pending surgery evaluation.  8:46 PM Surgery saw patient and surgery to admit for SBO.  Amount and/or Complexity of Data Reviewed Labs: ordered. Radiology: ordered.  Risk Prescription drug management. Decision regarding hospitalization.          Drenda Freeze, MD 06/16/22 2046

## 2022-06-17 ENCOUNTER — Inpatient Hospital Stay (HOSPITAL_COMMUNITY): Payer: Federal, State, Local not specified - PPO

## 2022-06-17 DIAGNOSIS — K56699 Other intestinal obstruction unspecified as to partial versus complete obstruction: Secondary | ICD-10-CM | POA: Diagnosis not present

## 2022-06-17 DIAGNOSIS — K6389 Other specified diseases of intestine: Secondary | ICD-10-CM | POA: Diagnosis not present

## 2022-06-17 DIAGNOSIS — K5939 Other megacolon: Secondary | ICD-10-CM | POA: Diagnosis not present

## 2022-06-17 DIAGNOSIS — K56609 Unspecified intestinal obstruction, unspecified as to partial versus complete obstruction: Secondary | ICD-10-CM | POA: Diagnosis not present

## 2022-06-17 DIAGNOSIS — K5669 Other partial intestinal obstruction: Secondary | ICD-10-CM | POA: Diagnosis not present

## 2022-06-17 LAB — HIV ANTIBODY (ROUTINE TESTING W REFLEX): HIV Screen 4th Generation wRfx: NONREACTIVE

## 2022-06-17 MED ORDER — LIP MEDEX EX OINT
TOPICAL_OINTMENT | CUTANEOUS | Status: DC | PRN
  Filled 2022-06-17: qty 7

## 2022-06-17 MED ORDER — DIATRIZOATE MEGLUMINE & SODIUM 66-10 % PO SOLN
90.0000 mL | Freq: Once | ORAL | Status: AC
Start: 2022-06-17 — End: 2022-06-17
  Administered 2022-06-17: 90 mL via NASOGASTRIC
  Filled 2022-06-17: qty 90

## 2022-06-17 NOTE — Plan of Care (Signed)

## 2022-06-17 NOTE — Progress Notes (Signed)
Subjective: NGT clogged this morning and unclogged by myself with flush.  Doesn't feel great.  No bowel function yet.  Threw up around NGT about 1 hour ago.  Objective: Vital signs in last 24 hours: Temp:  [97.7 F (36.5 C)-98.2 F (36.8 C)] 97.7 F (36.5 C) (08/16 0238) Pulse Rate:  [58-68] 67 (08/16 0238) Resp:  [16-19] 16 (08/16 0238) BP: (102-173)/(59-96) 102/62 (08/16 0238) SpO2:  [97 %-100 %] 98 % (08/16 0238) Weight:  [52.2 kg] 52.2 kg (08/15 1429) Last BM Date : 06/16/22  Intake/Output from previous day: 08/15 0701 - 08/16 0700 In: 1844.2 [I.V.:843.4; IV Piggyback:1000.8] Out: 1650  Intake/Output this shift: No intake/output data recorded.  PE: Gen: NAD, mostly sleeping Heart: regular Lungs: normal respiratory effort Abd: soft, less distended, still mild diffuse tenderness, NGT in place and now working with bilious output  Lab Results:  Recent Labs    06/16/22 1435 06/16/22 2135  WBC 9.4 7.3  HGB 13.6 13.0  HCT 39.9 39.9  PLT 206 186   BMET Recent Labs    06/16/22 1435 06/16/22 2135  NA 139  --   K 3.7  --   CL 101  --   CO2 30  --   GLUCOSE 109*  --   BUN 15  --   CREATININE 1.01* 0.93  CALCIUM 9.8  --    PT/INR No results for input(s): "LABPROT", "INR" in the last 72 hours. CMP     Component Value Date/Time   NA 139 06/16/2022 1435   NA 143 04/23/2022 1444   K 3.7 06/16/2022 1435   CL 101 06/16/2022 1435   CO2 30 06/16/2022 1435   GLUCOSE 109 (H) 06/16/2022 1435   BUN 15 06/16/2022 1435   BUN 13 04/23/2022 1444   CREATININE 0.93 06/16/2022 2135   CREATININE 1.05 (H) 03/26/2017 0929   CALCIUM 9.8 06/16/2022 1435   PROT 7.2 06/16/2022 1435   PROT 6.9 04/23/2022 1444   ALBUMIN 4.7 06/16/2022 1435   ALBUMIN 4.7 04/23/2022 1444   AST 32 06/16/2022 1435   ALT 22 06/16/2022 1435   ALKPHOS 80 06/16/2022 1435   BILITOT 0.5 06/16/2022 1435   BILITOT 0.3 04/23/2022 1444   GFRNONAA >60 06/16/2022 2135   GFRAA 83 10/18/2020 1227    Lipase     Component Value Date/Time   LIPASE 20 06/16/2022 1435       Studies/Results: DG Abd Portable 1V-Small Bowel Protocol-Position Verification  Result Date: 06/16/2022 CLINICAL DATA:  NG tube placement.  Small-bowel obstruction EXAM: PORTABLE ABDOMEN - 1 VIEW COMPARISON:  None Available. FINDINGS: NG tube extends the stomach. Side port below the GE junction. Dilated loops of small bowel noted. IMPRESSION: NG tube in stomach. Electronically Signed   By: Suzy Bouchard M.D.   On: 06/16/2022 21:26   CT ABDOMEN PELVIS W CONTRAST  Result Date: 06/16/2022 CLINICAL DATA:  Acute lower abdominal pain. EXAM: CT ABDOMEN AND PELVIS WITH CONTRAST TECHNIQUE: Multidetector CT imaging of the abdomen and pelvis was performed using the standard protocol following bolus administration of intravenous contrast. RADIATION DOSE REDUCTION: This exam was performed according to the departmental dose-optimization program which includes automated exposure control, adjustment of the mA and/or kV according to patient size and/or use of iterative reconstruction technique. CONTRAST:  109m OMNIPAQUE IOHEXOL 300 MG/ML  SOLN COMPARISON:  October 01, 2020. FINDINGS: Lower chest: No acute abnormality. Hepatobiliary: No focal liver abnormality is seen. No gallstones, gallbladder wall thickening, or biliary dilatation.  Pancreas: Unremarkable. No pancreatic ductal dilatation or surrounding inflammatory changes. Spleen: Normal in size without focal abnormality. Adrenals/Urinary Tract: Adrenal glands are unremarkable. Kidneys are normal, without renal calculi, focal lesion, or hydronephrosis. Bladder is unremarkable. Stomach/Bowel: Moderate gastric distention is noted. Severe proximal small bowel dilatation is noted with probable transition zone seen in the central portion of the abdomen best seen on image number 61 of series 6. This is consistent with distal small bowel obstruction, potentially due to adhesion. Stool is noted  in nondilated colon. Vascular/Lymphatic: No significant vascular findings are present. No enlarged abdominal or pelvic lymph nodes. Reproductive: Status post hysterectomy. No adnexal masses. Other: No hernia is noted. Small amount of free fluid is noted in the pelvis which may be physiologic. Musculoskeletal: Status post surgical posterior fusion of L4-5. No acute osseous abnormality is noted. IMPRESSION: Moderate gastric distention is noted as well as severe proximal small bowel dilatation with probable transition zone seen in the central portion of the abdomen. This is consistent with distal small-bowel obstruction, possibly due to adhesion. Electronically Signed   By: Marijo Conception M.D.   On: 06/16/2022 17:30    Anti-infectives: Anti-infectives (From admission, onward)    None        Assessment/Plan SBO -patient has had prior multiple SBOs some requiring surgery and some improving with conservative management. -cont bowel rest and NGT -initial 8 hr delay film with contrast remaining in stomach -repeat 24 hour film in am, but suspect most of her contrast will have been sucked out by NGT now that it is working again. -mobilize/pulm toilet   FEN - NPO/NGT/IVFs VTE - Lovenox ID - none  Anxiety HLD  I reviewed nursing notes, last 24 h vitals and pain scores, last 48 h intake and output, last 24 h labs and trends, and last 24 h imaging results.   LOS: 1 day    Henreitta Cea , [facility] 06/17/2022, 8:04 AM Please see Amion for pager number during day hours 7:00am-4:30pm or 7:00am -11:30am on weekends

## 2022-06-18 ENCOUNTER — Inpatient Hospital Stay (HOSPITAL_COMMUNITY): Payer: Federal, State, Local not specified - PPO

## 2022-06-18 DIAGNOSIS — K5669 Other partial intestinal obstruction: Secondary | ICD-10-CM | POA: Diagnosis not present

## 2022-06-18 MED ORDER — TRAZODONE HCL 50 MG PO TABS
50.0000 mg | ORAL_TABLET | Freq: Every day | ORAL | Status: DC
  Administered 2022-06-18: 50 mg via ORAL
  Filled 2022-06-18: qty 1

## 2022-06-18 MED ORDER — LAMOTRIGINE 100 MG PO TABS
100.0000 mg | ORAL_TABLET | Freq: Two times a day (BID) | ORAL | Status: DC
  Administered 2022-06-18: 100 mg via ORAL
  Filled 2022-06-18 (×2): qty 1

## 2022-06-18 NOTE — Plan of Care (Signed)

## 2022-06-18 NOTE — Progress Notes (Signed)
Subjective: Feeling better.  + flatus and diarrhea.  Objective: Vital signs in last 24 hours: Temp:  [97.9 F (36.6 C)-98.4 F (36.9 C)] 98.4 F (36.9 C) (08/17 0722) Pulse Rate:  [61-76] 73 (08/17 0722) Resp:  [13] 13 (08/17 0722) BP: (135-149)/(68-69) 149/68 (08/17 0722) SpO2:  [98 %-99 %] 99 % (08/17 0722) Last BM Date : 06/16/22  Intake/Output from previous day: 08/16 0701 - 08/17 0700 In: -  Out: 450 [Emesis/NG output:450] Intake/Output this shift: Total I/O In: -  Out: 1 [Stool:1]  PE: Gen: NAD Lungs: normal respiratory effort Abd: soft, ND, +BS, NT, NGT with only 450cc document yesterday  Lab Results:  Recent Labs    06/16/22 1435 06/16/22 2135  WBC 9.4 7.3  HGB 13.6 13.0  HCT 39.9 39.9  PLT 206 186   BMET Recent Labs    06/16/22 1435 06/16/22 2135  NA 139  --   K 3.7  --   CL 101  --   CO2 30  --   GLUCOSE 109*  --   BUN 15  --   CREATININE 1.01* 0.93  CALCIUM 9.8  --    PT/INR No results for input(s): "LABPROT", "INR" in the last 72 hours. CMP     Component Value Date/Time   NA 139 06/16/2022 1435   NA 143 04/23/2022 1444   K 3.7 06/16/2022 1435   CL 101 06/16/2022 1435   CO2 30 06/16/2022 1435   GLUCOSE 109 (H) 06/16/2022 1435   BUN 15 06/16/2022 1435   BUN 13 04/23/2022 1444   CREATININE 0.93 06/16/2022 2135   CREATININE 1.05 (H) 03/26/2017 0929   CALCIUM 9.8 06/16/2022 1435   PROT 7.2 06/16/2022 1435   PROT 6.9 04/23/2022 1444   ALBUMIN 4.7 06/16/2022 1435   ALBUMIN 4.7 04/23/2022 1444   AST 32 06/16/2022 1435   ALT 22 06/16/2022 1435   ALKPHOS 80 06/16/2022 1435   BILITOT 0.5 06/16/2022 1435   BILITOT 0.3 04/23/2022 1444   GFRNONAA >60 06/16/2022 2135   GFRAA 83 10/18/2020 1227   Lipase     Component Value Date/Time   LIPASE 20 06/16/2022 1435       Studies/Results: DG Abd Portable 1V-Small Bowel Obstruction Protocol-initial, 8 hr delay  Result Date: 06/17/2022 CLINICAL DATA:  Follow up small bowel  obstruction EXAM: PORTABLE ABDOMEN - 1 VIEW COMPARISON:  Film from earlier in the same day. FINDINGS: Previously administered contrast now lies almost predominately within the colon consistent with partial small bowel obstruction. Some decrease in the degree of small-bowel dilatation is noted as well. Gastric catheter remains in the stomach. IMPRESSION: Slight improvement in small bowel dilatation. Previously seen contrast now lies within the colon consistent with a partial small bowel obstruction. Electronically Signed   By: Inez Catalina M.D.   On: 06/17/2022 23:58   DG Abd Portable 1V-Small Bowel Obstruction Protocol-initial, 8 hr delay  Result Date: 06/17/2022 CLINICAL DATA:  8 hours small-bowel delayed film. EXAM: PORTABLE ABDOMEN - 1 VIEW COMPARISON:  None Available. FINDINGS: Persistent dilation of loops of small bowel measuring up to 5 cm. Radiopaque enteric contrast material is visualized within small bowel. No radiopaque enteric contrast material identified in the colon. Nasogastric tube with tip and side port projecting over the stomach. IMPRESSION: Persistent small bowel obstruction. No radiopaque enteric contrast material identified in large bowel. Electronically Signed   By: Dahlia Bailiff M.D.   On: 06/17/2022 08:15   DG Abd Portable 1V-Small  Bowel Protocol-Position Verification  Result Date: 06/16/2022 CLINICAL DATA:  NG tube placement.  Small-bowel obstruction EXAM: PORTABLE ABDOMEN - 1 VIEW COMPARISON:  None Available. FINDINGS: NG tube extends the stomach. Side port below the GE junction. Dilated loops of small bowel noted. IMPRESSION: NG tube in stomach. Electronically Signed   By: Suzy Bouchard M.D.   On: 06/16/2022 21:26   CT ABDOMEN PELVIS W CONTRAST  Result Date: 06/16/2022 CLINICAL DATA:  Acute lower abdominal pain. EXAM: CT ABDOMEN AND PELVIS WITH CONTRAST TECHNIQUE: Multidetector CT imaging of the abdomen and pelvis was performed using the standard protocol following bolus  administration of intravenous contrast. RADIATION DOSE REDUCTION: This exam was performed according to the departmental dose-optimization program which includes automated exposure control, adjustment of the mA and/or kV according to patient size and/or use of iterative reconstruction technique. CONTRAST:  28m OMNIPAQUE IOHEXOL 300 MG/ML  SOLN COMPARISON:  October 01, 2020. FINDINGS: Lower chest: No acute abnormality. Hepatobiliary: No focal liver abnormality is seen. No gallstones, gallbladder wall thickening, or biliary dilatation. Pancreas: Unremarkable. No pancreatic ductal dilatation or surrounding inflammatory changes. Spleen: Normal in size without focal abnormality. Adrenals/Urinary Tract: Adrenal glands are unremarkable. Kidneys are normal, without renal calculi, focal lesion, or hydronephrosis. Bladder is unremarkable. Stomach/Bowel: Moderate gastric distention is noted. Severe proximal small bowel dilatation is noted with probable transition zone seen in the central portion of the abdomen best seen on image number 61 of series 6. This is consistent with distal small bowel obstruction, potentially due to adhesion. Stool is noted in nondilated colon. Vascular/Lymphatic: No significant vascular findings are present. No enlarged abdominal or pelvic lymph nodes. Reproductive: Status post hysterectomy. No adnexal masses. Other: No hernia is noted. Small amount of free fluid is noted in the pelvis which may be physiologic. Musculoskeletal: Status post surgical posterior fusion of L4-5. No acute osseous abnormality is noted. IMPRESSION: Moderate gastric distention is noted as well as severe proximal small bowel dilatation with probable transition zone seen in the central portion of the abdomen. This is consistent with distal small-bowel obstruction, possibly due to adhesion. Electronically Signed   By: JMarijo ConceptionM.D.   On: 06/16/2022 17:30    Anti-infectives: Anti-infectives (From admission, onward)     None        Assessment/Plan SBO -resolving clinically and radiographically -Dc NGT today -CLD  FEN - CLD VTE - Lovenox ID - none  Anxiety HLD  I reviewed nursing notes, last 24 h vitals and pain scores, last 48 h intake and output, last 24 h labs and trends, and last 24 h imaging results.   LOS: 2 days    KHenreitta Cea, [facility] 06/18/2022, 8:34 AM Please see Amion for pager number during day hours 7:00am-4:30pm or 7:00am -11:30am on weekends

## 2022-06-19 DIAGNOSIS — K5669 Other partial intestinal obstruction: Secondary | ICD-10-CM | POA: Diagnosis not present

## 2022-06-19 NOTE — Discharge Summary (Signed)
Patient ID: Bethany [name] [id number] [birth year] 66 y.o.  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 08/02/2020   Meibomian gland dysfunction (MGD) of both eyes 08/02/2020   S/P LASIK (laser assisted in situ keratomileusis) 08/02/2020   Degenerative disc disease, lumbar 05/02/2020   History of calcium pyrophosphate deposition disease (CPPD) 03/26/2020   Arthritis of carpometacarpal ([place]) joint of left thumb 01/18/2020   Status post total right knee replacement 10/31/2019   ADHD (attention deficit hyperactivity disorder) 09/13/2018   IBS (irritable bowel syndrome) 04/17/2018   History of small bowel obstruction 04/17/2018   Nonallopathic lesion of thoracic region 08/05/2017   Nonallopathic lesion of lumbosacral region 08/05/2017   Nonallopathic lesion of sacral region 08/05/2017   Tubular adenoma of colon 04/21/2016   Major depressive disorder    Hyperlipidemia with target LDL less than 100 06/17/2011   Raynaud's disease 06/17/2011    Consultants none  Reason for Admission: Bethany [name] is an 66 y.o. female who is here for abdominal pain.  She had a bowel obstruction in 2019.  Dr. Brantley Stage ended up taking her to surgery on 04/22/2018 for exploratory laparotomy with lysis of adhesions and small bowel resection for chronic stenosed distal ileum secondary to long-standing adhesions.  She had another bowel obstruction in 2020 which resolved on its own.  Now  she presents again with similar symptoms.  Nausea, no vomiting.  Her last bowel movement was this morning and was diarrhea.  No flatus since.  Severe abdominal distention.  Procedures none  Hospital Course:  The patient was admitted and NGT placed.  SBO protocol showed resolution of obstruction and her NGT was removed.  Her diet was advanced as tolerated with great bowel function.  She was stable on HD 3 for DC home.  Physical Exam: Gen: NAD Abd: soft, NT, ND, +BS  Allergies as of 06/19/2022       Reactions   Morphine And Related Nausea Only        Medication List     TAKE these medications    acetaminophen 500 MG tablet Commonly known as: TYLENOL Take 1,000 mg by mouth every 6 (six) hours as needed for moderate pain.   ascorbic acid 500 MG tablet Commonly known as: VITAMIN C Take 500 mg by mouth every evening.   bisacodyl 5 MG EC tablet Commonly known as: DULCOLAX Take 1 tablet (5 mg total) by mouth daily as needed for moderate constipation.   cholecalciferol 1000 units tablet Commonly known as: VITAMIN D Take 1,000 Units by mouth every evening.   docusate sodium 100 MG capsule Commonly known as: COLACE Take 1 capsule (100 mg total) by mouth 2 (two) times daily.   gabapentin 600 MG tablet Commonly known as: NEURONTIN Take 600-1,200 mg by mouth See admin instructions. Take 600 mg by mouth in the morning and 1200 mg at night   hydrocortisone 2.5 % cream Apply 1 Application topically 2 (two) times daily as needed (itching).  hydrocortisone 2.5 % rectal cream Commonly known as: Proctozone-HC Place 1 application rectally 2 (two) times daily. What changed:  when to take this reasons to take this   ibuprofen 800 MG tablet Commonly known as: ADVIL TAKE 1 TABLET(800 MG) BY MOUTH EVERY 8 HOURS AS NEEDED What changed: See the new instructions.   lamoTRIgine 100 MG tablet Commonly known as: LAMICTAL Take 100 mg by mouth 2 (two) times daily.   methocarbamol 500  MG tablet Commonly known as: Robaxin Take 1 tablet (500 mg total) by mouth every 6 (six) hours as needed for muscle spasms.   methocarbamol 500 MG tablet Commonly known as: Robaxin Take 1 tablet (500 mg total) by mouth 4 (four) times daily.   Multivitamin Adult Tabs Take 1 tablet by mouth every evening.   oxyCODONE-acetaminophen 5-325 MG tablet Commonly known as: Percocet Take 1-2 tablets by mouth every 4 (four) hours as needed for severe pain.   polyethylene glycol 17 g packet Commonly known as: MiraLax Take 17 g by mouth daily.   pregabalin 50 MG capsule Commonly known as: Lyrica Take 1 capsule (50 mg total) by mouth 2 (two) times daily.   promethazine 25 MG suppository Commonly known as: Phenergan Place 1 suppository (25 mg total) rectally every 6 (six) hours as needed for nausea.   Restasis 0.05 % ophthalmic emulsion Generic drug: cycloSPORINE Place 1 drop into both eyes 2 (two) times daily.   simvastatin 20 MG tablet Commonly known as: ZOCOR TAKE 1 TABLET(20 MG) BY MOUTH DAILY   traZODone 50 MG tablet Commonly known as: DESYREL Take 50 mg by mouth at bedtime.   zinc gluconate 50 MG tablet Take 50 mg by mouth every evening.          Follow-up Information     Denita Lung, MD Follow up today.   Specialty: Family Medicine Why: As needed Contact information: [address] [place] [address] [phone]               <30 DC summary  Signed: Saverio Danker, [facility] Surgery 06/19/2022, 7:51 AM Please see Amion for pager number during day hours 7:00am-4:30pm, 7-11:30am on Weekends

## 2022-06-19 NOTE — Plan of Care (Signed)

## 2022-06-19 NOTE — Discharge Instructions (Signed)
Low fiber diet for several weeks to help after bowel obstruction

## 2022-06-22 ENCOUNTER — Telehealth: Payer: Self-pay

## 2022-06-22 NOTE — Telephone Encounter (Signed)
Transition Care Management Follow-up Telephone Call Date of discharge and from where: [facility] 06[facility] How have you been since you were released from the hospital? Good Any questions or concerns? No  Items Reviewed: Did the pt receive and understand the discharge instructions provided? Yes  Medications obtained and verified? Yes  Other? No  Any new allergies since your discharge? No  Dietary orders reviewed? Yes Do you have support at home? Yes   Home Care and Equipment/Supplies: Were home health services ordered? no  Functional Questionnaire: (I = Independent and D = Dependent) ADLs: I  Bathing/Dressing- I  Meal Prep- I  Eating- I  Maintaining continence- I  Transferring/Ambulation- I  Managing Meds- I  Follow up appointments reviewed:  PCP Hospital f/u appt confirmed? Yes No f/u needed per pt. She is feeling great.  [facility] f/u appt confirmed? Yes  Scheduled to see GI on 06/25/22 @ 9:30a.m. Are transportation arrangements needed? No  If their condition worsens, is the pt aware to call PCP or go to the Emergency Dept.? Yes Was the patient provided with contact information for the PCP's office or ED? Yes Was to pt encouraged to call back with questions or concerns? Yes

## 2022-06-25 ENCOUNTER — Encounter: Payer: Self-pay | Admitting: Gastroenterology

## 2022-06-25 ENCOUNTER — Ambulatory Visit: Payer: Federal, State, Local not specified - PPO | Admitting: Gastroenterology

## 2022-06-25 VITALS — BP 118/70 | HR 58 | Ht 65.0 in | Wt 112.1 lb

## 2022-06-25 DIAGNOSIS — K5904 Chronic idiopathic constipation: Secondary | ICD-10-CM

## 2022-06-25 DIAGNOSIS — K56609 Unspecified intestinal obstruction, unspecified as to partial versus complete obstruction: Secondary | ICD-10-CM

## 2022-06-25 DIAGNOSIS — Z86010 Personal history of colonic polyps: Secondary | ICD-10-CM

## 2022-06-25 DIAGNOSIS — K5903 Drug induced constipation: Secondary | ICD-10-CM

## 2022-06-25 MED ORDER — NA SULFATE-K SULFATE-MG SULF 17.5-3.13-1.6 GM/177ML PO SOLN: 1.0000 | Freq: Once | ORAL | 0 refills | Status: AC

## 2022-06-25 MED ORDER — NALOXEGOL OXALATE 12.5 MG PO TABS: 12.5000 mg | ORAL_TABLET | Freq: Every day | ORAL | 6 refills | Status: DC

## 2022-06-25 NOTE — Progress Notes (Signed)
Bethany [name]    [id number]    [birth year]  Primary Care Physician:Lalonde, Elyse Jarvis, MD  Referring Physician: Denita Lung, [place],   [id number]   Chief complaint:  Constipation, sbo, abdominal bloating  HPI: 66 year old very pleasant female here for follow-up visit for chronic idiopathic constipation and generalized abdominal bloating.  She has multiple episodes of small bowel obstruction secondary to adhesions, was recently hospitalized for SBO which improved with NG tube suction and bowel rest Her bowel habits remain irregular with alternating constipation and diarrhea. She doesn't have solid stool, most days its semiformed. She has excessive bloating and lower abdominal cramps when constipated. She is worried about recurrent bowel obstruction and adhesions.  No nausea or vomiting. No rectal bleeding.  She has lost some weight during the hospitalization and is struggling to keep her weight up.  She is also struggling with dietary changes and adjustments that she has to make  Colonoscopy 04/10/2016: 5 mm sessile polyp, melanosis, internal hemorrhoids   GI history: She was hospitalized after knee replacement surgery in June 2011 with constipation, abdominal pain and vomiting.  CT abd & pelvis showed moderate to high grade partial small bowel obstruction.  She had undergo exlap with lysis with adhesions and resection of distal ileum. Pathology chronic inflammation with adhesions, negative for dysplasia or malignancy.      Outpatient Encounter Medications as of 06/25/2022  Medication Sig   acetaminophen (TYLENOL) 500 MG tablet Take 1,000 mg by mouth every 6 (six) hours as needed for moderate pain.   bisacodyl (DULCOLAX) 5 MG EC tablet Take 1 tablet (5 mg total) by mouth daily as needed for moderate constipation.   cholecalciferol (VITAMIN D) 1000 UNITS tablet Take 1,000 Units by mouth every evening.   cycloSPORINE (RESTASIS) 0.05 %  ophthalmic emulsion Place 1 drop into both eyes 2 (two) times daily.   gabapentin (NEURONTIN) 600 MG tablet Take 600-1,200 mg by mouth See admin instructions. Take 600 mg by mouth in the morning and 1200 mg at night   hydrocortisone (PROCTOZONE-HC) 2.5 % rectal cream Place 1 application rectally 2 (two) times daily. (Patient taking differently: Place 1 application  rectally 2 (two) times daily as needed for hemorrhoids or anal itching.)   hydrocortisone 2.5 % cream Apply 1 Application topically 2 (two) times daily as needed (itching).   ibuprofen (ADVIL) 800 MG tablet TAKE 1 TABLET(800 MG) BY MOUTH EVERY 8 HOURS AS NEEDED (Patient taking differently: Take 800 mg by mouth every 8 (eight) hours as needed for moderate pain.)   lamoTRIgine (LAMICTAL) 100 MG tablet Take 100 mg by mouth 2 (two) times daily.    Multiple Vitamin (MULTIVITAMIN ADULT) TABS Take 1 tablet by mouth every evening.   polyethylene glycol (MIRALAX) 17 g packet Take 17 g by mouth daily.   simvastatin (ZOCOR) 20 MG tablet TAKE 1 TABLET(20 MG) BY MOUTH DAILY   traZODone (DESYREL) 50 MG tablet Take 50 mg by mouth at bedtime.   vitamin C (ASCORBIC ACID) 500 MG tablet Take 500 mg by mouth every evening.   zinc gluconate 50 MG tablet Take 50 mg by mouth every evening.   docusate sodium (COLACE) 100 MG capsule Take 1 capsule (100 mg total) by mouth 2 (two) times daily. (Patient not taking: Reported on 08/21/2021)   methocarbamol (ROBAXIN) 500 MG tablet Take 1 tablet (500 mg total) by mouth every 6 (six) hours as needed for muscle spasms. (Patient  not taking: Reported on 08/21/2021)   oxyCODONE-acetaminophen (PERCOCET) 5-325 MG tablet Take 1-2 tablets by mouth every 4 (four) hours as needed for severe pain. (Patient not taking: Reported on 12/11/2021)   pregabalin (LYRICA) 50 MG capsule Take 1 capsule (50 mg total) by mouth 2 (two) times daily. (Patient not taking: Reported on 12/11/2021)   promethazine (PHENERGAN) 25 MG suppository Place 1  suppository (25 mg total) rectally every 6 (six) hours as needed for nausea.   traMADol (ULTRAM) 50 MG tablet Take 1 tablet by mouth every 6 (six) hours as needed.   [DISCONTINUED] methocarbamol (ROBAXIN) 500 MG tablet Take 1 tablet (500 mg total) by mouth 4 (four) times daily. (Patient not taking: Reported on 12/11/2021)   No facility-administered encounter medications on file as of 06/25/2022.    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 All Paps normal afterwards   Degenerative disc disease, lumbar 05/02/2020   Dyslipidemia    Generalized anxiety disorder    Herpes genitalis    Herpes labialis    History of calcium pyrophosphate deposition disease (CPPD) 03/26/2020   History of opiate abuse, in remission    History of small bowel obstruction 04/17/2018   partial   Hyperlipidemia with target LDL less than 100 06/17/2011   IBS (irritable bowel syndrome)    Lumbago with sciatica, right side 02/10/2021   Major depressive disorder    Meibomian gland dysfunction (MGD) of both eyes 08/02/2020   Nonallopathic lesion of lumbosacral region 08/05/2017   Nonallopathic lesion of sacral region 08/05/2017   Nonallopathic lesion of thoracic region 08/05/2017   Osteopenia 04/2014   T score -1.7 FRAX 7.3%/0.8% stable from prior DEXA 2013   Raynaud's disease 06/17/2011   Recovering alcoholic in remission     S/P LASIK (laser assisted in situ keratomileusis) 08/02/2020   right eye   Spondylolisthesis of lumbar region 02/10/2021   Status post total right knee replacement 10/31/2019   Tubular  adenoma of colon 04/21/2016    Past Surgical History:  Procedure Laterality Date   AUGMENTATION MAMMAPLASTY     BREAST ENHANCEMENT SURGERY  2006   COSMETIC SURGERY     EYE SURGERY     lasik od   GYNECOLOGIC CRYOSURGERY  1990   LAPAROTOMY N/A 04/22/2018   Procedure: EXPLORATORY LAPAROTOMY;  Surgeon: Erroll Luna, MD;  Location: [place];  Service: General;  Laterality: N/A;   LIGAMENT REPAIR Right 09/24/2015   Procedure: RIGHT INDEX METACARPAL PHALANGEAL RADIAL COLLATERAL LIGAMENT REPAIR;  Surgeon: Leanora Cover, MD;  Location: [place];  Service: Orthopedics;  Laterality: Right;   MASS EXCISION N/A 08/04/2018   Procedure: REMOVAL OF SUTURE FROM ABDOMEN/ERAS PATHWAY;  Surgeon: Erroll Luna, MD;  Location: [place];  Service: General;  Laterality: N/A;   OOPHORECTOMY     BSO   Right knee arthoscopy Right 09/09/2017   [facility]   SHX 1 Left 02/19/2020   basel cell carcinoma   SHX1 Right 12/11/2020   basal cell carinoma,micronodular pattern   SKIN BIOPSY Right 02/28/2019   Superficial basal carcinoma    SKIN BIOPSY  Left 11/24/2021   superficial basal carcinoma of the left forearm   TONSILLECTOMY AND ADENOIDECTOMY     TOTAL KNEE ARTHROPLASTY Right 04/12/2018   TOTAL KNEE ARTHROPLASTY Right 04/12/2018   Procedure: RIGHT TOTAL KNEE ARTHROPLASTY;  Surgeon: Melrose Nakayama, MD;  Location: [place];  Service: Orthopedics;  Laterality: Right;   TRANSFORAMINAL LUMBAR INTERBODY FUSION W/ MIS 1 LEVEL N/A 09/03/2021   Procedure: Lumbar four-five Minimally invasive transforaminal lumbar interbody fusion;  Surgeon: Dawley, Theodoro Doing, DO;  Location: [place];  Service: Neurosurgery;  Laterality: N/A;   TUBAL LIGATION     UPPER GASTROINTESTINAL ENDOSCOPY  04/24/2005   VAGINAL HYSTERECTOMY  2005   TVH BSO  adenomyosis    Family History  Problem Relation Age of Onset   Hypertension Mother    Dementia Mother        rapid decline; concerns for Creutzfeldt-Jakob  disease   Lymphoma Father 76   Breast cancer Sister 43   Colon cancer Neg Hx     Social History   Socioeconomic History   Marital status: Married    Spouse name: Not on file   Number of children: Not on file   Years of education: 16   Highest education level: Bachelor's degree (e.g., BA, AB, BS)  Occupational History   Occupation: Retired    Comment: Herbalist  Tobacco Use   Smoking status: Former    Types: Cigarettes    Quit date: 03/21/2006    Years since quitting: 16.2   Smokeless tobacco: Never  Vaping Use   Vaping Use: Never used  Substance and Sexual Activity   Alcohol use: Not Currently    Comment: history of alcohol abuse/dependence. Sober for past 18 years   Drug use: Not Currently    Types: Oxycodone    Comment: history of opiate abuse. Sober for past 8 months   Sexual activity: Yes    Birth control/protection: Surgical    Comment: HYST-1st intercourse 66 yo-More than 5 partners  Other Topics Concern   Not on file  Social History Narrative   Not on file   Social Determinants of Health   Financial Resource Strain: Not on file  Food Insecurity: Not on file  Transportation Needs: Not on file  Physical Activity: Not on file  Stress: Not on file  Social Connections: Not on file  Intimate Partner Violence: Not on file      Review of systems: All other review of systems negative except as mentioned in the HPI.   Physical Exam: Vitals:   06/25/22 0920  BP: 118/70  Pulse: (!) 58   Body mass index is 18.66 kg/m. Gen:      No acute distress HEENT:  sclera anicteric Abd:      soft, non-tender; no palpable masses, no distension Ext:    No edema Neuro: alert and oriented x 3 Psych: normal mood and affect  Data Reviewed:  Reviewed labs, radiology imaging, old records and pertinent past GI work up   Assessment and Plan/Recommendations:  42 yr F with h/o SBO secondary to adhesions s/p ex lap with lysis of adhesions and distal ileum resection  04/2018 with recurrent SBO secondary to adhesions Generalized abdominal bloating chronic idiopathic constipation  Use Movantik 12.5 mg daily, based on response will increase to 25 mg daily as needed Advised patient to do bowel purge with MiraLAX  History of adenomatous colon polyps, due for surveillance colonoscopy we will schedule it The risks and benefits as well as alternatives of endoscopic  procedure(s) have been discussed and reviewed. All questions answered. The patient agrees to proceed.   Refer to nutrition for weight loss and for more guidance with the dietary changes due to history of small bowel obstruction Return in 3 months   The patient was provided an opportunity to ask questions and all were answered. The patient agreed with the plan and demonstrated an understanding of the instructions.  Damaris Hippo , MD    CC: Denita Lung, MD

## 2022-06-25 NOTE — Patient Instructions (Addendum)
If you are age 66 or older, your body mass index should be between 23-30. Your Body mass index is 18.66 kg/m. If this is out of the aforementioned range listed, please consider follow up with your Primary Care Provider. ________________________________________________________  The [facility] providers would like to encourage you to use [facility] to communicate with providers for non-urgent requests or questions.  Due to long hold times on the telephone, sending your provider a message by [facility] may be a faster and more efficient way to get a response.  Please allow 48 business hours for a response.  Please remember that this is for non-urgent requests.  _______________________________________________________  Dennis Bast have been scheduled for a colonoscopy. Please follow written instructions given to you at your visit today.  Please pick up your prep supplies at the pharmacy within the next 1-3 days. If you use inhalers (even only as needed), please bring them with you on the day of your procedure.  Start Movantik 12.5 mg 1 tablet every morning  We will be referring you to one of our [facility] Nutrition clinics  Dr Silverio Decamp recommends that you complete a bowel purge (to clean out your bowels). Please do the following: Purchase a bottle of Miralax over the counter as well as a box of 5 mg dulcolax tablets. Take 4 dulcolax tablets. Wait 1 hour. You will then drink 6-8 capfuls of Miralax mixed in an adequate amount of water/juice/gatorade (you may choose which of these liquids to drink) over the next 2-3 hours. You should expect results within 1 to 6 hours after completing the bowel purge.

## 2022-06-30 DIAGNOSIS — F411 Generalized anxiety disorder: Secondary | ICD-10-CM | POA: Diagnosis not present

## 2022-06-30 DIAGNOSIS — F1121 Opioid dependence, in remission: Secondary | ICD-10-CM | POA: Diagnosis not present

## 2022-06-30 DIAGNOSIS — F502 Bulimia nervosa: Secondary | ICD-10-CM | POA: Diagnosis not present

## 2022-07-01 ENCOUNTER — Telehealth: Payer: Self-pay

## 2022-07-01 ENCOUNTER — Encounter: Payer: Self-pay | Admitting: Gastroenterology

## 2022-07-01 ENCOUNTER — Ambulatory Visit (AMBULATORY_SURGERY_CENTER): Payer: Federal, State, Local not specified - PPO | Admitting: Gastroenterology

## 2022-07-01 ENCOUNTER — Telehealth: Payer: Self-pay | Admitting: Gastroenterology

## 2022-07-01 VITALS — BP 103/65 | HR 65 | Temp 97.3°F | Resp 13 | Ht 65.0 in | Wt 112.0 lb

## 2022-07-01 DIAGNOSIS — K5904 Chronic idiopathic constipation: Secondary | ICD-10-CM

## 2022-07-01 DIAGNOSIS — D123 Benign neoplasm of transverse colon: Secondary | ICD-10-CM

## 2022-07-01 DIAGNOSIS — K649 Unspecified hemorrhoids: Secondary | ICD-10-CM

## 2022-07-01 DIAGNOSIS — Z1211 Encounter for screening for malignant neoplasm of colon: Secondary | ICD-10-CM | POA: Diagnosis not present

## 2022-07-01 MED ORDER — SODIUM CHLORIDE 0.9 % IV SOLN: 500.0000 mL | Freq: Once | INTRAVENOUS | Status: DC

## 2022-07-01 NOTE — Progress Notes (Signed)
Pt. Feeling very tired and a bit dizzy.  Skin warm and dry, color normal.  Semi-fowlers position, until pt. Feeling ready to get dressed.  0479 pt. And care partner feel pt. Is ready to get dressed.  Pt. Feeling much more alert.

## 2022-07-01 NOTE — Op Note (Signed)
[facility] Patient Name: Bethany [name] Procedure Date: 07/01/2022 8:43 AM MRN: [id number] Endoscopist: Mauri Pole , MD Age: 66 Referring MD:  Date of Birth: 10/24/1956 Gender: Female Account #: [id number] Procedure:                Colonoscopy Indications:              High risk colon cancer surveillance: Personal                            history of colonic polyps, High risk colon cancer                            surveillance: Personal history of adenoma less than                            10 mm in size, Incidental change in bowel habits                            noted, Incidental constipation noted Medicines:                Monitored Anesthesia Care Procedure:                Pre-Anesthesia Assessment:                           - Prior to the procedure, a History and Physical                            was performed, and patient medications and                            allergies were reviewed. The patient's tolerance of                            previous anesthesia was also reviewed. The risks                            and benefits of the procedure and the sedation                            options and risks were discussed with the patient.                            All questions were answered, and informed consent                            was obtained. Prior Anticoagulants: The patient has                            taken no previous anticoagulant or antiplatelet                            agents. ASA Grade Assessment: II - A patient with  mild systemic disease. After reviewing the risks                            and benefits, the patient was deemed in                            satisfactory condition to undergo the procedure.                           After obtaining informed consent, the colonoscope                            was passed under direct vision. Throughout the                            procedure, the patient's  blood pressure, pulse, and                            oxygen saturations were monitored continuously. The                            Olympus PCF-[id number] (#[id number]) Colonoscope was                            introduced through the anus and advanced to the the                            cecum, identified by appendiceal orifice and                            ileocecal valve. The colonoscopy was performed                            without difficulty. The patient tolerated the                            procedure well. The quality of the bowel                            preparation was good. The ileocecal valve,                            appendiceal orifice, and rectum were photographed. Scope In: 8:56:55 AM Scope Out: 9:10:21 AM Scope Withdrawal Time: 0 hours 9 minutes 38 seconds  Total Procedure Duration: 0 hours 13 minutes 26 seconds  Findings:                 The perianal and digital rectal examinations were                            normal.                           A 5 mm polyp was found in the transverse colon. The  polyp was sessile. The polyp was removed with a                            cold snare. Resection and retrieval were complete.                           External and internal hemorrhoids were found during                            retroflexion. The hemorrhoids were medium-sized. Complications:            No immediate complications. Estimated Blood Loss:     Estimated blood loss was minimal. Impression:               - One 5 mm polyp in the transverse colon, removed                            with a cold snare. Resected and retrieved.                           - External and internal hemorrhoids. Recommendation:           - Patient has a contact number available for                            emergencies. The signs and symptoms of potential                            delayed complications were discussed with the                            patient.  Return to normal activities tomorrow.                            Written discharge instructions were provided to the                            patient.                           - Resume previous diet.                           - Continue present medications.                           - Await pathology results.                           - Repeat colonoscopy in 5-10 years for surveillance                            based on pathology results. Mauri Pole, MD 07/01/2022 9:15:58 AM This report has been signed electronically.

## 2022-07-01 NOTE — Telephone Encounter (Signed)
Patient Advocate Encounter   Received notification from [facility] that prior authorization is required for Movantik  Submitted: 07/01/2022 Key [id number]   Clista Bernhardt, CPhT Rx Patient Advocate Phone: [phone]

## 2022-07-01 NOTE — Progress Notes (Unsigned)
Please refer to office visit note 06/25/22. No additional changes in H&P Patient is appropriate for planned procedure(s) and anesthesia in an ambulatory setting  K. Denzil Magnuson , MD [phone]

## 2022-07-01 NOTE — Progress Notes (Unsigned)
PT taken to PACU. Monitors in place. VSS. Report given to RN. 

## 2022-07-01 NOTE — Progress Notes (Signed)
Called to room to assist during endoscopic procedure.  Patient ID and intended procedure confirmed with present staff. Received instructions for my participation in the procedure from the performing physician.  

## 2022-07-01 NOTE — Progress Notes (Unsigned)
VS completed by DT.  Pt's states no medical or surgical changes since previsit or office visit.  

## 2022-07-01 NOTE — Telephone Encounter (Signed)
Patient called, states her movantik 12.5 mg needs prior authorization. Please call to follow.

## 2022-07-01 NOTE — Patient Instructions (Signed)
Impression/Recommendations:  Polyp and hemorrhoid handouts given to patient.  Resume previous diet. Continue present medications. Await pathology results.  Repeat colonoscopy in 5-10 years for surveillance based on pathology results.  YOU HAD AN ENDOSCOPIC PROCEDURE TODAY AT [facility]:   Refer to the procedure report that was given to you for any specific questions about what was found during the examination.  If the procedure report does not answer your questions, please call your gastroenterologist to clarify.  If you requested that your care partner not be given the details of your procedure findings, then the procedure report has been included in a sealed envelope for you to review at your convenience later.  YOU SHOULD EXPECT: Some feelings of bloating in the abdomen. Passage of more gas than usual.  Walking can help get rid of the air that was put into your GI tract during the procedure and reduce the bloating. If you had a lower endoscopy (such as a colonoscopy or flexible sigmoidoscopy) you may notice spotting of blood in your stool or on the toilet paper. If you underwent a bowel prep for your procedure, you may not have a normal bowel movement for a few days.  Please Note:  You might notice some irritation and congestion in your nose or some drainage.  This is from the oxygen used during your procedure.  There is no need for concern and it should clear up in a day or so.  SYMPTOMS TO REPORT IMMEDIATELY:  Following lower endoscopy (colonoscopy or flexible sigmoidoscopy):  Excessive amounts of blood in the stool  Significant tenderness or worsening of abdominal pains  Swelling of the abdomen that is new, acute  Fever of 100F or higher  For urgent or emergent issues, a gastroenterologist can be reached at any hour by calling [phone]. Do not use MyChart messaging for urgent concerns.    DIET:  We do recommend a small meal at first, but then you may proceed  to your regular diet.  Drink plenty of fluids but you should avoid alcoholic beverages for 24 hours.  ACTIVITY:  You should plan to take it easy for the rest of today and you should NOT DRIVE or use heavy machinery until tomorrow (because of the sedation medicines used during the test).    FOLLOW UP: Our staff will call the number listed on your records the next business day following your procedure.  We will call around 7:15- 8:00 am to check on you and address any questions or concerns that you may have regarding the information given to you following your procedure. If we do not reach you, we will leave a message.  If you develop any symptoms (ie: fever, flu-like symptoms, shortness of breath, cough etc.) before then, please call [phone].  If you test positive for Covid 19 in the 2 weeks post procedure, please call and report this information to [place].    If any biopsies were taken you will be contacted by phone or by letter within the next 1-3 weeks.  Please call us at [phone] if you have not heard about the biopsies in 3 weeks.    SIGNATURES/CONFIDENTIALITY: You and/or your care partner have signed paperwork which will be entered into your electronic medical record.  These signatures attest to the fact that that the information above on your After Visit Summary has been reviewed and is understood.  Full responsibility of the confidentiality of this discharge information lies with you and/or your care-partner.

## 2022-07-02 ENCOUNTER — Encounter: Payer: Self-pay | Admitting: Gastroenterology

## 2022-07-02 ENCOUNTER — Other Ambulatory Visit (HOSPITAL_COMMUNITY): Payer: Self-pay

## 2022-07-02 ENCOUNTER — Telehealth: Payer: Self-pay

## 2022-07-02 NOTE — Telephone Encounter (Signed)
Sharyl informed and said a big Thank You!!!

## 2022-07-02 NOTE — Telephone Encounter (Signed)
I spoke with Dr Silverio Decamp and she said that [facility] doesn't have a blockage now. 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

## 2022-07-02 NOTE — Telephone Encounter (Signed)
Patient Advocate Encounter   Received notification from [employer] that prior authorization is required for Movantik. Initial PA denied for medical criteria, resubmitting with updated information from office. PA submitted and APPROVED on 07/02/2022.  Key [id number]  Effective: 06/02/2022 -  07/02/2023  Clista Bernhardt, CPhT Rx Patient Advocate Specialist Phone: [phone]

## 2022-07-02 NOTE — Telephone Encounter (Signed)
Patient Advocate Encounter  Received a fax from [place] regarding Prior Authorization for [place].   Authorization has been DENIED due to plan criteria not met.  "the use of this medication in a patient with gastrointestinal obstruction does not establish medical necessity for this drug"  Determination letter added to patient chart.  Clista Bernhardt, CPhT Rx Patient Advocate Phone: [phone]

## 2022-07-02 NOTE — Telephone Encounter (Signed)
Left message

## 2022-07-06 ENCOUNTER — Encounter: Payer: Self-pay | Admitting: Gastroenterology

## 2022-07-08 ENCOUNTER — Encounter: Payer: Self-pay | Admitting: Internal Medicine

## 2022-07-12 ENCOUNTER — Encounter: Payer: Self-pay | Admitting: Gastroenterology

## 2022-07-30 DIAGNOSIS — F1121 Opioid dependence, in remission: Secondary | ICD-10-CM | POA: Diagnosis not present

## 2022-07-30 DIAGNOSIS — F411 Generalized anxiety disorder: Secondary | ICD-10-CM | POA: Diagnosis not present

## 2022-07-30 DIAGNOSIS — F502 Bulimia nervosa: Secondary | ICD-10-CM | POA: Diagnosis not present

## 2022-08-11 ENCOUNTER — Encounter: Payer: Self-pay | Admitting: Internal Medicine

## 2022-08-18 ENCOUNTER — Other Ambulatory Visit: Payer: Federal, State, Local not specified - PPO

## 2022-08-19 DIAGNOSIS — L821 Other seborrheic keratosis: Secondary | ICD-10-CM | POA: Diagnosis not present

## 2022-08-19 DIAGNOSIS — L570 Actinic keratosis: Secondary | ICD-10-CM | POA: Diagnosis not present

## 2022-08-19 DIAGNOSIS — Z85828 Personal history of other malignant neoplasm of skin: Secondary | ICD-10-CM | POA: Diagnosis not present

## 2022-08-19 DIAGNOSIS — D225 Melanocytic nevi of trunk: Secondary | ICD-10-CM | POA: Diagnosis not present

## 2022-08-19 DIAGNOSIS — L812 Freckles: Secondary | ICD-10-CM | POA: Diagnosis not present

## 2022-08-20 ENCOUNTER — Other Ambulatory Visit (INDEPENDENT_AMBULATORY_CARE_PROVIDER_SITE_OTHER): Payer: Federal, State, Local not specified - PPO

## 2022-08-20 DIAGNOSIS — Z23 Encounter for immunization: Secondary | ICD-10-CM | POA: Diagnosis not present

## 2022-08-24 ENCOUNTER — Encounter: Payer: Self-pay | Admitting: Internal Medicine

## 2022-09-03 DIAGNOSIS — F1221 Cannabis dependence, in remission: Secondary | ICD-10-CM | POA: Diagnosis not present

## 2022-09-03 DIAGNOSIS — F1121 Opioid dependence, in remission: Secondary | ICD-10-CM | POA: Diagnosis not present

## 2022-09-03 DIAGNOSIS — F1021 Alcohol dependence, in remission: Secondary | ICD-10-CM | POA: Diagnosis not present

## 2022-09-03 DIAGNOSIS — F1421 Cocaine dependence, in remission: Secondary | ICD-10-CM | POA: Diagnosis not present

## 2022-09-21 DIAGNOSIS — M4316 Spondylolisthesis, lumbar region: Secondary | ICD-10-CM | POA: Diagnosis not present

## 2022-09-28 ENCOUNTER — Encounter: Payer: Self-pay | Admitting: Family Medicine

## 2022-09-28 ENCOUNTER — Telehealth (INDEPENDENT_AMBULATORY_CARE_PROVIDER_SITE_OTHER): Payer: Federal, State, Local not specified - PPO | Admitting: Family Medicine

## 2022-09-28 VITALS — Temp 98.7°F | Ht 65.0 in | Wt 113.0 lb

## 2022-09-28 DIAGNOSIS — B349 Viral infection, unspecified: Secondary | ICD-10-CM | POA: Diagnosis not present

## 2022-09-28 NOTE — Progress Notes (Signed)
Start time: 9:48 End time: 10:06  Virtual Visit via Video Note  I connected with Lestine Box on 09/28/22 by a video enabled telemedicine application and verified that I am speaking with the correct person using two identifiers.  Location: Patient: home Provider: office   I discussed the limitations of evaluation and management by telemedicine and the availability of in person appointments. The patient expressed understanding and agreed to proceed.  History of Present Illness:  Chief Complaint  Patient presents with   Cough    VIRTUAL cough, body aches that that started last Monday. She has chills and food doesn't taste good to her. All she wants to do is sleep. Did home covid test Friday and it was negative.    7 days ago she noted scratchy throat and mild cough. Symptoms progressively got worse. She has persistent cough, congestion, decreased appetite, food tastes bad.  She has a has frontal headache.  Myalgias persist, but not all the time. Had bad body aches last night. +chills. She denies any fevers, but has been taking 830m motrin, alternating with tylenol.  Nasal drainage is slightly yellow, sometimes bloody.  Seems to be getting a little lighter in color. Cough is nonproductive. Denies shortness of breath, chest congestion.  Negative COVID test on Friday. Exposed to grandkids with strep throat.    PMH, PSH, SH reviewed  Outpatient Encounter Medications as of 09/28/2022  Medication Sig Note   acetaminophen (TYLENOL) 500 MG tablet Take 1,000 mg by mouth every 6 (six) hours as needed for moderate pain. 09/28/2022: Last dose was last night   cholecalciferol (VITAMIN D) 1000 UNITS tablet Take 1,000 Units by mouth every evening.    cycloSPORINE (RESTASIS) 0.05 % ophthalmic emulsion Place 1 drop into both eyes 2 (two) times daily.    gabapentin (NEURONTIN) 600 MG tablet Take 600-1,200 mg by mouth See admin instructions. Take 600 mg by mouth in the morning and 1200 mg at  night    ibuprofen (ADVIL) 800 MG tablet TAKE 1 TABLET(800 MG) BY MOUTH EVERY 8 HOURS AS NEEDED (Patient taking differently: Take 800 mg by mouth every 8 (eight) hours as needed for moderate pain.) 09/28/2022: Took at 7:30am   lamoTRIgine (LAMICTAL) 100 MG tablet Take 100 mg by mouth 2 (two) times daily.     Multiple Vitamin (MULTIVITAMIN ADULT) TABS Take 1 tablet by mouth every evening.    naloxegol oxalate (MOVANTIK) 12.5 MG TABS tablet Take 1 tablet (12.5 mg total) by mouth daily.    polyethylene glycol (MIRALAX) 17 g packet Take 17 g by mouth daily. 09/28/2022: Took yesterday am   simvastatin (ZOCOR) 20 MG tablet TAKE 1 TABLET(20 MG) BY MOUTH DAILY    traZODone (DESYREL) 50 MG tablet Take 50 mg by mouth at bedtime.    vitamin C (ASCORBIC ACID) 500 MG tablet Take 500 mg by mouth every evening.    zinc gluconate 50 MG tablet Take 50 mg by mouth every evening.    bisacodyl (DULCOLAX) 5 MG EC tablet Take 1 tablet (5 mg total) by mouth daily as needed for moderate constipation. (Patient not taking: Reported on 09/28/2022) 09/28/2022: prn   hydrocortisone (PROCTOZONE-HC) 2.5 % rectal cream Place 1 application rectally 2 (two) times daily. (Patient not taking: Reported on 09/28/2022)    hydrocortisone 2.5 % cream Apply 1 Application topically 2 (two) times daily as needed (itching). (Patient not taking: Reported on 09/28/2022) 09/28/2022: prn   promethazine (PHENERGAN) 25 MG suppository Place 1 suppository (25 mg total) rectally  every 6 (six) hours as needed for nausea. (Patient not taking: Reported on 07/01/2022) 09/28/2022: prn   Facility-Administered Encounter Medications as of 09/28/2022  Medication   0.9 %  sodium chloride infusion   Allergies  Allergen Reactions   Morphine And Related Nausea Only   ROS: Mild stomach pain.  No v/d. No bleeding, bruising, rash. No chest pain, shortness of breath. +myalgias, fatigue and URI symptoms per HPI    Observations/Objective:  Temp 98.7 F (37.1  C) (Temporal)   Ht _0  (1.651 m)   Wt 113 lb (51.3 kg)   BMI 18.80 kg/m   Well-appearing female in no distress. Rare cough. Appears slightly tired. She is alert and oriented. Cranial nerves are grossly intact. Speaking easily and comfortably. Exam is limited due to the virtual nature of the visit  Assessment and Plan:  Viral syndrome - ddx reviewed--negative COVID test, too late to treat for flu. Supportive measures and s/sx bacterial infection reviewed, f/u if persists/worsens  Stay well hydrated. Use a decongestant (ie pseudoephedrine) to help with sinus pain. You can also try sinus rinses (Neti-pot or sinus rinse kit). I suggest taking Mucinex DM twice daily (12 hour version) to help thin out the mucus and help suppress your cough.  Try and back away from the ibuprofen and tylenol so that we can know if you are truly still having any fevers, which would be concerning, a week into the illness.  If you have persistent fevers, worsening discoloration of your mucus or phlegm, worsening cough, shortness of breath, please seek re-evaluation.   Follow Up Instructions:    I discussed the assessment and treatment plan with the patient. The patient was provided an opportunity to ask questions and all were answered. The patient agreed with the plan and demonstrated an understanding of the instructions.   The patient was advised to call back or seek an in-person evaluation if the symptoms worsen or if the condition fails to improve as anticipated.  I spent 21 minutes dedicated to the care of this patient, including pre-visit review of records, face to face time, post-visit ordering of testing and documentation.    Vikki Ports, MD

## 2022-09-28 NOTE — Patient Instructions (Signed)
Stay well hydrated. Use a decongestant (ie pseudoephedrine) to help with sinus pain. You can also try sinus rinses (Neti-pot or sinus rinse kit). I suggest taking Mucinex DM twice daily (12 hour version) to help thin out the mucus and help suppress your cough.  Try and back away from the ibuprofen and tylenol so that we can know if you are truly still having any fevers, which would be concerning, a week into the illness.  If you have persistent fevers, worsening discoloration of your mucus or phlegm, worsening cough, shortness of breath, please seek re-evaluation.

## 2022-09-30 DIAGNOSIS — H43813 Vitreous degeneration, bilateral: Secondary | ICD-10-CM | POA: Diagnosis not present

## 2022-09-30 DIAGNOSIS — H2511 Age-related nuclear cataract, right eye: Secondary | ICD-10-CM | POA: Diagnosis not present

## 2022-09-30 DIAGNOSIS — H16223 Keratoconjunctivitis sicca, not specified as Sjogren's, bilateral: Secondary | ICD-10-CM | POA: Diagnosis not present

## 2022-09-30 DIAGNOSIS — H25043 Posterior subcapsular polar age-related cataract, bilateral: Secondary | ICD-10-CM | POA: Diagnosis not present

## 2022-09-30 DIAGNOSIS — H2513 Age-related nuclear cataract, bilateral: Secondary | ICD-10-CM | POA: Diagnosis not present

## 2022-10-01 ENCOUNTER — Encounter: Payer: Self-pay | Admitting: Family Medicine

## 2022-10-01 ENCOUNTER — Ambulatory Visit: Payer: Federal, State, Local not specified - PPO | Admitting: Family Medicine

## 2022-10-01 VITALS — BP 102/62 | HR 68 | Temp 96.9°F | Ht 65.0 in | Wt 114.0 lb

## 2022-10-01 DIAGNOSIS — R059 Cough, unspecified: Secondary | ICD-10-CM | POA: Diagnosis not present

## 2022-10-01 DIAGNOSIS — J019 Acute sinusitis, unspecified: Secondary | ICD-10-CM | POA: Diagnosis not present

## 2022-10-01 MED ORDER — AZITHROMYCIN 250 MG PO TABS: ORAL_TABLET | ORAL | 0 refills | Status: DC

## 2022-10-01 MED ORDER — BENZONATATE 200 MG PO CAPS: 200.0000 mg | ORAL_CAPSULE | Freq: Three times a day (TID) | ORAL | 0 refills | Status: DC | PRN

## 2022-10-01 NOTE — Patient Instructions (Signed)
Take the Zpak as directed. Remember that you only take it for 5 days, but it continues to work for a total of 10 days. If you are zero better or worse by the time you take the last pill, contact us to change your prescription. If you are at least 1/2 way better, then be patient. Contact us on day 10 for a refill if your symptoms haven't completely resolved (sometimes people need longer than 10 days of antibiotic). If you develop more shortness of breath, worsening cough, please contact us so we can order an x-ray.  Stay well hydrated. Continue the sinus rinses. Consider using a nasal saline mist frequently as well.

## 2022-10-01 NOTE — Progress Notes (Signed)
Chief Complaint  Patient presents with   Cough    Still coughing, no better and still very dry-chest burns when she cough. Robitussin DM not helping. Taste is better. Her energy level is not good. No fever or body aches. Not taking sudafed, made her feel weird and she could not sleep. Neti-pot helps.    Patient had a virtual visit 2 days ago.  She is here today because she really wants her lungs listened to.  She states she is not feeling worse, certain symptoms have gotten a little better. She presents today because her cough is worrying her.  Still has poor energy. Frontal headache has resolved, Neti-pot is helping. Still getting yellow and some blood from the nose. Mucus is a little worse (was noted to be getting lighter on Monday), thicker/darker now. Smell and taste is improving. Body aches have resolved. No further chills, denies fever.  Last night she thought the cough was starting to break up a little--sounded looser, didn't expectorate anything.  Today it is very dry again. Denies any shortness of breath. Denies chest pain, pain with breathing. She reports feeling a little dizzy today--not lightheaded, more woozy/equilibrium.  She took motrin this morning. Mucinex DM hasn't been doing much for her. Didn't like the SE of sudafed, stopped taking it Cough is the same (not less or more), sometimes wakes her up at night.   PMH, PSH, SH reviewed  Outpatient Encounter Medications as of 10/01/2022  Medication Sig Note   cholecalciferol (VITAMIN D) 1000 UNITS tablet Take 1,000 Units by mouth every evening.    cycloSPORINE (RESTASIS) 0.05 % ophthalmic emulsion Place 1 drop into both eyes 2 (two) times daily.    gabapentin (NEURONTIN) 600 MG tablet Take 600-1,200 mg by mouth See admin instructions. Take 600 mg by mouth in the morning and 1200 mg at night    guaiFENesin-dextromethorphan (ROBITUSSIN DM) 100-10 MG/5ML syrup Take 15 mLs by mouth every 4 (four) hours as needed for cough.  10/01/2022: Last dose last night   ibuprofen (ADVIL) 800 MG tablet TAKE 1 TABLET(800 MG) BY MOUTH EVERY 8 HOURS AS NEEDED (Patient taking differently: Take 800 mg by mouth every 8 (eight) hours as needed for moderate pain.) 09/28/2022: Took at 7:30am   lamoTRIgine (LAMICTAL) 100 MG tablet Take 100 mg by mouth 2 (two) times daily.     Multiple Vitamin (MULTIVITAMIN ADULT) TABS Take 1 tablet by mouth every evening.    naloxegol oxalate (MOVANTIK) 12.5 MG TABS tablet Take 1 tablet (12.5 mg total) by mouth daily.    polyethylene glycol (MIRALAX) 17 g packet Take 17 g by mouth daily. 10/01/2022: Took this am   simvastatin (ZOCOR) 20 MG tablet TAKE 1 TABLET(20 MG) BY MOUTH DAILY    traZODone (DESYREL) 50 MG tablet Take 50 mg by mouth at bedtime.    vitamin C (ASCORBIC ACID) 500 MG tablet Take 500 mg by mouth every evening.    zinc gluconate 50 MG tablet Take 50 mg by mouth every evening.    acetaminophen (TYLENOL) 500 MG tablet Take 1,000 mg by mouth every 6 (six) hours as needed for moderate pain. (Patient not taking: Reported on 10/01/2022) 10/01/2022: prn   bisacodyl (DULCOLAX) 5 MG EC tablet Take 1 tablet (5 mg total) by mouth daily as needed for moderate constipation. (Patient not taking: Reported on 09/28/2022) 09/28/2022: prn   hydrocortisone (PROCTOZONE-HC) 2.5 % rectal cream Place 1 application rectally 2 (two) times daily. (Patient not taking: Reported on 09/28/2022)  hydrocortisone 2.5 % cream Apply 1 Application topically 2 (two) times daily as needed (itching). (Patient not taking: Reported on 09/28/2022) 09/28/2022: prn   promethazine (PHENERGAN) 25 MG suppository Place 1 suppository (25 mg total) rectally every 6 (six) hours as needed for nausea. (Patient not taking: Reported on 07/01/2022) 09/28/2022: prn   Facility-Administered Encounter Medications as of 10/01/2022  Medication   0.9 %  sodium chloride infusion   Allergies  Allergen Reactions   Morphine And Related Nausea Only    ROS:  no fever, chills. She reports some weight loss, slight dizziness/wooziness. Denies sore throat.  URI symptoms per HPI.  No n/v/d or headaches.   PHYSICAL EXAM:  BP 102/62   Pulse 68   Temp (!) 96.9 F (36.1 C) (Tympanic)   Ht '5\' 5"'$  (1.651 m)   Wt 114 lb (51.7 kg)   BMI 18.97 kg/m   Wt Readings from Last 3 Encounters:  10/01/22 114 lb (51.7 kg)  09/28/22 113 lb (51.3 kg)  07/01/22 112 lb (50.8 kg)    Well-appearing female, with intermittent dry cough, speaking easily, in no distress HEENT: conjunctiva and sclera are clear, EOMI.  L TM partially blocked by cerumen, but upper 1/4th appeared normal without erythema.  R TM and EAC normal. Nasal mucosa--mildly edematous with clear mucus on the left.  Moderately edematous, with erythema, purulent nasal drainage and some bleeding noted on the right. Sinuses nontender. OP is clear, no erythema or PND. Neck: no lymphadenopathy Heart: regular rate and rhythm, no murmur Lungs: trace squeak/ronchi which cleared after cough.  Good air movement, no wheeze or rales Skin: normal turgor, no rash Neuro: alert and oriented, cranial nerves intact, normal gait.    ASSESSMENT/PLAN:  Acute non-recurrent sinusitis, unspecified location - Plan: azithromycin (ZITHROMAX) 250 MG tablet  Cough, unspecified type - Plan: benzonatate (TESSALON) 200 MG capsule  Acute sinusitis, +/- bronchitis.  Take the Zpak as directed. Remember that you only take it for 5 days, but it continues to work for a total of 10 days. If you are zero better or worse by the time you take the last pill, contact us to change your prescription. If you are at least 1/2 way better, then be patient. Contact us on day 10 for a refill if your symptoms haven't completely resolved (sometimes people need longer than 10 days of antibiotic). If you develop more shortness of breath, worsening cough, please contact us so we can order an x-ray.  Stay well hydrated. Continue the sinus  rinses. Consider using a nasal saline mist frequently as well.  Continue the mucinex. Use the benzonatate three times daily if needed for cough. Use some lozenges during day.

## 2022-10-02 ENCOUNTER — Encounter: Payer: Self-pay | Admitting: Family Medicine

## 2022-10-06 DIAGNOSIS — F411 Generalized anxiety disorder: Secondary | ICD-10-CM | POA: Diagnosis not present

## 2022-10-08 DIAGNOSIS — M6281 Muscle weakness (generalized): Secondary | ICD-10-CM | POA: Diagnosis not present

## 2022-10-08 DIAGNOSIS — R2689 Other abnormalities of gait and mobility: Secondary | ICD-10-CM | POA: Diagnosis not present

## 2022-10-08 DIAGNOSIS — M545 Low back pain, unspecified: Secondary | ICD-10-CM | POA: Diagnosis not present

## 2022-10-08 DIAGNOSIS — M2560 Stiffness of unspecified joint, not elsewhere classified: Secondary | ICD-10-CM | POA: Diagnosis not present

## 2022-10-20 ENCOUNTER — Telehealth: Payer: Self-pay | Admitting: Internal Medicine

## 2022-10-20 NOTE — Telephone Encounter (Signed)
Called and left vm for pt that we can switch her to Dr. Tomi Bamberger. Advised that Dr. Tomi Bamberger does stay booked up but we could look for an appt for CPE for her for next year

## 2022-10-20 NOTE — Telephone Encounter (Signed)
Will wait on Dr. Tomi Bamberger response before calling patient

## 2022-10-20 NOTE — Telephone Encounter (Signed)
Spoke with patient about rescheduling an appointment with Dr. Redmond School next year for her CPE and she wanted to know if she could switch to you as a female. She said she saw you a couple weeks ago for a sick visit and really liked you. I did reschedule her with [facility] for cpe but she wanted me to ask. Sending to both providers

## 2022-10-28 DIAGNOSIS — Z1231 Encounter for screening mammogram for malignant neoplasm of breast: Secondary | ICD-10-CM | POA: Diagnosis not present

## 2022-10-28 LAB — HM MAMMOGRAPHY

## 2022-11-09 ENCOUNTER — Telehealth: Payer: Self-pay | Admitting: Internal Medicine

## 2022-11-09 NOTE — Telephone Encounter (Signed)
Pt coming in Wednesday.

## 2022-11-09 NOTE — Telephone Encounter (Signed)
Her last visit was 11/30, If she isn't better, she needs to schedule a follow-up visit. (She was advised 12/1 that if she wasn't significantly better after starting the ABX that she might need x-ray and change in ABX--obviously that was over a month ago and needs re-eval at this point)

## 2022-11-09 NOTE — Telephone Encounter (Signed)
Pt was wanting to know what needs to be done about her cough. She finished the zpak you gave her and she has no other symptoms.

## 2022-11-10 DIAGNOSIS — F411 Generalized anxiety disorder: Secondary | ICD-10-CM | POA: Diagnosis not present

## 2022-11-10 NOTE — Progress Notes (Unsigned)
No chief complaint on file.  Patient presents with complaint of cough.  She originally had a virtual visit on 11/27, with a week of respiratory symptoms, body aches. She was seen in-office 3 days later, as she really wanted her lungs listened to.  Her body aches and fever had resolved. Exam was consistent with sinusitis +/- bronchitis, and she was treated with a Z-pak.  We hadn't heard anything back from her since 12/1, until she called on Monday of this week reporting ongoing cough.   PMH, PSH, SH reviewed   ROS:  no fever, chills.  No n/v/d, bleeding, bruising, rash.   PHYSICAL EXAM:  There were no vitals taken for this visit.  Wt Readings from Last 3 Encounters:  10/01/22 114 lb (51.7 kg)  09/28/22 113 lb (51.3 kg)  07/01/22 112 lb (50.8 kg)      ASSESSMENT/PLAN:  CXR?

## 2022-11-11 ENCOUNTER — Encounter: Payer: Self-pay | Admitting: Family Medicine

## 2022-11-11 ENCOUNTER — Ambulatory Visit (INDEPENDENT_AMBULATORY_CARE_PROVIDER_SITE_OTHER): Payer: Medicare Other | Admitting: Family Medicine

## 2022-11-11 VITALS — BP 108/60 | HR 64 | Temp 96.9°F | Ht 65.0 in | Wt 116.2 lb

## 2022-11-11 DIAGNOSIS — H9313 Tinnitus, bilateral: Secondary | ICD-10-CM

## 2022-11-11 DIAGNOSIS — H9193 Unspecified hearing loss, bilateral: Secondary | ICD-10-CM | POA: Diagnosis not present

## 2022-11-11 DIAGNOSIS — R052 Subacute cough: Secondary | ICD-10-CM

## 2022-11-11 DIAGNOSIS — Z791 Long term (current) use of non-steroidal anti-inflammatories (NSAID): Secondary | ICD-10-CM

## 2022-11-11 NOTE — Patient Instructions (Signed)
Your cough is at least partly due to postnasal drainage.  There is inflammation and mucus in the nose, most likely from an allergy (since there is nothing to suggest a sinus infection at this point, and no new virus.) I recommend taking an antihistamine such as claritin or allegra or zyrtec. Take this once daily. You may need to use this longterm, if the cough recurs when you stop it. You can also consider taking Mucinex (plain or DM), which helps thin out the mucus that is draining, which can lessen the cough (the DM is an additional cough suppressant).  We put in an order for a chest x-ray.  You can go to [facility]) for a chest x-ray if your cough isn't improving (you can go today for peace of mind, vs waiting to see if the cough improves with the antihistamine).  We briefly discussed the potential risks of anti-inflammatories. Consider trying Extra Strength Tylenol or Tylenol Arthritis. If you need to continue the ibuprofen, consider taking a medication such as famotidine (pepcid) or Prilosec OTC in order to protect your stomach. Always take the ibuprofen with food, and stop if you get any upper abdominal pain. You can also try topical Voltaren gel in place of the ibuprofen (which won't affect your stomach).

## 2022-11-17 ENCOUNTER — Ambulatory Visit
Admission: RE | Admit: 2022-11-17 | Discharge: 2022-11-17 | Disposition: A | Discharge status: Home / Self Care | Payer: Medicare Other | Source: Ambulatory Visit | Attending: Family Medicine | Admitting: Family Medicine

## 2022-11-17 DIAGNOSIS — R052 Subacute cough: Secondary | ICD-10-CM

## 2022-11-17 DIAGNOSIS — R059 Cough, unspecified: Secondary | ICD-10-CM | POA: Diagnosis not present

## 2022-11-24 DIAGNOSIS — H269 Unspecified cataract: Secondary | ICD-10-CM | POA: Diagnosis not present

## 2022-11-24 DIAGNOSIS — H2511 Age-related nuclear cataract, right eye: Secondary | ICD-10-CM | POA: Diagnosis not present

## 2022-11-24 DIAGNOSIS — H2512 Age-related nuclear cataract, left eye: Secondary | ICD-10-CM | POA: Diagnosis not present

## 2022-11-26 ENCOUNTER — Ambulatory Visit: Payer: Medicare Other | Admitting: Audiology

## 2022-11-30 ENCOUNTER — Ambulatory Visit: Payer: Medicare Other | Attending: Audiology | Admitting: Audiology

## 2022-11-30 DIAGNOSIS — H903 Sensorineural hearing loss, bilateral: Secondary | ICD-10-CM | POA: Diagnosis not present

## 2022-11-30 DIAGNOSIS — H9313 Tinnitus, bilateral: Secondary | ICD-10-CM | POA: Diagnosis not present

## 2022-11-30 NOTE — Procedures (Signed)
  [facility], [place]  [id number] [phone]  AUDIOLOGICAL  EVALUATION  NAME: Bethany [name]     DOB:   [birth year]      MRN: [id number]                                                                                     DATE: 11/30/2022     REFERENT: Rita Ohara, MD STATUS: Outpatient DIAGNOSIS: Sensorineural hearing loss, tinnitus   History: Elin was seen for an audiological evaluation due to decreased hearing occurring for many years and tinnitus. Kanyah reports bilateral tinnitus, worse in the right ear. She reports the tinnitus has worsened in severity over the past few years and describes the tinnitus as constant, high-pitched, and bothersome. Samah denies otalgia, dizziness, and aural fullness. Kemia reports increased difficulty hearing the television.   Evaluation:  Otoscopy showed a clear view of the tympanic membranes, bilaterally Tympanometry results were consistent with normal middle ear pressure and normal tympanic membrane mobility (Type A), bilaterally.  Audiometric testing was completed using Conventional Audiometry techniques with insert earphones and TDH headphones. Test results are consistent with normal hearing sensitivity sloping to a moderate sensorineural hearing loss, bilaterally. Speech Recognition Thresholds were obtained at 20 dB HL in the right ear and at 20  dB HL in the left ear. Word Recognition Testing was completed at 70 dB HL and Jordynn scored 84% in the right ear and 100% in the left ear. Asymmetric word recognition noted, worse in the right ear.       Results:  The test results were reviewed with Myrtice. Today's test results are consistent with normal hearing sensitivity sloping to a moderate sensorineural hearing loss, bilaterally. Dulce will have hearing and communication difficulty in many listening environments. She will benefit from the use of hearing aids. Etoile was given a  handout and information about hearing aid centers in [place]. Corinne was counseled regarding her tinnitus and tinnitus management strategies. Shirlena was given handouts on tinnitus management strategies and recommendations.   Recommendations: 1.   Communication Needs Assessment with an Audiologist to discuss hearing aids especially for tinnitus management 2.   Use of tinnitus management strategies  3.   Use of TV Ears if hearing amplification is not pursed 4.   Monitor hearing sensitivity     45 minutes spent testing and counseling on results.   If you have any questions please feel free to contact me at [phone].  Bari Mantis Audiologist, Au.D., CCC-A 11/30/2022  5:26 PM  Cc: Rita Ohara, MD

## 2022-12-01 DIAGNOSIS — H269 Unspecified cataract: Secondary | ICD-10-CM | POA: Diagnosis not present

## 2022-12-01 DIAGNOSIS — H2512 Age-related nuclear cataract, left eye: Secondary | ICD-10-CM | POA: Diagnosis not present

## 2022-12-02 ENCOUNTER — Ambulatory Visit: Payer: Medicare Other | Admitting: Audiologist

## 2022-12-10 DIAGNOSIS — F411 Generalized anxiety disorder: Secondary | ICD-10-CM | POA: Diagnosis not present

## 2022-12-31 DIAGNOSIS — F411 Generalized anxiety disorder: Secondary | ICD-10-CM | POA: Diagnosis not present

## 2022-12-31 DIAGNOSIS — F1021 Alcohol dependence, in remission: Secondary | ICD-10-CM | POA: Diagnosis not present

## 2023-01-05 ENCOUNTER — Encounter: Payer: Self-pay | Admitting: Plastic Surgery

## 2023-01-05 ENCOUNTER — Ambulatory Visit (INDEPENDENT_AMBULATORY_CARE_PROVIDER_SITE_OTHER): Payer: Medicare Other | Admitting: Plastic Surgery

## 2023-01-05 VITALS — BP 147/99 | HR 65 | Ht 65.0 in | Wt 115.2 lb

## 2023-01-05 DIAGNOSIS — Z803 Family history of malignant neoplasm of breast: Secondary | ICD-10-CM | POA: Diagnosis not present

## 2023-01-05 DIAGNOSIS — F411 Generalized anxiety disorder: Secondary | ICD-10-CM

## 2023-01-05 DIAGNOSIS — N651 Disproportion of reconstructed breast: Secondary | ICD-10-CM

## 2023-01-05 DIAGNOSIS — Z8719 Personal history of other diseases of the digestive system: Secondary | ICD-10-CM

## 2023-01-05 DIAGNOSIS — F1110 Opioid abuse, uncomplicated: Secondary | ICD-10-CM

## 2023-01-05 DIAGNOSIS — Z719 Counseling, unspecified: Secondary | ICD-10-CM

## 2023-01-05 DIAGNOSIS — F17211 Nicotine dependence, cigarettes, in remission: Secondary | ICD-10-CM

## 2023-01-05 DIAGNOSIS — F332 Major depressive disorder, recurrent severe without psychotic features: Secondary | ICD-10-CM

## 2023-01-05 NOTE — Progress Notes (Signed)
Patient ID: Bethany [name], female    DOB: 1956-05-21, 67 y.o.   MRN: [id number]   Chief Complaint  Patient presents with   Advice Only   Breast Problem    The patient is a 67 year old female here for evaluation of her breasts.  The patient had implants placed in 1998.  She thinks they were silicone and above the muscle.  She had a capsular contracture on the left side and had implants replaced about 20 years ago.  She believes that they are now saline and under the muscle.  She went from an a cup to a C cup.  She has not smoked for several years.  Her family history is positive for breast cancer in her sister.  She does not have diabetes.  I will need to confirm her last mammogram.  She is a little asymmetric with a contracture of the left breast while the right breast implant hangs more inferiorly.  She is not sure of the size of the implants.  I am guessing around 300-400 cc. Her past medical history is positive for degenerative disc disease, history of small bowel obstruction, general anxiety and depressive disorder, narcotic bowel syndrome, recovering alcoholic and Raynaud's disease.  Her past surgical history includes spinal surgery, mass excision, laparoscopy, knee surgery, vaginal hysterectomy, T&A, eye surgery and cosmetic surgery.    Review of Systems  Constitutional: Negative.   HENT: Negative.    Eyes: Negative.   Respiratory: Negative.    Cardiovascular: Negative.   Gastrointestinal: Negative.   Endocrine: Negative.   Genitourinary: Negative.   Musculoskeletal: Negative.   Hematological: Negative.     Past Medical History:  Diagnosis Date   ADHD (attention deficit hyperactivity disorder) 09/13/2018   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 08/02/2020   Arthritis of carpometacarpal [facility]) joint of left thumb 01/18/2020   Injected January 18, 2020   Cervical dysplasia 1990   CIN 2 subsequent cryosurgery. All Paps normal afterwards   Degenerative disc  disease, lumbar 05/02/2020   Dyslipidemia    Generalized anxiety disorder    Herpes genitalis    Herpes labialis    History of calcium pyrophosphate deposition disease (CPPD) 03/26/2020   History of opiate abuse, in remission    History of small bowel obstruction 04/17/2018   partial   Hyperlipidemia with target LDL less than 100 06/17/2011   IBS (irritable bowel syndrome)    Lumbago with sciatica, right side 02/10/2021   Major depressive disorder    Meibomian gland dysfunction (MGD) of both eyes 08/02/2020   Narcotic bowel syndrome ([place])    Nonallopathic lesion of lumbosacral region 08/05/2017   Nonallopathic lesion of sacral region 08/05/2017   Nonallopathic lesion of thoracic region 08/05/2017   Osteopenia 04/2014   T score -1.7 FRAX 7.3%/0.8% stable from prior DEXA 2013   Raynaud's disease 06/17/2011   Recovering alcoholic in remission     S/P LASIK (laser assisted in situ keratomileusis) 08/02/2020   right eye   Spondylolisthesis of lumbar region 02/10/2021   Status post total right knee replacement 10/31/2019   Tubular adenoma of colon 04/21/2016    Past Surgical History:  Procedure Laterality Date   AUGMENTATION MAMMAPLASTY     BREAST ENHANCEMENT SURGERY  2006   COSMETIC SURGERY     EYE SURGERY     lasik od   GYNECOLOGIC CRYOSURGERY  1990   LAPAROTOMY N/A 04/22/2018   Procedure: EXPLORATORY LAPAROTOMY;  Surgeon: Erroll Luna, MD;  [facility]: [facility]  OR;  Service: General;  Laterality: N/A;   LIGAMENT REPAIR Right 09/24/2015   Procedure: RIGHT INDEX METACARPAL PHALANGEAL RADIAL COLLATERAL LIGAMENT REPAIR;  Surgeon: Leanora Cover, MD;  Location: [place];  Service: Orthopedics;  Laterality: Right;   MASS EXCISION N/A 08/04/2018   Procedure: REMOVAL OF SUTURE FROM ABDOMEN/ERAS PATHWAY;  Surgeon: Erroll Luna, MD;  Location: [place];  Service: General;  Laterality: N/A;   OOPHORECTOMY     BSO   Right knee arthoscopy Right 09/09/2017    [facility]   SHX 1 Left 02/19/2020   basel cell carcinoma   SHX1 Right 12/11/2020   basal cell carinoma,micronodular pattern   SKIN BIOPSY Right 02/28/2019   Superficial basal carcinoma    SKIN BIOPSY Left 11/24/2021   superficial basal carcinoma of the left forearm   TONSILLECTOMY AND ADENOIDECTOMY     TOTAL KNEE ARTHROPLASTY Right 04/12/2018   TOTAL KNEE ARTHROPLASTY Right 04/12/2018   Procedure: RIGHT TOTAL KNEE ARTHROPLASTY;  Surgeon: Melrose Nakayama, MD;  Location: [place];  Service: Orthopedics;  Laterality: Right;   TRANSFORAMINAL LUMBAR INTERBODY FUSION W/ MIS 1 LEVEL N/A 09/03/2021   Procedure: Lumbar four-five Minimally invasive transforaminal lumbar interbody fusion;  Surgeon: Dawley, Theodoro Doing, DO;  Location: [place];  Service: Neurosurgery;  Laterality: N/A;   TUBAL LIGATION     UPPER GASTROINTESTINAL ENDOSCOPY  04/24/2005   VAGINAL HYSTERECTOMY  2005   TVH BSO  adenomyosis      Current Outpatient Medications:    acetaminophen (TYLENOL) 500 MG tablet, Take 1,000 mg by mouth every 6 (six) hours as needed for moderate pain., Disp: , Rfl:    bisacodyl (DULCOLAX) 5 MG EC tablet, Take 1 tablet (5 mg total) by mouth daily as needed for moderate constipation., Disp: 15 tablet, Rfl: 0   cholecalciferol (VITAMIN D) 1000 UNITS tablet, Take 1,000 Units by mouth every evening., Disp: , Rfl:    cycloSPORINE (RESTASIS) 0.05 % ophthalmic emulsion, Place 1 drop into both eyes 2 (two) times daily., Disp: , Rfl:    gabapentin (NEURONTIN) 600 MG tablet, Take 600-1,200 mg by mouth See admin instructions. Take 600 mg by mouth in the morning and 1200 mg at night, Disp: , Rfl:    hydrocortisone 2.5 % cream, Apply 1 Application topically 2 (two) times daily as needed (itching)., Disp: , Rfl:    ibuprofen (ADVIL) 800 MG tablet, TAKE 1 TABLET(800 MG) BY MOUTH EVERY 8 HOURS AS NEEDED, Disp: 90 tablet, Rfl: 0   lamoTRIgine (LAMICTAL) 100 MG tablet, Take 100 mg by mouth 2 (two) times daily. , Disp: , Rfl:  0   Multiple Vitamin (MULTIVITAMIN ADULT) TABS, Take 1 tablet by mouth every evening., Disp: , Rfl:    naloxegol oxalate (MOVANTIK) 12.5 MG TABS tablet, Take 1 tablet (12.5 mg total) by mouth daily., Disp: 30 tablet, Rfl: 6   polyethylene glycol (MIRALAX) 17 g packet, Take 17 g by mouth daily., Disp: 14 each, Rfl: 0   simvastatin (ZOCOR) 20 MG tablet, TAKE 1 TABLET(20 MG) BY MOUTH DAILY, Disp: 90 tablet, Rfl: 3   traZODone (DESYREL) 50 MG tablet, Take 50 mg by mouth at bedtime., Disp: , Rfl:    vitamin C (ASCORBIC ACID) 500 MG tablet, Take 500 mg by mouth every evening., Disp: , Rfl:    zinc gluconate 50 MG tablet, Take 50 mg by mouth every evening., Disp: , Rfl:   Current Facility-Administered Medications:    0.9 %  sodium chloride infusion, 500 mL, Intravenous,  Once, Mauri Pole, MD   Objective:   Vitals:   01/05/23 0923  BP: (!) 147/99  Pulse: 65  SpO2: 97%    Physical Exam Vitals and nursing note reviewed.  Constitutional:      Appearance: Normal appearance.  HENT:     Head: Normocephalic and atraumatic.  Cardiovascular:     Rate and Rhythm: Normal rate.     Pulses: Normal pulses.  Pulmonary:     Effort: Pulmonary effort is normal.  Abdominal:     Palpations: Abdomen is soft.  Musculoskeletal:        General: No swelling, tenderness or deformity.  Skin:    General: Skin is warm.     Capillary Refill: Capillary refill takes less than 2 seconds.     Coloration: Skin is not jaundiced or pale.     Findings: No bruising or lesion.  Neurological:     Mental Status: She is alert and oriented to person, place, and time.  Psychiatric:        Mood and Affect: Mood normal.        Behavior: Behavior normal.        Thought Content: Thought content normal.        Judgment: Judgment normal.     Assessment & Plan:  Generalized anxiety disorder  History of small bowel obstruction  Severe episode of recurrent major depressive disorder, without psychotic features  ([place])  History of opiate abuse, in remission  Encounter for counseling  The patient would like to have the implants removed she is not sure if she wants new implants placed but is leaning that way.  I think that it is extremely risky because her chance of a capsular contracture is very high.  I am concerned that her skin is going to be extremely thin with little to no soft tissue to cover the implant.  If she does get implant she does want them to be smaller.  She would also like a mastopexy.  My recommendation is for removal without replacement and mastopexy.  I am going to send her to [employer] and send a quote.  I would like her to think about it and we can talk in about 2 weeks.  Pictures were obtained of the patient and placed in the chart with the patient's or guardian's permission.   [place], DO

## 2023-01-06 ENCOUNTER — Encounter: Payer: Self-pay | Admitting: *Deleted

## 2023-01-11 ENCOUNTER — Encounter: Payer: Self-pay | Admitting: *Deleted

## 2023-01-12 ENCOUNTER — Ambulatory Visit (INDEPENDENT_AMBULATORY_CARE_PROVIDER_SITE_OTHER): Payer: Medicare Other | Admitting: Plastic Surgery

## 2023-01-12 DIAGNOSIS — Z719 Counseling, unspecified: Secondary | ICD-10-CM

## 2023-01-12 NOTE — Progress Notes (Signed)
I called the patient and the patient answered but said that she had actually canceled appointment because she wants to wait and do an hour later time.  That is perfectly fine.  We are here when she is ready and she will call back when she wants to proceed.

## 2023-01-19 DIAGNOSIS — C44519 Basal cell carcinoma of skin of other part of trunk: Secondary | ICD-10-CM | POA: Diagnosis not present

## 2023-01-19 DIAGNOSIS — L570 Actinic keratosis: Secondary | ICD-10-CM | POA: Diagnosis not present

## 2023-01-19 DIAGNOSIS — B078 Other viral warts: Secondary | ICD-10-CM | POA: Diagnosis not present

## 2023-01-19 DIAGNOSIS — L820 Inflamed seborrheic keratosis: Secondary | ICD-10-CM | POA: Diagnosis not present

## 2023-01-25 DIAGNOSIS — C4491 Basal cell carcinoma of skin, unspecified: Secondary | ICD-10-CM | POA: Insufficient documentation

## 2023-01-31 ENCOUNTER — Other Ambulatory Visit: Payer: Self-pay | Admitting: Gastroenterology

## 2023-02-15 ENCOUNTER — Ambulatory Visit (INDEPENDENT_AMBULATORY_CARE_PROVIDER_SITE_OTHER): Payer: Medicare Other | Admitting: Family Medicine

## 2023-02-15 ENCOUNTER — Encounter: Payer: Self-pay | Admitting: Family Medicine

## 2023-02-15 VITALS — BP 118/72 | HR 64 | Temp 97.4°F | Ht 65.0 in | Wt 116.0 lb

## 2023-02-15 DIAGNOSIS — M7989 Other specified soft tissue disorders: Secondary | ICD-10-CM

## 2023-02-15 NOTE — Patient Instructions (Signed)
I do not think the lump on the arm is anything very serious at this point. Try some warm compresses. I would skip your biceps exercises for the next few weeks. If heat isn't helping, and if the swelling increases, you can try switching to ice.  Return next week for re-evaluation if worse.

## 2023-02-15 NOTE — Progress Notes (Signed)
Chief Complaint  Patient presents with   Mass    Lump on left bicep. Was lifting weights around noon and then noticed around 2pm. Not painful.    She was lifting weights at home today, 10# dumbbells.  3 reps of 30-35, as per her usual. Denies any pain (other than how it typically feels when doing this workout) or acute changes during the workout today. 2 hours later she noticed a lump overlying her left biceps.  There is no swelling, bruising, it isn't tender to touch.  It feels hard. There is no numbness, tingling or weakness of the LUE.  She hasn't tried any treatments yet.  She recalls a bad fall over Easter--L arm hit a gate--had significant bruising and pain. No weakness, no swelling. Lump not noticed until today--?if related.  She takes 800mg  motrin daily.  +easy bruising, unchanged.   PMH, PSH, SH reviewed  Outpatient Encounter Medications as of 02/15/2023  Medication Sig Note   bisacodyl (DULCOLAX) 5 MG EC tablet Take 1 tablet (5 mg total) by mouth daily as needed for moderate constipation. 02/15/2023: Took one last night   cholecalciferol (VITAMIN D) 1000 UNITS tablet Take 1,000 Units by mouth every evening.    cycloSPORINE (RESTASIS) 0.05 % ophthalmic emulsion Place 1 drop into both eyes 2 (two) times daily.    gabapentin (NEURONTIN) 600 MG tablet Take 600-1,200 mg by mouth See admin instructions. Take 600 mg by mouth in the morning and 1200 mg at night    ibuprofen (ADVIL) 800 MG tablet TAKE 1 TABLET(800 MG) BY MOUTH EVERY 8 HOURS AS NEEDED 02/15/2023: Takes one every am   lamoTRIgine (LAMICTAL) 100 MG tablet Take 100 mg by mouth 2 (two) times daily.     MOVANTIK 12.5 MG TABS tablet TAKE 1 TABLET(12.5MG ) BY MOUTH EVERY DAY    Multiple Vitamin (MULTIVITAMIN ADULT) TABS Take 1 tablet by mouth every evening.    polyethylene glycol (MIRALAX) 17 g packet Take 17 g by mouth daily. 02/15/2023: Takes daily   simvastatin (ZOCOR) 20 MG tablet TAKE 1 TABLET(20 MG) BY MOUTH DAILY     traZODone (DESYREL) 50 MG tablet Take 50 mg by mouth at bedtime.    vitamin C (ASCORBIC ACID) 500 MG tablet Take 500 mg by mouth every evening.    zinc gluconate 50 MG tablet Take 50 mg by mouth every evening.    acetaminophen (TYLENOL) 500 MG tablet Take 1,000 mg by mouth every 6 (six) hours as needed for moderate pain. (Patient not taking: Reported on 02/15/2023) 02/15/2023: prn   hydrocortisone 2.5 % cream Apply 1 Application topically 2 (two) times daily as needed (itching). (Patient not taking: Reported on 02/15/2023) 02/15/2023: As needed   Facility-Administered Encounter Medications as of 02/15/2023  Medication   0.9 %  sodium chloride infusion   Allergies  Allergen Reactions   Morphine And Related Nausea Only   ROS: no fever, chills, L arm pain, weakness, tingling.  +bruising resolving at L elbow from injury at [place].  +easy bruising in general.  No other complaints    PHYSICAL EXAM:  BP 118/72   Pulse 64   Temp (!) 97.4 F (36.3 C) (Tympanic)   Ht 5\' 5"  (1.651 m)   Wt 116 lb (52.6 kg)   BMI 19.30 kg/m   Pleasant, well-appearing female in no distress L upper arm-- 2.5 x 2 cm subcutaneous softi tissue mass is palpable overlying the left biceps (distal portion). There is a firm demarcation inferiorly (with horizontal line). No  pore There is no soft tissue swelling or ecchymosis It is nontender Not connected to skin or underlying muscle No popeye deformity with bicep testing. Slight yellow/resolving ecchymosis at medial epidoncyle (related to recent trauma). Purpura on shins   ASSESSMENT/PLAN:  Mass of soft tissue of left upper extremity  Soft tissue mass overlying L biceps. Unclear etiology--DDx reviewed. Does not appear to be a muscle tear, is separate from the muscle, and no bruising.  Possible hematoma related to recent injury. Possible cyst.  Will try warm compresses and monitor further,

## 2023-02-19 ENCOUNTER — Encounter: Payer: Self-pay | Admitting: Family Medicine

## 2023-02-23 DIAGNOSIS — F1021 Alcohol dependence, in remission: Secondary | ICD-10-CM | POA: Diagnosis not present

## 2023-02-24 NOTE — Progress Notes (Signed)
[name] [address] [place] [address] Phone: [phone] Subjective:   INadine Counts, am serving as a scribe for Dr. Antoine Primas.  I'm seeing this patient by the request  of:  Joselyn Arrow, MD  CC: Left shoulder pain, left arm bump  [id number]  Bethany [name] is a 67 y.o. female coming in with complaint of L bicep bump. Would like you tot take a look at lump on left bicep that appeared 2 weeks ago. No pain      Past Medical History:  Diagnosis Date   ADHD (attention deficit hyperactivity disorder) 09/13/2018   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 08/02/2020   Arthritis of carpometacarpal [facility]) joint of left thumb 01/18/2020   Injected January 18, 2020   Cervical dysplasia 1990   CIN 2 subsequent cryosurgery. All Paps normal afterwards   Degenerative disc disease, lumbar 05/02/2020   Dyslipidemia    Generalized anxiety disorder    Herpes genitalis    Herpes labialis    History of calcium pyrophosphate deposition disease (CPPD) 03/26/2020   History of opiate abuse, in remission    History of small bowel obstruction 04/17/2018   partial   Hyperlipidemia with target LDL less than 100 06/17/2011   IBS (irritable bowel syndrome)    Lumbago with sciatica, right side 02/10/2021   Major depressive disorder    Meibomian gland dysfunction (MGD) of both eyes 08/02/2020   Narcotic bowel syndrome ([facility])    Nonallopathic lesion of lumbosacral region 08/05/2017   Nonallopathic lesion of sacral region 08/05/2017   Nonallopathic lesion of thoracic region 08/05/2017   Osteopenia 04/2014   T score -1.7 FRAX 7.3%/0.8% stable from prior DEXA 2013   Raynaud's disease 06/17/2011   Recovering alcoholic in remission     S/P LASIK (laser assisted in situ keratomileusis) 08/02/2020   right eye   Spondylolisthesis of lumbar region 02/10/2021   Status post total right knee replacement 10/31/2019   Tubular adenoma of colon  04/21/2016   Past Surgical History:  Procedure Laterality Date   AUGMENTATION MAMMAPLASTY     BREAST ENHANCEMENT SURGERY  2006   COSMETIC SURGERY     EYE SURGERY     lasik od   GYNECOLOGIC CRYOSURGERY  1990   LAPAROTOMY N/A 04/22/2018   Procedure: EXPLORATORY LAPAROTOMY;  Surgeon: Harriette Bouillon, MD;  Location: [facility];  Service: General;  Laterality: N/A;   LIGAMENT REPAIR Right 09/24/2015   Procedure: RIGHT INDEX METACARPAL PHALANGEAL RADIAL COLLATERAL LIGAMENT REPAIR;  Surgeon: Betha Loa, MD;  Location: [facility];  Service: Orthopedics;  Laterality: Right;   MASS EXCISION N/A 08/04/2018   Procedure: REMOVAL OF SUTURE FROM ABDOMEN/ERAS PATHWAY;  Surgeon: Harriette Bouillon, MD;  Location: [facility];  Service: General;  Laterality: N/A;   OOPHORECTOMY     BSO   Right knee arthoscopy Right 09/09/2017   [facility]   SHX 1 Left 02/19/2020   basel cell carcinoma   SHX1 Right 12/11/2020   basal cell carinoma,micronodular pattern   SKIN BIOPSY Right 02/28/2019   Superficial basal carcinoma    SKIN BIOPSY Left 11/24/2021   superficial basal carcinoma of the left forearm   TONSILLECTOMY AND ADENOIDECTOMY     TOTAL KNEE ARTHROPLASTY Right 04/12/2018   TOTAL KNEE ARTHROPLASTY Right 04/12/2018   Procedure: RIGHT TOTAL KNEE ARTHROPLASTY;  Surgeon: Marcene Corning, MD;  Location[facility];  Service: Orthopedics;  Laterality: Right;   TRANSFORAMINAL LUMBAR INTERBODY FUSION  W/ MIS 1 LEVEL N/A 09/03/2021   Procedure: Lumbar four-five Minimally invasive transforaminal lumbar interbody fusion;  Surgeon: Dawley, Alan Mulder, DO;  Location: [facility];  Service: Neurosurgery;  Laterality: N/A;   TUBAL LIGATION     UPPER GASTROINTESTINAL ENDOSCOPY  04/24/2005   VAGINAL HYSTERECTOMY  2005   TVH BSO  adenomyosis   Social History   Socioeconomic History   Marital status: Married    Spouse name: Not on file   Number of children: Not on file   Years of education: 16    Highest education level: Bachelor's degree (e.g., BA, AB, BS)  Occupational History   Occupation: Retired    Comment: Librarian, academic  Tobacco Use   Smoking status: Former    Types: Cigarettes    Quit date: 03/21/2006    Years since quitting: 16.9   Smokeless tobacco: Never  Vaping Use   Vaping Use: Never used  Substance and Sexual Activity   Alcohol use: Not Currently    Comment: history of alcohol abuse/dependence. Sober for past 18 years   Drug use: Not Currently    Types: Oxycodone    Comment: history of opiate abuse. Sober for past 8 months   Sexual activity: Yes    Birth control/protection: Surgical    Comment: HYST-1st intercourse 67 yo-More than 5 partners  Other Topics Concern   Not on file  Social History Narrative   Not on file   Social Determinants of Health   Financial Resource Strain: Not on file  Food Insecurity: Not on file  Transportation Needs: Not on file  Physical Activity: Not on file  Stress: Not on file  Social Connections: Not on file   Allergies  Allergen Reactions   Morphine And Related Nausea Only   Family History  Problem Relation Age of Onset   Hypertension Mother    Dementia Mother        rapid decline; concerns for Creutzfeldt-Jakob disease   Lymphoma Father 93   Breast cancer Sister 37   Colon cancer Neg Hx       Current Outpatient Medications (Cardiovascular):    simvastatin (ZOCOR) 20 MG tablet, TAKE 1 TABLET(20 MG) BY MOUTH DAILY     Current Outpatient Medications (Analgesics):    acetaminophen (TYLENOL) 500 MG tablet, Take 1,000 mg by mouth every 6 (six) hours as needed for moderate pain. (Patient not taking: Reported on 02/15/2023)   ibuprofen (ADVIL) 800 MG tablet, TAKE 1 TABLET(800 MG) BY MOUTH EVERY 8 HOURS AS NEEDED     Current Outpatient Medications (Other):    bisacodyl (DULCOLAX) 5 MG EC tablet, Take 1 tablet (5 mg total) by mouth daily as needed for moderate constipation.   cholecalciferol (VITAMIN D) 1000  UNITS tablet, Take 1,000 Units by mouth every evening.   cycloSPORINE (RESTASIS) 0.05 % ophthalmic emulsion, Place 1 drop into both eyes 2 (two) times daily.   gabapentin (NEURONTIN) 600 MG tablet, Take 600-1,200 mg by mouth See admin instructions. Take 600 mg by mouth in the morning and 1200 mg at night   hydrocortisone 2.5 % cream, Apply 1 Application topically 2 (two) times daily as needed (itching). (Patient not taking: Reported on 02/15/2023)   lamoTRIgine (LAMICTAL) 100 MG tablet, Take 100 mg by mouth 2 (two) times daily.    MOVANTIK 12.5 MG TABS tablet, TAKE 1 TABLET(12.5MG ) BY MOUTH EVERY DAY   Multiple Vitamin (MULTIVITAMIN ADULT) TABS, Take 1 tablet by mouth every evening.   polyethylene glycol (MIRALAX) 17 g packet, Take  17 g by mouth daily.   traZODone (DESYREL) 50 MG tablet, Take 50 mg by mouth at bedtime.   vitamin C (ASCORBIC ACID) 500 MG tablet, Take 500 mg by mouth every evening.   zinc gluconate 50 MG tablet, Take 50 mg by mouth every evening.  Current Facility-Administered Medications (Other):    0.9 %  sodium chloride infusion   Reviewed prior external information including notes and imaging from  primary care provider As well as notes that were available from care everywhere and other healthcare systems.  Past medical history, social, surgical and family history all reviewed in electronic medical record.  No pertanent information unless stated regarding to the chief complaint.   Review of Systems:  No headache, visual changes, nausea, vomiting, diarrhea, constipation, dizziness, abdominal pain, skin rash, fevers, chills, night sweats, weight loss, swollen lymph nodes, body aches, joint swelling, chest pain, shortness of breath, mood changes. POSITIVE muscle aches  Objective  Blood pressure 122/78, pulse 70, height 5\' 5"  (1.651 m), weight 116 lb (52.6 kg).   General: No apparent distress alert and oriented x3 mood and affect normal, dressed appropriately.  HEENT:  Pupils equal, extraocular movements intact  Respiratory: Patient's speak in full sentences and does not appear short of breath  Cardiovascular: No lower extremity edema, non tender, no erythema  Left arm does have a superficial bump noted.  Seems to be freely movable.  Mild tender to palpation.  Does not appear to be attached to the underlying musculature.  Does have some positive impingement of the shoulder.  More impingement on the right side though.  Shoulder: Right Inspection reveals no abnormalities, atrophy or asymmetry. Palpation is normal with no tenderness over AC joint or bicipital groove. ROM is full in all planes passively. Rotator cuff strength normal throughout. signs of impingement with positive Neer and Hawkin's tests, but negative empty can sign. Speeds and Yergason's tests normal. No labral pathology noted with negative Obrien's, negative clunk and good stability. Normal scapular function observed. No painful arc and no drop arm sign. No apprehension sign  MSK US performed of: Right This study was ordered, performed, and interpreted by Terrilee Files D.O.  Shoulder:   Supraspinatus:  Appears normal on long and transverse views, Bursal bulge seen with shoulder abduction on impingement view. Biceps Tendon:  Appears normal on long and transverse views, no fraying of tendon, tendon located in intertubercular groove, no subluxation with shoulder internal or external rotation. Left arm though seems to be more secondary to a superficial mass noted.  Does seem to have more of a hypoechoic change that is consistent with a seroma superficial to the bicep muscle mild overlying lobe lipoma  Impression: Subacromial bursitis right shoulder and  seroma  of the left arm.  Procedure: Real-time Ultrasound Guided Injection of right glenohumeral joint Device: GE Logiq E  Ultrasound guided injection is preferred based studies that show increased duration, increased effect, greater accuracy,  decreased procedural pain, increased response rate with ultrasound guided versus blind injection.  Verbal informed consent obtained.  Time-out conducted.  Noted no overlying erythema, induration, or other signs of local infection.  Skin prepped in a sterile fashion.  Local anesthesia: Topical Ethyl chloride.  With sterile technique and under real time ultrasound guidance:  Joint visualized.  23g 1  inch needle inserted posterior approach. Pictures taken for needle placement. Patient did have injection of , 2 cc of 0.5% Marcaine, and 1.0 cc of Kenalog 40 mg/dL. Completed without difficulty  Pain immediately resolved  suggesting accurate placement of the medication.  Advised to call if fevers/chills, erythema, induration, drainage, or persistent bleeding.  Impression: Technically successful ultrasound guided injection.  Osteopathic findings  T9 extended rotated and side bent left L2 flexed rotated and side bent right Sacrum right on right    Impression and Recommendations:     The above documentation has been reviewed and is accurate and complete Judi Saa, DO

## 2023-02-25 ENCOUNTER — Other Ambulatory Visit: Payer: Self-pay

## 2023-02-25 ENCOUNTER — Ambulatory Visit (INDEPENDENT_AMBULATORY_CARE_PROVIDER_SITE_OTHER): Payer: Medicare Other | Admitting: Family Medicine

## 2023-02-25 VITALS — BP 122/78 | HR 70 | Ht 65.0 in | Wt 116.0 lb

## 2023-02-25 DIAGNOSIS — T792XXA Traumatic secondary and recurrent hemorrhage and seroma, initial encounter: Secondary | ICD-10-CM | POA: Diagnosis not present

## 2023-02-25 DIAGNOSIS — M9902 Segmental and somatic dysfunction of thoracic region: Secondary | ICD-10-CM

## 2023-02-25 DIAGNOSIS — M4316 Spondylolisthesis, lumbar region: Secondary | ICD-10-CM | POA: Diagnosis not present

## 2023-02-25 DIAGNOSIS — M9904 Segmental and somatic dysfunction of sacral region: Secondary | ICD-10-CM

## 2023-02-25 DIAGNOSIS — M7551 Bursitis of right shoulder: Secondary | ICD-10-CM

## 2023-02-25 DIAGNOSIS — M5441 Lumbago with sciatica, right side: Secondary | ICD-10-CM

## 2023-02-25 DIAGNOSIS — M999 Biomechanical lesion, unspecified: Secondary | ICD-10-CM | POA: Diagnosis not present

## 2023-02-25 DIAGNOSIS — M9903 Segmental and somatic dysfunction of lumbar region: Secondary | ICD-10-CM | POA: Diagnosis not present

## 2023-02-25 DIAGNOSIS — M25511 Pain in right shoulder: Secondary | ICD-10-CM

## 2023-02-25 DIAGNOSIS — G8929 Other chronic pain: Secondary | ICD-10-CM

## 2023-02-25 DIAGNOSIS — M25522 Pain in left elbow: Secondary | ICD-10-CM

## 2023-02-25 NOTE — Patient Instructions (Addendum)
Injection in shoulder today Compression sleeve when being active See you again in 5 weeks

## 2023-02-26 DIAGNOSIS — M7551 Bursitis of right shoulder: Secondary | ICD-10-CM | POA: Insufficient documentation

## 2023-02-26 DIAGNOSIS — T792XXA Traumatic secondary and recurrent hemorrhage and seroma, initial encounter: Secondary | ICD-10-CM | POA: Insufficient documentation

## 2023-02-26 NOTE — Assessment & Plan Note (Signed)
Degenerative disc disease.  Responded extremely well to osteopathic manipulation.  Discussed posture and ergonomics.  Discussed core strengthening exercises.  Patient is always aware of her back and some of her arthritic changes and pain.

## 2023-02-26 NOTE — Assessment & Plan Note (Addendum)
Injection given today and tolerated the procedure well, discussed icing regimen and home exercises, discussed posture and ergonomics as well.  We discussed home exercises that I think will be helpful.  Does seem to be acute in no significant acute tear of the rotator cuff noted.  Follow-up again in 6 to 8 weeks also referred to PT for the shoulder and the back has had good response to dry needling previously

## 2023-02-26 NOTE — Assessment & Plan Note (Addendum)
It is appears that patient has more of a seroma of the arm.  Did have an incidence where patient did have where enough pressure would have been going from a inferior to superior distance that could cause the separation differential includes a muscle herniation but it seems to be more of a hypoechoic infiltrate on ultrasound.  Freely movable and tender.  No abnormal blood flow surrounding the area.  We discussed this with patient about different treatment options.  If not bugging her she continue to monitor it we will do compression at the moment and monitor.  Follow-up again in 4 weeks and if worsening will consider aspiration

## 2023-02-26 NOTE — Assessment & Plan Note (Signed)
Known spondylolisthesis.  Discussed posture and ergonomics otherwise.  Increase activity slowly.  Continue to work on core strengthening.  Avoid repetitive flexion and extension.

## 2023-02-26 NOTE — Assessment & Plan Note (Signed)

## 2023-03-01 DIAGNOSIS — H43392 Other vitreous opacities, left eye: Secondary | ICD-10-CM | POA: Diagnosis not present

## 2023-03-01 DIAGNOSIS — H26491 Other secondary cataract, right eye: Secondary | ICD-10-CM | POA: Diagnosis not present

## 2023-03-01 DIAGNOSIS — H16223 Keratoconjunctivitis sicca, not specified as Sjogren's, bilateral: Secondary | ICD-10-CM | POA: Diagnosis not present

## 2023-03-01 DIAGNOSIS — H43813 Vitreous degeneration, bilateral: Secondary | ICD-10-CM | POA: Diagnosis not present

## 2023-03-03 ENCOUNTER — Encounter: Payer: Self-pay | Admitting: Family Medicine

## 2023-03-08 DIAGNOSIS — Z961 Presence of intraocular lens: Secondary | ICD-10-CM | POA: Diagnosis not present

## 2023-03-09 ENCOUNTER — Ambulatory Visit: Payer: Medicare Other | Admitting: Family Medicine

## 2023-03-10 ENCOUNTER — Other Ambulatory Visit: Payer: Self-pay | Admitting: Family Medicine

## 2023-03-11 NOTE — Telephone Encounter (Signed)
Is this okay to refill? 

## 2023-03-11 NOTE — Telephone Encounter (Signed)
Patient advised and she will contact [facility] as this is for back pain and shoulder pain.

## 2023-03-11 NOTE — Telephone Encounter (Signed)
I'm not sure what this is being used for, last rx'd a year ago. She has been seeing [facility]--if getting actively treated by them for a problem (I believe they discussed shoulder pain), might make more sense to come from them. Regardless, #[phone] as requested by pharmacy is not appropriate.

## 2023-03-22 DIAGNOSIS — H26492 Other secondary cataract, left eye: Secondary | ICD-10-CM | POA: Diagnosis not present

## 2023-03-23 ENCOUNTER — Ambulatory Visit: Payer: Medicare Other | Attending: Family Medicine

## 2023-03-23 DIAGNOSIS — M5459 Other low back pain: Secondary | ICD-10-CM | POA: Insufficient documentation

## 2023-03-23 DIAGNOSIS — M6281 Muscle weakness (generalized): Secondary | ICD-10-CM | POA: Diagnosis not present

## 2023-03-23 DIAGNOSIS — M25511 Pain in right shoulder: Secondary | ICD-10-CM | POA: Diagnosis not present

## 2023-03-23 DIAGNOSIS — R2689 Other abnormalities of gait and mobility: Secondary | ICD-10-CM | POA: Diagnosis not present

## 2023-03-23 NOTE — Therapy (Signed)
OUTPATIENT PHYSICAL THERAPY SHOULDER EVALUATION   Patient Name: Bethany [name] MRN: [id number] DOB:1956/03/23, 67 y.o., female Today's Date: 03/24/2023  END OF SESSION:  PT End of Session - 03/24/23 1209     Visit Number 1    Number of Visits 17    Date for PT Re-Evaluation 05/19/23    Authorization Type [employer] MCR    PT Start Time 1617    PT Stop Time 1700    PT Time Calculation (min) 43 min    Activity Tolerance Patient tolerated treatment well    Behavior During Therapy WFL for tasks assessed/performed             Past Medical History:  Diagnosis Date   ADHD (attention deficit hyperactivity disorder) 09/13/2018   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 All Paps normal afterwards   Degenerative disc disease, lumbar 05/02/2020   Dyslipidemia    Generalized anxiety disorder    Herpes genitalis    Herpes labialis    History of calcium pyrophosphate deposition disease (CPPD) 03/26/2020   History of opiate abuse, in remission    History of small bowel obstruction 04/17/2018   partial   Hyperlipidemia with target LDL less than 100 06/17/2011   IBS (irritable bowel syndrome)    Lumbago with sciatica, right side 02/10/2021   Major depressive disorder    Meibomian gland dysfunction (MGD) of both eyes 08/02/2020   Narcotic bowel syndrome (HCC)    Nonallopathic lesion of lumbosacral region 08/05/2017   Nonallopathic lesion of sacral region 08/05/2017   Nonallopathic lesion of thoracic region 08/05/2017   Osteopenia 04/2014   T score -1.7 FRAX 7.3%/0.8% stable from prior DEXA 2013   Raynaud's disease 06/17/2011   Recovering alcoholic in remission     S/P LASIK (laser assisted in situ keratomileusis) 08/02/2020   right eye   Spondylolisthesis of lumbar region 02/10/2021   Status post total  right knee replacement 10/31/2019   Tubular adenoma of colon 04/21/2016   Past Surgical History:  Procedure Laterality Date   AUGMENTATION MAMMAPLASTY     BREAST ENHANCEMENT SURGERY  2006   COSMETIC SURGERY     EYE SURGERY     lasik od   GYNECOLOGIC CRYOSURGERY  1990   LAPAROTOMY N/A 04/22/2018   Procedure: EXPLORATORY LAPAROTOMY;  Surgeon: Harriette Bouillon, MD;  Location: [facility] OR;  Service: General;  Laterality: N/A;   LIGAMENT REPAIR Right 09/24/2015   Procedure: RIGHT INDEX METACARPAL PHALANGEAL RADIAL COLLATERAL LIGAMENT REPAIR;  Surgeon: Betha Loa, MD;  Location: [facility];  Service: Orthopedics;  Laterality: Right;   MASS EXCISION N/A 08/04/2018   Procedure: REMOVAL OF SUTURE FROM ABDOMEN/ERAS PATHWAY;  Surgeon: Harriette Bouillon, MD;  Location: [facility];  Service: General;  Laterality: N/A;   OOPHORECTOMY     BSO   Right knee arthoscopy Right 09/09/2017   [facility]   SHX 1 Left 02/19/2020   basel cell carcinoma   SHX1 Right 12/11/2020   basal cell carinoma,micronodular pattern   SKIN BIOPSY Right 02/28/2019   Superficial basal carcinoma    SKIN BIOPSY Left 11/24/2021   superficial basal carcinoma of the left forearm   TONSILLECTOMY AND ADENOIDECTOMY     TOTAL KNEE ARTHROPLASTY Right 04/12/2018   TOTAL KNEE ARTHROPLASTY Right 04/12/2018   Procedure: RIGHT TOTAL KNEE ARTHROPLASTY;  Surgeon: Marcene Corning, MD;  Location: [facility] OR;  Service: Orthopedics;  Laterality: Right;   TRANSFORAMINAL LUMBAR INTERBODY FUSION W/ MIS 1 LEVEL N/A 09/03/2021   Procedure: Lumbar four-five Minimally invasive transforaminal lumbar interbody fusion;  Surgeon: Dawley, Alan Mulder, DO;  Location: [facility] OR;  Service: Neurosurgery;  Laterality: N/A;   TUBAL LIGATION     UPPER GASTROINTESTINAL ENDOSCOPY  04/24/2005   VAGINAL HYSTERECTOMY  2005   [facility] BSO  adenomyosis   Patient Active Problem List   Diagnosis Date Noted   Seroma due to trauma (HCC) 02/26/2023   Acute  bursitis of right shoulder 02/26/2023   Basal cell carcinoma 01/25/2023   Encounter for counseling 01/05/2023   Small bowel obstruction (HCC) 06/16/2022   History of hysterectomy 04/23/2022   Squamous cell carcinoma in situ (SCCIS) of skin of left thigh 08/27/2021   History of opiate abuse, in remission    Spondylolisthesis of lumbar region 02/10/2021   Lumbago with sciatica, right side 02/10/2021   Recovering alcoholic in remission     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 08/02/2020   Meibomian gland dysfunction (MGD) of both eyes 08/02/2020   S/P LASIK (laser assisted in situ keratomileusis) 08/02/2020   Degenerative disc disease, lumbar 05/02/2020   History of calcium pyrophosphate deposition disease (CPPD) 03/26/2020   Arthritis of carpometacarpal (CMC) joint of left thumb 01/18/2020   Status post total right knee replacement 10/31/2019   ADHD (attention deficit hyperactivity disorder) 09/13/2018   IBS (irritable bowel syndrome) 04/17/2018   History of small bowel obstruction 04/17/2018   Nonallopathic lesion of thoracic region 08/05/2017   Nonallopathic lesion of lumbosacral region 08/05/2017   Nonallopathic lesion of sacral region 08/05/2017   Tubular adenoma of colon 04/21/2016   Hyperlipidemia with target LDL less than 100 06/17/2011   Raynaud's disease 06/17/2011    PCP:  Joselyn Arrow, MD  REFERRING PROVIDER: Judi Saa, DO   REFERRING DIAG: [phone] (ICD-10-CM) - Right shoulder pain, unspecified chronicity   THERAPY DIAG:  Right shoulder pain, unspecified chronicity  Muscle weakness (generalized)  Other low back pain  Other abnormalities of gait and mobility  Rationale for Evaluation and Treatment: Rehabilitation  ONSET DATE: Chronic  SUBJECTIVE:                                                                                                                                                                                      SUBJECTIVE  STATEMENT: Pt presents to PT with reports of chronic R shoulder and lower back pain. Of note, pt states that she has had no shoulder pain and has been doing well after injection to R shoulder a  few weeks ago. Has continued LBP, especially and R sided and feels like these muscles are in spasm after playing pickle ball and other desired recreation. Has hx of spinal fusion, denies radiating symptoms down LE. Also had a R TKA a few years ago with complicated course and incomplete rehab secondary to an SBO. Has greatly reduced mobility in R knee, with manipulations and knee scope not helping to improve this.   Hand dominance: Right  PERTINENT HISTORY: Lumbar fusion, R TKA, SBO  PAIN:  Are you having pain?  Yes: NPRS scale: 2/10 Worst: 6/10 Pain location: lower back Pain description: R lower back Aggravating factors: pickle ball, jogging Relieving factors: rest, medication, heat  PRECAUTIONS: None  WEIGHT BEARING RESTRICTIONS: No  FALLS:  Has patient fallen in last 6 months? No  LIVING ENVIRONMENT: Lives with: lives with their family Lives in: House/apartment Stairs: No Has following equipment at home: None  OCCUPATION: Retired  PLOF: Independent  PATIENT GOALS: pt wants to improve her lower back discomfort in order to play pickle ball and exercise with decreased pain  OBJECTIVE:   DIAGNOSTIC FINDINGS:  See imaging  PATIENT SURVEYS:  FOTO: 67% function; 67% predicted  COGNITION: Overall cognitive status: Within functional limits for tasks assessed     SENSATION: WFL  POSTURE: Rounded shoulders, fwd head, reduced lumbar lordosis   UPPER EXTREMITY ROM:   Active ROM Right eval Left eval  Shoulder flexion WNL WNL  Shoulder extension    Shoulder abduction WNL WNL  Shoulder adduction    Shoulder internal rotation    Shoulder external rotation    Elbow flexion    Elbow extension    Wrist flexion    Wrist extension    Wrist ulnar deviation    Wrist radial  deviation    Wrist pronation    Wrist supination    (Blank rows = not tested)  UPPER EXTREMITY MMT:  MMT Right eval Left eval  Shoulder flexion WNL WNL  Shoulder extension    Shoulder abduction WNL WNL  Shoulder adduction    Shoulder internal rotation WNL WNL  Shoulder external rotation WNL WNL  Middle trapezius    Lower trapezius    Elbow flexion    Elbow extension    Wrist flexion    Wrist extension    Wrist ulnar deviation    Wrist radial deviation    Wrist pronation    Wrist supination    Grip strength (lbs)    (Blank rows = not tested)  LOWER EXTREMITY ROM:   Active ROM Right eval Left eval  Shoulder flexion 90 WNL  Shoulder extension 2 WFL  (Blank rows = not tested)  SHOULDER SPECIAL TESTS: Impingement tests: DNT SLAP lesions: DNT Instability tests: DNT Rotator cuff assessment: DNT Biceps assessment: DNT  FUNCTIONAL TESTS:  30 Second Sit to Stand: 10 reps  PALPATION:  Significant TTP to lumbar paraspinals, R>L; no overt TTP to R shoulder    TREATMENT: [facility] Adult PT Treatment:                                                DATE: 03/24/2023 Therapeutic Exercise: DKTC x 60" Supine PPT x 5 - 5" hold LTR x 5 each Manual Therapy: STM to lumbar paraspinals in prone   PATIENT EDUCATION: Education details: eval findings, FOTO, HEP, POC Person educated: Patient Education method:  Explanation, Demonstration, and Handouts Education comprehension: verbalized understanding and returned demonstration  HOME EXERCISE PROGRAM: Access Code: [email or web address] URL: [email or web address] Date: 03/23/2023 Prepared by: Edwinna Areola  Exercises - Supine Double Knee to Chest  - 7 x weekly - 3 sets - 60 sec hold - Supine Posterior Pelvic Tilt  - 1 x daily - 7 x weekly - 2 sets - 10 reps - 5 sec hold - Supine Lower Trunk Rotation  - 1 x daily - 7 x weekly - 2 sets - 10 reps - 5 sec hold  ASSESSMENT:  CLINICAL IMPRESSION: Patient is a 67 y.o. F who was  seen today for physical therapy evaluation and treatment for chronic shoulder and LBP. On exam, pt with no shoulder pain or noted deficits since injection. Does has significant LBP and painful lumbar paraspinals, with reduced lumbar mobility and core strength secondary to hx of spinal fusion. Her R knee also has severe limitation in flexion due to complications post R TKA, which is potentially contributing to her R sided lower back pain and muscle spasm. She would benefit from skilled PT working on improving core strength and soft tissue extensibility in order to decrease pain and improve function.    OBJECTIVE IMPAIRMENTS: Abnormal gait, decreased balance, decreased cognition, decreased endurance, decreased mobility, difficulty walking, decreased ROM, decreased strength, postural dysfunction, and pain.   ACTIVITY LIMITATIONS: carrying, lifting, bending, standing, squatting, stairs, and transfers  PARTICIPATION LIMITATIONS: community activity, yard work, and recreational activity  PERSONAL FACTORS: Time since onset of injury/illness/exacerbation and 3+ comorbidities: Lumbar fusion, R TKA, SBO  are also affecting patient's functional outcome.   REHAB POTENTIAL: Good  CLINICAL DECISION MAKING: Stable/uncomplicated  EVALUATION COMPLEXITY: Low   GOALS: Goals reviewed with patient? No  SHORT TERM GOALS: Target date: 04/13/2023   Pt will be compliant and knowledgeable with initial HEP for improved comfort and carryover Baseline: initial HEP given  Goal status: INITIAL  2.  Pt will self report back pain no greater than 4/10 for improved comfort and functional ability Baseline: 6/10 at worst Goal status: INITIAL   LONG TERM GOALS: Target date: 05/18/2023   Pt will improve FOTO function score to no less than 75% as proxy for functional improvement Baseline: 67% function Goal status: INITIAL  2.  Pt will self report back pain no greater than 1-2/10 for improved comfort and functional  ability Baseline: 6/10 at worst Goal status: INITIAL   3.  Pt will increase 30 Second Sit to Stand rep count to no less than 12 reps for improved balance, strength, and functional mobility Baseline: 10 reps  Goal status: INITIAL   4.  Pt will improve R knee flexion to no less than 100 degrees for improved mobility and decreased stress to lower back Baseline: see ROM chart Goal status: INITIAL   PLAN:  PT FREQUENCY: 1-2x/week  PT DURATION: 8 weeks  PLANNED INTERVENTIONS: Therapeutic exercises, Therapeutic activity, Neuromuscular re-education, Balance training, Gait training, Patient/Family education, Self Care, Joint mobilization, Aquatic Therapy, Dry Needling, Cryotherapy, Moist heat, Vasopneumatic device, Manual therapy, and Re-evaluation  PLAN FOR NEXT SESSION: assess HEP response, core strengthening, TPDN and massage    Eloy End, PT 03/24/2023, 12:27 PM

## 2023-03-24 ENCOUNTER — Other Ambulatory Visit: Payer: Self-pay

## 2023-03-25 ENCOUNTER — Other Ambulatory Visit: Payer: Self-pay

## 2023-03-25 DIAGNOSIS — M5136 Other intervertebral disc degeneration, lumbar region: Secondary | ICD-10-CM

## 2023-03-31 ENCOUNTER — Telehealth: Payer: Self-pay

## 2023-03-31 ENCOUNTER — Ambulatory Visit: Payer: Medicare Other

## 2023-03-31 DIAGNOSIS — M5459 Other low back pain: Secondary | ICD-10-CM

## 2023-03-31 DIAGNOSIS — R2689 Other abnormalities of gait and mobility: Secondary | ICD-10-CM

## 2023-03-31 DIAGNOSIS — M25511 Pain in right shoulder: Secondary | ICD-10-CM

## 2023-03-31 DIAGNOSIS — M6281 Muscle weakness (generalized): Secondary | ICD-10-CM | POA: Diagnosis not present

## 2023-03-31 NOTE — Telephone Encounter (Signed)
Called to offer an opening with Bethany [name]. We have openings today and tomorrow.

## 2023-03-31 NOTE — Therapy (Signed)
OUTPATIENT PHYSICAL THERAPY TREATMENT   Patient Name: Bethany [name] MRN: [id number] DOB:[birth year], 67 y.o., female Today's Date: 04/01/2023  END OF SESSION:  PT End of Session - 03/31/23 1614     Visit Number 2    Number of Visits 17    Date for PT Re-Evaluation 05/19/23    Authorization Type [employer] MCR    PT Start Time 1615    PT Stop Time 1655    PT Time Calculation (min) 40 min    Activity Tolerance Patient tolerated treatment well    Behavior During Therapy WFL for tasks assessed/performed              Past Medical History:  Diagnosis Date   ADHD (attention deficit hyperactivity disorder) 09/13/2018   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 All Paps normal afterwards   Degenerative disc disease, lumbar 05/02/2020   Dyslipidemia    Generalized anxiety disorder    Herpes genitalis    Herpes labialis    History of calcium pyrophosphate deposition disease (CPPD) 03/26/2020   History of opiate abuse, in remission    History of small bowel obstruction 04/17/2018   partial   Hyperlipidemia with target LDL less than 100 06/17/2011   IBS (irritable bowel syndrome)    Lumbago with sciatica, right side 02/10/2021   Major depressive disorder    Meibomian gland dysfunction (MGD) of both eyes 08/02/2020   Narcotic bowel syndrome (HCC)    Nonallopathic lesion of lumbosacral region 08/05/2017   Nonallopathic lesion of sacral region 08/05/2017   Nonallopathic lesion of thoracic region 08/05/2017   Osteopenia 04/2014   T score -1.7 FRAX 7.3%/0.8% stable from prior DEXA 2013   Raynaud's disease 06/17/2011   Recovering alcoholic in remission     S/P LASIK (laser assisted in situ keratomileusis) 08/02/2020   right eye   Spondylolisthesis of lumbar region 02/10/2021   Status post total right knee  replacement 10/31/2019   Tubular adenoma of colon 04/21/2016   Past Surgical History:  Procedure Laterality Date   AUGMENTATION MAMMAPLASTY     BREAST ENHANCEMENT SURGERY  2006   COSMETIC SURGERY     EYE SURGERY     lasik od   GYNECOLOGIC CRYOSURGERY  1990   LAPAROTOMY N/A 04/22/2018   Procedure: EXPLORATORY LAPAROTOMY;  Surgeon: Harriette Bouillon, MD;  Location: [facility] OR;  Service: General;  Laterality: N/A;   LIGAMENT REPAIR Right 09/24/2015   Procedure: RIGHT INDEX METACARPAL PHALANGEAL RADIAL COLLATERAL LIGAMENT REPAIR;  Surgeon: Betha Loa, MD;  Location: [facility];  Service: Orthopedics;  Laterality: Right;   MASS EXCISION N/A 08/04/2018   Procedure: REMOVAL OF SUTURE FROM ABDOMEN/ERAS PATHWAY;  Surgeon: Harriette Bouillon, MD;  Location: [facility];  Service: General;  Laterality: N/A;   OOPHORECTOMY     BSO   Right knee arthoscopy Right 09/09/2017   [facility]   SHX 1 Left 02/19/2020   basel cell carcinoma   SHX1 Right 12/11/2020   basal cell carinoma,micronodular pattern   SKIN BIOPSY Right 02/28/2019   Superficial basal carcinoma    SKIN BIOPSY Left 11/24/2021   superficial basal carcinoma of the left forearm   TONSILLECTOMY AND ADENOIDECTOMY     TOTAL KNEE ARTHROPLASTY Right 04/12/2018   TOTAL KNEE ARTHROPLASTY Right 04/12/2018   Procedure: RIGHT TOTAL KNEE ARTHROPLASTY;  Surgeon: Marcene Corning, MD;  Location: [facility] OR;  Service: Orthopedics;  Laterality: Right;   TRANSFORAMINAL LUMBAR INTERBODY FUSION W/ MIS 1 LEVEL N/A 09/03/2021   Procedure: Lumbar four-five Minimally invasive transforaminal lumbar interbody fusion;  Surgeon: Dawley, Alan Mulder, DO;  Location: [facility] OR;  Service: Neurosurgery;  Laterality: N/A;   TUBAL LIGATION     UPPER GASTROINTESTINAL ENDOSCOPY  04/24/2005   VAGINAL HYSTERECTOMY  2005   [facility] BSO  adenomyosis   Patient Active Problem List   Diagnosis Date Noted   Seroma due to trauma (HCC) 02/26/2023   Acute bursitis of  right shoulder 02/26/2023   Basal cell carcinoma 01/25/2023   Encounter for counseling 01/05/2023   Small bowel obstruction (HCC) 06/16/2022   History of hysterectomy 04/23/2022   Squamous cell carcinoma in situ (SCCIS) of skin of left thigh 08/27/2021   History of opiate abuse, in remission    Spondylolisthesis of lumbar region 02/10/2021   Lumbago with sciatica, right side 02/10/2021   Recovering alcoholic in remission     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 08/02/2020   Meibomian gland dysfunction (MGD) of both eyes 08/02/2020   S/P LASIK (laser assisted in situ keratomileusis) 08/02/2020   Degenerative disc disease, lumbar 05/02/2020   History of calcium pyrophosphate deposition disease (CPPD) 03/26/2020   Arthritis of carpometacarpal (CMC) joint of left thumb 01/18/2020   Status post total right knee replacement 10/31/2019   ADHD (attention deficit hyperactivity disorder) 09/13/2018   IBS (irritable bowel syndrome) 04/17/2018   History of small bowel obstruction 04/17/2018   Nonallopathic lesion of thoracic region 08/05/2017   Nonallopathic lesion of lumbosacral region 08/05/2017   Nonallopathic lesion of sacral region 08/05/2017   Tubular adenoma of colon 04/21/2016   Hyperlipidemia with target LDL less than 100 06/17/2011   Raynaud's disease 06/17/2011    PCP:  Joselyn Arrow, MD  REFERRING PROVIDER: Judi Saa, DO   REFERRING DIAG: [id number] (ICD-10-CM) - Right shoulder pain, unspecified chronicity   THERAPY DIAG:  Right shoulder pain, unspecified chronicity  Muscle weakness (generalized)  Other low back pain  Other abnormalities of gait and mobility  Rationale for Evaluation and Treatment: Rehabilitation  ONSET DATE: Chronic  SUBJECTIVE:                                                                                                                                                                                      SUBJECTIVE  STATEMENT: Pt presents to PT with reports of decreased pain in lower back. Has been compliant with HEP with no adverse effect.   Hand dominance: Right  PERTINENT HISTORY: Lumbar fusion, R TKA, SBO  PAIN:  Are you having pain?  Yes: NPRS scale: 2/10 Worst: 6/10 Pain location: lower back Pain description: R lower back Aggravating factors: pickle ball, jogging Relieving factors: rest, medication, heat  PRECAUTIONS: None  WEIGHT BEARING RESTRICTIONS: No  FALLS:  Has patient fallen in last 6 months? No  LIVING ENVIRONMENT: Lives with: lives with their family Lives in: House/apartment Stairs: No Has following equipment at home: None  OCCUPATION: Retired  PLOF: Independent  PATIENT GOALS: pt wants to improve her lower back discomfort in order to play pickle ball and exercise with decreased pain  OBJECTIVE:   DIAGNOSTIC FINDINGS:  See imaging  PATIENT SURVEYS:  FOTO: 67% function; 67% predicted  COGNITION: Overall cognitive status: Within functional limits for tasks assessed     SENSATION: WFL  POSTURE: Rounded shoulders, fwd head, reduced lumbar lordosis   UPPER EXTREMITY ROM:   Active ROM Right eval Left eval  Shoulder flexion WNL WNL  Shoulder extension    Shoulder abduction WNL WNL  Shoulder adduction    Shoulder internal rotation    Shoulder external rotation    Elbow flexion    Elbow extension    Wrist flexion    Wrist extension    Wrist ulnar deviation    Wrist radial deviation    Wrist pronation    Wrist supination    (Blank rows = not tested)  UPPER EXTREMITY MMT:  MMT Right eval Left eval  Shoulder flexion WNL WNL  Shoulder extension    Shoulder abduction WNL WNL  Shoulder adduction    Shoulder internal rotation WNL WNL  Shoulder external rotation WNL WNL  Middle trapezius    Lower trapezius    Elbow flexion    Elbow extension    Wrist flexion    Wrist extension    Wrist ulnar deviation    Wrist radial deviation    Wrist  pronation    Wrist supination    Grip strength (lbs)    (Blank rows = not tested)  LOWER EXTREMITY ROM:   Active ROM Right eval Left eval  Shoulder flexion 90 WNL  Shoulder extension 2 WFL  (Blank rows = not tested)  SHOULDER SPECIAL TESTS: Impingement tests: DNT SLAP lesions: DNT Instability tests: DNT Rotator cuff assessment: DNT Biceps assessment: DNT  FUNCTIONAL TESTS:  30 Second Sit to Stand: 10 reps  PALPATION:  Significant TTP to lumbar paraspinals, R>L; no overt TTP to R shoulder    TREATMENT: [facility] Adult PT Treatment:                                                DATE: 03/31/2023 Therapeutic Exercise: NuStep lvl 5 x 5 min while taking subjective Slant board calf stretch 2x30" Supine PPT x 10 - 5" hold Supine PPT with march 2x20 Supine heel slide x 10 - 5" hold Rec bike x 3 min - working on improving R knee flex Manual Therapy: Skilled palpation of trigger points for TPDN STM to lumbar paraspinals in prone  Trigger Point Dry Needling Treatment: Pre-treatment instruction: Patient instructed on dry needling rationale, procedures, and possible side effects including pain during treatment (achy,cramping feeling), bruising, drop of blood, lightheadedness, nausea, sweating. Patient Consent Given: Yes Education handout provided: No Muscles treated: R lumbar paraspinals  Needle size and number: .30x37mm x 2 Electrical stimulation performed: No Parameters: N/A Treatment response/outcome: Twitch response elicited and  Palpable decrease in muscle tension Post-treatment instructions: Patient instructed to expect possible mild to moderate muscle soreness later today and/or tomorrow. Patient instructed in methods to reduce muscle soreness and to continue prescribed HEP. If patient was dry needled over the lung field, patient was instructed on signs and symptoms of pneumothorax and, however unlikely, to see immediate medical attention should they occur. Patient was also  educated on signs and symptoms of infection and to seek medical attention should they occur. Patient verbalized understanding of these instructions and education.  [facility] Adult PT Treatment:                                                DATE: 03/24/2023 Therapeutic Exercise: DKTC x 60" Supine PPT x 5 - 5" hold LTR x 5 each Manual Therapy: STM to lumbar paraspinals in prone   PATIENT EDUCATION: Education details: continue HEP Person educated: Patient Education method: Explanation, Demonstration, and Handouts Education comprehension: verbalized understanding and returned demonstration  HOME EXERCISE PROGRAM: Access Code: [email or web address] URL: [email or web address] Date: 03/23/2023 Prepared by: Edwinna Areola  Exercises - Supine Double Knee to Chest  - 7 x weekly - 3 sets - 60 sec hold - Supine Posterior Pelvic Tilt  - 1 x daily - 7 x weekly - 2 sets - 10 reps - 5 sec hold - Supine Lower Trunk Rota tion  - 1 x daily - 7 x weekly - 2 sets - 10 reps - 5 sec hold  ASSESSMENT:  CLINICAL IMPRESSION: Pt was able to complete all prescribed exercises with no adverse effect. Therapy focused on improving core strength and R knee mobility to decrease LBP. She responded well to TPDN and manual therapy, noting decrease in pain post session. Pt continues to benefit from skilled PT, will continue per POC   OBJECTIVE IMPAIRMENTS: Abnormal gait, decreased balance, decreased cognition, decreased endurance, decreased mobility, difficulty walking, decreased ROM, decreased strength, postural dysfunction, and pain.   ACTIVITY LIMITATIONS: carrying, lifting, bending, standing, squatting, stairs, and transfers  PARTICIPATION LIMITATIONS: community activity, yard work, and recreational activity  PERSONAL FACTORS: Time since onset of injury/illness/exacerbation and 3+ comorbidities: Lumbar fusion, R TKA, SBO  are also affecting patient's functional outcome.   REHAB POTENTIAL: Good  CLINICAL DECISION  MAKING: Stable/uncomplicated  EVALUATION COMPLEXITY: Low   GOALS: Goals reviewed with patient? No  SHORT TERM GOALS: Target date: 04/13/2023   Pt will be compliant and knowledgeable with initial HEP for improved comfort and carryover Baseline: initial HEP given  Goal status: INITIAL  2.  Pt will self report back pain no greater than 4/10 for improved comfort and functional ability Baseline: 6/10 at worst Goal status: INITIAL   LONG TERM GOALS: Target date: 05/18/2023   Pt will improve FOTO function score to no less than 75% as proxy for functional improvement Baseline: 67% function Goal status: INITIAL  2.  Pt will self report back pain no greater than 1-2/10 for improved comfort and functional ability Baseline: 6/10 at worst Goal status: INITIAL   3.  Pt will increase 30 Second Sit to Stand rep count to no less than 12 reps for improved balance, strength, and functional mobility Baseline: 10 reps  Goal status: INITIAL   4.  Pt will improve R knee flexion to no less than 100 degrees for improved mobility and decreased stress to lower back Baseline: see ROM  chart Goal status: INITIAL   PLAN:  PT FREQUENCY: 1-2x/week  PT DURATION: 8 weeks  PLANNED INTERVENTIONS: Therapeutic exercises, Therapeutic activity, Neuromuscular re-education, Balance training, Gait training, Patient/Family education, Self Care, Joint mobilization, Aquatic Therapy, Dry Needling, Cryotherapy, Moist heat, Vasopneumatic device, Manual therapy, and Re-evaluation  PLAN FOR NEXT SESSION: assess HEP response, core strengthening, TPDN and massage    Eloy End, PT 04/01/2023, 9:16 AM

## 2023-03-31 NOTE — Progress Notes (Unsigned)
[name] [address] [place] [address] Phone: [phone] Subjective:    I'm seeing this patient by the request  of:  Joselyn Arrow, MD  CC: Back and neck pain follow-up  [id number]  Bethany [name] is a 67 y.o. female coming in with complaint of back and neck pain. OMT 02/25/2023. Patient states that her back has been better but over the last week she has noticed a little pain. R shoulder feels great, L shoulder has been flared since Sunday night she states she felt a pop, she feels better now. Cyst on left arm has been getting smaller just wants it looked at again   Medications patient has been prescribed:   Taking:         Reviewed prior external information including notes and imaging from previsou exam, outside providers and external EMR if available.   As well as notes that were available from care everywhere and other healthcare systems.  Past medical history, social, surgical and family history all reviewed in electronic medical record.  No pertanent information unless stated regarding to the chief complaint.   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 All Paps normal afterwards   Degenerative disc disease, lumbar 05/02/2020   Dyslipidemia    Generalized anxiety disorder    Herpes genitalis    Herpes labialis    History of calcium pyrophosphate deposition disease (CPPD) 03/26/2020   History of opiate abuse, in remission    History of small bowel obstruction 04/17/2018   partial   Hyperlipidemia with target LDL less than 100 06/17/2011   IBS (irritable bowel syndrome)    Lumbago with sciatica, right side 02/10/2021   Major depressive disorder     Meibomian gland dysfunction (MGD) of both eyes 08/02/2020   Narcotic bowel syndrome (HCC)    Nonallopathic lesion of lumbosacral region 08/05/2017   Nonallopathic lesion of sacral region 08/05/2017   Nonallopathic lesion of thoracic region 08/05/2017   Osteopenia 04/2014   T score -1.7 FRAX 7.3%/0.8% stable from prior DEXA 2013   Raynaud's disease 06/17/2011   Recovering alcoholic in remission     S/P LASIK (laser assisted in situ keratomileusis) 08/02/2020   right eye   Spondylolisthesis of lumbar region 02/10/2021   Status post total right knee replacement 10/31/2019   Tubular adenoma of colon 04/21/2016    Allergies  Allergen Reactions   Morphine And Codeine Nausea Only     Review of Systems:  No headache, visual changes, nausea, vomiting, diarrhea, constipation, dizziness, abdominal pain, skin rash, fevers, chills, night sweats, weight loss, swollen lymph nodes, body aches, joint swelling, chest pain, shortness of breath, mood changes. POSITIVE muscle aches  Objective  Blood pressure 102/80, pulse 69, height 5\' 5"  (1.651 m), weight 118 lb (53.5 kg), SpO2 100 %.   General: No apparent distress alert and oriented x3 mood and affect normal, dressed appropriately.  HEENT: Pupils equal, extraocular movements intact  Respiratory: Patient's speak in full sentences and does not appear short of breath  Cardiovascular: No lower extremity edema, non tender, no erythema  Left shoulder exam seems to be doing okay with some very mild impingement noted.  Right shoulder significant improvement noted with some very good  strength noted.  Neurovascular intact distally.  Limited muscular skeletal ultrasound was performed and interpreted by Antoine Primas, M  Limited ultrasound of the patient's shoulder on the left side but shows that there is some mild thickening of the anterior capsule noted.  Rotator cuff does have what appears to be a very small partial tear of the supraspinatus  noted.  Osteopathic findings  C2 flexed rotated and side bent right C6 flexed rotated and side bent left T3 extended rotated and side bent right inhaled rib T6 extended rotated and side bent left T9 extended rotated and side bent left L2 flexed rotated and side bent right Sacrum right on right       Assessment and Plan:  Spondylolisthesis of lumbar region Known arthritic changes but has responded well to osteopathic manipulation as well as formal physical therapy at this time.  Continue with patient to stay active.  Will continue to work on core strength and muscle building.  Follow-up with me again in 6 to 8 weeks  Seroma due to trauma [facility]) Seroma of the arm does seem to be smaller at this time.  No changes.  Left rotator cuff tear Seems to be an incomplete tear but would did have an audible pop noted.  There is some mild low-grade tearing noted.  Do not see any true retraction.  Will see how patient does with conservative therapy and home exercises.  Follow-up again in 6 to 8 weeks otherwise.    Nonallopathic problems  Decision today to treat with OMT was based on Physical Exam  After verbal consent patient was treated with HVLA, ME, FPR techniques in cervical, rib, thoracic, lumbar, and sacral  areas  Patient tolerated the procedure well with improvement in symptoms  Patient given exercises, stretches and lifestyle modifications  See medications in patient instructions if given  Patient will follow up in 4-8 weeks             Note: This dictation was prepared with Dragon dictation along with smaller phrase technology. Any transcriptional errors that result from this process are unintentional.

## 2023-04-01 ENCOUNTER — Ambulatory Visit (INDEPENDENT_AMBULATORY_CARE_PROVIDER_SITE_OTHER): Payer: Medicare Other | Admitting: Family Medicine

## 2023-04-01 ENCOUNTER — Encounter: Payer: Self-pay | Admitting: Family Medicine

## 2023-04-01 ENCOUNTER — Other Ambulatory Visit: Payer: Self-pay

## 2023-04-01 VITALS — BP 102/80 | HR 69 | Ht 65.0 in | Wt 118.0 lb

## 2023-04-01 DIAGNOSIS — S46012A Strain of muscle(s) and tendon(s) of the rotator cuff of left shoulder, initial encounter: Secondary | ICD-10-CM

## 2023-04-01 DIAGNOSIS — M79602 Pain in left arm: Secondary | ICD-10-CM

## 2023-04-01 DIAGNOSIS — M9903 Segmental and somatic dysfunction of lumbar region: Secondary | ICD-10-CM

## 2023-04-01 DIAGNOSIS — M9901 Segmental and somatic dysfunction of cervical region: Secondary | ICD-10-CM | POA: Diagnosis not present

## 2023-04-01 DIAGNOSIS — M9908 Segmental and somatic dysfunction of rib cage: Secondary | ICD-10-CM

## 2023-04-01 DIAGNOSIS — M9902 Segmental and somatic dysfunction of thoracic region: Secondary | ICD-10-CM | POA: Diagnosis not present

## 2023-04-01 DIAGNOSIS — M9904 Segmental and somatic dysfunction of sacral region: Secondary | ICD-10-CM

## 2023-04-01 DIAGNOSIS — T792XXA Traumatic secondary and recurrent hemorrhage and seroma, initial encounter: Secondary | ICD-10-CM

## 2023-04-01 DIAGNOSIS — M4316 Spondylolisthesis, lumbar region: Secondary | ICD-10-CM | POA: Diagnosis not present

## 2023-04-01 DIAGNOSIS — M75102 Unspecified rotator cuff tear or rupture of left shoulder, not specified as traumatic: Secondary | ICD-10-CM | POA: Insufficient documentation

## 2023-04-01 NOTE — Assessment & Plan Note (Signed)
Seroma of the arm does seem to be smaller at this time.  No changes.

## 2023-04-01 NOTE — Assessment & Plan Note (Signed)
Known arthritic changes but has responded well to osteopathic manipulation as well as formal physical therapy at this time.  Continue with patient to stay active.  Will continue to work on core strength and muscle building.  Follow-up with me again in 6 to 8 weeks

## 2023-04-01 NOTE — Patient Instructions (Signed)
Good to see you Keep doing what ever you are doing it is working 5 week follow up

## 2023-04-01 NOTE — Assessment & Plan Note (Signed)
Seems to be an incomplete tear but would did have an audible pop noted.  There is some mild low-grade tearing noted.  Do not see any true retraction.  Will see how patient does with conservative therapy and home exercises.  Follow-up again in 6 to 8 weeks otherwise.

## 2023-04-05 ENCOUNTER — Ambulatory Visit: Payer: Medicare Other | Attending: Family Medicine

## 2023-04-05 DIAGNOSIS — R2689 Other abnormalities of gait and mobility: Secondary | ICD-10-CM | POA: Diagnosis not present

## 2023-04-05 DIAGNOSIS — M6281 Muscle weakness (generalized): Secondary | ICD-10-CM | POA: Insufficient documentation

## 2023-04-05 DIAGNOSIS — M5459 Other low back pain: Secondary | ICD-10-CM | POA: Diagnosis not present

## 2023-04-05 DIAGNOSIS — M25511 Pain in right shoulder: Secondary | ICD-10-CM | POA: Diagnosis not present

## 2023-04-05 NOTE — Therapy (Signed)
OUTPATIENT PHYSICAL THERAPY TREATMENT   Patient Name: Bethany [name] MRN: [id number] DOB:May 08, 1956, 67 y.o., female Today's Date: 04/05/2023  END OF SESSION:  PT End of Session - 04/05/23 1354     Visit Number 3    Number of Visits 17    Date for PT Re-Evaluation 05/19/23    Authorization Type BC[employer] MCR    PT Start Time 1400    PT Stop Time 1440    PT Time Calculation (min) 40 min    Activity Tolerance Patient tolerated treatment well    Behavior During Therapy WFL for tasks assessed/performed               Past Medical History:  Diagnosis Date   ADHD (attention deficit hyperactivity disorder) 09/13/2018   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 All Paps normal afterwards   Degenerative disc disease, lumbar 05/02/2020   Dyslipidemia    Generalized anxiety disorder    Herpes genitalis    Herpes labialis    History of calcium pyrophosphate deposition disease (CPPD) 03/26/2020   History of opiate abuse, in remission    History of small bowel obstruction 04/17/2018   partial   Hyperlipidemia with target LDL less than 100 06/17/2011   IBS (irritable bowel syndrome)    Lumbago with sciatica, right side 02/10/2021   Major depressive disorder    Meibomian gland dysfunction (MGD) of both eyes 08/02/2020   Narcotic bowel syndrome (HCC)    Nonallopathic lesion of lumbosacral region 08/05/2017   Nonallopathic lesion of sacral region 08/05/2017   Nonallopathic lesion of thoracic region 08/05/2017   Osteopenia 04/2014   T score -1.7 FRAX 7.3%/0.8% stable from prior DEXA 2013   Raynaud's disease 06/17/2011   Recovering alcoholic in remission     S/P LASIK (laser assisted in situ keratomileusis) 08/02/2020   right eye   Spondylolisthesis of lumbar region 02/10/2021   Status post total right knee  replacement 10/31/2019   Tubular adenoma of colon 04/21/2016   Past Surgical History:  Procedure Laterality Date   AUGMENTATION MAMMAPLASTY     BREAST ENHANCEMENT SURGERY  2006   COSMETIC SURGERY     EYE SURGERY     lasik od   GYNECOLOGIC CRYOSURGERY  1990   LAPAROTOMY N/A 04/22/2018   Procedure: EXPLORATORY LAPAROTOMY;  Surgeon: Harriette Bouillon, MD;  Location: [facility] OR;  Service: General;  Laterality: N/A;   LIGAMENT REPAIR Right 09/24/2015   Procedure: RIGHT INDEX METACARPAL PHALANGEAL RADIAL COLLATERAL LIGAMENT REPAIR;  Surgeon: Betha Loa, MD;  Location: [facility];  Service: Orthopedics;  Laterality: Right;   MASS EXCISION N/A 08/04/2018   Procedure: REMOVAL OF SUTURE FROM ABDOMEN/ERAS PATHWAY;  Surgeon: Harriette Bouillon, MD;  Location: [facility];  Service: General;  Laterality: N/A;   OOPHORECTOMY     BSO   Right knee arthoscopy Right 09/09/2017   [facility]   SHX 1 Left 02/19/2020   basel cell carcinoma   SHX1 Right 12/11/2020   basal cell carinoma,micronodular pattern   SKIN BIOPSY Right 02/28/2019   Superficial basal carcinoma    SKIN BIOPSY Left 11/24/2021   superficial basal carcinoma of the left forearm   TONSILLECTOMY AND ADENOIDECTOMY     TOTAL KNEE ARTHROPLASTY Right 04/12/2018   TOTAL KNEE ARTHROPLASTY Right 04/12/2018   Procedure: RIGHT TOTAL KNEE  ARTHROPLASTY;  Surgeon: Marcene Corning, MD;  Location: [facility] OR;  Service: Orthopedics;  Laterality: Right;   TRANSFORAMINAL LUMBAR INTERBODY FUSION W/ MIS 1 LEVEL N/A 09/03/2021   Procedure: Lumbar four-five Minimally invasive transforaminal lumbar interbody fusion;  Surgeon: Dawley, Alan Mulder, DO;  Location: [facility] OR;  Service: Neurosurgery;  Laterality: N/A;   TUBAL LIGATION     UPPER GASTROINTESTINAL ENDOSCOPY  04/24/2005   VAGINAL HYSTERECTOMY  2005   TVH BSO  adenomyosis   Patient Active Problem List   Diagnosis Date Noted   Left rotator cuff tear 04/01/2023   Seroma due to trauma  (HCC) 02/26/2023   Acute bursitis of right shoulder 02/26/2023   Basal cell carcinoma 01/25/2023   Encounter for counseling 01/05/2023   Small bowel obstruction (HCC) 06/16/2022   History of hysterectomy 04/23/2022   Squamous cell carcinoma in situ (SCCIS) of skin of left thigh 08/27/2021   History of opiate abuse, in remission    Spondylolisthesis of lumbar region 02/10/2021   Lumbago with sciatica, right side 02/10/2021   Recovering alcoholic in remission     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 08/02/2020   Meibomian gland dysfunction (MGD) of both eyes 08/02/2020   S/P LASIK (laser assisted in situ keratomileusis) 08/02/2020   Degenerative disc disease, lumbar 05/02/2020   History of calcium pyrophosphate deposition disease (CPPD) 03/26/2020   Arthritis of carpometacarpal (CMC) joint of left thumb 01/18/2020   Status post total right knee replacement 10/31/2019   ADHD (attention deficit hyperactivity disorder) 09/13/2018   IBS (irritable bowel syndrome) 04/17/2018   History of small bowel obstruction 04/17/2018   Nonallopathic lesion of thoracic region 08/05/2017   Nonallopathic lesion of lumbosacral region 08/05/2017   Nonallopathic lesion of sacral region 08/05/2017   Tubular adenoma of colon 04/21/2016   Hyperlipidemia with target LDL less than 100 06/17/2011   Raynaud's disease 06/17/2011    PCP:  Joselyn Arrow, MD  REFERRING PROVIDER: Judi Saa, DO   REFERRING DIAG: [phone] (ICD-10-CM) - Right shoulder pain, unspecified chronicity   THERAPY DIAG:  Right shoulder pain, unspecified chronicity  Muscle weakness (generalized)  Rationale for Evaluation and Treatment: Rehabilitation  ONSET DATE: Chronic  SUBJECTIVE:                                                                                                                                                                                      SUBJECTIVE STATEMENT: Pt presents to PT with  reports of continued lower back pain and discomfort. Has been compliant with HEP, notes she has been playing a lot of pickle ball and feels this has contributed to pain.   Hand dominance:  Right  PERTINENT HISTORY: Lumbar fusion, R TKA, SBO  PAIN:  Are you having pain?  Yes: NPRS scale: 5/10 Worst: 6/10 Pain location: lower back Pain description: R lower back Aggravating factors: pickle ball, jogging Relieving factors: rest, medication, heat  PRECAUTIONS: None  WEIGHT BEARING RESTRICTIONS: No  FALLS:  Has patient fallen in last 6 months? No  LIVING ENVIRONMENT: Lives with: lives with their family Lives in: House/apartment Stairs: No Has following equipment at home: None  OCCUPATION: Retired  PLOF: Independent  PATIENT GOALS: pt wants to improve her lower back discomfort in order to play pickle ball and exercise with decreased pain  OBJECTIVE:   DIAGNOSTIC FINDINGS:  See imaging  PATIENT SURVEYS:  FOTO: 67% function; 67% predicted  COGNITION: Overall cognitive status: Within functional limits for tasks assessed     SENSATION: WFL  POSTURE: Rounded shoulders, fwd head, reduced lumbar lordosis   UPPER EXTREMITY ROM:   Active ROM Right eval Left eval  Shoulder flexion WNL WNL  Shoulder extension    Shoulder abduction WNL WNL  Shoulder adduction    Shoulder internal rotation    Shoulder external rotation    Elbow flexion    Elbow extension    Wrist flexion    Wrist extension    Wrist ulnar deviation    Wrist radial deviation    Wrist pronation    Wrist supination    (Blank rows = not tested)  UPPER EXTREMITY MMT:  MMT Right eval Left eval  Shoulder flexion WNL WNL  Shoulder extension    Shoulder abduction WNL WNL  Shoulder adduction    Shoulder internal rotation WNL WNL  Shoulder external rotation WNL WNL  Middle trapezius    Lower trapezius    Elbow flexion    Elbow extension    Wrist flexion    Wrist extension    Wrist ulnar  deviation    Wrist radial deviation    Wrist pronation    Wrist supination    Grip strength (lbs)    (Blank rows = not tested)  LOWER EXTREMITY ROM:   Active ROM Right eval Left eval  Shoulder flexion 90 WNL  Shoulder extension 2 WFL  (Blank rows = not tested)  SHOULDER SPECIAL TESTS: Impingement tests: DNT SLAP lesions: DNT Instability tests: DNT Rotator cuff assessment: DNT Biceps assessment: DNT  FUNCTIONAL TESTS:  30 Second Sit to Stand: 10 reps  PALPATION:  Significant TTP to lumbar paraspinals, R>L; no overt TTP to R shoulder    TREATMENT: [facility] Adult PT Treatment:                                                DATE: 04/05/2023 Therapeutic Exercise: Rec bike x 3 min - working on improving R knee flex Supine PPT x 10 - 5" hold Supine PPT with ball 2x10 Supine pilates SLR 2x10 each Supine PPT with clamshell 2x15 GTB DTKC x 45" Manual Therapy: Skilled palpation of trigger points for TPDN STM to lumbar paraspinals in prone  Trigger Point Dry Needling Treatment: Pre-treatment instruction: Patient instructed on dry needling rationale, procedures, and possible side effects including pain during treatment (achy,cramping feeling), bruising, drop of blood, lightheadedness, nausea, sweating. Patient Consent Given: Yes Education handout provided: No Muscles treated: R lumbar paraspinals  Needle size and number: .30x73mm x 2 Electrical stimulation performed: No Parameters: N/A Treatment response/outcome:  Twitch response elicited and Palpable decrease in muscle tension Post-treatment instructions: Patient instructed to expect possible mild to moderate muscle soreness later today and/or tomorrow. Patient instructed in methods to reduce muscle soreness and to continue prescribed HEP. If patient was dry needled over the lung field, patient was instructed on signs and symptoms of pneumothorax and, however unlikely, to see immediate medical attention should they occur. Patient was  also educated on signs and symptoms of infection and to seek medical attention should they occur. Patient verbalized understanding of these instructions and education.  [facility] Adult PT Treatment:                                                DATE: 03/31/2023 Therapeutic Exercise: NuStep lvl 5 x 5 min while taking subjective Slant board calf stretch 2x30" Supine PPT x 10 - 5" hold Supine PPT with march 2x20 Supine heel slide x 10 - 5" hold Rec bike x 3 min - working on improving R knee flex Manual Therapy: Skilled palpation of trigger points for TPDN STM to lumbar paraspinals in prone  Trigger Point Dry Needling Treatment: Pre-treatment instruction: Patient instructed on dry needling rationale, procedures, and possible side effects including pain during treatment (achy,cramping feeling), bruising, drop of blood, lightheadedness, nausea, sweating. Patient Consent Given: Yes Education handout provided: No Muscles treated: R lumbar paraspinals  Needle size and number: .30x69mm x 2 Electrical stimulation performed: No Parameters: N/A Treatment response/outcome: Twitch response elicited and Palpable decrease in muscle tension Post-treatment instructions: Patient instructed to expect possible mild to moderate muscle soreness later today and/or tomorrow. Patient instructed in methods to reduce muscle soreness and to continue prescribed HEP. If patient was dry needled over the lung field, patient was instructed on signs and symptoms of pneumothorax and, however unlikely, to see immediate medical attention should they occur. Patient was also educated on signs and symptoms of infection and to seek medical attention should they occur. Patient verbalized understanding of these instructions and education.  [facility] Adult PT Treatment:                                                DATE: 03/24/2023 Therapeutic Exercise: DKTC x 60" Supine PPT x 5 - 5" hold LTR x 5 each Manual Therapy: STM to lumbar  paraspinals in prone   PATIENT EDUCATION: Education details: continue HEP Person educated: Patient Education method: Explanation, Demonstration, and Handouts Education comprehension: verbalized understanding and returned demonstration  HOME EXERCISE PROGRAM: Access Code: [email or web address] URL: [email or web address] Date: 03/23/2023 Prepared by: Edwinna Areola  Exercises - Supine Double Knee to Chest  - 7 x weekly - 3 sets - 60 sec hold - Supine Posterior Pelvic Tilt  - 1 x daily - 7 x weekly - 2 sets - 10 reps - 5 sec hold - Supine Lower Trunk Rota tion  - 1 x daily - 7 x weekly - 2 sets - 10 reps - 5 sec hold  ASSESSMENT:  CLINICAL IMPRESSION: Pt was able to complete all prescribed exercises with no adverse effect. Therapy focused on improving core strength and R knee mobility to decrease LBP. She responded well to TPDN and manual therapy, noting decrease in pain post session. Pt continues to benefit  from skilled PT, will continue per POC   OBJECTIVE IMPAIRMENTS: Abnormal gait, decreased balance, decreased cognition, decreased endurance, decreased mobility, difficulty walking, decreased ROM, decreased strength, postural dysfunction, and pain.   ACTIVITY LIMITATIONS: carrying, lifting, bending, standing, squatting, stairs, and transfers  PARTICIPATION LIMITATIONS: community activity, yard work, and recreational activity  PERSONAL FACTORS: Time since onset of injury/illness/exacerbation and 3+ comorbidities: Lumbar fusion, R TKA, SBO  are also affecting patient's functional outcome.   REHAB POTENTIAL: Good  CLINICAL DECISION MAKING: Stable/uncomplicated  EVALUATION COMPLEXITY: Low   GOALS: Goals reviewed with patient? No  SHORT TERM GOALS: Target date: 04/13/2023   Pt will be compliant and knowledgeable with initial HEP for improved comfort and carryover Baseline: initial HEP given  Goal status: INITIAL  2.  Pt will self report back pain no greater than 4/10 for  improved comfort and functional ability Baseline: 6/10 at worst Goal status: INITIAL   LONG TERM GOALS: Target date: 05/18/2023   Pt will improve FOTO function score to no less than 75% as proxy for functional improvement Baseline: 67% function Goal status: INITIAL  2.  Pt will self report back pain no greater than 1-2/10 for improved comfort and functional ability Baseline: 6/10 at worst Goal status: INITIAL   3.  Pt will increase 30 Second Sit to Stand rep count to no less than 12 reps for improved balance, strength, and functional mobility Baseline: 10 reps  Goal status: INITIAL   4.  Pt will improve R knee flexion to no less than 100 degrees for improved mobility and decreased stress to lower back Baseline: see ROM chart Goal status: INITIAL   PLAN:  PT FREQUENCY: 1-2x/week  PT DURATION: 8 weeks  PLANNED INTERVENTIONS: Therapeutic exercises, Therapeutic activity, Neuromuscular re-education, Balance training, Gait training, Patient/Family education, Self Care, Joint mobilization, Aquatic Therapy, Dry Needling, Cryotherapy, Moist heat, Vasopneumatic device, Manual therapy, and Re-evaluation  PLAN FOR NEXT SESSION: assess HEP response, core strengthening, TPDN and massage    Eloy End, PT 04/05/2023, 2:44 PM

## 2023-04-07 ENCOUNTER — Telehealth: Payer: Self-pay

## 2023-04-07 ENCOUNTER — Ambulatory Visit: Payer: Medicare Other

## 2023-04-07 NOTE — Telephone Encounter (Signed)
PT called and spoke to patient regarding missed appointment. Reminded her of next scheduled visit.  Eloy End   04/07/23 2:22 PM

## 2023-04-12 ENCOUNTER — Ambulatory Visit: Payer: Medicare Other

## 2023-04-12 NOTE — Therapy (Deleted)
OUTPATIENT PHYSICAL THERAPY TREATMENT   Patient Name: Bethany [name] MRN: [id number] DOB:09/04/1956, 67 y.o., female Today's Date: 04/05/2023  END OF SESSION:  PT End of Session - 04/05/23 1354     Visit Number 3    Number of Visits 17    Date for PT Re-Evaluation 05/19/23    Authorization Type [employer] MCR    PT Start Time 1400    PT Stop Time 1440    PT Time Calculation (min) 40 min    Activity Tolerance Patient tolerated treatment well    Behavior During Therapy WFL for tasks assessed/performed               Past Medical History:  Diagnosis Date   ADHD (attention deficit hyperactivity disorder) 09/13/2018   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 All Paps normal afterwards   Degenerative disc disease, lumbar 05/02/2020   Dyslipidemia    Generalized anxiety disorder    Herpes genitalis    Herpes labialis    History of calcium pyrophosphate deposition disease (CPPD) 03/26/2020   History of opiate abuse, in remission    History of small bowel obstruction 04/17/2018   partial   Hyperlipidemia with target LDL less than 100 06/17/2011   IBS (irritable bowel syndrome)    Lumbago with sciatica, right side 02/10/2021   Major depressive disorder    Meibomian gland dysfunction (MGD) of both eyes 08/02/2020   Narcotic bowel syndrome (HCC)    Nonallopathic lesion of lumbosacral region 08/05/2017   Nonallopathic lesion of sacral region 08/05/2017   Nonallopathic lesion of thoracic region 08/05/2017   Osteopenia 04/2014   T score -1.7 FRAX 7.3%/0.8% stable from prior DEXA 2013   Raynaud's disease 06/17/2011   Recovering alcoholic in remission     S/P LASIK (laser assisted in situ keratomileusis) 08/02/2020   right eye   Spondylolisthesis of lumbar region 02/10/2021   Status post total right knee  replacement 10/31/2019   Tubular adenoma of colon 04/21/2016   Past Surgical History:  Procedure Laterality Date   AUGMENTATION MAMMAPLASTY     BREAST ENHANCEMENT SURGERY  2006   COSMETIC SURGERY     EYE SURGERY     lasik od   GYNECOLOGIC CRYOSURGERY  1990   LAPAROTOMY N/A 04/22/2018   Procedure: EXPLORATORY LAPAROTOMY;  Surgeon: Cornett, Thomas, MD;  Location: [facility];  Service: General;  Laterality: N/A;   LIGAMENT REPAIR Right 09/24/2015   Procedure: RIGHT INDEX METACARPAL PHALANGEAL RADIAL COLLATERAL LIGAMENT REPAIR;  Surgeon: Kevin Kuzma, MD;  Location: [facility];  Service: Orthopedics;  Laterality: Right;   MASS EXCISION N/A 08/04/2018   Procedure: REMOVAL OF SUTURE FROM ABDOMEN/ERAS PATHWAY;  Surgeon: Cornett, Thomas, MD;  Location: [facility];  Service: General;  Laterality: N/A;   OOPHORECTOMY     BSO   Right knee arthoscopy Right 09/09/2017   [facility]   SHX 1 Left 02/19/2020   basel cell carcinoma   SHX1 Right 12/11/2020   basal cell carinoma,micronodular pattern   SKIN BIOPSY Right 02/28/2019   Superficial basal carcinoma    SKIN BIOPSY Left 11/24/2021   superficial basal carcinoma of the left forearm   TONSILLECTOMY AND ADENOIDECTOMY     TOTAL KNEE ARTHROPLASTY Right 04/12/2018   TOTAL KNEE ARTHROPLASTY Right 04/12/2018   Procedure: RIGHT TOTAL KNEE   ARTHROPLASTY;  Surgeon: Dalldorf, Peter, MD;  Location: [facility];  Service: Orthopedics;  Laterality: Right;   TRANSFORAMINAL LUMBAR INTERBODY FUSION W/ MIS 1 LEVEL N/A 09/03/2021   Procedure: Lumbar four-five Minimally invasive transforaminal lumbar interbody fusion;  Surgeon: Dawley, Troy C, DO;  Location: [facility];  Service: Neurosurgery;  Laterality: N/A;   TUBAL LIGATION     UPPER GASTROINTESTINAL ENDOSCOPY  04/24/2005   VAGINAL HYSTERECTOMY  2005   TVH BSO  adenomyosis   Patient Active Problem List   Diagnosis Date Noted   Left rotator cuff tear 04/01/2023   Seroma due to trauma  (HCC) 02/26/2023   Acute bursitis of right shoulder 02/26/2023   Basal cell carcinoma 01/25/2023   Encounter for counseling 01/05/2023   Small bowel obstruction (HCC) 06/16/2022   History of hysterectomy 04/23/2022   Squamous cell carcinoma in situ (SCCIS) of skin of left thigh 08/27/2021   History of opiate abuse, in remission    Spondylolisthesis of lumbar region 02/10/2021   Lumbago with sciatica, right side 02/10/2021   Recovering alcoholic in remission     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 08/02/2020   Meibomian gland dysfunction (MGD) of both eyes 08/02/2020   S/P LASIK (laser assisted in situ keratomileusis) 08/02/2020   Degenerative disc disease, lumbar 05/02/2020   History of calcium pyrophosphate deposition disease (CPPD) 03/26/2020   Arthritis of carpometacarpal (CMC) joint of left thumb 01/18/2020   Status post total right knee replacement 10/31/2019   ADHD (attention deficit hyperactivity disorder) 09/13/2018   IBS (irritable bowel syndrome) 04/17/2018   History of small bowel obstruction 04/17/2018   Nonallopathic lesion of thoracic region 08/05/2017   Nonallopathic lesion of lumbosacral region 08/05/2017   Nonallopathic lesion of sacral region 08/05/2017   Tubular adenoma of colon 04/21/2016   Hyperlipidemia with target LDL less than 100 06/17/2011   Raynaud's disease 06/17/2011    PCP:  Knapp, Eve, MD  REFERRING PROVIDER: Smith, Zachary M, DO   REFERRING DIAG: M25.511 (ICD-10-CM) - Right shoulder pain, unspecified chronicity   THERAPY DIAG:  Right shoulder pain, unspecified chronicity  Muscle weakness (generalized)  Rationale for Evaluation and Treatment: Rehabilitation  ONSET DATE: Chronic  SUBJECTIVE:                                                                                                                                                                                      SUBJECTIVE STATEMENT: Pt presents to PT with  reports of continued lower back pain and discomfort. Has been compliant with HEP, notes she has been playing a lot of pickle ball and feels this has contributed to pain.   Hand dominance:   Right  PERTINENT HISTORY: Lumbar fusion, R TKA, SBO  PAIN:  Are you having pain?  Yes: NPRS scale: 5/10 Worst: 6/10 Pain location: lower back Pain description: R lower back Aggravating factors: pickle ball, jogging Relieving factors: rest, medication, heat  PRECAUTIONS: None  WEIGHT BEARING RESTRICTIONS: No  FALLS:  Has patient fallen in last 6 months? No  LIVING ENVIRONMENT: Lives with: lives with their family Lives in: House/apartment Stairs: No Has following equipment at home: None  OCCUPATION: Retired  PLOF: Independent  PATIENT GOALS: pt wants to improve her lower back discomfort in order to play pickle ball and exercise with decreased pain  OBJECTIVE:   DIAGNOSTIC FINDINGS:  See imaging  PATIENT SURVEYS:  FOTO: 67% function; 67% predicted  COGNITION: Overall cognitive status: Within functional limits for tasks assessed     SENSATION: WFL  POSTURE: Rounded shoulders, fwd head, reduced lumbar lordosis   UPPER EXTREMITY ROM:   Active ROM Right eval Left eval  Shoulder flexion WNL WNL  Shoulder extension    Shoulder abduction WNL WNL  Shoulder adduction    Shoulder internal rotation    Shoulder external rotation    Elbow flexion    Elbow extension    Wrist flexion    Wrist extension    Wrist ulnar deviation    Wrist radial deviation    Wrist pronation    Wrist supination    (Blank rows = not tested)  UPPER EXTREMITY MMT:  MMT Right eval Left eval  Shoulder flexion WNL WNL  Shoulder extension    Shoulder abduction WNL WNL  Shoulder adduction    Shoulder internal rotation WNL WNL  Shoulder external rotation WNL WNL  Middle trapezius    Lower trapezius    Elbow flexion    Elbow extension    Wrist flexion    Wrist extension    Wrist ulnar  deviation    Wrist radial deviation    Wrist pronation    Wrist supination    Grip strength (lbs)    (Blank rows = not tested)  LOWER EXTREMITY ROM:   Active ROM Right eval Left eval  Shoulder flexion 90 WNL  Shoulder extension 2 WFL  (Blank rows = not tested)  SHOULDER SPECIAL TESTS: Impingement tests: DNT SLAP lesions: DNT Instability tests: DNT Rotator cuff assessment: DNT Biceps assessment: DNT  FUNCTIONAL TESTS:  30 Second Sit to Stand: 10 reps  PALPATION:  Significant TTP to lumbar paraspinals, R>L; no overt TTP to R shoulder    TREATMENT: [facility] Adult PT Treatment:                                                DATE: 04/05/2023 Therapeutic Exercise: Rec bike x 3 min - working on improving R knee flex Supine PPT x 10 - 5" hold Supine PPT with ball 2x10 Supine pilates SLR 2x10 each Supine PPT with clamshell 2x15 GTB DTKC x 45" Manual Therapy: Skilled palpation of trigger points for TPDN STM to lumbar paraspinals in prone  Trigger Point Dry Needling Treatment: Pre-treatment instruction: Patient instructed on dry needling rationale, procedures, and possible side effects including pain during treatment (achy,cramping feeling), bruising, drop of blood, lightheadedness, nausea, sweating. Patient Consent Given: Yes Education handout provided: No Muscles treated: R lumbar paraspinals  Needle size and number: .30x30mm x 2 Electrical stimulation performed: No Parameters: N/A Treatment response/outcome:   Twitch response elicited and Palpable decrease in muscle tension Post-treatment instructions: Patient instructed to expect possible mild to moderate muscle soreness later today and/or tomorrow. Patient instructed in methods to reduce muscle soreness and to continue prescribed HEP. If patient was dry needled over the lung field, patient was instructed on signs and symptoms of pneumothorax and, however unlikely, to see immediate medical attention should they occur. Patient was  also educated on signs and symptoms of infection and to seek medical attention should they occur. Patient verbalized understanding of these instructions and education.  [facility] Adult PT Treatment:                                                DATE: 03/31/2023 Therapeutic Exercise: NuStep lvl 5 x 5 min while taking subjective Slant board calf stretch 2x30" Supine PPT x 10 - 5" hold Supine PPT with march 2x20 Supine heel slide x 10 - 5" hold Rec bike x 3 min - working on improving R knee flex Manual Therapy: Skilled palpation of trigger points for TPDN STM to lumbar paraspinals in prone  Trigger Point Dry Needling Treatment: Pre-treatment instruction: Patient instructed on dry needling rationale, procedures, and possible side effects including pain during treatment (achy,cramping feeling), bruising, drop of blood, lightheadedness, nausea, sweating. Patient Consent Given: Yes Education handout provided: No Muscles treated: R lumbar paraspinals  Needle size and number: .30x30mm x 2 Electrical stimulation performed: No Parameters: N/A Treatment response/outcome: Twitch response elicited and Palpable decrease in muscle tension Post-treatment instructions: Patient instructed to expect possible mild to moderate muscle soreness later today and/or tomorrow. Patient instructed in methods to reduce muscle soreness and to continue prescribed HEP. If patient was dry needled over the lung field, patient was instructed on signs and symptoms of pneumothorax and, however unlikely, to see immediate medical attention should they occur. Patient was also educated on signs and symptoms of infection and to seek medical attention should they occur. Patient verbalized understanding of these instructions and education.  [facility] Adult PT Treatment:                                                DATE: 03/24/2023 Therapeutic Exercise: DKTC x 60" Supine PPT x 5 - 5" hold LTR x 5 each Manual Therapy: STM to lumbar  paraspinals in prone   PATIENT EDUCATION: Education details: continue HEP Person educated: Patient Education method: Explanation, Demonstration, and Handouts Education comprehension: verbalized understanding and returned demonstration  HOME EXERCISE PROGRAM: Access Code: [email or web address] URL: [email or web address] Date: 03/23/2023 Prepared by: David Stroup  Exercises - Supine Double Knee to Chest  - 7 x weekly - 3 sets - 60 sec hold - Supine Posterior Pelvic Tilt  - 1 x daily - 7 x weekly - 2 sets - 10 reps - 5 sec hold - Supine Lower Trunk Rota tion  - 1 x daily - 7 x weekly - 2 sets - 10 reps - 5 sec hold  ASSESSMENT:  CLINICAL IMPRESSION: Pt was able to complete all prescribed exercises with no adverse effect. Therapy focused on improving core strength and R knee mobility to decrease LBP. She responded well to TPDN and manual therapy, noting decrease in pain post session. Pt continues to benefit   from skilled PT, will continue per POC   OBJECTIVE IMPAIRMENTS: Abnormal gait, decreased balance, decreased cognition, decreased endurance, decreased mobility, difficulty walking, decreased ROM, decreased strength, postural dysfunction, and pain.   ACTIVITY LIMITATIONS: carrying, lifting, bending, standing, squatting, stairs, and transfers  PARTICIPATION LIMITATIONS: community activity, yard work, and recreational activity  PERSONAL FACTORS: Time since onset of injury/illness/exacerbation and 3+ comorbidities: Lumbar fusion, R TKA, SBO  are also affecting patient's functional outcome.   REHAB POTENTIAL: Good  CLINICAL DECISION MAKING: Stable/uncomplicated  EVALUATION COMPLEXITY: Low   GOALS: Goals reviewed with patient? No  SHORT TERM GOALS: Target date: 04/13/2023   Pt will be compliant and knowledgeable with initial HEP for improved comfort and carryover Baseline: initial HEP given  Goal status: INITIAL  2.  Pt will self report back pain no greater than 4/10 for  improved comfort and functional ability Baseline: 6/10 at worst Goal status: INITIAL   LONG TERM GOALS: Target date: 05/18/2023   Pt will improve FOTO function score to no less than 75% as proxy for functional improvement Baseline: 67% function Goal status: INITIAL  2.  Pt will self report back pain no greater than 1-2/10 for improved comfort and functional ability Baseline: 6/10 at worst Goal status: INITIAL   3.  Pt will increase 30 Second Sit to Stand rep count to no less than 12 reps for improved balance, strength, and functional mobility Baseline: 10 reps  Goal status: INITIAL   4.  Pt will improve R knee flexion to no less than 100 degrees for improved mobility and decreased stress to lower back Baseline: see ROM chart Goal status: INITIAL   PLAN:  PT FREQUENCY: 1-2x/week  PT DURATION: 8 weeks  PLANNED INTERVENTIONS: Therapeutic exercises, Therapeutic activity, Neuromuscular re-education, Balance training, Gait training, Patient/Family education, Self Care, Joint mobilization, Aquatic Therapy, Dry Needling, Cryotherapy, Moist heat, Vasopneumatic device, Manual therapy, and Re-evaluation  PLAN FOR NEXT SESSION: assess HEP response, core strengthening, TPDN and massage    David C Stroup, PT 04/05/2023, 2:44 PM  

## 2023-04-13 DIAGNOSIS — F411 Generalized anxiety disorder: Secondary | ICD-10-CM | POA: Diagnosis not present

## 2023-04-13 DIAGNOSIS — F1421 Cocaine dependence, in remission: Secondary | ICD-10-CM | POA: Diagnosis not present

## 2023-04-13 DIAGNOSIS — F1221 Cannabis dependence, in remission: Secondary | ICD-10-CM | POA: Diagnosis not present

## 2023-04-13 DIAGNOSIS — F502 Bulimia nervosa: Secondary | ICD-10-CM | POA: Diagnosis not present

## 2023-04-13 DIAGNOSIS — F1021 Alcohol dependence, in remission: Secondary | ICD-10-CM | POA: Diagnosis not present

## 2023-04-13 DIAGNOSIS — F1521 Other stimulant dependence, in remission: Secondary | ICD-10-CM | POA: Diagnosis not present

## 2023-04-14 ENCOUNTER — Ambulatory Visit: Payer: Medicare Other

## 2023-04-19 ENCOUNTER — Ambulatory Visit: Payer: Medicare Other

## 2023-04-19 DIAGNOSIS — M5459 Other low back pain: Secondary | ICD-10-CM | POA: Diagnosis not present

## 2023-04-19 DIAGNOSIS — R2689 Other abnormalities of gait and mobility: Secondary | ICD-10-CM | POA: Diagnosis not present

## 2023-04-19 DIAGNOSIS — M6281 Muscle weakness (generalized): Secondary | ICD-10-CM | POA: Diagnosis not present

## 2023-04-19 DIAGNOSIS — M25511 Pain in right shoulder: Secondary | ICD-10-CM

## 2023-04-19 NOTE — Therapy (Signed)
OUTPATIENT PHYSICAL THERAPY TREATMENT   Patient Name: Bethany [name] MRN: [id number] DOB:1956/05/10, 67 y.o., female Today's Date: 04/19/2023  END OF SESSION:  PT End of Session - 04/19/23 1400     Visit Number 4    Number of Visits 17    Date for PT Re-Evaluation 05/19/23    Authorization Type [employer] MCR    PT Start Time 1400    PT Stop Time 1440    PT Time Calculation (min) 40 min    Activity Tolerance Patient tolerated treatment well    Behavior During Therapy WFL for tasks assessed/performed                Past Medical History:  Diagnosis Date   ADHD (attention deficit hyperactivity disorder) 09/13/2018   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 All Paps normal afterwards   Degenerative disc disease, lumbar 05/02/2020   Dyslipidemia    Generalized anxiety disorder    Herpes genitalis    Herpes labialis    History of calcium pyrophosphate deposition disease (CPPD) 03/26/2020   History of opiate abuse, in remission    History of small bowel obstruction 04/17/2018   partial   Hyperlipidemia with target LDL less than 100 06/17/2011   IBS (irritable bowel syndrome)    Lumbago with sciatica, right side 02/10/2021   Major depressive disorder    Meibomian gland dysfunction (MGD) of both eyes 08/02/2020   Narcotic bowel syndrome (HCC)    Nonallopathic lesion of lumbosacral region 08/05/2017   Nonallopathic lesion of sacral region 08/05/2017   Nonallopathic lesion of thoracic region 08/05/2017   Osteopenia 04/2014   T score -1.7 FRAX 7.3%/0.8% stable from prior DEXA 2013   Raynaud's disease 06/17/2011   Recovering alcoholic in remission     S/P LASIK (laser assisted in situ keratomileusis) 08/02/2020   right eye   Spondylolisthesis of lumbar region 02/10/2021   Status post total right  knee replacement 10/31/2019   Tubular adenoma of colon 04/21/2016   Past Surgical History:  Procedure Laterality Date   AUGMENTATION MAMMAPLASTY     BREAST ENHANCEMENT SURGERY  2006   COSMETIC SURGERY     EYE SURGERY     lasik od   GYNECOLOGIC CRYOSURGERY  1990   LAPAROTOMY N/A 04/22/2018   Procedure: EXPLORATORY LAPAROTOMY;  Surgeon: Harriette Bouillon, MD;  Location: [facility];  Service: General;  Laterality: N/A;   LIGAMENT REPAIR Right 09/24/2015   Procedure: RIGHT INDEX METACARPAL PHALANGEAL RADIAL COLLATERAL LIGAMENT REPAIR;  Surgeon: Betha Loa, MD;  Location: [facility];  Service: Orthopedics;  Laterality: Right;   MASS EXCISION N/A 08/04/2018   Procedure: REMOVAL OF SUTURE FROM ABDOMEN/ERAS PATHWAY;  Surgeon: Harriette Bouillon, MD;  Location: [facility];  Service: General;  Laterality: N/A;   OOPHORECTOMY     BSO   Right knee arthoscopy Right 09/09/2017   [facility]   SHX 1 Left 02/19/2020   basel cell carcinoma   SHX1 Right 12/11/2020   basal cell carinoma,micronodular pattern   SKIN BIOPSY Right 02/28/2019   Superficial basal carcinoma    SKIN BIOPSY Left 11/24/2021   superficial basal carcinoma of the left forearm   TONSILLECTOMY AND ADENOIDECTOMY     TOTAL KNEE ARTHROPLASTY Right 04/12/2018   TOTAL KNEE ARTHROPLASTY Right 04/12/2018   Procedure: RIGHT TOTAL  KNEE ARTHROPLASTY;  Surgeon: Marcene Corning, MD;  Location: [facility];  Service: Orthopedics;  Laterality: Right;   TRANSFORAMINAL LUMBAR INTERBODY FUSION W/ MIS 1 LEVEL N/A 09/03/2021   Procedure: Lumbar four-five Minimally invasive transforaminal lumbar interbody fusion;  Surgeon: Dawley, Alan Mulder, DO;  Location: [facility];  Service: Neurosurgery;  Laterality: N/A;   TUBAL LIGATION     UPPER GASTROINTESTINAL ENDOSCOPY  04/24/2005   VAGINAL HYSTERECTOMY  2005   TVH BSO  adenomyosis   Patient Active Problem List   Diagnosis Date Noted   Left rotator cuff tear 04/01/2023   Seroma due to  trauma (HCC) 02/26/2023   Acute bursitis of right shoulder 02/26/2023   Basal cell carcinoma 01/25/2023   Encounter for counseling 01/05/2023   Small bowel obstruction (HCC) 06/16/2022   History of hysterectomy 04/23/2022   Squamous cell carcinoma in situ (SCCIS) of skin of left thigh 08/27/2021   History of opiate abuse, in remission    Spondylolisthesis of lumbar region 02/10/2021   Lumbago with sciatica, right side 02/10/2021   Recovering alcoholic in remission     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 08/02/2020   Meibomian gland dysfunction (MGD) of both eyes 08/02/2020   S/P LASIK (laser assisted in situ keratomileusis) 08/02/2020   Degenerative disc disease, lumbar 05/02/2020   History of calcium pyrophosphate deposition disease (CPPD) 03/26/2020   Arthritis of carpometacarpal (CMC) joint of left thumb 01/18/2020   Status post total right knee replacement 10/31/2019   ADHD (attention deficit hyperactivity disorder) 09/13/2018   IBS (irritable bowel syndrome) 04/17/2018   History of small bowel obstruction 04/17/2018   Nonallopathic lesion of thoracic region 08/05/2017   Nonallopathic lesion of lumbosacral region 08/05/2017   Nonallopathic lesion of sacral region 08/05/2017   Tubular adenoma of colon 04/21/2016   Hyperlipidemia with target LDL less than 100 06/17/2011   Raynaud's disease 06/17/2011    PCP:  Joselyn Arrow, MD  REFERRING PROVIDER: Judi Saa, DO   REFERRING DIAG: [phone] (ICD-10-CM) - Right shoulder pain, unspecified chronicity   THERAPY DIAG:  Right shoulder pain, unspecified chronicity  Muscle weakness (generalized)  Other low back pain  Other abnormalities of gait and mobility  Rationale for Evaluation and Treatment: Rehabilitation  ONSET DATE: Chronic  SUBJECTIVE:                                                                                                                                                                                       SUBJECTIVE STATEMENT: Pt presents to PT with continued LBP. Pt has been compliant with HEP with no adverse effect.   Hand dominance: Right  PERTINENT HISTORY: Lumbar  fusion, R TKA, SBO  PAIN:  Are you having pain?  Yes: NPRS scale: 5/10 Worst: 6/10 Pain location: lower back Pain description: R lower back Aggravating factors: pickle ball, jogging Relieving factors: rest, medication, heat  PRECAUTIONS: None  WEIGHT BEARING RESTRICTIONS: No  FALLS:  Has patient fallen in last 6 months? No  LIVING ENVIRONMENT: Lives with: lives with their family Lives in: House/apartment Stairs: No Has following equipment at home: None  OCCUPATION: Retired  PLOF: Independent  PATIENT GOALS: pt wants to improve her lower back discomfort in order to play pickle ball and exercise with decreased pain  OBJECTIVE:   DIAGNOSTIC FINDINGS:  See imaging  PATIENT SURVEYS:  FOTO: 67% function; 67% predicted  COGNITION: Overall cognitive status: Within functional limits for tasks assessed     SENSATION: WFL  POSTURE: Rounded shoulders, fwd head, reduced lumbar lordosis   UPPER EXTREMITY ROM:   Active ROM Right eval Left eval  Shoulder flexion WNL WNL  Shoulder extension    Shoulder abduction WNL WNL  Shoulder adduction    Shoulder internal rotation    Shoulder external rotation    Elbow flexion    Elbow extension    Wrist flexion    Wrist extension    Wrist ulnar deviation    Wrist radial deviation    Wrist pronation    Wrist supination    (Blank rows = not tested)  UPPER EXTREMITY MMT:  MMT Right eval Left eval  Shoulder flexion WNL WNL  Shoulder extension    Shoulder abduction WNL WNL  Shoulder adduction    Shoulder internal rotation WNL WNL  Shoulder external rotation WNL WNL  Middle trapezius    Lower trapezius    Elbow flexion    Elbow extension    Wrist flexion    Wrist extension    Wrist ulnar deviation    Wrist radial deviation     Wrist pronation    Wrist supination    Grip strength (lbs)    (Blank rows = not tested)  LOWER EXTREMITY ROM:   Active ROM Right eval Left eval  Shoulder flexion 90 WNL  Shoulder extension 2 WFL  (Blank rows = not tested)  SHOULDER SPECIAL TESTS: Impingement tests: DNT SLAP lesions: DNT Instability tests: DNT Rotator cuff assessment: DNT Biceps assessment: DNT  FUNCTIONAL TESTS:  30 Second Sit to Stand: 10 reps  PALPATION:  Significant TTP to lumbar paraspinals, R>L; no overt TTP to R shoulder    TREATMENT: [facility] Adult PT Treatment:                                                DATE: 04/19/2023 Therapeutic Exercise: NuStep lvl 5 UE/LE x 3 min while taking subjective DKTC x 60" Supine pilates SLR 2x10 each Supine PPT x 10 - 5" hold Supine PPT with pilates squeeze 2x10 90/90 hold 2x30" 90/90 alt taps 2x10 Manual Therapy: Skilled palpation of trigger points for TPDN STM to lumbar paraspinals in prone  Trigger Point Dry Needling Treatment: Pre-treatment instruction: Patient instructed on dry needling rationale, procedures, and possible side effects including pain during treatment (achy,cramping feeling), bruising, drop of blood, lightheadedness, nausea, sweating. Patient Consent Given: Yes Education handout provided: No Muscles treated: R lumbar paraspinals  Needle size and number: .30x62mm x 2 Electrical stimulation performed: No Parameters: N/A Treatment response/outcome: Twitch response elicited and Palpable  decrease in muscle tension Post-treatment instructions: Patient instructed to expect possible mild to moderate muscle soreness later today and/or tomorrow. Patient instructed in methods to reduce muscle soreness and to continue prescribed HEP. If patient was dry needled over the lung field, patient was instructed on signs and symptoms of pneumothorax and, however unlikely, to see immediate medical attention should they occur. Patient was also educated on signs  and symptoms of infection and to seek medical attention should they occur. Patient verbalized understanding of these instructions and education.  [facility] Adult PT Treatment:                                                DATE: 04/05/2023 Therapeutic Exercise: Rec bike x 3 min - working on improving R knee flex Supine PPT x 10 - 5" hold Supine PPT with ball 2x10 Supine pilates SLR 2x10 each Supine PPT with clamshell 2x15 GTB DTKC x 45" Manual Therapy: Skilled palpation of trigger points for TPDN STM to lumbar paraspinals in prone  Trigger Point Dry Needling Treatment: Pre-treatment instruction: Patient instructed on dry needling rationale, procedures, and possible side effects including pain during treatment (achy,cramping feeling), bruising, drop of blood, lightheadedness, nausea, sweating. Patient Consent Given: Yes Education handout provided: No Muscles treated: R lumbar paraspinals  Needle size and number: .30x104mm x 2 Electrical stimulation performed: No Parameters: N/A Treatment response/outcome: Twitch response elicited and Palpable decrease in muscle tension Post-treatment instructions: Patient instructed to expect possible mild to moderate muscle soreness later today and/or tomorrow. Patient instructed in methods to reduce muscle soreness and to continue prescribed HEP. If patient was dry needled over the lung field, patient was instructed on signs and symptoms of pneumothorax and, however unlikely, to see immediate medical attention should they occur. Patient was also educated on signs and symptoms of infection and to seek medical attention should they occur. Patient verbalized understanding of these instructions and education.  [facility] Adult PT Treatment:                                                DATE: 03/31/2023 Therapeutic Exercise: NuStep lvl 5 x 5 min while taking subjective Slant board calf stretch 2x30" Supine PPT x 10 - 5" hold Supine PPT with march 2x20 Supine heel slide x  10 - 5" hold Rec bike x 3 min - working on improving R knee flex Manual Therapy: Skilled palpation of trigger points for TPDN STM to lumbar paraspinals in prone  Trigger Point Dry Needling Treatment: Pre-treatment instruction: Patient instructed on dry needling rationale, procedures, and possible side effects including pain during treatment (achy,cramping feeling), bruising, drop of blood, lightheadedness, nausea, sweating. Patient Consent Given: Yes Education handout provided: No Muscles treated: R lumbar paraspinals  Needle size and number: .30x64mm x 2 Electrical stimulation performed: No Parameters: N/A Treatment response/outcome: Twitch response elicited and Palpable decrease in muscle tension Post-treatment instructions: Patient instructed to expect possible mild to moderate muscle soreness later today and/or tomorrow. Patient instructed in methods to reduce muscle soreness and to continue prescribed HEP. If patient was dry needled over the lung field, patient was instructed on signs and symptoms of pneumothorax and, however unlikely, to see immediate medical attention should they occur. Patient was  also educated on signs and symptoms of infection and to seek medical attention should they occur. Patient verbalized understanding of these instructions and education.  [facility] Adult PT Treatment:                                                DATE: 03/24/2023 Therapeutic Exercise: DKTC x 60" Supine PPT x 5 - 5" hold LTR x 5 each Manual Therapy: STM to lumbar paraspinals in prone   PATIENT EDUCATION: Education details: continue HEP Person educated: Patient Education method: Explanation, Demonstration, and Handouts Education comprehension: verbalized understanding and returned demonstration  HOME EXERCISE PROGRAM: Access Code: [email or web address] URL: [email or web address] Date: 03/23/2023 Prepared by: Edwinna Areola  Exercises - Supine Double Knee to Chest  - 7 x weekly - 3 sets -  60 sec hold - Supine Posterior Pelvic Tilt  - 1 x daily - 7 x weekly - 2 sets - 10 reps - 5 sec hold - Supine Lower Trunk Rota tion  - 1 x daily - 7 x weekly - 2 sets - 10 reps - 5 sec hold  ASSESSMENT:  CLINICAL IMPRESSION: Pt was able to complete all prescribed exercises with no adverse effect. Therapy focused on improving core strength and R knee mobility to decrease LBP. She responded well to TPDN and manual therapy, noting decrease in pain post session. Pt continues to benefit from skilled PT, will continue per POC   OBJECTIVE IMPAIRMENTS: Abnormal gait, decreased balance, decreased cognition, decreased endurance, decreased mobility, difficulty walking, decreased ROM, decreased strength, postural dysfunction, and pain.   ACTIVITY LIMITATIONS: carrying, lifting, bending, standing, squatting, stairs, and transfers  PARTICIPATION LIMITATIONS: community activity, yard work, and recreational activity  PERSONAL FACTORS: Time since onset of injury/illness/exacerbation and 3+ comorbidities: Lumbar fusion, R TKA, SBO  are also affecting patient's functional outcome.    GOALS: Goals reviewed with patient? No  SHORT TERM GOALS: Target date: 04/13/2023   Pt will be compliant and knowledgeable with initial HEP for improved comfort and carryover Baseline: initial HEP given  Goal status: INITIAL  2.  Pt will self report back pain no greater than 4/10 for improved comfort and functional ability Baseline: 6/10 at worst Goal status: INITIAL   LONG TERM GOALS: Target date: 05/18/2023   Pt will improve FOTO function score to no less than 75% as proxy for functional improvement Baseline: 67% function Goal status: INITIAL  2.  Pt will self report back pain no greater than 1-2/10 for improved comfort and functional ability Baseline: 6/10 at worst Goal status: INITIAL   3.  Pt will increase 30 Second Sit to Stand rep count to no less than 12 reps for improved balance, strength, and functional  mobility Baseline: 10 reps  Goal status: INITIAL   4.  Pt will improve R knee flexion to no less than 100 degrees for improved mobility and decreased stress to lower back Baseline: see ROM chart Goal status: INITIAL   PLAN:  PT FREQUENCY: 1-2x/week  PT DURATION: 8 weeks  PLANNED INTERVENTIONS: Therapeutic exercises, Therapeutic activity, Neuromuscular re-education, Balance training, Gait training, Patient/Family education, Self Care, Joint mobilization, Aquatic Therapy, Dry Needling, Cryotherapy, Moist heat, Vasopneumatic device, Manual therapy, and Re-evaluation  PLAN FOR NEXT SESSION: assess HEP response, core strengthening, TPDN and massage    Eloy End, PT 04/19/2023, 2:42 PM

## 2023-04-21 ENCOUNTER — Ambulatory Visit: Payer: Medicare Other

## 2023-04-21 DIAGNOSIS — M6281 Muscle weakness (generalized): Secondary | ICD-10-CM | POA: Diagnosis not present

## 2023-04-21 DIAGNOSIS — R2689 Other abnormalities of gait and mobility: Secondary | ICD-10-CM | POA: Diagnosis not present

## 2023-04-21 DIAGNOSIS — M25511 Pain in right shoulder: Secondary | ICD-10-CM

## 2023-04-21 DIAGNOSIS — M5459 Other low back pain: Secondary | ICD-10-CM | POA: Diagnosis not present

## 2023-04-21 NOTE — Therapy (Signed)
OUTPATIENT PHYSICAL THERAPY TREATMENT   Patient Name: Bethany [name] MRN: [id number] DOB:06-Oct-1956, 68 y.o., female Today's Date: 04/21/2023  END OF SESSION:  PT End of Session - 04/21/23 1356     Visit Number 5    Number of Visits 17    Date for PT Re-Evaluation 05/19/23    Authorization Type BC[employer] MCR    PT Start Time 1400    PT Stop Time 1440    PT Time Calculation (min) 40 min    Activity Tolerance Patient tolerated treatment well    Behavior During Therapy WFL for tasks assessed/performed                 Past Medical History:  Diagnosis Date   ADHD (attention deficit hyperactivity disorder) 09/13/2018   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 All Paps normal afterwards   Degenerative disc disease, lumbar 05/02/2020   Dyslipidemia    Generalized anxiety disorder    Herpes genitalis    Herpes labialis    History of calcium pyrophosphate deposition disease (CPPD) 03/26/2020   History of opiate abuse, in remission    History of small bowel obstruction 04/17/2018   partial   Hyperlipidemia with target LDL less than 100 06/17/2011   IBS (irritable bowel syndrome)    Lumbago with sciatica, right side 02/10/2021   Major depressive disorder    Meibomian gland dysfunction (MGD) of both eyes 08/02/2020   Narcotic bowel syndrome (HCC)    Nonallopathic lesion of lumbosacral region 08/05/2017   Nonallopathic lesion of sacral region 08/05/2017   Nonallopathic lesion of thoracic region 08/05/2017   Osteopenia 04/2014   T score -1.7 FRAX 7.3%/0.8% stable from prior DEXA 2013   Raynaud's disease 06/17/2011   Recovering alcoholic in remission     S/P LASIK (laser assisted in situ keratomileusis) 08/02/2020   right eye   Spondylolisthesis of lumbar region 02/10/2021   Status post total right  knee replacement 10/31/2019   Tubular adenoma of colon 04/21/2016   Past Surgical History:  Procedure Laterality Date   AUGMENTATION MAMMAPLASTY     BREAST ENHANCEMENT SURGERY  2006   COSMETIC SURGERY     EYE SURGERY     lasik od   GYNECOLOGIC CRYOSURGERY  1990   LAPAROTOMY N/A 04/22/2018   Procedure: EXPLORATORY LAPAROTOMY;  Surgeon: Harriette Bouillon, MD;  Location: [facility];  Service: General;  Laterality: N/A;   LIGAMENT REPAIR Right 09/24/2015   Procedure: RIGHT INDEX METACARPAL PHALANGEAL RADIAL COLLATERAL LIGAMENT REPAIR;  Surgeon: Betha Loa, MD;  Location: [facility];  Service: Orthopedics;  Laterality: Right;   MASS EXCISION N/A 08/04/2018   Procedure: REMOVAL OF SUTURE FROM ABDOMEN/ERAS PATHWAY;  Surgeon: Harriette Bouillon, MD;  Location: [facility];  Service: General;  Laterality: N/A;   OOPHORECTOMY     BSO   Right knee arthoscopy Right 09/09/2017   [facility]   SHX 1 Left 02/19/2020   basel cell carcinoma   SHX1 Right 12/11/2020   basal cell carinoma,micronodular pattern   SKIN BIOPSY Right 02/28/2019   Superficial basal carcinoma    SKIN BIOPSY Left 11/24/2021   superficial basal carcinoma of the left forearm   TONSILLECTOMY AND ADENOIDECTOMY     TOTAL KNEE ARTHROPLASTY Right 04/12/2018   TOTAL KNEE ARTHROPLASTY Right 04/12/2018   Procedure: RIGHT  TOTAL KNEE ARTHROPLASTY;  Surgeon: Marcene Corning, MD;  Location: [facility];  Service: Orthopedics;  Laterality: Right;   TRANSFORAMINAL LUMBAR INTERBODY FUSION W/ MIS 1 LEVEL N/A 09/03/2021   Procedure: Lumbar four-five Minimally invasive transforaminal lumbar interbody fusion;  Surgeon: Dawley, Alan Mulder, DO;  Location: [facility];  Service: Neurosurgery;  Laterality: N/A;   TUBAL LIGATION     UPPER GASTROINTESTINAL ENDOSCOPY  04/24/2005   VAGINAL HYSTERECTOMY  2005   TVH BSO  adenomyosis   Patient Active Problem List   Diagnosis Date Noted   Left rotator cuff tear 04/01/2023   Seroma due to  trauma (HCC) 02/26/2023   Acute bursitis of right shoulder 02/26/2023   Basal cell carcinoma 01/25/2023   Encounter for counseling 01/05/2023   Small bowel obstruction (HCC) 06/16/2022   History of hysterectomy 04/23/2022   Squamous cell carcinoma in situ (SCCIS) of skin of left thigh 08/27/2021   History of opiate abuse, in remission    Spondylolisthesis of lumbar region 02/10/2021   Lumbago with sciatica, right side 02/10/2021   Recovering alcoholic in remission     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 08/02/2020   Meibomian gland dysfunction (MGD) of both eyes 08/02/2020   S/P LASIK (laser assisted in situ keratomileusis) 08/02/2020   Degenerative disc disease, lumbar 05/02/2020   History of calcium pyrophosphate deposition disease (CPPD) 03/26/2020   Arthritis of carpometacarpal (CMC) joint of left thumb 01/18/2020   Status post total right knee replacement 10/31/2019   ADHD (attention deficit hyperactivity disorder) 09/13/2018   IBS (irritable bowel syndrome) 04/17/2018   History of small bowel obstruction 04/17/2018   Nonallopathic lesion of thoracic region 08/05/2017   Nonallopathic lesion of lumbosacral region 08/05/2017   Nonallopathic lesion of sacral region 08/05/2017   Tubular adenoma of colon 04/21/2016   Hyperlipidemia with target LDL less than 100 06/17/2011   Raynaud's disease 06/17/2011    PCP:  Joselyn Arrow, MD  REFERRING PROVIDER: Judi Saa, DO   REFERRING DIAG: [phone] (ICD-10-CM) - Right shoulder pain, unspecified chronicity   THERAPY DIAG:  Right shoulder pain, unspecified chronicity  Muscle weakness (generalized)  Rationale for Evaluation and Treatment: Rehabilitation  ONSET DATE: Chronic  SUBJECTIVE:                                                                                                                                                                                      SUBJECTIVE STATEMENT: Pt presents to PT with  reports of decreased back pain compared to last session. Notes decreased pain after last session. Has continued HEP compliance.   Hand dominance: Right  PERTINENT HISTORY: Lumbar fusion, R TKA, SBO  PAIN:  Are you having pain?  Yes: NPRS scale: 5/10 Worst: 6/10 Pain location: lower back Pain description: R lower back Aggravating factors: pickle ball, jogging Relieving factors: rest, medication, heat  PRECAUTIONS: None  WEIGHT BEARING RESTRICTIONS: No  FALLS:  Has patient fallen in last 6 months? No  LIVING ENVIRONMENT: Lives with: lives with their family Lives in: House/apartment Stairs: No Has following equipment at home: None  OCCUPATION: Retired  PLOF: Independent  PATIENT GOALS: pt wants to improve her lower back discomfort in order to play pickle ball and exercise with decreased pain  OBJECTIVE:   DIAGNOSTIC FINDINGS:  See imaging  PATIENT SURVEYS:  FOTO: 67% function; 67% predicted  COGNITION: Overall cognitive status: Within functional limits for tasks assessed     SENSATION: WFL  POSTURE: Rounded shoulders, fwd head, reduced lumbar lordosis   UPPER EXTREMITY ROM:   Active ROM Right eval Left eval  Shoulder flexion WNL WNL  Shoulder extension    Shoulder abduction WNL WNL  Shoulder adduction    Shoulder internal rotation    Shoulder external rotation    Elbow flexion    Elbow extension    Wrist flexion    Wrist extension    Wrist ulnar deviation    Wrist radial deviation    Wrist pronation    Wrist supination    (Blank rows = not tested)  UPPER EXTREMITY MMT:  MMT Right eval Left eval  Shoulder flexion WNL WNL  Shoulder extension    Shoulder abduction WNL WNL  Shoulder adduction    Shoulder internal rotation WNL WNL  Shoulder external rotation WNL WNL  Middle trapezius    Lower trapezius    Elbow flexion    Elbow extension    Wrist flexion    Wrist extension    Wrist ulnar deviation    Wrist radial deviation    Wrist  pronation    Wrist supination    Grip strength (lbs)    (Blank rows = not tested)  LOWER EXTREMITY ROM:   Active ROM Right eval Left eval  Shoulder flexion 90 WNL  Shoulder extension 2 WFL  (Blank rows = not tested)  SHOULDER SPECIAL TESTS: Impingement tests: DNT SLAP lesions: DNT Instability tests: DNT Rotator cuff assessment: DNT Biceps assessment: DNT  FUNCTIONAL TESTS:  30 Second Sit to Stand: 10 reps  PALPATION:  Significant TTP to lumbar paraspinals, R>L; no overt TTP to R shoulder    TREATMENT: [facility] Adult PT Treatment:                                                DATE: 04/21/2023 Therapeutic Exercise: NuStep lvl 5 UE/LE x 3 min while taking subjective DKTC x 60" Supine pilates SLR 2x15 each Supine PPT x 10 - 5" hold Supine PPT with pilates squeeze 2x10 Manual Therapy: Skilled palpation of trigger points for TPDN STM to lumbar paraspinals in prone  Trigger Point Dry Needling Treatment: Pre-treatment instruction: Patient instructed on dry needling rationale, procedures, and possible side effects including pain during treatment (achy,cramping feeling), bruising, drop of blood, lightheadedness, nausea, sweating. Patient Consent Given: Yes Education handout provided: No Muscles treated: R lumbar paraspinals  Needle size and number: .30x38mm x 2 Electrical stimulation performed: No Parameters: N/A Treatment response/outcome: Twitch response elicited and Palpable decrease in muscle tension Post-treatment instructions: Patient instructed to expect possible mild  to moderate muscle soreness later today and/or tomorrow. Patient instructed in methods to reduce muscle soreness and to continue prescribed HEP. If patient was dry needled over the lung field, patient was instructed on signs and symptoms of pneumothorax and, however unlikely, to see immediate medical attention should they occur. Patient was also educated on signs and symptoms of infection and to seek medical  attention should they occur. Patient verbalized understanding of these instructions and education.  [facility] Adult PT Treatment:                                                DATE: 04/19/2023 Therapeutic Exercise: NuStep lvl 5 UE/LE x 3 min while taking subjective DKTC x 60" Supine pilates SLR 2x10 each Supine PPT x 10 - 5" hold Supine PPT with pilates squeeze 2x10 90/90 hold 2x30" 90/90 alt taps 2x10 Manual Therapy: Skilled palpation of trigger points for TPDN STM to lumbar paraspinals in prone Trigger point release to bilateral QL  Trigger Point Dry Needling Treatment: Pre-treatment instruction: Patient instructed on dry needling rationale, procedures, and possible side effects including pain during treatment (achy,cramping feeling), bruising, drop of blood, lightheadedness, nausea, sweating. Patient Consent Given: Yes Education handout provided: No Muscles treated: R lumbar paraspinals  Needle size and number: .30x75mm x 2 Electrical stimulation performed: No Parameters: N/A Treatment response/outcome: Twitch response elicited and Palpable decrease in muscle tension Post-treatment instructions: Patient instructed to expect possible mild to moderate muscle soreness later today and/or tomorrow. Patient instructed in methods to reduce muscle soreness and to continue prescribed HEP. If patient was dry needled over the lung field, patient was instructed on signs and symptoms of pneumothorax and, however unlikely, to see immediate medical attention should they occur. Patient was also educated on signs and symptoms of infection and to seek medical attention should they occur. Patient verbalized understanding of these instructions and education.  [facility] Adult PT Treatment:                                                DATE: 04/05/2023 Therapeutic Exercise: Rec bike x 3 min - working on improving R knee flex Supine PPT x 10 - 5" hold Supine PPT with ball 2x10 Supine pilates SLR 2x10 each Supine  PPT with clamshell 2x15 GTB DTKC x 45" Manual Therapy: Skilled palpation of trigger points for TPDN STM to lumbar paraspinals in prone  Trigger Point Dry Needling Treatment: Pre-treatment instruction: Patient instructed on dry needling rationale, procedures, and possible side effects including pain during treatment (achy,cramping feeling), bruising, drop of blood, lightheadedness, nausea, sweating. Patient Consent Given: Yes Education handout provided: No Muscles treated: R lumbar paraspinals  Needle size and number: .30x8mm x 2 Electrical stimulation performed: No Parameters: N/A Treatment response/outcome: Twitch response elicited and Palpable decrease in muscle tension Post-treatment instructions: Patient instructed to expect possible mild to moderate muscle soreness later today and/or tomorrow. Patient instructed in methods to reduce muscle soreness and to continue prescribed HEP. If patient was dry needled over the lung field, patient was instructed on signs and symptoms of pneumothorax and, however unlikely, to see immediate medical attention should they occur. Patient was also educated on signs and symptoms of infection and to seek medical attention should  they occur. Patient verbalized understanding of these instructions and education.  [facility] Adult PT Treatment:                                                DATE: 03/31/2023 Therapeutic Exercise: NuStep lvl 5 x 5 min while taking subjective Slant board calf stretch 2x30" Supine PPT x 10 - 5" hold Supine PPT with march 2x20 Supine heel slide x 10 - 5" hold Rec bike x 3 min - working on improving R knee flex Manual Therapy: Skilled palpation of trigger points for TPDN STM to lumbar paraspinals in prone  Trigger Point Dry Needling Treatment: Pre-treatment instruction: Patient instructed on dry needling rationale, procedures, and possible side effects including pain during treatment (achy,cramping feeling), bruising, drop of blood,  lightheadedness, nausea, sweating. Patient Consent Given: Yes Education handout provided: No Muscles treated: R lumbar paraspinals  Needle size and number: .30x67mm x 2 Electrical stimulation performed: No Parameters: N/A Treatment response/outcome: Twitch response elicited and Palpable decrease in muscle tension Post-treatment instructions: Patient instructed to expect possible mild to moderate muscle soreness later today and/or tomorrow. Patient instructed in methods to reduce muscle soreness and to continue prescribed HEP. If patient was dry needled over the lung field, patient was instructed on signs and symptoms of pneumothorax and, however unlikely, to see immediate medical attention should they occur. Patient was also educated on signs and symptoms of infection and to seek medical attention should they occur. Patient verbalized understanding of these instructions and education.  [facility] Adult PT Treatment:                                                DATE: 03/24/2023 Therapeutic Exercise: DKTC x 60" Supine PPT x 5 - 5" hold LTR x 5 each Manual Therapy: STM to lumbar paraspinals in prone   PATIENT EDUCATION: Education details: continue HEP Person educated: Patient Education method: Explanation, Demonstration, and Handouts Education comprehension: verbalized understanding and returned demonstration  HOME EXERCISE PROGRAM: Access Code: [email or web address] URL: [email or web address] Date: 04/21/2023 Prepared by: Edwinna Areola  Exercises - Supine Double Knee to Chest  - 7 x weekly - 3 sets - 60 sec hold - Supine Posterior Pelvic Tilt  - 1 x daily - 7 x weekly - 2 sets - 10 reps - 5 sec hold - Supine Lower Trunk Rotation  - 1 x daily - 7 x weekly - 2 sets - 10 reps - 5 sec hold - Supine 90/90 Alternating Heel Touches with Posterior Pelvic Tilt  - 1 x daily - 7 x weekly - 2 sets - 10 reps  ASSESSMENT:  CLINICAL IMPRESSION: Pt was able to complete all prescribed exercises with  no adverse effect. Therapy focused on improving core strength and R knee mobility to decrease LBP. She responded well to TPDN and manual therapy, noting decrease in pain post session. Pt continues to benefit from skilled PT, will continue per POC   OBJECTIVE IMPAIRMENTS: Abnormal gait, decreased balance, decreased cognition, decreased endurance, decreased mobility, difficulty walking, decreased ROM, decreased strength, postural dysfunction, and pain.   ACTIVITY LIMITATIONS: carrying, lifting, bending, standing, squatting, stairs, and transfers  PARTICIPATION LIMITATIONS: community activity, yard work, and recreational activity  PERSONAL FACTORS: Time since onset  of injury/illness/exacerbation and 3+ comorbidities: Lumbar fusion, R TKA, SBO  are also affecting patient's functional outcome.    GOALS: Goals reviewed with patient? No  SHORT TERM GOALS: Target date: 04/13/2023   Pt will be compliant and knowledgeable with initial HEP for improved comfort and carryover Baseline: initial HEP given  Goal status: INITIAL  2.  Pt will self report back pain no greater than 4/10 for improved comfort and functional ability Baseline: 6/10 at worst Goal status: INITIAL   LONG TERM GOALS: Target date: 05/18/2023   Pt will improve FOTO function score to no less than 75% as proxy for functional improvement Baseline: 67% function Goal status: INITIAL  2.  Pt will self report back pain no greater than 1-2/10 for improved comfort and functional ability Baseline: 6/10 at worst Goal status: INITIAL   3.  Pt will increase 30 Second Sit to Stand rep count to no less than 12 reps for improved balance, strength, and functional mobility Baseline: 10 reps  Goal status: INITIAL   4.  Pt will improve R knee flexion to no less than 100 degrees for improved mobility and decreased stress to lower back Baseline: see ROM chart Goal status: INITIAL   PLAN:  PT FREQUENCY: 1-2x/week  PT DURATION: 8  weeks  PLANNED INTERVENTIONS: Therapeutic exercises, Therapeutic activity, Neuromuscular re-education, Balance training, Gait training, Patient/Family education, Self Care, Joint mobilization, Aquatic Therapy, Dry Needling, Cryotherapy, Moist heat, Vasopneumatic device, Manual therapy, and Re-evaluation  PLAN FOR NEXT SESSION: assess HEP response, core strengthening, TPDN and massage    Eloy End, PT 04/21/2023, 2:47 PM

## 2023-04-26 ENCOUNTER — Ambulatory Visit: Payer: Medicare Other

## 2023-04-26 DIAGNOSIS — M25511 Pain in right shoulder: Secondary | ICD-10-CM

## 2023-04-26 DIAGNOSIS — R2689 Other abnormalities of gait and mobility: Secondary | ICD-10-CM | POA: Diagnosis not present

## 2023-04-26 DIAGNOSIS — M6281 Muscle weakness (generalized): Secondary | ICD-10-CM | POA: Diagnosis not present

## 2023-04-26 DIAGNOSIS — M5459 Other low back pain: Secondary | ICD-10-CM | POA: Diagnosis not present

## 2023-04-26 NOTE — Therapy (Signed)
OUTPATIENT PHYSICAL THERAPY TREATMENT   Patient Name: Bethany [name] MRN: [id number] DOB:Dec 06, 1955, 67 y.o., female Today's Date: 04/27/2023  END OF SESSION:  PT End of Session - 04/26/23 1615     Visit Number 6    Number of Visits 17    Date for PT Re-Evaluation 05/19/23    Authorization Type BCBS MCR    PT Start Time 1615    PT Stop Time 1655    PT Time Calculation (min) 40 min    Activity Tolerance Patient tolerated treatment well    Behavior During Therapy WFL for tasks assessed/performed                  Past Medical History:  Diagnosis Date   ADHD (attention deficit hyperactivity disorder) 09/13/2018   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 All Paps normal afterwards   Degenerative disc disease, lumbar 05/02/2020   Dyslipidemia    Generalized anxiety disorder    Herpes genitalis    Herpes labialis    History of calcium pyrophosphate deposition disease (CPPD) 03/26/2020   History of opiate abuse, in remission    History of small bowel obstruction 04/17/2018   partial   Hyperlipidemia with target LDL less than 100 06/17/2011   IBS (irritable bowel syndrome)    Lumbago with sciatica, right side 02/10/2021   Major depressive disorder    Meibomian gland dysfunction (MGD) of both eyes 08/02/2020   Narcotic bowel syndrome (HCC)    Nonallopathic lesion of lumbosacral region 08/05/2017   Nonallopathic lesion of sacral region 08/05/2017   Nonallopathic lesion of thoracic region 08/05/2017   Osteopenia 04/2014   T score -1.7 FRAX 7.3%/0.8% stable from prior DEXA 2013   Raynaud's disease 06/17/2011   Recovering alcoholic in remission     S/P LASIK (laser assisted in situ keratomileusis) 08/02/2020   right eye   Spondylolisthesis of lumbar region 02/10/2021   Status post total  right knee replacement 10/31/2019   Tubular adenoma of colon 04/21/2016   Past Surgical History:  Procedure Laterality Date   AUGMENTATION MAMMAPLASTY     BREAST ENHANCEMENT SURGERY  2006   COSMETIC SURGERY     EYE SURGERY     lasik od   GYNECOLOGIC CRYOSURGERY  1990   LAPAROTOMY N/A 04/22/2018   Procedure: EXPLORATORY LAPAROTOMY;  Surgeon: Harriette Bouillon, MD;  Location: [facility];  Service: General;  Laterality: N/A;   LIGAMENT REPAIR Right 09/24/2015   Procedure: RIGHT INDEX METACARPAL PHALANGEAL RADIAL COLLATERAL LIGAMENT REPAIR;  Surgeon: Betha Loa, MD;  Location: [facility];  Service: Orthopedics;  Laterality: Right;   MASS EXCISION N/A 08/04/2018   Procedure: REMOVAL OF SUTURE FROM ABDOMEN/ERAS PATHWAY;  Surgeon: Harriette Bouillon, MD;  Location: [facility];  Service: General;  Laterality: N/A;   OOPHORECTOMY     BSO   Right knee arthoscopy Right 09/09/2017   [facility]   SHX 1 Left 02/19/2020   basel cell carcinoma   SHX1 Right 12/11/2020   basal cell carinoma,micronodular pattern   SKIN BIOPSY Right 02/28/2019   Superficial basal carcinoma    SKIN BIOPSY Left 11/24/2021   superficial basal carcinoma of the left forearm   TONSILLECTOMY AND ADENOIDECTOMY     TOTAL KNEE ARTHROPLASTY Right 04/12/2018   TOTAL KNEE ARTHROPLASTY Right 04/12/2018   Procedure:  RIGHT TOTAL KNEE ARTHROPLASTY;  Surgeon: Marcene Corning, MD;  Location: [facility];  Service: Orthopedics;  Laterality: Right;   TRANSFORAMINAL LUMBAR INTERBODY FUSION W/ MIS 1 LEVEL N/A 09/03/2021   Procedure: Lumbar four-five Minimally invasive transforaminal lumbar interbody fusion;  Surgeon: Dawley, Alan Mulder, DO;  Location: [facility];  Service: Neurosurgery;  Laterality: N/A;   TUBAL LIGATION     UPPER GASTROINTESTINAL ENDOSCOPY  04/24/2005   VAGINAL HYSTERECTOMY  2005   TVH BSO  adenomyosis   Patient Active Problem List   Diagnosis Date Noted   Left rotator cuff tear 04/01/2023   Seroma due to  trauma (HCC) 02/26/2023   Acute bursitis of right shoulder 02/26/2023   Basal cell carcinoma 01/25/2023   Encounter for counseling 01/05/2023   Small bowel obstruction (HCC) 06/16/2022   History of hysterectomy 04/23/2022   Squamous cell carcinoma in situ (SCCIS) of skin of left thigh 08/27/2021   History of opiate abuse, in remission    Spondylolisthesis of lumbar region 02/10/2021   Lumbago with sciatica, right side 02/10/2021   Recovering alcoholic in remission     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 08/02/2020   Meibomian gland dysfunction (MGD) of both eyes 08/02/2020   S/P LASIK (laser assisted in situ keratomileusis) 08/02/2020   Degenerative disc disease, lumbar 05/02/2020   History of calcium pyrophosphate deposition disease (CPPD) 03/26/2020   Arthritis of carpometacarpal (CMC) joint of left thumb 01/18/2020   Status post total right knee replacement 10/31/2019   ADHD (attention deficit hyperactivity disorder) 09/13/2018   IBS (irritable bowel syndrome) 04/17/2018   History of small bowel obstruction 04/17/2018   Nonallopathic lesion of thoracic region 08/05/2017   Nonallopathic lesion of lumbosacral region 08/05/2017   Nonallopathic lesion of sacral region 08/05/2017   Tubular adenoma of colon 04/21/2016   Hyperlipidemia with target LDL less than 100 06/17/2011   Raynaud's disease 06/17/2011    PCP:  Joselyn Arrow, MD  REFERRING PROVIDER: Judi Saa, DO   REFERRING DIAG: [phone] (ICD-10-CM) - Right shoulder pain, unspecified chronicity   THERAPY DIAG:  Right shoulder pain, unspecified chronicity  Muscle weakness (generalized)  Rationale for Evaluation and Treatment: Rehabilitation  ONSET DATE: Chronic  SUBJECTIVE:                                                                                                                                                                                      SUBJECTIVE STATEMENT: Pt presents to PT with  reports of no current back pain. Has been compliant with HEP.   Hand dominance: Right  PERTINENT HISTORY: Lumbar fusion, R TKA, SBO  PAIN:  Are you having pain?  Yes: NPRS scale: 0/10 Worst: 6/10 Pain location: lower back Pain description: R lower back Aggravating factors: pickle ball, jogging Relieving factors: rest, medication, heat  PRECAUTIONS: None  WEIGHT BEARING RESTRICTIONS: No  FALLS:  Has patient fallen in last 6 months? No  LIVING ENVIRONMENT: Lives with: lives with their family Lives in: House/apartment Stairs: No Has following equipment at home: None  OCCUPATION: Retired  PLOF: Independent  PATIENT GOALS: pt wants to improve her lower back discomfort in order to play pickle ball and exercise with decreased pain  OBJECTIVE:   DIAGNOSTIC FINDINGS:  See imaging  PATIENT SURVEYS:  FOTO: 67% function; 67% predicted  COGNITION: Overall cognitive status: Within functional limits for tasks assessed     SENSATION: WFL  POSTURE: Rounded shoulders, fwd head, reduced lumbar lordosis   UPPER EXTREMITY ROM:   Active ROM Right eval Left eval  Shoulder flexion WNL WNL  Shoulder extension    Shoulder abduction WNL WNL  Shoulder adduction    Shoulder internal rotation    Shoulder external rotation    Elbow flexion    Elbow extension    Wrist flexion    Wrist extension    Wrist ulnar deviation    Wrist radial deviation    Wrist pronation    Wrist supination    (Blank rows = not tested)  UPPER EXTREMITY MMT:  MMT Right eval Left eval  Shoulder flexion WNL WNL  Shoulder extension    Shoulder abduction WNL WNL  Shoulder adduction    Shoulder internal rotation WNL WNL  Shoulder external rotation WNL WNL  Middle trapezius    Lower trapezius    Elbow flexion    Elbow extension    Wrist flexion    Wrist extension    Wrist ulnar deviation    Wrist radial deviation    Wrist pronation    Wrist supination    Grip strength (lbs)    (Blank  rows = not tested)  LOWER EXTREMITY ROM:   Active ROM Right eval Left eval  Shoulder flexion 90 WNL  Shoulder extension 2 WFL  (Blank rows = not tested)  SHOULDER SPECIAL TESTS: Impingement tests: DNT SLAP lesions: DNT Instability tests: DNT Rotator cuff assessment: DNT Biceps assessment: DNT  FUNCTIONAL TESTS:  30 Second Sit to Stand: 10 reps  PALPATION:  Significant TTP to lumbar paraspinals, R>L; no overt TTP to R shoulder    TREATMENT: [facility] Adult PT Treatment:                                                DATE: 04/26/2023 Therapeutic Exercise: NuStep lvl 5 UE/LE x 3 min while taking subjective Supine pilates SLR 2x15 each Supine PPT x 10 - 5" hold 90/90 hold x 30" 90/90 alt heel tap 2x10 Deadbug 2x10 Manual Therapy: Skilled palpation of trigger points for TPDN STM to lumbar paraspinals in prone  Trigger Point Dry Needling Treatment: Pre-treatment instruction: Patient instructed on dry needling rationale, procedures, and possible side effects including pain during treatment (achy,cramping feeling), bruising, drop of blood, lightheadedness, nausea, sweating. Patient Consent Given: Yes Education handout provided: No Muscles treated: R lumbar paraspinals  Needle size and number: .30x50mm x 2 Electrical stimulation performed: No Parameters: N/A Treatment response/outcome: Twitch response elicited and Palpable decrease in muscle tension Post-treatment instructions: Patient instructed to expect possible mild to moderate muscle soreness later  today and/or tomorrow. Patient instructed in methods to reduce muscle soreness and to continue prescribed HEP. If patient was dry needled over the lung field, patient was instructed on signs and symptoms of pneumothorax and, however unlikely, to see immediate medical attention should they occur. Patient was also educated on signs and symptoms of infection and to seek medical attention should they occur. Patient verbalized understanding  of these instructions and education.  [facility] Adult PT Treatment:                                                DATE: 04/21/2023 Therapeutic Exercise: NuStep lvl 5 UE/LE x 3 min while taking subjective DKTC x 60" Supine pilates SLR 2x15 each Supine PPT x 10 - 5" hold Supine PPT with pilates squeeze 2x10 Manual Therapy: Skilled palpation of trigger points for TPDN STM to lumbar paraspinals in prone  Trigger Point Dry Needling Treatment: Pre-treatment instruction: Patient instructed on dry needling rationale, procedures, and possible side effects including pain during treatment (achy,cramping feeling), bruising, drop of blood, lightheadedness, nausea, sweating. Patient Consent Given: Yes Education handout provided: No Muscles treated: R lumbar paraspinals  Needle size and number: .30x59mm x 2 Electrical stimulation performed: No Parameters: N/A Treatment response/outcome: Twitch response elicited and Palpable decrease in muscle tension Post-treatment instructions: Patient instructed to expect possible mild to moderate muscle soreness later today and/or tomorrow. Patient instructed in methods to reduce muscle soreness and to continue prescribed HEP. If patient was dry needled over the lung field, patient was instructed on signs and symptoms of pneumothorax and, however unlikely, to see immediate medical attention should they occur. Patient was also educated on signs and symptoms of infection and to seek medical attention should they occur. Patient verbalized understanding of these instructions and education.  [facility] Adult PT Treatment:                                                DATE: 04/19/2023 Therapeutic Exercise: NuStep lvl 5 UE/LE x 3 min while taking subjective DKTC x 60" Supine pilates SLR 2x10 each Supine PPT x 10 - 5" hold Supine PPT with pilates squeeze 2x10 90/90 hold 2x30" 90/90 alt taps 2x10 Manual Therapy: Skilled palpation of trigger points for TPDN STM to lumbar  paraspinals in prone Trigger point release to bilateral QL  Trigger Point Dry Needling Treatment: Pre-treatment instruction: Patient instructed on dry needling rationale, procedures, and possible side effects including pain during treatment (achy,cramping feeling), bruising, drop of blood, lightheadedness, nausea, sweating. Patient Consent Given: Yes Education handout provided: No Muscles treated: R lumbar paraspinals  Needle size and number: .30x68mm x 2 Electrical stimulation performed: No Parameters: N/A Treatment response/outcome: Twitch response elicited and Palpable decrease in muscle tension Post-treatment instructions: Patient instructed to expect possible mild to moderate muscle soreness later today and/or tomorrow. Patient instructed in methods to reduce muscle soreness and to continue prescribed HEP. If patient was dry needled over the lung field, patient was instructed on signs and symptoms of pneumothorax and, however unlikely, to see immediate medical attention should they occur. Patient was also educated on signs and symptoms of infection and to seek medical attention should they occur. Patient verbalized understanding of these instructions and education.  [facility]  Adult PT Treatment:                                                DATE: 04/05/2023 Therapeutic Exercise: Rec bike x 3 min - working on improving R knee flex Supine PPT x 10 - 5" hold Supine PPT with ball 2x10 Supine pilates SLR 2x10 each Supine PPT with clamshell 2x15 GTB DTKC x 45" Manual Therapy: Skilled palpation of trigger points for TPDN STM to lumbar paraspinals in prone  Trigger Point Dry Needling Treatment: Pre-treatment instruction: Patient instructed on dry needling rationale, procedures, and possible side effects including pain during treatment (achy,cramping feeling), bruising, drop of blood, lightheadedness, nausea, sweating. Patient Consent Given: Yes Education handout provided: No Muscles treated: R  lumbar paraspinals  Needle size and number: .30x43mm x 2 Electrical stimulation performed: No Parameters: N/A Treatment response/outcome: Twitch response elicited and Palpable decrease in muscle tension Post-treatment instructions: Patient instructed to expect possible mild to moderate muscle soreness later today and/or tomorrow. Patient instructed in methods to reduce muscle soreness and to continue prescribed HEP. If patient was dry needled over the lung field, patient was instructed on signs and symptoms of pneumothorax and, however unlikely, to see immediate medical attention should they occur. Patient was also educated on signs and symptoms of infection and to seek medical attention should they occur. Patient verbalized understanding of these instructions and education.  PATIENT EDUCATION: Education details: continue HEP Person educated: Patient Education method: Explanation, Demonstration, and Handouts Education comprehension: verbalized understanding and returned demonstration  HOME EXERCISE PROGRAM: Access Code: [email or web address] URL: [email or web address] Date: 04/27/2023 Prepared by: Edwinna Areola  Exercises - Supine Double Knee to Chest  - 7 x weekly - 3 sets - 60 sec hold - Supine Posterior Pelvic Tilt  - 1 x daily - 7 x weekly - 2 sets - 10 reps - 5 sec hold - Supine Lower Trunk Rotation  - 1 x daily - 7 x weekly - 2 sets - 10 reps - 5 sec hold - Supine 90/90 Alternating Heel Touches with Posterior Pelvic Tilt  - 1 x daily - 7 x weekly - 2 sets - 10 reps - Supine Dead Bug with Leg Extension  - 1 x daily - 7 x weekly - 2 sets - 10 reps  ASSESSMENT:  CLINICAL IMPRESSION: Pt was able to complete all prescribed exercises with no adverse effect. Therapy focused on improving core strength and R knee mobility to decrease LBP. She responded well to TPDN and manual therapy, noting decrease in pain post session. Pt continues to benefit from skilled PT, will continue per POC.    OBJECTIVE IMPAIRMENTS: Abnormal gait, decreased balance, decreased cognition, decreased endurance, decreased mobility, difficulty walking, decreased ROM, decreased strength, postural dysfunction, and pain.   ACTIVITY LIMITATIONS: carrying, lifting, bending, standing, squatting, stairs, and transfers  PARTICIPATION LIMITATIONS: community activity, yard work, and recreational activity  PERSONAL FACTORS: Time since onset of injury/illness/exacerbation and 3+ comorbidities: Lumbar fusion, R TKA, SBO  are also affecting patient's functional outcome.    GOALS: Goals reviewed with patient? No  SHORT TERM GOALS: Target date: 04/13/2023   Pt will be compliant and knowledgeable with initial HEP for improved comfort and carryover Baseline: initial HEP given  Goal status: INITIAL  2.  Pt will self report back pain no greater than 4/10 for improved comfort and functional ability Baseline:  6/10 at worst Goal status: INITIAL   LONG TERM GOALS: Target date: 05/18/2023   Pt will improve FOTO function score to no less than 75% as proxy for functional improvement Baseline: 67% function Goal status: INITIAL  2.  Pt will self report back pain no greater than 1-2/10 for improved comfort and functional ability Baseline: 6/10 at worst Goal status: INITIAL   3.  Pt will increase 30 Second Sit to Stand rep count to no less than 12 reps for improved balance, strength, and functional mobility Baseline: 10 reps  Goal status: INITIAL   4.  Pt will improve R knee flexion to no less than 100 degrees for improved mobility and decreased stress to lower back Baseline: see ROM chart Goal status: INITIAL   PLAN:  PT FREQUENCY: 1-2x/week  PT DURATION: 8 weeks  PLANNED INTERVENTIONS: Therapeutic exercises, Therapeutic activity, Neuromuscular re-education, Balance training, Gait training, Patient/Family education, Self Care, Joint mobilization, Aquatic Therapy, Dry Needling, Cryotherapy, Moist heat,  Vasopneumatic device, Manual therapy, and Re-evaluation  PLAN FOR NEXT SESSION: assess HEP response, core strengthening, TPDN and massage    Eloy End, PT 04/27/2023, 9:34 AM

## 2023-04-27 DIAGNOSIS — M85851 Other specified disorders of bone density and structure, right thigh: Secondary | ICD-10-CM | POA: Insufficient documentation

## 2023-04-27 NOTE — Patient Instructions (Incomplete)
  HEALTH MAINTENANCE RECOMMENDATIONS:  It is recommended that you get at least 30 minutes of aerobic exercise at least 5 days/week (for weight loss, you may need as much as 60-90 minutes). This can be any activity that gets your heart rate up. This can be divided in 10-15 minute intervals if needed, but try and build up your endurance at least once a week.  Weight bearing exercise is also recommended twice weekly.  Eat a healthy diet with lots of vegetables, fruits and fiber.  "Colorful" foods have a lot of vitamins (ie green vegetables, tomatoes, red peppers, etc).  Limit sweet tea, regular sodas and alcoholic beverages, all of which has a lot of calories and sugar.  Up to 1 alcoholic drink daily may be beneficial for women (unless trying to lose weight, watch sugars).  Drink a lot of water.  Calcium recommendations are 1200-1500 mg daily (1500 mg for postmenopausal women or women without ovaries), and vitamin D 1000 IU daily.  This should be obtained from diet and/or supplements (vitamins), and calcium should not be taken all at once, but in divided doses.  Monthly self breast exams and yearly mammograms for women over the age of 54 is recommended.  Sunscreen of at least SPF 30 should be used on all sun-exposed parts of the skin when outside between the hours of 10 am and 4 pm (not just when at beach or pool, but even with exercise, golf, tennis, and yard work!)  Use a sunscreen that says "broad spectrum" so it covers both UVA and UVB rays, and make sure to reapply every 1-2 hours.  Remember to change the batteries in your smoke detectors when changing your clock times in the spring and fall. Carbon monoxide detectors are recommended for your home.  Use your seat belt every time you are in a car, and please drive safely and not be distracted with cell phones and texting while driving.   Bethany [name] , Thank you for taking time to come for your Welcome to Medicare Visit. I appreciate your ongoing  commitment to your health goals. Please review the following plan we discussed and let me know if I can assist you in the future.   This is a list of the screening recommended for you and due dates:  Health Maintenance  Topic Date Due   COVID-19 Vaccine (6 - 2023-24 season) 07/03/2022   Flu Shot  06/03/2023   Mammogram  10/29/2023   Medicare Annual Wellness Visit  04/27/2024   DTaP/Tdap/Td vaccine (3 - Td or Tdap) 09/13/2028   Colon Cancer Screening  07/01/2029   Pneumonia Vaccine  Completed   DEXA scan (bone density measurement)  Completed   Hepatitis C Screening  Completed   Zoster (Shingles) Vaccine  Completed   HPV Vaccine  Aged Out   I recommend getting the RSV vaccine from the pharmacy in the Fall. You can get this the same day as your flu shot (or separate them by 2 weeks).  I also recommend getting the updated COVID booster when it becomes available in the Fall.

## 2023-04-27 NOTE — Progress Notes (Unsigned)
No chief complaint on file.  Bethany [name] is a 67 y.o. female who presents for annual wellness visit and follow-up on chronic medical conditions.  She has the following concerns:  Hyperlipidemia follow-up:  Patient is reportedly following a low-fat, low cholesterol diet.  Compliant with medications (20 mg simvastatin) and denies medication side effects  Lab Results  Component Value Date   CHOL 228 (H) 04/23/2022   HDL 103 04/23/2022   LDLCALC 113 (H) 04/23/2022   TRIG 72 04/23/2022   CHOLHDL 2.2 04/23/2022   H/o IBS and SBO.  She is under the care of Dr. Katrinka Blazing for various joint pains--last seen in April for R shoulder pain, given injection. Also had adjustment for her back. She has been getting PT for her shoulder UPDATE ***  Osteopenia: last DEXA 09/2020, T-1.7 at R femoral neck.  Vitamin D level was elevated on last check at 107.37 in 11/2020. She is currently taking   Immunization History  Administered Date(s) Administered   Fluad Quad(high Dose 65+) 08/20/2022   Influenza Split 08/20/2015   Influenza Whole 07/02/2010, 07/25/2011   Influenza,inj,Quad PF,6+ Mos 09/07/2013, 09/10/2014, 08/20/2016, 08/04/2017, 07/28/2018, 08/16/2019, 08/28/2020, 07/21/2021   PFIZER(Purple Top)SARS-COV-2 Vaccination 01/25/2020, 02/15/2020, 08/28/2020, 08/28/2020   Pfizer Covid-19 Vaccine Bivalent Booster 26yrs & up 08/25/2021   Pneumococcal Conjugate-13 12/10/2020   Pneumococcal Polysaccharide-23 04/23/2022   Tdap 04/27/2008, 09/13/2018   Zoster Recombinat (Shingrix) 08/04/2017, 09/13/2018   Zoster, Live 01/07/2016   Last Pap smear: N/A. S/p TVH-BSO in 2005 for adenomyosis. Under the care of GYN Last mammogram: 10/2022 Last colonoscopy: 06/2022, tubular adenoma, 7 yr f/u rec Last DEXA: 09/2020 T-1.7 at R femoral neck (done at [facility], ordered by Dr. Katrinka Blazing) Dentist: Ophtho: Exercise: Gym-- cardio, light weights, and walking on track .  Patient Care Team: Joselyn Arrow, MD as PCP -  General (Family Medicine)  GI: Dr. Lavon Paganini Psych: Dr. Nori Riis?? Sports Med: Dr. Katrinka Blazing Plastic surgery: Dr. Ulice Bold (had consult 01/2023) GYN: Ophtho: Dentist: Audiologist: [name] (seen 11/2022 for eval for tinnitus, hearing loss) Neurosurgeon: Dr. Jake Samples Neuro: Marlowe Kays, PA Ortho? Derm? Others?   Depression Screening: Flowsheet Row Office Visit from 04/23/2022 in [facility]  PHQ-2 Total Score 0       Flowsheet Row Office Visit from 04/23/2022 in [facility]  PHQ-9 Total Score 0       Falls screen:     04/23/2022    1:49 PM 04/03/2021    1:47 PM 02/21/2021    3:24 PM 09/07/2013    8:14 AM  Fall Risk   Falls in the past year? 1 0 0 No  Number falls in past yr: 1 0 0   Injury with Fall? 0 0 0   Risk for fall due to : Impaired balance/gait  No Fall Risks   Follow up Falls evaluation completed  Falls evaluation completed      Functional Status Survey:         End of Life Discussion:  Patient {ACTIONS; HAS/DOES NOT HAVE:19233} a living will and medical power of attorney   PMH, PSH, SH and FH were reviewed and updated   ROS:  Patient denies anorexia, fever, weight changes, headaches, vision changes, decreased hearing, URI symptoms, breast concerns, chest pain, palpitations, dizziness, syncope, dyspnea on exertion, cough, swelling, nausea, vomiting, diarrhea, constipation, abdominal pain, melena, hematochezia, indigestion/heartburn, hematuria, incontinence, dysuria, vaginal bleeding, discharge, odor or itch, genital lesions, joint pains, numbness, tingling, weakness, tremor, suspicious skin lesions, depression, anxiety, abnormal bleeding/bruising, or  enlarged lymph nodes.  UPDATE ALL  Hearing loss, tinnitus bilaterally R shoulder pain ?back pain    PHYSICAL EXAM:  There were no vitals taken for this visit.  Wt Readings from Last 3 Encounters:  04/01/23 118 lb (53.5 kg)  02/25/23 116 lb (52.6 kg)  02/15/23 116 lb (52.6 kg)     General Appearance:    Alert, cooperative, no distress, appears stated age  Head:    Normocephalic, without obvious abnormality, atraumatic  Eyes:    PERRL, conjunctiva/corneas clear, EOM's intact, fundi    benign  Ears:    Normal TM's and external ear canals  Nose:   Nares normal, mucosa normal, no drainage or sinus   tenderness  Throat:   Lips, mucosa, and tongue normal; teeth and gums normal  Neck:   Supple, no lymphadenopathy;  thyroid:  no   enlargement/tenderness/nodules; no carotid   bruit or JVD  Back:    Spine nontender, no curvature, ROM normal, no CVA     tenderness  Lungs:     Clear to auscultation bilaterally without wheezes, rales or     ronchi; respirations unlabored  Chest Wall:    No tenderness or deformity   Heart:    Regular rate and rhythm, S1 and S2 normal, no murmur, rub   or gallop  Breast Exam:    Deferred to GYN  Abdomen:     Soft, non-tender, nondistended, normoactive bowel sounds,    no masses, no hepatosplenomegaly  Genitalia:    Deferred to GYN     Extremities:   No clubbing, cyanosis or edema  Pulses:   2+ and symmetric all extremities  Skin:   Skin color, texture, turgor normal, no rashes or lesions  Lymph nodes:   Cervical, supraclavicular, and axillary nodes normal  Neurologic:   CNII-XII intact, normal strength, sensation and gait; reflexes 2+ and symmetric throughout          Psych:   Normal mood, affect, hygiene and grooming.   ***UPDATE ALL OF CPE     ASSESSMENT/PLAN:  MAKE SURE SHE HAS MEDICARE PAPERWORK ON CHART--MOST, LIVING WILL, ETC Not on visit type, so not sure if they will remember to put with her chart up front.  DOES SHE HAVE GYN????  I SEE NO NOTES, EXAM NOT DONE BY J[facility].  WANT TO MAKE SURE I'M NOT DOING. (PREV NANCY YOUNG AND FONTAINE, A LONG TIME AGO)  Did she get COVID booster in 2023?  Consider repeat DEXA now vs 1-2 years-- ?still seeing sports med?--done at [facility], can we order f/u DEXA there????  ENTER PSYCH AND GI  DX  TSH only if thyroid sx Need to clarify D (toxic level on last check in 2022)  Discussed monthly self breast exams and yearly mammograms; at least 30 minutes of aerobic activity at least 5 days/week and weight-bearing exercise 2x/week; proper sunscreen use reviewed; healthy diet, including goals of calcium and vitamin D intake and alcohol recommendations (less than or equal to 1 drink/day) reviewed; regular seatbelt use; changing batteries in smoke detectors, use of carbon monoxide detectors.  Immunization recommendations discussed--continue yearly flu shots (high dose). RSV from the pharmacy in the Fall. Updated COVID booster in the Fall, when available. Colonoscopy recommendations reviewed, UTD (due 2030).  MOST form Living will and healthcare power of attorney   Medicare Attestation I have personally reviewed: The patient's medical and social history Their use of alcohol, tobacco or illicit drugs Their current medications and supplements The patient's functional ability including  ADLs,fall risks, home safety risks, cognitive, and hearing and visual impairment Diet and physical activities Evidence for depression or mood disorders  The patient's weight, height, BMI have been recorded in the chart.  I have made referrals, counseling, and provided education to the patient based on review of the above and I have provided the patient with a written personalized care plan for preventive services.     Lavonda Jumbo, MD

## 2023-04-28 ENCOUNTER — Encounter: Payer: Self-pay | Admitting: Family Medicine

## 2023-04-28 ENCOUNTER — Ambulatory Visit (INDEPENDENT_AMBULATORY_CARE_PROVIDER_SITE_OTHER): Payer: Medicare Other | Admitting: Family Medicine

## 2023-04-28 VITALS — BP 112/64 | HR 72 | Ht 64.0 in | Wt 115.0 lb

## 2023-04-28 DIAGNOSIS — E7800 Pure hypercholesterolemia, unspecified: Secondary | ICD-10-CM

## 2023-04-28 DIAGNOSIS — Z5181 Encounter for therapeutic drug level monitoring: Secondary | ICD-10-CM

## 2023-04-28 DIAGNOSIS — Z0000 Encounter for general adult medical examination without abnormal findings: Secondary | ICD-10-CM | POA: Diagnosis not present

## 2023-04-28 DIAGNOSIS — M85851 Other specified disorders of bone density and structure, right thigh: Secondary | ICD-10-CM | POA: Diagnosis not present

## 2023-04-28 DIAGNOSIS — H903 Sensorineural hearing loss, bilateral: Secondary | ICD-10-CM

## 2023-04-28 DIAGNOSIS — F1021 Alcohol dependence, in remission: Secondary | ICD-10-CM

## 2023-04-28 DIAGNOSIS — F1111 Opioid abuse, in remission: Secondary | ICD-10-CM

## 2023-04-28 DIAGNOSIS — R0683 Snoring: Secondary | ICD-10-CM

## 2023-04-28 DIAGNOSIS — E785 Hyperlipidemia, unspecified: Secondary | ICD-10-CM

## 2023-04-28 DIAGNOSIS — D692 Other nonthrombocytopenic purpura: Secondary | ICD-10-CM

## 2023-04-28 DIAGNOSIS — R400 Somnolence: Secondary | ICD-10-CM

## 2023-04-28 DIAGNOSIS — G478 Other sleep disorders: Secondary | ICD-10-CM

## 2023-04-28 DIAGNOSIS — F325 Major depressive disorder, single episode, in full remission: Secondary | ICD-10-CM

## 2023-04-28 DIAGNOSIS — R5383 Other fatigue: Secondary | ICD-10-CM | POA: Diagnosis not present

## 2023-04-29 ENCOUNTER — Encounter: Payer: Self-pay | Admitting: *Deleted

## 2023-04-29 ENCOUNTER — Other Ambulatory Visit: Payer: Federal, State, Local not specified - PPO

## 2023-04-29 DIAGNOSIS — E785 Hyperlipidemia, unspecified: Secondary | ICD-10-CM

## 2023-04-29 DIAGNOSIS — D692 Other nonthrombocytopenic purpura: Secondary | ICD-10-CM | POA: Insufficient documentation

## 2023-04-29 DIAGNOSIS — R5383 Other fatigue: Secondary | ICD-10-CM

## 2023-04-29 DIAGNOSIS — Z5181 Encounter for therapeutic drug level monitoring: Secondary | ICD-10-CM

## 2023-04-29 DIAGNOSIS — M85851 Other specified disorders of bone density and structure, right thigh: Secondary | ICD-10-CM

## 2023-04-29 DIAGNOSIS — M85852 Other specified disorders of bone density and structure, left thigh: Secondary | ICD-10-CM | POA: Diagnosis not present

## 2023-04-29 LAB — CBC WITH DIFFERENTIAL/PLATELET
Hematocrit: 41.8 % (ref 34.0–46.6)
Immature Grans (Abs): 0 10*3/uL (ref 0.0–0.1)
Lymphocytes Absolute: 1.5 10*3/uL (ref 0.7–3.1)
MCHC: 32.8 g/dL (ref 31.5–35.7)
Monocytes: 9 %
Neutrophils: 52 %
RDW: 11.9 % (ref 11.7–15.4)

## 2023-04-29 LAB — CMP14+EGFR
ALT: 29 IU/L (ref 0–32)
AST: 38 IU/L (ref 0–40)
Alkaline Phosphatase: 85 IU/L (ref 44–121)
Chloride: 99 mmol/L (ref 96–106)
Creatinine, Ser: 1.05 mg/dL — ABNORMAL HIGH (ref 0.57–1.00)
Glucose: 96 mg/dL (ref 70–99)
Sodium: 140 mmol/L (ref 134–144)

## 2023-04-29 LAB — LIPID PANEL
Chol/HDL Ratio: 2.2 ratio (ref 0.0–4.4)
LDL Chol Calc (NIH): 106 mg/dL — ABNORMAL HIGH (ref 0–99)

## 2023-04-30 ENCOUNTER — Other Ambulatory Visit: Payer: Self-pay | Admitting: Family Medicine

## 2023-04-30 DIAGNOSIS — E785 Hyperlipidemia, unspecified: Secondary | ICD-10-CM

## 2023-04-30 LAB — CMP14+EGFR
Albumin: 4.8 g/dL (ref 3.9–4.9)
BUN/Creatinine Ratio: 16 (ref 12–28)
BUN: 17 mg/dL (ref 8–27)
Bilirubin Total: 0.3 mg/dL (ref 0.0–1.2)
CO2: 27 mmol/L (ref 20–29)
Globulin, Total: 2.1 g/dL (ref 1.5–4.5)
Potassium: 5.6 mmol/L — ABNORMAL HIGH (ref 3.5–5.2)
Total Protein: 6.9 g/dL (ref 6.0–8.5)
eGFR: 58 mL/min/{1.73_m2} — ABNORMAL LOW (ref >59)

## 2023-04-30 LAB — CBC WITH DIFFERENTIAL/PLATELET
Basophils Absolute: 0 10*3/uL (ref 0.0–0.2)
Basos: 1 %
EOS (ABSOLUTE): 0.2 10*3/uL (ref 0.0–0.4)
Eos: 3 %
Hemoglobin: 13.7 g/dL (ref 11.1–15.9)
Immature Granulocytes: 0 %
Lymphs: 35 %
MCH: 32.5 pg (ref 26.6–33.0)
MCV: 99 fL — ABNORMAL HIGH (ref 79–97)
Monocytes Absolute: 0.4 10*3/uL (ref 0.1–0.9)
Neutrophils Absolute: 2.3 10*3/uL (ref 1.4–7.0)
Platelets: 240 10*3/uL (ref 150–450)
RBC: 4.22 x10E6/uL (ref 3.77–5.28)
WBC: 4.5 10*3/uL (ref 3.4–10.8)

## 2023-04-30 LAB — TSH: TSH: 3.66 u[IU]/mL (ref 0.450–4.500)

## 2023-04-30 LAB — LIPID PANEL
Cholesterol, Total: 220 mg/dL — ABNORMAL HIGH (ref 100–199)
HDL: 102 mg/dL (ref >39)
Triglycerides: 68 mg/dL (ref 0–149)
VLDL Cholesterol Cal: 12 mg/dL (ref 5–40)

## 2023-04-30 LAB — VITAMIN D 25 HYDROXY (VIT D DEFICIENCY, FRACTURES): Vit D, 25-Hydroxy: 65.8 ng/mL (ref 30.0–100.0)

## 2023-04-30 MED ORDER — SIMVASTATIN 20 MG PO TABS: ORAL_TABLET | ORAL | 3 refills | Status: DC

## 2023-05-03 ENCOUNTER — Ambulatory Visit: Payer: Medicare Other | Attending: Family Medicine

## 2023-05-03 DIAGNOSIS — M25511 Pain in right shoulder: Secondary | ICD-10-CM | POA: Insufficient documentation

## 2023-05-03 DIAGNOSIS — M6281 Muscle weakness (generalized): Secondary | ICD-10-CM | POA: Diagnosis not present

## 2023-05-03 NOTE — Progress Notes (Unsigned)
[name] [address] [place] [address] Phone: [phone] Subjective:   INadine Counts, am serving as a scribe for Dr. Antoine Primas.  I'm seeing this patient by the request  of:  Joselyn Arrow, MD  CC: Back and neck pain follow-up  [id number]  Bethany [name] is a 67 y.o. female coming in with complaint of back and neck pain. OMT on 04/01/2023. Also seen for shoulder pain. Patient states doing well. PCP thinks she needs a bone density scan. No new concerns.  Medications patient has been prescribed:   Taking:         Reviewed prior external information including notes and imaging from previsou exam, outside providers and external EMR if available.   As well as notes that were available from care everywhere and other healthcare systems.  Past medical history, social, surgical and family history all reviewed in electronic medical record.  No pertanent information unless stated regarding to the chief complaint.   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 All Paps normal afterwards   Degenerative disc disease, lumbar 05/02/2020   Dyslipidemia    Generalized anxiety disorder    Herpes genitalis    Herpes labialis    History of calcium pyrophosphate deposition disease (CPPD) 03/26/2020   History of opiate abuse, in remission    History of small bowel obstruction 04/17/2018   partial   Hyperlipidemia with target LDL less than 100 06/17/2011   IBS (irritable bowel syndrome)    Lumbago with sciatica, right side 02/10/2021   Major depressive disorder    Meibomian gland dysfunction (MGD) of both eyes 08/02/2020   Narcotic bowel syndrome (HCC)     Nonallopathic lesion of lumbosacral region 08/05/2017   Nonallopathic lesion of sacral region 08/05/2017   Nonallopathic lesion of thoracic region 08/05/2017   Osteopenia 04/2014   T score -1.7 FRAX 7.3%/0.8% stable from prior DEXA 2013   Raynaud's disease 06/17/2011   Recovering alcoholic in remission     S/P LASIK (laser assisted in situ keratomileusis) 08/02/2020   right eye   Spondylolisthesis of lumbar region 02/10/2021   Status post total right knee replacement 10/31/2019   Tubular adenoma of colon 04/21/2016    Allergies  Allergen Reactions   Morphine And Codeine Nausea Only     Review of Systems:  No headache, visual changes, nausea, vomiting, diarrhea, constipation, dizziness, abdominal pain, skin rash, fevers, chills, night sweats, weight loss, swollen lymph nodes, body aches, joint swelling, chest pain, shortness of breath, mood changes. POSITIVE muscle aches  Objective  Blood pressure 116/68, pulse 80, height 5\' 4"  (1.626 m), weight 116 lb (52.6 kg), SpO2 98 %.   General: No apparent distress alert and oriented x3 mood and affect normal, dressed appropriately.  HEENT: Pupils equal, extraocular movements intact  Respiratory: Patient's speak in full sentences and does not appear short of breath  Cardiovascular: No lower extremity edema, non tender, no erythema    Osteopathic findings  C.3 flexed rotated and side bent right C7 flexed rotated and side bent left T3 extended rotated and side bent right inhaled rib T9 extended rotated and side bent left L2 flexed rotated and side bent right L5 flexed rotated and side  bent left Sacrum right on right       Assessment and Plan:  Osteopenia of necks of both femurs Repeat bone density, has been 3 years.  Would like to use the same machine as we do previously.  Lumbago with sciatica, right side Known significant arthritic changes.  Worsening pain can consider epidurals.  Would not want to make any significant changes  in medications with patient having some difficulty previously as well.  Discussed icing regimen and home exercises, which activities to do and which ones to avoid.  Follow-up again in 6 to 8 weeks otherwise.  Follow-up to see if she is responding to the manipulation at this time.  Continue to stay active otherwise.    Nonallopathic problems  Decision today to treat with OMT was based on Physical Exam  After verbal consent patient was treated with HVLA, ME, FPR techniques in cervical, rib, thoracic, lumbar, and sacral  areas  Patient tolerated the procedure well with improvement in symptoms  Patient given exercises, stretches and lifestyle modifications  See medications in patient instructions if given  Patient will follow up in 4-8 weeks    The above documentation has been reviewed and is accurate and complete Judi Saa, DO          Note: This dictation was prepared with Dragon dictation along with smaller phrase technology. Any transcriptional errors that result from this process are unintentional.

## 2023-05-03 NOTE — Therapy (Addendum)
OUTPATIENT PHYSICAL THERAPY TREATMENT NOTE/DISCHARGE  PHYSICAL THERAPY DISCHARGE SUMMARY  Visits from Start of Care: 7  Current functional level related to goals / functional outcomes: See goals/objective   Remaining deficits: Unable to assess   Education / Equipment: HEP   Patient agrees to discharge. Patient goals were unable to assess. Patient is being discharged due to not returning since the last visit.     Patient Name: Bethany [name] MRN: [id number] DOB:Oct 20, 1956, 67 y.o., female Today's Date: 05/03/2023  END OF SESSION:  PT End of Session - 05/03/23 1614     Visit Number 7    Number of Visits 17    Date for PT Re-Evaluation 05/19/23    Authorization Type BCBS MCR    PT Start Time 1615    PT Stop Time 1640    PT Time Calculation (min) 25 min    Activity Tolerance Patient tolerated treatment well    Behavior During Therapy [facility] for tasks assessed/performed                   Past Medical History:  Diagnosis Date   ADHD (attention deficit hyperactivity disorder) 09/13/2018   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 All Paps normal afterwards   Degenerative disc disease, lumbar 05/02/2020   Dyslipidemia    Generalized anxiety disorder    Herpes genitalis    Herpes labialis    History of calcium pyrophosphate deposition disease (CPPD) 03/26/2020   History of opiate abuse, in remission    History of small bowel obstruction 04/17/2018   partial   Hyperlipidemia with target LDL less than 100 06/17/2011   IBS (irritable bowel syndrome)    Lumbago with sciatica, right side 02/10/2021   Major depressive disorder    Meibomian gland dysfunction (MGD) of both eyes 08/02/2020   Narcotic bowel syndrome (HCC)    Nonallopathic lesion of lumbosacral region 08/05/2017   Nonallopathic  lesion of sacral region 08/05/2017   Nonallopathic lesion of thoracic region 08/05/2017   Osteopenia 04/2014   T score -1.7 FRAX 7.3%/0.8% stable from prior DEXA 2013   Raynaud's disease 06/17/2011   Recovering alcoholic in remission     S/P LASIK (laser assisted in situ keratomileusis) 08/02/2020   right eye   Spondylolisthesis of lumbar region 02/10/2021   Status post total right knee replacement 10/31/2019   Tubular adenoma of colon 04/21/2016   Past Surgical History:  Procedure Laterality Date   AUGMENTATION MAMMAPLASTY     BREAST ENHANCEMENT SURGERY  2006   CATARACT EXTRACTION Bilateral    Jan/Feb 2024   COSMETIC SURGERY     EYE SURGERY     lasik od   GYNECOLOGIC CRYOSURGERY  1990   LAPAROTOMY N/A 04/22/2018   Procedure: EXPLORATORY LAPAROTOMY;  Surgeon: Harriette Bouillon, MD;  Location: [facility] OR;  Service: General;  Laterality: N/A;   LIGAMENT REPAIR Right 09/24/2015   Procedure: RIGHT INDEX METACARPAL PHALANGEAL RADIAL COLLATERAL LIGAMENT REPAIR;  Surgeon: Betha Loa, MD;  Location: [facility];  Service: Orthopedics;  Laterality: Right;   MASS EXCISION N/A 08/04/2018   Procedure: REMOVAL OF SUTURE FROM ABDOMEN/ERAS PATHWAY;  Surgeon: Harriette Bouillon, MD;  Location: [facility];  Service: General;  Laterality: N/A;   OOPHORECTOMY     BSO   Right knee arthoscopy Right 09/09/2017  [facility]   SHX 1 Left 02/19/2020   basel cell carcinoma   SHX1 Right 12/11/2020   basal cell carinoma,micronodular pattern   SKIN BIOPSY Right 02/28/2019   Superficial basal carcinoma    SKIN BIOPSY Left 11/24/2021   superficial basal carcinoma of the left forearm   TONSILLECTOMY AND ADENOIDECTOMY     TOTAL KNEE ARTHROPLASTY Right 04/12/2018   TOTAL KNEE ARTHROPLASTY Right 04/12/2018   Procedure: RIGHT TOTAL KNEE ARTHROPLASTY;  Surgeon: Marcene Corning, MD;  Location: [facility] OR;  Service: Orthopedics;  Laterality: Right;   TRANSFORAMINAL LUMBAR INTERBODY FUSION W/  MIS 1 LEVEL N/A 09/03/2021   Procedure: Lumbar four-five Minimally invasive transforaminal lumbar interbody fusion;  Surgeon: Dawley, Alan Mulder, DO;  Location: [facility] OR;  Service: Neurosurgery;  Laterality: N/A;   TUBAL LIGATION     UPPER GASTROINTESTINAL ENDOSCOPY  04/24/2005   VAGINAL HYSTERECTOMY  2005   TVH BSO  adenomyosis   Patient Active Problem List   Diagnosis Date Noted   Senile purpura (HCC) 04/29/2023   Osteopenia of necks of both femurs 04/27/2023   Left rotator cuff tear 04/01/2023   Seroma due to trauma (HCC) 02/26/2023   Acute bursitis of right shoulder 02/26/2023   Basal cell carcinoma 01/25/2023   Encounter for counseling 01/05/2023   Small bowel obstruction (HCC) 06/16/2022   History of hysterectomy 04/23/2022   Squamous cell carcinoma in situ (SCCIS) of skin of left thigh 08/27/2021   Narcotic abuse in remission (HCC)    Spondylolisthesis of lumbar region 02/10/2021   Lumbago with sciatica, right side 02/10/2021   Alcoholism in remission (HCC)    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 08/02/2020   Meibomian gland dysfunction (MGD) of both eyes 08/02/2020   S/P LASIK (laser assisted in situ keratomileusis) 08/02/2020   Degenerative disc disease, lumbar 05/02/2020   History of calcium pyrophosphate deposition disease (CPPD) 03/26/2020   Arthritis of carpometacarpal (CMC) joint of left thumb 01/18/2020   Status post total right knee replacement 10/31/2019   ADHD (attention deficit hyperactivity disorder) 09/13/2018   IBS (irritable bowel syndrome) 04/17/2018   History of small bowel obstruction 04/17/2018   Nonallopathic lesion of thoracic region 08/05/2017   Nonallopathic lesion of lumbosacral region 08/05/2017   Nonallopathic lesion of sacral region 08/05/2017   Tubular adenoma of colon 04/21/2016   Hyperlipidemia with target LDL less than 100 06/17/2011   Raynaud's disease 06/17/2011    PCP:  Joselyn Arrow, MD  REFERRING PROVIDER: Judi Saa, DO   REFERRING DIAG: [phone] (ICD-10-CM) - Right shoulder pain, unspecified chronicity   THERAPY DIAG:  Right shoulder pain, unspecified chronicity  Muscle weakness (generalized)  Rationale for Evaluation and Treatment: Rehabilitation  ONSET DATE: Chronic  SUBJECTIVE:  SUBJECTIVE STATEMENT: Pt presents to PT with reports of no current back pain. Has been compliant with HEP.  Notes she has to leave early to pick up her grandchildren today.   Hand dominance: Right  PERTINENT HISTORY: Lumbar fusion, R TKA, SBO  PAIN:  Are you having pain?  Yes: NPRS scale: 0/10 Worst: 6/10 Pain location: lower back Pain description: R lower back Aggravating factors: pickle ball, jogging Relieving factors: rest, medication, heat  PRECAUTIONS: None  WEIGHT BEARING RESTRICTIONS: No  FALLS:  Has patient fallen in last 6 months? No  LIVING ENVIRONMENT: Lives with: lives with their family Lives in: House/apartment Stairs: No Has following equipment at home: None  OCCUPATION: Retired  PLOF: Independent  PATIENT GOALS: pt wants to improve her lower back discomfort in order to play pickle ball and exercise with decreased pain  OBJECTIVE:   DIAGNOSTIC FINDINGS:  See imaging  PATIENT SURVEYS:  FOTO: 67% function; 67% predicted  COGNITION: Overall cognitive status: Within functional limits for tasks assessed     SENSATION: WFL  POSTURE: Rounded shoulders, fwd head, reduced lumbar lordosis   UPPER EXTREMITY ROM:   Active ROM Right eval Left eval  Shoulder flexion WNL WNL  Shoulder extension    Shoulder abduction WNL WNL  Shoulder adduction    Shoulder internal rotation    Shoulder external rotation    Elbow flexion    Elbow extension    Wrist flexion    Wrist extension    Wrist ulnar  deviation    Wrist radial deviation    Wrist pronation    Wrist supination    (Blank rows = not tested)  UPPER EXTREMITY MMT:  MMT Right eval Left eval  Shoulder flexion WNL WNL  Shoulder extension    Shoulder abduction WNL WNL  Shoulder adduction    Shoulder internal rotation WNL WNL  Shoulder external rotation WNL WNL  Middle trapezius    Lower trapezius    Elbow flexion    Elbow extension    Wrist flexion    Wrist extension    Wrist ulnar deviation    Wrist radial deviation    Wrist pronation    Wrist supination    Grip strength (lbs)    (Blank rows = not tested)  LOWER EXTREMITY ROM:   Active ROM Right eval Left eval  Shoulder flexion 90 WNL  Shoulder extension 2 WFL  (Blank rows = not tested)  SHOULDER SPECIAL TESTS: Impingement tests: DNT SLAP lesions: DNT Instability tests: DNT Rotator cuff assessment: DNT Biceps assessment: DNT  FUNCTIONAL TESTS:  30 Second Sit to Stand: 10 reps  PALPATION:  Significant TTP to lumbar paraspinals, R>L; no overt TTP to R shoulder    TREATMENT: [facility] Adult PT Treatment:                                                DATE: 05/03/2023 Manual Therapy: Skilled palpation of trigger points for TPDN STM to lumbar paraspinals in prone  Trigger Point Dry Needling Treatment: Pre-treatment instruction: Patient instructed on dry needling rationale, procedures, and possible side effects including pain during treatment (achy,cramping feeling), bruising, drop of blood, lightheadedness, nausea, sweating. Patient Consent Given: Yes Education handout provided: No Muscles treated: bilateral lumbar paraspinals  Needle size and number: .30x75mm x 4 Electrical stimulation performed: No Parameters: N/A Treatment response/outcome: Twitch response elicited and Palpable  decrease in muscle tension Post-treatment instructions: Patient instructed to expect possible mild to moderate muscle soreness later today and/or tomorrow. Patient  instructed in methods to reduce muscle soreness and to continue prescribed HEP. If patient was dry needled over the lung field, patient was instructed on signs and symptoms of pneumothorax and, however unlikely, to see immediate medical attention should they occur. Patient was also educated on signs and symptoms of infection and to seek medical attention should they occur. Patient verbalized understanding of these instructions and education.  [facility] Adult PT Treatment:                                                DATE: 04/26/2023 Therapeutic Exercise: NuStep lvl 5 UE/LE x 3 min while taking subjective Supine pilates SLR 2x15 each Supine PPT x 10 - 5" hold 90/90 hold x 30" 90/90 alt heel tap 2x10 Deadbug 2x10 Manual Therapy: Skilled palpation of trigger points for TPDN STM to lumbar paraspinals in prone  Trigger Point Dry Needling Treatment: Pre-treatment instruction: Patient instructed on dry needling rationale, procedures, and possible side effects including pain during treatment (achy,cramping feeling), bruising, drop of blood, lightheadedness, nausea, sweating. Patient Consent Given: Yes Education handout provided: No Muscles treated: R lumbar paraspinals  Needle size and number: .30x15mm x 2 Electrical stimulation performed: No Parameters: N/A Treatment response/outcome: Twitch response elicited and Palpable decrease in muscle tension Post-treatment instructions: Patient instructed to expect possible mild to moderate muscle soreness later today and/or tomorrow. Patient instructed in methods to reduce muscle soreness and to continue prescribed HEP. If patient was dry needled over the lung field, patient was instructed on signs and symptoms of pneumothorax and, however unlikely, to see immediate medical attention should they occur. Patient was also educated on signs and symptoms of infection and to seek medical attention should they occur. Patient verbalized understanding of these instructions  and education.  [facility] Adult PT Treatment:                                                DATE: 04/21/2023 Therapeutic Exercise: NuStep lvl 5 UE/LE x 3 min while taking subjective DKTC x 60" Supine pilates SLR 2x15 each Supine PPT x 10 - 5" hold Supine PPT with pilates squeeze 2x10 Manual Therapy: Skilled palpation of trigger points for TPDN STM to lumbar paraspinals in prone  Trigger Point Dry Needling Treatment: Pre-treatment instruction: Patient instructed on dry needling rationale, procedures, and possible side effects including pain during treatment (achy,cramping feeling), bruising, drop of blood, lightheadedness, nausea, sweating. Patient Consent Given: Yes Education handout provided: No Muscles treated: R lumbar paraspinals  Needle size and number: .30x40mm x 2 Electrical stimulation performed: No Parameters: N/A Treatment response/outcome: Twitch response elicited and Palpable decrease in muscle tension Post-treatment instructions: Patient instructed to expect possible mild to moderate muscle soreness later today and/or tomorrow. Patient instructed in methods to reduce muscle soreness and to continue prescribed HEP. If patient was dry needled over the lung field, patient was instructed on signs and symptoms of pneumothorax and, however unlikely, to see immediate medical attention should they occur. Patient was also educated on signs and symptoms of infection and to seek medical attention should they occur. Patient verbalized understanding of  these instructions and education.  PATIENT EDUCATION: Education details: continue HEP Person educated: Patient Education method: Explanation, Demonstration, and Handouts Education comprehension: verbalized understanding and returned demonstration  HOME EXERCISE PROGRAM: Access Code: [email or web address] URL: [email or web address] Date: 04/27/2023 Prepared by: Edwinna Areola  Exercises - Supine Double Knee to Chest  - 7 x weekly - 3 sets  - 60 sec hold - Supine Posterior Pelvic Tilt  - 1 x daily - 7 x weekly - 2 sets - 10 reps - 5 sec hold - Supine Lower Trunk Rotation  - 1 x daily - 7 x weekly - 2 sets - 10 reps - 5 sec hold - Supine 90/90 Alternating Heel Touches with Posterior Pelvic Tilt  - 1 x daily - 7 x weekly - 2 sets - 10 reps - Supine Dead Bug with Leg Extension  - 1 x daily - 7 x weekly - 2 sets - 10 reps  ASSESSMENT:  CLINICAL IMPRESSION: Pt responded well to TPDN and manual therapy, noting decrease in pain post session. Pt continues to benefit from skilled PT, will continue per POC.   OBJECTIVE IMPAIRMENTS: Abnormal gait, decreased balance, decreased cognition, decreased endurance, decreased mobility, difficulty walking, decreased ROM, decreased strength, postural dysfunction, and pain.   ACTIVITY LIMITATIONS: carrying, lifting, bending, standing, squatting, stairs, and transfers  PARTICIPATION LIMITATIONS: community activity, yard work, and recreational activity  PERSONAL FACTORS: Time since onset of injury/illness/exacerbation and 3+ comorbidities: Lumbar fusion, R TKA, SBO  are also affecting patient's functional outcome.    GOALS: Goals reviewed with patient? No  SHORT TERM GOALS: Target date: 04/13/2023   Pt will be compliant and knowledgeable with initial HEP for improved comfort and carryover Baseline: initial HEP given  Goal status: INITIAL  2.  Pt will self report back pain no greater than 4/10 for improved comfort and functional ability Baseline: 6/10 at worst Goal status: INITIAL   LONG TERM GOALS: Target date: 05/18/2023   Pt will improve FOTO function score to no less than 75% as proxy for functional improvement Baseline: 67% function Goal status: INITIAL  2.  Pt will self report back pain no greater than 1-2/10 for improved comfort and functional ability Baseline: 6/10 at worst Goal status: INITIAL   3.  Pt will increase 30 Second Sit to Stand rep count to no less than 12 reps for  improved balance, strength, and functional mobility Baseline: 10 reps  Goal status: INITIAL   4.  Pt will improve R knee flexion to no less than 100 degrees for improved mobility and decreased stress to lower back Baseline: see ROM chart Goal status: INITIAL   PLAN:  PT FREQUENCY: 1-2x/week  PT DURATION: 8 weeks  PLANNED INTERVENTIONS: Therapeutic exercises, Therapeutic activity, Neuromuscular re-education, Balance training, Gait training, Patient/Family education, Self Care, Joint mobilization, Aquatic Therapy, Dry Needling, Cryotherapy, Moist heat, Vasopneumatic device, Manual therapy, and Re-evaluation  PLAN FOR NEXT SESSION: assess HEP response, core strengthening, TPDN and massage    Eloy End, PT 05/03/2023, 5:08 PM

## 2023-05-05 ENCOUNTER — Ambulatory Visit (INDEPENDENT_AMBULATORY_CARE_PROVIDER_SITE_OTHER): Payer: Medicare Other | Admitting: Family Medicine

## 2023-05-05 ENCOUNTER — Encounter: Payer: Federal, State, Local not specified - PPO | Admitting: Family Medicine

## 2023-05-05 VITALS — BP 116/68 | HR 80 | Ht 64.0 in | Wt 116.0 lb

## 2023-05-05 DIAGNOSIS — M85852 Other specified disorders of bone density and structure, left thigh: Secondary | ICD-10-CM

## 2023-05-05 DIAGNOSIS — M9904 Segmental and somatic dysfunction of sacral region: Secondary | ICD-10-CM | POA: Diagnosis not present

## 2023-05-05 DIAGNOSIS — G8929 Other chronic pain: Secondary | ICD-10-CM

## 2023-05-05 DIAGNOSIS — M85851 Other specified disorders of bone density and structure, right thigh: Secondary | ICD-10-CM | POA: Diagnosis not present

## 2023-05-05 DIAGNOSIS — M9902 Segmental and somatic dysfunction of thoracic region: Secondary | ICD-10-CM

## 2023-05-05 DIAGNOSIS — M9903 Segmental and somatic dysfunction of lumbar region: Secondary | ICD-10-CM | POA: Diagnosis not present

## 2023-05-05 DIAGNOSIS — M9908 Segmental and somatic dysfunction of rib cage: Secondary | ICD-10-CM | POA: Diagnosis not present

## 2023-05-05 DIAGNOSIS — M5441 Lumbago with sciatica, right side: Secondary | ICD-10-CM | POA: Diagnosis not present

## 2023-05-05 DIAGNOSIS — M9901 Segmental and somatic dysfunction of cervical region: Secondary | ICD-10-CM

## 2023-05-05 NOTE — Assessment & Plan Note (Signed)
Repeat bone density, has been 3 years.  Would like to use the same machine as we do previously.

## 2023-05-05 NOTE — Assessment & Plan Note (Signed)
Known significant arthritic changes.  Worsening pain can consider epidurals.  Would not want to make any significant changes in medications with patient having some difficulty previously as well.  Discussed icing regimen and home exercises, which activities to do and which ones to avoid.  Follow-up again in 6 to 8 weeks otherwise.  Follow-up to see if she is responding to the manipulation at this time.  Continue to stay active otherwise.

## 2023-05-05 NOTE — Patient Instructions (Addendum)
Schedule Bone density Good to see you! Have fun at your block party See you again in 5-6 weeks

## 2023-05-10 ENCOUNTER — Encounter (HOSPITAL_BASED_OUTPATIENT_CLINIC_OR_DEPARTMENT_OTHER): Payer: Self-pay

## 2023-05-10 ENCOUNTER — Emergency Department (HOSPITAL_BASED_OUTPATIENT_CLINIC_OR_DEPARTMENT_OTHER): Payer: Medicare Other

## 2023-05-10 ENCOUNTER — Other Ambulatory Visit: Payer: Self-pay

## 2023-05-10 ENCOUNTER — Inpatient Hospital Stay (HOSPITAL_COMMUNITY): Payer: Medicare Other

## 2023-05-10 ENCOUNTER — Ambulatory Visit: Payer: Medicare Other

## 2023-05-10 ENCOUNTER — Inpatient Hospital Stay (HOSPITAL_BASED_OUTPATIENT_CLINIC_OR_DEPARTMENT_OTHER)
Admission: EM | Admit: 2023-05-10 | Discharge: 2023-05-24 | DRG: 335 | Disposition: A | Discharge status: Home / Self Care | Payer: Medicare Other | Attending: Internal Medicine | Admitting: Internal Medicine

## 2023-05-10 DIAGNOSIS — Z85828 Personal history of other malignant neoplasm of skin: Secondary | ICD-10-CM

## 2023-05-10 DIAGNOSIS — R1084 Generalized abdominal pain: Secondary | ICD-10-CM | POA: Diagnosis not present

## 2023-05-10 DIAGNOSIS — F411 Generalized anxiety disorder: Secondary | ICD-10-CM | POA: Diagnosis not present

## 2023-05-10 DIAGNOSIS — G928 Other toxic encephalopathy: Secondary | ICD-10-CM | POA: Diagnosis not present

## 2023-05-10 DIAGNOSIS — K5651 Intestinal adhesions [bands], with partial obstruction: Principal | ICD-10-CM | POA: Diagnosis present

## 2023-05-10 DIAGNOSIS — K56609 Unspecified intestinal obstruction, unspecified as to partial versus complete obstruction: Secondary | ICD-10-CM | POA: Diagnosis not present

## 2023-05-10 DIAGNOSIS — Z79899 Other long term (current) drug therapy: Secondary | ICD-10-CM | POA: Diagnosis not present

## 2023-05-10 DIAGNOSIS — Z803 Family history of malignant neoplasm of breast: Secondary | ICD-10-CM

## 2023-05-10 DIAGNOSIS — Z87410 Personal history of cervical dysplasia: Secondary | ICD-10-CM

## 2023-05-10 DIAGNOSIS — F1021 Alcohol dependence, in remission: Secondary | ICD-10-CM | POA: Diagnosis not present

## 2023-05-10 DIAGNOSIS — T17908A Unspecified foreign body in respiratory tract, part unspecified causing other injury, initial encounter: Secondary | ICD-10-CM | POA: Diagnosis not present

## 2023-05-10 DIAGNOSIS — E876 Hypokalemia: Secondary | ICD-10-CM | POA: Diagnosis not present

## 2023-05-10 DIAGNOSIS — R112 Nausea with vomiting, unspecified: Secondary | ICD-10-CM | POA: Diagnosis not present

## 2023-05-10 DIAGNOSIS — J9601 Acute respiratory failure with hypoxia: Secondary | ICD-10-CM | POA: Diagnosis not present

## 2023-05-10 DIAGNOSIS — Z888 Allergy status to other drugs, medicaments and biological substances status: Secondary | ICD-10-CM

## 2023-05-10 DIAGNOSIS — H18593 Other hereditary corneal dystrophies, bilateral: Secondary | ICD-10-CM | POA: Diagnosis not present

## 2023-05-10 DIAGNOSIS — K567 Ileus, unspecified: Secondary | ICD-10-CM | POA: Diagnosis not present

## 2023-05-10 DIAGNOSIS — Z66 Do not resuscitate: Secondary | ICD-10-CM | POA: Diagnosis not present

## 2023-05-10 DIAGNOSIS — K30 Functional dyspepsia: Secondary | ICD-10-CM | POA: Diagnosis not present

## 2023-05-10 DIAGNOSIS — E785 Hyperlipidemia, unspecified: Secondary | ICD-10-CM | POA: Diagnosis not present

## 2023-05-10 DIAGNOSIS — R092 Respiratory arrest: Secondary | ICD-10-CM | POA: Diagnosis not present

## 2023-05-10 DIAGNOSIS — K565 Intestinal adhesions [bands], unspecified as to partial versus complete obstruction: Secondary | ICD-10-CM | POA: Diagnosis not present

## 2023-05-10 DIAGNOSIS — Z90710 Acquired absence of both cervix and uterus: Secondary | ICD-10-CM

## 2023-05-10 DIAGNOSIS — I7300 Raynaud's syndrome without gangrene: Secondary | ICD-10-CM | POA: Diagnosis present

## 2023-05-10 DIAGNOSIS — F419 Anxiety disorder, unspecified: Secondary | ICD-10-CM | POA: Diagnosis not present

## 2023-05-10 DIAGNOSIS — Z87891 Personal history of nicotine dependence: Secondary | ICD-10-CM | POA: Diagnosis not present

## 2023-05-10 DIAGNOSIS — T402X1A Poisoning by other opioids, accidental (unintentional), initial encounter: Secondary | ICD-10-CM | POA: Diagnosis not present

## 2023-05-10 DIAGNOSIS — Z8719 Personal history of other diseases of the digestive system: Secondary | ICD-10-CM | POA: Diagnosis not present

## 2023-05-10 DIAGNOSIS — N200 Calculus of kidney: Secondary | ICD-10-CM | POA: Diagnosis not present

## 2023-05-10 DIAGNOSIS — H18523 Epithelial (juvenile) corneal dystrophy, bilateral: Secondary | ICD-10-CM | POA: Diagnosis present

## 2023-05-10 DIAGNOSIS — M8580 Other specified disorders of bone density and structure, unspecified site: Secondary | ICD-10-CM | POA: Diagnosis not present

## 2023-05-10 DIAGNOSIS — Z4682 Encounter for fitting and adjustment of non-vascular catheter: Secondary | ICD-10-CM | POA: Diagnosis not present

## 2023-05-10 DIAGNOSIS — K5669 Other partial intestinal obstruction: Secondary | ICD-10-CM | POA: Diagnosis not present

## 2023-05-10 DIAGNOSIS — Z818 Family history of other mental and behavioral disorders: Secondary | ICD-10-CM

## 2023-05-10 DIAGNOSIS — F32A Depression, unspecified: Secondary | ICD-10-CM | POA: Diagnosis not present

## 2023-05-10 DIAGNOSIS — T17900A Unspecified foreign body in respiratory tract, part unspecified causing asphyxiation, initial encounter: Secondary | ICD-10-CM | POA: Diagnosis not present

## 2023-05-10 DIAGNOSIS — Z807 Family history of other malignant neoplasms of lymphoid, hematopoietic and related tissues: Secondary | ICD-10-CM

## 2023-05-10 DIAGNOSIS — Z96651 Presence of right artificial knee joint: Secondary | ICD-10-CM | POA: Diagnosis present

## 2023-05-10 DIAGNOSIS — Z86010 Personal history of colonic polyps: Secondary | ICD-10-CM

## 2023-05-10 DIAGNOSIS — F39 Unspecified mood [affective] disorder: Secondary | ICD-10-CM | POA: Diagnosis present

## 2023-05-10 DIAGNOSIS — Z8249 Family history of ischemic heart disease and other diseases of the circulatory system: Secondary | ICD-10-CM

## 2023-05-10 DIAGNOSIS — F1111 Opioid abuse, in remission: Secondary | ICD-10-CM | POA: Diagnosis present

## 2023-05-10 DIAGNOSIS — Z885 Allergy status to narcotic agent status: Secondary | ICD-10-CM | POA: Diagnosis not present

## 2023-05-10 LAB — COMPREHENSIVE METABOLIC PANEL
ALT: 24 U/L (ref 0–44)
AST: 31 U/L (ref 15–41)
Albumin: 4.7 g/dL (ref 3.5–5.0)
Alkaline Phosphatase: 59 U/L (ref 38–126)
Anion gap: 9 (ref 5–15)
BUN: 16 mg/dL (ref 8–23)
CO2: 30 mmol/L (ref 22–32)
Calcium: 10 mg/dL (ref 8.9–10.3)
Chloride: 101 mmol/L (ref 98–111)
Creatinine, Ser: 0.98 mg/dL (ref 0.44–1.00)
GFR, Estimated: >60 mL/min (ref >60)
Glucose, Bld: 123 mg/dL — ABNORMAL HIGH (ref 70–99)
Potassium: 3.9 mmol/L (ref 3.5–5.1)
Sodium: 140 mmol/L (ref 135–145)
Total Bilirubin: 0.4 mg/dL (ref 0.3–1.2)
Total Protein: 7 g/dL (ref 6.5–8.1)

## 2023-05-10 LAB — CBC WITH DIFFERENTIAL/PLATELET
Abs Immature Granulocytes: 0.01 10*3/uL (ref 0.00–0.07)
Basophils Absolute: 0.1 10*3/uL (ref 0.0–0.1)
Basophils Relative: 1 %
Eosinophils Absolute: 0.2 10*3/uL (ref 0.0–0.5)
Eosinophils Relative: 4 %
HCT: 41.2 % (ref 36.0–46.0)
Hemoglobin: 13.7 g/dL (ref 12.0–15.0)
Immature Granulocytes: 0 %
Lymphocytes Relative: 41 %
Lymphs Abs: 2.3 10*3/uL (ref 0.7–4.0)
MCH: 33.9 pg (ref 26.0–34.0)
MCHC: 33.3 g/dL (ref 30.0–36.0)
MCV: 102 fL — ABNORMAL HIGH (ref 80.0–100.0)
Monocytes Absolute: 0.4 10*3/uL (ref 0.1–1.0)
Monocytes Relative: 7 %
Neutro Abs: 2.7 10*3/uL (ref 1.7–7.7)
Neutrophils Relative %: 47 %
Platelets: 192 10*3/uL (ref 150–400)
RBC: 4.04 MIL/uL (ref 3.87–5.11)
RDW: 12.5 % (ref 11.5–15.5)
WBC: 5.7 10*3/uL (ref 4.0–10.5)
nRBC: 0 % (ref 0.0–0.2)

## 2023-05-10 LAB — GLUCOSE, CAPILLARY: Glucose-Capillary: 254 mg/dL — ABNORMAL HIGH (ref 70–99)

## 2023-05-10 LAB — LIPASE, BLOOD: Lipase: 27 U/L (ref 11–51)

## 2023-05-10 MED ORDER — LACTATED RINGERS IV SOLN: INTRAVENOUS | Status: DC

## 2023-05-10 MED ORDER — LOTILANER 0.25 % OP SOLN: 1.0000 [drp] | Freq: Two times a day (BID) | OPHTHALMIC | Status: DC

## 2023-05-10 MED ORDER — HYDRALAZINE HCL 20 MG/ML IJ SOLN: 10.0000 mg | INTRAMUSCULAR | Status: DC | PRN

## 2023-05-10 MED ORDER — GABAPENTIN 300 MG PO CAPS
600.0000 mg | ORAL_CAPSULE | Freq: Every day | ORAL | Status: DC
  Administered 2023-05-19 – 2023-05-24 (×6): 600 mg via ORAL
  Filled 2023-05-10 (×8): qty 2

## 2023-05-10 MED ORDER — MORPHINE SULFATE (PF) 4 MG/ML IV SOLN: 4.0000 mg | Freq: Once | INTRAVENOUS | Status: DC

## 2023-05-10 MED ORDER — PREDNISOLONE ACETATE 1 % OP SUSP
1.0000 [drp] | Freq: Four times a day (QID) | OPHTHALMIC | Status: DC
  Administered 2023-05-10 – 2023-05-22 (×42): 1 [drp] via OPHTHALMIC
  Filled 2023-05-10 (×2): qty 5

## 2023-05-10 MED ORDER — LAMOTRIGINE 100 MG PO TABS
100.0000 mg | ORAL_TABLET | Freq: Two times a day (BID) | ORAL | Status: DC
  Administered 2023-05-18: 100 mg via ORAL
  Filled 2023-05-10 (×3): qty 1

## 2023-05-10 MED ORDER — HYDROMORPHONE HCL 1 MG/ML IJ SOLN
1.0000 mg | INTRAMUSCULAR | Status: DC | PRN
  Administered 2023-05-10 – 2023-05-11 (×2): 1 mg via INTRAVENOUS
  Filled 2023-05-10 (×2): qty 1

## 2023-05-10 MED ORDER — ACETAMINOPHEN 325 MG PO TABS
650.0000 mg | ORAL_TABLET | Freq: Four times a day (QID) | ORAL | Status: DC | PRN
  Filled 2023-05-10: qty 2

## 2023-05-10 MED ORDER — IOHEXOL 300 MG/ML  SOLN
100.0000 mL | Freq: Once | INTRAMUSCULAR | Status: AC | PRN
  Administered 2023-05-10: 75 mL via INTRAVENOUS

## 2023-05-10 MED ORDER — SODIUM CHLORIDE 0.9 % IV BOLUS
1000.0000 mL | Freq: Once | INTRAVENOUS | Status: AC
  Administered 2023-05-10: 1000 mL via INTRAVENOUS

## 2023-05-10 MED ORDER — TRAZODONE HCL 50 MG PO TABS
50.0000 mg | ORAL_TABLET | Freq: Every day | ORAL | Status: DC
  Administered 2023-05-18 – 2023-05-22 (×5): 50 mg via ORAL
  Filled 2023-05-10 (×6): qty 1

## 2023-05-10 MED ORDER — SIMVASTATIN 20 MG PO TABS
20.0000 mg | ORAL_TABLET | Freq: Every day | ORAL | Status: DC
  Administered 2023-05-18 – 2023-05-23 (×6): 20 mg via ORAL
  Filled 2023-05-10 (×8): qty 1

## 2023-05-10 MED ORDER — DIATRIZOATE MEGLUMINE & SODIUM 66-10 % PO SOLN
90.0000 mL | Freq: Once | ORAL | Status: AC
  Administered 2023-05-10: 90 mL via NASOGASTRIC
  Filled 2023-05-10: qty 120
  Filled 2023-05-10: qty 90

## 2023-05-10 MED ORDER — GABAPENTIN 400 MG PO CAPS
1200.0000 mg | ORAL_CAPSULE | Freq: Every day | ORAL | Status: DC
  Administered 2023-05-18 – 2023-05-23 (×6): 1200 mg via ORAL
  Filled 2023-05-10 (×7): qty 3

## 2023-05-10 MED ORDER — ONDANSETRON HCL 4 MG/2ML IJ SOLN
4.0000 mg | Freq: Four times a day (QID) | INTRAMUSCULAR | Status: DC | PRN
  Administered 2023-05-10 – 2023-05-12 (×4): 4 mg via INTRAVENOUS
  Filled 2023-05-10 (×4): qty 2

## 2023-05-10 MED ORDER — ACETAMINOPHEN 650 MG RE SUPP
650.0000 mg | Freq: Four times a day (QID) | RECTAL | Status: DC | PRN
  Administered 2023-05-11: 650 mg via RECTAL
  Filled 2023-05-10: qty 1

## 2023-05-10 MED ORDER — ONDANSETRON HCL 4 MG/2ML IJ SOLN
4.0000 mg | Freq: Once | INTRAMUSCULAR | Status: AC
  Administered 2023-05-10: 4 mg via INTRAVENOUS
  Filled 2023-05-10: qty 2

## 2023-05-10 MED ORDER — KETOROLAC TROMETHAMINE 30 MG/ML IJ SOLN
30.0000 mg | Freq: Four times a day (QID) | INTRAMUSCULAR | Status: AC | PRN
  Administered 2023-05-10 – 2023-05-15 (×4): 30 mg via INTRAVENOUS
  Filled 2023-05-10 (×4): qty 1

## 2023-05-10 MED ORDER — ONDANSETRON 4 MG PO TBDP: 4.0000 mg | ORAL_TABLET | Freq: Four times a day (QID) | ORAL | Status: DC | PRN

## 2023-05-10 MED ORDER — HYDROMORPHONE HCL 1 MG/ML IJ SOLN
1.0000 mg | Freq: Once | INTRAMUSCULAR | Status: AC
  Administered 2023-05-10: 1 mg via INTRAVENOUS
  Filled 2023-05-10: qty 1

## 2023-05-10 MED ORDER — NALOXONE HCL 0.4 MG/ML IJ SOLN
INTRAMUSCULAR | Status: AC
  Administered 2023-05-10: 0.4 mg
  Filled 2023-05-10: qty 1

## 2023-05-10 MED ORDER — SODIUM CHLORIDE 0.9 % IV BOLUS
500.0000 mL | Freq: Once | INTRAVENOUS | Status: AC
  Administered 2023-05-10: 500 mL via INTRAVENOUS

## 2023-05-10 MED ORDER — GABAPENTIN 600 MG PO TABS: 600.0000 mg | ORAL_TABLET | ORAL | Status: DC

## 2023-05-10 NOTE — Progress Notes (Signed)
Called for patient with rapid response.  She was found obtunded, agonal breathing, sats down to 40% and unresponsive.  Bag valve respirations were provided and she is thought to have aspirated.  She was given Narcan with rapid improvement, although she remained mildly confused/dazed at the time of my evaluation.    Her husband reports that "she hides it well", he is uncertain if she is taking inappropriate pain medications.  She does have a problem with opiates and he is uncertain about whether she has been buying them on the street.  He confirms that she is DNR.    On exam, she was mildly confused but oriented to person and somewhat to place.  She denied pain.  Repeat labs + CXR were drawn.  She is currently stable on [place] O2.  Will hold antibiotics for now.  Transfer to progressive care for closer monitoring.    Georgana Curio, M.D.

## 2023-05-10 NOTE — Progress Notes (Signed)
Called husband to update him on patients transfer to [facility] no answer voice mail left.

## 2023-05-10 NOTE — ED Provider Notes (Signed)
[place] EMERGENCY DEPARTMENT AT [facility] Provider Note   CSN: [id number] Arrival date & time: 05/10/23  4098     History  Chief Complaint  Patient presents with   Abdominal Pain    Bethany [name] is a 67 y.o. female.  Patient is a 67 year old female with past medical history of recurrent small bowel obstructions.  Patient presenting today with complaints of abdominal pain and nausea.  This woke her from sleep approximately 2 hours ago.  She does report having a small bowel movement just prior to the onset of pain.  Stool was somewhat loose, but not diarrhea.  She denies any bloody stool.  She denies fevers or chills.  This feels similar to small bowel obstructions she has experienced in the past.  Last episode was 1 year ago.  The history is provided by the patient.       Home Medications Prior to Admission medications   Medication Sig Start Date End Date Taking? Authorizing Provider  acetaminophen (TYLENOL) 500 MG tablet Take 1,000 mg by mouth every 6 (six) hours as needed for moderate pain. Patient not taking: Reported on 04/28/2023    [provider]  bisacodyl (DULCOLAX) 5 MG EC tablet Take 1 tablet (5 mg total) by mouth daily as needed for moderate constipation. 04/14/18   Elodia Florence, PA-C  Cyanocobalamin (VITAMIN B-12 PO) Take 5,000 mg by mouth daily.    [provider]  gabapentin (NEURONTIN) 600 MG tablet Take 600-1,200 mg by mouth See admin instructions. Take 600 mg by mouth in the morning and 1200 mg at night    [provider]  hydrocortisone 2.5 % cream Apply 1 Application topically 2 (two) times daily as needed (itching). Patient not taking: Reported on 04/28/2023    [provider]  ibuprofen (ADVIL) 800 MG tablet TAKE 1 TABLET(800 MG) BY MOUTH EVERY 8 HOURS AS NEEDED 03/02/22   Ronnald Nian, MD  lamoTRIgine (LAMICTAL) 100 MG tablet Take 100 mg by mouth 2 (two) times daily.  05/19/15   [provider]   MAGNESIUM CITRATE PO Take 250 mg by mouth daily.    [provider]  MOVANTIK 12.5 MG TABS tablet TAKE 1 TABLET(12.5MG ) BY MOUTH EVERY DAY 02/01/23   Napoleon Form, MD  Multiple Vitamin (MULTIVITAMIN ADULT) TABS Take 1 tablet by mouth every evening.    [provider]  polyethylene glycol (MIRALAX) 17 g packet Take 17 g by mouth daily. 10/03/20   Erick Blinks, MD  simvastatin (ZOCOR) 20 MG tablet TAKE 1 TABLET(20 MG) BY MOUTH DAILY 04/30/23   Joselyn Arrow, MD  traZODone (DESYREL) 50 MG tablet Take 50 mg by mouth at bedtime. 10/23/19   [provider]  VEVYE 0.1 % SOLN Apply 1 drop to eye 2 (two) times daily. 04/07/23   [provider]  vitamin C (ASCORBIC ACID) 500 MG tablet Take 500 mg by mouth every evening.    [provider]  VITAMIN D, CHOLECALCIFEROL, PO Take 5,000 Int'l Units by mouth daily.    [provider]  XDEMVY 0.25 % SOLN Apply 1 drop to eye 2 (two) times daily. 04/08/23   [provider]  zinc gluconate 50 MG tablet Take 50 mg by mouth every evening.    [provider]      Allergies    Buprenorphine hcl and Morphine and codeine    Review of Systems   Review of Systems  All other systems reviewed and are negative.  Physical Exam Updated Vital Signs BP 127/71   Pulse 63   Temp (!) 97.4 F (36.3 C)   Resp 18   Ht 5\' 4"  (1.626 m)   Wt 52.2 kg   SpO2 99%   BMI 19.74 kg/m  Physical Exam Vitals and nursing note reviewed.  Constitutional:      General: She is not in acute distress.    Appearance: She is well-developed. She is not diaphoretic.  HENT:     Head: Normocephalic and atraumatic.  Cardiovascular:     Rate and Rhythm: Normal rate and regular rhythm.     Heart sounds: No murmur heard.    No friction rub. No gallop.  Pulmonary:     Effort: Pulmonary effort is normal. No respiratory distress.     Breath sounds: Normal breath sounds. No wheezing.  Abdominal:     General: Bowel sounds  are normal. There is no distension.     Palpations: Abdomen is soft.     Tenderness: There is abdominal tenderness in the periumbilical area and suprapubic area. There is no right CVA tenderness, left CVA tenderness, guarding or rebound.  Musculoskeletal:        General: Normal range of motion.     Cervical back: Normal range of motion and neck supple.  Skin:    General: Skin is warm and dry.  Neurological:     General: No focal deficit present.     Mental Status: She is alert and oriented to person, place, and time.     ED Results / Procedures / Treatments   Labs (all labs ordered are listed, but only abnormal results are displayed) Labs Reviewed  LIPASE, BLOOD  COMPREHENSIVE METABOLIC PANEL  URINALYSIS, ROUTINE W REFLEX MICROSCOPIC  CBC WITH DIFFERENTIAL/PLATELET    EKG None  Radiology No results found.  Procedures Procedures  {Document cardiac monitor, telemetry assessment procedure when appropriate:1}  Medications Ordered in ED Medications  sodium chloride 0.9 % bolus 1,000 mL (has no administration in time range)  ondansetron (ZOFRAN) injection 4 mg (has no administration in time range)  HYDROmorphone (DILAUDID) injection 1 mg (has no administration in time range)    ED Course/ Medical Decision Making/ A&P   {   Click here for ABCD2, HEART and other calculatorsREFRESH Note before signing :1}                          Medical Decision Making Amount and/or Complexity of Data Reviewed Labs: ordered.  Risk Prescription drug management.   ***  {Document critical care time when appropriate:1} {Document review of labs and clinical decision tools ie heart score, Chads2Vasc2 etc:1}  {Document your independent review of radiology images, and any outside records:1} {Document your discussion with family members, caretakers, and with consultants:1} {Document social determinants of health affecting pt's care:1} {Document your decision making why or why not  admission, treatments were needed:1} Final Clinical Impression(s) / ED Diagnoses Final diagnoses:  None    Rx / DC Orders ED Discharge Orders     None

## 2023-05-10 NOTE — Progress Notes (Signed)
Chaplain responded to Code Blue.  Pt revived.  No family present.  Unclear if family has been notified.    Chaplain on standby all night if needed.  Vernell Morgans Chaplain

## 2023-05-10 NOTE — Progress Notes (Signed)
Report called to Maralyn Sago, RN on 5W.

## 2023-05-10 NOTE — Progress Notes (Signed)
Plan of Care Note for accepted transfer   Patient: Bethany [name] MRN: [id number]   DOA: 05/10/2023  Facility requesting transfer: [facility] Requesting Provider: Wallace Cullens Reason for transfer: SBO  Facility course: Patient with h/o HLD, CPPD, and HLD presenting with abdominal pain.  3 prior admissions for the same.  Here with SBO.  Labs stable.  Surgery will consult upon arrival.  Symptoms stable.   Plan of care: The patient is accepted for admission to Med-surg  unit, at [facility].   Author: Jonah Blue, MD 05/10/2023  Check [email or web address] for on-call coverage.  Nursing staff, Please call [facility] Admits & Consults System-Wide number on Amion as soon as patient's arrival, so appropriate admitting provider can evaluate the pt.

## 2023-05-10 NOTE — Consult Note (Signed)
NAME:  Bethany [name], MRN:  [id number], DOB:  December 21, 1955, LOS: 0 ADMISSION DATE:  05/10/2023, CONSULTATION DATE:  05/10/2023 REFERRING MD:  Jonah Blue, MD CHIEF COMPLAINT:  Respiratory Arrest   History of Present Illness:  Bethany [name] is a 67 year old woman who presented with abdominal pain this morning and is admitted for small bowel obstruction noted on CT abdomen and pelvis. General surgery was consulted and placed on SBO protocol. She was admitted by hospitalist service.   A code blue was called and I responded. Patient developed respiratory arrest with loss of consciousness and SpO2 into the 40s. Patient was receiving bag mask ventilation upon my arrival with SpO2 in the 90s. She had pulse with normal blood pressure. She was listed as DNR/DNI. Narcan IV was administered and patient responded within a minute with improved consciousness and increased respiratory rate. She had strong cough and was suctioned of upper airway secretions.   She received 3mg  of IV dilaudid throughout today.  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 All Paps normal afterwards   Degenerative disc disease, lumbar 05/02/2020   Generalized anxiety disorder    Herpes genitalis    Herpes labialis    History of calcium pyrophosphate deposition disease (CPPD) 03/26/2020   History of opiate abuse, in remission    History of small bowel obstruction 04/17/2018   partial   Hyperlipidemia with target LDL less than 100 06/17/2011   IBS (irritable bowel syndrome)    Lumbago with sciatica, right side 02/10/2021   Major depressive disorder    Meibomian gland dysfunction (MGD) of both eyes 08/02/2020   Narcotic  bowel syndrome (HCC)    Osteopenia 04/2014   T score -1.7 FRAX 7.3%/0.8% stable from prior DEXA 2013   Raynaud's disease 06/17/2011   Recovering alcoholic in remission     S/P LASIK (laser assisted in situ keratomileusis) 08/02/2020   right eye   Spondylolisthesis of lumbar region 02/10/2021   Status post total right knee replacement 10/31/2019   Tubular adenoma of colon 04/21/2016   Significant Hospital Events: Including procedures, antibiotic start and stop dates in addition to other pertinent events     Interim History / Subjective:  As above  Objective   Blood pressure (!) 158/72, pulse 79, temperature 98.2 F (36.8 C), temperature source Oral, resp. rate 18, height 5\' 4"  (1.626 m), weight 52.2 kg, SpO2 100 %.       No intake or output data in the 24 hours ending 05/10/23 1825 Filed Weights   05/10/23 0535  Weight: 52.2 kg    Examination: General: elderly woman, moderate distress HENT: audible upper airway secretions, scler anicteric Lungs: course rhonchi on right Cardiovascular: tachycardic, no murmur Abdomen: soft, non-tender non-distended Extremities: cool to touch, no edema Neuro: awake, alert, confused GU: n/a  Resolved Hospital Problem list     Assessment & Plan:  Acute Respiratory Arrest Acute Hypoxemic Respiratory Failure Aspiration Event Unintentional Narcotic Overdose  Plan: - continue supplemental oxygen for SpO2 92% or higher - monitor closely for additional narcan needs Chest chest x-ray - transfer to progressive care unit per primary team, if she requires multiple doses please call CCM for transfer to ICU for  narcan drip - low threshold to start antibiotics for aspiration pneumonia - give bolus of LR  Please call PCCM with any further questions.   Best Practice (right click and "Reselect all SmartList Selections" daily)   Per primary  Labs   CBC: Recent Labs  Lab 05/10/23 0537  WBC 5.7  NEUTROABS 2.7  HGB 13.7  HCT 41.2   MCV 102.0*  PLT 192    Basic Metabolic Panel: Recent Labs  Lab 05/10/23 0537  NA 140  K 3.9  CL 101  CO2 30  GLUCOSE 123*  BUN 16  CREATININE 0.98  CALCIUM 10.0   GFR: Estimated Creatinine Clearance: 45.9 mL/min (by C-G formula based on SCr of 0.98 mg/dL). Recent Labs  Lab 05/10/23 0537  WBC 5.7    Liver Function Tests: Recent Labs  Lab 05/10/23 0537  AST 31  ALT 24  ALKPHOS 59  BILITOT 0.4  PROT 7.0  ALBUMIN 4.7   Recent Labs  Lab 05/10/23 0537  LIPASE 27   No results for input(s): "AMMONIA" in the last 168 hours.  ABG No results found for: "PHART", "PCO2ART", "PO2ART", "HCO3", "TCO2", "ACIDBASEDEF", "O2SAT"   Coagulation Profile: No results for input(s): "INR", "PROTIME" in the last 168 hours.  Cardiac Enzymes: No results for input(s): "CKTOTAL", "CKMB", "CKMBINDEX", "TROPONINI" in the last 168 hours.  HbA1C: No results found for: "HGBA1C"  CBG: No results for input(s): "GLUCAP" in the last 168 hours.  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 (08/02/2020), Arthritis of carpometacarpal [facility]) joint of left thumb (01/18/2020), Cervical dysplasia (1990), Degenerative disc disease, lumbar (05/02/2020), Generalized anxiety disorder, Herpes genitalis, Herpes labialis, History of calcium pyrophosphate deposition disease (CPPD) (03/26/2020), History of opiate abuse, in remission, History of small bowel obstruction (04/17/2018), Hyperlipidemia with target LDL less than 100 (06/17/2011), IBS (irritable bowel syndrome), Lumbago with sciatica, right side (02/10/2021), Major depressive disorder, Meibomian gland dysfunction (MGD) of both eyes (08/02/2020), Narcotic bowel syndrome (HCC), Osteopenia (04/2014), Raynaud's disease (06/17/2011), Recovering alcoholic  in remission , S/P LASIK (laser assisted in situ keratomileusis) (08/02/2020), Spondylolisthesis of lumbar region (02/10/2021), Status post total right knee replacement (10/31/2019), and Tubular adenoma of colon (04/21/2016).   Surgical History:   Past Surgical History:  Procedure Laterality Date   AUGMENTATION MAMMAPLASTY     BREAST ENHANCEMENT SURGERY  2006   CATARACT EXTRACTION Bilateral    Jan/Feb 2024   COSMETIC SURGERY     EYE SURGERY     lasik od   GYNECOLOGIC CRYOSURGERY  1990   LAPAROTOMY N/A 04/22/2018   Procedure: EXPLORATORY LAPAROTOMY;  Surgeon: Harriette Bouillon, MD;  Location: [facility] OR;  Service: General;  Laterality: N/A;   LIGAMENT REPAIR Right 09/24/2015   Procedure: RIGHT INDEX METACARPAL PHALANGEAL RADIAL COLLATERAL LIGAMENT REPAIR;  Surgeon: Betha Loa, MD;  Location: [facility];  Service: Orthopedics;  Laterality: Right;   MASS EXCISION N/A 08/04/2018   Procedure: REMOVAL OF SUTURE FROM ABDOMEN/ERAS PATHWAY;  Surgeon: Harriette Bouillon, MD;  Location: [facility];  Service: General;  Laterality: N/A;   OOPHORECTOMY     BSO   Right knee arthoscopy Right 09/09/2017   [facility]   SHX 1 Left 02/19/2020   basel cell carcinoma   SHX1 Right 12/11/2020   basal cell carinoma,micronodular pattern   SKIN BIOPSY  Right 02/28/2019   Superficial basal carcinoma    SKIN BIOPSY Left 11/24/2021   superficial basal carcinoma of the left forearm   TONSILLECTOMY AND ADENOIDECTOMY     TOTAL KNEE ARTHROPLASTY Right 04/12/2018   TOTAL KNEE ARTHROPLASTY Right 04/12/2018   Procedure: RIGHT TOTAL KNEE ARTHROPLASTY;  Surgeon: Marcene Corning, MD;  Location: [facility] OR;  Service: Orthopedics;  Laterality: Right;   TRANSFORAMINAL LUMBAR INTERBODY FUSION W/ MIS 1 LEVEL N/A 09/03/2021   Procedure: Lumbar four-five Minimally invasive transforaminal lumbar interbody fusion;  Surgeon: Dawley, Alan Mulder, DO;  Location: [facility] OR;  Service: Neurosurgery;  Laterality: N/A;    TUBAL LIGATION     UPPER GASTROINTESTINAL ENDOSCOPY  04/24/2005   VAGINAL HYSTERECTOMY  2005   [facility] BSO  adenomyosis     Social History:   reports that she quit smoking about 17 years ago. Her smoking use included cigarettes. She has a 40.00 pack-year smoking history. She has never used smokeless tobacco. She reports that she does not currently use alcohol. She reports that she does not currently use drugs after having used the following drugs: Oxycodone.   Family History:  Her family history includes Breast cancer (age of onset: 29) in her sister; Dementia in her mother; Hypertension in her daughter and mother; Lymphoma (age of onset: 57) in her father. There is no history of Colon cancer.   Allergies Allergies  Allergen Reactions   Buprenorphine Hcl Nausea And Vomiting   Morphine And Codeine Nausea Only     Home Medications  Prior to Admission medications   Medication Sig Start Date End Date Taking? Authorizing Provider  acetaminophen (TYLENOL) 500 MG tablet Take 1,000 mg by mouth every 6 (six) hours as needed for moderate pain.   Yes [provider]  bisacodyl (DULCOLAX) 5 MG EC tablet Take 1 tablet (5 mg total) by mouth daily as needed for moderate constipation. 04/14/18  Yes Elodia Florence, PA-C  gabapentin (NEURONTIN) 600 MG tablet Take 600-1,200 mg by mouth See admin instructions. Take 600 mg by mouth in the morning and 1200 mg at night   Yes [provider]  hydrocortisone 2.5 % cream Apply 1 Application topically 2 (two) times daily as needed (hemorrhoids).   Yes [provider]  ibuprofen (ADVIL) 800 MG tablet TAKE 1 TABLET(800 MG) BY MOUTH EVERY 8 HOURS AS NEEDED Patient taking differently: Take 800 mg by mouth daily. Take one tablet daily and as needed 03/02/22  Yes Ronnald Nian, MD  lamoTRIgine (LAMICTAL) 100 MG tablet Take 100 mg by mouth 2 (two) times daily.  05/19/15  Yes [provider]  Lotilaner (XDEMVY) 0.25 % SOLN Place 1 drop into  both eyes in the morning and at bedtime.   Yes [provider]  MAGNESIUM CITRATE PO Take 250 mg by mouth daily.   Yes [provider]  MOVANTIK 12.5 MG TABS tablet TAKE 1 TABLET(12.5MG ) BY MOUTH EVERY DAY 02/01/23  Yes Nandigam, Eleonore Chiquito, MD  Multiple Vitamins-Minerals (ADULT GUMMY PO) Take 1 each by mouth at bedtime. Vitamin B12 and Vitamin D gummy   Yes [provider]  polyethylene glycol (MIRALAX) 17 g packet Take 17 g by mouth daily. 10/03/20  Yes Erick Blinks, MD  prednisoLONE acetate (PRED FORTE) 1 % ophthalmic suspension Place 1 drop into both eyes 4 (four) times daily.   Yes [provider]  simvastatin (ZOCOR) 20 MG tablet TAKE 1 TABLET(20 MG) BY MOUTH DAILY 04/30/23  Yes Joselyn Arrow, MD  traZODone (DESYREL) 50  MG tablet Take 50 mg by mouth at bedtime. 10/23/19  Yes [provider]  VEVYE 0.1 % SOLN Apply 1 drop to eye 2 (two) times daily. 04/07/23  Yes [provider]  vitamin C (ASCORBIC ACID) 500 MG tablet Take 500 mg by mouth every evening.   Yes [provider]  zinc gluconate 50 MG tablet Take 50 mg by mouth every evening.   Yes [provider]     Critical care time: 35 minutes    Melody Comas, MD [facility] Office: [phone]   See Amion for personal pager PCCM on call pager [phone] until 7pm. Please call Elink 7p-7a. [phone]

## 2023-05-10 NOTE — H&P (Signed)
History and Physical    Patient: Bethany [name] ZOX:[id number] DOB: 02/01/1956 DOA: 05/10/2023 DOS: the patient was seen and examined on 05/10/2023 PCP: Joselyn Arrow, MD  Patient coming from: Home - lives with husband; NOK: Husband, [phone]   Chief Complaint: Abdominal pain  HPI: Bethany [name] is a 68 y.o. female with medical history significant of HLD, CPPD, and h/o ETOH/opiate abuse in remission presenting with abdominal pain.  She awoke this AM and had a BM, kind of loose.  After that, she started with upper abdominal pain and moved down.  This is the 4th SBO she has had since 2019.  +n/v.  No further passing gas.  She was quite somnolent during the visit and kept falling asleep without answering questions, but upon awakening reported severe pain and an inability to get into a comfortable position.    ER Course:  [facility] transfer:  Here with SBO, 3 prior admissions for the same.  Labs stable. Surgery will consult upon arrival. Symptoms stable.      Review of Systems: As mentioned in the history of present illness. All other systems reviewed and are negative. 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 All Paps normal afterwards   Degenerative disc disease, lumbar 05/02/2020   Generalized anxiety disorder    Herpes genitalis    Herpes labialis    History of calcium pyrophosphate deposition disease (CPPD) 03/26/2020   History of opiate abuse, in remission    History of small bowel obstruction 04/17/2018   partial   Hyperlipidemia with target LDL less than 100 06/17/2011   IBS (irritable bowel syndrome)    Lumbago with sciatica, right side 02/10/2021   Major depressive disorder     Meibomian gland dysfunction (MGD) of both eyes 08/02/2020   Narcotic bowel syndrome (HCC)    Osteopenia 04/2014   T score -1.7 FRAX 7.3%/0.8% stable from prior DEXA 2013   Raynaud's disease 06/17/2011   Recovering alcoholic in remission     S/P LASIK (laser assisted in situ keratomileusis) 08/02/2020   right eye   Spondylolisthesis of lumbar region 02/10/2021   Status post total right knee replacement 10/31/2019   Tubular adenoma of colon 04/21/2016   Past Surgical History:  Procedure Laterality Date   AUGMENTATION MAMMAPLASTY     BREAST ENHANCEMENT SURGERY  2006   CATARACT EXTRACTION Bilateral    Jan/Feb 2024   COSMETIC SURGERY     EYE SURGERY     lasik od   GYNECOLOGIC CRYOSURGERY  1990   LAPAROTOMY N/A 04/22/2018   Procedure: EXPLORATORY LAPAROTOMY;  Surgeon: Harriette Bouillon, MD;  Location: [facility] OR;  Service: General;  Laterality: N/A;   LIGAMENT REPAIR Right 09/24/2015   Procedure: RIGHT INDEX METACARPAL PHALANGEAL RADIAL COLLATERAL LIGAMENT REPAIR;  Surgeon: Betha Loa, MD;  Location: [facility];  Service: Orthopedics;  Laterality: Right;   MASS EXCISION N/A 08/04/2018   Procedure: REMOVAL OF SUTURE FROM ABDOMEN/ERAS PATHWAY;  Surgeon: Harriette Bouillon, MD;  Location: [facility];  Service: General;  Laterality: N/A;   OOPHORECTOMY     BSO   Right knee arthoscopy Right 09/09/2017   [facility]   SHX 1 Left 02/19/2020   basel cell carcinoma  [facility] Right 12/11/2020   basal cell carinoma,micronodular pattern   SKIN BIOPSY Right 02/28/2019   Superficial basal carcinoma    SKIN BIOPSY Left 11/24/2021   superficial basal carcinoma of the left forearm   TONSILLECTOMY AND ADENOIDECTOMY     TOTAL KNEE ARTHROPLASTY Right 04/12/2018   TOTAL KNEE ARTHROPLASTY Right 04/12/2018   Procedure: RIGHT TOTAL KNEE ARTHROPLASTY;  Surgeon: Marcene Corning, MD;  Location: [facility] OR;  Service: Orthopedics;  Laterality: Right;   TRANSFORAMINAL LUMBAR INTERBODY FUSION  W/ MIS 1 LEVEL N/A 09/03/2021   Procedure: Lumbar four-five Minimally invasive transforaminal lumbar interbody fusion;  Surgeon: Dawley, Alan Mulder, DO;  Location: [facility] OR;  Service: Neurosurgery;  Laterality: N/A;   TUBAL LIGATION     UPPER GASTROINTESTINAL ENDOSCOPY  04/24/2005   VAGINAL HYSTERECTOMY  2005   TVH BSO  adenomyosis   Social History:  reports that she quit smoking about 17 years ago. Her smoking use included cigarettes. She has a 40.00 pack-year smoking history. She has never used smokeless tobacco. She reports that she does not currently use alcohol. She reports that she does not currently use drugs after having used the following drugs: Oxycodone.  Allergies  Allergen Reactions   Buprenorphine Hcl Nausea And Vomiting   Morphine And Codeine Nausea Only    Family History  Problem Relation Age of Onset   Hypertension Mother    Dementia Mother        rapid decline; concerns for Creutzfeldt-Jakob disease   Lymphoma Father 28   Breast cancer Sister 33   Hypertension Daughter    Colon cancer Neg Hx     Prior to Admission medications   Medication Sig Start Date End Date Taking? Authorizing Provider  acetaminophen (TYLENOL) 500 MG tablet Take 1,000 mg by mouth every 6 (six) hours as needed for moderate pain. Patient not taking: Reported on 04/28/2023    [provider]  bisacodyl (DULCOLAX) 5 MG EC tablet Take 1 tablet (5 mg total) by mouth daily as needed for moderate constipation. 04/14/18   Elodia Florence, PA-C  Cyanocobalamin (VITAMIN B-12 PO) Take 5,000 mg by mouth daily.    [provider]  gabapentin (NEURONTIN) 600 MG tablet Take 600-1,200 mg by mouth See admin instructions. Take 600 mg by mouth in the morning and 1200 mg at night    [provider]  hydrocortisone 2.5 % cream Apply 1 Application topically 2 (two) times daily as needed (itching). Patient not taking: Reported on 04/28/2023    [provider]  ibuprofen (ADVIL) 800 MG tablet  TAKE 1 TABLET(800 MG) BY MOUTH EVERY 8 HOURS AS NEEDED 03/02/22   Ronnald Nian, MD  lamoTRIgine (LAMICTAL) 100 MG tablet Take 100 mg by mouth 2 (two) times daily.  05/19/15   [provider]  MAGNESIUM CITRATE PO Take 250 mg by mouth daily.    [provider]  MOVANTIK 12.5 MG TABS tablet TAKE 1 TABLET(12.5MG ) BY MOUTH EVERY DAY 02/01/23   Napoleon Form, MD  Multiple Vitamin (MULTIVITAMIN ADULT) TABS Take 1 tablet by mouth every evening.    [provider]  polyethylene glycol (MIRALAX) 17 g packet Take 17 g by mouth daily. 10/03/20   Erick Blinks, MD  simvastatin (ZOCOR) 20 MG tablet TAKE 1 TABLET(20 MG) BY MOUTH DAILY 04/30/23   Joselyn Arrow, MD  traZODone (DESYREL) 50 MG tablet Take 50 mg by mouth at bedtime. 10/23/19   [provider]  VEVYE 0.1 % SOLN Apply 1 drop to eye  2 (two) times daily. 04/07/23   [provider]  vitamin C (ASCORBIC ACID) 500 MG tablet Take 500 mg by mouth every evening.    [provider]  VITAMIN D, CHOLECALCIFEROL, PO Take 5,000 Int'l Units by mouth daily.    [provider]  XDEMVY 0.25 % SOLN Apply 1 drop to eye 2 (two) times daily. 04/08/23   [provider]  zinc gluconate 50 MG tablet Take 50 mg by mouth every evening.    [provider]    Physical Exam: Vitals:   05/10/23 0815 05/10/23 0845 05/10/23 0930 05/10/23 1016  BP: 131/61 (!) 114/57 125/61 122/73  Pulse: 72 84 76 70  Resp:    18  Temp:    97.7 F (36.5 C)  TempSrc:    Oral  SpO2: 100% 100% 100% 94%  Weight:      Height:       General:  Appears calm and comfortable and is in NAD Eyes:   EOMI, normal lids, iris ENT:  grossly normal hearing, lips & tongue, mmm Neck:  no LAD, masses or thyromegaly Cardiovascular:  RRR, no m/r/g. No LE edema.  Respiratory:   CTA bilaterally with no wheezes/rales/rhonchi.  Normal respiratory effort. Abdomen:  soft, NT, ND Skin:  no rash or induration seen on limited  exam Musculoskeletal:  grossly normal tone BUE/BLE, good ROM, no bony abnormality Psychiatric:  somnolent mood and affect, speech fluent and appropriate, AOx3 Neurologic:  CN 2-12 grossly intact, moves all extremities in coordinated fashion   Radiological Exams on Admission: Independently reviewed - see discussion in A/P where applicable  CT ABDOMEN PELVIS W CONTRAST  Result Date: 05/10/2023 CLINICAL DATA:  67 year old female with history of acute onset of nonlocalized abdominal pain. Prior history of small-bowel obstruction. EXAM: CT ABDOMEN AND PELVIS WITH CONTRAST TECHNIQUE: Multidetector CT imaging of the abdomen and pelvis was performed using the standard protocol following bolus administration of intravenous contrast. RADIATION DOSE REDUCTION: This exam was performed according to the departmental dose-optimization program which includes automated exposure control, adjustment of the mA and/or kV according to patient size and/or use of iterative reconstruction technique. CONTRAST:  75mL OMNIPAQUE IOHEXOL 300 MG/ML  SOLN COMPARISON:  CT of the abdomen and pelvis 06/16/2022. FINDINGS: Lower chest: Bilateral breast implants with dense capsular calcifications incidentally noted. Hepatobiliary: No suspicious cystic or solid hepatic lesions. No intra or extrahepatic biliary ductal dilatation. Gallbladder is unremarkable in appearance. Pancreas: No pancreatic mass. No pancreatic ductal dilatation. No pancreatic or peripancreatic fluid collections or inflammatory changes. Spleen: Unremarkable. Adrenals/Urinary Tract: 2 mm nonobstructive calculus in the lower pole collecting system of the left kidney. Bilateral kidneys and adrenal glands are otherwise normal in appearance. No hydroureteronephrosis. Urinary bladder is nearly completely decompressed, but otherwise unremarkable in appearance. Stomach/Bowel: The appearance of the stomach is normal. Suture line in the right lower quadrant of the abdomen, likely from  partial small bowel resection. In the left lower quadrant (axial image 53 of series 2) there is a dilated loop of small bowel measuring up to 4 cm in diameter with internal air-fluid levels. No generalized small bowel dilatation is otherwise noted. Colon appears relatively decompressed, although there is some gas and stool throughout the sigmoid colon. Appendix is not confidently identified. Vascular/Lymphatic: Atherosclerotic calcifications in the abdominal aorta. No aneurysm or dissection noted in the abdominal or pelvic vasculature. No lymphadenopathy noted in the abdomen or pelvis. Reproductive: Status post hysterectomy. Ovaries are not confidently identified may be surgically absent or atrophic.  Other: No significant volume of ascites.  No pneumoperitoneum. Musculoskeletal: There are no aggressive appearing lytic or blastic lesions noted in the visualized portions of the skeleton. Postoperative changes of PLIF at L4-L5 with interbody cage at the L4-L5 interspace and 8 mm of anterolisthesis of L4 upon L5 noted. IMPRESSION: 1. Dilated small bowel loop in the left lower quadrant of the abdomen, with air-fluid level in this region. No proximal dilatation of small bowel or stomach otherwise noted. However, the possibility of earlier partial small bowel obstruction should be considered, potentially from adhesions. Surgical consultation is recommended. 2. 2 mm nonobstructive calculus in the lower pole collecting system of the left kidney. 3. Additional postoperative changes and incidental findings, as above. Electronically Signed   By: Trudie Reed M.D.   On: 05/10/2023 06:46    EKG: not done   Labs on Admission: I have personally reviewed the available labs and imaging studies at the time of the admission.  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    SBO (small bowel obstruction) (HCC)   Mood disorder (HCC)    SBO -Patient with prior h/o abdominal surgeries and recurrent SBO presenting with acute onset of abdominal pain with n/v, abdominal distention, and CT findings c/w SBO -Will admit on Med Surg -Gen Surg consulted by ER and will see; currently no indication for surgical intervention -80% of SBO will resolve without surgery -High-grade SBO can usually be safely managed non-operatively -If PO contrast reaches colon within 24 hours, SBO will very likely certainly resolve without surgery -NPO for bowel rest -NG tube if needed, but she currently does not appear to need this -IVF hydration -Pain control with morphine -Current guidelines recommend that patients without resolution undergo surgery by 3-5 days  HLD -Continue simvastatin - resume tomorrow  Mood d/o -Continue lamotrigine, gabapentin, trazodone  Anterior basement membrane dystrophy of eyes -Continue Lotilaner, prednisolone  DNR -I have discussed code status with the patient and she would not desire resuscitation and would prefer to die a natural death should that situation arise. -Of note, this is not what her current ACP documents say and so this may need to be revisited as an outpatient     Advance Care Planning:   Code Status: DNR   Consults: Surgery  DVT Prophylaxis: SCDs  Family Communication: None present; she declined to have me call family at the time of admission  Severity of Illness: The appropriate patient status for this patient is INPATIENT. Inpatient status is judged to be reasonable and necessary in order to provide the required intensity of service to ensure the patient's safety. The patient's presenting symptoms, physical exam findings, and initial radiographic and laboratory data in the context of their chronic comorbidities is felt to place them at high risk for further clinical deterioration. Furthermore, it is not anticipated that the patient  will be medically stable for discharge from the hospital within 2 midnights of admission.   * I certify that at the point of admission it is my clinical judgment that the patient will require inpatient hospital care spanning beyond 2 midnights from the point of admission due to high intensity of service, high risk for further deterioration and high frequency of surveillance required.*  Author: Jonah Blue, MD 05/10/2023 1:46 PM  For on call review [email or web address].

## 2023-05-10 NOTE — Progress Notes (Signed)
Patient unresponsive with secretions coming from her mouth, suction initiated, RRT was called, Hospitalist Dr Ophelia Charter was notified.

## 2023-05-10 NOTE — Significant Event (Signed)
Rapid Response Event Note   Reason for Call :  unresponsive  Initial Focused Assessment:  Entered room, pt diaphoretic, agonally breathing with a pulse, secretions at mouth/nose. BVM brought to room and immediately began assisting breathing with chest rise, O2 slow to respond. Code called at this time, noted pt DNR.   136/80 HR 81 Agonal  O2 35-45% CBG 200s  Interventions:  BVM Narcan 0.4mg  IV  PIV Telemetry monitoring CXR d/t suspected aspiration  Plan of Care:  Tx PCU for closer monitoring. Call back for further needs.  Event Summary:  MD Notified: Basil Dess MD,  Call Time: 92 Arrival Time: 1750 End Time: 1830  Truddie Crumble, RN

## 2023-05-10 NOTE — ED Triage Notes (Signed)
Woke up this morning ~415am with periumbilical abdominal pain and nausea. Says she has had multiple SBO with similar presentation.   Went to use restroom and pain intensified. Nausea but no vomiting.

## 2023-05-10 NOTE — ED Provider Notes (Signed)
  Provider Note MRN:  [id number]  Arrival date & time: 05/10/23    ED Course and Medical Decision Making  Assumed care from [place] at shift change.  See note from prior team for complete details, in brief:  27 y female Hx recurrent SBO (2019, 2020, 2023) Sudden onset pain around 2 hours PTA Nausea w/o vomiting Abd distension Had some diarrhea liquid but no formed stool last 24 hours Labs stable CTAP with concern for SBO  Plan per prior physician call surgery  Clinical Course as of 05/10/23 0735  Mon May 10, 2023  0720 Spoke w/ Dr Luisa Hart, recommend [facility] call on pt arrival and they can eval [SG]    Clinical Course User Index [SG] Sloan Leiter, DO    Admitted [facility]    Procedures  Final Clinical Impressions(s) / ED Diagnoses     ICD-10-CM   1. SBO (small bowel obstruction) (HCC)  K56.609     2. Generalized abdominal pain  R10.84       ED Discharge Orders     None       Discharge Instructions   None        Sloan Leiter, DO 05/10/23 [id number]

## 2023-05-10 NOTE — Progress Notes (Signed)
IV team RN x 2 arrived to room after code blue page. No new access needed at this time.

## 2023-05-10 NOTE — Consult Note (Signed)
Bethany [name] [birth year]  [id number].    Requesting MD: Dr. Jonah Blue Chief Complaint/Reason for Consult: SBO  HPI: Bethany [name] is a 67 y.o. female who presented to [facility] ED for abdominal pain on 7/8.  Reports around 4:15 AM this morning she woke up and had a BM.  About 10 minutes after this she started having lower abdominal pain along with some mild nausea and vomiting.  No flatus or further BMs since this time.  This felt similar to her prior SBO she presented to the ED for evaluation.   In the ED she was afebrile without tachycardia or systolic hypertension.  WBC 5.7.  CT A/P c/w possible SBO with some dilated small bowel loops in the LLQ.  Patient with history of multiple SBO's in the past.  She required ex lap, LOA, SBR for chronically stenosed distal ileum secondary to longstanding adhesions by Dr. Luisa Hart in 2019.  She had another SBO in 2020 and 2023 that resolved with conservative management.  Last Colonoscopy: 2023. This showed one benign polyp in the transverse colon and was otherwise reassuring except external and internal hemorrhoids Prior Abdominal Surgeries: Tubal ligation. Hysterectomy with bilateral oophorectomy. Ex lap, LOA, SBR in 2019.  Blood Thinners: None Tobacco Use: None currently. Quit in 2007. 40 pack year hx.  Alcohol Use: Previous. 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 All Paps normal afterwards   Degenerative disc disease, lumbar 05/02/2020   Dyslipidemia    Generalized anxiety disorder    Herpes genitalis    Herpes labialis    History of calcium pyrophosphate deposition disease (CPPD) 03/26/2020   History of opiate abuse, in remission    History of small bowel obstruction 04/17/2018   partial   Hyperlipidemia with target LDL less than 100 06/17/2011   IBS (irritable bowel syndrome)    Lumbago with sciatica, right side 02/10/2021   Major depressive disorder    Meibomian gland dysfunction (MGD) of both eyes 08/02/2020   Narcotic bowel syndrome (HCC)    Osteopenia 04/2014   T score -1.7 FRAX 7.3%/0.8% stable from prior DEXA 2013   Raynaud's disease 06/17/2011   Recovering alcoholic in remission     S/P LASIK (laser assisted in situ keratomileusis) 08/02/2020   right eye   Spondylolisthesis of lumbar region 02/10/2021   Status post total right knee replacement 10/31/2019   Tubular adenoma of colon 04/21/2016    Past Surgical History:  Procedure Laterality Date   AUGMENTATION MAMMAPLASTY     BREAST ENHANCEMENT SURGERY  2006   CATARACT EXTRACTION Bilateral    Jan/Feb 2024   COSMETIC SURGERY     EYE SURGERY     lasik od   GYNECOLOGIC CRYOSURGERY  1990   LAPAROTOMY N/A 04/22/2018   Procedure: EXPLORATORY LAPAROTOMY;  Surgeon: Harriette Bouillon,  MD;  Location: [facility] OR;  Service: General;  Laterality: N/A;   LIGAMENT REPAIR Right 09/24/2015   Procedure: RIGHT INDEX METACARPAL PHALANGEAL RADIAL COLLATERAL LIGAMENT REPAIR;  Surgeon: Betha Loa, MD;  Location: [facility];  Service: Orthopedics;  Laterality: Right;   MASS EXCISION N/A 08/04/2018   Procedure: REMOVAL OF SUTURE FROM ABDOMEN/ERAS PATHWAY;  Surgeon: Harriette Bouillon, MD;  Location: [facility];  Service: General;  Laterality: N/A;   OOPHORECTOMY     BSO   Right knee arthoscopy Right 09/09/2017   [facility]   SHX 1 Left 02/19/2020    basel cell carcinoma   SHX1 Right 12/11/2020   basal cell carinoma,micronodular pattern   SKIN BIOPSY Right 02/28/2019   Superficial basal carcinoma    SKIN BIOPSY Left 11/24/2021   superficial basal carcinoma of the left forearm   TONSILLECTOMY AND ADENOIDECTOMY     TOTAL KNEE ARTHROPLASTY Right 04/12/2018   TOTAL KNEE ARTHROPLASTY Right 04/12/2018   Procedure: RIGHT TOTAL KNEE ARTHROPLASTY;  Surgeon: Marcene Corning, MD;  Location: [facility] OR;  Service: Orthopedics;  Laterality: Right;   TRANSFORAMINAL LUMBAR INTERBODY FUSION W/ MIS 1 LEVEL N/A 09/03/2021   Procedure: Lumbar four-five Minimally invasive transforaminal lumbar interbody fusion;  Surgeon: Dawley, Alan Mulder, DO;  Location: [facility] OR;  Service: Neurosurgery;  Laterality: N/A;   TUBAL LIGATION     UPPER GASTROINTESTINAL ENDOSCOPY  04/24/2005   VAGINAL HYSTERECTOMY  2005   [facility] BSO  adenomyosis    Social History:  reports that she quit smoking about 17 years ago. Her smoking use included cigarettes. She has a 40.00 pack-year smoking history. She has never used smokeless tobacco. She reports that she does not currently use alcohol. She reports that she does not currently use drugs after having used the following drugs: Oxycodone.  Allergies:  Allergies  Allergen Reactions   Buprenorphine Hcl Nausea And Vomiting   Morphine And Codeine Nausea Only    Facility-Administered Medications Prior to Admission  Medication Dose Route Frequency Provider Last Rate Last Admin   [DISCONTINUED] 0.9 %  sodium chloride infusion  500 mL Intravenous Once Nandigam, Eleonore Chiquito, MD       Medications Prior to Admission  Medication Sig Dispense Refill   acetaminophen (TYLENOL) 500 MG tablet Take 1,000 mg by mouth every 6 (six) hours as needed for moderate pain. (Patient not taking: Reported on 04/28/2023)     bisacodyl (DULCOLAX) 5 MG EC tablet Take 1 tablet (5 mg total) by mouth daily as needed for moderate constipation. 15 tablet 0   Cyanocobalamin  (VITAMIN B-12 PO) Take 5,000 mg by mouth daily.     gabapentin (NEURONTIN) 600 MG tablet Take 600-1,200 mg by mouth See admin instructions. Take 600 mg by mouth in the morning and 1200 mg at night     hydrocortisone 2.5 % cream Apply 1 Application topically 2 (two) times daily as needed (itching). (Patient not taking: Reported on 04/28/2023)     ibuprofen (ADVIL) 800 MG tablet TAKE 1 TABLET(800 MG) BY MOUTH EVERY 8 HOURS AS NEEDED 90 tablet 0   lamoTRIgine (LAMICTAL) 100 MG tablet Take 100 mg by mouth 2 (two) times daily.   0   MAGNESIUM CITRATE PO Take 250 mg by mouth daily.     MOVANTIK 12.5 MG TABS tablet TAKE 1 TABLET(12.5MG ) BY MOUTH EVERY DAY 30 tablet 6   Multiple Vitamin (MULTIVITAMIN ADULT) TABS Take 1 tablet by mouth every evening.     polyethylene glycol (MIRALAX) 17 g  packet Take 17 g by mouth daily. 14 each 0   simvastatin (ZOCOR) 20 MG tablet TAKE 1 TABLET(20 MG) BY MOUTH DAILY 90 tablet 3   traZODone (DESYREL) 50 MG tablet Take 50 mg by mouth at bedtime.     VEVYE 0.1 % SOLN Apply 1 drop to eye 2 (two) times daily.     vitamin C (ASCORBIC ACID) 500 MG tablet Take 500 mg by mouth every evening.     VITAMIN D, CHOLECALCIFEROL, PO Take 5,000 Int'l Units by mouth daily.     XDEMVY 0.25 % SOLN Apply 1 drop to eye 2 (two) times daily.     zinc gluconate 50 MG tablet Take 50 mg by mouth every evening.       Physical Exam: Blood pressure 122/73, pulse 70, temperature 97.7 F (36.5 C), temperature source Oral, resp. rate 18, height 5\' 4"  (1.626 m), weight 52.2 kg, SpO2 94 %. General: pleasant, WD/WN female who is laying in bed in NAD HEENT: head is normocephalic, atraumatic.   Heart: regular, rate, and rhythm.   Lungs: Respiratory effort nonlabored Abd: Minimal lower abdominal distention, minimal ttp without rigidity or guarding. Hypoactive BS. No masses, hernias, or organomegaly. Prior midline scar well healed.  MS: no BUE or BLE edema Skin: warm and dry  Psych: A&Ox4 with an  appropriate affect   Results for orders placed or performed during the hospital encounter of 05/10/23 (from the past 48 hour(s))  Lipase, blood     Status: None   Collection Time: 05/10/23  5:37 AM  Result Value Ref Range   Lipase 27 11 - 51 U/L    Comment: Performed at [facility], [address] [place], [place] [address]  Comprehensive metabolic panel     Status: Abnormal   Collection Time: 05/10/23  5:37 AM  Result Value Ref Range   Sodium 140 135 - 145 mmol/L   Potassium 3.9 3.5 - 5.1 mmol/L   Chloride 101 98 - 111 mmol/L   CO2 30 22 - 32 mmol/L   Glucose, Bld 123 (H) 70 - 99 mg/dL    Comment: Glucose reference range applies only to samples taken after fasting for at least 8 hours.   BUN 16 8 - 23 mg/dL   Creatinine, Ser 6.04 0.44 - 1.00 mg/dL   Calcium 54.0 8.9 - 98.1 mg/dL   Total Protein 7.0 6.5 - 8.1 g/dL   Albumin 4.7 3.5 - 5.0 g/dL   AST 31 15 - 41 U/L   ALT 24 0 - 44 U/L   Alkaline Phosphatase 59 38 - 126 U/L   Total Bilirubin 0.4 0.3 - 1.2 mg/dL   GFR, Estimated >19 >14 mL/min    Comment: (NOTE) Calculated using the CKD-EPI Creatinine Equation (2021)    Anion gap 9 5 - 15    Comment: Performed at [facility], [address], [place], [place] [address]  CBC with Differential     Status: Abnormal   Collection Time: 05/10/23  5:37 AM  Result Value Ref Range   WBC 5.7 4.0 - 10.5 K/uL   RBC 4.04 3.87 - 5.11 MIL/uL   Hemoglobin 13.7 12.0 - 15.0 g/dL   HCT 62.1 30.8 - 65.7 %   MCV 102.0 (H) 80.0 - 100.0 fL   MCH 33.9 26.0 - 34.0 pg   MCHC 33.3 30.0 - 36.0 g/dL   RDW 84.6 96.2 - 95.2 %   Platelets 192 150 - 400 K/uL   nRBC 0.0 0.0 -  0.2 %   Neutrophils Relative % 47 %   Neutro Abs 2.7 1.7 - 7.7 K/uL   Lymphocytes Relative 41 %   Lymphs Abs 2.3 0.7 - 4.0 K/uL   Monocytes Relative 7 %   Monocytes Absolute 0.4 0.1 - 1.0 K/uL   Eosinophils Relative 4 %   Eosinophils Absolute 0.2 0.0 - 0.5 K/uL   Basophils Relative 1 %    Basophils Absolute 0.1 0.0 - 0.1 K/uL   Immature Granulocytes 0 %   Abs Immature Granulocytes 0.01 0.00 - 0.07 K/uL    Comment: Performed at [facility], [address] [place], [place] [address]   CT ABDOMEN PELVIS W CONTRAST  Result Date: 05/10/2023 CLINICAL DATA:  67 year old female with history of acute onset of nonlocalized abdominal pain. Prior history of small-bowel obstruction. EXAM: CT ABDOMEN AND PELVIS WITH CONTRAST TECHNIQUE: Multidetector CT imaging of the abdomen and pelvis was performed using the standard protocol following bolus administration of intravenous contrast. RADIATION DOSE REDUCTION: This exam was performed according to the departmental dose-optimization program which includes automated exposure control, adjustment of the mA and/or kV according to patient size and/or use of iterative reconstruction technique. CONTRAST:  75mL OMNIPAQUE IOHEXOL 300 MG/ML  SOLN COMPARISON:  CT of the abdomen and pelvis 06/16/2022. FINDINGS: Lower chest: Bilateral breast implants with dense capsular calcifications incidentally noted. Hepatobiliary: No suspicious cystic or solid hepatic lesions. No intra or extrahepatic biliary ductal dilatation. Gallbladder is unremarkable in appearance. Pancreas: No pancreatic mass. No pancreatic ductal dilatation. No pancreatic or peripancreatic fluid collections or inflammatory changes. Spleen: Unremarkable. Adrenals/Urinary Tract: 2 mm nonobstructive calculus in the lower pole collecting system of the left kidney. Bilateral kidneys and adrenal glands are otherwise normal in appearance. No hydroureteronephrosis. Urinary bladder is nearly completely decompressed, but otherwise unremarkable in appearance. Stomach/Bowel: The appearance of the stomach is normal. Suture line in the right lower quadrant of the abdomen, likely from partial small bowel resection. In the left lower quadrant (axial image 53 of series 2) there is a dilated loop of small  bowel measuring up to 4 cm in diameter with internal air-fluid levels. No generalized small bowel dilatation is otherwise noted. Colon appears relatively decompressed, although there is some gas and stool throughout the sigmoid colon. Appendix is not confidently identified. Vascular/Lymphatic: Atherosclerotic calcifications in the abdominal aorta. No aneurysm or dissection noted in the abdominal or pelvic vasculature. No lymphadenopathy noted in the abdomen or pelvis. Reproductive: Status post hysterectomy. Ovaries are not confidently identified may be surgically absent or atrophic. Other: No significant volume of ascites.  No pneumoperitoneum. Musculoskeletal: There are no aggressive appearing lytic or blastic lesions noted in the visualized portions of the skeleton. Postoperative changes of PLIF at L4-L5 with interbody cage at the L4-L5 interspace and 8 mm of anterolisthesis of L4 upon L5 noted. IMPRESSION: 1. Dilated small bowel loop in the left lower quadrant of the abdomen, with air-fluid level in this region. No proximal dilatation of small bowel or stomach otherwise noted. However, the possibility of earlier partial small bowel obstruction should be considered, potentially from adhesions. Surgical consultation is recommended. 2. 2 mm nonobstructive calculus in the lower pole collecting system of the left kidney. 3. Additional postoperative changes and incidental findings, as above. Electronically Signed   By: Trudie Reed M.D.   On: 05/10/2023 06:46    Anti-infectives (From admission, onward)    None       Assessment/Plan SBO - CT w/ possible SBO with some dilated small  bowel loops in the LLQ. Patient with history of multiple SBO's in the past.  She required ex lap, LOA, SBR for chronically stenosed distal ileum secondary to longstanding adhesions by Dr. Luisa Hart in 2019.  She also had SBO's in 2020 and 2023  that resolved with conservative management. - HDS without fever, tachycardia or  hypotension. No peritonitis on exam. WBC wnl. No current indication for emergency surgery - keep NPO - Start SBO protocol and allow her to drink the contrast since she hasn't had any vomiting recently - Keep K > 4, Mg > 2 and mobilize as able for bowel function - Hopefully patient will improve with conservative management. If patient fails to improve with conservative management, they may require exploratory surgery during admission - Agree with medical admission. We will follow with you.  FEN - NPO, NGT to LIWS, IVF VTE - SCDs, okay for chem ppx from our standpoint ID - None  I reviewed nursing notes, ED provider notes, last 24 h vitals and pain scores, last 48 h intake and output, last 24 h labs and trends, and last 24 h imaging results.  Letha Cape, [facility] Surgery 05/10/2023, 11:16 AM Please see Amion for pager number during day hours 7:00am-4:30pm

## 2023-05-11 ENCOUNTER — Inpatient Hospital Stay (HOSPITAL_COMMUNITY): Payer: Medicare Other

## 2023-05-11 DIAGNOSIS — Z4682 Encounter for fitting and adjustment of non-vascular catheter: Secondary | ICD-10-CM | POA: Diagnosis not present

## 2023-05-11 DIAGNOSIS — K56609 Unspecified intestinal obstruction, unspecified as to partial versus complete obstruction: Secondary | ICD-10-CM | POA: Diagnosis not present

## 2023-05-11 LAB — BASIC METABOLIC PANEL
Anion gap: 9 (ref 5–15)
BUN: 17 mg/dL (ref 8–23)
CO2: 29 mmol/L (ref 22–32)
Calcium: 9.1 mg/dL (ref 8.9–10.3)
Chloride: 105 mmol/L (ref 98–111)
Creatinine, Ser: 1.04 mg/dL — ABNORMAL HIGH (ref 0.44–1.00)
GFR, Estimated: 59 mL/min — ABNORMAL LOW (ref >60)
Glucose, Bld: 118 mg/dL — ABNORMAL HIGH (ref 70–99)
Potassium: 4.5 mmol/L (ref 3.5–5.1)
Sodium: 143 mmol/L (ref 135–145)

## 2023-05-11 LAB — CBC
HCT: 38.8 % (ref 36.0–46.0)
Hemoglobin: 12.7 g/dL (ref 12.0–15.0)
MCH: 33.5 pg (ref 26.0–34.0)
MCHC: 32.7 g/dL (ref 30.0–36.0)
MCV: 102.4 fL — ABNORMAL HIGH (ref 80.0–100.0)
Platelets: 181 10*3/uL (ref 150–400)
RBC: 3.79 MIL/uL — ABNORMAL LOW (ref 3.87–5.11)
RDW: 12.6 % (ref 11.5–15.5)
WBC: 14.8 10*3/uL — ABNORMAL HIGH (ref 4.0–10.5)
nRBC: 0 % (ref 0.0–0.2)

## 2023-05-11 MED ORDER — METOPROLOL TARTRATE 5 MG/5ML IV SOLN: 5.0000 mg | INTRAVENOUS | Status: DC | PRN

## 2023-05-11 MED ORDER — HYDROMORPHONE HCL 1 MG/ML IJ SOLN
0.5000 mg | INTRAMUSCULAR | Status: DC | PRN
  Administered 2023-05-11 – 2023-05-16 (×16): 0.5 mg via INTRAVENOUS
  Filled 2023-05-11 (×17): qty 0.5

## 2023-05-11 MED ORDER — IPRATROPIUM-ALBUTEROL 0.5-2.5 (3) MG/3ML IN SOLN: 3.0000 mL | RESPIRATORY_TRACT | Status: DC | PRN

## 2023-05-11 MED ORDER — TRAZODONE HCL 50 MG PO TABS
50.0000 mg | ORAL_TABLET | Freq: Every evening | ORAL | Status: DC | PRN
  Administered 2023-05-23: 50 mg via ORAL
  Filled 2023-05-11: qty 1

## 2023-05-11 MED ORDER — SENNOSIDES-DOCUSATE SODIUM 8.6-50 MG PO TABS
1.0000 | ORAL_TABLET | Freq: Every evening | ORAL | Status: DC | PRN
  Administered 2023-05-22: 1 via ORAL
  Filled 2023-05-11: qty 1

## 2023-05-11 MED ORDER — GUAIFENESIN 100 MG/5ML PO LIQD: 5.0000 mL | ORAL | Status: DC | PRN

## 2023-05-11 MED ORDER — DEXTROSE-SODIUM CHLORIDE 5-0.45 % IV SOLN
INTRAVENOUS | Status: AC
  Administered 2023-05-11: 75 mL/h via INTRAVENOUS

## 2023-05-11 NOTE — Progress Notes (Signed)
[facility] Progress Note     Subjective: CC:  Says she took too much pain medicine last night. Denies flatus or BM. Nausea better since NG placed  Objective: Vital signs in last 24 hours: Temp:  [97.4 F (36.3 C)-98.2 F (36.8 C)] 97.6 F (36.4 C) (07/09 0855) Pulse Rate:  [76-88] 76 (07/09 0855) Resp:  [4-18] 18 (07/09 0855) BP: (95-158)/(72-85) 152/75 (07/09 0855) SpO2:  [38 %-100 %] 100 % (07/09 1132) Last BM Date : 05/10/23  Intake/Output from previous day: 07/08 0701 - 07/09 0700 In: 569.2 [I.V.:569.2] Out: 450 [Urine:250; Emesis/NG output:200] Intake/Output this shift: No intake/output data recorded.  PE: Gen:  somnolent, arouses to voice. Card:  Regular rate and rhythm Pulm:  Normal effort ORA Abd: Soft, non-tender, non-distended, previous laparotomy scar appears well-healing Skin: warm and dry, no rashes  Psych: A&Ox3   Lab Results:  Recent Labs    05/10/23 0537 05/11/23 0257  WBC 5.7 14.8*  HGB 13.7 12.7  HCT 41.2 38.8  PLT 192 181   BMET Recent Labs    05/10/23 0537 05/11/23 0257  NA 140 143  K 3.9 4.5  CL 101 105  CO2 30 29  GLUCOSE 123* 118*  BUN 16 17  CREATININE 0.98 1.04*  CALCIUM 10.0 9.1   PT/INR No results for input(s): "LABPROT", "INR" in the last 72 hours. CMP     Component Value Date/Time   NA 143 05/11/2023 0257   NA 140 04/29/2023 0810   K 4.5 05/11/2023 0257   CL 105 05/11/2023 0257   CO2 29 05/11/2023 0257   GLUCOSE 118 (H) 05/11/2023 0257   BUN 17 05/11/2023 0257   BUN 17 04/29/2023 0810   CREATININE 1.04 (H) 05/11/2023 0257   CREATININE 1.05 (H) 03/26/2017 0929   CALCIUM 9.1 05/11/2023 0257   PROT 7.0 05/10/2023 0537   PROT 6.9 04/29/2023 0810   ALBUMIN 4.7 05/10/2023 0537   ALBUMIN 4.8 04/29/2023 0810   AST 31 05/10/2023 0537   ALT 24 05/10/2023 0537   ALKPHOS 59 05/10/2023 0537   BILITOT 0.4 05/10/2023 0537   BILITOT 0.3 04/29/2023 0810   GFRNONAA 59 (L) 05/11/2023 0257   GFRAA 83  10/18/2020 1227   Lipase     Component Value Date/Time   LIPASE 27 05/10/2023 0537       Studies/Results: DG Abd Portable 1V  Result Date: 05/11/2023 CLINICAL DATA:  [id number] Encounter for nasogastric (NG) tube placement [id number] EXAM: PORTABLE ABDOMEN - 1 VIEW COMPARISON:  Radiograph from yesterday. FINDINGS: Interval placement of enteric tube with its tip and side hole overlying the left upper quadrant, within the proximal stomach. The bowel gas pattern is non-obstructive. No evidence of pneumoperitoneum. No acute osseous abnormalities. The soft tissues are within normal limits. Bilateral breast implants noted. Partially seen lumbar spinal fixation hardware. There are multiple monitoring electrodes overlying the chest and upper abdomen. IMPRESSION: *Enteric tube tip and side hole overlying the proximal stomach. Electronically Signed   By: Jules Schick M.D.   On: 05/11/2023 08:33   DG Abd Portable 1V-Small Bowel Obstruction Protocol-initial, 8 hr delay  Result Date: 05/10/2023 CLINICAL DATA:  Small bowel obstruction, 8 hour delay EXAM: PORTABLE ABDOMEN - 1 VIEW COMPARISON:  CT 05/10/2023 FINDINGS: Contrast present within the stomach and dilated loops of small bowel. No definite contrast is present within the colon. Hardware in the lower spine. IMPRESSION: Contrast present within the stomach and dilated loops of small bowel. No definite contrast is present within the  colon. Electronically Signed   By: Jasmine Pang M.D.   On: 05/10/2023 21:21   DG CHEST PORT 1 VIEW  Result Date: 05/10/2023 CLINICAL DATA:  [id number] Aspiration into airway, initial encounter [id number] EXAM: PORTABLE CHEST 1 VIEW COMPARISON:  11/17/2022 FINDINGS: Heart and mediastinal contours are within normal limits. No focal opacities or effusions. No acute bony abnormality. IMPRESSION: No active disease. Electronically Signed   By: Charlett Nose M.D.   On: 05/10/2023 19:10   CT ABDOMEN PELVIS W CONTRAST  Result Date:  05/10/2023 CLINICAL DATA:  67 year old female with history of acute onset of nonlocalized abdominal pain. Prior history of small-bowel obstruction. EXAM: CT ABDOMEN AND PELVIS WITH CONTRAST TECHNIQUE: Multidetector CT imaging of the abdomen and pelvis was performed using the standard protocol following bolus administration of intravenous contrast. RADIATION DOSE REDUCTION: This exam was performed according to the departmental dose-optimization program which includes automated exposure control, adjustment of the mA and/or kV according to patient size and/or use of iterative reconstruction technique. CONTRAST:  75mL OMNIPAQUE IOHEXOL 300 MG/ML  SOLN COMPARISON:  CT of the abdomen and pelvis 06/16/2022. FINDINGS: Lower chest: Bilateral breast implants with dense capsular calcifications incidentally noted. Hepatobiliary: No suspicious cystic or solid hepatic lesions. No intra or extrahepatic biliary ductal dilatation. Gallbladder is unremarkable in appearance. Pancreas: No pancreatic mass. No pancreatic ductal dilatation. No pancreatic or peripancreatic fluid collections or inflammatory changes. Spleen: Unremarkable. Adrenals/Urinary Tract: 2 mm nonobstructive calculus in the lower pole collecting system of the left kidney. Bilateral kidneys and adrenal glands are otherwise normal in appearance. No hydroureteronephrosis. Urinary bladder is nearly completely decompressed, but otherwise unremarkable in appearance. Stomach/Bowel: The appearance of the stomach is normal. Suture line in the right lower quadrant of the abdomen, likely from partial small bowel resection. In the left lower quadrant (axial image 53 of series 2) there is a dilated loop of small bowel measuring up to 4 cm in diameter with internal air-fluid levels. No generalized small bowel dilatation is otherwise noted. Colon appears relatively decompressed, although there is some gas and stool throughout the sigmoid colon. Appendix is not confidently identified.  Vascular/Lymphatic: Atherosclerotic calcifications in the abdominal aorta. No aneurysm or dissection noted in the abdominal or pelvic vasculature. No lymphadenopathy noted in the abdomen or pelvis. Reproductive: Status post hysterectomy. Ovaries are not confidently identified may be surgically absent or atrophic. Other: No significant volume of ascites.  No pneumoperitoneum. Musculoskeletal: There are no aggressive appearing lytic or blastic lesions noted in the visualized portions of the skeleton. Postoperative changes of PLIF at L4-L5 with interbody cage at the L4-L5 interspace and 8 mm of anterolisthesis of L4 upon L5 noted. IMPRESSION: 1. Dilated small bowel loop in the left lower quadrant of the abdomen, with air-fluid level in this region. No proximal dilatation of small bowel or stomach otherwise noted. However, the possibility of earlier partial small bowel obstruction should be considered, potentially from adhesions. Surgical consultation is recommended. 2. 2 mm nonobstructive calculus in the lower pole collecting system of the left kidney. 3. Additional postoperative changes and incidental findings, as above. Electronically Signed   By: Trudie Reed M.D.   On: 05/10/2023 06:46    Anti-infectives: Anti-infectives (From admission, onward)    None      Assessment/Plan  SBO, recurrent, history of multple abdominal surgeries   - CT w/ possible SBO with some dilated small bowel loops in the LLQ. Patient with history of multiple SBO's in the past.  She required ex lap, LOA, SBR  for chronically stenosed distal ileum secondary to longstanding adhesions by Dr. Luisa Hart in 2019.  She also had SBO's in 2020 and 2023  that resolved with conservative management. - SBO protocol with PO contrast yesterday >> developed hypoxia and was obtunded overnight. Imrpoved with narcan. She vomited and NG tube was placed, I think she has some contrast in her colon but clinically she remains obstructed with no bowel  function. Continue NG to LIWS and await bowel function.  - Keep K > 4, Mg > 2 and mobilize as able for bowel function - Hopefully patient will improve with conservative management. If patient fails to improve with conservative management, they may require exploratory surgery during admission   FEN - NPO, NGT to LIWS, IVF VTE - SCDs, okay for chem ppx from our standpoint ID - None     LOS: 1 day   I reviewed nursing notes, hospitalist notes, last 24 h vitals and pain scores, last 48 h intake and output, last 24 h labs and trends, and last 24 h imaging results.  This care required straight-forward level of medical decision making.   Hosie Spangle, PA-C [facility] Please see Amion for pager number during day hours 7:00am-4:30pm

## 2023-05-11 NOTE — Progress Notes (Signed)
PROGRESS NOTE    Bethany [name]  ZOX:[id number] DOB: [birth year] DOA: 05/10/2023 PCP: Joselyn Arrow, MD   Brief Narrative:  67 year old with history of HLD, CPPD, alcohol/opiate abuse and remission admitted for abdominal pain.  Patient was found to have small bowel obstruction, general surgery was consulted.   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  General surgery following, hopeful will be able to treat this with conservative measures. Monitor and replete electrolytes  Acute metabolic encephalopathy -Suspect overdose from multiple doses of narcotics.  Now improved, closely monitor.   HLD Hold p.o. medications while on low wall suction   Mood d/o P.o. meds currently on hold   Anterior basement membrane dystrophy of eyes -Continue Lotilaner, prednisolone   CODE STATUS CODE STATUS DNR confirmed by admitting provider      DVT prophylaxis: SCDs Start: 05/10/23 1058 Code Status: DNR Family Communication: Husband at bedside Status is: Inpatient Still having abdominal discomfort.  Not passing gas yet       Diet Orders (From admission, onward)     Start     Ordered   05/10/23 0537  Diet NPO time specified  Diet effective now        05/10/23 0536            Subjective:  NG tube in place.  Reporting of some sore throat.  Denies any other complaints at this time.  She is not passing gas yet  Examination:  General exam: Appears calm and comfortable, NG tube in place Respiratory system: Clear to auscultation. Respiratory effort normal. Cardiovascular system: S1 & S2 heard, RRR. No JVD, murmurs, rubs, gallops or clicks. No pedal edema. Gastrointestinal system: Abdomen is nondistended, soft and  nontender. No organomegaly or masses felt. Normal bowel sounds heard. Central nervous system: Alert and oriented. No focal neurological deficits. Extremities: Symmetric 5 x 5 power. Skin: No rashes, lesions or ulcers Psychiatry: Judgement and insight appear normal. Mood & affect appropriate.  Objective: Vitals:   05/10/23 2244 05/11/23 0000 05/11/23 0036 05/11/23 0500  BP:  95/80  (!) 153/74  Pulse: 82 88 83 85  Resp:      Temp:  (!) 97.4 F (36.3 C)  98.1 F (36.7 C)  TempSrc:  Oral  Oral  SpO2: 99% (!) 86% 96% 93%  Weight:      Height:        Intake/Output Summary (Last 24 hours) at 05/11/2023 0803 Last data filed at 05/11/2023 0600 Gross per 24 hour  Intake 569.2 ml  Output 450 ml  Net 119.2 ml   Filed Weights   05/10/23 0535  Weight: 52.2 kg    Scheduled Meds:  gabapentin  1,200 mg Oral QHS   gabapentin  600 mg Oral Daily   lamoTRIgine  100 mg Oral BID   Lotilaner  1 drop Both Eyes BID   prednisoLONE acetate  1 drop Both Eyes QID   simvastatin  20 mg Oral q1800   traZODone  50 mg Oral QHS   Continuous Infusions:  lactated ringers 75 mL/hr at 05/10/23 2214    Nutritional status     Body mass index is 19.74 kg/m.  Data Reviewed:   CBC:  Recent Labs  Lab 05/10/23 0537 05/11/23 0257  WBC 5.7 14.8*  NEUTROABS 2.7  --   HGB 13.7 12.7  HCT 41.2 38.8  MCV 102.0* 102.4*  PLT 192 181   Basic Metabolic Panel: Recent Labs  Lab 05/10/23 0537 05/11/23 0257  NA 140 143  K 3.9 4.5  CL 101 105  CO2 30 29  GLUCOSE 123* 118*  BUN 16 17  CREATININE 0.98 1.04*  CALCIUM 10.0 9.1   GFR: Estimated Creatinine Clearance: 43.3 mL/min (A) (by C-G formula based on SCr of 1.04 mg/dL (H)). Liver Function Tests: Recent Labs  Lab 05/10/23 0537  AST 31  ALT 24  ALKPHOS 59  BILITOT 0.4  PROT 7.0  ALBUMIN 4.7   Recent Labs  Lab 05/10/23 0537  LIPASE 27   No results for input(s): "AMMONIA" in the last 168 hours. Coagulation Profile: No results for  input(s): "INR", "PROTIME" in the last 168 hours. Cardiac Enzymes: No results for input(s): "CKTOTAL", "CKMB", "CKMBINDEX", "TROPONINI" in the last 168 hours. BNP (last 3 results) No results for input(s): "PROBNP" in the last 8760 hours. HbA1C: No results for input(s): "HGBA1C" in the last 72 hours. CBG: Recent Labs  Lab 05/10/23 1752  GLUCAP 254*   Lipid Profile: No results for input(s): "CHOL", "HDL", "LDLCALC", "TRIG", "CHOLHDL", "LDLDIRECT" in the last 72 hours. Thyroid Function Tests: No results for input(s): "TSH", "T4TOTAL", "FREET4", "T3FREE", "THYROIDAB" in the last 72 hours. Anemia Panel: No results for input(s): "VITAMINB12", "FOLATE", "FERRITIN", "TIBC", "IRON", "RETICCTPCT" in the last 72 hours. Sepsis Labs: No results for input(s): "PROCALCITON", "LATICACIDVEN" in the last 168 hours.  No results found for this or any previous visit (from the past 240 hour(s)).       Radiology Studies: DG Abd Portable 1V-Small Bowel Obstruction Protocol-initial, 8 hr delay  Result Date: 05/10/2023 CLINICAL DATA:  Small bowel obstruction, 8 hour delay EXAM: PORTABLE ABDOMEN - 1 VIEW COMPARISON:  CT 05/10/2023 FINDINGS: Contrast present within the stomach and dilated loops of small bowel. No definite contrast is present within the colon. Hardware in the lower spine. IMPRESSION: Contrast present within the stomach and dilated loops of small bowel. No definite contrast is present within the colon. Electronically Signed   By: Jasmine Pang M.D.   On: 05/10/2023 21:21   DG CHEST PORT 1 VIEW  Result Date: 05/10/2023 CLINICAL DATA:  [id number] Aspiration into airway, initial encounter [id number] EXAM: PORTABLE CHEST 1 VIEW COMPARISON:  11/17/2022 FINDINGS: Heart and mediastinal contours are within normal limits. No focal opacities or effusions. No acute bony abnormality. IMPRESSION: No active disease. Electronically Signed   By: Charlett Nose M.D.   On: 05/10/2023 19:10   CT ABDOMEN PELVIS W  CONTRAST  Result Date: 05/10/2023 CLINICAL DATA:  67 year old female with history of acute onset of nonlocalized abdominal pain. Prior history of small-bowel obstruction. EXAM: CT ABDOMEN AND PELVIS WITH CONTRAST TECHNIQUE: Multidetector CT imaging of the abdomen and pelvis was performed using the standard protocol following bolus administration of intravenous contrast. RADIATION DOSE REDUCTION: This exam was performed according to the departmental dose-optimization program which includes automated exposure control, adjustment of the mA and/or kV according to patient size and/or use of iterative reconstruction technique. CONTRAST:  75mL OMNIPAQUE IOHEXOL 300 MG/ML  SOLN COMPARISON:  CT of the abdomen and pelvis 06/16/2022. FINDINGS: Lower chest: Bilateral breast implants with dense capsular calcifications incidentally noted. Hepatobiliary: No suspicious cystic or solid hepatic lesions. No intra or extrahepatic biliary ductal dilatation. Gallbladder is unremarkable in  appearance. Pancreas: No pancreatic mass. No pancreatic ductal dilatation. No pancreatic or peripancreatic fluid collections or inflammatory changes. Spleen: Unremarkable. Adrenals/Urinary Tract: 2 mm nonobstructive calculus in the lower pole collecting system of the left kidney. Bilateral kidneys and adrenal glands are otherwise normal in appearance. No hydroureteronephrosis. Urinary bladder is nearly completely decompressed, but otherwise unremarkable in appearance. Stomach/Bowel: The appearance of the stomach is normal. Suture line in the right lower quadrant of the abdomen, likely from partial small bowel resection. In the left lower quadrant (axial image 53 of series 2) there is a dilated loop of small bowel measuring up to 4 cm in diameter with internal air-fluid levels. No generalized small bowel dilatation is otherwise noted. Colon appears relatively decompressed, although there is some gas and stool throughout the sigmoid colon. Appendix is  not confidently identified. Vascular/Lymphatic: Atherosclerotic calcifications in the abdominal aorta. No aneurysm or dissection noted in the abdominal or pelvic vasculature. No lymphadenopathy noted in the abdomen or pelvis. Reproductive: Status post hysterectomy. Ovaries are not confidently identified may be surgically absent or atrophic. Other: No significant volume of ascites.  No pneumoperitoneum. Musculoskeletal: There are no aggressive appearing lytic or blastic lesions noted in the visualized portions of the skeleton. Postoperative changes of PLIF at L4-L5 with interbody cage at the L4-L5 interspace and 8 mm of anterolisthesis of L4 upon L5 noted. IMPRESSION: 1. Dilated small bowel loop in the left lower quadrant of the abdomen, with air-fluid level in this region. No proximal dilatation of small bowel or stomach otherwise noted. However, the possibility of earlier partial small bowel obstruction should be considered, potentially from adhesions. Surgical consultation is recommended. 2. 2 mm nonobstructive calculus in the lower pole collecting system of the left kidney. 3. Additional postoperative changes and incidental findings, as above. Electronically Signed   By: Trudie Reed M.D.   On: 05/10/2023 06:46           LOS: 1 day   Time spent= 35 mins    Amorita Vanrossum Joline Maxcy, MD [facility]  If 7PM-7AM, please contact night-coverage  05/11/2023, 8:03 AM

## 2023-05-11 NOTE — Progress Notes (Signed)
   05/11/23 1118  TOC Brief Assessment  Insurance and Status Reviewed  Patient has primary care physician Yes  Home environment has been reviewed from home w spouse  Prior level of function: independent  Prior/Current Home Services No current home services  Social Determinants of Health Reivew SDOH reviewed no interventions necessary  Readmission risk has been reviewed Yes  Transition of care needs no transition of care needs at this time

## 2023-05-12 ENCOUNTER — Inpatient Hospital Stay (HOSPITAL_COMMUNITY): Payer: Medicare Other

## 2023-05-12 DIAGNOSIS — K56609 Unspecified intestinal obstruction, unspecified as to partial versus complete obstruction: Secondary | ICD-10-CM | POA: Diagnosis not present

## 2023-05-12 LAB — CBC
HCT: 37.8 % (ref 36.0–46.0)
Hemoglobin: 12.2 g/dL (ref 12.0–15.0)
MCH: 33.2 pg (ref 26.0–34.0)
MCHC: 32.3 g/dL (ref 30.0–36.0)
MCV: 103 fL — ABNORMAL HIGH (ref 80.0–100.0)
Platelets: 153 10*3/uL (ref 150–400)
RBC: 3.67 MIL/uL — ABNORMAL LOW (ref 3.87–5.11)
RDW: 12.8 % (ref 11.5–15.5)
WBC: 11.8 10*3/uL — ABNORMAL HIGH (ref 4.0–10.5)
nRBC: 0 % (ref 0.0–0.2)

## 2023-05-12 LAB — BASIC METABOLIC PANEL
Anion gap: 7 (ref 5–15)
BUN: 16 mg/dL (ref 8–23)
CO2: 31 mmol/L (ref 22–32)
Calcium: 9 mg/dL (ref 8.9–10.3)
Chloride: 101 mmol/L (ref 98–111)
Creatinine, Ser: 0.81 mg/dL (ref 0.44–1.00)
GFR, Estimated: >60 mL/min (ref >60)
Glucose, Bld: 129 mg/dL — ABNORMAL HIGH (ref 70–99)
Potassium: 4.3 mmol/L (ref 3.5–5.1)
Sodium: 139 mmol/L (ref 135–145)

## 2023-05-12 LAB — PHOSPHORUS: Phosphorus: 2.1 mg/dL — ABNORMAL LOW (ref 2.5–4.6)

## 2023-05-12 LAB — MAGNESIUM: Magnesium: 2.1 mg/dL (ref 1.7–2.4)

## 2023-05-12 MED ORDER — LORAZEPAM 2 MG/ML IJ SOLN
0.5000 mg | Freq: Four times a day (QID) | INTRAMUSCULAR | Status: DC | PRN
  Administered 2023-05-12 – 2023-05-15 (×2): 0.5 mg via INTRAVENOUS
  Filled 2023-05-12 (×2): qty 1

## 2023-05-12 MED ORDER — SODIUM PHOSPHATES 45 MMOLE/15ML IV SOLN
30.0000 mmol | Freq: Once | INTRAVENOUS | Status: AC
  Administered 2023-05-12: 30 mmol via INTRAVENOUS
  Filled 2023-05-12: qty 10

## 2023-05-12 NOTE — Plan of Care (Signed)

## 2023-05-12 NOTE — Progress Notes (Signed)
[facility] Progress Note     Subjective: CC:  Reports abdominal pain and feeling irritable. Reports some flatus. Denies BM. NG 650 mL  Objective: Vital signs in last 24 hours: Temp:  [97.8 F (36.6 C)-98.2 F (36.8 C)] 97.9 F (36.6 C) (07/10 0800) Pulse Rate:  [67-78] 78 (07/10 0800) Resp:  [17-18] 17 (07/10 0800) BP: (116-167)/(72-82) 154/82 (07/10 0800) SpO2:  [78 %-100 %] 99 % (07/10 0800) Last BM Date : 05/10/23  Intake/Output from previous day: 07/09 0701 - 07/10 0700 In: 1531 [I.V.:1531] Out: 650 [Emesis/NG output:650] Intake/Output this shift: No intake/output data recorded.  PE: Gen:  somnolent, arouses to voice. Card:  Regular rate and rhythm Pulm:  Normal effort ORA Abd: Soft, non-tender, non-distended, previous laparotomy scar appears well-healing Skin: warm and dry, no rashes  Psych: A&Ox3   Lab Results:  Recent Labs    05/11/23 0257 05/12/23 0218  WBC 14.8* 11.8*  HGB 12.7 12.2  HCT 38.8 37.8  PLT 181 153   BMET Recent Labs    05/11/23 0257 05/12/23 0218  NA 143 139  K 4.5 4.3  CL 105 101  CO2 29 31  GLUCOSE 118* 129*  BUN 17 16  CREATININE 1.04* 0.81  CALCIUM 9.1 9.0   PT/INR No results for input(s): "LABPROT", "INR" in the last 72 hours. CMP     Component Value Date/Time   NA 139 05/12/2023 0218   NA 140 04/29/2023 0810   K 4.3 05/12/2023 0218   CL 101 05/12/2023 0218   CO2 31 05/12/2023 0218   GLUCOSE 129 (H) 05/12/2023 0218   BUN 16 05/12/2023 0218   BUN 17 04/29/2023 0810   CREATININE 0.81 05/12/2023 0218   CREATININE 1.05 (H) 03/26/2017 0929   CALCIUM 9.0 05/12/2023 0218   PROT 7.0 05/10/2023 0537   PROT 6.9 04/29/2023 0810   ALBUMIN 4.7 05/10/2023 0537   ALBUMIN 4.8 04/29/2023 0810   AST 31 05/10/2023 0537   ALT 24 05/10/2023 0537   ALKPHOS 59 05/10/2023 0537   BILITOT 0.4 05/10/2023 0537   BILITOT 0.3 04/29/2023 0810   GFRNONAA >60 05/12/2023 0218   GFRAA 83 10/18/2020 1227   Lipase      Component Value Date/Time   LIPASE 27 05/10/2023 0537       Studies/Results: DG Abd Portable 1V  Result Date: 05/11/2023 CLINICAL DATA:  Small bowel obstruction EXAM: PORTABLE ABDOMEN - 1 VIEW COMPARISON:  Earlier today FINDINGS: An enteric tube tip and side-port overlaps the stomach. Dilated small bowel loop in the pelvis. Adjacent contrast is presumably within a neighboring small bowel. No colonic contrast is noted. IMPRESSION: Persisting small bowel obstruction with dilated loops in the pelvis. Adjacent contrast is likely within a small bowel loop, no generalized colonic contrast. Electronically Signed   By: Tiburcio Pea M.D.   On: 05/11/2023 16:31   DG Abd Portable 1V  Result Date: 05/11/2023 CLINICAL DATA:  [id number] Encounter for nasogastric (NG) tube placement [id number] EXAM: PORTABLE ABDOMEN - 1 VIEW COMPARISON:  Radiograph from yesterday. FINDINGS: Interval placement of enteric tube with its tip and side hole overlying the left upper quadrant, within the proximal stomach. The bowel gas pattern is non-obstructive. No evidence of pneumoperitoneum. No acute osseous abnormalities. The soft tissues are within normal limits. Bilateral breast implants noted. Partially seen lumbar spinal fixation hardware. There are multiple monitoring electrodes overlying the chest and upper abdomen. IMPRESSION: *Enteric tube tip and side hole overlying the proximal stomach. Electronically Signed   By:  Jules Schick M.D.   On: 05/11/2023 08:33   DG Abd Portable 1V-Small Bowel Obstruction Protocol-initial, 8 hr delay  Result Date: 05/10/2023 CLINICAL DATA:  Small bowel obstruction, 8 hour delay EXAM: PORTABLE ABDOMEN - 1 VIEW COMPARISON:  CT 05/10/2023 FINDINGS: Contrast present within the stomach and dilated loops of small bowel. No definite contrast is present within the colon. Hardware in the lower spine. IMPRESSION: Contrast present within the stomach and dilated loops of small bowel. No definite contrast is  present within the colon. Electronically Signed   By: Jasmine Pang M.D.   On: 05/10/2023 21:21   DG CHEST PORT 1 VIEW  Result Date: 05/10/2023 CLINICAL DATA:  [id number] Aspiration into airway, initial encounter [id number] EXAM: PORTABLE CHEST 1 VIEW COMPARISON:  11/17/2022 FINDINGS: Heart and mediastinal contours are within normal limits. No focal opacities or effusions. No acute bony abnormality. IMPRESSION: No active disease. Electronically Signed   By: Charlett Nose M.D.   On: 05/10/2023 19:10    Anti-infectives: Anti-infectives (From admission, onward)    None      Assessment/Plan  SBO, recurrent, history of multple abdominal surgeries   - CT w/ possible SBO with some dilated small bowel loops in the LLQ. Patient with history of multiple SBO's in the past.  She required ex lap, LOA, SBR for chronically stenosed distal ileum secondary to longstanding adhesions by Dr. Luisa Hart in 2019.  She also had SBO's in 2020 and 2023  that resolved with conservative management. - SBO protocol with PO contrast 7/8 >> developed hypoxia and was obtunded overnight. Imrpoved with narcan. She vomited and NG tube was placed - KUB this morning pending. She is having some flatus but minimal bowel function. Failure to improve in the next 24 hours may warrant surgical exploration tomorrow. - Keep K > 4, Mg > 2 and mobilize as able for bowel function  FEN - NPO, NGT to LIWS, IVF VTE - SCDs, okay for chem ppx from our standpoint ID - None     LOS: 2 days   I reviewed nursing notes, hospitalist notes, last 24 h vitals and pain scores, last 48 h intake and output, last 24 h labs and trends, and last 24 h imaging results.  This care required straight-forward level of medical decision making.   Hosie Spangle, PA-C [facility] Please see Amion for pager number during day hours 7:00am-4:30pm

## 2023-05-12 NOTE — Progress Notes (Signed)
PROGRESS NOTE    Bethany [name]  UEA:[id number] DOB: 1956-01-02 DOA: 05/10/2023 PCP: Joselyn Arrow, MD   Brief Narrative:  67 year old with history of HLD, CPPD, alcohol/opiate abuse and remission admitted for abdominal pain.  Patient was found to have small bowel obstruction, general surgery was consulted.   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  Still has persistent small bowel obstruction.  Will defer the need for laparotomy to general surgery Monitor and replete electrolytes  Acute metabolic encephalopathy, resolved -Suspect overdose from multiple doses of narcotics.  Now improved, closely monitor.   HLD Hold p.o. medications while on low wall suction   Mood d/o P.o. meds currently on hold   Anterior basement membrane dystrophy of eyes -Continue Lotilaner, prednisolone   CODE STATUS CODE STATUS DNR confirmed by admitting provider      DVT prophylaxis: SCDs Start: 05/10/23 1058 Code Status: DNR Family Communication: Spouse at bedside Status is: Inpatient       Diet Orders (From admission, onward)     Start     Ordered   05/11/23 0804  Diet NPO time specified Except for: Other (See Comments)  Diet effective now       Comments: NO MEDICATIONS included in NPO status.  Question:  Except for  Answer:  Other (See Comments)   05/11/23 0804            Subjective: Doing better, tells me passing gas.   Examination:  General exam: Appears calm and comfortable, NG tube in place Respiratory system: Clear to auscultation. Respiratory effort normal. Cardiovascular system: S1 & S2 heard, RRR. No JVD, murmurs, rubs, gallops or clicks. No pedal edema. Gastrointestinal system: diminished BS Central  nervous system: Alert and oriented. No focal neurological deficits. Extremities: Symmetric 5 x 5 power. Skin: No rashes, lesions or ulcers Psychiatry: Judgement and insight appear normal. Mood & affect appropriate.  Objective: Vitals:   05/11/23 1716 05/11/23 2048 05/11/23 2347 05/12/23 0326  BP: (!) 150/81 (!) 144/76 (!) 167/74 (!) 155/74  Pulse: 76 74 69 67  Resp:      Temp: 98.2 F (36.8 C) 97.8 F (36.6 C) 97.9 F (36.6 C) 97.8 F (36.6 C)  TempSrc: Oral Oral Oral Oral  SpO2: 100% 100% 96% 97%  Weight:      Height:        Intake/Output Summary (Last 24 hours) at 05/12/2023 0806 Last data filed at 05/12/2023 0600 Gross per 24 hour  Intake 1531.03 ml  Output 650 ml  Net 881.03 ml   Filed Weights   05/10/23 0535  Weight: 52.2 kg    Scheduled Meds:  gabapentin  1,200 mg Oral QHS   gabapentin  600 mg Oral Daily   lamoTRIgine  100 mg Oral BID   Lotilaner  1 drop Both Eyes BID   prednisoLONE acetate  1 drop Both Eyes QID   simvastatin  20 mg Oral q1800   traZODone  50 mg Oral QHS   Continuous Infusions:  dextrose 5 % and 0.45 % NaCl 75 mL/hr at 05/11/23 2152   sodium phosphate 30 mmol in dextrose 5 % 250 mL infusion      Nutritional status  Body mass index is 19.74 kg/m.  Data Reviewed:   CBC: Recent Labs  Lab 05/10/23 0537 05/11/23 0257 05/12/23 0218  WBC 5.7 14.8* 11.8*  NEUTROABS 2.7  --   --   HGB 13.7 12.7 12.2  HCT 41.2 38.8 37.8  MCV 102.0* 102.4* 103.0*  PLT 192 181 153   Basic Metabolic Panel: Recent Labs  Lab 05/10/23 0537 05/11/23 0257 05/12/23 0218  NA 140 143 139  K 3.9 4.5 4.3  CL 101 105 101  CO2 30 29 31   GLUCOSE 123* 118* 129*  BUN 16 17 16   CREATININE 0.98 1.04* 0.81  CALCIUM 10.0 9.1 9.0  MG  --   --  2.1  PHOS  --   --  2.1*   GFR: Estimated Creatinine Clearance: 55.5 mL/min (by C-G formula based on SCr of 0.81 mg/dL). Liver Function Tests: Recent Labs  Lab 05/10/23 0537  AST 31  ALT 24  ALKPHOS 59   BILITOT 0.4  PROT 7.0  ALBUMIN 4.7   Recent Labs  Lab 05/10/23 0537  LIPASE 27   No results for input(s): "AMMONIA" in the last 168 hours. Coagulation Profile: No results for input(s): "INR", "PROTIME" in the last 168 hours. Cardiac Enzymes: No results for input(s): "CKTOTAL", "CKMB", "CKMBINDEX", "TROPONINI" in the last 168 hours. BNP (last 3 results) No results for input(s): "PROBNP" in the last 8760 hours. HbA1C: No results for input(s): "HGBA1C" in the last 72 hours. CBG: Recent Labs  Lab 05/10/23 1752  GLUCAP 254*   Lipid Profile: No results for input(s): "CHOL", "HDL", "LDLCALC", "TRIG", "CHOLHDL", "LDLDIRECT" in the last 72 hours. Thyroid Function Tests: No results for input(s): "TSH", "T4TOTAL", "FREET4", "T3FREE", "THYROIDAB" in the last 72 hours. Anemia Panel: No results for input(s): "VITAMINB12", "FOLATE", "FERRITIN", "TIBC", "IRON", "RETICCTPCT" in the last 72 hours. Sepsis Labs: No results for input(s): "PROCALCITON", "LATICACIDVEN" in the last 168 hours.  No results found for this or any previous visit (from the past 240 hour(s)).       Radiology Studies: DG Abd Portable 1V  Result Date: 05/11/2023 CLINICAL DATA:  Small bowel obstruction EXAM: PORTABLE ABDOMEN - 1 VIEW COMPARISON:  Earlier today FINDINGS: An enteric tube tip and side-port overlaps the stomach. Dilated small bowel loop in the pelvis. Adjacent contrast is presumably within a neighboring small bowel. No colonic contrast is noted. IMPRESSION: Persisting small bowel obstruction with dilated loops in the pelvis. Adjacent contrast is likely within a small bowel loop, no generalized colonic contrast. Electronically Signed   By: Tiburcio Pea M.D.   On: 05/11/2023 16:31   DG Abd Portable 1V  Result Date: 05/11/2023 CLINICAL DATA:  [id number] Encounter for nasogastric (NG) tube placement [id number] EXAM: PORTABLE ABDOMEN - 1 VIEW COMPARISON:  Radiograph from yesterday. FINDINGS: Interval placement of  enteric tube with its tip and side hole overlying the left upper quadrant, within the proximal stomach. The bowel gas pattern is non-obstructive. No evidence of pneumoperitoneum. No acute osseous abnormalities. The soft tissues are within normal limits. Bilateral breast implants noted. Partially seen lumbar spinal fixation hardware. There are multiple monitoring electrodes overlying the chest and upper abdomen. IMPRESSION: *Enteric tube tip and side hole overlying the proximal stomach. Electronically Signed   By: Jules Schick M.D.   On: 05/11/2023 08:33   DG Abd Portable 1V-Small Bowel Obstruction Protocol-initial, 8 hr delay  Result Date: 05/10/2023 CLINICAL DATA:  Small bowel obstruction, 8 hour delay EXAM: PORTABLE ABDOMEN - 1 VIEW COMPARISON:  CT 05/10/2023 FINDINGS: Contrast  present within the stomach and dilated loops of small bowel. No definite contrast is present within the colon. Hardware in the lower spine. IMPRESSION: Contrast present within the stomach and dilated loops of small bowel. No definite contrast is present within the colon. Electronically Signed   By: Jasmine Pang M.D.   On: 05/10/2023 21:21   DG CHEST PORT 1 VIEW  Result Date: 05/10/2023 CLINICAL DATA:  [id number] Aspiration into airway, initial encounter [id number] EXAM: PORTABLE CHEST 1 VIEW COMPARISON:  11/17/2022 FINDINGS: Heart and mediastinal contours are within normal limits. No focal opacities or effusions. No acute bony abnormality. IMPRESSION: No active disease. Electronically Signed   By: Charlett Nose M.D.   On: 05/10/2023 19:10           LOS: 2 days   Time spent= 35 mins    Antonela Freiman Joline Maxcy, MD [facility]  If 7PM-7AM, please contact night-coverage  05/12/2023, 8:06 AM

## 2023-05-13 ENCOUNTER — Ambulatory Visit: Payer: Medicare Other

## 2023-05-13 ENCOUNTER — Encounter: Payer: Federal, State, Local not specified - PPO | Admitting: Family Medicine

## 2023-05-13 ENCOUNTER — Inpatient Hospital Stay (HOSPITAL_COMMUNITY): Payer: Medicare Other

## 2023-05-13 DIAGNOSIS — K56609 Unspecified intestinal obstruction, unspecified as to partial versus complete obstruction: Secondary | ICD-10-CM | POA: Diagnosis not present

## 2023-05-13 DIAGNOSIS — Z4682 Encounter for fitting and adjustment of non-vascular catheter: Secondary | ICD-10-CM | POA: Diagnosis not present

## 2023-05-13 LAB — CBC
HCT: 39.4 % (ref 36.0–46.0)
Hemoglobin: 13.1 g/dL (ref 12.0–15.0)
MCH: 34 pg (ref 26.0–34.0)
MCHC: 33.2 g/dL (ref 30.0–36.0)
MCV: 102.3 fL — ABNORMAL HIGH (ref 80.0–100.0)
Platelets: 158 10*3/uL (ref 150–400)
RBC: 3.85 MIL/uL — ABNORMAL LOW (ref 3.87–5.11)
RDW: 12.4 % (ref 11.5–15.5)
WBC: 13.5 10*3/uL — ABNORMAL HIGH (ref 4.0–10.5)
nRBC: 0 % (ref 0.0–0.2)

## 2023-05-13 LAB — BASIC METABOLIC PANEL
Anion gap: 11 (ref 5–15)
BUN: 19 mg/dL (ref 8–23)
CO2: 30 mmol/L (ref 22–32)
Calcium: 9.3 mg/dL (ref 8.9–10.3)
Chloride: 102 mmol/L (ref 98–111)
Creatinine, Ser: 0.75 mg/dL (ref 0.44–1.00)
GFR, Estimated: >60 mL/min (ref >60)
Glucose, Bld: 98 mg/dL (ref 70–99)
Potassium: 3.2 mmol/L — ABNORMAL LOW (ref 3.5–5.1)
Sodium: 143 mmol/L (ref 135–145)

## 2023-05-13 LAB — MAGNESIUM: Magnesium: 2.1 mg/dL (ref 1.7–2.4)

## 2023-05-13 LAB — TYPE AND SCREEN
ABO/RH(D): O POS
Antibody Screen: NEGATIVE

## 2023-05-13 MED ORDER — METRONIDAZOLE 500 MG/100ML IV SOLN: 500.0000 mg | INTRAVENOUS | Status: AC

## 2023-05-13 MED ORDER — POTASSIUM CHLORIDE 10 MEQ/100ML IV SOLN
10.0000 meq | INTRAVENOUS | Status: AC
  Administered 2023-05-13 (×4): 10 meq via INTRAVENOUS
  Filled 2023-05-13 (×4): qty 100

## 2023-05-13 MED ORDER — SODIUM CHLORIDE 0.9 % IV SOLN
2.0000 g | INTRAVENOUS | Status: AC
  Filled 2023-05-13: qty 20

## 2023-05-13 MED ORDER — DEXTROSE-SODIUM CHLORIDE 5-0.45 % IV SOLN: INTRAVENOUS | Status: AC

## 2023-05-13 NOTE — Plan of Care (Signed)

## 2023-05-13 NOTE — Care Management Important Message (Signed)
Important Message  Patient Details  Name: HINATA DIENER MRN: [id number] Date of Birth: 09-03-1956   Medicare Important Message Given:  Yes     Sherilyn Banker 05/13/2023, 12:25 PM

## 2023-05-13 NOTE — Evaluation (Signed)
Pt has rested quietly throughout the night with no distress noted. Medicated twice for pain with relief noted. New NG holder placed to nose as the old one falling off. Pt keeps asking if she is having surgery in the am. Up to [facility].

## 2023-05-13 NOTE — Progress Notes (Signed)
[facility] Progress Note     Subjective: CC:  Abd pain improves. 800 mL NG output overnight, 1200 mL/24h. Reports minimal flatus. Denies BM.   Objective: Vital signs in last 24 hours: Temp:  [97.6 F (36.4 C)-98.4 F (36.9 C)] 97.6 F (36.4 C) (07/11 0328) Pulse Rate:  [65-72] 67 (07/11 0328) Resp:  [17-18] 18 (07/10 1600) BP: (146-161)/(70-79) 146/76 (07/11 0328) SpO2:  [95 %-98 %] 98 % (07/11 0328) Last BM Date : 05/10/23  Intake/Output from previous day: 07/10 0701 - 07/11 0700 In: 179.2 [IV Piggyback:179.2] Out: 1200 [Emesis/NG output:1200] Intake/Output this shift: No intake/output data recorded.  PE: Gen:  somnolent, arouses to voice. Card:  Regular rate and rhythm Pulm:  Normal effort ORA Abd: Soft, non-tender, non-distended, previous laparotomy scar appears well-healing Skin: warm and dry, no rashes  Psych: A&Ox3   Lab Results:  Recent Labs    05/12/23 0218 05/13/23 0724  WBC 11.8* 13.5*  HGB 12.2 13.1  HCT 37.8 39.4  PLT 153 158   BMET Recent Labs    05/12/23 0218 05/13/23 0724  NA 139 143  K 4.3 3.2*  CL 101 102  CO2 31 30  GLUCOSE 129* 98  BUN 16 19  CREATININE 0.81 0.75  CALCIUM 9.0 9.3   PT/INR No results for input(s): "LABPROT", "INR" in the last 72 hours. CMP     Component Value Date/Time   NA 143 05/13/2023 0724   NA 140 04/29/2023 0810   K 3.2 (L) 05/13/2023 0724   CL 102 05/13/2023 0724   CO2 30 05/13/2023 0724   GLUCOSE 98 05/13/2023 0724   BUN 19 05/13/2023 0724   BUN 17 04/29/2023 0810   CREATININE 0.75 05/13/2023 0724   CREATININE 1.05 (H) 03/26/2017 0929   CALCIUM 9.3 05/13/2023 0724   PROT 7.0 05/10/2023 0537   PROT 6.9 04/29/2023 0810   ALBUMIN 4.7 05/10/2023 0537   ALBUMIN 4.8 04/29/2023 0810   AST 31 05/10/2023 0537   ALT 24 05/10/2023 0537   ALKPHOS 59 05/10/2023 0537   BILITOT 0.4 05/10/2023 0537   BILITOT 0.3 04/29/2023 0810   GFRNONAA >60 05/13/2023 0724   GFRAA 83 10/18/2020 1227    Lipase     Component Value Date/Time   LIPASE 27 05/10/2023 0537       Studies/Results: DG Abd Portable 1V  Result Date: 05/12/2023 CLINICAL DATA:  Abdominal pain, follow up small bowel obstruction EXAM: PORTABLE ABDOMEN - 1 VIEW COMPARISON:  05/11/2023 FINDINGS: Gastric catheter is again noted within the stomach. Scattered large and small bowel gas is noted. Previously seen small bowel dilatation has resolved. Contrast is noted within the colon. No free air is seen. Postsurgical changes are again noted. IMPRESSION: Previously seen small bowel dilatation has resolved. Electronically Signed   By: Alcide Clever M.D.   On: 05/12/2023 11:18   DG Abd Portable 1V  Result Date: 05/11/2023 CLINICAL DATA:  Small bowel obstruction EXAM: PORTABLE ABDOMEN - 1 VIEW COMPARISON:  Earlier today FINDINGS: An enteric tube tip and side-port overlaps the stomach. Dilated small bowel loop in the pelvis. Adjacent contrast is presumably within a neighboring small bowel. No colonic contrast is noted. IMPRESSION: Persisting small bowel obstruction with dilated loops in the pelvis. Adjacent contrast is likely within a small bowel loop, no generalized colonic contrast. Electronically Signed   By: Tiburcio Pea M.D.   On: 05/11/2023 16:31    Anti-infectives: Anti-infectives (From admission, onward)    Start     Dose/Rate  Route Frequency Ordered Stop   05/13/23 0915  cefTRIAXone (ROCEPHIN) 2 g in sodium chloride 0.9 % 100 mL IVPB       Note to Pharmacy: Pharmacy may adjust dosing strength / duration / interval for maximal efficacy   2 g 200 mL/hr over 30 Minutes Intravenous On call to O.R. 05/13/23 1610 05/14/23 0559   05/13/23 0915  metroNIDAZOLE (FLAGYL) IVPB 500 mg        500 mg 100 mL/hr over 60 Minutes Intravenous On call to O.R. 05/13/23 9604 05/14/23 0559      Assessment/Plan  SBO, recurrent, history of multple abdominal surgeries   - CT w/ possible SBO with some dilated small bowel loops in the LLQ.  Patient with history of multiple SBO's in the past.  She required ex lap, LOA, SBR for chronically stenosed distal ileum secondary to longstanding adhesions by Dr. Luisa Hart in 2019.  She also had SBO's in 2020 and 2023  that resolved with conservative management. - SBO protocol with PO contrast on admission, ended up needing NGT placement. KUB with some contrast in the colon but it appears to me there is still a dilated loop of small bowel in the pelvis. Clinically she remains with high grade partial obstruction. She has not improved with 3 days of bowel rest, PO contrast, and NG decompression. Recommend proceeding to the operating room today for exploratory laparotomy, possible bowel resection. Abx on call to OR.   FEN - NPO, NGT to LIWS, IVF VTE - SCDs, okay for chem ppx from our standpoint ID - None     LOS: 3 days   I reviewed nursing notes, hospitalist notes, last 24 h vitals and pain scores, last 48 h intake and output, last 24 h labs and trends, and last 24 h imaging results.  This care required straight-forward level of medical decision making.   Hosie Spangle, PA-C [facility] Please see Amion for pager number during day hours 7:00am-4:30pm

## 2023-05-13 NOTE — H&P (View-Only) (Signed)
Unable to proceed with surgery today due to emergency case.  Will plan to proceed with operative intervention tomorrow.  Will inform patient.  Agostino Gorin E Rosabel Sermeno 1:03 PM 05/13/2023  

## 2023-05-13 NOTE — Anesthesia Preprocedure Evaluation (Addendum)
Anesthesia Evaluation  Patient identified by MRN, date of birth, ID band Patient awake    Reviewed: Allergy & Precautions, NPO status , Patient's Chart, lab work & pertinent test results  Airway Mallampati: II  TM Distance: >3 FB Neck ROM: Full    Dental no notable dental hx. (+) Teeth Intact, Dental Advisory Given   Pulmonary shortness of breath, former smoker   Pulmonary exam normal breath sounds clear to auscultation       Cardiovascular negative cardio ROS Normal cardiovascular exam Rhythm:Regular Rate:Normal     Neuro/Psych  PSYCHIATRIC DISORDERS Anxiety Depression       GI/Hepatic negative GI ROS, Neg liver ROS,,,  Endo/Other  negative endocrine ROS    Renal/GU Lab Results      Component                Value               Date                           K                        3.2 (L)             05/13/2023                BUN                      19                  05/13/2023                CREATININE               0.75                05/13/2023                  PHOS                     2.1 (L)             05/12/2023                      GLUCOSE                  98                  05/13/2023                Musculoskeletal  (+) Arthritis , Osteoarthritis,    Abdominal   Peds  Hematology Lab Results      Component                Value               Date                      WBC                      13.5 (H)            05/13/2023                HGB  13.1                05/13/2023                HCT                      39.4                05/13/2023                MCV                      102.3 (H)           05/13/2023                PLT                      158                 05/13/2023              Anesthesia Other Findings All: buprenorphine  Reproductive/Obstetrics                             Anesthesia Physical Anesthesia Plan  ASA: 2  Anesthesia  Plan: General   Post-op Pain Management: Ketamine IV*   Induction: Intravenous  PONV Risk Score and Plan: 4 or greater and Midazolam, Treatment may vary due to age or medical condition, Ondansetron and Dexamethasone  Airway Management Planned: Oral ETT  Additional Equipment: None  Intra-op Plan:   Post-operative Plan: Extubation in OR  Informed Consent: I have reviewed the patients History and Physical, chart, labs and discussed the procedure including the risks, benefits and alternatives for the proposed anesthesia with the patient or authorized representative who has indicated his/her understanding and acceptance.     Dental advisory given  Plan Discussed with:   Anesthesia Plan Comments:         Anesthesia Quick Evaluation

## 2023-05-13 NOTE — Progress Notes (Signed)
Unable to proceed with surgery today due to emergency case.  Will plan to proceed with operative intervention tomorrow.  Will inform patient.  Letha Cape 1:03 PM 05/13/2023

## 2023-05-13 NOTE — Progress Notes (Signed)
PROGRESS NOTE    Bethany [name]  ZOX:[id number] DOB: November 12, 1955 DOA: 05/10/2023 PCP: Joselyn Arrow, MD   Brief Narrative:  67 year old with history of HLD, CPPD, alcohol/opiate abuse and remission admitted for abdominal pain.  Patient was found to have small bowel obstruction, general surgery was consulted.  NG tube placed due to nausea vomiting, slow to improve.  General surgery to decide if patient will need surgery or not.   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  Obstruction have slowly reviewed.  Abdominal x-ray ordered this morning.  General surgery following.  Will make determination today to see if patient will need surgery going forward. D5 1/2 NS today.   Hypokalemia Replete Potassium.   Acute metabolic encephalopathy, resolved -Suspect overdose from multiple doses of narcotics.  Now improved, closely monitor.   HLD Hold p.o. medications while on low wall suction   Mood d/o P.o. meds currently on hold   Anterior basement membrane dystrophy of eyes -Continue Lotilaner, prednisolone   CODE STATUS CODE STATUS DNR confirmed by admitting provider      DVT prophylaxis: SCDs Start: 05/10/23 1058 Code Status: DNR Family Communication: Spouse at bedside Status is: Inpatient Home once cleared by general surgery      Diet Orders (From admission, onward)     Start     Ordered   05/11/23 0804  Diet NPO time specified Except for: Other (See Comments)  Diet effective now       Comments: NO MEDICATIONS included in NPO status.  Question:  Except for  Answer:  Other (See Comments)   05/11/23 8119            Subjective: Still no BM Overall remains the same.   Examination: Constitutional: Not in  acute distress; NGT tube in place.  Respiratory: Clear to auscultation bilaterally Cardiovascular: Normal sinus rhythm, no rubs Abdomen: Nontender nondistended good bowel sounds Musculoskeletal: No edema noted Skin: No rashes seen Neurologic: CN 2-12 grossly intact.  And nonfocal Psychiatric: Normal judgment and insight. Alert and oriented x 3. Normal mood.    Objective: Vitals:   05/12/23 1600 05/12/23 2017 05/12/23 2357 05/13/23 0328  BP:  (!) 161/79 (!) 161/78 (!) 146/76  Pulse:  72 70 67  Resp: 18     Temp: 98.4 F (36.9 C) 97.7 F (36.5 C) 97.7 F (36.5 C) 97.6 F (36.4 C)  TempSrc: Oral Oral Oral Oral  SpO2:  97% 98% 98%  Weight:      Height:        Intake/Output Summary (Last 24 hours) at 05/13/2023 0818 Last data filed at 05/13/2023 0600 Gross per 24 hour  Intake 179.15 ml  Output 1200 ml  Net -1020.85 ml   Filed Weights   05/10/23 0535  Weight: 52.2 kg    Scheduled Meds:  gabapentin  1,200 mg Oral QHS   gabapentin  600 mg Oral Daily   lamoTRIgine  100 mg Oral BID   prednisoLONE acetate  1 drop Both Eyes QID   simvastatin  20 mg Oral q1800   traZODone  50 mg Oral QHS   Continuous Infusions:    Nutritional status     Body mass index is  19.74 kg/m.  Data Reviewed:   CBC: Recent Labs  Lab 05/10/23 0537 05/11/23 0257 05/12/23 0218 05/13/23 0724  WBC 5.7 14.8* 11.8* 13.5*  NEUTROABS 2.7  --   --   --   HGB 13.7 12.7 12.2 13.1  HCT 41.2 38.8 37.8 39.4  MCV 102.0* 102.4* 103.0* 102.3*  PLT 192 181 153 158   Basic Metabolic Panel: Recent Labs  Lab 05/10/23 0537 05/11/23 0257 05/12/23 0218 05/13/23 0724  NA 140 143 139 143  K 3.9 4.5 4.3 3.2*  CL 101 105 101 102  CO2 30 29 31 30   GLUCOSE 123* 118* 129* 98  BUN 16 17 16 19   CREATININE 0.98 1.04* 0.81 0.75  CALCIUM 10.0 9.1 9.0 9.3  MG  --   --  2.1 2.1  PHOS  --   --  2.1*  --    GFR: Estimated Creatinine Clearance: 56.2 mL/min (by C-G formula based on SCr of 0.75 mg/dL). Liver  Function Tests: Recent Labs  Lab 05/10/23 0537  AST 31  ALT 24  ALKPHOS 59  BILITOT 0.4  PROT 7.0  ALBUMIN 4.7   Recent Labs  Lab 05/10/23 0537  LIPASE 27   No results for input(s): "AMMONIA" in the last 168 hours. Coagulation Profile: No results for input(s): "INR", "PROTIME" in the last 168 hours. Cardiac Enzymes: No results for input(s): "CKTOTAL", "CKMB", "CKMBINDEX", "TROPONINI" in the last 168 hours. BNP (last 3 results) No results for input(s): "PROBNP" in the last 8760 hours. HbA1C: No results for input(s): "HGBA1C" in the last 72 hours. CBG: Recent Labs  Lab 05/10/23 1752  GLUCAP 254*   Lipid Profile: No results for input(s): "CHOL", "HDL", "LDLCALC", "TRIG", "CHOLHDL", "LDLDIRECT" in the last 72 hours. Thyroid Function Tests: No results for input(s): "TSH", "T4TOTAL", "FREET4", "T3FREE", "THYROIDAB" in the last 72 hours. Anemia Panel: No results for input(s): "VITAMINB12", "FOLATE", "FERRITIN", "TIBC", "IRON", "RETICCTPCT" in the last 72 hours. Sepsis Labs: No results for input(s): "PROCALCITON", "LATICACIDVEN" in the last 168 hours.  No results found for this or any previous visit (from the past 240 hour(s)).       Radiology Studies: DG Abd Portable 1V  Result Date: 05/12/2023 CLINICAL DATA:  Abdominal pain, follow up small bowel obstruction EXAM: PORTABLE ABDOMEN - 1 VIEW COMPARISON:  05/11/2023 FINDINGS: Gastric catheter is again noted within the stomach. Scattered large and small bowel gas is noted. Previously seen small bowel dilatation has resolved. Contrast is noted within the colon. No free air is seen. Postsurgical changes are again noted. IMPRESSION: Previously seen small bowel dilatation has resolved. Electronically Signed   By: Alcide Clever M.D.   On: 05/12/2023 11:18   DG Abd Portable 1V  Result Date: 05/11/2023 CLINICAL DATA:  Small bowel obstruction EXAM: PORTABLE ABDOMEN - 1 VIEW COMPARISON:  Earlier today FINDINGS: An enteric tube tip and  side-port overlaps the stomach. Dilated small bowel loop in the pelvis. Adjacent contrast is presumably within a neighboring small bowel. No colonic contrast is noted. IMPRESSION: Persisting small bowel obstruction with dilated loops in the pelvis. Adjacent contrast is likely within a small bowel loop, no generalized colonic contrast. Electronically Signed   By: Tiburcio Pea M.D.   On: 05/11/2023 16:31           LOS: 3 days   Time spent= 35 mins    Jona Zappone Joline Maxcy, MD [facility]  If 7PM-7AM, please contact night-coverage  05/13/2023, 8:18 AM

## 2023-05-14 ENCOUNTER — Encounter (HOSPITAL_COMMUNITY): Payer: Self-pay | Admitting: Internal Medicine

## 2023-05-14 ENCOUNTER — Encounter (HOSPITAL_COMMUNITY)
Admission: EM | Disposition: A | Discharge status: Home / Self Care | Payer: Self-pay | Source: Home / Self Care | Attending: Internal Medicine

## 2023-05-14 ENCOUNTER — Inpatient Hospital Stay (HOSPITAL_COMMUNITY): Payer: Medicare Other | Admitting: Certified Registered Nurse Anesthetist

## 2023-05-14 ENCOUNTER — Other Ambulatory Visit: Payer: Self-pay

## 2023-05-14 DIAGNOSIS — K565 Intestinal adhesions [bands], unspecified as to partial versus complete obstruction: Secondary | ICD-10-CM | POA: Diagnosis not present

## 2023-05-14 DIAGNOSIS — K5651 Intestinal adhesions [bands], with partial obstruction: Secondary | ICD-10-CM | POA: Diagnosis not present

## 2023-05-14 DIAGNOSIS — K56609 Unspecified intestinal obstruction, unspecified as to partial versus complete obstruction: Secondary | ICD-10-CM | POA: Diagnosis not present

## 2023-05-14 HISTORY — PX: LYSIS OF ADHESION: SHX5961

## 2023-05-14 HISTORY — PX: LAPAROTOMY: SHX154

## 2023-05-14 LAB — CBC
HCT: 36.8 % (ref 36.0–46.0)
Hemoglobin: 12.4 g/dL (ref 12.0–15.0)
MCH: 34.3 pg — ABNORMAL HIGH (ref 26.0–34.0)
MCHC: 33.7 g/dL (ref 30.0–36.0)
MCV: 101.7 fL — ABNORMAL HIGH (ref 80.0–100.0)
Platelets: 155 10*3/uL (ref 150–400)
RBC: 3.62 MIL/uL — ABNORMAL LOW (ref 3.87–5.11)
RDW: 12.4 % (ref 11.5–15.5)
WBC: 9.8 10*3/uL (ref 4.0–10.5)
nRBC: 0 % (ref 0.0–0.2)

## 2023-05-14 LAB — BASIC METABOLIC PANEL
Anion gap: 7 (ref 5–15)
BUN: 19 mg/dL (ref 8–23)
CO2: 29 mmol/L (ref 22–32)
Calcium: 8.7 mg/dL — ABNORMAL LOW (ref 8.9–10.3)
Chloride: 103 mmol/L (ref 98–111)
Creatinine, Ser: 0.6 mg/dL (ref 0.44–1.00)
GFR, Estimated: >60 mL/min (ref >60)
Glucose, Bld: 122 mg/dL — ABNORMAL HIGH (ref 70–99)
Potassium: 3.2 mmol/L — ABNORMAL LOW (ref 3.5–5.1)
Sodium: 139 mmol/L (ref 135–145)

## 2023-05-14 LAB — MAGNESIUM: Magnesium: 2 mg/dL (ref 1.7–2.4)

## 2023-05-14 SURGERY — LAPAROTOMY, EXPLORATORY
Anesthesia: General | Site: Abdomen

## 2023-05-14 MED ORDER — SODIUM CHLORIDE 0.9 % IV SOLN
2.0000 g | INTRAVENOUS | Status: AC
  Filled 2023-05-14: qty 20

## 2023-05-14 MED ORDER — MIDAZOLAM HCL 2 MG/2ML IJ SOLN
INTRAMUSCULAR | Status: DC | PRN
  Administered 2023-05-14: 2 mg via INTRAVENOUS

## 2023-05-14 MED ORDER — LIDOCAINE 2% (20 MG/ML) 5 ML SYRINGE
INTRAMUSCULAR | Status: DC | PRN
  Administered 2023-05-14: 30 mg via INTRAVENOUS

## 2023-05-14 MED ORDER — FENTANYL CITRATE (PF) 250 MCG/5ML IJ SOLN
INTRAMUSCULAR | Status: DC | PRN
  Administered 2023-05-14: 50 ug via INTRAVENOUS
  Administered 2023-05-14 (×2): 100 ug via INTRAVENOUS

## 2023-05-14 MED ORDER — CHLORHEXIDINE GLUCONATE 0.12 % MT SOLN: 15.0000 mL | Freq: Once | OROMUCOSAL | Status: DC

## 2023-05-14 MED ORDER — DEXAMETHASONE SODIUM PHOSPHATE 10 MG/ML IJ SOLN
INTRAMUSCULAR | Status: AC
  Filled 2023-05-14: qty 1

## 2023-05-14 MED ORDER — SODIUM CHLORIDE 0.9 % IV SOLN: INTRAVENOUS | Status: DC | PRN

## 2023-05-14 MED ORDER — FENTANYL CITRATE (PF) 250 MCG/5ML IJ SOLN
INTRAMUSCULAR | Status: AC
  Filled 2023-05-14: qty 5

## 2023-05-14 MED ORDER — METRONIDAZOLE 500 MG/100ML IV SOLN
500.0000 mg | INTRAVENOUS | Status: AC
  Filled 2023-05-14: qty 100

## 2023-05-14 MED ORDER — KETAMINE HCL 10 MG/ML IJ SOLN
INTRAMUSCULAR | Status: DC | PRN
  Administered 2023-05-14: 20 mg via INTRAVENOUS
  Administered 2023-05-14: 10 mg via INTRAVENOUS

## 2023-05-14 MED ORDER — PHENYLEPHRINE 80 MCG/ML (10ML) SYRINGE FOR IV PUSH (FOR BLOOD PRESSURE SUPPORT)
PREFILLED_SYRINGE | INTRAVENOUS | Status: DC | PRN
  Administered 2023-05-14: 80 ug via INTRAVENOUS

## 2023-05-14 MED ORDER — ONDANSETRON HCL 4 MG/2ML IJ SOLN
INTRAMUSCULAR | Status: AC
  Filled 2023-05-14: qty 2

## 2023-05-14 MED ORDER — ONDANSETRON HCL 4 MG/2ML IJ SOLN
INTRAMUSCULAR | Status: DC | PRN
Start: 2023-05-14 — End: 2023-05-14
  Administered 2023-05-14: 4 mg via INTRAVENOUS

## 2023-05-14 MED ORDER — METRONIDAZOLE 500 MG/100ML IV SOLN
INTRAVENOUS | Status: DC | PRN
  Administered 2023-05-14: 500 mg via INTRAVENOUS

## 2023-05-14 MED ORDER — LACTATED RINGERS IV SOLN: INTRAVENOUS | Status: DC

## 2023-05-14 MED ORDER — MIDAZOLAM HCL 2 MG/2ML IJ SOLN
INTRAMUSCULAR | Status: AC
  Filled 2023-05-14: qty 2

## 2023-05-14 MED ORDER — ALBUMIN HUMAN 5 % IV SOLN: INTRAVENOUS | Status: DC | PRN

## 2023-05-14 MED ORDER — ACETAMINOPHEN 10 MG/ML IV SOLN
INTRAVENOUS | Status: DC | PRN
  Administered 2023-05-14: 1000 mg via INTRAVENOUS

## 2023-05-14 MED ORDER — DEXTROSE 5 % IV SOLN
INTRAVENOUS | Status: DC | PRN
  Administered 2023-05-14: 2 g via INTRAVENOUS

## 2023-05-14 MED ORDER — POTASSIUM CHLORIDE 10 MEQ/100ML IV SOLN
10.0000 meq | INTRAVENOUS | Status: AC
  Administered 2023-05-14 (×4): 10 meq via INTRAVENOUS
  Filled 2023-05-14 (×4): qty 100

## 2023-05-14 MED ORDER — ROCURONIUM BROMIDE 10 MG/ML (PF) SYRINGE
PREFILLED_SYRINGE | INTRAVENOUS | Status: DC | PRN
  Administered 2023-05-14: 40 mg via INTRAVENOUS
  Administered 2023-05-14: 10 mg via INTRAVENOUS

## 2023-05-14 MED ORDER — ORAL CARE MOUTH RINSE: 15.0000 mL | Freq: Once | OROMUCOSAL | Status: DC

## 2023-05-14 MED ORDER — SUCCINYLCHOLINE CHLORIDE 200 MG/10ML IV SOSY
PREFILLED_SYRINGE | INTRAVENOUS | Status: AC
  Filled 2023-05-14: qty 10

## 2023-05-14 MED ORDER — EPHEDRINE SULFATE-NACL 50-0.9 MG/10ML-% IV SOSY
PREFILLED_SYRINGE | INTRAVENOUS | Status: DC | PRN
  Administered 2023-05-14: 10 mg via INTRAVENOUS

## 2023-05-14 MED ORDER — ROCURONIUM BROMIDE 10 MG/ML (PF) SYRINGE
PREFILLED_SYRINGE | INTRAVENOUS | Status: AC
  Filled 2023-05-14: qty 10

## 2023-05-14 MED ORDER — KETAMINE HCL 50 MG/5ML IJ SOSY
PREFILLED_SYRINGE | INTRAMUSCULAR | Status: AC
  Filled 2023-05-14: qty 5

## 2023-05-14 MED ORDER — PROPOFOL 10 MG/ML IV BOLUS
INTRAVENOUS | Status: AC
  Filled 2023-05-14: qty 20

## 2023-05-14 MED ORDER — PROPOFOL 10 MG/ML IV BOLUS
INTRAVENOUS | Status: DC | PRN
  Administered 2023-05-14: 100 mg via INTRAVENOUS

## 2023-05-14 MED ORDER — SUGAMMADEX SODIUM 200 MG/2ML IV SOLN
INTRAVENOUS | Status: DC | PRN
  Administered 2023-05-14: 200 mg via INTRAVENOUS

## 2023-05-14 MED ORDER — 0.9 % SODIUM CHLORIDE (POUR BTL) OPTIME
TOPICAL | Status: DC | PRN
  Administered 2023-05-14: 2000 mL

## 2023-05-14 MED ORDER — LIDOCAINE 2% (20 MG/ML) 5 ML SYRINGE
INTRAMUSCULAR | Status: AC
  Filled 2023-05-14: qty 5

## 2023-05-14 MED ORDER — DEXAMETHASONE SODIUM PHOSPHATE 10 MG/ML IJ SOLN
INTRAMUSCULAR | Status: DC | PRN
  Administered 2023-05-14: 5 mg via INTRAVENOUS

## 2023-05-14 MED ORDER — PHENYLEPHRINE HCL-NACL 20-0.9 MG/250ML-% IV SOLN
INTRAVENOUS | Status: DC | PRN
  Administered 2023-05-14: 25 ug/min via INTRAVENOUS

## 2023-05-14 MED ORDER — SUCCINYLCHOLINE CHLORIDE 200 MG/10ML IV SOSY
PREFILLED_SYRINGE | INTRAVENOUS | Status: DC | PRN
  Administered 2023-05-14: 50 mg via INTRAVENOUS

## 2023-05-14 SURGICAL SUPPLY — 43 items
APL PRP STRL LF DISP 70% ISPRP (MISCELLANEOUS) ×1
BAG COUNTER SPONGE SURGICOUNT (BAG) ×1 IMPLANT
BAG SPNG CNTER NS LX DISP (BAG) ×1
BLADE CLIPPER SURG (BLADE) IMPLANT
CANISTER SUCT 3000ML PPV (MISCELLANEOUS) ×1 IMPLANT
CHLORAPREP W/TINT 26 (MISCELLANEOUS) ×1 IMPLANT
COVER SURGICAL LIGHT HANDLE (MISCELLANEOUS) ×1 IMPLANT
DRAPE LAPAROSCOPIC ABDOMINAL (DRAPES) ×1 IMPLANT
DRAPE WARM FLUID 44X44 (DRAPES) ×1 IMPLANT
DRSG OPSITE POSTOP 4X10 (GAUZE/BANDAGES/DRESSINGS) IMPLANT
DRSG OPSITE POSTOP 4X8 (GAUZE/BANDAGES/DRESSINGS) IMPLANT
ELECT BLADE 6.5 EXT (BLADE) IMPLANT
ELECT CAUTERY BLADE 6.4 (BLADE) ×1 IMPLANT
ELECT REM PT RETURN 9FT ADLT (ELECTROSURGICAL) ×1
ELECTRODE REM PT RTRN 9FT ADLT (ELECTROSURGICAL) ×1 IMPLANT
GLOVE BIO SURGEON STRL SZ8 (GLOVE) ×1 IMPLANT
GLOVE BIOGEL PI IND STRL 8 (GLOVE) ×1 IMPLANT
GOWN STRL REUS W/ TWL LRG LVL3 (GOWN DISPOSABLE) ×1 IMPLANT
GOWN STRL REUS W/ TWL XL LVL3 (GOWN DISPOSABLE) ×1 IMPLANT
GOWN STRL REUS W/TWL LRG LVL3 (GOWN DISPOSABLE) ×1
GOWN STRL REUS W/TWL XL LVL3 (GOWN DISPOSABLE) ×1
HANDLE SUCTION POOLE (INSTRUMENTS) ×1 IMPLANT
KIT BASIN OR (CUSTOM PROCEDURE TRAY) ×1 IMPLANT
KIT TURNOVER KIT B (KITS) ×1 IMPLANT
LIGASURE IMPACT 36 18CM CVD LR (INSTRUMENTS) IMPLANT
NS IRRIG 1000ML POUR BTL (IV SOLUTION) ×2 IMPLANT
PACK GENERAL/GYN (CUSTOM PROCEDURE TRAY) ×1 IMPLANT
PAD ARMBOARD 7.5X6 YLW CONV (MISCELLANEOUS) ×1 IMPLANT
PENCIL SMOKE EVACUATOR (MISCELLANEOUS) ×1 IMPLANT
SPECIMEN JAR LARGE (MISCELLANEOUS) IMPLANT
SPONGE T-LAP 18X18 ~~LOC~~+RFID (SPONGE) IMPLANT
STAPLER VISISTAT 35W (STAPLE) ×1 IMPLANT
SUCTION POOLE HANDLE (INSTRUMENTS) ×1
SUT PDS AB 1 TP1 54 (SUTURE) IMPLANT
SUT PDS AB 1 TP1 96 (SUTURE) ×2 IMPLANT
SUT VIC AB 2-0 SH 18 (SUTURE) ×1 IMPLANT
SUT VIC AB 3-0 SH 18 (SUTURE) ×1 IMPLANT
SUT VICRYL AB 2 0 TIES (SUTURE) ×1 IMPLANT
SUT VICRYL AB 3 0 TIES (SUTURE) ×1 IMPLANT
TOWEL GREEN STERILE (TOWEL DISPOSABLE) ×1 IMPLANT
TOWEL GREEN STERILE FF (TOWEL DISPOSABLE) ×1 IMPLANT
TRAY FOLEY MTR SLVR 16FR STAT (SET/KITS/TRAYS/PACK) IMPLANT
YANKAUER SUCT BULB TIP NO VENT (SUCTIONS) IMPLANT

## 2023-05-14 NOTE — Interval H&P Note (Signed)
History and Physical Interval Note:  05/14/2023 11:06 AM  Bethany [name]  has presented today for surgery, with the diagnosis of small bowel resection.  The various methods of treatment have been discussed with the patient and family. After consideration of risks, benefits and other options for treatment, the patient has consented to  Procedure(s): EXPLORATORY LAPAROTOMY , POSSIBLE BOWEL RESECTION (N/A) as a surgical intervention.  The patient's history has been reviewed, patient examined, no change in status, stable for surgery.  I have reviewed the patient's chart and labs.  Questions were answered to the patient's satisfaction.   Discussed procedure and complications and that she has failed medical management   The procedure has been discussed with the patient.  Alternative therapies have been discussed with the patient.  Operative risks include bleeding,  Infection,  Organ injury,  Nerve injury,  Blood vessel injury,  DVT,  Pulmonary embolism,  Death,  And possible reoperation.  Medical management risks include worsening of present situation.  The success of the procedure is 50 -90 % at treating patients symptoms.  The patient understands and agrees to proceed.   Derrek Puff A Dodd Schmid

## 2023-05-14 NOTE — Progress Notes (Addendum)
Initial Nutrition Assessment  DOCUMENTATION CODES:   Not applicable  INTERVENTION:   -TPN management per pharmacy -RD will follow for diet advancement and add supplements as appropriate  NUTRITION DIAGNOSIS:   Increased nutrient needs related to post-op healing as evidenced by estimated needs.  GOAL:   Patient will meet greater than or equal to 90% of their needs  MONITOR:   Diet advancement, Other (Comment) (TPN)  REASON FOR ASSESSMENT:   Consult New TPN/TNA  ASSESSMENT:   Pt with medical history significant of HLD, CPPD, and h/o ETOH/opiate abuse in remission presenting with abdominal pain.  Pt admitted with SBO.   7/9- NGT placed; KUB on this date reveals tube tip and side port overlying proximal stomach 7/12- s/p Exploratory laparotomy with lysis of adhesions and repair of small bowel serosal tear x 3 with full-thickness repair x 1   Reviewed I/O's: -500 ml x 24 hours and -521 ml since admission  NGT output: 500 ml x 24 hours   Pt unavailable at time of visit. Attempted to speak with pt via call to hospital room phone, however, unable to reach. RD unable to obtain further nutrition-related history or complete nutrition-focused physical exam at this time.    Per general surgery notes, pt has had multiple SBO's in the past, but all were treated with conservative management. Per notes on 05/13/23, no improvement on imaging, but no peritonitis. Pt underwent ex lap today.   Pt remains NPO with NGT connected to low, intermittent suction. She has been without adequate nutrition for 4 days this hospitalization. Per pharmacy note, plan to initiate TPN tomorrow. PICC has been ordered.   Reviewed wt hx; wt has been stable over the past 6 months.   Case discussed with pharmacy.  Medications reviewed and include neurontin and potassium chloride.   No results found for: "HGBA1C" PTA DM medications are none.   Labs reviewed: K: 3.2, CBGS: 254 (inpatient orders for glycemic  control are none).    Diet Order:   Diet Order             Diet NPO time specified Except for: Other (See Comments)  Diet effective now                   EDUCATION NEEDS:   No education needs have been identified at this time  Skin:  Skin Assessment: Skin Integrity Issues: Skin Integrity Issues:: Incisions Incisions: closed abdomen  Last BM:  05/10/23  Height:   Ht Readings from Last 1 Encounters:  05/14/23 5\' 5"  (1.651 m)    Weight:   Wt Readings from Last 1 Encounters:  05/14/23 53 kg    Ideal Body Weight:  56.8 kg  BMI:  Body mass index is 19.44 kg/m.  Estimated Nutritional Needs:   Kcal:  1700-1900  Protein:  90-105 grams  Fluid:  1.7-1.9 L    Levada Schilling, RD, LDN, CDCES Registered Dietitian II Certified Diabetes Care and Education Specialist Please refer to [facility] for RD and/or RD on-call/weekend/after hours pager

## 2023-05-14 NOTE — Progress Notes (Addendum)
PHARMACY - TOTAL PARENTERAL NUTRITION CONSULT NOTE  Indication: SBO  Patient Measurements: Height: 5\' 5"  (165.1 cm) Weight: 53 kg (116 lb 13.5 oz) IBW/kg (Calculated) : 57 TPN AdjBW (KG): 53 Body mass index is 19.44 kg/m.  Assessment:  16 YOF presented to [facility] on 7/8 with abdominal pain. PMH significant for calcium pyrophosphate deposition disease, IBS, pSBO, tubular adenoma of colon and narcotic bowel syndrome.  Underwent ex-lap with LoA and SBR in 2019 and had SBO in 2020 and 2023.  No improvement with conservative management, so underwent ex-lap with LoA and repair of SB serosal tear on 7/12.  Patient has been NPO since admission and expect to remain NPO.  Pharmacy consulted to manage TPN.  Glucose / Insulin: no hx DM - AM glucose WNL Electrolytes: K 3.2, others WNL Renal: SCr < 1, BUN WNL Hepatic: N/A Intake / Output; MIVF: NG , off dextrose containing IVF GI Imaging: none since TPN initiation GI Surgeries / Procedures: none since TPN initiation  Central access: PICC ordered on 05/14/23 TPN start date: 05/15/23  Nutritional Goals: RD Assessment:    Current Nutrition:  NPO  Plan:  Start TPN 7/13 as consult received after hour KCL x 4 runs per MD TPN labs and nursing care orders  Iyonna Rish D. Laney Potash, PharmD, BCPS, BCCCP 05/14/2023, 3:07 PM

## 2023-05-14 NOTE — Anesthesia Postprocedure Evaluation (Signed)
Anesthesia Post Note  Patient: Bethany [name]  Procedure(s) Performed: EXPLORATORY LAPAROTOMY (Abdomen) LYSIS OF ADHESION, REPAIR OF SMALL BOWEL SEROSAL TEAR x 3 WITH FULL-THICKNESS REPAIR x 1 (Abdomen)     Patient location during evaluation: PACU Anesthesia Type: General Level of consciousness: awake and alert Pain management: pain level controlled Vital Signs Assessment: post-procedure vital signs reviewed and stable Respiratory status: spontaneous breathing, nonlabored ventilation, respiratory function stable and patient connected to nasal cannula oxygen Cardiovascular status: blood pressure returned to baseline and stable Postop Assessment: no apparent nausea or vomiting Anesthetic complications: no   No notable events documented.  Last Vitals:  Vitals:   05/14/23 1515 05/14/23 1530  BP: (!) 169/71 134/76  Pulse: 67 70  Resp: 11 11  Temp:  (!) 36.4 C  SpO2: 98% 99%    Last Pain:  Vitals:   05/14/23 1555  TempSrc:   PainSc: 9                  Trevor Iha

## 2023-05-14 NOTE — Op Note (Signed)
Preoperative diagnosis: Small bowel obstruction  Postoperative diagnosis small bowel obstruction from dense intra-abdominal adhesions  Procedure: Exploratory laparotomy with lysis of adhesions and repair of small bowel serosal tear x 3 with full-thickness repair x 1  Surgeon: Harriette Bouillon, MD  Anesthesia: General  EBL: 20 cc  Drains: None  Specimen: None  Indications for procedure: The patient is a pleasant 67 year old female who underwent exploratory laparotomy with lysis of adhesions about 5 years ago with small bowel resection.  She has had multiple other obstructions since then but these have all resolved.  She was admitted due to recurrent small bowel obstruction that did not improve with maximal medical management.  She presents for exploratory laparotomy due to failure of medical management of small bowel obstruction.  Risks and benefits of surgery were reviewed.  The possibility of further bowel resection and or additional surgery reviewed the patient and her husband today.  Risk of bleeding, infection, fistula, bowel resection, recurrent obstruction, cardiovascular event, injury to other neighboring structures, and the need for the treatments and/or procedures reviewed with the patient as well as wound complications.  She agrees to proceed.   Description of procedure: The patient was met in the holding area and questions were answered.  She was then taken back to the operative room.  She is placed supine upon the operating table.  After induction of general anesthesia, the abdomen was prepped and draped in sterile fashion and timeout performed.  Previous scar was noted.  We used the previous midline scar for incision.  This was taken down for about halfway between the umbilicus and xiphoid process down to approximately halfway down to the pubic symphysis.  This was opened in the midline.  Is able to get down to the fascia open the fascia.  Upon opening the fascia encountered small bowel  densely adherent to the undersurface of the incision.  I extended my incision more cranial to an area of virgin tissue.  We then opened the midline there and got access into the dental cavity.  Using Kocher's had carefully dissected the bowel off the anterior abdominal wall.  The bowel was quite thin-walled and flimsy and the adhesions were Quite dense.  These were taken down to I freed up the anterior abdominal wall.  Went to the attic and placed retractors.  She had multiple dense adhesions throughout her small bowel.  We spent about an hour doing extensive adhesiolysis carefully.  Due to the very thin nature of her bowel that there were 3 serosal tear was repaired with 3-0 Vicryl.  She did have 1 full-thickness tear repaired with a single layer 3-0 Vicryl.  These were in the distal jejunum and proximal ileum.  She had a previous resection and that was encountered but the central portion of her small bowel from the distal jejunum to the distal ileum was 1 large mass of small intestine.  I did find the adhesive band which was a single adhesive band where the bowel transitioned which was in the distal ileum.  I did lyse this and that it relieved her obstruction.  We tried to lysis many adhesions as we could carefully but given the poor quality of her bowel felt this was futile and would lead to more potential serosal tears.  She was nonobstructed.  I was able to milk contents through the bowels into the cecum.  There was a small serosal tear in the cecum as well and this was oversewn with 3-0 Vicryl.  She had a  large stool burden in her colon.  The appendix was normal.  Gallbladder was normal.  The colon was uninjured as was the bladder.  Liver and stomach grossly normal and NG tube in the stomach.  Irrigation was used and I reexamined all of the serosal tear repair areas.  These were intact without signs of leakage.  There is no signs of obstruction or narrowing of the bowel lumen.  After irrigation we closed the  fascia with single-stranded #1 PDS.  Skin staples were used to close the skin since there was minimal contamination.  Honeycomb dressing applied.  All counts found to be correct.  The patient was then awoke extubated taken to recovery in satisfactory condition.

## 2023-05-14 NOTE — Transfer of Care (Signed)
Immediate Anesthesia Transfer of Care Note  Patient: Bethany [name]  Procedure(s) Performed: EXPLORATORY LAPAROTOMY (Abdomen) LYSIS OF ADHESION, REPAIR OF SMALL BOWEL SEROSAL TEAR x 3 WITH FULL-THICKNESS REPAIR x 1 (Abdomen)  Patient Location: PACU  Anesthesia Type:General  Level of Consciousness: awake, alert , and oriented  Airway & Oxygen Therapy: Patient Spontanous Breathing and Patient connected to face mask oxygen  Post-op Assessment: Report given to RN and Post -op Vital signs reviewed and stable  Post vital signs: Reviewed and stable  Last Vitals:  Vitals Value Taken Time  BP 145/74 05/14/23 1441  Temp    Pulse 71 05/14/23 1445  Resp 13 05/14/23 1445  SpO2 100 % 05/14/23 1445  Vitals shown include unfiled device data.  Last Pain:  Vitals:   05/14/23 0939  TempSrc:   PainSc: 6       Patients Stated Pain Goal: 0 (05/13/23 0042)  Complications: No notable events documented.

## 2023-05-14 NOTE — Progress Notes (Signed)
PROGRESS NOTE    Bethany [name]  JXB:[id number] DOB: [birth year] DOA: 05/10/2023 PCP: Joselyn Arrow, MD   Brief Narrative:  67 year old with history of HLD, CPPD, alcohol/opiate abuse and remission admitted for abdominal pain.  Patient was found to have small bowel obstruction, general surgery was consulted.  NG tube placed due to nausea vomiting, slow to improve.  General surgery to decide if patient will need surgery or not.  05/14/2023: Patient underwent exploratory laparotomy with lysis of adhesions and repair of small bowel serosal tear x 3 with full-thickness repair x 1 today.  Patient seen alongside patient's husband.  No new complaints.  NG tube remains in place.  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  Obstruction have slowly reviewed.  Abdominal x-ray ordered this morning.  General surgery following.  Will make determination today to see if patient will need surgery going forward. D5 1/2 NS today.  05/14/2023: Status post surgery.  See above documentation.  Manage expectantly.  Hypokalemia Replete Potassium.  05/14/2023: Potassium of 3.2.  IV KCl 10 mEq every hour x 4 doses.  Repeat renal panel and electrolytes.  Replete potassium as deemed necessary.  Keep potassium greater than 4.  Acute metabolic encephalopathy, resolved -Suspect overdose from multiple doses of narcotics.  Now improved, closely monitor.   HLD Hold p.o. medications while on low wall suction   Mood d/o P.o. meds currently on hold   Anterior basement membrane dystrophy of eyes -Continue Lotilaner, prednisolone   CODE STATUS CODE STATUS DNR confirmed by admitting provider      DVT prophylaxis: SCDs Start: 05/10/23 1058 Code Status: DNR Family  Communication: Spouse at bedside Status is: Inpatient Home once cleared by general surgery      Diet Orders (From admission, onward)     Start     Ordered   05/11/23 0804  Diet NPO time specified Except for: Other (See Comments)  Diet effective now       Comments: NO MEDICATIONS included in NPO status.  Question:  Except for  Answer:  Other (See Comments)   05/11/23 0804            Subjective: No new complaints. No bowel movement. Patient has not broken wind.  Examination: Constitutional: Not in acute distress; NGT tube in place.  Respiratory: Clear to auscultation bilaterally Cardiovascular: Normal sinus rhythm, no rubs Abdomen: Nontender nondistended good bowel sounds Musculoskeletal: No edema noted  Objective: Vitals:   05/14/23 1445 05/14/23 1500 05/14/23 1515 05/14/23 1530  BP: (!) 156/80 137/81 (!) 169/71 134/76  Pulse: 75 67 67 70  Resp: 16 11 11 11   Temp:    (!) 97.5 F (36.4 C)  TempSrc:      SpO2: 100% 98% 98% 99%  Weight:      Height:        Intake/Output Summary (Last 24 hours) at 05/14/2023 1750 Last data filed at 05/14/2023 1530 Gross per 24 hour  Intake 1350 ml  Output 680 ml  Net 670 ml   Filed Weights   05/10/23 0535 05/14/23 0936  Weight: 52.2 kg 53 kg    Scheduled Meds:  gabapentin  1,200 mg Oral QHS   gabapentin  600 mg  Oral Daily   lamoTRIgine  100 mg Oral BID   prednisoLONE acetate  1 drop Both Eyes QID   simvastatin  20 mg Oral q1800   traZODone  50 mg Oral QHS   Continuous Infusions:  cefTRIAXone (ROCEPHIN)  IV     metronidazole     potassium chloride 10 mEq (05/14/23 1637)     Nutritional status Signs/Symptoms: estimated needs Interventions: TPN, Refer to RD note for recommendations Body mass index is 19.44 kg/m.  Data Reviewed:   CBC: Recent Labs  Lab 05/10/23 0537 05/11/23 0257 05/12/23 0218 05/13/23 0724 05/14/23 0500  WBC 5.7 14.8* 11.8* 13.5* 9.8  NEUTROABS 2.7  --   --   --   --   HGB 13.7 12.7  12.2 13.1 12.4  HCT 41.2 38.8 37.8 39.4 36.8  MCV 102.0* 102.4* 103.0* 102.3* 101.7*  PLT 192 181 153 158 155   Basic Metabolic Panel: Recent Labs  Lab 05/10/23 0537 05/11/23 0257 05/12/23 0218 05/13/23 0724 05/14/23 0500  NA 140 143 139 143 139  K 3.9 4.5 4.3 3.2* 3.2*  CL 101 105 101 102 103  CO2 30 29 31 30 29   GLUCOSE 123* 118* 129* 98 122*  BUN 16 17 16 19 19   CREATININE 0.98 1.04* 0.81 0.75 0.60  CALCIUM 10.0 9.1 9.0 9.3 8.7*  MG  --   --  2.1 2.1 2.0  PHOS  --   --  2.1*  --   --    GFR: Estimated Creatinine Clearance: 57.1 mL/min (by C-G formula based on SCr of 0.6 mg/dL). Liver Function Tests: Recent Labs  Lab 05/10/23 0537  AST 31  ALT 24  ALKPHOS 59  BILITOT 0.4  PROT 7.0  ALBUMIN 4.7   Recent Labs  Lab 05/10/23 0537  LIPASE 27   No results for input(s): "AMMONIA" in the last 168 hours. Coagulation Profile: No results for input(s): "INR", "PROTIME" in the last 168 hours. Cardiac Enzymes: No results for input(s): "CKTOTAL", "CKMB", "CKMBINDEX", "TROPONINI" in the last 168 hours. BNP (last 3 results) No results for input(s): "PROBNP" in the last 8760 hours. HbA1C: No results for input(s): "HGBA1C" in the last 72 hours. CBG: Recent Labs  Lab 05/10/23 1752  GLUCAP 254*   Lipid Profile: No results for input(s): "CHOL", "HDL", "LDLCALC", "TRIG", "CHOLHDL", "LDLDIRECT" in the last 72 hours. Thyroid Function Tests: No results for input(s): "TSH", "T4TOTAL", "FREET4", "T3FREE", "THYROIDAB" in the last 72 hours. Anemia Panel: No results for input(s): "VITAMINB12", "FOLATE", "FERRITIN", "TIBC", "IRON", "RETICCTPCT" in the last 72 hours. Sepsis Labs: No results for input(s): "PROCALCITON", "LATICACIDVEN" in the last 168 hours.  No results found for this or any previous visit (from the past 240 hour(s)).       Radiology Studies: [place] EKG SITE RITE  Result Date: 05/14/2023 If Site Rite image not attached, placement could not be confirmed due to  current cardiac rhythm.  DG Abd Portable 1V  Result Date: 05/13/2023 CLINICAL DATA:  [id number] SBO (small bowel obstruction) (HCC) [id number] EXAM: PORTABLE ABDOMEN - 1 VIEW COMPARISON:  05/12/23 Abdominal radiograph FINDINGS: Paucity of small bowel gas limits the ability to assess for obstruction. There is a dilated loop of bowel that projects over the lower pelvis. Persistent enteric contrast material projects over the pelvis. Enteric tube with side hole above the level of the GE junction, recommend advancement lung bases are clear. No acute osseous abnormality IMPRESSION: 1. Overall paucity of small bowel gas limits the ability to assess for obstruction.  There is a dilated loop of bowel that projects over the lower pelvis. 2. Enteric tube with side hole above the level of the GE junction, recommend advancement. Electronically Signed   By: Lorenza Cambridge M.D.   On: 05/13/2023 08:41           LOS: 4 days   Time spent= 35 mins    Barnetta Chapel, MD [facility]  If 7PM-7AM, please contact night-coverage  05/14/2023, 5:50 PM

## 2023-05-14 NOTE — TOC Progression Note (Signed)
Transition of Care [facility]) - Progression Note    Patient Details  Name: DIANNY OBERHAUSEN MRN: [id number] Date of Birth: 12-Sep-1956  [facility] CM/SW Contact  Gordy Clement, RN Phone Number: 05/14/2023, 4:34 PM  Clinical Narrative:     TOC continuing to follow for DC needs. None identified thus far.          Expected Discharge Plan and Services                                               Social Determinants of Health (SDOH) Interventions SDOH Screenings   Food Insecurity: Unknown (05/10/2023)  Transportation Needs: Unknown (05/10/2023)  Depression (PHQ2-9): Low Risk  (04/28/2023)  Tobacco Use: Medium Risk (05/14/2023)    Readmission Risk Interventions     No data to display

## 2023-05-15 ENCOUNTER — Encounter (HOSPITAL_COMMUNITY): Payer: Self-pay | Admitting: Surgery

## 2023-05-15 DIAGNOSIS — K56609 Unspecified intestinal obstruction, unspecified as to partial versus complete obstruction: Secondary | ICD-10-CM | POA: Diagnosis not present

## 2023-05-15 LAB — COMPREHENSIVE METABOLIC PANEL
ALT: 17 U/L (ref 0–44)
AST: 24 U/L (ref 15–41)
Albumin: 3.1 g/dL — ABNORMAL LOW (ref 3.5–5.0)
Alkaline Phosphatase: 46 U/L (ref 38–126)
Anion gap: 9 (ref 5–15)
BUN: 18 mg/dL (ref 8–23)
CO2: 28 mmol/L (ref 22–32)
Calcium: 8.6 mg/dL — ABNORMAL LOW (ref 8.9–10.3)
Chloride: 103 mmol/L (ref 98–111)
Creatinine, Ser: 0.75 mg/dL (ref 0.44–1.00)
GFR, Estimated: >60 mL/min (ref >60)
Glucose, Bld: 103 mg/dL — ABNORMAL HIGH (ref 70–99)
Potassium: 3.9 mmol/L (ref 3.5–5.1)
Sodium: 140 mmol/L (ref 135–145)
Total Bilirubin: 0.5 mg/dL (ref 0.3–1.2)
Total Protein: 5.6 g/dL — ABNORMAL LOW (ref 6.5–8.1)

## 2023-05-15 LAB — GLUCOSE, CAPILLARY: Glucose-Capillary: 137 mg/dL — ABNORMAL HIGH (ref 70–99)

## 2023-05-15 LAB — CBC
HCT: 39.6 % (ref 36.0–46.0)
Hemoglobin: 13.2 g/dL (ref 12.0–15.0)
MCH: 33.1 pg (ref 26.0–34.0)
MCHC: 33.3 g/dL (ref 30.0–36.0)
MCV: 99.2 fL (ref 80.0–100.0)
Platelets: 177 10*3/uL (ref 150–400)
RBC: 3.99 MIL/uL (ref 3.87–5.11)
RDW: 12.2 % (ref 11.5–15.5)
WBC: 9.1 10*3/uL (ref 4.0–10.5)
nRBC: 0 % (ref 0.0–0.2)

## 2023-05-15 LAB — PHOSPHORUS: Phosphorus: 3.9 mg/dL (ref 2.5–4.6)

## 2023-05-15 LAB — MAGNESIUM: Magnesium: 1.9 mg/dL (ref 1.7–2.4)

## 2023-05-15 MED ORDER — HYDRALAZINE HCL 20 MG/ML IJ SOLN
10.0000 mg | Freq: Four times a day (QID) | INTRAMUSCULAR | Status: DC | PRN
  Administered 2023-05-24: 10 mg via INTRAVENOUS
  Filled 2023-05-15 (×2): qty 1

## 2023-05-15 MED ORDER — INSULIN ASPART 100 UNIT/ML IJ SOLN
0.0000 [IU] | Freq: Four times a day (QID) | INTRAMUSCULAR | Status: DC
  Administered 2023-05-16 – 2023-05-17 (×2): 1 [IU] via SUBCUTANEOUS

## 2023-05-15 MED ORDER — KCL IN DEXTROSE-NACL 10-5-0.45 MEQ/L-%-% IV SOLN
INTRAVENOUS | Status: DC
  Filled 2023-05-15 (×2): qty 1000

## 2023-05-15 MED ORDER — MAGNESIUM SULFATE 2 GM/50ML IV SOLN
2.0000 g | Freq: Once | INTRAVENOUS | Status: AC
  Administered 2023-05-15: 2 g via INTRAVENOUS
  Filled 2023-05-15: qty 50

## 2023-05-15 MED ORDER — NALOXONE HCL 0.4 MG/ML IJ SOLN: 0.4000 mg | INTRAMUSCULAR | Status: DC | PRN

## 2023-05-15 MED ORDER — PANTOPRAZOLE SODIUM 40 MG IV SOLR
40.0000 mg | INTRAVENOUS | Status: DC
  Administered 2023-05-15 – 2023-05-23 (×9): 40 mg via INTRAVENOUS
  Filled 2023-05-15 (×9): qty 10

## 2023-05-15 MED ORDER — SODIUM CHLORIDE 0.9% FLUSH: 10.0000 mL | INTRAVENOUS | Status: DC | PRN

## 2023-05-15 MED ORDER — TRAVASOL 10 % IV SOLN
INTRAVENOUS | Status: AC
  Filled 2023-05-15: qty 648

## 2023-05-15 MED ORDER — CHLORHEXIDINE GLUCONATE CLOTH 2 % EX PADS
6.0000 | MEDICATED_PAD | Freq: Every day | CUTANEOUS | Status: DC
  Administered 2023-05-15 – 2023-05-23 (×9): 6 via TOPICAL

## 2023-05-15 NOTE — Progress Notes (Signed)
Assessment & Plan: POD#1 - status post ex lap with lysis of adhesions - Dr. Luisa Hart, 05/14/2023  - NPO, NG, IVF  - TNA ordered by Dr. Luisa Hart and PICC line placed, start this PM  - OOB, ambulate  - allow ice chips  - per medical service        Darnell Level, MD [facility] practice Office: [phone]        Chief Complaint: SBO  Subjective: Patient in bed, comfortable.  Objective: Vital signs in last 24 hours: Temp:  [97.5 F (36.4 C)-97.9 F (36.6 C)] 97.7 F (36.5 C) (07/12 2300) Pulse Rate:  [67-75] 73 (07/13 0800) Resp:  [11-17] 16 (07/13 0800) BP: (134-169)/(71-85) 151/73 (07/13 0800) SpO2:  [98 %-100 %] 100 % (07/13 0800) Weight:  [54.9 kg] 54.9 kg (07/13 0300) Last BM Date : 05/10/23  Intake/Output from previous day: 07/12 0701 - 07/13 0700 In: 1350 [I.V.:1100; IV Piggyback:250] Out: 530 [Urine:480; Emesis/NG output:20; Blood:30] Intake/Output this shift: No intake/output data recorded.  Physical Exam: HEENT - sclerae clear, mucous membranes moist Neck - soft Abdomen - soft, mild distension; midline wound dry and intact, dressing in place Ext - no edema, non-tender Neuro - alert & oriented, no focal deficits  Lab Results:  Recent Labs    05/14/23 0500 05/15/23 0530  WBC 9.8 9.1  HGB 12.4 13.2  HCT 36.8 39.6  PLT 155 177   BMET Recent Labs    05/14/23 0500 05/15/23 0530  NA 139 140  K 3.2* 3.9  CL 103 103  CO2 29 28  GLUCOSE 122* 103*  BUN 19 18  CREATININE 0.60 0.75  CALCIUM 8.7* 8.6*   PT/INR No results for input(s): "LABPROT", "INR" in the last 72 hours. Comprehensive Metabolic Panel:    Component Value Date/Time   NA 140 05/15/2023 0530   NA 139 05/14/2023 0500   NA 140 04/29/2023 0810   NA 143 04/23/2022 1444   K 3.9 05/15/2023 0530   K 3.2 (L) 05/14/2023 0500   CL 103 05/15/2023 0530   CL 103 05/14/2023 0500   CO2 28 05/15/2023 0530   CO2 29 05/14/2023 0500   BUN 18 05/15/2023 0530   BUN  19 05/14/2023 0500   BUN 17 04/29/2023 0810   BUN 13 04/23/2022 1444   CREATININE 0.75 05/15/2023 0530   CREATININE 0.60 05/14/2023 0500   CREATININE 1.05 (H) 03/26/2017 0929   CREATININE 1.02 (H) 01/07/2016 0001   GLUCOSE 103 (H) 05/15/2023 0530   GLUCOSE 122 (H) 05/14/2023 0500   CALCIUM 8.6 (L) 05/15/2023 0530   CALCIUM 8.7 (L) 05/14/2023 0500   AST 24 05/15/2023 0530   AST 31 05/10/2023 0537   ALT 17 05/15/2023 0530   ALT 24 05/10/2023 0537   ALKPHOS 46 05/15/2023 0530   ALKPHOS 59 05/10/2023 0537   BILITOT 0.5 05/15/2023 0530   BILITOT 0.4 05/10/2023 0537   BILITOT 0.3 04/29/2023 0810   BILITOT 0.3 04/23/2022 1444   PROT 5.6 (L) 05/15/2023 0530   PROT 7.0 05/10/2023 0537   PROT 6.9 04/29/2023 0810   PROT 6.9 04/23/2022 1444   ALBUMIN 3.1 (L) 05/15/2023 0530   ALBUMIN 4.7 05/10/2023 0537   ALBUMIN 4.8 04/29/2023 0810   ALBUMIN 4.7 04/23/2022 1444    Studies/Results: [place] EKG SITE RITE  Result Date: 05/14/2023 If Site Rite image not attached, placement could not be confirmed due to current cardiac rhythm.     Darnell Level 05/15/2023  Patient ID:  Bethany [name], female   DOB: 03-30-1956, 67 y.o.   MRN: [id number]

## 2023-05-15 NOTE — Evaluation (Signed)
Physical Therapy Evaluation Patient Details Name: Bethany [name] MRN: [id number] DOB: 31-Mar-1956 Today's Date: 05/15/2023  History of Present Illness  Pt is a 67 y.o. female presenting with abdominal pain.  w/u revealed SBO and pt underwent exp lap with lysis of adhesions and repair of small bowel tear. PMH significant of HLD, CPPD, and h/o ETOH/opiate abuse in remission, h/o SBO x 4  Clinical Impression  Patient presents with mild dependencies in gait and mobility mostly limited by pain.  Anticipate that as pain decreased she will return to baseline mobility (independent).  PT will follow in hospital to ensure progression of gait and transfers to independent.  Recommend pt ambulate daily with nursing/mobility specialists as well.         Assistance Recommended at Discharge None  If plan is discharge home, recommend the following:  Can travel by private vehicle           Equipment Recommendations None recommended by PT  Recommendations for Other Services       Functional Status Assessment Patient has had a recent decline in their functional status and demonstrates the ability to make significant improvements in function in a reasonable and predictable amount of time.     Precautions / Restrictions Precautions Precautions: Fall      Mobility  Bed Mobility Overal bed mobility: Needs Assistance Bed Mobility: Rolling, Sidelying to Sit, Sit to Sidelying Rolling: Supervision Sidelying to sit: Min assist     Sit to sidelying: Min assist General bed mobility comments: required cueing to try to go to sidelying insteand of supine to/from sit    Transfers Overall transfer level: Needs assistance Equipment used: Rolling walker (2 wheels) Transfers: Sit to/from Stand Sit to Stand: Min guard                Ambulation/Gait Ambulation/Gait assistance: Min guard Gait Distance (Feet): 60 Feet Assistive device: Rolling walker (2 wheels) Gait Pattern/deviations:  Step-through pattern Gait velocity: slow pace        Stairs            Wheelchair Mobility     Tilt Bed    Modified Rankin (Stroke Patients Only)       Balance Overall balance assessment: Needs assistance Sitting-balance support: No upper extremity supported, Feet supported Sitting balance-Leahy Scale: Good     Standing balance support: Bilateral upper extremity supported, Reliant on assistive device for balance Standing balance-Leahy Scale: Poor                               Pertinent Vitals/Pain Pain Assessment Pain Assessment: 0-10 Pain Score: 7  Pain Location: abdomin (after gait) Pain Descriptors / Indicators: Cramping, Discomfort Pain Intervention(s): Limited activity within patient's tolerance, Monitored during session, Patient requesting pain meds-RN notified    Home Living Family/patient expects to be discharged to:: Private residence   Available Help at Discharge: Family;Friend(s) Type of Home: House Home Access: Stairs to enter Entrance Stairs-Rails: None Entrance Stairs-Number of Steps: 2   Home Layout: One level Home Equipment: None      Prior Function Prior Level of Function : Independent/Modified Independent             Mobility Comments: pt plays pickleball regularly       Hand Dominance        Extremity/Trunk Assessment   Upper Extremity Assessment Upper Extremity Assessment: Overall WFL for tasks assessed    Lower Extremity Assessment Lower Extremity  Assessment: Overall WFL for tasks assessed    Cervical / Trunk Assessment Cervical / Trunk Assessment: Normal  Communication   Communication: No difficulties  Cognition Arousal/Alertness: Awake/alert Behavior During Therapy: WFL for tasks assessed/performed Overall Cognitive Status: Within Functional Limits for tasks assessed                                          General Comments      Exercises     Assessment/Plan    PT  Assessment Patient needs continued PT services  PT Problem List Decreased activity tolerance;Decreased balance;Decreased mobility;Pain       PT Treatment Interventions Gait training;Functional mobility training;Therapeutic activities;Therapeutic exercise;Balance training;Patient/family education    PT Goals (Current goals can be found in the Care Plan section)  Acute Rehab PT Goals Patient Stated Goal: stop hurting PT Goal Formulation: With patient Time For Goal Achievement: 05/22/23 Potential to Achieve Goals: Good    Frequency Min 1X/week     Co-evaluation               AM-PAC PT "6 Clicks" Mobility  Outcome Measure Help needed turning from your back to your side while in a flat bed without using bedrails?: A Little Help needed moving from lying on your back to sitting on the side of a flat bed without using bedrails?: A Little Help needed moving to and from a bed to a chair (including a wheelchair)?: A Little Help needed standing up from a chair using your arms (e.g., wheelchair or bedside chair)?: A Little Help needed to walk in hospital room?: A Little Help needed climbing 3-5 steps with a railing? : A Little 6 Click Score: 18    End of Session   Activity Tolerance: Patient tolerated treatment well Patient left: in bed;with call bell/phone within reach;with nursing/sitter in room Nurse Communication: Mobility status PT Visit Diagnosis: Unsteadiness on feet (R26.81);Pain    Time: 2952-8413 PT Time Calculation (min) (ACUTE ONLY): 18 min   Charges:   PT Evaluation $PT Eval Moderate Complexity: 1 Mod   PT General Charges $$ ACUTE PT VISIT: 1 Visit         05/15/2023 Bethany [name], PT Acute Rehabilitation Services Office:  [phone]   Bethany [name] 05/15/2023, 12:44 PM

## 2023-05-15 NOTE — Progress Notes (Signed)
PHARMACY - TOTAL PARENTERAL NUTRITION CONSULT NOTE  Indication: SBO  Patient Measurements: Height: 5\' 5"  (165.1 cm) Weight: 54.9 kg (121 lb 0.5 oz) IBW/kg (Calculated) : 57 TPN AdjBW (KG): 53 Body mass index is 20.14 kg/m.  Assessment:  77 YOF presented to [facility] on 7/8 with abdominal pain. PMH significant for calcium pyrophosphate deposition disease, IBS, multiple pSBO, tubular adenoma of colon and narcotic bowel syndrome.  Underwent ex-lap with LoA and SBR in 2019 and had SBO in 2020 and 2023.  No improvement with conservative management, so underwent ex-lap with LoA and repair of SB serosal tear on 7/12.  Patient has been NPO since admission 7/8 and expect to remain NPO. Patient received IVF with D5 7/9- 7/10am and 7/11-7/12 am, which reduces refeeding risk. Pharmacy consulted to manage TPN.  Glucose / Insulin: no hx DM, BG < 120 -dexamethasone given 7/12  Electrolytes: K 3.9 (received 40 IV), CoCa 9.3, Mg 1.9 (goal >/=4), others WNL Renal: SCr 0.75, BUN WNL Hepatic: LFTs/Tbili wnl, albumin 3.1 Intake / Output; MIVF: UOP 0.4 ml/kg/hr (likely not completely charted), NG 20mL, EBL 30ml GI Imaging:   7/8 CT: dilated CB loop, possible early partial CBO  7/8 KUB SBO protocol- contrast in stomach and dilated loops of CB 7/9 KUB: persisting SBO, contrast in SB loop 7/10 KUB: scattered large and CB gas, contrast in colon  7/11 KUB:  paucity of SB gas limits the ability to assess for obstruction. There is a dilated loop of bowel. GI Surgeries / Procedures:  7/9 NGT placed 7/12 ex-lap with LoA and repair of SB serosal tear  Central access: PICC ordered 05/14/23, pending placement  TPN start date: 05/15/23  Nutritional Goals: RD Assessment: Estimated Needs Total Energy Estimated Needs: 1700-1900 Total Protein Estimated Needs: 90-105 grams Total Fluid Estimated Needs: 1.7-1.9 L  Goal TPN: Goal rate of 70 ml/hr provides 91g protein and 1724 kcals  Current Nutrition:  NPO  Plan:    Start TPN at 50 mL/hr at 1800, provides 65g protein and 1231 kcals, meeting ~70% estimated needs  Electrolytes in TPN: Na 75 mEq/L, K 45 mEq/L, Ca 3 mEq/L, Mg 10 mEq/L, and Phos 14 mmol/L. Cl:Ac 1:1 Give Mg 2g IV x1  Add standard MVI and trace elements to TPN Initiate Sensitive q6h SSI and adjust as needed  Monitor TPN labs daily until stable at goal then on Mon/Thurs   Alphia Moh, PharmD, BCPS, [facility] Clinical Pharmacist  Please check AMION for all [facility] phone numbers After 10:00 PM, call Main Pharmacy [phone]

## 2023-05-15 NOTE — Progress Notes (Signed)
PROGRESS NOTE                                                                                                                                                                                                             Patient Demographics:    Bethany [name], is a 67 y.o. female, DOB - 1956-10-15, ZOX:[id number]  Outpatient Primary MD for the patient is Joselyn Arrow, MD    LOS - 5  Admit date - 05/10/2023    Chief Complaint  Patient presents with   Abdominal Pain       Brief Narrative (HPI from H&P)   67 year old with history of HLD, CPPD, alcohol/opiate abuse and remission admitted for abdominal pain.  Workup showed SBO underwent surgical correction on 05/14/2023.   Subjective:    Bethany [name] today has, No headache, No chest pain, No abdominal pain - No Nausea, No new weakness tingling or numbness, no SOB, not passing flatus or having bowel movements.   Assessment  & Plan :    SBO.  Failed conservative management seen by general surgery underwent surgical correction by Dr. Luisa Hart on 05/14/2023, still has NG tube, currently no bowel activity, PICC line with TNA to be started on 05/15/2023, defer further management of this issue to general surgery.  Acute toxic encephalopathy caused by an excessive use of narcotics in the hospital, resolved.  Monitor  Dyslipidemia, mood disorder.  Resume home medications once taking p.o.  Anterior basement membrane dystrophy of eyes.  Continue home eyedrops       Condition - Fair  Family Communication  : Bedside 05/15/2023  Code Status : DNR  Consults  : General surgery  PUD Prophylaxis : PPI   Procedures  :     Laparotomy by Dr. Luisa Hart general surgery on 05/14/2023 for SBO      Disposition Plan  :    Status is: Inpatient  DVT Prophylaxis  :    SCDs Start: 05/10/23 1058    Lab Results  Component Value Date   PLT 177 05/15/2023    Diet :  Diet Order              Diet NPO time specified Except for: Other (See Comments)  Diet effective now  Inpatient Medications  Scheduled Meds:  gabapentin  1,200 mg Oral QHS   gabapentin  600 mg Oral Daily   [START ON 05/16/2023] insulin aspart  0-9 Units Subcutaneous Q6H   lamoTRIgine  100 mg Oral BID   prednisoLONE acetate  1 drop Both Eyes QID   simvastatin  20 mg Oral q1800   traZODone  50 mg Oral QHS   Continuous Infusions:  TPN ADULT (ION)     PRN Meds:.acetaminophen **OR** acetaminophen, guaiFENesin, hydrALAZINE, HYDROmorphone (DILAUDID) injection, ipratropium-albuterol, ketorolac, LORazepam, metoprolol tartrate, ondansetron **OR** ondansetron (ZOFRAN) IV, senna-docusate, traZODone    Objective:   Vitals:   05/14/23 1900 05/14/23 2300 05/15/23 0300 05/15/23 0800  BP: (!) 161/83 (!) 141/85  (!) 151/73  Pulse:    73  Resp: 15 17  16   Temp: 97.9 F (36.6 C) 97.7 F (36.5 C)    TempSrc: Oral Oral    SpO2:    100%  Weight:   54.9 kg   Height:        Wt Readings from Last 3 Encounters:  05/15/23 54.9 kg  05/05/23 52.6 kg  04/28/23 52.2 kg     Intake/Output Summary (Last 24 hours) at 05/15/2023 1051 Last data filed at 05/15/2023 0300 Gross per 24 hour  Intake 1350 ml  Output 530 ml  Net 820 ml     Physical Exam  Awake Alert, No new F.N deficits, NG [place].AT,PERRAL Supple Neck, No JVD,   Symmetrical Chest wall movement, Good air movement bilaterally, CTAB RRR,No Gallops,Rubs or new Murmurs,  No B.Sounds, Abd Soft, No tenderness,   No Cyanosis, Clubbing or edema        Data Review:    Recent Labs  Lab 05/10/23 0537 05/11/23 0257 05/12/23 0218 05/13/23 0724 05/14/23 0500 05/15/23 0530  WBC 5.7 14.8* 11.8* 13.5* 9.8 9.1  HGB 13.7 12.7 12.2 13.1 12.4 13.2  HCT 41.2 38.8 37.8 39.4 36.8 39.6  PLT 192 181 153 158 155 177  MCV 102.0* 102.4* 103.0* 102.3* 101.7* 99.2  MCH 33.9 33.5 33.2 34.0 34.3* 33.1  MCHC 33.3 32.7 32.3 33.2 33.7 33.3  RDW 12.5  12.6 12.8 12.4 12.4 12.2  LYMPHSABS 2.3  --   --   --   --   --   MONOABS 0.4  --   --   --   --   --   EOSABS 0.2  --   --   --   --   --   BASOSABS 0.1  --   --   --   --   --     Recent Labs  Lab 05/10/23 0537 05/11/23 0257 05/12/23 0218 05/13/23 0724 05/14/23 0500 05/15/23 0530  NA 140 143 139 143 139 140  K 3.9 4.5 4.3 3.2* 3.2* 3.9  CL 101 105 101 102 103 103  CO2 30 29 31 30 29 28   ANIONGAP 9 9 7 11 7 9   GLUCOSE 123* 118* 129* 98 122* 103*  BUN 16 17 16 19 19 18   CREATININE 0.98 1.04* 0.81 0.75 0.60 0.75  AST 31  --   --   --   --  24  ALT 24  --   --   --   --  17  ALKPHOS 59  --   --   --   --  46  BILITOT 0.4  --   --   --   --  0.5  ALBUMIN 4.7  --   --   --   --  3.1*  MG  --   --  2.1 2.1 2.0 1.9  CALCIUM 10.0 9.1 9.0 9.3 8.7* 8.6*      Recent Labs  Lab 05/11/23 0257 05/12/23 0218 05/13/23 0724 05/14/23 0500 05/15/23 0530  MG  --  2.1 2.1 2.0 1.9  CALCIUM 9.1 9.0 9.3 8.7* 8.6*    Recent Labs  Lab 05/11/23 0257 05/12/23 0218 05/13/23 0724 05/14/23 0500 05/15/23 0530  WBC 14.8* 11.8* 13.5* 9.8 9.1  PLT 181 153 158 155 177  CREATININE 1.04* 0.81 0.75 0.60 0.75    ------------------------------------------------------------------------------------------------------------------ Lab Results  Component Value Date   CHOL 220 (H) 04/29/2023   HDL 102 04/29/2023   LDLCALC 106 (H) 04/29/2023   TRIG 68 04/29/2023   CHOLHDL 2.2 04/29/2023     Radiology Reports [place] EKG SITE RITE  Result Date: 05/14/2023 If Site Rite image not attached, placement could not be confirmed due to current cardiac rhythm.  DG Abd Portable 1V  Result Date: 05/13/2023 CLINICAL DATA:  [id number] SBO (small bowel obstruction) (HCC) [id number] EXAM: PORTABLE ABDOMEN - 1 VIEW COMPARISON:  05/12/23 Abdominal radiograph FINDINGS: Paucity of small bowel gas limits the ability to assess for obstruction. There is a dilated loop of bowel that projects over the lower pelvis. Persistent  enteric contrast material projects over the pelvis. Enteric tube with side hole above the level of the GE junction, recommend advancement lung bases are clear. No acute osseous abnormality IMPRESSION: 1. Overall paucity of small bowel gas limits the ability to assess for obstruction. There is a dilated loop of bowel that projects over the lower pelvis. 2. Enteric tube with side hole above the level of the GE junction, recommend advancement. Electronically Signed   By: Lorenza Cambridge M.D.   On: 05/13/2023 08:41   DG Abd Portable 1V  Result Date: 05/12/2023 CLINICAL DATA:  Abdominal pain, follow up small bowel obstruction EXAM: PORTABLE ABDOMEN - 1 VIEW COMPARISON:  05/11/2023 FINDINGS: Gastric catheter is again noted within the stomach. Scattered large and small bowel gas is noted. Previously seen small bowel dilatation has resolved. Contrast is noted within the colon. No free air is seen. Postsurgical changes are again noted. IMPRESSION: Previously seen small bowel dilatation has resolved. Electronically Signed   By: Alcide Clever M.D.   On: 05/12/2023 11:18   DG Abd Portable 1V  Result Date: 05/11/2023 CLINICAL DATA:  Small bowel obstruction EXAM: PORTABLE ABDOMEN - 1 VIEW COMPARISON:  Earlier today FINDINGS: An enteric tube tip and side-port overlaps the stomach. Dilated small bowel loop in the pelvis. Adjacent contrast is presumably within a neighboring small bowel. No colonic contrast is noted. IMPRESSION: Persisting small bowel obstruction with dilated loops in the pelvis. Adjacent contrast is likely within a small bowel loop, no generalized colonic contrast. Electronically Signed   By: Tiburcio Pea M.D.   On: 05/11/2023 16:31      Signature  -   Susa Raring M.D on 05/15/2023 at 10:51 AM   -  To page go to [email or web address]

## 2023-05-15 NOTE — Progress Notes (Signed)
Peripherally Inserted Central Catheter Placement  The IV Nurse has discussed with the patient and/or persons authorized to consent for the patient, the purpose of this procedure and the potential benefits and risks involved with this procedure.  The benefits include less needle sticks, lab draws from the catheter, and the patient may be discharged home with the catheter. Risks include, but not limited to, infection, bleeding, blood clot (thrombus formation), and puncture of an artery; nerve damage and irregular heartbeat and possibility to perform a PICC exchange if needed/ordered by physician.  Alternatives to this procedure were also discussed.  Bard Power PICC patient education guide, fact sheet on infection prevention and patient information card has been provided to patient /or left at bedside.    PICC Placement Documentation  PICC Double Lumen 05/15/23 Right Brachial 34 cm 0 cm (Active)  Indication for Insertion or Continuance of Line Administration of hyperosmolar/irritating solutions (i.e. TPN, Vancomycin, etc.) 05/15/23 1304  Exposed Catheter (cm) 0 cm 05/15/23 1304  Site Assessment Clean, Dry, Intact 05/15/23 1304  Lumen #1 Status Flushed;Saline locked;Blood return noted 05/15/23 1304  Lumen #2 Status Flushed;Saline locked;Blood return noted 05/15/23 1304  Dressing Type Transparent;Securing device 05/15/23 1304  Dressing Status Antimicrobial disc in place;Clean, Dry, Intact 05/15/23 1304  Safety Lock Not Applicable 05/15/23 1304  Line Care Connections checked and tightened 05/15/23 1304  Line Adjustment (NICU/IV Team Only) No 05/15/23 1304  Dressing Intervention New dressing 05/15/23 1304  Dressing Change Due 05/22/23 05/15/23 1304       Bethany [name] 05/15/2023, 1:05 PM

## 2023-05-16 DIAGNOSIS — K56609 Unspecified intestinal obstruction, unspecified as to partial versus complete obstruction: Secondary | ICD-10-CM | POA: Diagnosis not present

## 2023-05-16 LAB — CBC
HCT: 35.4 % — ABNORMAL LOW (ref 36.0–46.0)
Hemoglobin: 11.8 g/dL — ABNORMAL LOW (ref 12.0–15.0)
MCH: 34.3 pg — ABNORMAL HIGH (ref 26.0–34.0)
MCHC: 33.3 g/dL (ref 30.0–36.0)
MCV: 102.9 fL — ABNORMAL HIGH (ref 80.0–100.0)
Platelets: 174 10*3/uL (ref 150–400)
RBC: 3.44 MIL/uL — ABNORMAL LOW (ref 3.87–5.11)
RDW: 12.3 % (ref 11.5–15.5)
WBC: 9.6 10*3/uL (ref 4.0–10.5)
nRBC: 0 % (ref 0.0–0.2)

## 2023-05-16 LAB — GLUCOSE, CAPILLARY
Glucose-Capillary: 172 mg/dL — ABNORMAL HIGH (ref 70–99)
Glucose-Capillary: 127 mg/dL — ABNORMAL HIGH (ref 70–99)
Glucose-Capillary: 126 mg/dL — ABNORMAL HIGH (ref 70–99)
Glucose-Capillary: 124 mg/dL — ABNORMAL HIGH (ref 70–99)

## 2023-05-16 LAB — BASIC METABOLIC PANEL
Anion gap: 6 (ref 5–15)
BUN: 13 mg/dL (ref 8–23)
CO2: 31 mmol/L (ref 22–32)
Calcium: 8.4 mg/dL — ABNORMAL LOW (ref 8.9–10.3)
Chloride: 103 mmol/L (ref 98–111)
Creatinine, Ser: 0.63 mg/dL (ref 0.44–1.00)
GFR, Estimated: >60 mL/min (ref >60)
Glucose, Bld: 138 mg/dL — ABNORMAL HIGH (ref 70–99)
Potassium: 3.4 mmol/L — ABNORMAL LOW (ref 3.5–5.1)
Sodium: 140 mmol/L (ref 135–145)

## 2023-05-16 LAB — PHOSPHORUS: Phosphorus: 1.9 mg/dL — ABNORMAL LOW (ref 2.5–4.6)

## 2023-05-16 LAB — MAGNESIUM: Magnesium: 2 mg/dL (ref 1.7–2.4)

## 2023-05-16 MED ORDER — METHOCARBAMOL 1000 MG/10ML IJ SOLN
500.0000 mg | Freq: Four times a day (QID) | INTRAVENOUS | Status: DC | PRN
  Filled 2023-05-16: qty 5

## 2023-05-16 MED ORDER — POTASSIUM CHLORIDE 2 MEQ/ML IV SOLN
INTRAVENOUS | Status: DC
Start: 2023-05-16 — End: 2023-05-16

## 2023-05-16 MED ORDER — POTASSIUM PHOSPHATES 15 MMOLE/5ML IV SOLN
30.0000 mmol | Freq: Once | INTRAVENOUS | Status: AC
  Administered 2023-05-16: 30 mmol via INTRAVENOUS
  Filled 2023-05-16: qty 10

## 2023-05-16 MED ORDER — POTASSIUM CHLORIDE 2 MEQ/ML IV SOLN
INTRAVENOUS | Status: AC
  Filled 2023-05-16: qty 1000

## 2023-05-16 MED ORDER — ACETAMINOPHEN 10 MG/ML IV SOLN
1000.0000 mg | Freq: Four times a day (QID) | INTRAVENOUS | Status: AC
  Administered 2023-05-17 (×2): 1000 mg via INTRAVENOUS
  Filled 2023-05-16 (×4): qty 100

## 2023-05-16 MED ORDER — TRAVASOL 10 % IV SOLN
INTRAVENOUS | Status: AC
  Filled 2023-05-16: qty 907.2

## 2023-05-16 MED ORDER — HYDROMORPHONE HCL 1 MG/ML IJ SOLN
0.5000 mg | INTRAMUSCULAR | Status: DC | PRN
  Administered 2023-05-16 (×3): 0.5 mg via INTRAVENOUS
  Administered 2023-05-16: 1 mg via INTRAVENOUS
  Administered 2023-05-17 – 2023-05-19 (×14): 0.5 mg via INTRAVENOUS
  Filled 2023-05-16 (×18): qty 0.5
  Filled 2023-05-16: qty 1

## 2023-05-16 NOTE — Progress Notes (Signed)
PROGRESS NOTE                                                                                                                                                                                                             Patient Demographics:    Bethany [name], is a 67 y.o. female, DOB - [birth year], ZOX:[id number]  Outpatient Primary MD for the patient is Joselyn Arrow, MD    LOS - 6  Admit date - 05/10/2023    Chief Complaint  Patient presents with   Abdominal Pain       Brief Narrative (HPI from H&P)   67 year old with history of HLD, CPPD, alcohol/opiate abuse and remission admitted for abdominal pain.  Workup showed SBO underwent surgical correction on 05/14/2023.   Subjective:    Laquiesha Vandenburgh today has, No headache, No chest pain, No abdominal pain - No Nausea, No new weakness tingling or numbness, no SOB, not passing flatus or having bowel movements.   Assessment  & Plan :    SBO.  Failed conservative management seen by general surgery underwent surgical correction by Dr. Luisa Hart on 05/14/2023, still has NG tube, currently no bowel activity, has have some bowel sounds today, PICC line with TNA started on 05/15/2023 per general surgery, defer further management of this issue to general surgery.  Acute toxic encephalopathy caused by an excessive use of narcotics in the hospital, resolved.  Monitor  Dyslipidemia, mood disorder.  Resume home medications once taking p.o.  Anterior basement membrane dystrophy of eyes.  Continue home eyedrops  Hypokalemia and hypophosphatemia.  Replaced.       Condition - Fair  Family Communication  : Bedside 05/15/2023  Code Status : DNR  Consults  : General surgery  PUD Prophylaxis : PPI   Procedures  :     Laparotomy by Dr. Luisa Hart general surgery on 05/14/2023 for SBO      Disposition Plan  :    Status is: Inpatient  DVT Prophylaxis  :    SCDs Start: 05/10/23 1058     Lab Results  Component Value Date   PLT 174 05/16/2023    Diet :  Diet Order             Diet NPO time specified Except for: Other (See Comments), Ice Chips  Diet effective now  Inpatient Medications  Scheduled Meds:  Chlorhexidine Gluconate Cloth  6 each Topical Daily   gabapentin  1,200 mg Oral QHS   gabapentin  600 mg Oral Daily   insulin aspart  0-9 Units Subcutaneous Q6H   lamoTRIgine  100 mg Oral BID   pantoprazole (PROTONIX) IV  40 mg Intravenous Q24H   prednisoLONE acetate  1 drop Both Eyes QID   simvastatin  20 mg Oral q1800   traZODone  50 mg Oral QHS   Continuous Infusions:  dextrose 5 % 1,000 mL with potassium chloride 40 mEq infusion 50 mL/hr at 05/16/23 0753   potassium PHOSPHATE IVPB (in mmol)     TPN ADULT (ION) 50 mL/hr at 05/16/23 0749   PRN Meds:.acetaminophen **OR** acetaminophen, guaiFENesin, hydrALAZINE, HYDROmorphone (DILAUDID) injection, ipratropium-albuterol, LORazepam, metoprolol tartrate, naLOXone (NARCAN)  injection, ondansetron **OR** ondansetron (ZOFRAN) IV, senna-docusate, sodium chloride flush, traZODone    Objective:   Vitals:   05/15/23 2353 05/16/23 0329 05/16/23 0500 05/16/23 0800  BP: (!) 167/84 (!) 171/71  (!) 179/79  Pulse: 69 71  70  Resp: 20 20    Temp: 98.1 F (36.7 C) 98.5 F (36.9 C)    TempSrc:      SpO2: 96% 95%  99%  Weight:   54.4 kg   Height:        Wt Readings from Last 3 Encounters:  05/16/23 54.4 kg  05/05/23 52.6 kg  04/28/23 52.2 kg     Intake/Output Summary (Last 24 hours) at 05/16/2023 0813 Last data filed at 05/16/2023 0749 Gross per 24 hour  Intake 2255.08 ml  Output 1800 ml  Net 455.08 ml     Physical Exam  Awake Alert, No new F.N deficits, NG [place].AT,PERRAL Supple Neck, No JVD,   Symmetrical Chest wall movement, Good air movement bilaterally, CTAB RRR,No Gallops,Rubs or new Murmurs,  hypoactive B.Sounds, Abd Soft, No tenderness,   No Cyanosis, Clubbing or edema         Data Review:    Recent Labs  Lab 05/10/23 0537 05/11/23 0257 05/12/23 0218 05/13/23 0724 05/14/23 0500 05/15/23 0530 05/16/23 0306  WBC 5.7   < > 11.8* 13.5* 9.8 9.1 9.6  HGB 13.7   < > 12.2 13.1 12.4 13.2 11.8*  HCT 41.2   < > 37.8 39.4 36.8 39.6 35.4*  PLT 192   < > 153 158 155 177 174  MCV 102.0*   < > 103.0* 102.3* 101.7* 99.2 102.9*  MCH 33.9   < > 33.2 34.0 34.3* 33.1 34.3*  MCHC 33.3   < > 32.3 33.2 33.7 33.3 33.3  RDW 12.5   < > 12.8 12.4 12.4 12.2 12.3  LYMPHSABS 2.3  --   --   --   --   --   --   MONOABS 0.4  --   --   --   --   --   --   EOSABS 0.2  --   --   --   --   --   --   BASOSABS 0.1  --   --   --   --   --   --    < > = values in this interval not displayed.    Recent Labs  Lab 05/10/23 0537 05/11/23 0257 05/12/23 0218 05/13/23 0724 05/14/23 0500 05/15/23 0530 05/16/23 0306  NA 140   < > 139 143 139 140 140  K 3.9   < > 4.3 3.2* 3.2* 3.9 3.4*  CL  101   < > 101 102 103 103 103  CO2 30   < > 31 30 29 28 31   ANIONGAP 9   < > 7 11 7 9 6   GLUCOSE 123*   < > 129* 98 122* 103* 138*  BUN 16   < > 16 19 19 18 13   CREATININE 0.98   < > 0.81 0.75 0.60 0.75 0.63  AST 31  --   --   --   --  24  --   ALT 24  --   --   --   --  17  --   ALKPHOS 59  --   --   --   --  46  --   BILITOT 0.4  --   --   --   --  0.5  --   ALBUMIN 4.7  --   --   --   --  3.1*  --   MG  --   --  2.1 2.1 2.0 1.9 2.0  CALCIUM 10.0   < > 9.0 9.3 8.7* 8.6* 8.4*   < > = values in this interval not displayed.      Recent Labs  Lab 05/12/23 0218 05/13/23 0724 05/14/23 0500 05/15/23 0530 05/16/23 0306  MG 2.1 2.1 2.0 1.9 2.0  CALCIUM 9.0 9.3 8.7* 8.6* 8.4*    Recent Labs  Lab 05/12/23 0218 05/13/23 0724 05/14/23 0500 05/15/23 0530 05/16/23 0306  WBC 11.8* 13.5* 9.8 9.1 9.6  PLT 153 158 155 177 174  CREATININE 0.81 0.75 0.60 0.75 0.63    ------------------------------------------------------------------------------------------------------------------ Lab  Results  Component Value Date   CHOL 220 (H) 04/29/2023   HDL 102 04/29/2023   LDLCALC 106 (H) 04/29/2023   TRIG 68 04/29/2023   CHOLHDL 2.2 04/29/2023     Radiology Reports [place] EKG SITE RITE  Result Date: 05/14/2023 If Site Rite image not attached, placement could not be confirmed due to current cardiac rhythm.  DG Abd Portable 1V  Result Date: 05/13/2023 CLINICAL DATA:  [id number] SBO (small bowel obstruction) (HCC) [id number] EXAM: PORTABLE ABDOMEN - 1 VIEW COMPARISON:  05/12/23 Abdominal radiograph FINDINGS: Paucity of small bowel gas limits the ability to assess for obstruction. There is a dilated loop of bowel that projects over the lower pelvis. Persistent enteric contrast material projects over the pelvis. Enteric tube with side hole above the level of the GE junction, recommend advancement lung bases are clear. No acute osseous abnormality IMPRESSION: 1. Overall paucity of small bowel gas limits the ability to assess for obstruction. There is a dilated loop of bowel that projects over the lower pelvis. 2. Enteric tube with side hole above the level of the GE junction, recommend advancement. Electronically Signed   By: Lorenza Cambridge M.D.   On: 05/13/2023 08:41   DG Abd Portable 1V  Result Date: 05/12/2023 CLINICAL DATA:  Abdominal pain, follow up small bowel obstruction EXAM: PORTABLE ABDOMEN - 1 VIEW COMPARISON:  05/11/2023 FINDINGS: Gastric catheter is again noted within the stomach. Scattered large and small bowel gas is noted. Previously seen small bowel dilatation has resolved. Contrast is noted within the colon. No free air is seen. Postsurgical changes are again noted. IMPRESSION: Previously seen small bowel dilatation has resolved. Electronically Signed   By: Alcide Clever M.D.   On: 05/12/2023 11:18      Signature  -   Susa Raring M.D on 05/16/2023 at 8:13 AM   -  To page go to  [email or web address]

## 2023-05-16 NOTE — Progress Notes (Signed)
PHARMACY - TOTAL PARENTERAL NUTRITION CONSULT NOTE  Indication: SBO  Patient Measurements: Height: 5\' 5"  (165.1 cm) Weight: 54.4 kg (119 lb 14.9 oz) IBW/kg (Calculated) : 57 TPN AdjBW (KG): 53 Body mass index is 19.96 kg/m.  Assessment:  64 YOF presented to [facility] on 7/8 with abdominal pain. PMH significant for calcium pyrophosphate deposition disease, IBS, multiple pSBO, tubular adenoma of colon and narcotic bowel syndrome.  Underwent ex-lap with LoA and SBR in 2019 and had SBO in 2020 and 2023.  No improvement with conservative management, so underwent ex-lap with LoA and repair of SB serosal tear on 7/12.  Patient has been NPO since admission 7/8 and expect to remain NPO. Patient received IVF with D5 7/9- 7/10am and 7/11-7/12 am, which reduces refeeding risk. Pharmacy consulted to manage TPN.  Glucose / Insulin: no hx DM, BG < 180, no insulin used   Electrolytes: K 3.4 (received ~14 mEq per IVF), Phos 1.9 (30 mmol Kphos ordered), CoCa 9.1, others WNL Renal: SCr 0.63, BUN WNL Hepatic: LFTs/Tbili wnl, albumin 3.1 Intake / Output; MIVF: D5 halfNS+10 mEq K/L @ 100 ml/hr x19 hours; UOP not charted, NG GI Imaging:   7/8 CT: dilated CB loop, possible early partial CBO  7/8 KUB SBO protocol- contrast in stomach and dilated loops of CB 7/9 KUB: persisting SBO, contrast in SB loop 7/10 KUB: scattered large and CB gas, contrast in colon  7/11 KUB:  paucity of SB gas limits the ability to assess for obstruction. There is a dilated loop of bowel. GI Surgeries / Procedures:  7/9 NGT placed 7/12 ex-lap with LoA and repair of SB serosal tear  Central access: PICC 05/15/23 TPN start date: 05/15/23  Nutritional Goals: RD Assessment: Estimated Needs Total Energy Estimated Needs: 1700-1900 Total Protein Estimated Needs: 90-105 grams Total Fluid Estimated Needs: 1.7-1.9 L  Goal TPN: Goal rate of 70 ml/hr provides 91g protein and 1724 kcals  Current Nutrition:  NPO + TPN 7/13 PM D5  halfNS+10 mEq K/L @ 100 ml/hr 7/14 D5+40 mEq K/L @ 50 ml/hr  Plan:   Advance TPN to goal 70 mL/hr at 1800, meeting 100% estimated needs  Electrolytes in TPN: Na 75 mEq/L, increase K 50 mEq/L, increase Ca 3 mEq/L, Mg 8 mEq/L, increase Phos 18 mmol/L. change Cl:Ac 2:1 Add standard MVI and trace elements to TPN Continue Sensitive q6h SSI and stop if not needing Stop D5+40 mEq K/L at 1800- ok by MD Monitor TPN labs daily until stable at goal then on Mon/Thurs   Alphia Moh, PharmD, BCPS, [facility] Clinical Pharmacist  Please check AMION for all [facility] Pharmacy phone numbers After 10:00 PM, call Main Pharmacy [phone]

## 2023-05-16 NOTE — Progress Notes (Signed)
2 Days Post-Op  Subjective: CC: Pain around incision. Pain medications are helping some. No nausea. Possibly some flatus but no bm. NGT bilious 350cc/24 hours Mobilizing in room. Up to the chair this am. Foley still in place.   Afebrile. No tachycardia or hypotension. WBC wnl. Cr wnl.   Objective: Vital signs in last 24 hours: Temp:  [98.1 F (36.7 C)-98.5 F (36.9 C)] 98.5 F (36.9 C) (07/14 0329) Pulse Rate:  [69-72] 70 (07/14 0800) Resp:  [16-20] 18 (07/14 0800) BP: (131-179)/(71-84) 179/79 (07/14 0800) SpO2:  [95 %-100 %] 99 % (07/14 0800) Weight:  [54.4 kg] 54.4 kg (07/14 0500) Last BM Date : 05/10/23  Intake/Output from previous day: 07/13 0701 - 07/14 0700 In: -  Out: 350 [Emesis/NG output:350] Intake/Output this shift: Total I/O In: 2255.1 [I.V.:2255.1] Out: 1450 [Urine:1450]  PE: Gen:  Alert, NAD, pleasant Abd: Soft, mild distension, appropriately tender around incision, no rigidity or guarding and otherwise NT. Hypoactive BS. Midline wound with honeycomb dressing in place - some dried blood on dressing but no signs of active bleeding. NGT bilious.   Lab Results:  Recent Labs    05/15/23 0530 05/16/23 0306  WBC 9.1 9.6  HGB 13.2 11.8*  HCT 39.6 35.4*  PLT 177 174   BMET Recent Labs    05/15/23 0530 05/16/23 0306  NA 140 140  K 3.9 3.4*  CL 103 103  CO2 28 31  GLUCOSE 103* 138*  BUN 18 13  CREATININE 0.75 0.63  CALCIUM 8.6* 8.4*   PT/INR No results for input(s): "LABPROT", "INR" in the last 72 hours. CMP     Component Value Date/Time   NA 140 05/16/2023 0306   NA 140 04/29/2023 0810   K 3.4 (L) 05/16/2023 0306   CL 103 05/16/2023 0306   CO2 31 05/16/2023 0306   GLUCOSE 138 (H) 05/16/2023 0306   BUN 13 05/16/2023 0306   BUN 17 04/29/2023 0810   CREATININE 0.63 05/16/2023 0306   CREATININE 1.05 (H) 03/26/2017 0929   CALCIUM 8.4 (L) 05/16/2023 0306   PROT 5.6 (L) 05/15/2023 0530   PROT 6.9 04/29/2023 0810   ALBUMIN 3.1 (L)  05/15/2023 0530   ALBUMIN 4.8 04/29/2023 0810   AST 24 05/15/2023 0530   ALT 17 05/15/2023 0530   ALKPHOS 46 05/15/2023 0530   BILITOT 0.5 05/15/2023 0530   BILITOT 0.3 04/29/2023 0810   GFRNONAA >60 05/16/2023 0306   GFRAA 83 10/18/2020 1227   Lipase     Component Value Date/Time   LIPASE 27 05/10/2023 0537    Studies/Results: [place] EKG SITE RITE  Result Date: 05/14/2023 If Site Rite image not attached, placement could not be confirmed due to current cardiac rhythm.   Anti-infectives: Anti-infectives (From admission, onward)    Start     Dose/Rate Route Frequency Ordered Stop   05/14/23 1230  cefTRIAXone (ROCEPHIN) 2 g in sodium chloride 0.9 % 100 mL IVPB       Note to Pharmacy: Pharmacy may adjust dosing strength / duration / interval for maximal efficacy   2 g 200 mL/hr over 30 Minutes Intravenous On call to O.R. 05/14/23 1218 05/15/23 0559   05/14/23 1230  metroNIDAZOLE (FLAGYL) IVPB 500 mg        500 mg 100 mL/hr over 60 Minutes Intravenous On call to O.R. 05/14/23 1218 05/15/23 0559   05/13/23 0915  cefTRIAXone (ROCEPHIN) 2 g in sodium chloride 0.9 % 100 mL IVPB  Note to Pharmacy: Pharmacy may adjust dosing strength / duration / interval for maximal efficacy   2 g 200 mL/hr over 30 Minutes Intravenous On call to O.R. 05/13/23 8295 05/14/23 0559   05/13/23 0915  metroNIDAZOLE (FLAGYL) IVPB 500 mg        500 mg 100 mL/hr over 60 Minutes Intravenous On call to O.R. 05/13/23 0823 05/14/23 0559        Assessment/Plan POD#2 - status post ex lap with lysis of adhesions - Dr. Luisa Hart, 05/14/2023             - Cont NPO/NGT to LIWS. AROBF. Possible clamping trial tomorrow.              - PICC/TPN             - OOB, ambulate  - Adjust pain meds  - D/c Foley       - Pulm toilet  FEN - NPO, NGT to LIWS, IVF per TRH. Replace K (3.4) and Phos (1.9) VTE - SCDs, okay for chem ppx from our standpoint ID - Peri-op, non currently Foley - d/c today Plan - As above.     LOS: 6 days    Jacinto Halim , [facility] 05/16/2023, 8:54 AM Please see Amion for pager number during day hours 7:00am-4:30pm

## 2023-05-16 NOTE — Plan of Care (Signed)

## 2023-05-17 DIAGNOSIS — K56609 Unspecified intestinal obstruction, unspecified as to partial versus complete obstruction: Secondary | ICD-10-CM | POA: Diagnosis not present

## 2023-05-17 LAB — CBC
HCT: 35.7 % — ABNORMAL LOW (ref 36.0–46.0)
Hemoglobin: 11.7 g/dL — ABNORMAL LOW (ref 12.0–15.0)
MCH: 32.8 pg (ref 26.0–34.0)
MCHC: 32.8 g/dL (ref 30.0–36.0)
MCV: 100 fL (ref 80.0–100.0)
Platelets: 176 10*3/uL (ref 150–400)
RBC: 3.57 MIL/uL — ABNORMAL LOW (ref 3.87–5.11)
RDW: 12.1 % (ref 11.5–15.5)
WBC: 12.1 10*3/uL — ABNORMAL HIGH (ref 4.0–10.5)
nRBC: 0 % (ref 0.0–0.2)

## 2023-05-17 LAB — COMPREHENSIVE METABOLIC PANEL
ALT: 16 U/L (ref 0–44)
AST: 19 U/L (ref 15–41)
Albumin: 2.8 g/dL — ABNORMAL LOW (ref 3.5–5.0)
Alkaline Phosphatase: 46 U/L (ref 38–126)
Anion gap: 13 (ref 5–15)
BUN: 10 mg/dL (ref 8–23)
CO2: 28 mmol/L (ref 22–32)
Calcium: 8.8 mg/dL — ABNORMAL LOW (ref 8.9–10.3)
Chloride: 97 mmol/L — ABNORMAL LOW (ref 98–111)
Creatinine, Ser: 0.52 mg/dL (ref 0.44–1.00)
GFR, Estimated: >60 mL/min (ref >60)
Glucose, Bld: 130 mg/dL — ABNORMAL HIGH (ref 70–99)
Potassium: 3.8 mmol/L (ref 3.5–5.1)
Sodium: 138 mmol/L (ref 135–145)
Total Bilirubin: 0.3 mg/dL (ref 0.3–1.2)
Total Protein: 5.7 g/dL — ABNORMAL LOW (ref 6.5–8.1)

## 2023-05-17 LAB — GLUCOSE, CAPILLARY
Glucose-Capillary: 110 mg/dL — ABNORMAL HIGH (ref 70–99)
Glucose-Capillary: 123 mg/dL — ABNORMAL HIGH (ref 70–99)
Glucose-Capillary: 141 mg/dL — ABNORMAL HIGH (ref 70–99)
Glucose-Capillary: 86 mg/dL (ref 70–99)

## 2023-05-17 LAB — PHOSPHORUS: Phosphorus: 3.1 mg/dL (ref 2.5–4.6)

## 2023-05-17 LAB — MAGNESIUM: Magnesium: 1.9 mg/dL (ref 1.7–2.4)

## 2023-05-17 LAB — TRIGLYCERIDES: Triglycerides: 74 mg/dL (ref <150)

## 2023-05-17 MED ORDER — NALOXONE HCL 0.4 MG/ML IJ SOLN: 0.4000 mg | INTRAMUSCULAR | Status: DC | PRN

## 2023-05-17 MED ORDER — DIPHENHYDRAMINE HCL 50 MG/ML IJ SOLN: 12.5000 mg | Freq: Four times a day (QID) | INTRAMUSCULAR | Status: DC | PRN

## 2023-05-17 MED ORDER — METHOCARBAMOL 1000 MG/10ML IJ SOLN
1000.0000 mg | Freq: Three times a day (TID) | INTRAVENOUS | Status: DC
  Administered 2023-05-17 – 2023-05-19 (×5): 1000 mg via INTRAVENOUS
  Filled 2023-05-17 (×2): qty 10
  Filled 2023-05-17: qty 1000
  Filled 2023-05-17 (×3): qty 10
  Filled 2023-05-17: qty 1000
  Filled 2023-05-17: qty 10

## 2023-05-17 MED ORDER — PHENOL 1.4 % MT LIQD
1.0000 | OROMUCOSAL | Status: DC | PRN
  Administered 2023-05-17: 1 via OROMUCOSAL
  Filled 2023-05-17: qty 177

## 2023-05-17 MED ORDER — ACETAMINOPHEN 10 MG/ML IV SOLN
1000.0000 mg | Freq: Four times a day (QID) | INTRAVENOUS | Status: AC
  Administered 2023-05-17 – 2023-05-18 (×4): 1000 mg via INTRAVENOUS
  Filled 2023-05-17 (×4): qty 100

## 2023-05-17 MED ORDER — SODIUM CHLORIDE 0.9% FLUSH: 9.0000 mL | INTRAVENOUS | Status: DC | PRN

## 2023-05-17 MED ORDER — TRAVASOL 10 % IV SOLN
INTRAVENOUS | Status: AC
  Filled 2023-05-17: qty 907.2

## 2023-05-17 MED ORDER — DIPHENHYDRAMINE HCL 12.5 MG/5ML PO ELIX: 12.5000 mg | ORAL_SOLUTION | Freq: Four times a day (QID) | ORAL | Status: DC | PRN

## 2023-05-17 MED ORDER — ONDANSETRON HCL 4 MG/2ML IJ SOLN: 4.0000 mg | Freq: Four times a day (QID) | INTRAMUSCULAR | Status: DC | PRN

## 2023-05-17 MED ORDER — TRAVASOL 10 % IV SOLN: INTRAVENOUS | Status: DC

## 2023-05-17 NOTE — Progress Notes (Signed)
Mobility Specialist Progress Note:    05/17/23 1646  Mobility  Activity Ambulated with assistance in hallway  Level of Assistance Contact guard assist, steadying assist  Assistive Device Front wheel walker  Distance Ambulated (ft) 500 ft  Activity Response Tolerated well  Mobility Referral Yes  $Mobility charge 1 Mobility  Mobility Specialist Start Time (ACUTE ONLY) 1620  Mobility Specialist Stop Time (ACUTE ONLY) 1635  Mobility Specialist Time Calculation (min) (ACUTE ONLY) 15 min   Received pt in bed having no complaints and agreeable to mobility. Pt was asymptomatic throughout ambulation and returned to room w/o fault. Left in bed w/ call bell in reach and all needs met.   Thompson Grayer Mobility Specialist  Please contact vis Secure Chat or  Rehab Office [phone]

## 2023-05-17 NOTE — Progress Notes (Signed)
PHARMACY - TOTAL PARENTERAL NUTRITION CONSULT NOTE  Indication: SBO  Patient Measurements: Height: 5\' 5"  (165.1 cm) Weight: 54.2 kg (119 lb 7.8 oz) IBW/kg (Calculated) : 57 TPN AdjBW (KG): 53 Body mass index is 19.88 kg/m.  Assessment:  2 YOF presented to [facility] on 7/8 with abdominal pain. PMH significant for calcium pyrophosphate deposition disease, IBS, multiple pSBO, tubular adenoma of colon and narcotic bowel syndrome.  Underwent ex-lap with LoA and SBR in 2019 and had SBO in 2020 and 2023.  No improvement with conservative management, so underwent ex-lap with LoA and repair of SB serosal tear on 7/12.  Patient has been NPO since admission 7/8 and expect to remain NPO. Patient received IVF with D5 7/9- 7/10am and 7/11-7/12 am, which reduces refeeding risk. Pharmacy consulted to manage TPN.  Glucose / Insulin: no hx DM, BG < 180, used  2 units insulin Electrolytes: K  3.8 (Received 20 mEq in IVF and 45 mEq in Kphos), Cl 97, Phos 3.1 (received 30 mmol Kphos), Mg 1.9, CoCa 9.7, others WNL Renal: SCr 0.52, BUN WNL Hepatic: LFTs/Tbili/TG wnl, albumin 2.8 Intake / Output; MIVF: UOP 2.8 ml/kg/hr, NGT not charted GI Imaging:   7/8 CT: dilated CB loop, possible early partial CBO  7/8 KUB SBO protocol- contrast in stomach and dilated loops of CB 7/9 KUB: persisting SBO, contrast in SB loop 7/10 KUB: scattered large and CB gas, contrast in colon  7/11 KUB:  paucity of SB gas limits the ability to assess for obstruction. There is a dilated loop of bowel. GI Surgeries / Procedures:  7/9 NGT placed 7/12 ex-lap with LoA and repair of SB serosal tear  Central access: PICC 05/15/23 TPN start date: 05/15/23  Nutritional Goals: RD Assessment: Estimated Needs Total Energy Estimated Needs: 1700-1900 Total Protein Estimated Needs: 90-105 grams Total Fluid Estimated Needs: 1.7-1.9 L  Goal TPN: Goal rate of 70 ml/hr provides 91g protein and 1724 kcals  Current Nutrition:  NPO + TPN 7/13 PM  D5 halfNS+10 mEq K/L @ 100 ml/hr 7/14 D5+40 mEq K/L @ 50 ml/hr 7/15 Possible NG clamping trial   Plan:   Continue TPN at goal 70 mL/hr at 1800, meeting 100% estimated needs  Electrolytes in TPN: increase Na 100 mEq/L, increase K 60 mEq/L, Ca 3 mEq/L, increase Mg 12 mEq/L, Phos 18 mmol/L. change Cl:Ac max Cl Add standard MVI and trace elements to TPN Stop Sensitive q6h SSI and BG checks  Monitor TPN labs daily until stable at goal then on Mon/Thurs   Alphia Moh, PharmD, BCPS, [facility] Clinical Pharmacist  Please check AMION for all [facility] Pharmacy phone numbers After 10:00 PM, call Main Pharmacy [phone]

## 2023-05-17 NOTE — Care Management Important Message (Signed)
Important Message  Patient Details  Name: Bethany [name] MRN: [id number] Date of Birth: 02/08/1956   Medicare Important Message Given:  Yes     Sherilyn Banker 05/17/2023, 4:21 PM

## 2023-05-17 NOTE — Progress Notes (Cosign Needed Addendum)
3 Days Post-Op  Subjective: CC: Pain around incision. She is not sure if pain medication adjustments made yesterday helped at all. Felt bloated yesterday but that has resolved this morning. NGT has been clamped since ~7am and she has no nausea or vomiting. No output form NGT recorded over the last 24 hours. She is unsure if she has passed any flatus in the last 24 hours. No BM. Foley removed this morning (~1 hour ago). She has not voided since foley removal. Mobilized in the halls x 1 yesterday.   Afebrile. No tachycardia or hypotension. WBC 9.6 > 12.1. Cr wnl. K 3.8. Mg 1.9. Phos 3.1.  Objective: Vital signs in last 24 hours: Temp:  [97.9 F (36.6 C)-98.4 F (36.9 C)] 98.3 F (36.8 C) (07/15 0400) Pulse Rate:  [67-76] 68 (07/15 0400) Resp:  [17-18] 18 (07/15 0400) BP: (146-162)/(73-90) 162/81 (07/15 0400) SpO2:  [96 %-98 %] 96 % (07/15 0400) Weight:  [54.2 kg] 54.2 kg (07/15 0500) Last BM Date : 05/10/23  Intake/Output from previous day: 07/14 0701 - 07/15 0700 In: 3248.8 [I.V.:2811.1; IV Piggyback:437.7] Out: 3650 [Urine:3650] Intake/Output this shift: No intake/output data recorded.  PE: Gen:  Alert, NAD, pleasant Abd: Soft, mild distension, appropriately tender around incision, no rigidity or guarding and otherwise NT. Hypoactive BS. Midline wound cdi with staples in place. NGT clamped.    Lab Results:  Recent Labs    05/16/23 0306 05/17/23 0349  WBC 9.6 12.1*  HGB 11.8* 11.7*  HCT 35.4* 35.7*  PLT 174 176   BMET Recent Labs    05/16/23 0306 05/17/23 0349  NA 140 138  K 3.4* 3.8  CL 103 97*  CO2 31 28  GLUCOSE 138* 130*  BUN 13 10  CREATININE 0.63 0.52  CALCIUM 8.4* 8.8*   PT/INR No results for input(s): "LABPROT", "INR" in the last 72 hours. CMP     Component Value Date/Time   NA 138 05/17/2023 0349   NA 140 04/29/2023 0810   K 3.8 05/17/2023 0349   CL 97 (L) 05/17/2023 0349   CO2 28 05/17/2023 0349   GLUCOSE 130 (H) 05/17/2023 0349   BUN  10 05/17/2023 0349   BUN 17 04/29/2023 0810   CREATININE 0.52 05/17/2023 0349   CREATININE 1.05 (H) 03/26/2017 0929   CALCIUM 8.8 (L) 05/17/2023 0349   PROT 5.7 (L) 05/17/2023 0349   PROT 6.9 04/29/2023 0810   ALBUMIN 2.8 (L) 05/17/2023 0349   ALBUMIN 4.8 04/29/2023 0810   AST 19 05/17/2023 0349   ALT 16 05/17/2023 0349   ALKPHOS 46 05/17/2023 0349   BILITOT 0.3 05/17/2023 0349   BILITOT 0.3 04/29/2023 0810   GFRNONAA >60 05/17/2023 0349   GFRAA 83 10/18/2020 1227   Lipase     Component Value Date/Time   LIPASE 27 05/10/2023 0537    Studies/Results: No results found.  Anti-infectives: Anti-infectives (From admission, onward)    Start     Dose/Rate Route Frequency Ordered Stop   05/14/23 1230  cefTRIAXone (ROCEPHIN) 2 g in sodium chloride 0.9 % 100 mL IVPB       Note to Pharmacy: Pharmacy may adjust dosing strength / duration / interval for maximal efficacy   2 g 200 mL/hr over 30 Minutes Intravenous On call to O.R. 05/14/23 1218 05/15/23 0559   05/14/23 1230  metroNIDAZOLE (FLAGYL) IVPB 500 mg        500 mg 100 mL/hr over 60 Minutes Intravenous On call to O.R. 05/14/23 1218 05/15/23 0559  05/13/23 0915  cefTRIAXone (ROCEPHIN) 2 g in sodium chloride 0.9 % 100 mL IVPB       Note to Pharmacy: Pharmacy may adjust dosing strength / duration / interval for maximal efficacy   2 g 200 mL/hr over 30 Minutes Intravenous On call to O.R. 05/13/23 1610 05/14/23 0559   05/13/23 0915  metroNIDAZOLE (FLAGYL) IVPB 500 mg        500 mg 100 mL/hr over 60 Minutes Intravenous On call to O.R. 05/13/23 0823 05/14/23 0559        Assessment/Plan POD#3 - status post ex lap with lysis of adhesions - Dr. Luisa Hart, 05/14/2023             - Cont NGT clamped. Allows sips/chips              - PICC/TPN             - OOB, ambulate       - Pulm toilet  FEN - NGT clamped. Sips/chips. Make npo if any n/v. IVF per TRH.  VTE - SCDs, okay for chem ppx from our standpoint ID - Peri-op, non  currently Foley - out. TOV Plan - As above.    LOS: 7 days    Jacinto Halim , [facility] 05/17/2023, 11:44 AM Please see Amion for pager number during day hours 7:00am-4:30pm

## 2023-05-17 NOTE — Progress Notes (Signed)
PROGRESS NOTE                                                                                                                                                                                                             Patient Demographics:    Bethany [name], is a 67 y.o. female, DOB - 1956-03-05, WUJ:[id number]  Outpatient Primary MD for the patient is Joselyn Arrow, MD    LOS - 7  Admit date - 05/10/2023    Chief Complaint  Patient presents with   Abdominal Pain       Brief Narrative (HPI from H&P)   67 year old with history of HLD, CPPD, alcohol/opiate abuse and remission admitted for abdominal pain.  Workup showed SBO underwent surgical correction on 05/14/2023.   Subjective:   Patient in bed, appears comfortable, denies any headache, no fever, no chest pain or pressure, no shortness of breath , no abdominal pain. No new focal weakness, passing flatus.   Assessment  & Plan :    SBO.  Failed conservative management seen by general surgery underwent surgical correction by Dr. Luisa Hart on 05/14/2023, still has NG tube, currently no bowel activity, has have some bowel sounds today, PICC line with TNA started on 05/15/2023 per general surgery, defer further management of this issue to general surgery.  Acute toxic encephalopathy caused by an excessive use of narcotics in the hospital, resolved.  Monitor  Dyslipidemia, mood disorder.  Resume home medications once taking p.o.  Anterior basement membrane dystrophy of eyes.  Continue home eyedrops  Hypokalemia and hypophosphatemia.  Replaced.       Condition - Fair  Family Communication  : Bedside 05/15/2023  Code Status : DNR  Consults  : General surgery  PUD Prophylaxis : PPI   Procedures  :     Laparotomy by Dr. Luisa Hart general surgery on 05/14/2023 for SBO      Disposition Plan  :    Status is: Inpatient  DVT Prophylaxis  :    SCDs Start: 05/10/23 1058     Lab Results  Component Value Date   PLT 176 05/17/2023    Diet :  Diet Order             Diet NPO time specified Except for: Other (See Comments), Ice Chips  Diet effective now  Inpatient Medications  Scheduled Meds:  Chlorhexidine Gluconate Cloth  6 each Topical Daily   gabapentin  1,200 mg Oral QHS   gabapentin  600 mg Oral Daily   insulin aspart  0-9 Units Subcutaneous Q6H   lamoTRIgine  100 mg Oral BID   pantoprazole (PROTONIX) IV  40 mg Intravenous Q24H   prednisoLONE acetate  1 drop Both Eyes QID   simvastatin  20 mg Oral q1800   traZODone  50 mg Oral QHS   Continuous Infusions:  acetaminophen 1,000 mg (05/17/23 0714)   methocarbamol (ROBAXIN) IV     TPN ADULT (ION) 70 mL/hr at 05/16/23 1741   PRN Meds:.diphenhydrAMINE **OR** diphenhydrAMINE, guaiFENesin, hydrALAZINE, HYDROmorphone (DILAUDID) injection, ipratropium-albuterol, LORazepam, methocarbamol (ROBAXIN) IV, metoprolol tartrate, naloxone **AND** sodium chloride flush, naLOXone (NARCAN)  injection, ondansetron **OR** ondansetron (ZOFRAN) IV, ondansetron (ZOFRAN) IV, senna-docusate, sodium chloride flush, traZODone    Objective:   Vitals:   05/16/23 2103 05/17/23 0000 05/17/23 0400 05/17/23 0500  BP:  (!) 152/74 (!) 162/81   Pulse: 75 71 68   Resp:  18 18   Temp:  97.9 F (36.6 C) 98.3 F (36.8 C)   TempSrc:  Oral Oral   SpO2: 96% 96% 96%   Weight:    54.2 kg  Height:        Wt Readings from Last 3 Encounters:  05/17/23 54.2 kg  05/05/23 52.6 kg  04/28/23 52.2 kg     Intake/Output Summary (Last 24 hours) at 05/17/2023 0920 Last data filed at 05/17/2023 0555 Gross per 24 hour  Intake 993.74 ml  Output 2200 ml  Net -1206.26 ml     Physical Exam  Awake Alert, No new F.N deficits, NG Gillis.AT,PERRAL Supple Neck, No JVD,   Symmetrical Chest wall movement, Good air movement bilaterally, CTAB RRR,No Gallops,Rubs or new Murmurs,  hypoactive B.Sounds, Abd Soft, No  tenderness,   No Cyanosis, Clubbing or edema        Data Review:    Recent Labs  Lab 05/13/23 0724 05/14/23 0500 05/15/23 0530 05/16/23 0306 05/17/23 0349  WBC 13.5* 9.8 9.1 9.6 12.1*  HGB 13.1 12.4 13.2 11.8* 11.7*  HCT 39.4 36.8 39.6 35.4* 35.7*  PLT 158 155 177 174 176  MCV 102.3* 101.7* 99.2 102.9* 100.0  MCH 34.0 34.3* 33.1 34.3* 32.8  MCHC 33.2 33.7 33.3 33.3 32.8  RDW 12.4 12.4 12.2 12.3 12.1    Recent Labs  Lab 05/13/23 0724 05/14/23 0500 05/15/23 0530 05/16/23 0306 05/17/23 0349  NA 143 139 140 140 138  K 3.2* 3.2* 3.9 3.4* 3.8  CL 102 103 103 103 97*  CO2 30 29 28 31 28   ANIONGAP 11 7 9 6 13   GLUCOSE 98 122* 103* 138* 130*  BUN 19 19 18 13 10   CREATININE 0.75 0.60 0.75 0.63 0.52  AST  --   --  24  --  19  ALT  --   --  17  --  16  ALKPHOS  --   --  46  --  46  BILITOT  --   --  0.5  --  0.3  ALBUMIN  --   --  3.1*  --  2.8*  MG 2.1 2.0 1.9 2.0 1.9  CALCIUM 9.3 8.7* 8.6* 8.4* 8.8*      Recent Labs  Lab 05/13/23 0724 05/14/23 0500 05/15/23 0530 05/16/23 0306 05/17/23 0349  MG 2.1 2.0 1.9 2.0 1.9  CALCIUM 9.3 8.7* 8.6* 8.4* 8.8*    Recent  Labs  Lab 05/13/23 0724 05/14/23 0500 05/15/23 0530 05/16/23 0306 05/17/23 0349  WBC 13.5* 9.8 9.1 9.6 12.1*  PLT 158 155 177 174 176  CREATININE 0.75 0.60 0.75 0.63 0.52    ------------------------------------------------------------------------------------------------------------------ Lab Results  Component Value Date   CHOL 220 (H) 04/29/2023   HDL 102 04/29/2023   LDLCALC 106 (H) 04/29/2023   TRIG 74 05/17/2023   CHOLHDL 2.2 04/29/2023     Radiology Reports [place] EKG SITE RITE  Result Date: 05/14/2023 If Site Rite image not attached, placement could not be confirmed due to current cardiac rhythm.     Signature  -   Susa Raring M.D on 05/17/2023 at 9:20 AM   -  To page go to [email or web address]

## 2023-05-18 DIAGNOSIS — K56609 Unspecified intestinal obstruction, unspecified as to partial versus complete obstruction: Secondary | ICD-10-CM | POA: Diagnosis not present

## 2023-05-18 LAB — COMPREHENSIVE METABOLIC PANEL
ALT: 16 U/L (ref 0–44)
AST: 17 U/L (ref 15–41)
Albumin: 2.7 g/dL — ABNORMAL LOW (ref 3.5–5.0)
Alkaline Phosphatase: 56 U/L (ref 38–126)
Anion gap: 7 (ref 5–15)
BUN: 12 mg/dL (ref 8–23)
CO2: 28 mmol/L (ref 22–32)
Calcium: 8.7 mg/dL — ABNORMAL LOW (ref 8.9–10.3)
Chloride: 102 mmol/L (ref 98–111)
Creatinine, Ser: 0.53 mg/dL (ref 0.44–1.00)
GFR, Estimated: >60 mL/min (ref >60)
Glucose, Bld: 114 mg/dL — ABNORMAL HIGH (ref 70–99)
Potassium: 4 mmol/L (ref 3.5–5.1)
Sodium: 137 mmol/L (ref 135–145)
Total Bilirubin: 0.4 mg/dL (ref 0.3–1.2)
Total Protein: 5.9 g/dL — ABNORMAL LOW (ref 6.5–8.1)

## 2023-05-18 LAB — CBC
HCT: 36 % (ref 36.0–46.0)
Hemoglobin: 12.2 g/dL (ref 12.0–15.0)
MCH: 34.4 pg — ABNORMAL HIGH (ref 26.0–34.0)
MCHC: 33.9 g/dL (ref 30.0–36.0)
MCV: 101.4 fL — ABNORMAL HIGH (ref 80.0–100.0)
Platelets: 195 10*3/uL (ref 150–400)
RBC: 3.55 MIL/uL — ABNORMAL LOW (ref 3.87–5.11)
RDW: 12.5 % (ref 11.5–15.5)
WBC: 9.3 10*3/uL (ref 4.0–10.5)
nRBC: 0 % (ref 0.0–0.2)

## 2023-05-18 LAB — PHOSPHORUS: Phosphorus: 3.3 mg/dL (ref 2.5–4.6)

## 2023-05-18 LAB — MAGNESIUM: Magnesium: 2.1 mg/dL (ref 1.7–2.4)

## 2023-05-18 MED ORDER — TRAVASOL 10 % IV SOLN
INTRAVENOUS | Status: AC
  Filled 2023-05-18: qty 907.2

## 2023-05-18 NOTE — Progress Notes (Signed)
4 Days Post-Op   Subjective/Chief Complaint: Tolerated NG clamped all night without any nausea or bloating Passing a small amount of flatus   Objective: Vital signs in last 24 hours: Temp:  [97.2 F (36.2 C)-98.6 F (37 C)] 98.6 F (37 C) (07/16 0905) Pulse Rate:  [66-84] 84 (07/16 0905) Resp:  [18] 18 (07/15 1200) BP: (143-187)/(72-115) 143/91 (07/16 0905) SpO2:  [96 %-100 %] 100 % (07/16 0905) Last BM Date : 05/10/23  Intake/Output from previous day: No intake/output data recorded. Intake/Output this shift: No intake/output data recorded.  Exam: Awake and alert Comfortable Abdomen soft, dressing intact  Lab Results:  Recent Labs    05/17/23 0349 05/18/23 0354  WBC 12.1* 9.3  HGB 11.7* 12.2  HCT 35.7* 36.0  PLT 176 195   BMET Recent Labs    05/17/23 0349 05/18/23 0354  NA 138 137  K 3.8 4.0  CL 97* 102  CO2 28 28  GLUCOSE 130* 114*  BUN 10 12  CREATININE 0.52 0.53  CALCIUM 8.8* 8.7*   PT/INR No results for input(s): "LABPROT", "INR" in the last 72 hours. ABG No results for input(s): "PHART", "HCO3" in the last 72 hours.  Invalid input(s): "PCO2", "PO2"  Studies/Results: No results found.  Anti-infectives: Anti-infectives (From admission, onward)    Start     Dose/Rate Route Frequency Ordered Stop   05/14/23 1230  cefTRIAXone (ROCEPHIN) 2 g in sodium chloride 0.9 % 100 mL IVPB       Note to Pharmacy: Pharmacy may adjust dosing strength / duration / interval for maximal efficacy   2 g 200 mL/hr over 30 Minutes Intravenous On call to O.R. 05/14/23 1218 05/15/23 0559   05/14/23 1230  metroNIDAZOLE (FLAGYL) IVPB 500 mg        500 mg 100 mL/hr over 60 Minutes Intravenous On call to O.R. 05/14/23 1218 05/15/23 0559   05/13/23 0915  cefTRIAXone (ROCEPHIN) 2 g in sodium chloride 0.9 % 100 mL IVPB       Note to Pharmacy: Pharmacy may adjust dosing strength / duration / interval for maximal efficacy   2 g 200 mL/hr over 30 Minutes Intravenous On call  to O.R. 05/13/23 0981 05/14/23 0559   05/13/23 0915  metroNIDAZOLE (FLAGYL) IVPB 500 mg        500 mg 100 mL/hr over 60 Minutes Intravenous On call to O.R. 05/13/23 0823 05/14/23 0559       Assessment/Plan: POD#4- status post ex lap with lysis of adhesions - Dr. Luisa Hart, 05/14/2023   -D/C NG -start clear liquids  Abigail Miyamoto MD 05/18/2023

## 2023-05-18 NOTE — Progress Notes (Addendum)
PHARMACY - TOTAL PARENTERAL NUTRITION CONSULT NOTE  Indication: SBO  Patient Measurements: Height: 5\' 5"  (165.1 cm) Weight: 54.2 kg (119 lb 7.8 oz) IBW/kg (Calculated) : 57 TPN AdjBW (KG): 53 Body mass index is 19.88 kg/m.  Assessment:  38 YOF presented to [facility] on 7/8 with abdominal pain. PMH significant for calcium pyrophosphate deposition disease, IBS, multiple pSBO, tubular adenoma of colon and narcotic bowel syndrome.  Underwent ex-lap with LoA and SBR in 2019 and had SBO in 2020 and 2023.  No improvement with conservative management, so underwent ex-lap with LoA and repair of SB serosal tear on 7/12.  Patient has been NPO since admission 7/8 and expect to remain NPO. Patient received IVF with D5 7/9- 7/10am and 7/11-7/12 am, which reduces refeeding risk. Pharmacy consulted to manage TPN.  Glucose / Insulin: no hx DM, BG < 180, SSI and CBG checks stopped 7/15 Electrolytes: K 4 (s/p incr in TPN), Cl 102, CO2 28 (s/p change to max Cl), Phos 3.3, Mg 2.1 (s/p incr in TPN) Renal: SCr 0.53, BUN WNL - stable Hepatic: LFTs/Tbili/TG wnl, albumin 2.7 Intake / Output; MIVF: No UOP charted yesterday, no NGT output (noted to have had minimal output while NGT on LIWS per [facility] note) GI Imaging:   7/8 CT: dilated CB loop, possible early partial CBO  7/8 KUB SBO protocol- contrast in stomach and dilated loops of CB 7/9 KUB: persisting SBO, contrast in SB loop 7/10 KUB: scattered large and CB gas, contrast in colon  7/11 KUB:  paucity of SB gas limits the ability to assess for obstruction. There is a dilated loop of bowel. GI Surgeries / Procedures:  7/9 NGT placed 7/12 ex-lap with LoA and repair of SB serosal tear  Central access: PICC 05/15/23 TPN start date: 05/15/23  Nutritional Goals: RD Assessment: Estimated Needs Total Energy Estimated Needs: 1700-1900 Total Protein Estimated Needs: 90-105 grams Total Fluid Estimated Needs: 1.7-1.9 L  Goal TPN: Goal rate of 70 ml/hr provides 91g  protein and 1724 kcals  Current Nutrition:  NPO + TPN 7/13 PM D5 halfNS+10 mEq K/L @ 100 ml/hr 7/14 D5+40 mEq K/L @ 50 ml/hr 7/15 NGT clamped, sips of clears 7/16 tentative plan to remove NGT  Plan:   Continue TPN at goal 70 mL/hr at 1800, meeting 100% estimated needs  Electrolytes in TPN, no changes today: Na 100 mEq/L, K 60 mEq/L, Ca 3 mEq/L, Mg 12 mEq/L, Phos 18 mmol/L. Cl:Ac max Cl  Add standard MVI and trace elements to TPN Stop Sensitive q6h SSI and BG checks  Monitor TPN labs daily until stable at goal then on Mon/Thurs F/u ability to advance diet    Rexford Maus, PharmD, BCPS 05/18/2023 8:38 AM

## 2023-05-18 NOTE — Progress Notes (Signed)
Physical Therapy Treatment Patient Details Name: Bethany [name] MRN: [id number] DOB: 12-May-1956 Today's Date: 05/18/2023   History of Present Illness Pt is a 67 y.o. female presenting with abdominal pain.  w/u revealed SBO and pt underwent exp lap with lysis of adhesions and repair of small bowel tear. PMH significant of HLD, CPPD, and h/o ETOH/opiate abuse in remission, h/o SBO x 4    PT Comments  Pt progressing well with mobility. No longer needs walker and will be ready from PT standpoint whenever medically ready.      Assistance Recommended at Discharge None  If plan is discharge home, recommend the following:  Can travel by private vehicle           Equipment Recommendations  None recommended by PT    Recommendations for Other Services       Precautions / Restrictions Precautions Precautions: None     Mobility  Bed Mobility               General bed mobility comments: Pt up in chair    Transfers Overall transfer level: Needs assistance Equipment used: None Transfers: Sit to/from Stand Sit to Stand: Supervision                Ambulation/Gait Ambulation/Gait assistance: Supervision Gait Distance (Feet): 350 Feet Assistive device: None Gait Pattern/deviations: Step-through pattern, Decreased stride length Gait velocity: decr Gait velocity interpretation: 1.31 - 2.62 ft/sec, indicative of limited community ambulator   General Gait Details: supervision for safety and lines   Stairs             Wheelchair Mobility     Tilt Bed    Modified Rankin (Stroke Patients Only)       Balance Overall balance assessment: Mild deficits observed, not formally tested                                          Cognition Arousal/Alertness: Awake/alert Behavior During Therapy: WFL for tasks assessed/performed Overall Cognitive Status: Within Functional Limits for tasks assessed                                           Exercises      General Comments        Pertinent Vitals/Pain Pain Assessment Pain Assessment: Faces Faces Pain Scale: Hurts little more Pain Location: abdomen Pain Descriptors / Indicators: Grimacing Pain Intervention(s): Limited activity within patient's tolerance, Monitored during session    Home Living                          Prior Function            PT Goals (current goals can now be found in the care plan section) Progress towards PT goals: Progressing toward goals    Frequency    Min 1X/week      PT Plan Current plan remains appropriate    Co-evaluation              AM-PAC PT "6 Clicks" Mobility   Outcome Measure  Help needed turning from your back to your side while in a flat bed without using bedrails?: None Help needed moving from lying on your back to sitting on the side of a flat  bed without using bedrails?: None Help needed moving to and from a bed to a chair (including a wheelchair)?: None Help needed standing up from a chair using your arms (e.g., wheelchair or bedside chair)?: None Help needed to walk in hospital room?: A Little Help needed climbing 3-5 steps with a railing? : A Little 6 Click Score: 22    End of Session   Activity Tolerance: Patient tolerated treatment well Patient left: in chair;with call bell/phone within reach   PT Visit Diagnosis: Other abnormalities of gait and mobility (R26.89)     Time: 0865-7846 PT Time Calculation (min) (ACUTE ONLY): 12 min  Charges:    $Gait Training: 8-22 mins PT General Charges $$ ACUTE PT VISIT: 1 Visit                     [facility] PT Acute Rehabilitation Services Office [phone]    Angelina Ok [facility] 05/18/2023, 2:48 PM

## 2023-05-18 NOTE — Progress Notes (Signed)
Mobility Specialist Progress Note   05/18/23 1728  Mobility  Activity Ambulated independently in hallway  Level of Assistance Contact guard assist, steadying assist  Assistive Device None  Distance Ambulated (ft) 390 ft  Activity Response Tolerated well  Mobility Referral Yes  $Mobility charge 1 Mobility  Mobility Specialist Start Time (ACUTE ONLY) 1700  Mobility Specialist Stop Time (ACUTE ONLY) 1728  Mobility Specialist Time Calculation (min) (ACUTE ONLY) 28 min   Received pt in bed having c/o pain in abdomen(7/10) but agreeableto mobility. Requesting to use BR prior to session and able to transfer self w/o fault but minG present for safety. Ambulated in hallway w/ a fast but steady gait. Returned back to room w/o fault. Left sitting EOB w/ all needs met.   Frederico Hamman Mobility Specialist Please contact via SecureChat or  Rehab office at [phone]

## 2023-05-18 NOTE — Progress Notes (Signed)
PROGRESS NOTE                                                                                                                                                                                                             Patient Demographics:    Bethany [name], is a 67 y.o. female, DOB - 08-Aug-1956, ZOX:[id number]  Outpatient Primary MD for the patient is Joselyn Arrow, MD    LOS - 8  Admit date - 05/10/2023    Chief Complaint  Patient presents with   Abdominal Pain       Brief Narrative (HPI from H&P)   67 year old with history of HLD, CPPD, alcohol/opiate abuse and remission admitted for abdominal pain.  Workup showed SBO underwent surgical correction on 05/14/2023.   Subjective:   Patient in bed, appears comfortable, denies any headache, no fever, no chest pain or pressure, no shortness of breath , no abdominal pain. No focal weakness.  Is passing some flatus but no BMs yet.  Tolerating ice chips.   Assessment  & Plan :    SBO.  Failed conservative management seen by general surgery underwent surgical correction by Dr. Luisa Hart on 05/14/2023, still has NG tube, currently no bowel activity, has have some bowel sounds today, PICC line with TNA started on 05/15/2023 per general surgery, defer further management of this issue to general surgery.  Acute toxic encephalopathy caused by an excessive use of narcotics in the hospital, resolved.  Monitor  Dyslipidemia, mood disorder.  Resume home medications once taking p.o.  Anterior basement membrane dystrophy of eyes.  Continue home eyedrops  Hypokalemia and hypophosphatemia.  Replaced.       Condition - Fair  Family Communication  : Bedside 05/15/2023  Code Status : DNR  Consults  : General surgery  PUD Prophylaxis : PPI   Procedures  :     Laparotomy by Dr. Luisa Hart general surgery on 05/14/2023 for SBO      Disposition Plan  :    Status is: Inpatient  DVT  Prophylaxis  :    SCDs Start: 05/10/23 1058    Lab Results  Component Value Date   PLT 195 05/18/2023    Diet :  Diet Order             Diet NPO time specified Except for: Ice Chips, Other (See Comments)  Diet effective now                    Inpatient Medications  Scheduled Meds:  Chlorhexidine Gluconate Cloth  6 each Topical Daily   gabapentin  1,200 mg Oral QHS   gabapentin  600 mg Oral Daily   lamoTRIgine  100 mg Oral BID   pantoprazole (PROTONIX) IV  40 mg Intravenous Q24H   prednisoLONE acetate  1 drop Both Eyes QID   simvastatin  20 mg Oral q1800   traZODone  50 mg Oral QHS   Continuous Infusions:  acetaminophen 1,000 mg (05/18/23 0413)   methocarbamol (ROBAXIN) IV 1,000 mg (05/18/23 8295)   TPN ADULT (ION) 70 mL/hr at 05/17/23 1805   TPN ADULT (ION)     PRN Meds:.diphenhydrAMINE **OR** diphenhydrAMINE, guaiFENesin, hydrALAZINE, HYDROmorphone (DILAUDID) injection, ipratropium-albuterol, LORazepam, metoprolol tartrate, naloxone **AND** sodium chloride flush, naLOXone (NARCAN)  injection, ondansetron **OR** ondansetron (ZOFRAN) IV, ondansetron (ZOFRAN) IV, phenol, senna-docusate, sodium chloride flush, traZODone    Objective:   Vitals:   05/17/23 2102 05/18/23 0025 05/18/23 0432 05/18/23 0905  BP: (!) 162/115 (!) 187/72 (!) 153/78 (!) 143/91  Pulse:  66 68 84  Resp:      Temp: (!) 97.3 F (36.3 C) (!) 97.2 F (36.2 C) 97.8 F (36.6 C) 98.6 F (37 C)  TempSrc: Oral Oral Oral Oral  SpO2:  100% 96% 100%  Weight:      Height:        Wt Readings from Last 3 Encounters:  05/17/23 54.2 kg  05/05/23 52.6 kg  04/28/23 52.2 kg    No intake or output data in the 24 hours ending 05/18/23 0925    Physical Exam  Awake Alert, No new F.N deficits, NG [place].AT,PERRAL Supple Neck, No JVD,   Symmetrical Chest wall movement, Good air movement bilaterally, CTAB RRR,No Gallops,Rubs or new Murmurs,  hypoactive B.Sounds, Abd Soft, No tenderness,   No  Cyanosis, Clubbing or edema        Data Review:    Recent Labs  Lab 05/14/23 0500 05/15/23 0530 05/16/23 0306 05/17/23 0349 05/18/23 0354  WBC 9.8 9.1 9.6 12.1* 9.3  HGB 12.4 13.2 11.8* 11.7* 12.2  HCT 36.8 39.6 35.4* 35.7* 36.0  PLT 155 177 174 176 195  MCV 101.7* 99.2 102.9* 100.0 101.4*  MCH 34.3* 33.1 34.3* 32.8 34.4*  MCHC 33.7 33.3 33.3 32.8 33.9  RDW 12.4 12.2 12.3 12.1 12.5    Recent Labs  Lab 05/14/23 0500 05/15/23 0530 05/16/23 0306 05/17/23 0349 05/18/23 0354  NA 139 140 140 138 137  K 3.2* 3.9 3.4* 3.8 4.0  CL 103 103 103 97* 102  CO2 29 28 31 28 28   ANIONGAP 7 9 6 13 7   GLUCOSE 122* 103* 138* 130* 114*  BUN 19 18 13 10 12   CREATININE 0.60 0.75 0.63 0.52 0.53  AST  --  24  --  19 17  ALT  --  17  --  16 16  ALKPHOS  --  46  --  46 56  BILITOT  --  0.5  --  0.3 0.4  ALBUMIN  --  3.1*  --  2.8* 2.7*  MG 2.0 1.9 2.0 1.9 2.1  CALCIUM 8.7* 8.6* 8.4* 8.8* 8.7*     Radiology Reports [place] EKG SITE RITE  Result Date: 05/14/2023 If Site Rite image not attached, placement could not be confirmed due to current cardiac rhythm.     Signature  -  Susa Raring M.D on 05/18/2023 at 9:25 AM   -  To page go to [email or web address]

## 2023-05-18 NOTE — Plan of Care (Signed)

## 2023-05-19 DIAGNOSIS — K56609 Unspecified intestinal obstruction, unspecified as to partial versus complete obstruction: Secondary | ICD-10-CM | POA: Diagnosis not present

## 2023-05-19 LAB — COMPREHENSIVE METABOLIC PANEL
ALT: 25 U/L (ref 0–44)
AST: 23 U/L (ref 15–41)
Albumin: 2.8 g/dL — ABNORMAL LOW (ref 3.5–5.0)
Alkaline Phosphatase: 65 U/L (ref 38–126)
Anion gap: 5 (ref 5–15)
BUN: 13 mg/dL (ref 8–23)
CO2: 27 mmol/L (ref 22–32)
Calcium: 8.5 mg/dL — ABNORMAL LOW (ref 8.9–10.3)
Chloride: 103 mmol/L (ref 98–111)
Creatinine, Ser: 0.49 mg/dL (ref 0.44–1.00)
GFR, Estimated: >60 mL/min (ref >60)
Glucose, Bld: 121 mg/dL — ABNORMAL HIGH (ref 70–99)
Potassium: 4.3 mmol/L (ref 3.5–5.1)
Sodium: 135 mmol/L (ref 135–145)
Total Bilirubin: 0.5 mg/dL (ref 0.3–1.2)
Total Protein: 6 g/dL — ABNORMAL LOW (ref 6.5–8.1)

## 2023-05-19 LAB — PHOSPHORUS: Phosphorus: 3.7 mg/dL (ref 2.5–4.6)

## 2023-05-19 LAB — MAGNESIUM: Magnesium: 2 mg/dL (ref 1.7–2.4)

## 2023-05-19 MED ORDER — LAMOTRIGINE 25 MG PO TABS
25.0000 mg | ORAL_TABLET | Freq: Every day | ORAL | Status: DC
  Administered 2023-05-19 – 2023-05-24 (×6): 25 mg via ORAL
  Filled 2023-05-19 (×6): qty 1

## 2023-05-19 MED ORDER — ACETAMINOPHEN 500 MG PO TABS
1000.0000 mg | ORAL_TABLET | Freq: Four times a day (QID) | ORAL | Status: DC
  Administered 2023-05-19 – 2023-05-24 (×14): 1000 mg via ORAL
  Filled 2023-05-19 (×14): qty 2

## 2023-05-19 MED ORDER — HYDROMORPHONE HCL 1 MG/ML IJ SOLN: 0.5000 mg | INTRAMUSCULAR | Status: DC | PRN

## 2023-05-19 MED ORDER — SIMETHICONE 80 MG PO CHEW: 80.0000 mg | CHEWABLE_TABLET | Freq: Four times a day (QID) | ORAL | Status: DC | PRN

## 2023-05-19 MED ORDER — OXYCODONE HCL 5 MG PO TABS
5.0000 mg | ORAL_TABLET | ORAL | Status: DC | PRN
  Administered 2023-05-19 – 2023-05-20 (×6): 10 mg via ORAL
  Administered 2023-05-20: 5 mg via ORAL
  Administered 2023-05-21 – 2023-05-24 (×14): 10 mg via ORAL
  Filled 2023-05-19 (×9): qty 2
  Filled 2023-05-19: qty 1
  Filled 2023-05-19 (×11): qty 2
  Filled 2023-05-19: qty 1

## 2023-05-19 MED ORDER — TRAVASOL 10 % IV SOLN
INTRAVENOUS | Status: AC
  Filled 2023-05-19: qty 907.2

## 2023-05-19 MED ORDER — METHOCARBAMOL 500 MG PO TABS
1000.0000 mg | ORAL_TABLET | Freq: Three times a day (TID) | ORAL | Status: DC | PRN
  Administered 2023-05-23 (×2): 1000 mg via ORAL
  Filled 2023-05-19 (×2): qty 2

## 2023-05-19 NOTE — Plan of Care (Signed)

## 2023-05-19 NOTE — Progress Notes (Signed)
5 Days Post-Op  Subjective: CC: Patient with more generalized pain today but still greatest around her incision. She feels bloated and is having burping/belching. Taking in cld without n/v. Passing some flatus. No BM. Voiding. Mobilized in the halls x 4 yesterday. Up in the chair this am.   Objective: Vital signs in last 24 hours: Temp:  [98 F (36.7 C)-98.7 F (37.1 C)] 98.1 F (36.7 C) (07/17 0428) Pulse Rate:  [67-72] 72 (07/17 0428) BP: (134-143)/(59-109) 141/74 (07/17 0428) SpO2:  [100 %] 100 % (07/17 0428) Last BM Date : 05/10/23  Intake/Output from previous day: 07/16 0701 - 07/17 0700 In: 1468 [I.V.:918; IV Piggyback:550] Out: -  Intake/Output this shift: No intake/output data recorded.  PE: Gen:  Alert, NAD, pleasant Abd: Soft, mild to moderate distension. Some generalized ttp, most around her incision. No rigidity or guarding. Some BS. Midline wound with some reactive appearing erythema near lower 1/3 of staples but is otherwise cdi with staples in place.   Lab Results:  Recent Labs    05/17/23 0349 05/18/23 0354  WBC 12.1* 9.3  HGB 11.7* 12.2  HCT 35.7* 36.0  PLT 176 195   BMET Recent Labs    05/18/23 0354 05/19/23 0533  NA 137 135  K 4.0 4.3  CL 102 103  CO2 28 27  GLUCOSE 114* 121*  BUN 12 13  CREATININE 0.53 0.49  CALCIUM 8.7* 8.5*   PT/INR No results for input(s): "LABPROT", "INR" in the last 72 hours. CMP     Component Value Date/Time   NA 135 05/19/2023 0533   NA 140 04/29/2023 0810   K 4.3 05/19/2023 0533   CL 103 05/19/2023 0533   CO2 27 05/19/2023 0533   GLUCOSE 121 (H) 05/19/2023 0533   BUN 13 05/19/2023 0533   BUN 17 04/29/2023 0810   CREATININE 0.49 05/19/2023 0533   CREATININE 1.05 (H) 03/26/2017 0929   CALCIUM 8.5 (L) 05/19/2023 0533   PROT 6.0 (L) 05/19/2023 0533   PROT 6.9 04/29/2023 0810   ALBUMIN 2.8 (L) 05/19/2023 0533   ALBUMIN 4.8 04/29/2023 0810   AST 23 05/19/2023 0533   ALT 25 05/19/2023 0533   ALKPHOS  65 05/19/2023 0533   BILITOT 0.5 05/19/2023 0533   BILITOT 0.3 04/29/2023 0810   GFRNONAA >60 05/19/2023 0533   GFRAA 83 10/18/2020 1227   Lipase     Component Value Date/Time   LIPASE 27 05/10/2023 0537    Studies/Results: No results found.  Anti-infectives: Anti-infectives (From admission, onward)    Start     Dose/Rate Route Frequency Ordered Stop   05/14/23 1230  cefTRIAXone (ROCEPHIN) 2 g in sodium chloride 0.9 % 100 mL IVPB       Note to Pharmacy: Pharmacy may adjust dosing strength / duration / interval for maximal efficacy   2 g 200 mL/hr over 30 Minutes Intravenous On call to O.R. 05/14/23 1218 05/15/23 0559   05/14/23 1230  metroNIDAZOLE (FLAGYL) IVPB 500 mg        500 mg 100 mL/hr over 60 Minutes Intravenous On call to O.R. 05/14/23 1218 05/15/23 0559   05/13/23 0915  cefTRIAXone (ROCEPHIN) 2 g in sodium chloride 0.9 % 100 mL IVPB       Note to Pharmacy: Pharmacy may adjust dosing strength / duration / interval for maximal efficacy   2 g 200 mL/hr over 30 Minutes Intravenous On call to O.R. 05/13/23 1027 05/14/23 0559   05/13/23 0915  metroNIDAZOLE (FLAGYL) IVPB  500 mg        500 mg 100 mL/hr over 60 Minutes Intravenous On call to O.R. 05/13/23 0823 05/14/23 0559        Assessment/Plan POD#5 - status post ex lap with lysis of adhesions - Dr. Luisa Hart, 05/14/2023 - Suspect she still is getting over an ileus. Cont CLD today - Cont PICC/TPN - Add PO meds - OOB, ambulate - Pulm toilet   FEN - CLD. TPN. IVF per TRH.  VTE - SCDs, okay for chem ppx from our standpoint ID - Peri-op, non currently Foley - out. Voiding Plan - As above.     LOS: 9 days    Jacinto Halim , [facility] 05/19/2023, 10:14 AM Please see Amion for pager number during day hours 7:00am-4:30pm

## 2023-05-19 NOTE — Progress Notes (Signed)
PROGRESS NOTE                                                                                                                                                                                                             Patient Demographics:    Bethany [name], is a 67 y.o. female, DOB - 05/19/1956, GMW:[id number]  Outpatient Primary MD for the patient is Joselyn Arrow, MD    LOS - 9  Admit date - 05/10/2023    Chief Complaint  Patient presents with   Abdominal Pain       Brief Narrative (HPI from H&P)   67 year old with history of HLD, CPPD, alcohol/opiate abuse and remission admitted for abdominal pain.  Workup showed SBO underwent surgical correction on 05/14/2023.   Subjective:   Patient in bed, appears comfortable, denies any headache, no fever, no chest pain or pressure, no shortness of breath , no abdominal pain. No focal weakness.  Some flatus but still no BM.   Assessment  & Plan :    SBO.  Failed conservative management seen by general surgery underwent surgical correction by Dr. Luisa Hart on 05/14/2023, has have some bowel sounds today, passing some flatus, NG tube removed by surgery on 05/18/2023, tolerating clear liquids since 05/18/2023, PICC line with TNA started on 05/15/2023 per general surgery, defer further management of this issue to general surgery.  Acute toxic encephalopathy caused by an excessive use of narcotics in the hospital, resolved.  Monitor  Dyslipidemia, mood disorder.  Resume home medications once taking p.o.  Anterior basement membrane dystrophy of eyes.  Continue home eyedrops  Hypokalemia and hypophosphatemia.  Replaced.       Condition - Fair  Family Communication  : Bedside 05/15/2023  Code Status : DNR  Consults  : General surgery  PUD Prophylaxis : PPI   Procedures  :     Laparotomy by Dr. Luisa Hart general surgery on 05/14/2023 for SBO      Disposition Plan  :    Status is:  Inpatient  DVT Prophylaxis  :    SCDs Start: 05/10/23 1058    Lab Results  Component Value Date   PLT 195 05/18/2023    Diet :  Diet Order             Diet clear liquid Fluid consistency: Thin  Diet effective now  Inpatient Medications  Scheduled Meds:  Chlorhexidine Gluconate Cloth  6 each Topical Daily   gabapentin  1,200 mg Oral QHS   gabapentin  600 mg Oral Daily   lamoTRIgine  25 mg Oral Daily   pantoprazole (PROTONIX) IV  40 mg Intravenous Q24H   prednisoLONE acetate  1 drop Both Eyes QID   simvastatin  20 mg Oral q1800   traZODone  50 mg Oral QHS   Continuous Infusions:  methocarbamol (ROBAXIN) IV 220 mL/hr at 05/19/23 0627   TPN ADULT (ION) 70 mL/hr at 05/19/23 0627   TPN ADULT (ION)     PRN Meds:.diphenhydrAMINE **OR** diphenhydrAMINE, guaiFENesin, hydrALAZINE, HYDROmorphone (DILAUDID) injection, ipratropium-albuterol, LORazepam, metoprolol tartrate, naloxone **AND** sodium chloride flush, naLOXone (NARCAN)  injection, ondansetron **OR** ondansetron (ZOFRAN) IV, ondansetron (ZOFRAN) IV, phenol, senna-docusate, sodium chloride flush, traZODone    Objective:   Vitals:   05/18/23 1708 05/18/23 1934 05/18/23 2336 05/19/23 0428  BP: (!) 136/109 (!) 139/59 134/78 (!) 141/74  Pulse: 69   72  Resp:      Temp: 98.4 F (36.9 C) 98.5 F (36.9 C) 98 F (36.7 C) 98.1 F (36.7 C)  TempSrc: Oral Oral Oral Oral  SpO2: 100%   100%  Weight:      Height:        Wt Readings from Last 3 Encounters:  05/17/23 54.2 kg  05/05/23 52.6 kg  04/28/23 52.2 kg     Intake/Output Summary (Last 24 hours) at 05/19/2023 1010 Last data filed at 05/19/2023 8119 Gross per 24 hour  Intake 1468.04 ml  Output --  Net 1468.04 ml      Physical Exam  Awake Alert, No new F.N deficits,   [place].AT,PERRAL Supple Neck, No JVD,   Symmetrical Chest wall movement, Good air movement bilaterally, CTAB RRR,No Gallops,Rubs or new Murmurs,  hypoactive B.Sounds, Abd  Soft, No tenderness,   No Cyanosis, Clubbing or edema        Data Review:    Recent Labs  Lab 05/14/23 0500 05/15/23 0530 05/16/23 0306 05/17/23 0349 05/18/23 0354  WBC 9.8 9.1 9.6 12.1* 9.3  HGB 12.4 13.2 11.8* 11.7* 12.2  HCT 36.8 39.6 35.4* 35.7* 36.0  PLT 155 177 174 176 195  MCV 101.7* 99.2 102.9* 100.0 101.4*  MCH 34.3* 33.1 34.3* 32.8 34.4*  MCHC 33.7 33.3 33.3 32.8 33.9  RDW 12.4 12.2 12.3 12.1 12.5    Recent Labs  Lab 05/15/23 0530 05/16/23 0306 05/17/23 0349 05/18/23 0354 05/19/23 0533  NA 140 140 138 137 135  K 3.9 3.4* 3.8 4.0 4.3  CL 103 103 97* 102 103  CO2 28 31 28 28 27   ANIONGAP 9 6 13 7 5   GLUCOSE 103* 138* 130* 114* 121*  BUN 18 13 10 12 13   CREATININE 0.75 0.63 0.52 0.53 0.49  AST 24  --  19 17 23   ALT 17  --  16 16 25   ALKPHOS 46  --  46 56 65  BILITOT 0.5  --  0.3 0.4 0.5  ALBUMIN 3.1*  --  2.8* 2.7* 2.8*  MG 1.9 2.0 1.9 2.1 2.0  CALCIUM 8.6* 8.4* 8.8* 8.7* 8.5*     Radiology Reports No results found.    Signature  -   Susa Raring M.D on 05/19/2023 at 10:10 AM   -  To page go to [email or web address]

## 2023-05-19 NOTE — Progress Notes (Signed)
PHARMACY - TOTAL PARENTERAL NUTRITION CONSULT NOTE  Indication: SBO  Patient Measurements: Height: 5\' 5"  (165.1 cm) Weight: 54.2 kg (119 lb 7.8 oz) IBW/kg (Calculated) : 57 TPN AdjBW (KG): 53 Body mass index is 19.88 kg/m.  Assessment:  36 YOF presented to [facility] on 7/8 with abdominal pain. PMH significant for calcium pyrophosphate deposition disease, IBS, multiple pSBO, tubular adenoma of colon and narcotic bowel syndrome.  Underwent ex-lap with LoA and SBR in 2019 and had SBO in 2020 and 2023.  No improvement with conservative management, so underwent ex-lap with LoA and repair of SB serosal tear on 7/12.  Patient has been NPO since admission 7/8 and expect to remain NPO. Patient received IVF with D5 7/9- 7/10am and 7/11-7/12 am, which reduces refeeding risk. Pharmacy consulted to manage TPN.  Glucose / Insulin: no hx DM, BG < 180, SSI and CBG checks stopped 7/15 Electrolytes: K 4.3 (s/p incr in TPN), Cl 103, CO2 27 (s/p change to max Cl), Phos 3.7, Mg 2 (s/p incr in TPN) Renal: Scr/BUN WNL and stable Hepatic: LFTs/Tbili/TG wnl, albumin 2.7 Intake / Output; MIVF: No UOP charted yesterday, NGT removed 7/16 AM; patient has tolerated juice, italian ice, water, and ice chips. No BM, passing flatus GI Imaging:   7/8 CT: dilated CB loop, possible early partial CBO  7/8 KUB SBO protocol- contrast in stomach and dilated loops of CB 7/9 KUB: persisting SBO, contrast in SB loop 7/10 KUB: scattered large and CB gas, contrast in colon  7/11 KUB:  paucity of SB gas limits the ability to assess for obstruction. There is a dilated loop of bowel. GI Surgeries / Procedures:  7/9 NGT placed 7/12 ex-lap with LoA and repair of SB serosal tear  Central access: PICC 05/15/23 TPN start date: 05/15/23  Nutritional Goals: RD Assessment: Estimated Needs Total Energy Estimated Needs: 1700-1900 Total Protein Estimated Needs: 90-105 grams Total Fluid Estimated Needs: 1.7-1.9 L  Goal TPN: Goal rate of  70 ml/hr provides 91g protein and 1724 kcals  Current Nutrition:  Clear liquids (started 7/16 AM) + TPN  Plan:   Continue TPN at goal 70 mL/hr at 1800, meeting 100% estimated needs  Electrolytes in TPN, no changes today: Na 100 mEq/L, K 60 mEq/L, Ca 3 mEq/L, Mg 12 mEq/L, Phos 18 mmol/L. Cl:Ac max Cl  Add standard MVI and trace elements to TPN Stop Sensitive q6h SSI and BG checks  Monitor TPN labs daily until stable at goal then on Mon/Thurs F/u ability to advance diet further   Rexford Maus, PharmD, BCPS 05/19/2023 8:13 AM

## 2023-05-20 DIAGNOSIS — K56609 Unspecified intestinal obstruction, unspecified as to partial versus complete obstruction: Secondary | ICD-10-CM | POA: Diagnosis not present

## 2023-05-20 LAB — MAGNESIUM: Magnesium: 2.1 mg/dL (ref 1.7–2.4)

## 2023-05-20 LAB — CBC
HCT: 33.4 % — ABNORMAL LOW (ref 36.0–46.0)
Hemoglobin: 10.6 g/dL — ABNORMAL LOW (ref 12.0–15.0)
MCH: 33 pg (ref 26.0–34.0)
MCHC: 31.7 g/dL (ref 30.0–36.0)
MCV: 104 fL — ABNORMAL HIGH (ref 80.0–100.0)
Platelets: 215 10*3/uL (ref 150–400)
RBC: 3.21 MIL/uL — ABNORMAL LOW (ref 3.87–5.11)
RDW: 12.8 % (ref 11.5–15.5)
WBC: 7 10*3/uL (ref 4.0–10.5)
nRBC: 0 % (ref 0.0–0.2)

## 2023-05-20 LAB — COMPREHENSIVE METABOLIC PANEL
ALT: 24 U/L (ref 0–44)
AST: 20 U/L (ref 15–41)
Albumin: 2.7 g/dL — ABNORMAL LOW (ref 3.5–5.0)
Alkaline Phosphatase: 66 U/L (ref 38–126)
Anion gap: 5 (ref 5–15)
BUN: 13 mg/dL (ref 8–23)
CO2: 28 mmol/L (ref 22–32)
Calcium: 8.4 mg/dL — ABNORMAL LOW (ref 8.9–10.3)
Chloride: 104 mmol/L (ref 98–111)
Creatinine, Ser: 0.55 mg/dL (ref 0.44–1.00)
GFR, Estimated: >60 mL/min (ref >60)
Glucose, Bld: 108 mg/dL — ABNORMAL HIGH (ref 70–99)
Potassium: 4 mmol/L (ref 3.5–5.1)
Sodium: 137 mmol/L (ref 135–145)
Total Bilirubin: 0.4 mg/dL (ref 0.3–1.2)
Total Protein: 5.6 g/dL — ABNORMAL LOW (ref 6.5–8.1)

## 2023-05-20 LAB — PHOSPHORUS: Phosphorus: 4.3 mg/dL (ref 2.5–4.6)

## 2023-05-20 MED ORDER — BISACODYL 10 MG RE SUPP
10.0000 mg | Freq: Every day | RECTAL | Status: DC | PRN
  Administered 2023-05-22: 10 mg via RECTAL
  Filled 2023-05-20 (×2): qty 1

## 2023-05-20 MED ORDER — POLYETHYLENE GLYCOL 3350 17 G PO PACK
17.0000 g | PACK | Freq: Every day | ORAL | Status: DC
  Administered 2023-05-20 – 2023-05-23 (×4): 17 g via ORAL
  Filled 2023-05-20 (×4): qty 1

## 2023-05-20 MED ORDER — TRAVASOL 10 % IV SOLN
INTRAVENOUS | Status: AC
  Filled 2023-05-20: qty 907.2

## 2023-05-20 NOTE — Plan of Care (Signed)

## 2023-05-20 NOTE — Progress Notes (Signed)
Physical Therapy Discharge Note Patient Details Name: Bethany [name] MRN: [id number] DOB: [birth year] Today's Date: 05/20/2023   History of Present Illness Pt is a 67 y.o. female presenting with abdominal pain.  w/u revealed SBO and pt underwent exp lap with lysis of adhesions and repair of small bowel tear. PMH significant of HLD, CPPD, and h/o ETOH/opiate abuse in remission, h/o SBO x 4    PT Comments  Pt has met all of her goals and is doing well with mobility. After discussing with pt she will be discharged from skilled physical therapy services in the acute care hospital setting. She will benefit from continued frequent mobility to return to [facility] and pt is aware. Pt will be discharged at this time and is independent with all functional mobility. No signs/symptoms of cardiac/respiratory distress throughout session.     Assistance Recommended at Discharge None  If plan is discharge home, recommend the following:  Can travel by private vehicle      None     Yes  Equipment Recommendations  None recommended by PT       Precautions / Restrictions Precautions Precautions: None Restrictions Weight Bearing Restrictions: No     Mobility  Bed Mobility Overal bed mobility: Independent Bed Mobility: Rolling, Sidelying to Sit, Sit to Sidelying Rolling: Independent Sidelying to sit: Independent     Sit to sidelying: Independent      Transfers Overall transfer level: Independent Equipment used: None Transfers: Sit to/from Stand Sit to Stand: Independent                Ambulation/Gait Ambulation/Gait assistance: Independent Gait Distance (Feet): 350 Feet Assistive device: IV Pole, None Gait Pattern/deviations: Step-through pattern, Decreased stride length Gait velocity: Decreased cadence. Gait velocity interpretation: 1.31 - 2.62 ft/sec, indicative of limited community ambulator   General Gait Details: Assist with lines and IV pole pt is  independent   Stairs Stairs:  (Not applicable.)             Balance Overall balance assessment: Mild deficits observed, not formally tested Sitting-balance support: No upper extremity supported, Feet supported Sitting balance-Leahy Scale: Good     Standing balance support: Bilateral upper extremity supported Standing balance-Leahy Scale: Fair Standing balance comment: Decreased UE support on RW no overt LOB        Cognition Arousal/Alertness: Awake/alert Behavior During Therapy: WFL for tasks assessed/performed Overall Cognitive Status: Within Functional Limits for tasks assessed           General Comments General comments (skin integrity, edema, etc.): pt is aware of safety precautions demonstrating very safe standing to sitting with use of UE and explanation for how and why he does this at home for safety.      Pertinent Vitals/Pain Pain Assessment Pain Assessment: 0-10 Pain Score: 6  Pain Location: abdomen Pain Descriptors / Indicators: Grimacing, Guarding Pain Intervention(s): Monitored during session, Patient requesting pain meds-RN notified     PT Goals (current goals can now be found in the care plan section) Progress towards PT goals: Goals met/education completed, patient discharged from PT    Frequency     DC from skilled PT services at this time.       PT Plan Current plan remains appropriate       AM-PAC PT "6 Clicks" Mobility   Outcome Measure  Help needed turning from your back to your side while in a flat bed without using bedrails?: None Help needed moving from lying on your back to sitting on  the side of a flat bed without using bedrails?: None Help needed moving to and from a bed to a chair (including a wheelchair)?: None Help needed standing up from a chair using your arms (e.g., wheelchair or bedside chair)?: None Help needed to walk in hospital room?: None Help needed climbing 3-5 steps with a railing? : A Little 6 Click Score:  23    End of Session Equipment Utilized During Treatment: Gait belt Activity Tolerance: Patient tolerated treatment well Patient left: with call bell/phone within reach;in bed Nurse Communication: Mobility status       Time: 1610-9604 PT Time Calculation (min) (ACUTE ONLY): 11 min  Charges:    $Gait Training: 8-22 mins PT General Charges $$ ACUTE PT VISIT: 1 Visit                    Harrel Carina, DPT, CLT  Acute Rehabilitation Services Office: [phone] (Secure chat preferred)    Claudia Desanctis 05/20/2023, 12:14 PM

## 2023-05-20 NOTE — Progress Notes (Signed)
PROGRESS NOTE                                                                                                                                                                                                             Patient Demographics:    Bethany [name], is a 67 y.o. female, DOB - 08/23/1956, WGN:[id number]  Outpatient Primary MD for the patient is Joselyn Arrow, MD    LOS - 10  Admit date - 05/10/2023    Chief Complaint  Patient presents with   Abdominal Pain       Brief Narrative (HPI from H&P)   67 year old with history of HLD, CPPD, alcohol/opiate abuse and remission admitted for abdominal pain.  Workup showed SBO underwent surgical correction on 05/14/2023.   Subjective:   Patient in bed, appears comfortable, denies any headache, no fever, no chest pain or pressure, no shortness of breath , no abdominal pain. No new focal weakness. Some flatus but still no BM.   Assessment  & Plan :   SBO.  Failed conservative management seen by general surgery underwent surgical correction by Dr. Luisa Hart on 05/14/2023, has have some bowel sounds today, passing some flatus, NG tube removed by surgery on 05/18/2023, tolerating clear liquids since 05/18/2023, PICC line with TNA started on 05/15/2023 per general surgery, defer further management of this issue to general surgery.  Acute toxic encephalopathy caused by an excessive use of narcotics in the hospital, resolved.  Monitor  Dyslipidemia, mood disorder.  Resume home medications once taking p.o.  Anterior basement membrane dystrophy of eyes.  Continue home eyedrops  Hypokalemia and hypophosphatemia.  Replaced.       Condition - Fair  Family Communication  : Bedside 05/15/2023  Code Status : DNR  Consults  : General surgery  PUD Prophylaxis : PPI   Procedures  :     Laparotomy by Dr. Luisa Hart general surgery on 05/14/2023 for SBO      Disposition Plan  :    Status is:  Inpatient  DVT Prophylaxis  :    SCDs Start: 05/10/23 1058    Lab Results  Component Value Date   PLT 215 05/20/2023    Diet :  Diet Order             Diet clear liquid Fluid consistency: Thin  Diet effective now  Inpatient Medications  Scheduled Meds:  acetaminophen  1,000 mg Oral Q6H   Chlorhexidine Gluconate Cloth  6 each Topical Daily   gabapentin  1,200 mg Oral QHS   gabapentin  600 mg Oral Daily   lamoTRIgine  25 mg Oral Daily   pantoprazole (PROTONIX) IV  40 mg Intravenous Q24H   prednisoLONE acetate  1 drop Both Eyes QID   simvastatin  20 mg Oral q1800   traZODone  50 mg Oral QHS   Continuous Infusions:  TPN ADULT (ION) 70 mL/hr at 05/19/23 1728   TPN ADULT (ION)     PRN Meds:.diphenhydrAMINE **OR** diphenhydrAMINE, guaiFENesin, hydrALAZINE, HYDROmorphone (DILAUDID) injection, ipratropium-albuterol, LORazepam, methocarbamol, metoprolol tartrate, naloxone **AND** sodium chloride flush, naLOXone (NARCAN)  injection, ondansetron **OR** ondansetron (ZOFRAN) IV, ondansetron (ZOFRAN) IV, oxyCODONE, phenol, senna-docusate, simethicone, sodium chloride flush, traZODone    Objective:   Vitals:   05/19/23 2300 05/20/23 0300 05/20/23 0500 05/20/23 0924  BP: 114/62 122/62  127/80  Pulse: 74 73  83  Resp: 16 16  12   Temp: 97.6 F (36.4 C) 97.7 F (36.5 C)  98.1 F (36.7 C)  TempSrc: Oral Axillary  Oral  SpO2: 98% 98%    Weight:   53.6 kg   Height:        Wt Readings from Last 3 Encounters:  05/20/23 53.6 kg  05/05/23 52.6 kg  04/28/23 52.2 kg     Intake/Output Summary (Last 24 hours) at 05/20/2023 1008 Last data filed at 05/20/2023 0500 Gross per 24 hour  Intake 480 ml  Output --  Net 480 ml      Physical Exam  Awake Alert, No new F.N deficits,   [place].AT,PERRAL Supple Neck, No JVD,   Symmetrical Chest wall movement, Good air movement bilaterally, CTAB RRR,No Gallops,Rubs or new Murmurs,  hypoactive B.Sounds, Abd Soft, No  tenderness,   No Cyanosis, Clubbing or edema        Data Review:    Recent Labs  Lab 05/15/23 0530 05/16/23 0306 05/17/23 0349 05/18/23 0354 05/20/23 0327  WBC 9.1 9.6 12.1* 9.3 7.0  HGB 13.2 11.8* 11.7* 12.2 10.6*  HCT 39.6 35.4* 35.7* 36.0 33.4*  PLT 177 174 176 195 215  MCV 99.2 102.9* 100.0 101.4* 104.0*  MCH 33.1 34.3* 32.8 34.4* 33.0  MCHC 33.3 33.3 32.8 33.9 31.7  RDW 12.2 12.3 12.1 12.5 12.8    Recent Labs  Lab 05/15/23 0530 05/16/23 0306 05/17/23 0349 05/18/23 0354 05/19/23 0533 05/20/23 0327  NA 140 140 138 137 135 137  K 3.9 3.4* 3.8 4.0 4.3 4.0  CL 103 103 97* 102 103 104  CO2 28 31 28 28 27 28   ANIONGAP 9 6 13 7 5 5   GLUCOSE 103* 138* 130* 114* 121* 108*  BUN 18 13 10 12 13 13   CREATININE 0.75 0.63 0.52 0.53 0.49 0.55  AST 24  --  19 17 23 20   ALT 17  --  16 16 25 24   ALKPHOS 46  --  46 56 65 66  BILITOT 0.5  --  0.3 0.4 0.5 0.4  ALBUMIN 3.1*  --  2.8* 2.7* 2.8* 2.7*  MG 1.9 2.0 1.9 2.1 2.0 2.1  CALCIUM 8.6* 8.4* 8.8* 8.7* 8.5* 8.4*     Radiology Reports No results found.    Signature  -   Susa Raring M.D on 05/20/2023 at 10:08 AM   -  To page go to [email or web address]

## 2023-05-20 NOTE — Progress Notes (Signed)
Nutrition Follow-up  DOCUMENTATION CODES:   Non-severe (moderate) malnutrition in context of chronic illness  INTERVENTION:  TPN per pharmacy Recommend continuing until pt is able to consistently meet >65% of estimated needs via enteral nutrition.  Ensure Enlive po BID, each supplement provides 350 kcal and 20 grams of protein. Diet education   NUTRITION DIAGNOSIS:   Moderate Malnutrition related to chronic illness as evidenced by moderate muscle depletion, mild fat depletion. - new diagnosis  GOAL:   Patient will meet greater than or equal to 90% of their needs - Being met via TPN  MONITOR:   PO intake, Supplement acceptance, Diet advancement, Labs, I & O's  REASON FOR ASSESSMENT:   Consult New TPN/TNA  ASSESSMENT:   Pt with medical history significant of HLD, CPPD, and h/o ETOH/opiate abuse in remission presenting with abdominal pain.  7/09 - NGT placed; KUB on this date reveals tube tip and side port overlying proximal stomach 7/12 - s/p Exploratory laparotomy with lysis of adhesions and repair of small bowel serosal tear x 3 with full-thickness repair x 1  7/13 - TPN started 7/16 - NGT removed; diet advanced to clear liquids 7/18 - diet advanced to full liquids    Met with pt in room. Reports that she has been tolerating diet well. Denies any nausea or vomiting, is having some abdominal pain. Had not had a BM at time of RD visit. Has been up walking the halls; RD encouraged pt to continue to do so. Shares that at home she does not have the best diet, likes fruits and nuts. Discussed following low fiber diet for ~6 weeks post op and then slowly incorporating fiber into diet to help keep regulated. Shares that she is very active and plays pickle ball or visits the gym.   Discussed adding oral nutrition supplements to increase PO intake, pt agreeable.  Pt agreeable to RD adding handouts to AVS.   Medications reviewed and include: Protonix, Miralax Labs  reviewed.  NUTRITION - FOCUSED PHYSICAL EXAM:  Flowsheet Row Most Recent Value  Orbital Region Mild depletion  Upper Arm Region Mild depletion  Thoracic and Lumbar Region No depletion  Buccal Region Mild depletion  Temple Region Severe depletion  Clavicle Bone Region Moderate depletion  Clavicle and Acromion Bone Region Moderate depletion  Scapular Bone Region Moderate depletion  Dorsal Hand Mild depletion  Patellar Region Unable to assess  Anterior Thigh Region Unable to assess  Posterior Calf Region No depletion  Edema (RD Assessment) None  Hair Unable to assess  Eyes Reviewed  Mouth Reviewed  Skin Reviewed  Nails Unable to assess   Diet Order:   Diet Order             Diet full liquid Fluid consistency: Thin  Diet effective now                   EDUCATION NEEDS:   Education needs have been addressed  Skin:  Skin Assessment: Skin Integrity Issues: Skin Integrity Issues:: Incisions Incisions: closed abdomen  Last BM:  7/19 - Type 4, small  Height:   Ht Readings from Last 1 Encounters:  05/14/23 5\' 5"  (1.651 m)    Weight:   Wt Readings from Last 1 Encounters:  05/20/23 53.6 kg    Ideal Body Weight:  56.8 kg  BMI:  Body mass index is 19.66 kg/m.  Estimated Nutritional Needs:   Kcal:  1700-1900  Protein:  90-105 grams  Fluid:  1.7-1.9 L   Rutger Salton  Clovis Riley RD, LDN Clinical Dietitian See Loretha Stapler for contact information.

## 2023-05-20 NOTE — Progress Notes (Signed)
6 Days Post-Op  Subjective: CC: Husband at bedside.  Abdomen feels tight but softer from yesterday pain better controlled with adjustments made yesterday. Tolerating cld without vomiting. Still some nausea but improved. Burping/belching improved. Passing flatus. No BM. Voiding. Mobilized in the halls   Objective: Vital signs in last 24 hours: Temp:  [97.6 F (36.4 C)-98.3 F (36.8 C)] 98.1 F (36.7 C) (07/18 0924) Pulse Rate:  [73-83] 83 (07/18 0924) Resp:  [12-20] 12 (07/18 0924) BP: (114-131)/(62-80) 127/80 (07/18 0924) SpO2:  [98 %-100 %] 98 % (07/18 0300) Weight:  [53.6 kg] 53.6 kg (07/18 0500) Last BM Date : 05/10/23  Intake/Output from previous day: 07/17 0701 - 07/18 0700 In: 480 [P.O.:480] Out: -  Intake/Output this shift: No intake/output data recorded.  PE: Gen:  Alert, NAD, pleasant Abd: Soft, less distension today. Some generalized ttp, most around her incision. No rigidity or guarding. Some BS. Midline wound with some reactive appearing erythema near lower 1/3 of staples but is otherwise cdi with staples in place - stable   Lab Results:  Recent Labs    05/18/23 0354 05/20/23 0327  WBC 9.3 7.0  HGB 12.2 10.6*  HCT 36.0 33.4*  PLT 195 215   BMET Recent Labs    05/19/23 0533 05/20/23 0327  NA 135 137  K 4.3 4.0  CL 103 104  CO2 27 28  GLUCOSE 121* 108*  BUN 13 13  CREATININE 0.49 0.55  CALCIUM 8.5* 8.4*   PT/INR No results for input(s): "LABPROT", "INR" in the last 72 hours. CMP     Component Value Date/Time   NA 137 05/20/2023 0327   NA 140 04/29/2023 0810   K 4.0 05/20/2023 0327   CL 104 05/20/2023 0327   CO2 28 05/20/2023 0327   GLUCOSE 108 (H) 05/20/2023 0327   BUN 13 05/20/2023 0327   BUN 17 04/29/2023 0810   CREATININE 0.55 05/20/2023 0327   CREATININE 1.05 (H) 03/26/2017 0929   CALCIUM 8.4 (L) 05/20/2023 0327   PROT 5.6 (L) 05/20/2023 0327   PROT 6.9 04/29/2023 0810   ALBUMIN 2.7 (L) 05/20/2023 0327   ALBUMIN 4.8  04/29/2023 0810   AST 20 05/20/2023 0327   ALT 24 05/20/2023 0327   ALKPHOS 66 05/20/2023 0327   BILITOT 0.4 05/20/2023 0327   BILITOT 0.3 04/29/2023 0810   GFRNONAA >60 05/20/2023 0327   GFRAA 83 10/18/2020 1227   Lipase     Component Value Date/Time   LIPASE 27 05/10/2023 0537    Studies/Results: No results found.  Anti-infectives: Anti-infectives (From admission, onward)    Start     Dose/Rate Route Frequency Ordered Stop   05/14/23 1230  cefTRIAXone (ROCEPHIN) 2 g in sodium chloride 0.9 % 100 mL IVPB       Note to Pharmacy: Pharmacy may adjust dosing strength / duration / interval for maximal efficacy   2 g 200 mL/hr over 30 Minutes Intravenous On call to O.R. 05/14/23 1218 05/15/23 0559   05/14/23 1230  metroNIDAZOLE (FLAGYL) IVPB 500 mg        500 mg 100 mL/hr over 60 Minutes Intravenous On call to O.R. 05/14/23 1218 05/15/23 0559   05/13/23 0915  cefTRIAXone (ROCEPHIN) 2 g in sodium chloride 0.9 % 100 mL IVPB       Note to Pharmacy: Pharmacy may adjust dosing strength / duration / interval for maximal efficacy   2 g 200 mL/hr over 30 Minutes Intravenous On call to O.R. 05/13/23 8119 05/14/23  0559   05/13/23 0915  metroNIDAZOLE (FLAGYL) IVPB 500 mg        500 mg 100 mL/hr over 60 Minutes Intravenous On call to O.R. 05/13/23 0823 05/14/23 0559        Assessment/Plan POD#6 - status post ex lap with lysis of adhesions - Dr. Luisa Hart, 05/14/2023 - Suspect she still is getting over an ileus. Allow FLD for options today. If any worsening of her symptoms would back her diet back down. Will start gentle bowel regimen today.  - Cont PICC/TPN - OOB, ambulate - PT rec no f/u - Pulm toilet   FEN - FLD. Start gentle bowel regimen. TPN. IVF per TRH.  VTE - SCDs, okay for chem ppx from our standpoint ID - Peri-op, none currently Foley - out. Voiding Plan - As above.     LOS: 10 days    Jacinto Halim , [facility] Surgery 05/20/2023, 10:47 AM Please see  Amion for pager number during day hours 7:00am-4:30pm

## 2023-05-20 NOTE — Progress Notes (Signed)
PHARMACY - TOTAL PARENTERAL NUTRITION CONSULT NOTE  Indication: SBO  Patient Measurements: Height: 5\' 5"  (165.1 cm) Weight: 53.6 kg (118 lb 2.7 oz) IBW/kg (Calculated) : 57 TPN AdjBW (KG): 53 Body mass index is 19.66 kg/m.  Assessment:  36 YOF presented to [facility] on 7/8 with abdominal pain. PMH significant for calcium pyrophosphate deposition disease, IBS, multiple pSBO, tubular adenoma of colon and narcotic bowel syndrome.  Underwent ex-lap with LoA and SBR in 2019 and had SBO in 2020 and 2023.  No improvement with conservative management, so underwent ex-lap with LoA and repair of SB serosal tear on 7/12.  Patient has been NPO since admission 7/8 and expect to remain NPO. Patient received IVF with D5 7/9- 7/10am and 7/11-7/12 am, which reduces refeeding risk. Pharmacy consulted to manage TPN.  Glucose / Insulin: no hx DM, BG < 180, SSI and CBG checks stopped 7/15 Electrolytes: K 4 (s/p incr in TPN), Cl 104, CO2 28 (s/p change to max Cl), Phos 4.3 - trending up, Mg 2.1 (s/p incr in TPN) Renal: Scr/BUN WNL and stable Hepatic: LFTs/Tbili/TG wnl, albumin 2.7 Intake / Output; MIVF: UOP not accurately documented, NGT removed 7/16 AM; patient has tolerated juice, italian ice, water, and ice chips. No BM, passing flatus GI Imaging:   7/8 CT: dilated CB loop, possible early partial CBO  7/8 KUB SBO protocol- contrast in stomach and dilated loops of CB 7/9 KUB: persisting SBO, contrast in SB loop 7/10 KUB: scattered large and CB gas, contrast in colon  7/11 KUB:  paucity of SB gas limits the ability to assess for obstruction. There is a dilated loop of bowel. GI Surgeries / Procedures:  7/9 NGT placed 7/12 ex-lap with LoA and repair of SB serosal tear  Central access: PICC 05/15/23 TPN start date: 05/15/23  Nutritional Goals: RD Assessment: Estimated Needs Total Energy Estimated Needs: 1700-1900 Total Protein Estimated Needs: 90-105 grams Total Fluid Estimated Needs: 1.7-1.9 L  Goal  TPN: Goal rate of 70 ml/hr provides 91g protein and 1724 kcals  Current Nutrition:  Clear liquids (started 7/16 AM) + TPN  Plan:   Continue TPN at goal 70 mL/hr at 1800, meeting 100% estimated needs  Electrolytes in TPN, no changes today: Na 100 mEq/L, K 60 mEq/L, Ca 3 mEq/L, Mg 12 mEq/L, reduce Phos to 15 mmol/L. Cl:Ac max Cl  Add standard MVI and trace elements to TPN Stop Sensitive q6h SSI and BG checks  Monitor TPN labs daily until stable at goal then on Mon/Thurs F/u ability to advance diet further   Link Snuffer, PharmD, BCPS, BCCCP Clinical Pharmacist Please refer to [facility] for [facility] Pharmacy numbers 05/20/2023 8:02 AM

## 2023-05-21 DIAGNOSIS — K56609 Unspecified intestinal obstruction, unspecified as to partial versus complete obstruction: Secondary | ICD-10-CM | POA: Diagnosis not present

## 2023-05-21 LAB — COMPREHENSIVE METABOLIC PANEL
ALT: 28 U/L (ref 0–44)
AST: 27 U/L (ref 15–41)
Albumin: 2.9 g/dL — ABNORMAL LOW (ref 3.5–5.0)
Alkaline Phosphatase: 71 U/L (ref 38–126)
Anion gap: 5 (ref 5–15)
BUN: 14 mg/dL (ref 8–23)
CO2: 30 mmol/L (ref 22–32)
Calcium: 8.7 mg/dL — ABNORMAL LOW (ref 8.9–10.3)
Chloride: 104 mmol/L (ref 98–111)
Creatinine, Ser: 0.52 mg/dL (ref 0.44–1.00)
GFR, Estimated: >60 mL/min (ref >60)
Glucose, Bld: 98 mg/dL (ref 70–99)
Potassium: 3.9 mmol/L (ref 3.5–5.1)
Sodium: 139 mmol/L (ref 135–145)
Total Bilirubin: 0.2 mg/dL — ABNORMAL LOW (ref 0.3–1.2)
Total Protein: 5.8 g/dL — ABNORMAL LOW (ref 6.5–8.1)

## 2023-05-21 LAB — PHOSPHORUS: Phosphorus: 4 mg/dL (ref 2.5–4.6)

## 2023-05-21 MED ORDER — BISACODYL 10 MG RE SUPP
10.0000 mg | Freq: Once | RECTAL | Status: AC
  Administered 2023-05-21: 10 mg via RECTAL

## 2023-05-21 MED ORDER — TRAVASOL 10 % IV SOLN
INTRAVENOUS | Status: AC
  Filled 2023-05-21: qty 907.2

## 2023-05-21 MED ORDER — ENSURE ENLIVE PO LIQD
237.0000 mL | Freq: Two times a day (BID) | ORAL | Status: DC
  Administered 2023-05-21 – 2023-05-23 (×5): 237 mL via ORAL

## 2023-05-21 NOTE — Plan of Care (Signed)

## 2023-05-21 NOTE — Progress Notes (Signed)
PHARMACY - TOTAL PARENTERAL NUTRITION CONSULT NOTE  Indication: SBO  Patient Measurements: Height: 5\' 5"  (165.1 cm) Weight: 53.6 kg (118 lb 2.7 oz) IBW/kg (Calculated) : 57 TPN AdjBW (KG): 53 Body mass index is 19.66 kg/m.  Assessment:  52 YOF presented to [facility] on 7/8 with abdominal pain. PMH significant for calcium pyrophosphate deposition disease, IBS, multiple pSBO, tubular adenoma of colon and narcotic bowel syndrome.  Underwent ex-lap with LoA and SBR in 2019 and had SBO in 2020 and 2023.  No improvement with conservative management, so underwent ex-lap with LoA and repair of SB serosal tear on 7/12.  Patient has been NPO since admission 7/8 and expect to remain NPO. Patient received IVF with D5 7/9- 7/10am and 7/11-7/12 am, which reduces refeeding risk. Pharmacy consulted to manage TPN.  Glucose / Insulin: no hx DM, BG < 180, SSI and CBG checks stopped 7/15 Electrolytes: last labs 7/18: K 4 (s/p incr in TPN), Cl 104, CO2 28 (s/p change to max Cl), Phos 4.3 - trending up, Mg 2.1 (s/p incr in TPN) Renal: Scr/BUN WNL and stable Hepatic: LFTs/Tbili/TG wnl, albumin 2.7 Intake / Output; MIVF: UOP not accurately documented, NGT removed 7/16 AM; No BM, passing flatus. Some nausea but no vomiting GI Imaging:   7/8 CT: dilated CB loop, possible early partial CBO  7/8 KUB SBO protocol- contrast in stomach and dilated loops of CB 7/9 KUB: persisting SBO, contrast in SB loop 7/10 KUB: scattered large and CB gas, contrast in colon  7/11 KUB:  paucity of SB gas limits the ability to assess for obstruction. There is a dilated loop of bowel. GI Surgeries / Procedures:  7/9 NGT placed 7/12 ex-lap with LoA and repair of SB serosal tear  Central access: PICC 05/15/23 TPN start date: 05/15/23  Nutritional Goals: RD Assessment: Estimated Needs Total Energy Estimated Needs: 1700-1900 Total Protein Estimated Needs: 90-105 grams Total Fluid Estimated Needs: 1.7-1.9 L  Goal TPN: Goal rate of  70 ml/hr provides 91g protein and 1724 kcals  Current Nutrition:  Full liquids + TPN  Plan:   No changes to TPN today - repeat labs unavailable given systemwide outage but have been stable  Continue TPN at goal 70 mL/hr at 1800, meeting 100% estimated needs  Electrolytes in TPN: Na 100 mEq/L, K 60 mEq/L, Ca 3 mEq/L, Mg 12 mEq/L, Phos 15 mmol/L. Cl:Ac max Cl  Add standard MVI and trace elements to TPN Stop Sensitive q6h SSI and BG checks  Monitor TPN labs daily until stable at goal then on Mon/Thurs F/u ability to advance diet further   Rexford Maus, PharmD, BCPS 05/21/2023 11:37 AM

## 2023-05-21 NOTE — Discharge Instructions (Signed)
Low Fiber Nutrition Therapy   You may need a low-fiber diet if you have Crohn's disease, diverticulitis, gastroparesis, ulcerative colitis, a new colostomy, or new ileostomy. A low-fiber diet may also be needed following radiation therapy to the pelvis and lower bowel or recent intestinal surgery.  A low-fiber diet reduces the frequency and volume of your stools. This lessens irritation to the gastrointestinal (GI) tract and can help you heal. Use this diet if you have a stricture so your intestine doesn't get blocked. The goal of this diet is to get less than 8 grams of fiber daily. It's also important to eat enough protein foods while you are on a low-fiber diet.  Drink nutrition supplements that have 1 gram of fiber or less in each serving. If your stricture is severe or if your inflammation is severe, drink more liquids to reduce symptoms and to get enough calories and protein.  Tips Eat about 5 to 6 small meals daily or about every 3 to 4 hours. Do not skip meals.  Every time you eat, include a small amount of protein (1 to 2 ounces) plus an additional food. Low fiber starch foods are the best choice to eat with protein.  Limit acidic, spicy and high-fat or fried and greasy foods to reduce GI symptoms.  Do not eat raw fruits and vegetables while on this diet. All fruits and vegetables need to be cooked and without peels or skins.  Drink a lot of fluids, at least 8 cups of fluid each day. Limit drinks with caffeine, sugar, and sugar substitutes.  Plain water is the best choice. Avoid mixing drink packets or flavor drops into water. .  Take a chewable multivitamin with minerals. Gummy vitamins do not have enough minerals and can block an ostomy and non-chewable supplements are not easily digested. Chewable supplements must be used if you have a stricture or ostomy.  If you are lactose intolerant, you may need to eat low-lactose dairy products. If you can't tolerate dairy, ask your RDN about how  you can get enough calcium from other foods.  Do not take a calcium supplement. They can cause a blockage.  It is important to add high-calcium foods gradually to your diet and monitor for symptoms to avoid a blockage.  Do not add more fiber to your diet until your health care provider or registered dietitian nutritionist (RDN) tells you it's OK. Fiber is part of whole grains, fruits and vegetables (foods from plants) and needs to be slowly added back in to your diet when your body is healed.  Choose foods that have been safely handled and prepared to lower your risk of foodborne illness. Talk to your RDN or see the Food Safety Nutrition Therapy handout for more information.   Foods Recommended These foods are low in fat and fiber and will help with your GI symptoms. Food Group Foods Recommended  Grains  Choose grain foods with less than 2 grams of fiber per serving. Refined white flour products--for example, enriched white bread without seeds, crackers or pasta Cream of wheat or rice Grits (fine ground) Tortillas: white flour or corn White rice, well-cooked (do not rinse, or soak before cooking) Cold and hot cereals made from white or refined flour such as puffed rice or corn flakes  Protein Foods  Lean, very tender, well-cooked poultry or fish; red meats: beef, pork or lamb (slow cook until soft; chop meats if you have stricture or ostomy) Eggs, well-cooked Smooth nut butters such as almond,  peanut, or sunflower Tofu  Dairy  If you have lactose intolerance, drinking milk products from cows or goats may make diarrhea worse. Foods marked with an asterisk (*) have lactose. Milk: fat-free, 1% or 2% * (choose best tolerated) Lactose-free milk Buttermilk* Fortified non-dairy milks: almond, cashew, coconut, or rice (be aware that these options are not good sources of protein so you will need to eat an additional protein food) Kefir* (Don't include kefir in the diet until approved by your health  care provider) Yogurt*/lactose-free yogurt (without nuts, fruit, granola or chocolate) Mild cheese* (hard and aged cheeses tend to be lower in lactose such as cheddar, swiss or parmesan) Cottage cheese* or lactose-free cottage cheese Low-fat ice cream* or lactose-free ice cream Sherbet* (usually lower lactose)  Vegetables  Canned and well-cooked vegetables without seeds, skins, or hulls  Carrots or green beans, cooked White, red or yellow potatoes without skins Strained vegetable juice  Fruit Soft, and well-cooked fruits without skins, seeds, or membranes Canned fruit in juice: peaches, pears, or applesauce Fruit juice without pulp diluted by half with water may be tolerated better Fruit drinks fortified with vitamin C may be tolerated better than 100% fruit juice  Oils  When possible, choose healthy oils and fats, such as olive and canola oils, plant oils rather than solid fats.  Other  Broth and strained soups made from allowed foods Desserts (small portions) without whole grains, seeds, nuts, raisins, or coconut Jelly (clear)   Foods Not Recommended These foods are higher in fat and fiber and may make your GI symptoms worse.  Food Group Foods Not Recommended  Grains  Bread, whole wheat or with whole grain flour or seeds or nuts Brown rice, quinoa, kasha, barley Tortillas: whole grain Whole wheat pasta Whole grain and high-fiber cereals, including oatmeal, bran flakes or shredded wheat Popcorn  Protein Foods  Steak, pork chops, or other meats that are fatty or have gristle Fried meat, poultry, or fish Seafood with a tough or rubbery texture, such as shrimp Luncheon meats such as bologna and salami Sausage, bacon, or hot dogs Dried beans, peas, or lentils Hummus Sushi Nuts and chunky nut butters  Dairy  Whole milk Pea milk and soymilk (may cause diarrhea, gas, bloating, and abdominal pain) Cream Half-and-half Sour cream Yogurt with added fruit, nuts, or granola or chocolate   Vegetables  Alfalfa or bean sprouts (high fiber and risk for bacteria) Raw or undercooked vegetables: beets; broccoli; brussels sprouts; cabbage; cauliflower; collard, mustard, or turnip greens; corn; cucumber; green peas or any kind of peas; kale; lima beans; mushrooms; okra; olives; pickles and relish; onions; parsnips; peppers; potato skins; sauerkraut; spinach; tomatoes  Fruit Raw fruit Dried fruit Avocado, berries, coconut Canned fruit in syrup Canned fruit with mandarin oranges, papaya or pineapple Fruit juice with pulp Prune juice Fruit skin  Oils  Pork rinds   Low-Fiber (8 grams) Sample 1-Day Menu  Breakfast  cup cream of wheat (0.5 gram fiber)  1 slice white toast (1 gram fiber)  1 teaspoon margarine, soft tub  2 scrambled eggs   Morning Snack 1 cup lactose-free nutrition supplement  Lunch 2 slices white bread (2 grams fiber)  3 tablespoons tuna  1 tablespoon mayonnaise  1 cup chicken noodle soup (1 gram fiber)   cup apple juice   Afternoon Snack 6 saltine crackers (0.5 gram fiber)  2 ounces low-fat cheddar cheese  Evening Meal 3 ounces tender chicken breast  1 cup white rice (0.5 gram fiber)   cup  cooked canned green beans (2 grams fiber)   cup cranberry juice   Evening Snack 1 cup lactose-free nutrition supplement   Copyright 2020  Academy [employer]  High Fiber Nutrition Therapy  Fiber and fluid may help you feel less constipated and bloated and can also help ease diarrhea. Increase fiber slowly over the course of a few weeks. This will keep your symptoms from getting worse. Tips Tips for Adding Fiber to Your Eating Plan Slowly increase the amount of fiber you eat to 25 to 35 grams per day. Eat whole grain breads and cereals. Look for choices with 100% whole wheat, rye, oats, or bran as the first or second ingredient. Have brown or wild rice instead of white rice or potatoes. Enjoy a variety of grains. Good choices include barley, oats,  farro, kamut, and quinoa. Bake with whole wheat flour. You can use it to replace some white or all-purpose flour in recipes. Enjoy baked beans more often! Add dried beans and peas to casseroles or soups. Choose fresh fruit and vegetables instead of juices. Eat fruits and vegetables with peels or skins on. Compare food labels of similar foods to find higher fiber choices. On packaged foods, the amount of fiber per serving is listed on the Nutrition Facts label. Check the Nutrition Facts labels and try to choose products with at least 4 g dietary fiber per serving. Drink plenty of fluids. Set a goal of at least 8 cups per day. You may need even more fluid as you eat higher amounts of fiber. Fluid helps your body process fiber without discomfort. Foods Recommended Foods With at Least 4 g Fiber per Serving Food Group Choose  Grains ?- cup high-fiber cereal  Dried beans and peas  cup cooked red beans, kidney beans, large lima beans, navy beans, pinto beans, white beans, lentils, or black-eyed peas  Vegetables 1 artichoke (cooked)  Fruits  cup blackberries or raspberries 4 dried prunes   Foods With 1 to 3 g Fiber per Serving Food Group Choose  Grains 1 bagel (3.5-inch diameter) 1 slice whole wheat, cracked wheat, pumpernickel, or rye bread 2-inch square cornbread 4 whole wheat crackers 1 bran, blueberry, cornmeal, or English muffin  cup cereal with 1-3 g fiber per serving (check dietary fiber on the product's Nutrition Facts label) 2 tablespoons wheat germ or whole wheat flour  Fruits 1 apple (3-inch diameter) or  cup applesauce  cup apricots (canned) 1 banana  cup cherries (canned or fresh)  cup cranberries (fresh) 3 dates 2 medium figs (fresh)  cup fruit cocktail (canned)  grapefruit 1 kiwi fruit 1 orange (2-inch diameter) 1 peach (fresh) or  cup peaches (canned) 1 pear (fresh) or  cup pears (canned) 1 plum (2-inch diameter)  cup raisins  cup strawberries  (fresh) 1 tangerine  Vegetables  cup bean sprouts (raw)  cup beets (diced, canned)  cup broccoli, brussels sprouts, or cabbage  (cooked)  cup carrots  cup cauliflower  cup corn  cup eggplant  cup okra (boiled)  cup potatoes (baked or mashed)  cup spinach, kale, or turnip greens (cooked)  cup squash--winter, summer, or zucchini (cooked)  cup sweet potatoes or yams  cup tomatoes (canned)  Other 2 tablespoons almonds or peanuts 1 cup popcorn (popped)   High Fiber Vegetarian (Lacto-Ovo) Sample 1-Day Menu  Breakfast  cup bran cereal  1 banana  cup blueberries 1 cup 1% milk  Lunch 2 slices whole wheat bread  2 tablespoons hummus 1 ounce cheddar cheese  1 leaf lettuce 2 slices tomato  cup vegetarian baked beans 1 orange 1 cup 1% milk  Evening Meal Stir fry made with:  cup tempeh   cup brown rice 1 cup frozen broccoli 1 tablespoon soy sauce  cup peanuts  1 pear  Evening Snack 6 ounces fruit yogurt  1 cup air popped popcorn  High Fiber Vegan Sample 1-Day Menu  Breakfast  cup bran cereal  1 banana  cup blueberries 1 cup soymilk fortified with calcium, vitamin B12, and vitamin D  Lunch  cup chili with beans with:   cup tempeh crumbles  cup crushed whole wheat crackers 1 apple 1 cup soymilk fortified with calcium, vitamin B12, and vitamin D  Evening Meal 1 veggie burger  1 whole wheat bun  1 leaf lettuce  1 slice tomato Salad made with: 1 cup lettuce  cup chickpeas   cucumbers 1 tablespoon italian dressing 1 cup strawberries   Evening Snack  cup almonds  1 cup carrot sticks  High Fiber Sample 1-Day Menu  Breakfast 1/2 cup orange juice, with pulp  1/2 cup raisin bran 1 cup fat-free milk 1 cup coffee  Morning Snack 1 cup plain yogurt  2 cups water  Lunch 1 1/2 cups chili  1/2 cup kidney beans 1/2 cup soy crumble 2 tablespoons shredded cheese 8 whole wheat crackers 1 apple (with skin)   Evening Meal 2 ounces sliced chicken  1/4 cup  tofu 2 cups mixed fresh vegetables 1 cup brown rice 1/2 cup strawberries 1 cup hot tea  Evening Snack 2 tablespoons almonds  1 cup hot chocolate   Copyright 2020  Academy [employer]etics  High-Fiber Nutrition Therapy  Fiber and fluid may help you feel less constipated and bloated and can also help ease diarrhea. Increase fiber slowly over the course of a few weeks. This will keep your symptoms from getting worse.  Tips Tips for Adding Fiber to Your Eating Plan Slowly increase the amount of fiber you eat to 25 to 35 grams per day. Eat whole grain breads and cereals. Look for choices with 100% whole wheat, rye, oats, or bran as the first or second ingredient. Have brown or wild rice instead of white rice or potatoes. Enjoy a variety of grains such as barley, oats, farro, kamut, and quinoa. Bake with whole wheat flour. You can use it to replace some white or all-purpose flour in recipes. Add dried beans and peas to casseroles or soups. Eat fruits and vegetables with peels or skins on. Choose fresh fruit and vegetables instead of juices. Check the Nutrition Facts labels and try to choose products with at least 4 g dietary fiber per serving. Drink at least 8 cups of fluid per day. You may need even more fluid as you eat higher amounts of fiber. Fluid helps your body process fiber without discomfort.  Foods High in Fiber (4 grams or more) Food Group Food Serving  Grains Cereal, bran  cup   Cereal, shredded wheat 1 cup   Oatmeal 1 cup   Popcorn 1 cup   Quinoa  cup   Wheat bran 3 tablespoons  Protein Foods Beans, canned, such as garbanzo or kidney  cup   Flaxseed, ground 2 tablespoons   Lentils  cup   Peas  cup   Soybeans  cup  Vegetables Potato with skin 1 medium   Mixed vegetables, frozen  cup  Fruit Blackberries or raspberries  cup   Coconut 1 ounce   Pear 1  medium  Foods Moderate in Fiber (1-3 grams) Food Group Food Serving  Grains Bread: whole wheat,  cracked wheat, pumpernickel, or rye 1 slice   Bun, hot dog or hamburger 1   Crackers, whole grain 4   English muffin 1   Pasta: chickpea, lentil, or whole grain  cup   Rice: brown or wild  cup   Wheat germ 2 tablespoons   Whole grains: barley, bulgur, farro, freekeh, millet, or spelt  cup  Protein Foods Nuts, all types  cup   Nut butters: almond, cashew, peanut 2 tablespoons   Seeds: pumpkin or sesame 2 tablespoons   Veggie burger 1  Vegetables Beets  cup   Broccoli  cup   Brussels sprouts  cup   Cabbage  cup   Carrots  cup   Cauliflower  cup   Corn  cup   Eggplant  cup   Greens: beet, collard, kale, or turnip  cup   Green beans  cup   Okra  cup   Spinach  cup   Squash  cup   Tomato sauce  cup   Tomato 1 medium  Fruit Apple 1 medium   Applesauce  cup   Avocado  cup   Banana 1 medium   Blueberries, cranberries, or strawberries  cup   Cherries 10   Dates 4 small   Fruit, canned  cup   Grapefruit    Kiwi 1   Orange 1 medium   Papaya    Peach 1 medium   Pineapple  cup   Plum 1   Prune juice  cup   Prunes 4   Raisins  cup   Tangerine 1 medium     High Fiber Sample 1-Day Menu View Nutrient Info Breakfast 1/2 cup orange juice, with pulp 1/2 cup raisin bran 1 cup fat-free milk 1 cup coffee  Morning Snack 1 cup plain yogurt 2 cups water  Lunch 1 1/2 cups chili 1/2 cup kidney beans 1/2 cup soy crumble 2 tablespoons shredded cheese 8 whole wheat crackers 1 apple (with skin)  Evening Meal 2 ounces sliced chicken 1/4 cup tofu 2 cups mixed fresh vegetables 1 cup brown rice 1/2 cup strawberries 1 cup hot tea  Evening Snack 2 tablespoons almonds 1 cup hot chocolate  Daily Sum Nutrient Unit Value  Macronutrients  Energy kcal 2058  Energy kJ 8620  Protein g 109  Total lipid (fat) g 53  Carbohydrate, by difference g 308  Fiber, total dietary g 57  Sugars, total g 95  Minerals  Calcium, Ca mg 1535  Iron, Fe mg 28  Sodium, Na mg  3269  Vitamins  Vitamin C, total ascorbic acid mg 86  Vitamin A, IU IU 18169  Vitamin D IU 168  Lipids  Fatty acids, total saturated g 16  Fatty acids, total monounsaturated g 19  Fatty acids, total polyunsaturated g 11  Cholesterol mg 131     High Fiber Vegan Sample 1-Day Menu View Nutrient Info Breakfast  cup bran cereal 1 banana  cup blueberries 1 cup soymilk fortified with calcium, vitamin B12, and vitamin D  Lunch  cup chili with beans with:  cup tempeh crumbles  cup crushed whole wheat crackers 1 apple 1 cup soymilk fortified with calcium, vitamin B12, and vitamin D  Evening Meal 1 veggie burger 1 whole wheat bun 1 leaf lettuce 1 slice tomato Salad made with: 1 cup lettuce  cup chickpeas  cucumbers 1 tablespoon italian dressing 1 cup strawberries  Evening  Snack  cup almonds 1 cup carrot sticks  Daily Sum Nutrient Unit Value  Macronutrients  Energy kcal 1558  Energy kJ 6527  Protein g 77  Total lipid (fat) g 57  Carbohydrate, by difference g 208  Fiber, total dietary g 43  Sugars, total g 73  Minerals  Calcium, Ca mg 1209  Iron, Fe mg 23  Sodium, Na mg 1823  Vitamins  Vitamin C, total ascorbic acid mg 136  Vitamin A, IU IU 29397  Vitamin D IU 265  Lipids  Fatty acids, total saturated g 9  Fatty acids, total monounsaturated g 22  Fatty acids, total polyunsaturated g 21  Cholesterol mg 35     High Fiber Vegetarian (Lacto-Ovo) Sample 1-Day Menu View Nutrient Info Breakfast  cup bran cereal 1 banana  cup blueberries 1 cup 1% milk  Lunch 2 slices whole wheat bread 2 tablespoons hummus 1 ounce cheddar cheese 1 leaf lettuce 2 slices tomato  cup vegetarian baked beans 1 orange 1 cup 1% milk  Evening Meal Stir fry made with:  cup tempeh  cup brown rice 1 cup frozen broccoli 1 tablespoon soy sauce  cup peanuts 1 pear  Evening Snack 6 ounces fruit yogurt 1 cup air popped popcorn  Daily Sum Nutrient Unit Value  Macronutrients   Energy kcal 1786  Energy kJ 7476  Protein g 89  Total lipid (fat) g 52  Carbohydrate, by difference g 270  Fiber, total dietary g 40  Sugars, total g 133  Minerals  Calcium, Ca mg 1496  Iron, Fe mg 16  Sodium, Na mg 1889  Vitamins  Vitamin C, total ascorbic acid mg 176  Vitamin A, IU IU 7107  Vitamin D IU 269  Lipids  Fatty acids, total saturated g 16  Fatty acids, total monounsaturated g 19  Fatty acids, total polyunsaturated g 11  Cholesterol mg 59    Copyright 2020  Academy of [employer]etics. All rights reserved

## 2023-05-21 NOTE — Progress Notes (Signed)
PROGRESS NOTE                                                                                                                                                                                                             Patient Demographics:    Bethany [name], is a 67 y.o. female, DOB - 1956-01-30, VHQ:[id number]  Outpatient Primary MD for the patient is Joselyn Arrow, MD    LOS - 11  Admit date - 05/10/2023    Chief Complaint  Patient presents with   Abdominal Pain       Brief Narrative (HPI from H&P)   67 year old with history of HLD, CPPD, alcohol/opiate abuse and remission admitted for abdominal pain.  Workup showed SBO underwent surgical correction on 05/14/2023.   Subjective:   Patient in bed, appears comfortable, denies any headache, no fever, no chest pain or pressure, no shortness of breath ,some post op abdominal pain. No new focal weakness. Some flatus but still no BM.   Assessment  & Plan :   SBO.  Failed conservative management seen by general surgery underwent surgical correction by Dr. Luisa Hart on 05/14/2023, has have some bowel sounds today, passing some flatus, NG tube removed by surgery on 05/18/2023, tolerating clear liquids since 05/18/2023, PICC line with TNA started on 05/15/2023 per general surgery, defer further management of this issue to general surgery.  Acute toxic encephalopathy caused by an excessive use of narcotics in the hospital, resolved.  Monitor  Dyslipidemia, mood disorder.  Resume home medications once taking p.o.  Anterior basement membrane dystrophy of eyes.  Continue home eyedrops  Hypokalemia and hypophosphatemia.  Replaced.       Condition - Fair  Family Communication  : Bedside 05/15/2023, updated over the phone [phone] 05/21/23  Code Status : DNR  Consults  : General surgery  PUD Prophylaxis : PPI   Procedures  :     Laparotomy by Dr. Luisa Hart general surgery on  05/14/2023 for SBO      Disposition Plan  :    Status is: Inpatient  DVT Prophylaxis  :    SCDs Start: 05/10/23 1058    Lab Results  Component Value Date   PLT 215 05/20/2023    Diet :  Diet Order             Diet full liquid Fluid  consistency: Thin  Diet effective now                    Inpatient Medications  Scheduled Meds:  acetaminophen  1,000 mg Oral Q6H   Chlorhexidine Gluconate Cloth  6 each Topical Daily   gabapentin  1,200 mg Oral QHS   gabapentin  600 mg Oral Daily   lamoTRIgine  25 mg Oral Daily   pantoprazole (PROTONIX) IV  40 mg Intravenous Q24H   polyethylene glycol  17 g Oral Daily   prednisoLONE acetate  1 drop Both Eyes QID   simvastatin  20 mg Oral q1800   traZODone  50 mg Oral QHS   Continuous Infusions:  TPN ADULT (ION) 70 mL/hr at 05/20/23 1822   PRN Meds:.bisacodyl, diphenhydrAMINE **OR** diphenhydrAMINE, guaiFENesin, hydrALAZINE, HYDROmorphone (DILAUDID) injection, ipratropium-albuterol, LORazepam, methocarbamol, metoprolol tartrate, naloxone **AND** sodium chloride flush, naLOXone (NARCAN)  injection, ondansetron **OR** ondansetron (ZOFRAN) IV, ondansetron (ZOFRAN) IV, oxyCODONE, phenol, senna-docusate, simethicone, sodium chloride flush, traZODone    Objective:   Vitals:   05/20/23 1854 05/20/23 2007 05/20/23 2305 05/21/23 0019  BP: 114/69 125/80 102/76 102/76  Pulse: 79 71 82 85  Resp: 14 16 12 12   Temp: 98.8 F (37.1 C) 98.7 F (37.1 C) 98.4 F (36.9 C) 98.4 F (36.9 C)  TempSrc: Oral Oral Oral Oral  SpO2: 95% 95% 96% 98%  Weight:      Height:        Wt Readings from Last 3 Encounters:  05/20/23 53.6 kg  05/05/23 52.6 kg  04/28/23 52.2 kg     Intake/Output Summary (Last 24 hours) at 05/21/2023 0858 Last data filed at 05/21/2023 0300 Gross per 24 hour  Intake 600.08 ml  Output --  Net 600.08 ml      Physical Exam  Awake Alert, No new F.N deficits,   [place].AT,PERRAL Supple Neck, No JVD,   Symmetrical Chest  wall movement, Good air movement bilaterally, CTAB RRR,No Gallops,Rubs or new Murmurs,  hypoactive B.Sounds, Abd Soft, No tenderness,   No Cyanosis, Clubbing or edema        Data Review:    Recent Labs  Lab 05/15/23 0530 05/16/23 0306 05/17/23 0349 05/18/23 0354 05/20/23 0327  WBC 9.1 9.6 12.1* 9.3 7.0  HGB 13.2 11.8* 11.7* 12.2 10.6*  HCT 39.6 35.4* 35.7* 36.0 33.4*  PLT 177 174 176 195 215  MCV 99.2 102.9* 100.0 101.4* 104.0*  MCH 33.1 34.3* 32.8 34.4* 33.0  MCHC 33.3 33.3 32.8 33.9 31.7  RDW 12.2 12.3 12.1 12.5 12.8    Recent Labs  Lab 05/15/23 0530 05/16/23 0306 05/17/23 0349 05/18/23 0354 05/19/23 0533 05/20/23 0327  NA 140 140 138 137 135 137  K 3.9 3.4* 3.8 4.0 4.3 4.0  CL 103 103 97* 102 103 104  CO2 28 31 28 28 27 28   ANIONGAP 9 6 13 7 5 5   GLUCOSE 103* 138* 130* 114* 121* 108*  BUN 18 13 10 12 13 13   CREATININE 0.75 0.63 0.52 0.53 0.49 0.55  AST 24  --  19 17 23 20   ALT 17  --  16 16 25 24   ALKPHOS 46  --  46 56 65 66  BILITOT 0.5  --  0.3 0.4 0.5 0.4  ALBUMIN 3.1*  --  2.8* 2.7* 2.8* 2.7*  MG 1.9 2.0 1.9 2.1 2.0 2.1  CALCIUM 8.6* 8.4* 8.8* 8.7* 8.5* 8.4*     Radiology Reports No results found.    Signature  -  Susa Raring M.D on 05/21/2023 at 8:58 AM   -  To page go to [email or web address]

## 2023-05-21 NOTE — Care Management Important Message (Signed)
Important Message  Patient Details  Name: Bethany [name] MRN: [id number] Date of Birth: 01-19-1956   Medicare Important Message Given:  Yes     Sherilyn Banker 05/21/2023, 3:25 PM

## 2023-05-21 NOTE — Progress Notes (Signed)
7 Days Post-Op  Subjective: CC: Abdomen feels less tight today and she is tolerating fld without n/v. Passing flatus. No bm. . Tolerating cld without vomiting. Still some nausea but improved. Burping/belching improved. Passing flatus. No BM. Still having pain around her incision. Voiding. Mobilizing in the halls   Objective: Vital signs in last 24 hours: Temp:  [98.4 F (36.9 C)-98.8 F (37.1 C)] 98.4 F (36.9 C) (07/19 0019) Pulse Rate:  [71-85] 85 (07/19 0019) Resp:  [12-16] 12 (07/19 0019) BP: (102-125)/(69-80) 102/76 (07/19 0019) SpO2:  [95 %-98 %] 98 % (07/19 0019) Last BM Date : 05/10/23  Intake/Output from previous day: 07/18 0701 - 07/19 0700 In: 600.1 [I.V.:600.1] Out: -  Intake/Output this shift: No intake/output data recorded.  PE: Gen:  Alert, NAD, pleasant Abd: Soft, less distension today. Some generalized ttp, most around her incision. No rigidity or guarding. Some BS. Midline wound with some reactive appearing erythema near lower 1/3 of staples but is otherwise cdi with staples in place - stable   Lab Results:  Recent Labs    05/20/23 0327  WBC 7.0  HGB 10.6*  HCT 33.4*  PLT 215   BMET Recent Labs    05/19/23 0533 05/20/23 0327  NA 135 137  K 4.3 4.0  CL 103 104  CO2 27 28  GLUCOSE 121* 108*  BUN 13 13  CREATININE 0.49 0.55  CALCIUM 8.5* 8.4*   PT/INR No results for input(s): "LABPROT", "INR" in the last 72 hours. CMP     Component Value Date/Time   NA 137 05/20/2023 0327   NA 140 04/29/2023 0810   K 4.0 05/20/2023 0327   CL 104 05/20/2023 0327   CO2 28 05/20/2023 0327   GLUCOSE 108 (H) 05/20/2023 0327   BUN 13 05/20/2023 0327   BUN 17 04/29/2023 0810   CREATININE 0.55 05/20/2023 0327   CREATININE 1.05 (H) 03/26/2017 0929   CALCIUM 8.4 (L) 05/20/2023 0327   PROT 5.6 (L) 05/20/2023 0327   PROT 6.9 04/29/2023 0810   ALBUMIN 2.7 (L) 05/20/2023 0327   ALBUMIN 4.8 04/29/2023 0810   AST 20 05/20/2023 0327   ALT 24 05/20/2023 0327    ALKPHOS 66 05/20/2023 0327   BILITOT 0.4 05/20/2023 0327   BILITOT 0.3 04/29/2023 0810   GFRNONAA >60 05/20/2023 0327   GFRAA 83 10/18/2020 1227   Lipase     Component Value Date/Time   LIPASE 27 05/10/2023 0537    Studies/Results: No results found.  Anti-infectives: Anti-infectives (From admission, onward)    Start     Dose/Rate Route Frequency Ordered Stop   05/14/23 1230  cefTRIAXone (ROCEPHIN) 2 g in sodium chloride 0.9 % 100 mL IVPB       Note to Pharmacy: Pharmacy may adjust dosing strength / duration / interval for maximal efficacy   2 g 200 mL/hr over 30 Minutes Intravenous On call to O.R. 05/14/23 1218 05/15/23 0559   05/14/23 1230  metroNIDAZOLE (FLAGYL) IVPB 500 mg        500 mg 100 mL/hr over 60 Minutes Intravenous On call to O.R. 05/14/23 1218 05/15/23 0559   05/13/23 0915  cefTRIAXone (ROCEPHIN) 2 g in sodium chloride 0.9 % 100 mL IVPB       Note to Pharmacy: Pharmacy may adjust dosing strength / duration / interval for maximal efficacy   2 g 200 mL/hr over 30 Minutes Intravenous On call to O.R. 05/13/23 4034 05/14/23 0559   05/13/23 0915  metroNIDAZOLE (FLAGYL) IVPB  500 mg        500 mg 100 mL/hr over 60 Minutes Intravenous On call to O.R. 05/13/23 0823 05/14/23 0559        Assessment/Plan POD#7 - status post ex lap with lysis of adhesions - Dr. Luisa Hart, 05/14/2023 - Slowly getting over an ileus. Cont FLD for options today. If any worsening of her symptoms would back her diet back down and could consider imaging. Cont gentle bowel regimen  - Cont PICC/TPN - OOB, ambulate - PT rec no f/u - Pulm toilet   FEN - FLD. Cont gentle bowel regimen + suppository. TPN. IVF per TRH.  VTE - SCDs, okay for chem ppx from our standpoint ID - Peri-op, none currently. Afebrile. WBC wnl. Monitor  Foley - out. Voiding Plan - As above.     LOS: 11 days    Jacinto Halim , [facility] 05/21/2023, 9:47 AM Please see Amion for pager number during  day hours 7:00am-4:30pm

## 2023-05-22 DIAGNOSIS — K56609 Unspecified intestinal obstruction, unspecified as to partial versus complete obstruction: Secondary | ICD-10-CM | POA: Diagnosis not present

## 2023-05-22 LAB — CBC WITH DIFFERENTIAL/PLATELET
Abs Immature Granulocytes: 0.11 10*3/uL — ABNORMAL HIGH (ref 0.00–0.07)
Basophils Absolute: 0.1 10*3/uL (ref 0.0–0.1)
Basophils Relative: 1 %
Eosinophils Absolute: 0.4 10*3/uL (ref 0.0–0.5)
Eosinophils Relative: 5 %
HCT: 31.4 % — ABNORMAL LOW (ref 36.0–46.0)
Hemoglobin: 10 g/dL — ABNORMAL LOW (ref 12.0–15.0)
Immature Granulocytes: 1 %
Lymphocytes Relative: 17 %
Lymphs Abs: 1.4 10*3/uL (ref 0.7–4.0)
MCH: 33.3 pg (ref 26.0–34.0)
MCHC: 31.8 g/dL (ref 30.0–36.0)
MCV: 104.7 fL — ABNORMAL HIGH (ref 80.0–100.0)
Monocytes Absolute: 0.7 10*3/uL (ref 0.1–1.0)
Monocytes Relative: 9 %
Neutro Abs: 5.4 10*3/uL (ref 1.7–7.7)
Neutrophils Relative %: 67 %
Platelets: 241 10*3/uL (ref 150–400)
RBC: 3 MIL/uL — ABNORMAL LOW (ref 3.87–5.11)
RDW: 12.8 % (ref 11.5–15.5)
WBC: 8 10*3/uL (ref 4.0–10.5)
nRBC: 0 % (ref 0.0–0.2)

## 2023-05-22 LAB — COMPREHENSIVE METABOLIC PANEL
ALT: 40 U/L (ref 0–44)
AST: 40 U/L (ref 15–41)
Albumin: 2.9 g/dL — ABNORMAL LOW (ref 3.5–5.0)
Alkaline Phosphatase: 68 U/L (ref 38–126)
Anion gap: 6 (ref 5–15)
BUN: 16 mg/dL (ref 8–23)
CO2: 29 mmol/L (ref 22–32)
Calcium: 8.7 mg/dL — ABNORMAL LOW (ref 8.9–10.3)
Chloride: 102 mmol/L (ref 98–111)
Creatinine, Ser: 0.66 mg/dL (ref 0.44–1.00)
GFR, Estimated: >60 mL/min (ref >60)
Glucose, Bld: 107 mg/dL — ABNORMAL HIGH (ref 70–99)
Potassium: 3.9 mmol/L (ref 3.5–5.1)
Sodium: 137 mmol/L (ref 135–145)
Total Bilirubin: 0.3 mg/dL (ref 0.3–1.2)
Total Protein: 5.8 g/dL — ABNORMAL LOW (ref 6.5–8.1)

## 2023-05-22 MED ORDER — TRAMADOL HCL 50 MG PO TABS
50.0000 mg | ORAL_TABLET | Freq: Four times a day (QID) | ORAL | Status: DC | PRN
  Administered 2023-05-23 (×2): 50 mg via ORAL
  Filled 2023-05-22 (×2): qty 1

## 2023-05-22 MED ORDER — TRAVASOL 10 % IV SOLN
INTRAVENOUS | Status: AC
  Filled 2023-05-22: qty 907.2

## 2023-05-22 NOTE — Progress Notes (Signed)
PHARMACY - TOTAL PARENTERAL NUTRITION CONSULT NOTE  Indication: SBO  Patient Measurements: Height: 5\' 5"  (165.1 cm) Weight: 53.9 kg (118 lb 13.3 oz) IBW/kg (Calculated) : 57 TPN AdjBW (KG): 53 Body mass index is 19.77 kg/m.  Assessment:  22 YOF presented to [facility] on 7/8 with abdominal pain. PMH significant for calcium pyrophosphate deposition disease, IBS, multiple pSBO, tubular adenoma of colon and narcotic bowel syndrome.  Underwent ex-lap with LoA and SBR in 2019 and had SBO in 2020 and 2023.  No improvement with conservative management, so underwent ex-lap with LoA and repair of SB serosal tear on 7/12.  Patient has been NPO since admission 7/8 and expect to remain NPO. Patient received IVF with D5 7/9- 7/10am and 7/11-7/12 am, which reduces refeeding risk. Pharmacy consulted to manage TPN.  Glucose / Insulin: no hx DM, BG < 180, SSI and CBG checks stopped 7/15 Electrolytes: K 3.9, Cl 102, CO2 29 (max Cl in TPN), Phos 4, last Mg 2.1 (s/p incr in TPN) Renal: Scr/BUN WNL and stable Hepatic: LFTs/Tbili/TG wnl, albumin 2.9 Intake / Output; MIVF: UOP not accurately documented  x 1 occurrence, NGT removed 7/16 AM; LBM 7/19, passing flatus GI Imaging:   7/8 CT: dilated CB loop, possible early partial CBO  7/8 KUB SBO protocol- contrast in stomach and dilated loops of CB 7/9 KUB: persisting SBO, contrast in SB loop 7/10 KUB: scattered large and CB gas, contrast in colon  7/11 KUB:  paucity of SB gas limits the ability to assess for obstruction. There is a dilated loop of bowel. GI Surgeries / Procedures:  7/9 NGT placed 7/12 ex-lap with LoA and repair of SB serosal tear  Central access: PICC 05/15/23 TPN start date: 05/15/23  Nutritional Goals: RD Assessment: Estimated Needs Total Energy Estimated Needs: 1700-1900 Total Protein Estimated Needs: 90-105 grams Total Fluid Estimated Needs: 1.7-1.9 L  Goal TPN: Goal rate of 70 ml/hr provides 91g protein and 1724 kcals  Current  Nutrition:  Soft diet + TPN Ensure BID added 7/19  Plan:   Continue TPN at goal 70 mL/hr at 1800, meeting 100% estimated needs  Electrolytes in TPN: Na 100 mEq/L, K 60 mEq/L, Ca 3 mEq/L, Mg 12 mEq/L, Phos 15 mmol/L. Cl:Ac max Cl  Add standard MVI and trace elements to TPN Stop Sensitive q6h SSI and BG checks  Monitor TPN labs daily until stable at goal then on Mon/Thurs F/u ability to wean TPN - diet advanced to soft today and Ensure's added   Rexford Maus, PharmD, BCPS 05/22/2023 8:29 AM

## 2023-05-22 NOTE — Progress Notes (Signed)
PROGRESS NOTE                                                                                                                                                                                                             Patient Demographics:    Bethany [name], is a 67 y.o. female, DOB - 1956-06-29, NFA:[id number]  Outpatient Primary MD for the patient is Joselyn Arrow, MD    LOS - 12  Admit date - 05/10/2023    Chief Complaint  Patient presents with   Abdominal Pain       Brief Narrative (HPI from H&P)   67 year old with history of HLD, CPPD, alcohol/opiate abuse and remission admitted for abdominal pain.  Workup showed SBO underwent surgical correction on 05/14/2023.   Subjective:   Patient in bed, appears comfortable, denies any headache, no fever, no chest pain or pressure, no shortness of breath , no abdominal pain. No new focal weakness, ++ BMs   Assessment  & Plan :   SBO.  Failed conservative management seen by general surgery underwent surgical correction by Dr. Luisa Hart on 05/14/2023, having BMs starting 05/21/2023, NG tube removed by surgery on 05/18/2023, advance to soft diet on 05/22/2023 by general surgery, PICC line with TNA started on 05/15/2023 per general surgery, defer further management of this issue to general surgery.  Acute toxic encephalopathy caused by an excessive use of narcotics in the hospital, resolved.  Monitor  Dyslipidemia, mood disorder.  Resume home medications once taking p.o.  Anterior basement membrane dystrophy of eyes.  Continue home eyedrops  Hypokalemia and hypophosphatemia.  Replaced.       Condition - Fair  Family Communication  : Bedside 05/15/2023, updated over the phone [phone] 05/21/23, husband bedside 05/21/2023  Code Status : DNR  Consults  : General surgery  PUD Prophylaxis : PPI   Procedures  :     Laparotomy by Dr. Luisa Hart general surgery on 05/14/2023 for SBO       Disposition Plan  :    Status is: Inpatient  DVT Prophylaxis  :    SCDs Start: 05/10/23 1058    Lab Results  Component Value Date   PLT 241 05/22/2023    Diet :  Diet Order             DIET SOFT Room service appropriate? Yes; Fluid  consistency: Thin  Diet effective now                    Inpatient Medications  Scheduled Meds:  acetaminophen  1,000 mg Oral Q6H   Chlorhexidine Gluconate Cloth  6 each Topical Daily   feeding supplement  237 mL Oral BID BM   gabapentin  1,200 mg Oral QHS   gabapentin  600 mg Oral Daily   lamoTRIgine  25 mg Oral Daily   pantoprazole (PROTONIX) IV  40 mg Intravenous Q24H   polyethylene glycol  17 g Oral Daily   prednisoLONE acetate  1 drop Both Eyes QID   simvastatin  20 mg Oral q1800   traZODone  50 mg Oral QHS   Continuous Infusions:  TPN ADULT (ION) 70 mL/hr at 05/22/23 0319   PRN Meds:.bisacodyl, diphenhydrAMINE **OR** diphenhydrAMINE, guaiFENesin, hydrALAZINE, HYDROmorphone (DILAUDID) injection, ipratropium-albuterol, LORazepam, methocarbamol, metoprolol tartrate, naloxone **AND** sodium chloride flush, naLOXone (NARCAN)  injection, ondansetron **OR** ondansetron (ZOFRAN) IV, ondansetron (ZOFRAN) IV, oxyCODONE, phenol, senna-docusate, simethicone, sodium chloride flush, traMADol, traZODone    Objective:   Vitals:   05/21/23 1645 05/21/23 2022 05/22/23 0037 05/22/23 0500  BP: 129/70 125/68 111/65   Pulse: 79  67   Resp: 14 20 20    Temp: 98.5 F (36.9 C)  98.3 F (36.8 C)   TempSrc: Oral     SpO2: 98%  98%   Weight:    53.9 kg  Height:        Wt Readings from Last 3 Encounters:  05/22/23 53.9 kg  05/05/23 52.6 kg  04/28/23 52.2 kg     Intake/Output Summary (Last 24 hours) at 05/22/2023 0955 Last data filed at 05/22/2023 0319 Gross per 24 hour  Intake 1710.14 ml  Output --  Net 1710.14 ml      Physical Exam  Awake Alert, No new F.N deficits,   [place].AT,PERRAL Supple Neck, No JVD,   Symmetrical Chest  wall movement, Good air movement bilaterally, CTAB RRR,No Gallops,Rubs or new Murmurs,  hypoactive B.Sounds, Abd Soft, No tenderness,   No Cyanosis, Clubbing or edema        Data Review:    Recent Labs  Lab 05/16/23 0306 05/17/23 0349 05/18/23 0354 05/20/23 0327 05/22/23 0320  WBC 9.6 12.1* 9.3 7.0 8.0  HGB 11.8* 11.7* 12.2 10.6* 10.0*  HCT 35.4* 35.7* 36.0 33.4* 31.4*  PLT 174 176 195 215 241  MCV 102.9* 100.0 101.4* 104.0* 104.7*  MCH 34.3* 32.8 34.4* 33.0 33.3  MCHC 33.3 32.8 33.9 31.7 31.8  RDW 12.3 12.1 12.5 12.8 12.8  LYMPHSABS  --   --   --   --  1.4  MONOABS  --   --   --   --  0.7  EOSABS  --   --   --   --  0.4  BASOSABS  --   --   --   --  0.1    Recent Labs  Lab 05/16/23 0306 05/16/23 0306 05/17/23 0349 05/18/23 0354 05/19/23 0533 05/20/23 0327 05/21/23 0224 05/22/23 0320  NA 140  --  138 137 135 137 139 137  K 3.4*  --  3.8 4.0 4.3 4.0 3.9 3.9  CL 103  --  97* 102 103 104 104 102  CO2 31  --  28 28 27 28 30 29   ANIONGAP 6  --  13 7 5 5 5 6   GLUCOSE 138*  --  130* 114* 121* 108* 98 107*  BUN 13  --  10 12 13 13 14 16   CREATININE 0.63  --  0.52 0.53 0.49 0.55 0.52 0.66  AST  --    < > 19 17 23 20 27  40  ALT  --    < > 16 16 25 24 28  40  ALKPHOS  --    < > 46 56 65 66 71 68  BILITOT  --    < > 0.3 0.4 0.5 0.4 0.2* 0.3  ALBUMIN  --    < > 2.8* 2.7* 2.8* 2.7* 2.9* 2.9*  MG 2.0  --  1.9 2.1 2.0 2.1  --   --   CALCIUM 8.4*  --  8.8* 8.7* 8.5* 8.4* 8.7* 8.7*   < > = values in this interval not displayed.     Radiology Reports No results found.    Signature  -   Susa Raring M.D on 05/22/2023 at 9:55 AM   -  To page go to [email or web address]

## 2023-05-22 NOTE — Plan of Care (Signed)

## 2023-05-22 NOTE — Plan of Care (Signed)

## 2023-05-22 NOTE — Progress Notes (Signed)
8 Days Post-Op   Subjective/Chief Complaint: PT doing well Had BM with supp yesterday and some flatus today On FLD   Objective: Vital signs in last 24 hours: Temp:  [98 F (36.7 C)-98.5 F (36.9 C)] 98.3 F (36.8 C) (07/20 0037) Pulse Rate:  [67-86] 67 (07/20 0037) Resp:  [12-20] 20 (07/20 0037) BP: (102-129)/(60-76) 111/65 (07/20 0037) SpO2:  [98 %] 98 % (07/20 0037) Weight:  [53.9 kg] 53.9 kg (07/20 0500) Last BM Date : 05/21/23  Intake/Output from previous day: 07/19 0701 - 07/20 0700 In: 1710.1 [I.V.:1710.1] Out: -  Intake/Output this shift: No intake/output data recorded.  PE: Gen:  Alert, NAD, pleasant Abd: Soft, less distension today. Some generalized ttp, most around her incision. No rigidity or guarding. Some BS. Midline wound with some reactive appearing erythema near lower 1/3 of staples but is otherwise cdi with staples in place - stable   Lab Results:  Recent Labs    05/20/23 0327 05/22/23 0320  WBC 7.0 8.0  HGB 10.6* 10.0*  HCT 33.4* 31.4*  PLT 215 241   BMET Recent Labs    05/21/23 0224 05/22/23 0320  NA 139 137  K 3.9 3.9  CL 104 102  CO2 30 29  GLUCOSE 98 107*  BUN 14 16  CREATININE 0.52 0.66  CALCIUM 8.7* 8.7*   PT/INR No results for input(s): "LABPROT", "INR" in the last 72 hours. ABG No results for input(s): "PHART", "HCO3" in the last 72 hours.  Invalid input(s): "PCO2", "PO2"  Studies/Results: No results found.  Anti-infectives: Anti-infectives (From admission, onward)    Start     Dose/Rate Route Frequency Ordered Stop   05/14/23 1230  cefTRIAXone (ROCEPHIN) 2 g in sodium chloride 0.9 % 100 mL IVPB       Note to Pharmacy: Pharmacy may adjust dosing strength / duration / interval for maximal efficacy   2 g 200 mL/hr over 30 Minutes Intravenous On call to O.R. 05/14/23 1218 05/15/23 0559   05/14/23 1230  metroNIDAZOLE (FLAGYL) IVPB 500 mg        500 mg 100 mL/hr over 60 Minutes Intravenous On call to O.R. 05/14/23 1218  05/15/23 0559   05/13/23 0915  cefTRIAXone (ROCEPHIN) 2 g in sodium chloride 0.9 % 100 mL IVPB       Note to Pharmacy: Pharmacy may adjust dosing strength / duration / interval for maximal efficacy   2 g 200 mL/hr over 30 Minutes Intravenous On call to O.R. 05/13/23 4696 05/14/23 0559   05/13/23 0915  metroNIDAZOLE (FLAGYL) IVPB 500 mg        500 mg 100 mL/hr over 60 Minutes Intravenous On call to O.R. 05/13/23 0823 05/14/23 0559       Assessment/Plan: POD#8 - status post ex lap with lysis of adhesions - Dr. Luisa Hart, 05/14/2023 - Slowly getting over an ileus. Will adv to softs - Cont PICC/TPN - OOB, ambulate - PT rec no f/u - Pulm toilet   FEN - soft diet . Cont gentle bowel regimen + suppository. TPN. IVF per TRH.  VTE - SCDs, okay for chem ppx from our standpoint ID - Peri-op, none currently. Afebrile. WBC wnl. Monitor  Foley - out. Voiding Plan - As above.   LOS: 12 days    Axel Filler 05/22/2023

## 2023-05-23 DIAGNOSIS — K56609 Unspecified intestinal obstruction, unspecified as to partial versus complete obstruction: Secondary | ICD-10-CM | POA: Diagnosis not present

## 2023-05-23 LAB — CBC WITH DIFFERENTIAL/PLATELET
Abs Immature Granulocytes: 0.12 10*3/uL — ABNORMAL HIGH (ref 0.00–0.07)
Basophils Absolute: 0.1 10*3/uL (ref 0.0–0.1)
Basophils Relative: 1 %
Eosinophils Absolute: 0.4 10*3/uL (ref 0.0–0.5)
Eosinophils Relative: 5 %
HCT: 31 % — ABNORMAL LOW (ref 36.0–46.0)
Hemoglobin: 10 g/dL — ABNORMAL LOW (ref 12.0–15.0)
Immature Granulocytes: 2 %
Lymphocytes Relative: 14 %
Lymphs Abs: 1.2 10*3/uL (ref 0.7–4.0)
MCH: 34.8 pg — ABNORMAL HIGH (ref 26.0–34.0)
MCHC: 32.3 g/dL (ref 30.0–36.0)
MCV: 108 fL — ABNORMAL HIGH (ref 80.0–100.0)
Monocytes Absolute: 0.7 10*3/uL (ref 0.1–1.0)
Monocytes Relative: 9 %
Neutro Abs: 5.7 10*3/uL (ref 1.7–7.7)
Neutrophils Relative %: 69 %
Platelets: 250 10*3/uL (ref 150–400)
RBC: 2.87 MIL/uL — ABNORMAL LOW (ref 3.87–5.11)
RDW: 12.8 % (ref 11.5–15.5)
WBC: 8.2 10*3/uL (ref 4.0–10.5)
nRBC: 0 % (ref 0.0–0.2)

## 2023-05-23 LAB — BASIC METABOLIC PANEL
Anion gap: 5 (ref 5–15)
BUN: 16 mg/dL (ref 8–23)
CO2: 31 mmol/L (ref 22–32)
Calcium: 8.4 mg/dL — ABNORMAL LOW (ref 8.9–10.3)
Chloride: 103 mmol/L (ref 98–111)
Creatinine, Ser: 0.67 mg/dL (ref 0.44–1.00)
GFR, Estimated: >60 mL/min (ref >60)
Glucose, Bld: 126 mg/dL — ABNORMAL HIGH (ref 70–99)
Potassium: 3.7 mmol/L (ref 3.5–5.1)
Sodium: 139 mmol/L (ref 135–145)

## 2023-05-23 MED ORDER — DOCUSATE SODIUM 100 MG PO CAPS
200.0000 mg | ORAL_CAPSULE | Freq: Two times a day (BID) | ORAL | Status: DC
  Administered 2023-05-23 – 2023-05-24 (×2): 200 mg via ORAL
  Filled 2023-05-23 (×2): qty 2

## 2023-05-23 MED ORDER — POLYETHYLENE GLYCOL 3350 17 G PO PACK
17.0000 g | PACK | Freq: Two times a day (BID) | ORAL | Status: DC
  Administered 2023-05-23 – 2023-05-24 (×2): 17 g via ORAL
  Filled 2023-05-23 (×2): qty 1

## 2023-05-23 NOTE — Plan of Care (Signed)

## 2023-05-23 NOTE — Progress Notes (Signed)
PHARMACY - TOTAL PARENTERAL NUTRITION CONSULT NOTE  Indication: SBO  Patient Measurements: Height: 5\' 5"  (165.1 cm) Weight: 57.3 kg (126 lb 5.2 oz) IBW/kg (Calculated) : 57 TPN AdjBW (KG): 53 Body mass index is 21.02 kg/m.  Assessment:  82 YOF presented to [facility] on 7/8 with abdominal pain. PMH significant for calcium pyrophosphate deposition disease, IBS, multiple pSBO, tubular adenoma of colon and narcotic bowel syndrome.  Underwent ex-lap with LoA and SBR in 2019 and had SBO in 2020 and 2023.  No improvement with conservative management, so underwent ex-lap with LoA and repair of SB serosal tear on 7/12.  Patient has been NPO since admission 7/8 and expect to remain NPO. Patient received IVF with D5 7/9- 7/10am and 7/11-7/12 am, which reduces refeeding risk. Pharmacy consulted to manage TPN.  Glucose / Insulin: no hx DM, BG < 180, SSI and CBG checks stopped 7/15 Electrolytes: K 3.7, Cl 103, CO2 31 (max Cl in TPN), last Phos 4, last Mg 2.1 (s/p incr in TPN) Renal: Scr/BUN WNL and stable Hepatic: LFTs/Tbili/TG wnl, albumin 2.9 Intake / Output; MIVF: UOP not accurately documented  x 1 occurrence, NGT removed 7/16 AM; LBM 7/20, passing flatus, 480 mL PO intake GI Imaging:   7/8 CT: dilated CB loop, possible early partial CBO  7/8 KUB SBO protocol- contrast in stomach and dilated loops of CB 7/9 KUB: persisting SBO, contrast in SB loop 7/10 KUB: scattered large and CB gas, contrast in colon  7/11 KUB:  paucity of SB gas limits the ability to assess for obstruction. There is a dilated loop of bowel. GI Surgeries / Procedures:  7/9 NGT placed 7/12 ex-lap with LoA and repair of SB serosal tear  Central access: PICC 05/15/23 TPN start date: 05/15/23  Nutritional Goals: RD Assessment: Estimated Needs Total Energy Estimated Needs: 1700-1900 Total Protein Estimated Needs: 90-105 grams Total Fluid Estimated Needs: 1.7-1.9 L  Goal TPN: Goal rate of 70 ml/hr provides 91g protein and  1724 kcals  Current Nutrition:  Soft diet + TPN Ensure BID added 7/19  Plan:  Wean TPN off today per d/w medical team Reduce current TPN bag to half rate at 45 mL/hr x 2 hours then discontinue TPN - d/w RN Discontinue associated labs/orders   Rexford Maus, PharmD, BCPS 05/23/2023 9:34 AM

## 2023-05-23 NOTE — Progress Notes (Signed)
9 Days Post-Op   Subjective/Chief Complaint: Doing well   tol soft diet   Objective: Vital signs in last 24 hours: Temp:  [97.8 F (36.6 C)-98 F (36.7 C)] 97.8 F (36.6 C) (07/21 0540) Pulse Rate:  [74-78] 78 (07/20 2126) Resp:  [17-19] 18 (07/21 0540) BP: (116-137)/(72-80) 130/80 (07/21 0540) SpO2:  [97 %-100 %] 97 % (07/21 0540) Weight:  [57.3 kg] 57.3 kg (07/21 0300) Last BM Date : 05/21/23  Intake/Output from previous day: 07/20 0701 - 07/21 0700 In: 1969 [P.O.:480; I.V.:1489] Out: -  Intake/Output this shift: No intake/output data recorded.  PE: Gen:  Alert, NAD, pleasant Abd: Soft, less distension today. Some generalized ttp, most around her incision. No rigidity or guarding. Some BS. Midline wound with some reactive appearing erythema near lower 1/3 of staples but is otherwise cdi with staples in place - stable  Lab Results:  Recent Labs    05/22/23 0320 05/23/23 0321  WBC 8.0 8.2  HGB 10.0* 10.0*  HCT 31.4* 31.0*  PLT 241 250   BMET Recent Labs    05/22/23 0320 05/23/23 0321  NA 137 139  K 3.9 3.7  CL 102 103  CO2 29 31  GLUCOSE 107* 126*  BUN 16 16  CREATININE 0.66 0.67  CALCIUM 8.7* 8.4*   PT/INR No results for input(s): "LABPROT", "INR" in the last 72 hours. ABG No results for input(s): "PHART", "HCO3" in the last 72 hours.  Invalid input(s): "PCO2", "PO2"  Studies/Results: No results found.  Anti-infectives: Anti-infectives (From admission, onward)    Start     Dose/Rate Route Frequency Ordered Stop   05/14/23 1230  cefTRIAXone (ROCEPHIN) 2 g in sodium chloride 0.9 % 100 mL IVPB       Note to Pharmacy: Pharmacy may adjust dosing strength / duration / interval for maximal efficacy   2 g 200 mL/hr over 30 Minutes Intravenous On call to O.R. 05/14/23 1218 05/15/23 0559   05/14/23 1230  metroNIDAZOLE (FLAGYL) IVPB 500 mg        500 mg 100 mL/hr over 60 Minutes Intravenous On call to O.R. 05/14/23 1218 05/15/23 0559   05/13/23 0915   cefTRIAXone (ROCEPHIN) 2 g in sodium chloride 0.9 % 100 mL IVPB       Note to Pharmacy: Pharmacy may adjust dosing strength / duration / interval for maximal efficacy   2 g 200 mL/hr over 30 Minutes Intravenous On call to O.R. 05/13/23 5409 05/14/23 0559   05/13/23 0915  metroNIDAZOLE (FLAGYL) IVPB 500 mg        500 mg 100 mL/hr over 60 Minutes Intravenous On call to O.R. 05/13/23 0823 05/14/23 0559       Assessment/Plan: POD#9 - status post ex lap with lysis of adhesions - Dr. Luisa Hart, 05/14/2023 - Slowly getting over an ileus. Will con't softs - wean TPN off - OOB, ambulate - PT rec no f/u - Pulm toilet   FEN - soft diet . Cont gentle bowel regimen + suppository. TPN. IVF per TRH.  VTE - SCDs, okay for chem ppx from our standpoint ID - Peri-op, none currently. Afebrile. WBC wnl. Monitor  Foley - out. Voiding Plan - As above.  LOS: 13 days    Axel Filler 05/23/2023

## 2023-05-23 NOTE — Progress Notes (Signed)
PROGRESS NOTE                                                                                                                                                                                                             Patient Demographics:    Bethany [name], is a 67 y.o. female, DOB - 1956-07-08, MWN:[id number]  Outpatient Primary MD for the patient is Joselyn Arrow, MD    LOS - 13  Admit date - 05/10/2023    Chief Complaint  Patient presents with   Abdominal Pain       Brief Narrative (HPI from H&P)   67 year old with history of HLD, CPPD, alcohol/opiate abuse and remission admitted for abdominal pain.  Workup showed SBO underwent surgical correction on 05/14/2023.   Subjective:   Patient in bed denies any headache chest pain, some abdominal pain and bloating, having small BMs with suppository.   Assessment  & Plan :   SBO.  Failed conservative management seen by general surgery underwent surgical correction by Dr. Luisa Hart on 05/14/2023, having BMs starting 05/21/2023, NG tube removed by surgery on 05/18/2023, advance to soft diet on 05/22/2023 by general surgery, PICC line with TNA started on 05/15/2023 per general surgery will be stopped 05/23/2023, continue to monitor per general surgery.  Acute toxic encephalopathy caused by an excessive use of narcotics in the hospital, resolved.  Monitor  Dyslipidemia, mood disorder.  Resume home medications once taking p.o.  Anterior basement membrane dystrophy of eyes.  Continue home eyedrops  Hypokalemia and hypophosphatemia.  Replaced.  History of opioid abuse in the past.  In remission.  Monitor intake.       Condition - Fair  Family Communication  : Bedside 05/15/2023, updated over the phone [phone] 05/21/23, husband bedside 05/21/2023  Code Status : DNR  Consults  : General surgery  PUD Prophylaxis : PPI   Procedures  :     Laparotomy by Dr. Luisa Hart general surgery  on 05/14/2023 for SBO      Disposition Plan  :    Status is: Inpatient  DVT Prophylaxis  :    SCDs Start: 05/10/23 1058    Lab Results  Component Value Date   PLT 250 05/23/2023    Diet :  Diet Order             DIET SOFT Room  service appropriate? Yes; Fluid consistency: Thin  Diet effective now                    Inpatient Medications  Scheduled Meds:  acetaminophen  1,000 mg Oral Q6H   Chlorhexidine Gluconate Cloth  6 each Topical Daily   feeding supplement  237 mL Oral BID BM   gabapentin  1,200 mg Oral QHS   gabapentin  600 mg Oral Daily   lamoTRIgine  25 mg Oral Daily   pantoprazole (PROTONIX) IV  40 mg Intravenous Q24H   polyethylene glycol  17 g Oral Daily   prednisoLONE acetate  1 drop Both Eyes QID   simvastatin  20 mg Oral q1800   traZODone  50 mg Oral QHS   Continuous Infusions:  TPN ADULT (ION) 70 mL/hr at 05/22/23 1826   PRN Meds:.bisacodyl, diphenhydrAMINE **OR** diphenhydrAMINE, guaiFENesin, hydrALAZINE, HYDROmorphone (DILAUDID) injection, ipratropium-albuterol, LORazepam, methocarbamol, metoprolol tartrate, naloxone **AND** sodium chloride flush, naLOXone (NARCAN)  injection, ondansetron **OR** ondansetron (ZOFRAN) IV, ondansetron (ZOFRAN) IV, oxyCODONE, phenol, senna-docusate, simethicone, sodium chloride flush, traMADol, traZODone    Objective:   Vitals:   05/22/23 1721 05/22/23 2126 05/23/23 0300 05/23/23 0540  BP:  135/74  130/80  Pulse: 75 78    Resp:  17  18  Temp:  97.8 F (36.6 C)  97.8 F (36.6 C)  TempSrc:  Oral  Oral  SpO2: 99% 98%  97%  Weight:   57.3 kg   Height:        Wt Readings from Last 3 Encounters:  05/23/23 57.3 kg  05/05/23 52.6 kg  04/28/23 52.2 kg     Intake/Output Summary (Last 24 hours) at 05/23/2023 0929 Last data filed at 05/23/2023 0000 Gross per 24 hour  Intake 1968.97 ml  Output --  Net 1968.97 ml      Physical Exam  Awake Alert, No new F.N deficits,   .AT,PERRAL Supple Neck, No JVD,    Symmetrical Chest wall movement, Good air movement bilaterally, CTAB RRR,No Gallops,Rubs or new Murmurs,  hypoactive B.Sounds, Abd Soft, No tenderness,   No Cyanosis, Clubbing or edema        Data Review:    Recent Labs  Lab 05/17/23 0349 05/18/23 0354 05/20/23 0327 05/22/23 0320 05/23/23 0321  WBC 12.1* 9.3 7.0 8.0 8.2  HGB 11.7* 12.2 10.6* 10.0* 10.0*  HCT 35.7* 36.0 33.4* 31.4* 31.0*  PLT 176 195 215 241 250  MCV 100.0 101.4* 104.0* 104.7* 108.0*  MCH 32.8 34.4* 33.0 33.3 34.8*  MCHC 32.8 33.9 31.7 31.8 32.3  RDW 12.1 12.5 12.8 12.8 12.8  LYMPHSABS  --   --   --  1.4 1.2  MONOABS  --   --   --  0.7 0.7  EOSABS  --   --   --  0.4 0.4  BASOSABS  --   --   --  0.1 0.1    Recent Labs  Lab 05/17/23 0349 05/18/23 0354 05/19/23 0533 05/20/23 0327 05/21/23 0224 05/22/23 0320 05/23/23 0321  NA 138 137 135 137 139 137 139  K 3.8 4.0 4.3 4.0 3.9 3.9 3.7  CL 97* 102 103 104 104 102 103  CO2 28 28 27 28 30 29 31   ANIONGAP 13 7 5 5 5 6 5   GLUCOSE 130* 114* 121* 108* 98 107* 126*  BUN 10 12 13 13 14 16 16   CREATININE 0.52 0.53 0.49 0.55 0.52 0.66 0.67  AST 19 17 23 20 27  40  --  ALT 16 16 25 24 28  40  --   ALKPHOS 46 56 65 66 71 68  --   BILITOT 0.3 0.4 0.5 0.4 0.2* 0.3  --   ALBUMIN 2.8* 2.7* 2.8* 2.7* 2.9* 2.9*  --   MG 1.9 2.1 2.0 2.1  --   --   --   CALCIUM 8.8* 8.7* 8.5* 8.4* 8.7* 8.7* 8.4*     Radiology Reports No results found.    Signature  -   Susa Raring M.D on 05/23/2023 at 9:29 AM   -  To page go to [email or web address]

## 2023-05-24 ENCOUNTER — Other Ambulatory Visit (HOSPITAL_COMMUNITY): Payer: Self-pay

## 2023-05-24 DIAGNOSIS — K56609 Unspecified intestinal obstruction, unspecified as to partial versus complete obstruction: Secondary | ICD-10-CM | POA: Diagnosis not present

## 2023-05-24 LAB — CBC WITH DIFFERENTIAL/PLATELET
Abs Immature Granulocytes: 0.09 10*3/uL — ABNORMAL HIGH (ref 0.00–0.07)
Basophils Absolute: 0.1 10*3/uL (ref 0.0–0.1)
Basophils Relative: 1 %
Eosinophils Absolute: 0.4 10*3/uL (ref 0.0–0.5)
Eosinophils Relative: 5 %
HCT: 31 % — ABNORMAL LOW (ref 36.0–46.0)
Hemoglobin: 9.8 g/dL — ABNORMAL LOW (ref 12.0–15.0)
Immature Granulocytes: 1 %
Lymphocytes Relative: 17 %
Lymphs Abs: 1.3 10*3/uL (ref 0.7–4.0)
MCH: 33.1 pg (ref 26.0–34.0)
MCHC: 31.6 g/dL (ref 30.0–36.0)
MCV: 104.7 fL — ABNORMAL HIGH (ref 80.0–100.0)
Monocytes Absolute: 0.7 10*3/uL (ref 0.1–1.0)
Monocytes Relative: 9 %
Neutro Abs: 5.3 10*3/uL (ref 1.7–7.7)
Neutrophils Relative %: 67 %
Platelets: 280 10*3/uL (ref 150–400)
RBC: 2.96 MIL/uL — ABNORMAL LOW (ref 3.87–5.11)
RDW: 12.9 % (ref 11.5–15.5)
WBC: 7.9 10*3/uL (ref 4.0–10.5)
nRBC: 0 % (ref 0.0–0.2)

## 2023-05-24 LAB — PHOSPHORUS: Phosphorus: 4.5 mg/dL (ref 2.5–4.6)

## 2023-05-24 LAB — BASIC METABOLIC PANEL WITH GFR
Anion gap: 8 (ref 5–15)
BUN: 14 mg/dL (ref 8–23)
CO2: 33 mmol/L — ABNORMAL HIGH (ref 22–32)
Calcium: 8.9 mg/dL (ref 8.9–10.3)
Chloride: 99 mmol/L (ref 98–111)
Creatinine, Ser: 0.7 mg/dL (ref 0.44–1.00)
GFR, Estimated: >60 mL/min
Glucose, Bld: 121 mg/dL — ABNORMAL HIGH (ref 70–99)
Potassium: 3.8 mmol/L (ref 3.5–5.1)
Sodium: 140 mmol/L (ref 135–145)

## 2023-05-24 LAB — MAGNESIUM: Magnesium: 2 mg/dL (ref 1.7–2.4)

## 2023-05-24 MED ORDER — BOOST / RESOURCE BREEZE PO LIQD CUSTOM: 1.0000 | Freq: Three times a day (TID) | ORAL | Status: DC

## 2023-05-24 MED ORDER — DOCUSATE SODIUM 100 MG PO CAPS
200.0000 mg | ORAL_CAPSULE | Freq: Two times a day (BID) | ORAL | 0 refills | Status: AC
  Filled 2023-05-24: qty 100, 25d supply, fill #0

## 2023-05-24 MED ORDER — BISACODYL 10 MG RE SUPP
10.0000 mg | Freq: Every day | RECTAL | 0 refills | Status: AC | PRN
Start: 2023-05-24 — End: ?
  Filled 2023-05-24: qty 12, 12d supply, fill #0

## 2023-05-24 MED ORDER — OXYCODONE HCL 5 MG PO TABS
5.0000 mg | ORAL_TABLET | Freq: Two times a day (BID) | ORAL | 0 refills | Status: DC | PRN
  Filled 2023-05-24: qty 12, 6d supply, fill #0

## 2023-05-24 MED ORDER — PANTOPRAZOLE SODIUM 40 MG PO TBEC
40.0000 mg | DELAYED_RELEASE_TABLET | Freq: Every day | ORAL | 0 refills | Status: DC
  Filled 2023-05-24: qty 30, 30d supply, fill #0

## 2023-05-24 MED ORDER — SIMETHICONE 80 MG PO CHEW
80.0000 mg | CHEWABLE_TABLET | Freq: Four times a day (QID) | ORAL | 0 refills | Status: AC | PRN
  Filled 2023-05-24: qty 100, 25d supply, fill #0

## 2023-05-24 MED ORDER — ONDANSETRON 4 MG PO TBDP
4.0000 mg | ORAL_TABLET | Freq: Four times a day (QID) | ORAL | 0 refills | Status: AC | PRN
  Filled 2023-05-24: qty 20, 5d supply, fill #0

## 2023-05-24 MED ORDER — TRAMADOL HCL 50 MG PO TABS
50.0000 mg | ORAL_TABLET | Freq: Four times a day (QID) | ORAL | 0 refills | Status: DC | PRN
  Filled 2023-05-24: qty 20, 5d supply, fill #0

## 2023-05-24 MED ORDER — POLYETHYLENE GLYCOL 3350 17 G PO PACK: 17.0000 g | PACK | Freq: Every day | ORAL | 0 refills | Status: DC

## 2023-05-24 NOTE — Progress Notes (Signed)
Pt's primary nurse reviewed discharge instructions, pt's daughter is here and pt ready to go. Volunteer called and will take pt to main entrance. Daughter took pt's belongings with her, [facility] filled scripts and pt has the meds with her.   Erbie Arment,RN SWOT

## 2023-05-24 NOTE — Progress Notes (Signed)
RA DL PICC removed per protocol per MD order. Manual pressure applied for 5 mins. Vaseline gauze, gauze, and Tegaderm applied over insertion site. No bleeding or swelling noted. Instructed patient to remain in bed for thirty mins. Educated patient about S/S of infection and when to call MD; no heavy lifting or pressure on right side for 24 hours; keep dressing dry and intact for 24 hours. Pt verbalized comprehension. 

## 2023-05-24 NOTE — Progress Notes (Signed)
10 Days Post-Op   Subjective/Chief Complaint: CC: Still having incisional pain that is worse with movement and improved at rest. Better from when I last saw her on Friday. Tolerating diet and eating ~1/2 of her trays + drinking shakes. No n/v. Passing flatus. Formed bm today. Voiding without issues. Mobilizing in the halls.   Afebrile. No tachycardia or hypotension.WBC wnl. Hgb stable. Cr wnl. K 3.8. Not requiring IV pain medication.   Would like to go home today if possible.   Objective: Vital signs in last 24 hours: Temp:  [98 F (36.7 C)-98.1 F (36.7 C)] 98 F (36.7 C) (07/22 0324) Pulse Rate:  [76-84] 84 (07/22 0324) Resp:  [18] 18 (07/22 0324) BP: (119-151)/(64-73) 151/73 (07/22 0324) SpO2:  [97 %-98 %] 97 % (07/22 0324) Last BM Date : 05/24/23  Intake/Output from previous day: 07/21 0701 - 07/22 0700 In: 1200 [P.O.:1200] Out: -  Intake/Output this shift: No intake/output data recorded.  PE: Gen:  Alert, NAD, pleasant Abd: Soft, mild distension that is improved from last week. Some generalized ttp, most around her incision. No rigidity or guarding. + BS. Midline wound with some reactive erythema but is otherwise cdi with staples in place  Lab Results:  Recent Labs    05/23/23 0321 05/24/23 0259  WBC 8.2 7.9  HGB 10.0* 9.8*  HCT 31.0* 31.0*  PLT 250 280   BMET Recent Labs    05/23/23 0321 05/24/23 0259  NA 139 140  K 3.7 3.8  CL 103 99  CO2 31 33*  GLUCOSE 126* 121*  BUN 16 14  CREATININE 0.67 0.70  CALCIUM 8.4* 8.9   PT/INR No results for input(s): "LABPROT", "INR" in the last 72 hours. ABG No results for input(s): "PHART", "HCO3" in the last 72 hours.  Invalid input(s): "PCO2", "PO2"  Studies/Results: No results found.  Anti-infectives: Anti-infectives (From admission, onward)    Start     Dose/Rate Route Frequency Ordered Stop   05/14/23 1230  cefTRIAXone (ROCEPHIN) 2 g in sodium chloride 0.9 % 100 mL IVPB       Note to  Pharmacy: Pharmacy may adjust dosing strength / duration / interval for maximal efficacy   2 g 200 mL/hr over 30 Minutes Intravenous On call to O.R. 05/14/23 1218 05/15/23 0559   05/14/23 1230  metroNIDAZOLE (FLAGYL) IVPB 500 mg        500 mg 100 mL/hr over 60 Minutes Intravenous On call to O.R. 05/14/23 1218 05/15/23 0559   05/13/23 0915  cefTRIAXone (ROCEPHIN) 2 g in sodium chloride 0.9 % 100 mL IVPB       Note to Pharmacy: Pharmacy may adjust dosing strength / duration / interval for maximal efficacy   2 g 200 mL/hr over 30 Minutes Intravenous On call to O.R. 05/13/23 6962 05/14/23 0559   05/13/23 0915  metroNIDAZOLE (FLAGYL) IVPB 500 mg        500 mg 100 mL/hr over 60 Minutes Intravenous On call to O.R. 05/13/23 0823 05/14/23 0559       Assessment/Plan: POD#10 - status post ex lap with lysis of adhesions - Dr. Luisa Hart, 05/14/2023 - Tolerating soft diet and having bowel function. Off TPN.  - OOB, ambulate - PT rec no f/u - Pulm toilet - Off IV pain medications - She appears stable for d/c home. Discussed discharge instructions, restrictions and return/call back precautions. Will arrange f/u (staple removal on POD 14 and with her surgeon) and send pain medications to  her pharmacy. Will send message to [facility] when this is all arranged.   FEN - Soft diet. Cont shakes. Cont bowel regimen. Off TPN  VTE - SCDs, okay for chem ppx from our standpoint ID - Peri-op, none currently. Afebrile. WBC wnl. Monitor  Foley - out. Voiding Plan - As above.   LOS: 14 days    Elmer Sow [facility] 05/24/2023

## 2023-05-24 NOTE — Discharge Summary (Addendum)
Bethany [name] ZOX:[id number] DOB: August 21, 1956 DOA: 05/10/2023  PCP: Joselyn Arrow, MD  Admit date: 05/10/2023  Discharge date: 05/24/2023  Admitted From: Home   Disposition:  Home   Recommendations for Outpatient Follow-up:   Follow up with PCP in 1-2 weeks  PCP Please obtain BMP/CBC, 2 view CXR in 1week,  (see Discharge instructions)   PCP Please follow up on the following pending results:    Home Health: None   Equipment/Devices: None  Consultations: CCS Discharge Condition: Stable    CODE STATUS: Full    Diet Recommendation: Heart Healthy     Chief Complaint  Patient presents with   Abdominal Pain     Brief history of present illness from the day of admission and additional interim summary     67 year old with history of HLD, CPPD, alcohol/opiate abuse and remission admitted for abdominal pain.  Workup showed SBO underwent surgical correction on 05/14/2023.                                                                  Hospital Course   SBO.  Failed conservative management seen by general surgery underwent surgical correction by Dr. Luisa Hart on 05/14/2023, having BMs starting 05/21/2023, NG tube removed by surgery on 05/18/2023, advance to soft diet on 05/22/2023 by general surgery, PICC line with TNA started on 05/15/2023 per general surgery and was eventually stopped 05/23/2023, now having regular bowel movements, will be discharged home, placed on bowel regimen, try to minimize narcotics as much as possible, discussed with general surgery.  Postop follow-up with general surgery in a week.    Acute toxic encephalopathy caused by an excessive use of narcotics in the hospital, resolved.  Monitor   Dyslipidemia, mood disorder.  Resume home medications once taking p.o.   Anterior basement membrane dystrophy of  eyes.  Continue home eyedrops   Hypokalemia and hypophosphatemia.  Replaced.   History of opioid abuse in the past.  In remission.  Monitor intake.  PCP to continue to monitor.   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  Get CBC, CMP, magnesium, phosphorus -  checked next visit with your primary MD   Activity: As tolerated with Full fall precautions use walker/cane & assistance as needed  Disposition Home    Diet: Heart Healthy    Special Instructions: If you have smoked  or chewed Tobacco  in the last 2 yrs please stop smoking, stop any regular Alcohol  and or any Recreational drug use.  On your next visit with your primary care physician please Get Medicines reviewed and adjusted.  Please request your Prim.MD to go over all Hospital Tests and Procedure/Radiological results at the follow up, please get all Hospital records sent to your Prim MD by signing hospital release before you go home.  If you experience worsening of your admission symptoms, develop shortness of breath, life threatening emergency, suicidal or homicidal thoughts you must seek medical attention immediately by calling 911 or calling your MD immediately  if symptoms less severe.  You Must read complete instructions/literature along with all the possible adverse reactions/side effects for all the Medicines you take and that have been prescribed to you. Take any new Medicines after you have completely understood and accpet all the possible adverse reactions/side effects.   Do not drive when taking Pain medications.  Do not take more than  prescribed Pain, Sleep and Anxiety Medications  Wear Seat belts while driving.   Please note  You were cared for by a hospitalist during your hospital stay. If you have any questions about your discharge medications or the care you received while you were in the hospital after you are discharged, you can call the unit and asked to speak with the hospitalist on call if the hospitalist that took care of you is not available. Once you are discharged, your primary care physician will handle any further medical issues. Please note that NO REFILLS for any discharge medications will be authorized once you are discharged, as it is imperative that you return to your primary care physician (or establish a relationship with a primary care physician if you do not have one) for your aftercare needs so that they can reassess your need for medications and monitor your lab values.   Increase activity slowly   Complete by: As directed        Discharge Medications   Allergies as of 05/24/2023       Reactions   Buprenorphine Hcl Nausea And Vomiting   Morphine And Codeine Nausea Only        Medication List     TAKE these medications    acetaminophen 500 MG tablet Commonly known as: TYLENOL Take 1,000 mg by mouth every 6 (six) hours as needed for moderate pain.   ADULT GUMMY PO Take 1 each by mouth at bedtime. Vitamin B12 and Vitamin D gummy   ascorbic acid 500 MG tablet Commonly known as: VITAMIN C Take 500 mg by mouth every evening.   bisacodyl 5 MG EC tablet Commonly known as: DULCOLAX Take 1 tablet (5 mg total) by mouth daily as needed for moderate constipation. What changed: Another medication with the same name was added. Make sure you understand how and when to take each.   bisacodyl 10 MG suppository Commonly known as: DULCOLAX Place 1 suppository (10 mg total) rectally daily as needed for moderate constipation. What changed: You were already taking a medication with the same name,  and this prescription was added. Make sure you understand how and when to take each.   docusate sodium 100 MG capsule Commonly known as: COLACE Take 2 capsules (200 mg total) by mouth 2 (two) times daily.   gabapentin 600 MG tablet Commonly known as: NEURONTIN Take 600-1,200 mg by mouth See admin instructions. Take 600 mg by mouth in the morning and 1200 mg at  night   hydrocortisone 2.5 % cream Apply 1 Application topically 2 (two) times daily as needed (hemorrhoids).   ibuprofen 800 MG tablet Commonly known as: ADVIL TAKE 1 TABLET(800 MG) BY MOUTH EVERY 8 HOURS AS NEEDED What changed: See the new instructions.   lamoTRIgine 100 MG tablet Commonly known as: LAMICTAL Take 100 mg by mouth 2 (two) times daily.   MAGNESIUM CITRATE PO Take 250 mg by mouth daily.   Movantik 12.5 MG Tabs tablet Generic drug: naloxegol oxalate TAKE 1 TABLET(12.5MG ) BY MOUTH EVERY DAY   ondansetron 4 MG disintegrating tablet Commonly known as: ZOFRAN-ODT Take 1 tablet (4 mg total) by mouth every 6 (six) hours as needed for nausea.   oxyCODONE 5 MG immediate release tablet Commonly known as: Oxy IR/ROXICODONE Take 1 tablet (5 mg total) by mouth every 12 (twelve) hours as needed for severe pain or breakthrough pain.   pantoprazole 40 MG tablet Commonly known as: Protonix Take 1 tablet (40 mg total) by mouth daily.   polyethylene glycol 17 g packet Commonly known as: MiraLax Take 17 g by mouth daily. What changed: Another medication with the same name was added. Make sure you understand how and when to take each.   polyethylene glycol 17 g packet Commonly known as: MiraLax Take 17 g by mouth daily. What changed: You were already taking a medication with the same name, and this prescription was added. Make sure you understand how and when to take each.   prednisoLONE acetate 1 % ophthalmic suspension Commonly known as: PRED FORTE Place 1 drop into both eyes 4 (four) times daily.    simethicone 80 MG chewable tablet Commonly known as: MYLICON Chew 1 tablet (80 mg total) by mouth every 6 (six) hours as needed for flatulence.   simvastatin 20 MG tablet Commonly known as: ZOCOR TAKE 1 TABLET(20 MG) BY MOUTH DAILY   traMADol 50 MG tablet Commonly known as: ULTRAM Take 1 tablet (50 mg total) by mouth every 6 (six) hours as needed for moderate pain.   traZODone 50 MG tablet Commonly known as: DESYREL Take 50 mg by mouth at bedtime.   Vevye 0.1 % Soln Generic drug: cycloSPORINE Apply 1 drop to eye 2 (two) times daily.   Xdemvy 0.25 % Soln Generic drug: Lotilaner Place 1 drop into both eyes in the morning and at bedtime.   zinc gluconate 50 MG tablet Take 50 mg by mouth every evening.         Follow-up Information     Joselyn Arrow, MD. Schedule an appointment as soon as possible for a visit in 1 week(s).   Specialty: Family Medicine Contact information: [address] [place] [address] [phone]         [facility], [place]. Schedule an appointment as soon as possible for a visit in 1 week(s).   Specialty: General Surgery Contact information: [address] [place] [address] [phone]                Major procedures and Radiology Reports - PLEASE review detailed and final reports thoroughly  -        [place] EKG SITE RITE  Result Date: 05/14/2023 If Site Rite image not attached, placement could not be confirmed due to current cardiac rhythm.  DG Abd Portable 1V  Result Date: 05/13/2023 CLINICAL DATA:  [id number] SBO (small bowel obstruction) (HCC) [id number] EXAM: PORTABLE ABDOMEN - 1 VIEW COMPARISON:  05/12/23 Abdominal radiograph FINDINGS: Paucity of small  bowel gas limits the ability to assess for obstruction. There is a dilated loop of bowel that projects over the lower pelvis. Persistent enteric contrast material projects over the pelvis. Enteric tube with side hole above the level  of the GE junction, recommend advancement lung bases are clear. No acute osseous abnormality IMPRESSION: 1. Overall paucity of small bowel gas limits the ability to assess for obstruction. There is a dilated loop of bowel that projects over the lower pelvis. 2. Enteric tube with side hole above the level of the GE junction, recommend advancement. Electronically Signed   By: Lorenza Cambridge M.D.   On: 05/13/2023 08:41   DG Abd Portable 1V  Result Date: 05/12/2023 CLINICAL DATA:  Abdominal pain, follow up small bowel obstruction EXAM: PORTABLE ABDOMEN - 1 VIEW COMPARISON:  05/11/2023 FINDINGS: Gastric catheter is again noted within the stomach. Scattered large and small bowel gas is noted. Previously seen small bowel dilatation has resolved. Contrast is noted within the colon. No free air is seen. Postsurgical changes are again noted. IMPRESSION: Previously seen small bowel dilatation has resolved. Electronically Signed   By: Alcide Clever M.D.   On: 05/12/2023 11:18   DG Abd Portable 1V  Result Date: 05/11/2023 CLINICAL DATA:  Small bowel obstruction EXAM: PORTABLE ABDOMEN - 1 VIEW COMPARISON:  Earlier today FINDINGS: An enteric tube tip and side-port overlaps the stomach. Dilated small bowel loop in the pelvis. Adjacent contrast is presumably within a neighboring small bowel. No colonic contrast is noted. IMPRESSION: Persisting small bowel obstruction with dilated loops in the pelvis. Adjacent contrast is likely within a small bowel loop, no generalized colonic contrast. Electronically Signed   By: Tiburcio Pea M.D.   On: 05/11/2023 16:31   DG Abd Portable 1V  Result Date: 05/11/2023 CLINICAL DATA:  [id number] Encounter for nasogastric (NG) tube placement [id number] EXAM: PORTABLE ABDOMEN - 1 VIEW COMPARISON:  Radiograph from yesterday. FINDINGS: Interval placement of enteric tube with its tip and side hole overlying the left upper quadrant, within the proximal stomach. The bowel gas pattern is non-obstructive. No  evidence of pneumoperitoneum. No acute osseous abnormalities. The soft tissues are within normal limits. Bilateral breast implants noted. Partially seen lumbar spinal fixation hardware. There are multiple monitoring electrodes overlying the chest and upper abdomen. IMPRESSION: *Enteric tube tip and side hole overlying the proximal stomach. Electronically Signed   By: Jules Schick M.D.   On: 05/11/2023 08:33   DG Abd Portable 1V-Small Bowel Obstruction Protocol-initial, 8 hr delay  Result Date: 05/10/2023 CLINICAL DATA:  Small bowel obstruction, 8 hour delay EXAM: PORTABLE ABDOMEN - 1 VIEW COMPARISON:  CT 05/10/2023 FINDINGS: Contrast present within the stomach and dilated loops of small bowel. No definite contrast is present within the colon. Hardware in the lower spine. IMPRESSION: Contrast present within the stomach and dilated loops of small bowel. No definite contrast is present within the colon. Electronically Signed   By: Jasmine Pang M.D.   On: 05/10/2023 21:21   DG CHEST PORT 1 VIEW  Result Date: 05/10/2023 CLINICAL DATA:  [id number] Aspiration into airway, initial encounter [id number] EXAM: PORTABLE CHEST 1 VIEW COMPARISON:  11/17/2022 FINDINGS: Heart and mediastinal contours are within normal limits. No focal opacities or effusions. No acute bony abnormality. IMPRESSION: No active disease. Electronically Signed   By: Charlett Nose M.D.   On: 05/10/2023 19:10   CT ABDOMEN PELVIS W CONTRAST  Result Date: 05/10/2023 CLINICAL DATA:  67 year old female with history of acute onset of nonlocalized abdominal  pain. Prior history of small-bowel obstruction. EXAM: CT ABDOMEN AND PELVIS WITH CONTRAST TECHNIQUE: Multidetector CT imaging of the abdomen and pelvis was performed using the standard protocol following bolus administration of intravenous contrast. RADIATION DOSE REDUCTION: This exam was performed according to the departmental dose-optimization program which includes automated exposure control,  adjustment of the mA and/or kV according to patient size and/or use of iterative reconstruction technique. CONTRAST:  75mL OMNIPAQUE IOHEXOL 300 MG/ML  SOLN COMPARISON:  CT of the abdomen and pelvis 06/16/2022. FINDINGS: Lower chest: Bilateral breast implants with dense capsular calcifications incidentally noted. Hepatobiliary: No suspicious cystic or solid hepatic lesions. No intra or extrahepatic biliary ductal dilatation. Gallbladder is unremarkable in appearance. Pancreas: No pancreatic mass. No pancreatic ductal dilatation. No pancreatic or peripancreatic fluid collections or inflammatory changes. Spleen: Unremarkable. Adrenals/Urinary Tract: 2 mm nonobstructive calculus in the lower pole collecting system of the left kidney. Bilateral kidneys and adrenal glands are otherwise normal in appearance. No hydroureteronephrosis. Urinary bladder is nearly completely decompressed, but otherwise unremarkable in appearance. Stomach/Bowel: The appearance of the stomach is normal. Suture line in the right lower quadrant of the abdomen, likely from partial small bowel resection. In the left lower quadrant (axial image 53 of series 2) there is a dilated loop of small bowel measuring up to 4 cm in diameter with internal air-fluid levels. No generalized small bowel dilatation is otherwise noted. Colon appears relatively decompressed, although there is some gas and stool throughout the sigmoid colon. Appendix is not confidently identified. Vascular/Lymphatic: Atherosclerotic calcifications in the abdominal aorta. No aneurysm or dissection noted in the abdominal or pelvic vasculature. No lymphadenopathy noted in the abdomen or pelvis. Reproductive: Status post hysterectomy. Ovaries are not confidently identified may be surgically absent or atrophic. Other: No significant volume of ascites.  No pneumoperitoneum. Musculoskeletal: There are no aggressive appearing lytic or blastic lesions noted in the visualized portions of the  skeleton. Postoperative changes of PLIF at L4-L5 with interbody cage at the L4-L5 interspace and 8 mm of anterolisthesis of L4 upon L5 noted. IMPRESSION: 1. Dilated small bowel loop in the left lower quadrant of the abdomen, with air-fluid level in this region. No proximal dilatation of small bowel or stomach otherwise noted. However, the possibility of earlier partial small bowel obstruction should be considered, potentially from adhesions. Surgical consultation is recommended. 2. 2 mm nonobstructive calculus in the lower pole collecting system of the left kidney. 3. Additional postoperative changes and incidental findings, as above. Electronically Signed   By: Trudie Reed M.D.   On: 05/10/2023 06:46    Micro Results    No results found for this or any previous visit (from the past 240 hour(s)).  Today   Subjective    Bethany [name] today has no headache,no chest abdominal pain,no new weakness tingling or numbness, feels much better wants to go home today.    Objective   Blood pressure (!) 151/73, pulse 84, temperature 98 F (36.7 C), temperature source Oral, resp. rate 18, height 5\' 5"  (1.651 m), weight 57.3 kg, SpO2 97%.   Intake/Output Summary (Last 24 hours) at 05/24/2023 1009 Last data filed at 05/24/2023 0500 Gross per 24 hour  Intake 1200 ml  Output --  Net 1200 ml    Exam  Awake Alert, No new F.N deficits,    [place].AT,PERRAL Supple Neck,   Symmetrical Chest wall movement, Good air movement bilaterally, CTAB RRR,No Gallops,   +ve B.Sounds, Abd Soft, Non tender,  No Cyanosis, Clubbing or edema  Data Review   Recent Labs  Lab 05/18/23 0354 05/20/23 0327 05/22/23 0320 05/23/23 0321 05/24/23 0259  WBC 9.3 7.0 8.0 8.2 7.9  HGB 12.2 10.6* 10.0* 10.0* 9.8*  HCT 36.0 33.4* 31.4* 31.0* 31.0*  PLT 195 215 241 250 280  MCV 101.4* 104.0* 104.7* 108.0* 104.7*  MCH 34.4* 33.0 33.3 34.8* 33.1  MCHC 33.9 31.7 31.8 32.3 31.6  RDW 12.5 12.8 12.8 12.8 12.9  LYMPHSABS  --    --  1.4 1.2 1.3  MONOABS  --   --  0.7 0.7 0.7  EOSABS  --   --  0.4 0.4 0.4  BASOSABS  --   --  0.1 0.1 0.1    Recent Labs  Lab 05/18/23 0354 05/19/23 0533 05/20/23 0327 05/21/23 0224 05/22/23 0320 05/23/23 0321 05/24/23 0259  NA 137 135 137 139 137 139 140  K 4.0 4.3 4.0 3.9 3.9 3.7 3.8  CL 102 103 104 104 102 103 99  CO2 28 27 28 30 29 31  33*  ANIONGAP 7 5 5 5 6 5 8   GLUCOSE 114* 121* 108* 98 107* 126* 121*  BUN 12 13 13 14 16 16 14   CREATININE 0.53 0.49 0.55 0.52 0.66 0.67 0.70  AST 17 23 20 27  40  --   --   ALT 16 25 24 28  40  --   --   ALKPHOS 56 65 66 71 68  --   --   BILITOT 0.4 0.5 0.4 0.2* 0.3  --   --   ALBUMIN 2.7* 2.8* 2.7* 2.9* 2.9*  --   --   MG 2.1 2.0 2.1  --   --   --  2.0  CALCIUM 8.7* 8.5* 8.4* 8.7* 8.7* 8.4* 8.9    Total Time in preparing paper work, data evaluation and todays exam - 35 minutes  Signature  -    Susa Raring M.D on 05/24/2023 at 10:09 AM   -  To page go to [email or web address]

## 2023-05-25 ENCOUNTER — Telehealth: Payer: Self-pay | Admitting: *Deleted

## 2023-05-25 NOTE — Transitions of Care (Post Inpatient/ED Visit) (Signed)
05/25/2023  Name: Bethany [name] MRN: [id number] DOB: [birth year]  Today's TOC FU Call Status: Today's TOC FU Call Status:: Successful TOC FU Call Competed TOC FU Call Complete Date: 05/25/23  Transition Care Management Follow-up Telephone Call Date of Discharge: 05/24/23 Discharge Facility: [facility]) Type of Discharge: Inpatient Admission Primary Inpatient Discharge Diagnosis:: SBO How have you been since you were released from the hospital?: Better Any questions or concerns?: No  Items Reviewed: Did you receive and understand the discharge instructions provided?: Yes Medications obtained,verified, and reconciled?: Yes (Medications Reviewed) Any new allergies since your discharge?: No Dietary orders reviewed?: Yes Type of Diet Ordered:: heart healthy Do you have support at home?: Yes People in Home: spouse Name of Support/Comfort Primary Source: Lu Duffel  Medications Reviewed Today: Medications Reviewed Today     Reviewed by Luella Cook, RN (Case Manager) on 05/25/23 at 1243  Med List Status: <None>   Medication Order Taking? Sig Documenting Provider Last Dose Status Informant  acetaminophen (TYLENOL) 500 MG tablet [id number] Yes Take 1,000 mg by mouth every 6 (six) hours as needed for moderate pain. [provider] Taking Active Self           Med Note Sofie Rower May 10, 2023 12:09 PM) Takes about 4x/week  bisacodyl (DULCOLAX) 10 MG suppository [id number] Yes Unwrap and place 1 suppository (10 mg total) rectally daily as needed for moderate constipation. Leroy Sea, MD Taking Active   bisacodyl (DULCOLAX) 5 MG EC tablet [id number] Yes Take 1 tablet (5 mg total) by mouth daily as needed for moderate constipation. Elodia Florence, PA-C Taking Active Self           Med Note Sofie Rower May 10, 2023 12:09 PM) Took 4x last week  docusate sodium (COLACE) 100 MG capsule [id number] Yes Take 2 capsules (200 mg total) by mouth 2 (two) times daily  for 8 days then as directed by MD Leroy Sea, MD Taking Active   gabapentin (NEURONTIN) 600 MG tablet [id number] Yes Take 600-1,200 mg by mouth See admin instructions. Take 600 mg by mouth in the morning and 1200 mg at night [provider] Taking Active Self, Pharmacy Records  hydrocortisone 2.5 % cream [id number] Yes Apply 1 Application topically 2 (two) times daily as needed (hemorrhoids). [provider] Taking Active Self           Med Note Katrinka Blazing, Corliss Blacker Apr 28, 2023  1:31 PM) As needed  ibuprofen (ADVIL) 800 MG tablet [id number] Yes TAKE 1 TABLET(800 MG) BY MOUTH EVERY 8 HOURS AS NEEDED  Patient taking differently: Take 800 mg by mouth daily. Take one tablet daily and as needed   Ronnald Nian, MD Taking Active Self           Med Note Colon Branch Apr 28, 2023  1:31 PM) Takes one every am  lamoTRIgine (LAMICTAL) 100 MG tablet [id number] Yes Take 100 mg by mouth 2 (two) times daily.  [provider] Taking Active Self, Pharmacy Records           Med Note Osa Craver   Tue Mar 02, 2017  9:09 AM)    Nena Jordan (XDEMVY) 0.25 % SOLN [id number] Yes Place 1 drop into both eyes in the morning and at bedtime. [provider] Taking Active   MAGNESIUM CITRATE PO [id number] Yes Take 250 mg by mouth daily. [provider]  Taking Active Self  MOVANTIK 12.5 MG TABS tablet [id number] Yes TAKE 1 TABLET(12.5MG ) BY MOUTH EVERY DAY Nandigam, Eleonore Chiquito, MD Taking Active Self, Pharmacy Records  Multiple Vitamins-Minerals (ADULT GUMMY PO) [id number] Yes Take 1 each by mouth at bedtime. Vitamin B12 and Vitamin D gummy [provider] Taking Active Self  ondansetron (ZOFRAN-ODT) 4 MG disintegrating tablet [id number] Yes Take 1 tablet (4 mg total) by mouth every 6 (six) hours as needed for nausea. Leroy Sea, MD Taking Active   oxyCODONE (OXY IR/ROXICODONE) 5 MG immediate release tablet [id number] Yes Take 1 tablet (5 mg total) by  mouth every 12 (twelve) hours as needed for severe pain or breakthrough pain. Leroy Sea, MD Taking Active   pantoprazole (PROTONIX) 40 MG tablet [id number] Yes Take 1 tablet (40 mg total) by mouth daily. Leroy Sea, MD Taking Active   polyethylene glycol (MIRALAX) 17 g packet [id number] Yes Take 17 g by mouth daily. Erick Blinks, MD Taking Active Self           Med Note Alphia Moh D   Mon May 10, 2023 12:12 PM)    polyethylene glycol (MIRALAX) 17 g packet [id number] Yes Take 17 g by mouth daily. Leroy Sea, MD Taking Active   prednisoLONE acetate (PRED FORTE) 1 % ophthalmic suspension [id number] Yes Place 1 drop into both eyes 4 (four) times daily. [provider] Taking Active Self, Pharmacy Records  simethicone (MYLICON) 80 MG chewable tablet [id number] Yes Chew 1 tablet (80 mg total) by mouth every 6 (six) hours as needed for flatulence. Leroy Sea, MD Taking Active   simvastatin (ZOCOR) 20 MG tablet [id number] Yes TAKE 1 TABLET(20 MG) BY MOUTH DAILY Joselyn Arrow, MD Taking Active Self, Pharmacy Records  traMADol (ULTRAM) 50 MG tablet [id number] Yes Take 1 tablet (50 mg total) by mouth every 6 (six) hours as needed for moderate pain. Leroy Sea, MD Taking Active   traZODone (DESYREL) 50 MG tablet [id number] Yes Take 50 mg by mouth at bedtime. [provider] Taking Active Self, Pharmacy Records  VEVYE 0.1 % SOLN [id number] Yes Apply 1 drop to eye 2 (two) times daily. [provider] Taking Active Self, Pharmacy Records  vitamin C (ASCORBIC ACID) 500 MG tablet [id number] Yes Take 500 mg by mouth every evening. [provider] Taking Active Self  zinc gluconate 50 MG tablet [id number] Yes Take 50 mg by mouth every evening. [provider] Taking Active Self            Home Care and Equipment/Supplies: Were Home Health Services Ordered?: NA Any new equipment or medical supplies ordered?: NA  Functional Questionnaire: Do  you need assistance with bathing/showering or dressing?: No Do you need assistance with meal preparation?: Yes Do you need assistance with eating?: No Do you have difficulty maintaining continence: No Do you need assistance with getting out of bed/getting out of a chair/moving?: No Do you have difficulty managing or taking your medications?: No  Follow up appointments reviewed: PCP Follow-up appointment confirmed?: Yes Date of PCP follow-up appointment?: 06/02/23 Follow-up Provider: Joselyn Arrow Specialist [facility] Follow-up appointment confirmed?: Yes Date of Specialist follow-up appointment?: 05/28/23 Follow-Up Specialty Provider:: [facility] Do you need transportation to your follow-up appointment?: No Do you understand care options if your condition(s) worsen?: Yes-patient verbalized understanding  SDOH Interventions Today    Flowsheet Row Most Recent Value  SDOH Interventions   Food Insecurity Interventions Intervention Not Indicated  Housing Interventions Intervention Not Indicated  Transportation Interventions Intervention Not Indicated, Patient Resources (Friends/Family)      Interventions Today    Flowsheet Row Most Recent Value  General Interventions   General Interventions Discussed/Reviewed General Interventions Discussed, General Interventions Reviewed  Nutrition Interventions   Nutrition Discussed/Reviewed Nutrition Discussed  Pharmacy Interventions   Pharmacy Dicussed/Reviewed Pharmacy Topics Discussed  Safety Interventions   Safety Discussed/Reviewed Home Safety  Home Safety Assistive Devices  [Per patient she is not needing any assistive devices]      TOC Interventions Today    Flowsheet Row Most Recent Value  TOC Interventions   TOC Interventions Discussed/Reviewed TOC Interventions Discussed, TOC Interventions Reviewed, Arranged PCP follow up less than 12 days/Care Guide scheduled      Interventions Today    Flowsheet Row Most Recent Value   General Interventions   General Interventions Discussed/Reviewed General Interventions Discussed, General Interventions Reviewed  Nutrition Interventions   Nutrition Discussed/Reviewed Nutrition Discussed  Pharmacy Interventions   Pharmacy Dicussed/Reviewed Pharmacy Topics Discussed  Safety Interventions   Safety Discussed/Reviewed Home Safety  Home Safety Assistive Devices  [Per patient she is not needing any assistive devices]      TOC Interventions Today    Flowsheet Row Most Recent Value  TOC Interventions   TOC Interventions Discussed/Reviewed TOC Interventions Discussed, TOC Interventions Reviewed, Arranged PCP follow up less than 12 days/Care Guide scheduled       Gean Maidens BSN RN [facility] [phone]

## 2023-06-01 NOTE — Progress Notes (Addendum)
Chief Complaint  Patient presents with   Hospitalization Follow-up    Patient is here for hospital follow up. She is feeling better pain wise, nut mentally is not doing well. [id number]   Patient presents for hospital follow-up from small bowel obstruction, s/p surgery. She reports feeling depressed, "I feel like crap"--"messed up in the head". She has an appointment with her psychiatrist tomorrow. Discharged with oxycodone and tramadol, took as directed and completed them. She was only given 5 days worth of meds.  Husband and daughter doled them out, none left at home. She wonders if some of how she feels is related to withdrawal from the pain meds. Denies suicidal thoughts/ideation.  She is also worried about her memory. Saw neuro in the past for this. Reports this has gotten worse, but not acutely, noticed it prior to her hospitalization.  Discussed at recent wellness visit, reported "age related", had mini-Cog of 5. She had been taking vitamin B12, but has not taken this since hospitalization.  Also having R knee pain, after being in hospital in bed for so long. Has appt with Terrilee Files next week.  She was hospitalized 7/8-7/22/24 with small bowel obstruction.  She had failed conservative management, and underwent surgical correction on 05/14/2023 (Dr. Luisa Hart).   She had follow-up visit with surgery on 7/26. Staples were removed and steri-strips were placed. These have come off. She has no wound concerns. She has minimal abdominal pain. She had some gassiness and slight cramp this morning.  On 7/8, after she had received a total of 3 mg of IV dilaudid throughout the day, she developed repiratory arrest and LOC, with 02 sats into the 40's. She responded to Narcan.  She was DNR while in hospital, form completed 7/9 and scanned into chart. This conflicts with the MOST form that we completed a few weeks earlier at her wellness visit. This was discussed in detail today.  She reports wanting CPR,  but not wanting mechanical ventilation, which is where the confusion came in. She feels that she is young and healthy enough that she would want to be resuscitated. Discussed what the DNR she signed means.  She is under the care of Dr. Lavon Paganini, last seen in 06/2022. She has chronic idiopathic constipation and generalized abdominal bloating with a history of multiple episodes of small bowel obstruction secondary to adhesions.  Last hospitalization prior to the recent one was 06/2022, and also had SBO 09/2020.  She is s/p exploratory laparotomy for SBO in 04/2018.  She has been using dulcolax (tablets and suppositories) and stool softeners since discharge, along with daily Miralax.  Her bowels are "erratic"--small, thin, soft (worms), sometimes looser like diarrhea. She is having daily bowel movements.  She hasn't taken Movantik since prior to hospitalization.  Pantoprazole was started in the hospital.  Hasn't had any reflux since on this medication.  PMH, PSH, SH reviewed  All discharge mediations have been reviewed and reconciled with the most current medication list. Bethany [name] has their current medications, including OTC medications listed in the chart. Any changes to the medications from today's visit will be noted in the assessment and plan section of this note.  Outpatient Encounter Medications as of 06/02/2023  Medication Sig Note   acetaminophen (TYLENOL) 500 MG tablet Take 1,000 mg by mouth every 6 (six) hours as needed for moderate pain. 06/02/2023: Took 2 this am   bisacodyl (DULCOLAX) 10 MG suppository Unwrap and place 1 suppository (10 mg total) rectally daily as needed for  moderate constipation. 06/02/2023: Used one yesterday at 1pm   bisacodyl (DULCOLAX) 5 MG EC tablet Take 1 tablet (5 mg total) by mouth daily as needed for moderate constipation. 06/02/2023: Took one last night   docusate sodium (COLACE) 100 MG capsule Take 2 capsules (200 mg total) by mouth 2 (two) times daily for  8 days then as directed by MD 06/02/2023: Took 2 last night   gabapentin (NEURONTIN) 600 MG tablet Take 600-1,200 mg by mouth See admin instructions. Take 600 mg by mouth in the morning and 1200 mg at night    lamoTRIgine (LAMICTAL) 100 MG tablet Take 100 mg by mouth 2 (two) times daily.     Lotilaner (XDEMVY) 0.25 % SOLN Place 1 drop into both eyes in the morning and at bedtime.    pantoprazole (PROTONIX) 40 MG tablet Take 1 tablet (40 mg total) by mouth daily.    polyethylene glycol (MIRALAX) 17 g packet Take 17 g by mouth daily.    simethicone (MYLICON) 80 MG chewable tablet Chew 1 tablet (80 mg total) by mouth every 6 (six) hours as needed for flatulence. 06/02/2023: Took this morning   simvastatin (ZOCOR) 20 MG tablet TAKE 1 TABLET(20 MG) BY MOUTH DAILY    traZODone (DESYREL) 50 MG tablet Take 50 mg by mouth at bedtime. 06/02/2023: Takes one each night   VEVYE 0.1 % SOLN Apply 1 drop to eye 2 (two) times daily.    [DISCONTINUED] polyethylene glycol (MIRALAX) 17 g packet Take 17 g by mouth daily. 06/02/2023: Last dose yesterday   hydrocortisone 2.5 % cream Apply 1 Application topically 2 (two) times daily as needed (hemorrhoids). (Patient not taking: Reported on 06/02/2023) 06/02/2023: As needed   ibuprofen (ADVIL) 800 MG tablet TAKE 1 TABLET(800 MG) BY MOUTH EVERY 8 HOURS AS NEEDED (Patient not taking: Reported on 06/02/2023) 06/02/2023: As needed   MAGNESIUM CITRATE PO Take 250 mg by mouth daily. (Patient not taking: Reported on 06/02/2023)    MOVANTIK 12.5 MG TABS tablet TAKE 1 TABLET(12.5MG ) BY MOUTH EVERY DAY (Patient not taking: Reported on 06/02/2023)    Multiple Vitamins-Minerals (ADULT GUMMY PO) Take 1 each by mouth at bedtime. Vitamin B12 and Vitamin D gummy (Patient not taking: Reported on 06/02/2023)    ondansetron (ZOFRAN-ODT) 4 MG disintegrating tablet Take 1 tablet (4 mg total) by mouth every 6 (six) hours as needed for nausea. (Patient not taking: Reported on 06/02/2023) 06/02/2023: Last dose  2 days ago   vitamin C (ASCORBIC ACID) 500 MG tablet Take 500 mg by mouth every evening. (Patient not taking: Reported on 06/02/2023)    zinc gluconate 50 MG tablet Take 50 mg by mouth every evening. (Patient not taking: Reported on 06/02/2023)    [DISCONTINUED] oxyCODONE (OXY IR/ROXICODONE) 5 MG immediate release tablet Take 1 tablet (5 mg total) by mouth every 12 (twelve) hours as needed for severe pain or breakthrough pain. (Patient not taking: Reported on 06/02/2023)    [DISCONTINUED] prednisoLONE acetate (PRED FORTE) 1 % ophthalmic suspension Place 1 drop into both eyes 4 (four) times daily.    [DISCONTINUED] traMADol (ULTRAM) 50 MG tablet Take 1 tablet (50 mg total) by mouth every 6 (six) hours as needed for moderate pain. (Patient not taking: Reported on 06/02/2023)    No facility-administered encounter medications on file as of 06/02/2023.   She hasn't been taking movantik or vitamins since discharge.  Allergies  Allergen Reactions   Buprenorphine Hcl Nausea And Vomiting   Morphine And Codeine Nausea Only  ROS: no fever, chills, URI symptoms. Some nausea, no vomiting. No heartburn, dysphagia. Bowels per above. Depression per above. Sleeping well, possibly too much. No bleeding, bruising, rash. Tinnitus and hearing loss. States she had hearing eval, told she might benefit from hearing aid   PHYSICAL EXAM:  BP 110/60   Pulse 80   Ht 5\' 4"  (1.626 m)   Wt 111 lb 12.8 oz (50.7 kg)   BMI 19.19 kg/m   Wt Readings from Last 3 Encounters:  06/02/23 111 lb 12.8 oz (50.7 kg)  05/23/23 126 lb 5.2 oz (57.3 kg)  05/05/23 116 lb (52.6 kg)   Well-appearing, thin female, in no distress. Slightly tearful at first, but mood evened out during visit HEENT: conjunctiva and sclera are clear, EOMI Neck: no lymphadenopathy or mass Heart: regular rate and rhythm, no murmur Lungs: clear bilaterally Back: no spinal or CVA tenderness Abdomen: Healing midline incision. Active bowel sounds,  nontender. Slightly firm/distended. No organomegaly, rebound or guarding. Extremities: no edema.  R knee--WHSS, enlarged/swollen, warm (unchanged from last visit) Psych: mildly depressed, full range of affect.  Normal eye contact, grooming, speech Neuro: alert and oriented, cranial nerves grossly intact. Normal strength, gait.       06/02/2023    9:35 AM 04/28/2023    1:33 PM 04/23/2022    1:53 PM 12/11/2021    2:52 PM 02/21/2021    3:24 PM  Depression screen PHQ 2/9  Decreased Interest 3 0 0 1 0  Down, Depressed, Hopeless 3 0 0 1 0  PHQ - 2 Score 6 0 0 2 0  Altered sleeping 3 0 0 3   Tired, decreased energy 3 2 0 1   Change in appetite 3 0 0 1   Feeling bad or failure about yourself  3 0 0 1   Trouble concentrating 3 1 0 1   Moving slowly or fidgety/restless 0 0 0 1   Suicidal thoughts 0 0 0 0   PHQ-9 Score 21 3 0 10   Difficult doing work/chores Very difficult Not difficult at all Not difficult at all Somewhat difficult      ASSESSMENT/PLAN:  History of small bowel obstruction - incision healing well.  Some gassiness and slight firmness, nontender exam.  Check AAS. to f/u with GI, chronic constipation - Plan: Basic Metabolic Panel, CBC with Differential, DG Abd Acute W/Chest  Advanced care planning/counseling discussion - MOST 04/2023 completed with full code/care.  DNR signed 7/9. Clarified--doesn't want mechanical ventilation, okay for chest compressions, shock, if needed  Narcotic abuse in remission [facility]) - given narcotics related to surgery.  Required Narcan in hospital. No longer has any pain meds home. Struggling mentally, has psych f/u tomorrow  Depression, recurrent (HCC) - recurrent depression, vs adjustment (recent hosp/surgery) vs related to pain meds being stopped. f/u with psych as sched tomorrow  Constipation, unspecified constipation type - is on a bowel regimen currently, seems to be working well.  Hasn't restarted Movantik. Encouraged to f/u with Dr.  Lavon Paganini.  Hypokalemia - Plan: Basic Metabolic Panel  Per discharge summary, needs b-met, CBC and 2 view CXR in 1 week (now) Unclear why CXR recommended.  No cough or respiratory symptoms. CXR on admission normal. Given cramp this morning, will check AAS instead.  Addressed DNR/MOST forms which have conflicting info. Completed a NEW MOST form--she wants to be resuscitated ("too young to just let die"), but doesn't want mechanical ventilation. Okay for initial airway management, chest compressions, shock.   I spent 50  minutes dedicated to the care of this patient, including pre-visit review of records, face to face time, post-visit ordering of testing and documentation.   Please follow-up with Dr. Lavon Paganini. Posey Rea if you will need the pantoprazole long-term . Since you were having some heartburn and nausea, stay on it or now. You can try stopping it when the month's prescription runs out. If you have reurrent heartburn, you can try pepcid or prilosec OTC. If you are taking ibuprofen regularly, you would want to stay on a medication to protect your stomach and prevent ulcers. If not using an anti-inflammatory daily, and no heartburn, you may not need a medication long-term.  Resume taking your vitamin B12 (can affect memory if B12 levels are low).  Physical exercise, mental exercise, social connections, and sleep are all very important things for your memory. Please get your hearing evaluated. If you are having issues, consider getting a hearing aid.

## 2023-06-02 ENCOUNTER — Ambulatory Visit (INDEPENDENT_AMBULATORY_CARE_PROVIDER_SITE_OTHER): Payer: Medicare Other | Admitting: Family Medicine

## 2023-06-02 ENCOUNTER — Ambulatory Visit
Admission: RE | Admit: 2023-06-02 | Discharge: 2023-06-02 | Disposition: A | Discharge status: Home / Self Care | Payer: Medicare Other | Source: Ambulatory Visit | Attending: Family Medicine | Admitting: Family Medicine

## 2023-06-02 ENCOUNTER — Encounter: Payer: Self-pay | Admitting: Family Medicine

## 2023-06-02 ENCOUNTER — Telehealth: Payer: Self-pay | Admitting: Gastroenterology

## 2023-06-02 VITALS — BP 110/60 | HR 80 | Ht 64.0 in | Wt 111.8 lb

## 2023-06-02 DIAGNOSIS — Z9889 Other specified postprocedural states: Secondary | ICD-10-CM | POA: Diagnosis not present

## 2023-06-02 DIAGNOSIS — Z8719 Personal history of other diseases of the digestive system: Secondary | ICD-10-CM

## 2023-06-02 DIAGNOSIS — F1111 Opioid abuse, in remission: Secondary | ICD-10-CM | POA: Diagnosis not present

## 2023-06-02 DIAGNOSIS — F339 Major depressive disorder, recurrent, unspecified: Secondary | ICD-10-CM

## 2023-06-02 DIAGNOSIS — K5900 Constipation, unspecified: Secondary | ICD-10-CM

## 2023-06-02 DIAGNOSIS — Z7189 Other specified counseling: Secondary | ICD-10-CM | POA: Diagnosis not present

## 2023-06-02 DIAGNOSIS — E876 Hypokalemia: Secondary | ICD-10-CM

## 2023-06-02 LAB — BASIC METABOLIC PANEL
BUN/Creatinine Ratio: 12 (ref 12–28)
BUN: 11 mg/dL (ref 8–27)
CO2: 28 mmol/L (ref 20–29)
Calcium: 10.2 mg/dL (ref 8.7–10.3)
Chloride: 103 mmol/L (ref 96–106)
Creatinine, Ser: 0.91 mg/dL (ref 0.57–1.00)
Glucose: 72 mg/dL (ref 70–99)
Potassium: 5.1 mmol/L (ref 3.5–5.2)
Sodium: 143 mmol/L (ref 134–144)
eGFR: 69 mL/min/{1.73_m2} (ref >59)

## 2023-06-02 LAB — CBC WITH DIFFERENTIAL/PLATELET
Basophils Absolute: 0.1 10*3/uL (ref 0.0–0.2)
Basos: 1 %
EOS (ABSOLUTE): 0.1 10*3/uL (ref 0.0–0.4)
Eos: 1 %
Hematocrit: 36 % (ref 34.0–46.6)
Hemoglobin: 12.5 g/dL (ref 11.1–15.9)
Immature Grans (Abs): 0 10*3/uL (ref 0.0–0.1)
Immature Granulocytes: 0 %
Lymphocytes Absolute: 1 10*3/uL (ref 0.7–3.1)
Lymphs: 9 %
MCH: 34.3 pg — ABNORMAL HIGH (ref 26.6–33.0)
MCHC: 34.7 g/dL (ref 31.5–35.7)
MCV: 99 fL — ABNORMAL HIGH (ref 79–97)
Monocytes Absolute: 0.8 10*3/uL (ref 0.1–0.9)
Monocytes: 8 %
Neutrophils Absolute: 8.4 10*3/uL — ABNORMAL HIGH (ref 1.4–7.0)
Neutrophils: 81 %
Platelets: 374 10*3/uL (ref 150–450)
RBC: 3.64 x10E6/uL — ABNORMAL LOW (ref 3.77–5.28)
RDW: 11.9 % (ref 11.7–15.4)
WBC: 10.4 10*3/uL (ref 3.4–10.8)

## 2023-06-02 NOTE — Telephone Encounter (Signed)
Inbound call from patient stating she was recently at the ED for lower bowel obstruction and had to have surgery. Patient stated she does not want to wait until December to speak with Dr. Lavon Paganini. Requesting a call back to discuss further. Please advise, thank you.

## 2023-06-02 NOTE — Patient Instructions (Addendum)
  Please follow-up with Dr. Lavon Paganini. Posey Rea if you will need the pantoprazole long-term . Since you were having some heartburn and nausea, stay on it or now. You can try stopping it when the month's prescription runs out. If you have reurrent heartburn, you can try pepcid or prilosec OTC. If you are taking ibuprofen regularly, you would want to stay on a medication to protect your stomach and prevent ulcers. If not using an anti-inflammatory daily, and no heartburn, you may not need a medication long-term.  Resume taking your vitamin B12 (can affect memory if B12 levels are low).  Physical exercise, mental exercise, social connections, and sleep are all very important things for your memory. Please get your hearing evaluated. If you are having issues, consider getting a hearing aid.  Go to [address] [facility]) after your visit today for your x-rays.

## 2023-06-02 NOTE — Telephone Encounter (Signed)
Spoke with the patient. Able to schedule her for 06/03/23 at 10:40 am.

## 2023-06-03 ENCOUNTER — Ambulatory Visit (INDEPENDENT_AMBULATORY_CARE_PROVIDER_SITE_OTHER): Payer: Medicare Other | Admitting: Gastroenterology

## 2023-06-03 ENCOUNTER — Encounter: Payer: Self-pay | Admitting: Gastroenterology

## 2023-06-03 VITALS — BP 100/64 | HR 93 | Ht 65.0 in | Wt 111.4 lb

## 2023-06-03 DIAGNOSIS — K56609 Unspecified intestinal obstruction, unspecified as to partial versus complete obstruction: Secondary | ICD-10-CM | POA: Diagnosis not present

## 2023-06-03 DIAGNOSIS — K580 Irritable bowel syndrome with diarrhea: Secondary | ICD-10-CM | POA: Diagnosis not present

## 2023-06-03 DIAGNOSIS — F1021 Alcohol dependence, in remission: Secondary | ICD-10-CM | POA: Diagnosis not present

## 2023-06-03 MED ORDER — DIPHENOXYLATE-ATROPINE 2.5-0.025 MG PO TABS: 1.0000 | ORAL_TABLET | Freq: Four times a day (QID) | ORAL | 0 refills | Status: AC | PRN

## 2023-06-03 NOTE — Progress Notes (Signed)
Bethany [name]    [id number]    May 18, 1956  Primary Care Physician:Knapp, Clarene Critchley, MD  Referring Physician: Joselyn Arrow, MD [address] [place],  [place] [address]   Chief complaint:   Chief Complaint  Patient presents with   Diarrhea    Patient states she has had a small bowel obstruction 5 weeks ago. Patient states she had diarrhea all day yesterday and abdominal cramping. Patient stomach makes a "Gurgling"  sound throughout the day.   HPI: 67 year old very pleasant female here for follow-up visit for a follow up of small bowel obstruction.   She was last seen on 06-25-22.   Today, she complains of abdominal pain and diarrhea. She states that her BM's have not been normal. She's been having 12-14x BM for the past few days. Normally, she has about 4-5X BM a day. Her stool has been loose without much form to it. She states that she has decreased appetite but she has been trying to eat. Although, her appetite is improving.  She is on multiple laxatives, Dulcolax, mag citrate, Movantik, MiraLAX and Colace, she is afraid of 1 more episode of small bowel obstruction if she gets constipated  We also reviewed her latest bowel obstruction and hospitalization that occurred about 5 weeks ago.   Patient denies any nausea, blood in stool, black stool, vomiting, bloating, unintentional weight loss, reflux, dysphagia.   GI Hx: DG Abd Portable 1v 05-13-23 1. Overall paucity of small bowel gas limits the ability to assess for obstruction. There is a dilated loop of bowel that projects over the lower pelvis. 2. Enteric tube with side hole above the level of the GE junction, recommend advancement.  DG Abd Portable 1v 05-12-23 Previously seen small bowel dilatation has resolved.   CT Abdomen Pelvis w contrast 05-10-23 1. Dilated small bowel loop in the left lower quadrant of the abdomen, with air-fluid level in this region. No proximal dilatation of small bowel or stomach  otherwise noted. However, the possibility of earlier partial small bowel obstruction should be considered, potentially from adhesions. Surgical consultation is recommended. 2. 2 mm nonobstructive calculus in the lower pole collecting system of the left kidney. 3. Additional postoperative changes and incidental findings, as above.  Colonoscopy 07-01-22 - One 5 mm polyp in the transverse colon, removed with a cold snare. Resected and retrieved.  - External and internal hemorrhoids Surgical [P], colon, transverse, polyp (1) - TUBULAR ADENOMA. - NO HIGH GRADE DYSPLASIA OR MALIGNANCY.  Colonoscopy 04/10/2016: 5 mm sessile polyp, melanosis, internal hemorrhoids  Colonoscopy 07-21-04 Normal   She has multiple episodes of small bowel obstruction secondary to adhesions, was recently hospitalized for SBO which improved with NG tube suction and bowel rest  She was hospitalized after knee replacement surgery in June 2011 with constipation, abdominal pain and vomiting.  CT abd & pelvis showed moderate to high grade partial small bowel obstruction.  She had undergo exlap with lysis with adhesions and resection of distal ileum. Pathology chronic inflammation with adhesions, negative for dysplasia or malignancy.    Current Outpatient Medications:    acetaminophen (TYLENOL) 500 MG tablet, Take 1,000 mg by mouth every 6 (six) hours as needed for moderate pain., Disp: , Rfl:    bisacodyl (DULCOLAX) 10 MG suppository, Unwrap and place 1 suppository (10 mg total) rectally daily as needed for moderate constipation., Disp: 20 suppository, Rfl: 0   bisacodyl (DULCOLAX) 5 MG EC tablet, Take 1 tablet (5 mg  total) by mouth daily as needed for moderate constipation., Disp: 15 tablet, Rfl: 0   docusate sodium (COLACE) 100 MG capsule, Take 2 capsules (200 mg total) by mouth 2 (two) times daily for 8 days then as directed by MD, Disp: 100 capsule, Rfl: 0   gabapentin (NEURONTIN) 600 MG tablet, Take 600-1,200 mg by  mouth See admin instructions. Take 600 mg by mouth in the morning and 1200 mg at night, Disp: , Rfl:    hydrocortisone 2.5 % cream, Apply 1 Application topically 2 (two) times daily as needed (hemorrhoids)., Disp: , Rfl:    ibuprofen (ADVIL) 800 MG tablet, TAKE 1 TABLET(800 MG) BY MOUTH EVERY 8 HOURS AS NEEDED, Disp: 90 tablet, Rfl: 0   lamoTRIgine (LAMICTAL) 100 MG tablet, Take 100 mg by mouth 2 (two) times daily. , Disp: , Rfl: 0   Lotilaner (XDEMVY) 0.25 % SOLN, Place 1 drop into both eyes in the morning and at bedtime., Disp: , Rfl:    MAGNESIUM CITRATE PO, Take 250 mg by mouth daily., Disp: , Rfl:    MOVANTIK 12.5 MG TABS tablet, TAKE 1 TABLET(12.5MG ) BY MOUTH EVERY DAY, Disp: 30 tablet, Rfl: 6   Multiple Vitamins-Minerals (ADULT GUMMY PO), Take 1 each by mouth at bedtime. Vitamin B12 and Vitamin D gummy, Disp: , Rfl:    ondansetron (ZOFRAN-ODT) 4 MG disintegrating tablet, Take 1 tablet (4 mg total) by mouth every 6 (six) hours as needed for nausea., Disp: 20 tablet, Rfl: 0   pantoprazole (PROTONIX) 40 MG tablet, Take 1 tablet (40 mg total) by mouth daily., Disp: 30 tablet, Rfl: 0   polyethylene glycol (MIRALAX) 17 g packet, Take 17 g by mouth daily., Disp: 14 each, Rfl: 0   simethicone (MYLICON) 80 MG chewable tablet, Chew 1 tablet (80 mg total) by mouth every 6 (six) hours as needed for flatulence., Disp: 100 tablet, Rfl: 0   simvastatin (ZOCOR) 20 MG tablet, TAKE 1 TABLET(20 MG) BY MOUTH DAILY, Disp: 90 tablet, Rfl: 3   traZODone (DESYREL) 50 MG tablet, Take 50 mg by mouth at bedtime., Disp: , Rfl:    VEVYE 0.1 % SOLN, Apply 1 drop to eye 2 (two) times daily., Disp: , Rfl:    vitamin C (ASCORBIC ACID) 500 MG tablet, Take 500 mg by mouth every evening., Disp: , Rfl:    zinc gluconate 50 MG tablet, Take 50 mg by mouth every evening., Disp: , Rfl:     Allergies as of 06/03/2023 - Review Complete 06/03/2023  Allergen Reaction Noted   Buprenorphine hcl Nausea And Vomiting 09/18/2015    Morphine and codeine Nausea Only 09/18/2015    Past Medical History:  Diagnosis Date   ADHD (attention deficit hyperactivity disorder) 09/13/2018   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 All Paps normal afterwards   Degenerative disc disease, lumbar 05/02/2020   Generalized anxiety disorder    Herpes genitalis    Herpes labialis    History of calcium pyrophosphate deposition disease (CPPD) 03/26/2020   History of opiate abuse, in remission    History of small bowel obstruction 04/17/2018   partial   Hyperlipidemia with target LDL less than 100 06/17/2011   IBS (irritable bowel syndrome)    Lumbago with sciatica, right side 02/10/2021   Major depressive disorder    Meibomian gland dysfunction (MGD)  of both eyes 08/02/2020   Narcotic bowel syndrome (HCC)    Osteopenia 04/2014   T score -1.7 FRAX 7.3%/0.8% stable from prior DEXA 2013   Raynaud's disease 06/17/2011   Recovering alcoholic in remission     S/P LASIK (laser assisted in situ keratomileusis) 08/02/2020   right eye   Spondylolisthesis of lumbar region 02/10/2021   Status post total right knee replacement 10/31/2019   Tubular adenoma of colon 04/21/2016    Past Surgical History:  Procedure Laterality Date   AUGMENTATION MAMMAPLASTY     BREAST ENHANCEMENT SURGERY  2006   CATARACT EXTRACTION Bilateral    Jan/Feb 2024   COSMETIC SURGERY     EYE SURGERY     lasik od   GYNECOLOGIC CRYOSURGERY  1990   LAPAROTOMY N/A 04/22/2018   Procedure: EXPLORATORY LAPAROTOMY;  Surgeon: Harriette Bouillon, MD;  Location: [facility] OR;  Service: General;  Laterality: N/A;   LAPAROTOMY N/A 05/14/2023   Procedure: EXPLORATORY LAPAROTOMY;  Surgeon: Harriette Bouillon, MD;  Location: [facility];  Service: General;  Laterality: N/A;   LIGAMENT REPAIR Right 09/24/2015    Procedure: RIGHT INDEX METACARPAL PHALANGEAL RADIAL COLLATERAL LIGAMENT REPAIR;  Surgeon: Betha Loa, MD;  Location: [facility];  Service: Orthopedics;  Laterality: Right;   LYSIS OF ADHESION N/A 05/14/2023   Procedure: LYSIS OF ADHESION, REPAIR OF SMALL BOWEL SEROSAL TEAR x 3 WITH FULL-THICKNESS REPAIR x 1;  Surgeon: Harriette Bouillon, MD;  Location: [facility];  Service: General;  Laterality: N/A;   MASS EXCISION N/A 08/04/2018   Procedure: REMOVAL OF SUTURE FROM ABDOMEN/ERAS PATHWAY;  Surgeon: Harriette Bouillon, MD;  Location: [facility];  Service: General;  Laterality: N/A;   OOPHORECTOMY     BSO   Right knee arthoscopy Right 09/09/2017   [facility]   SHX 1 Left 02/19/2020   basel cell carcinoma   SHX1 Right 12/11/2020   basal cell carinoma,micronodular pattern   SKIN BIOPSY Right 02/28/2019   Superficial basal carcinoma    SKIN BIOPSY Left 11/24/2021   superficial basal carcinoma of the left forearm   TONSILLECTOMY AND ADENOIDECTOMY     TOTAL KNEE ARTHROPLASTY Right 04/12/2018   TOTAL KNEE ARTHROPLASTY Right 04/12/2018   Procedure: RIGHT TOTAL KNEE ARTHROPLASTY;  Surgeon: Marcene Corning, MD;  Location: [facility];  Service: Orthopedics;  Laterality: Right;   TRANSFORAMINAL LUMBAR INTERBODY FUSION W/ MIS 1 LEVEL N/A 09/03/2021   Procedure: Lumbar four-five Minimally invasive transforaminal lumbar interbody fusion;  Surgeon: Dawley, Alan Mulder, DO;  Location: [facility];  Service: Neurosurgery;  Laterality: N/A;   TUBAL LIGATION     UPPER GASTROINTESTINAL ENDOSCOPY  04/24/2005   VAGINAL HYSTERECTOMY  2005   TVH BSO  adenomyosis    Family History  Problem Relation Age of Onset   Hypertension Mother    Dementia Mother        rapid decline; concerns for Creutzfeldt-Jakob disease   Lymphoma Father 48   Breast cancer Sister 24   Hypertension Daughter    Colon cancer Neg Hx     Social History   Socioeconomic History   Marital status: Married    Spouse name: Not on  file   Number of children: Not on file   Years of education: 16   Highest education level: Bachelor's degree (e.g., BA, AB, BS)  Occupational History   Occupation: Retired    Comment: Librarian, academic  Tobacco Use   Smoking status: Former    Current packs/day: 0.00    Average  packs/day: 1 pack/day for 40.0 years (40.0 ttl pk-yrs)    Types: Cigarettes    Start date: 03/21/1966    Quit date: 03/21/2006    Years since quitting: 17.2   Smokeless tobacco: Never  Vaping Use   Vaping status: Never Used  Substance and Sexual Activity   Alcohol use: Not Currently    Comment: history of alcohol abuse/dependence. Sober for past 20 years   Drug use: Not Currently    Types: Oxycodone    Comment: history of opiate abuse. Sober for 1.5 years (after back surgery)   Sexual activity: Yes    Birth control/protection: Surgical    Comment: HYST-1st intercourse 67 yo-More than 5 partners  Other Topics Concern   Not on file  Social History Narrative   Married. 1 dog (golden doodle puppy)   2 daughters, 1 grandson, 1 granddaughter (local).   1 daughter lives here, one in [place].   Retired Librarian, academic      Reviewed 04/2023   Social Determinants of Health   Financial Resource Strain: Not on file  Food Insecurity: No Food Insecurity (05/25/2023)   Hunger Vital Sign    Worried About Running Out of Food in the Last Year: Never true    Ran Out of Food in the Last Year: Never true  Transportation Needs: No Transportation Needs (05/25/2023)   PRAPARE - Administrator, Civil Service (Medical): No    Lack of Transportation (Non-Medical): No  Physical Activity: Not on file  Stress: Not on file  Social Connections: Not on file  Intimate Partner Violence: Not At Risk (05/17/2023)   Humiliation, Afraid, Rape, and Kick questionnaire    Fear of Current or Ex-Partner: No    Emotionally Abused: No    Physically Abused: No    Sexually Abused: No    Review of systems: Review of Systems   Constitutional:  Negative for unexpected weight change.  HENT:  Negative for trouble swallowing.   Gastrointestinal:  Positive for abdominal pain, constipation and diarrhea. Negative for abdominal distention, anal bleeding, blood in stool, nausea, rectal pain and vomiting.      Physical Exam: Vitals:   06/03/23 1040  BP: 100/64  Pulse: 93    Body mass index is 18.53 kg/m. General: well-appearing   Eyes: sclera anicteric, no redness ENT: oral mucosa moist without lesions, no cervical or supraclavicular lymphadenopathy CV: RRR, no JVD, no peripheral edema Resp: clear to auscultation bilaterally, normal RR and effort noted GI: soft, no tenderness, with active bowel sounds.  Midline surgical scar healing well.  No guarding or palpable organomegaly noted. Skin; warm and dry, no rash or jaundice noted Neuro: awake, alert and oriented x 3. Normal gross motor function and fluent speech   Data Reviewed:  Reviewed labs, radiology imaging, old records and pertinent past GI work up   Assessment and Plan/Recommendations:  89 yr F with h/o SBO secondary to adhesions s/p ex lap with lysis of adhesions and distal ileum resection 04/2018 with recurrent SBO secondary to adhesions, recently hospitalized in July with recurrent small bowel obstruction s/p ex lap with small bowel resection  Advised patient to hold all the laxatives, she is afraid of having constipation and recurrent small bowel obstruction.  Advised her to use small amount of MiraLAX 1 to 2 teaspoon daily as needed to have 3-4 soft bowel movements daily which is her normal baseline Stop taking Movantik, mag citrate and Dulcolax  Use Lomotil 1 tablet up to 3 times daily  as needed if have more than 3 bowel movements per day  Small frequent meals with high-protein diet Maintain hydration  The patient was provided an opportunity to ask questions and all were answered. The patient agreed with the plan and demonstrated an  understanding of the instructions.   I,Safa M Kadhim,acting as a scribe for Marsa Aris, MD.,have documented all relevant documentation on the behalf of Marsa Aris, MD,as directed by  Marsa Aris, MD while in the presence of Marsa Aris, MD.   I, Marsa Aris, MD, have reviewed all documentation for this visit. The documentation on 06/03/23 for the exam, diagnosis, procedures, and orders are all accurate and complete.   Iona Beard , MD    CC: Joselyn Arrow, MD

## 2023-06-03 NOTE — Patient Instructions (Signed)
_______________________________________________________  If your blood pressure at your visit was 140/90 or greater, please contact your primary care physician to follow up on this.  _______________________________________________________  If you are age 67 or older, your body mass index should be between 23-30. Your Body mass index is 18.53 kg/m. If this is out of the aforementioned range listed, please consider follow up with your Primary Care Provider.  If you are age 31 or younger, your body mass index should be between 19-25. Your Body mass index is 18.53 kg/m. If this is out of the aformentioned range listed, please consider follow up with your Primary Care Provider.   We have sent the following medications to your pharmacy for you to pick up at your convenience:  Lomotil one tablet as needed.  Start Miralax 1/2 capful daily.  The [facility] providers would like to encourage you to use [facility] to communicate with providers for non-urgent requests or questions.  Due to long hold times on the telephone, sending your provider a message by [facility] may be a faster and more efficient way to get a response.  Please allow 48 business hours for a response.  Please remember that this is for non-urgent requests.     It was a pleasure to see you today!  Thank you for trusting me with your gastrointestinal care!     _______________________________________________________

## 2023-06-04 ENCOUNTER — Encounter: Payer: Self-pay | Admitting: Gastroenterology

## 2023-06-09 ENCOUNTER — Other Ambulatory Visit: Payer: Self-pay

## 2023-06-09 ENCOUNTER — Telehealth: Payer: Self-pay | Admitting: Family Medicine

## 2023-06-09 DIAGNOSIS — M8580 Other specified disorders of bone density and structure, unspecified site: Secondary | ICD-10-CM

## 2023-06-09 NOTE — Telephone Encounter (Signed)
Patient is scheduled for a Bone Density next week per the request of Dr Katrinka Blazing but there are not any orders.  Can this be put in for her please?

## 2023-06-09 NOTE — Telephone Encounter (Signed)
Order added.

## 2023-06-14 ENCOUNTER — Ambulatory Visit (INDEPENDENT_AMBULATORY_CARE_PROVIDER_SITE_OTHER)
Admission: RE | Admit: 2023-06-14 | Discharge: 2023-06-14 | Disposition: A | Discharge status: Home / Self Care | Payer: Medicare Other | Source: Ambulatory Visit | Attending: Family Medicine | Admitting: Family Medicine

## 2023-06-14 DIAGNOSIS — M8580 Other specified disorders of bone density and structure, unspecified site: Secondary | ICD-10-CM

## 2023-06-15 NOTE — Progress Notes (Unsigned)
[name] [address] [place] [address] Phone: [phone] Subjective:   Bethany [name], am serving as a scribe for Dr. Antoine Primas.  I'm seeing this patient by the request  of:  Joselyn Arrow, MD  CC: Neck and back pain follow-up  [id number]  Bethany [name] is a 67 y.o. female coming in with complaint of back and neck pain. OMT 05/05/2023. Patient states that she had surgery since last visit. Back has not been bad but not great either. R knee pain increased after laying in hosptial for 2 weeks. Pain improved 4 days ago.   Medications patient has been prescribed: None  Taking:  Patient since last exam did have a bone density done on the 12th.  This was independently visualized by me showing the patient is osteopenic but still relatively stable from the 2021       Reviewed prior external information including notes and imaging from previsou exam, outside providers and external EMR if available.  Patient recently did see surgeon after her small bowel obstruction.  As well as notes that were available from care everywhere and other healthcare systems.  Past medical history, social, surgical and family history all reviewed in electronic medical record.  No pertanent information unless stated regarding to the chief complaint.   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 All Paps normal afterwards   Degenerative disc disease, lumbar 05/02/2020   Generalized anxiety disorder    Herpes genitalis    Herpes labialis    History of calcium pyrophosphate deposition disease (CPPD) 03/26/2020   History of opiate abuse, in remission    History of small bowel obstruction  04/17/2018   partial   Hyperlipidemia with target LDL less than 100 06/17/2011   IBS (irritable bowel syndrome)    Lumbago with sciatica, right side 02/10/2021   Major depressive disorder    Meibomian gland dysfunction (MGD) of both eyes 08/02/2020   Narcotic bowel syndrome (HCC)    Osteopenia 04/2014   T score -1.7 FRAX 7.3%/0.8% stable from prior DEXA 2013   Raynaud's disease 06/17/2011   Recovering alcoholic in remission     S/P LASIK (laser assisted in situ keratomileusis) 08/02/2020   right eye   Spondylolisthesis of lumbar region 02/10/2021   Status post total right knee replacement 10/31/2019   Tubular adenoma of colon 04/21/2016    Allergies  Allergen Reactions   Buprenorphine Hcl Nausea And Vomiting   Morphine And Codeine Nausea Only     Review of Systems:  No headache, visual changes, nausea, vomiting, diarrhea, constipation, dizziness, abdominal pain, skin rash, fevers, chills, night sweats, weight loss, swollen lymph nodes, body aches, joint swelling, chest pain, shortness of breath, mood changes. POSITIVE muscle aches  Objective  Blood pressure 116/82, height 5\' 5"  (1.651 m), weight 112 lb (50.8 kg).   General: No apparent distress alert and oriented x3 mood and affect normal, dressed appropriately.  HEENT: Pupils equal, extraocular movements intact  Respiratory: Patient's speak in full sentences and does not appear short of breath  Cardiovascular: No lower extremity edema, non tender, no erythema  Patient is cachectic at this moment.  Patient does have significant loss of lordosis with some degenerative scoliosis  of the lumbar spine.  Some worsening pain with extension of the back greater than 10 degrees.  Neurovascular intact distally though.  Well-healed scar from patient's most recent surgery for the small bowel obstruction.  Osteopathic findings  C2 flexed rotated and side bent right C5 flexed rotated and side bent left T3 extended rotated and side bent  right inhaled rib T9 extended rotated and side bent left L2 flexed rotated and side bent right L4 flexed rotated and side bent left Sacrum left on left       Assessment and Plan:  Spondylolisthesis of lumbar region Known spinal thesis.  Patient has been less active and has lost muscle mass secondary to small bowel obstruction recent hospitalization.  With some weight gain.  We discussed again to about starting to get increasing activity.  Has lost more range of motion of the knee as well and patient has continued difficulty with scar tissue formation.  We will see how patient does over the next 5 to 6 weeks.  No medication changes at this time    Nonallopathic problems  Decision today to treat with OMT was based on Physical Exam  After verbal consent patient was treated with HVLA, ME, FPR techniques in cervical, rib, thoracic, lumbar, and sacral  areas  Patient tolerated the procedure well with improvement in symptoms  Patient given exercises, stretches and lifestyle modifications  See medications in patient instructions if given  Patient will follow up in 4-8 weeks     The above documentation has been reviewed and is accurate and complete Judi Saa, DO         Note: This dictation was prepared with Dragon dictation along with smaller phrase technology. Any transcriptional errors that result from this process are unintentional.

## 2023-06-16 ENCOUNTER — Encounter: Payer: Self-pay | Admitting: Family Medicine

## 2023-06-16 ENCOUNTER — Ambulatory Visit (INDEPENDENT_AMBULATORY_CARE_PROVIDER_SITE_OTHER): Payer: Medicare Other | Admitting: Family Medicine

## 2023-06-16 VITALS — BP 116/82 | Ht 65.0 in | Wt 112.0 lb

## 2023-06-16 DIAGNOSIS — M9904 Segmental and somatic dysfunction of sacral region: Secondary | ICD-10-CM

## 2023-06-16 DIAGNOSIS — L570 Actinic keratosis: Secondary | ICD-10-CM | POA: Diagnosis not present

## 2023-06-16 DIAGNOSIS — D0462 Carcinoma in situ of skin of left upper limb, including shoulder: Secondary | ICD-10-CM | POA: Diagnosis not present

## 2023-06-16 DIAGNOSIS — M9908 Segmental and somatic dysfunction of rib cage: Secondary | ICD-10-CM

## 2023-06-16 DIAGNOSIS — M4316 Spondylolisthesis, lumbar region: Secondary | ICD-10-CM

## 2023-06-16 DIAGNOSIS — M9901 Segmental and somatic dysfunction of cervical region: Secondary | ICD-10-CM

## 2023-06-16 DIAGNOSIS — M9903 Segmental and somatic dysfunction of lumbar region: Secondary | ICD-10-CM

## 2023-06-16 DIAGNOSIS — M9902 Segmental and somatic dysfunction of thoracic region: Secondary | ICD-10-CM

## 2023-06-16 NOTE — Patient Instructions (Addendum)
Good to see you  I am glad you are dong better  See me again in 5-6 weeks

## 2023-06-16 NOTE — Assessment & Plan Note (Signed)
Known spinal thesis.  Patient has been less active and has lost muscle mass secondary to small bowel obstruction recent hospitalization.  With some weight gain.  We discussed again to about starting to get increasing activity.  Has lost more range of motion of the knee as well and patient has continued difficulty with scar tissue formation.  We will see how patient does over the next 5 to 6 weeks.  No medication changes at this time

## 2023-06-17 ENCOUNTER — Encounter: Payer: Self-pay | Admitting: Family Medicine

## 2023-06-17 ENCOUNTER — Ambulatory Visit (INDEPENDENT_AMBULATORY_CARE_PROVIDER_SITE_OTHER): Payer: Medicare Other | Admitting: Family Medicine

## 2023-06-17 ENCOUNTER — Other Ambulatory Visit: Payer: Self-pay

## 2023-06-17 VITALS — BP 130/68 | HR 76 | Temp 97.9°F | Resp 14 | Wt 113.0 lb

## 2023-06-17 DIAGNOSIS — Z87891 Personal history of nicotine dependence: Secondary | ICD-10-CM

## 2023-06-17 DIAGNOSIS — M5136 Other intervertebral disc degeneration, lumbar region: Secondary | ICD-10-CM

## 2023-06-17 DIAGNOSIS — H6122 Impacted cerumen, left ear: Secondary | ICD-10-CM

## 2023-06-17 DIAGNOSIS — G8929 Other chronic pain: Secondary | ICD-10-CM

## 2023-06-17 DIAGNOSIS — R490 Dysphonia: Secondary | ICD-10-CM

## 2023-06-17 DIAGNOSIS — F1121 Opioid dependence, in remission: Secondary | ICD-10-CM | POA: Diagnosis not present

## 2023-06-17 NOTE — Progress Notes (Signed)
Chief Complaint  Patient presents with   Hoarse    Reports having hoarseness for the past year which has progressively worsened. Also feels tightness in her throat. States"it feels like food is going to get stuck in my throat when I swallow". Has trouble swallowing thick liquids. RC   Frequent hoarseness, throat-clearing.  Progressively worse x 1 month. Sometimes she has trouble swallowing, pills have gotten stuck never food. This is in the throat area, NOT in the chest.  Former smoker, x 30-35 years, quit 17 years ago.  Still uses nicotine lozenges.  Denies allergies, no sniffling, congestion.  Some sneezing.  Occasional runny nose. Doesn't feel like she has PND. Denies heartburn, indigestion. +belching. She has been taking zyrtec daily since she had a sinus infection in the spring.  Hasn't noticed improvement in throat symptoms. She has been taking protonix daily since hospital discharge.   PMH, PSH, SH reviewed  Outpatient Encounter Medications as of 06/17/2023  Medication Sig Note   acetaminophen (TYLENOL) 500 MG tablet Take 1,000 mg by mouth every 6 (six) hours as needed for moderate pain. 06/17/2023: Last dose 5 days ago.    bisacodyl (DULCOLAX) 10 MG suppository Unwrap and place 1 suppository (10 mg total) rectally daily as needed for moderate constipation.    diphenoxylate-atropine (LOMOTIL) 2.5-0.025 MG tablet Take 1 tablet by mouth 4 (four) times daily as needed for diarrhea or loose stools.    docusate sodium (COLACE) 100 MG capsule Take 2 capsules (200 mg total) by mouth 2 (two) times daily for 8 days then as directed by MD 06/17/2023: Takes 2 nightly   gabapentin (NEURONTIN) 600 MG tablet Take 600-1,200 mg by mouth See admin instructions. Take 600 mg by mouth in the morning and 1200 mg at night    hydrocortisone 2.5 % cream Apply 1 Application topically 2 (two) times daily as needed (hemorrhoids). 06/02/2023: As needed   ibuprofen (ADVIL) 800 MG tablet TAKE 1 TABLET(800 MG) BY  MOUTH EVERY 8 HOURS AS NEEDED 06/17/2023: Last dose this morning.     lamoTRIgine (LAMICTAL) 100 MG tablet Take 100 mg by mouth 2 (two) times daily.     Lotilaner (XDEMVY) 0.25 % SOLN Place 1 drop into both eyes in the morning and at bedtime.    MAGNESIUM CITRATE PO Take 250 mg by mouth daily.    Multiple Vitamins-Minerals (ADULT GUMMY PO) Take 1 each by mouth at bedtime. Vitamin B12 and Vitamin D gummy    ondansetron (ZOFRAN-ODT) 4 MG disintegrating tablet Take 1 tablet (4 mg total) by mouth every 6 (six) hours as needed for nausea. 06/17/2023: Last dose 3-4 weeks ago.     pantoprazole (PROTONIX) 40 MG tablet Take 1 tablet (40 mg total) by mouth daily.    polyethylene glycol (MIRALAX) 17 g packet Take 17 g by mouth daily.    simethicone (MYLICON) 80 MG chewable tablet Chew 1 tablet (80 mg total) by mouth every 6 (six) hours as needed for flatulence. 06/17/2023: Last dose 3 weeks ago.    simvastatin (ZOCOR) 20 MG tablet TAKE 1 TABLET(20 MG) BY MOUTH DAILY    traZODone (DESYREL) 50 MG tablet Take 50 mg by mouth at bedtime.    VEVYE 0.1 % SOLN Apply 1 drop to eye 2 (two) times daily.    vitamin C (ASCORBIC ACID) 500 MG tablet Take 500 mg by mouth every evening.    zinc gluconate 50 MG tablet Take 50 mg by mouth every evening.    bisacodyl (DULCOLAX) 5 MG  EC tablet Take 1 tablet (5 mg total) by mouth daily as needed for moderate constipation. 06/17/2023: Took one last night   MOVANTIK 12.5 MG TABS tablet TAKE 1 TABLET(12.5MG ) BY MOUTH EVERY DAY (Patient not taking: Reported on 06/17/2023)    No facility-administered encounter medications on file as of 06/17/2023.    Allergies  Allergen Reactions   Buprenorphine Hcl Nausea And Vomiting   Morphine And Codeine Nausea Only    ROS:  no fever, chills, URI symptoms. R knee pain has improved, was referred to pain management. No heartburn, + belching. No sinus pain, cough, shortness of breath, chest pain. Hoarseness and intermittent dysphagia (at  level of throat), per HPI. Moods are improved.   PHYSICAL EXAM:  BP 130/68   Pulse 76   Temp 97.9 F (36.6 C) (Oral)   Resp 14   Wt 113 lb (51.3 kg)   SpO2 99% Comment: room air  BMI 18.80 kg/m   Well-appearing, pleasant female in no distress HEENT: conjunctiva and clear are clear, EOMI.  Nose is without any mucosal edema or mucus, no erythema.  Sinuses are nontender. OP clear. No cobblestoning or PND> L cerumen impaction, R TM and EAC normal. Neck: no lymphadenopathy or thyromegaly  Heart: regular rate and rhythm Lungs: clear bilaterally Extremities: no edema Neuro: alert and oriented, cranial nerves grossly intact, normal gait Psych: normal mood, affect, hygiene and grooming   ASSESSMENT/PLAN:  Hoarseness - intermittent. Ddx reviewed with pt, including GERD and allergies/PND.  Persists despite treatment for these, refer to ENT - Plan: Ambulatory referral to ENT  Former smoker - increases risk for esophageal/throat cancer.  refer to ENT for further eval of hoarseness - Plan: Ambulatory referral to ENT  Impacted cerumen of left ear - asymptomatic. Advised she can use OTC Debrox drops, and schedule a f/u for ear lavage if decreased hearing persists (vs d/w ENT at visit)   Stay well hydrated. Try taking mucinex (guaifenesin)--an expectorant to loosen any potential postnasal drainage that might be contributing to your symptoms. We will refer you to ENT for further evaluation of your symptoms and hoarseness, especially given that you have a history of smoking, and that symptoms haven't improved with antihistamines and reflux treatments.   Use Debrox drops to the left ear. Schedule another visit in 1-2 weeks if you still have ongoing decreased hearing from the left ear, and we can remove the wax.

## 2023-06-17 NOTE — Patient Instructions (Signed)
  Stay well hydrated. Try taking mucinex (guaifenesin)--an expectorant to loosen any potential postnasal drainage that might be contributing to your symptoms. We will refer you to ENT for further evaluation of your symptoms and hoarseness, especially given that you have a history of smoking, and that symptoms haven't improved with antihistamines and reflux treatments.   Use Debrox drops to the left ear. Schedule another visit in 1-2 weeks if you still have ongoing decreased hearing from the left ear, and we can remove the wax.

## 2023-06-18 NOTE — Addendum Note (Signed)
Addended by: Joselyn Arrow on: 06/18/2023 08:08 AM   Modules accepted: Level of Service

## 2023-06-22 ENCOUNTER — Encounter: Payer: Self-pay | Admitting: Family Medicine

## 2023-06-22 ENCOUNTER — Ambulatory Visit (HOSPITAL_BASED_OUTPATIENT_CLINIC_OR_DEPARTMENT_OTHER): Payer: Medicare Other | Attending: Family Medicine | Admitting: Internal Medicine

## 2023-06-22 ENCOUNTER — Other Ambulatory Visit: Payer: Self-pay

## 2023-06-22 DIAGNOSIS — R0683 Snoring: Secondary | ICD-10-CM | POA: Insufficient documentation

## 2023-06-22 DIAGNOSIS — G4733 Obstructive sleep apnea (adult) (pediatric): Secondary | ICD-10-CM | POA: Diagnosis not present

## 2023-06-22 DIAGNOSIS — G478 Other sleep disorders: Secondary | ICD-10-CM | POA: Insufficient documentation

## 2023-06-22 DIAGNOSIS — R400 Somnolence: Secondary | ICD-10-CM | POA: Insufficient documentation

## 2023-06-24 ENCOUNTER — Encounter: Payer: Self-pay | Admitting: Family Medicine

## 2023-06-26 DIAGNOSIS — G478 Other sleep disorders: Secondary | ICD-10-CM

## 2023-06-26 NOTE — Procedures (Signed)
   Patient Name: Bethany [name], Bethany [name] Date: 06/22/2023 Gender: Female D.O.B: [birth year] Age (years): 89 Referring Provider: Joselyn Arrow Height (inches): 65 Interpreting Physician: Jetty Duhamel MD, ABSM Weight (lbs): 112 RPSGT: [place] BMI: 19 MRN: [id number] Neck Size: <br>  CLINICAL INFORMATION Sleep Study Type: HST Indication for sleep study: Snoring Epworth Sleepiness Score: 17  SLEEP STUDY TECHNIQUE A multi-channel overnight portable sleep study was performed. The channels recorded were: nasal airflow, thoracic respiratory movement, and oxygen saturation with a pulse oximetry. Snoring was also monitored.  MEDICATIONS Patient self administered medications include: none reported.  SLEEP ARCHITECTURE Patient was studied for 363 minutes. The sleep efficiency was 100.0 % and the patient was supine for 0%. The arousal index was 0.0 per hour.  RESPIRATORY PARAMETERS The overall AHI was 53.6 per hour, with a central apnea index of 0 per hour. The oxygen nadir was 75% during sleep.  CARDIAC DATA Mean heart rate during sleep was 74.1 bpm.  IMPRESSIONS - Severe obstructive sleep apnea occurred during this study (AHI = 53.6/h). - Oxygen desaturation was noted during this study (Min O2 = 75%, Mean 94%). - Patient snored.  DIAGNOSIS - Obstructive Sleep Apnea (G47.33)  RECOMMENDATIONS - Suggest CPAP titration sleep study or autopap. Other options would be based on clinical judgment. - Be careful with alcohol, sedatives and other CNS depressants that may worsen sleep apnea and disrupt normal sleep architecture. - Sleep hygiene should be reviewed to assess factors that may improve sleep quality. - Weight management and regular exercise should be initiated or continued.  [Electronically signed] 06/26/2023 11:59 AM  Jetty Duhamel MD, ABSM Diplomate, American Board of Sleep Medicine NPI: [id number]                          Jetty Duhamel Diplomate, American Board of Sleep Medicine  ELECTRONICALLY SIGNED ON:  06/26/2023, 11:55 AM [facility] PH: [phone]   FX: [phone] ACCREDITED BY THE AMERICAN ACADEMY OF SLEEP MEDICINE

## 2023-06-28 ENCOUNTER — Encounter: Payer: Self-pay | Admitting: Physical Medicine & Rehabilitation

## 2023-06-28 ENCOUNTER — Other Ambulatory Visit: Payer: Self-pay | Admitting: *Deleted

## 2023-06-28 DIAGNOSIS — G4733 Obstructive sleep apnea (adult) (pediatric): Secondary | ICD-10-CM

## 2023-06-28 NOTE — Progress Notes (Signed)
Referral sent to [facility].

## 2023-07-13 DIAGNOSIS — F1121 Opioid dependence, in remission: Secondary | ICD-10-CM | POA: Diagnosis not present

## 2023-07-21 NOTE — Progress Notes (Unsigned)
[name] [address] [place] [address] Phone: [phone] Subjective:   Bruce Donath, am serving as a scribe for Dr. Antoine Primas.  I'm seeing this patient by the request  of:  Joselyn Arrow, MD  CC: Neck and back pain follow-up  [id number]  Briel Shankman Gubser is a 67 y.o. female coming in with complaint of back and neck pain. OMT 06/16/2023. Patient states that she has been having R shoulder pain. Playing pickleball. Pain over anterior aspect.   Medications patient has been prescribed: None  Taking:         Reviewed prior external information including notes and imaging from previsou exam, outside providers and external EMR if available.   As well as notes that were available from care everywhere and other healthcare systems.  Past medical history, social, surgical and family history all reviewed in electronic medical record.  No pertanent information unless stated regarding to the chief complaint.   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 All Paps normal afterwards   Degenerative disc disease, lumbar 05/02/2020   Generalized anxiety disorder    Herpes genitalis    Herpes labialis    History of calcium pyrophosphate deposition disease (CPPD) 03/26/2020   History of opiate abuse, in remission    History of small bowel obstruction 04/17/2018   partial   Hyperlipidemia with target LDL less than 100 06/17/2011   IBS (irritable bowel syndrome)    Lumbago with sciatica, right side 02/10/2021   Major depressive disorder    Meibomian gland dysfunction (MGD) of both eyes 08/02/2020   Narcotic bowel syndrome (HCC)    Osteopenia 04/2014   T score -1.7  FRAX 7.3%/0.8% stable from prior DEXA 2013   Raynaud's disease 06/17/2011   Recovering alcoholic in remission     S/P LASIK (laser assisted in situ keratomileusis) 08/02/2020   right eye   Spondylolisthesis of lumbar region 02/10/2021   Status post total right knee replacement 10/31/2019   Tubular adenoma of colon 04/21/2016    Allergies  Allergen Reactions   Buprenorphine Hcl Nausea And Vomiting   Morphine And Codeine Nausea Only     Review of Systems:  No headache, visual changes, nausea, vomiting, diarrhea, constipation, dizziness, abdominal pain, skin rash, fevers, chills, night sweats, weight loss, swollen lymph nodes, body aches, joint swelling, chest pain, shortness of breath, mood changes. POSITIVE muscle aches  Objective  Blood pressure 110/82, height 5\' 5"  (1.651 m), weight 115 lb (52.2 kg).   General: No apparent distress alert and oriented x3 mood and affect normal, dressed appropriately.  HEENT: Pupils equal, extraocular movements intact  Respiratory: Patient's speak in full sentences and does not appear short of breath  Cardiovascular: No lower extremity edema, non tender, no erythema  Gait MSK:  Right shoulder exam shows the patient does have a positive impingement noted.  He does have some decreasing range of motion secondary to some voluntary guarding.  Rotator cuff strength though does appear to be on track.  Does have pain with crossover and mild pain with O'Brien's test. Back does have severe loss of lordosis.  Antalgic gait secondary to patient's knee.  Limited muscular skeletal ultrasound was performed and interpreted by Katrinka Blazing,  Seydina Holliman, M  Limited ultrasound shows the patient does have significant hypoechoic changes consistent with a subacromial bursitis that goes across the subscapularis as well as the supraspinatus.  Tendons appear to be unremarkable. Impression: Severe shoulder bursitis  Procedure: Real-time Ultrasound Guided Injection of right glenohumeral  joint Device: GE Logiq Q7  Ultrasound guided injection is preferred based studies that show increased duration, increased effect, greater accuracy, decreased procedural pain, increased response rate with ultrasound guided versus blind injection.  Verbal informed consent obtained.  Time-out conducted.  Noted no overlying erythema, induration, or other signs of local infection.  Skin prepped in a sterile fashion.  Local anesthesia: Topical Ethyl chloride.  With sterile technique and under real time ultrasound guidance:  Joint visualized.  23g 1  inch needle inserted anterior approach. Pictures taken for needle placement. Patient did have injection of  2 cc of 0.5% Marcaine, aspirated 6 cc of straw-colored fluid with debridement injected 1.0 cc of Kenalog 40 mg/dL. Completed without difficulty  Pain immediately resolved suggesting accurate placement of the medication.  Advised to call if fevers/chills, erythema, induration, drainage, or persistent bleeding.  Impression: Technically successful ultrasound guided injection.  Osteopathic findings  C2 flexed rotated and side bent right C6 flexed rotated and side bent left T3 extended rotated and side bent right inhaled rib L2 flexed rotated and side bent right  L5 flexed and side bent left Sacrum right on right       Assessment and Plan:  Acute bursitis of right shoulder With aspiration appears to be potentially pseudogout versus potential cartilage that is likely secondary to an underlying labral pathology.  Patient is to continue to try conservative therapy including home exercises, icing regimen.  Depending on how patient responds we will decide if any advanced imaging is warranted if any increasing weakness or starting to affect daily activities.  Follow-up with me again in 6 to 8 weeks.  Lumbago with sciatica, right side Continues to have chronic pain overall.  Continues to have difficulty with her right knee with post replacement as  well.  And is supposed to be seeing pain management in the near future.  Attempted osteopathic manipulation again.  Discussed icing regimen and home exercises.  Increase activity slowly.  Follow-up again in 6 to 8 weeks otherwise.    Nonallopathic problems  Decision today to treat with OMT was based on Physical Exam  After verbal consent patient was treated with HVLA, ME, FPR techniques in cervical, rib, thoracic, lumbar, and sacral  areas  Patient tolerated the procedure well with improvement in symptoms  Patient given exercises, stretches and lifestyle modifications  See medications in patient instructions if given  Patient will follow up in 4-8 weeks             Note: This dictation was prepared with Dragon dictation along with smaller phrase technology. Any transcriptional errors that result from this process are unintentional.

## 2023-07-22 ENCOUNTER — Ambulatory Visit (INDEPENDENT_AMBULATORY_CARE_PROVIDER_SITE_OTHER): Payer: Medicare Other

## 2023-07-22 ENCOUNTER — Other Ambulatory Visit: Payer: Self-pay

## 2023-07-22 ENCOUNTER — Ambulatory Visit (INDEPENDENT_AMBULATORY_CARE_PROVIDER_SITE_OTHER): Payer: Medicare Other | Admitting: Family Medicine

## 2023-07-22 ENCOUNTER — Encounter: Payer: Self-pay | Admitting: Family Medicine

## 2023-07-22 VITALS — BP 110/82 | Ht 65.0 in | Wt 115.0 lb

## 2023-07-22 DIAGNOSIS — M9904 Segmental and somatic dysfunction of sacral region: Secondary | ICD-10-CM | POA: Diagnosis not present

## 2023-07-22 DIAGNOSIS — M25511 Pain in right shoulder: Secondary | ICD-10-CM

## 2023-07-22 DIAGNOSIS — M7551 Bursitis of right shoulder: Secondary | ICD-10-CM

## 2023-07-22 DIAGNOSIS — M9903 Segmental and somatic dysfunction of lumbar region: Secondary | ICD-10-CM

## 2023-07-22 DIAGNOSIS — M5441 Lumbago with sciatica, right side: Secondary | ICD-10-CM

## 2023-07-22 DIAGNOSIS — G8929 Other chronic pain: Secondary | ICD-10-CM

## 2023-07-22 DIAGNOSIS — M9902 Segmental and somatic dysfunction of thoracic region: Secondary | ICD-10-CM

## 2023-07-22 NOTE — Assessment & Plan Note (Signed)
With aspiration appears to be potentially pseudogout versus potential cartilage that is likely secondary to an underlying labral pathology.  Patient is to continue to try conservative therapy including home exercises, icing regimen.  Depending on how patient responds we will decide if any advanced imaging is warranted if any increasing weakness or starting to affect daily activities.  Follow-up with me again in 6 to 8 weeks.

## 2023-07-22 NOTE — Patient Instructions (Addendum)
Xray today Drained shoulder See me in 7-8 weeks

## 2023-07-22 NOTE — Assessment & Plan Note (Signed)
Continues to have chronic pain overall.  Continues to have difficulty with her right knee with post replacement as well.  And is supposed to be seeing pain management in the near future.  Attempted osteopathic manipulation again.  Discussed icing regimen and home exercises.  Increase activity slowly.  Follow-up again in 6 to 8 weeks otherwise.

## 2023-07-23 LAB — SYNOVIAL FLUID ANALYSIS, COMPLETE
Basophils, %: 0 %
Eosinophils-Synovial: 0 % (ref 0–2)
Lymphocytes-Synovial Fld: 50 % (ref 0–74)
Monocyte/Macrophage: 33 % (ref 0–69)
Neutrophil, Synovial: 0 % (ref 0–24)
Synoviocytes, %: 17 % — ABNORMAL HIGH (ref 0–15)
WBC, Synovial: 235 cells/uL — ABNORMAL HIGH (ref <150)

## 2023-07-29 ENCOUNTER — Institutional Professional Consult (permissible substitution) (INDEPENDENT_AMBULATORY_CARE_PROVIDER_SITE_OTHER): Payer: Medicare Other | Admitting: Otolaryngology

## 2023-08-02 ENCOUNTER — Ambulatory Visit (INDEPENDENT_AMBULATORY_CARE_PROVIDER_SITE_OTHER): Payer: Medicare Other | Admitting: Gastroenterology

## 2023-08-02 ENCOUNTER — Encounter: Payer: Self-pay | Admitting: Gastroenterology

## 2023-08-02 ENCOUNTER — Ambulatory Visit
Admission: RE | Admit: 2023-08-02 | Discharge: 2023-08-02 | Disposition: A | Discharge status: Home / Self Care | Payer: Medicare Other | Source: Ambulatory Visit | Attending: Gastroenterology

## 2023-08-02 ENCOUNTER — Other Ambulatory Visit (HOSPITAL_COMMUNITY): Payer: Self-pay

## 2023-08-02 VITALS — BP 104/60 | HR 80 | Ht 64.25 in | Wt 115.5 lb

## 2023-08-02 DIAGNOSIS — Z8719 Personal history of other diseases of the digestive system: Secondary | ICD-10-CM | POA: Diagnosis not present

## 2023-08-02 DIAGNOSIS — K582 Mixed irritable bowel syndrome: Secondary | ICD-10-CM

## 2023-08-02 DIAGNOSIS — R197 Diarrhea, unspecified: Secondary | ICD-10-CM | POA: Diagnosis not present

## 2023-08-02 DIAGNOSIS — K5900 Constipation, unspecified: Secondary | ICD-10-CM | POA: Diagnosis not present

## 2023-08-02 MED ORDER — NALOXEGOL OXALATE 25 MG PO TABS: 25.0000 mg | ORAL_TABLET | Freq: Every day | ORAL | 1 refills | Status: DC

## 2023-08-02 NOTE — Patient Instructions (Signed)
Take your Movantik 12.5 mg x 1 week the go to 25 mg  Take Miralax 1 capful daily  Your provider has requested that you have an abdominal x ray before leaving today. Please go to the basement floor to our Radiology department for the test.   Due to recent changes in healthcare laws, you may see the results of your imaging and laboratory studies on MyChart before your provider has had a chance to review them.  We understand that in some cases there may be results that are confusing or concerning to you. Not all laboratory results come back in the same time frame and the provider may be waiting for multiple results in order to interpret others.  Please give [place] 48 hours in order for your provider to thoroughly review all the results before contacting the office for clarification of your results.     I appreciate the  opportunity to care for you  Thank You   Marsa Aris , MD

## 2023-08-03 ENCOUNTER — Encounter: Payer: Self-pay | Admitting: Physical Medicine & Rehabilitation

## 2023-08-03 ENCOUNTER — Encounter: Payer: Medicare Other | Attending: Physical Medicine & Rehabilitation | Admitting: Physical Medicine & Rehabilitation

## 2023-08-03 VITALS — BP 111/76 | HR 72 | Ht 64.0 in | Wt 115.0 lb

## 2023-08-03 DIAGNOSIS — M25561 Pain in right knee: Secondary | ICD-10-CM | POA: Diagnosis not present

## 2023-08-03 DIAGNOSIS — G8929 Other chronic pain: Secondary | ICD-10-CM | POA: Diagnosis not present

## 2023-08-03 DIAGNOSIS — M545 Low back pain, unspecified: Secondary | ICD-10-CM | POA: Diagnosis not present

## 2023-08-03 DIAGNOSIS — D0462 Carcinoma in situ of skin of left upper limb, including shoulder: Secondary | ICD-10-CM | POA: Diagnosis not present

## 2023-08-03 DIAGNOSIS — L570 Actinic keratosis: Secondary | ICD-10-CM | POA: Diagnosis not present

## 2023-08-03 DIAGNOSIS — D485 Neoplasm of uncertain behavior of skin: Secondary | ICD-10-CM | POA: Diagnosis not present

## 2023-08-03 DIAGNOSIS — L723 Sebaceous cyst: Secondary | ICD-10-CM | POA: Diagnosis not present

## 2023-08-03 DIAGNOSIS — L812 Freckles: Secondary | ICD-10-CM | POA: Diagnosis not present

## 2023-08-03 DIAGNOSIS — Z85828 Personal history of other malignant neoplasm of skin: Secondary | ICD-10-CM | POA: Diagnosis not present

## 2023-08-03 MED ORDER — DULOXETINE HCL 30 MG PO CPEP
30.0000 mg | ORAL_CAPSULE | Freq: Every day | ORAL | 3 refills | Status: DC
Start: 2023-08-03 — End: 2023-09-16

## 2023-08-03 NOTE — Progress Notes (Unsigned)
Subjective:    Patient ID: Bethany [name], female    DOB: 02-Jan-1956, 67 y.o.   MRN: [id number]  HPI  LIEN [name] is a 67 y.o. 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 (08/02/2020), Arthritis of carpometacarpal [facility]) joint of left thumb (01/18/2020), Cervical dysplasia (1990), Degenerative disc disease, lumbar (05/02/2020), Generalized anxiety disorder, Herpes genitalis, Herpes labialis, History of calcium pyrophosphate deposition disease (CPPD) (03/26/2020), History of opiate abuse, in remission, History of small bowel obstruction (04/17/2018), Hyperlipidemia with target LDL less than 100 (06/17/2011), IBS (irritable bowel syndrome), Lumbago with sciatica, right side (02/10/2021), Major depressive disorder, Meibomian gland dysfunction (MGD) of both eyes (08/02/2020), Narcotic bowel syndrome (HCC), Osteopenia (04/2014), Raynaud's disease (06/17/2011), Recovering alcoholic in remission , S/P LASIK (laser assisted in situ keratomileusis) (08/02/2020), Spondylolisthesis of lumbar region (02/10/2021), Status post total right knee replacement (10/31/2019), and Tubular adenoma of colon (04/21/2016).   They are presenting to PM&R clinic as a new patient for pain management evaluation. They were referred by Dr. Katrinka Blazing for treatment of chronic bilateral lower back pain with right-sided sciatica pain.  Patient reports that she has had chronic pain.  She reports her pain is in her back, history of L4/5 TLIF 2022.  She continues to have pain in her lower back since this time.  Pain is constant she has pain when she stands for a long time.  Worsened by sitting for significant period of time.  She has not had any TKA with continued pain since this time.  She had a bowel obstruction immediately after this surgery that limited her postop therapy until this resolved.  She has had multiple  further issues with bowel obstructions since this time.  She reports she developed scar tissue very quickly and this happened in the knee.  Patient reports she is is very active.  She also plays pickle ball regularly.  She does regular stretching and other exercises she learned with PT previously.  She reports last PT was about a year ago.  She does a lot of walking.  She reports that she does not limit the pain. She continues to be the activities she wants to do. Swelling of joints wounds.  Numbness  She has a hx of IBS, ADHD, Depression and anxiety.     Red flag symptoms: No red flags for back pain endorsed in Hx or ROS  Medications tried: Topical medications- Voltaren gel- didn't help  Nsaids Ibprofen- takes daily Prior use of celebrex and meloxicam Tylenol - helps a little  Opiates-prior history etoh and opiod addiction Gabapentin- Taking for years for anxiety- takes for years  TCAs - does not recall SNRIs  - Denies use  Robaxin and other muscle relaxers- didn't help much   Other treatments: PT/OT - Did not help too much, tried dry needling  TENs unit -Denies use  Injections- R shoulder bursitis- feels great  Surgery - TKR- intestinal blockage after the surgery, limited PT immediately after the surgery  L4/5 TLIF 2022 Prior UDS results: No results found for: "LABOPIA", "COCAINSCRNUR", "LABBENZ", "AMPHETMU", "THCU", "LABBARB"   Pain Inventory Average Pain 5 Pain Right Now 5 My pain is sharp, stabbing, and aching  In the last 24 hours, has pain interfered with the following? General activity 6 Relation with others 2 Enjoyment of life 6 What TIME of day is your pain at its worst? morning  Sleep (in general)  Good  Pain is worse with: walking, inactivity, standing, and some activites Pain improves with: heat/ice Relief from Meds: 2  walk without assistance ability to climb steps?  yes do you drive?  no Has pain when climbing  steps  retired  depression anxiety  Any changes since last visit?  no  Any changes since last visit?  no    Family History  Problem Relation Age of Onset   Hypertension Mother    Dementia Mother        rapid decline; concerns for Creutzfeldt-Jakob disease   Lymphoma Father 100   Breast cancer Sister 70   Hypertension Daughter    Colon cancer Neg Hx    Social History   Socioeconomic History   Marital status: Married    Spouse name: Not on file   Number of children: Not on file   Years of education: 16   Highest education level: Bachelor's degree (e.g., BA, AB, BS)  Occupational History   Occupation: Retired    Comment: Librarian, academic  Tobacco Use   Smoking status: Former    Current packs/day: 0.00    Average packs/day: 1 pack/day for 40.0 years (40.0 ttl pk-yrs)    Types: Cigarettes    Start date: 03/21/1966    Quit date: 03/21/2006    Years since quitting: 17.3   Smokeless tobacco: Never   Tobacco comments:    Uses 10 nicorette lozenge daily  Vaping Use   Vaping status: Never Used  Substance and Sexual Activity   Alcohol use: Not Currently    Comment: history of alcohol abuse/dependence. Sober for past 20 years   Drug use: Not Currently    Types: Oxycodone    Comment: history of opiate abuse. Sober for 1.5 years (after back surgery)   Sexual activity: Yes    Birth control/protection: Surgical    Comment: HYST-1st intercourse 67 yo-More than 5 partners  Other Topics Concern   Not on file  Social History Narrative   Married. 1 dog (golden doodle puppy)   2 daughters, 1 grandson, 1 granddaughter (local).   1 daughter lives here, one in [place].   Retired Librarian, academic      Reviewed 04/2023   Social Determinants of Health   Financial Resource Strain: Not on file  Food Insecurity: No Food Insecurity (05/25/2023)   Hunger Vital Sign    Worried About Running Out of Food in the Last Year: Never true    Ran Out of Food in the Last Year: Never true   Transportation Needs: No Transportation Needs (05/25/2023)   PRAPARE - Administrator, Civil Service (Medical): No    Lack of Transportation (Non-Medical): No  Physical Activity: Not on file  Stress: Not on file  Social Connections: Not on file   Past Surgical History:  Procedure Laterality Date   AUGMENTATION MAMMAPLASTY     BREAST ENHANCEMENT SURGERY  2006   CATARACT EXTRACTION Bilateral    Jan/Feb 2024   COSMETIC SURGERY     EYE SURGERY     lasik od   GYNECOLOGIC CRYOSURGERY  1990   LAPAROTOMY N/A 04/22/2018   Procedure: EXPLORATORY LAPAROTOMY;  Surgeon: Harriette Bouillon, MD;  Location: [facility];  Service: General;  Laterality: N/A;   LAPAROTOMY N/A 05/14/2023   Procedure: EXPLORATORY LAPAROTOMY;  Surgeon: Harriette Bouillon, MD;  Location: [facility];  Service: General;  Laterality: N/A;   LIGAMENT REPAIR Right 09/24/2015   Procedure: RIGHT INDEX METACARPAL PHALANGEAL RADIAL COLLATERAL LIGAMENT REPAIR;  Surgeon:  Betha Loa, MD;  Location: [facility];  Service: Orthopedics;  Laterality: Right;   LYSIS OF ADHESION N/A 05/14/2023   Procedure: LYSIS OF ADHESION, REPAIR OF SMALL BOWEL SEROSAL TEAR x 3 WITH FULL-THICKNESS REPAIR x 1;  Surgeon: Harriette Bouillon, MD;  Location: [facility];  Service: General;  Laterality: N/A;   MASS EXCISION N/A 08/04/2018   Procedure: REMOVAL OF SUTURE FROM ABDOMEN/ERAS PATHWAY;  Surgeon: Harriette Bouillon, MD;  Location:  SURGERY CENTER;  Service: General;  Laterality: N/A;   OOPHORECTOMY     BSO   Right knee arthoscopy Right 09/09/2017   [facility]   SHX 1 Left 02/19/2020   basel cell carcinoma   SHX1 Right 12/11/2020   basal cell carinoma,micronodular pattern   SKIN BIOPSY Right 02/28/2019   Superficial basal carcinoma    SKIN BIOPSY Left 11/24/2021   superficial basal carcinoma of the left forearm   TONSILLECTOMY AND ADENOIDECTOMY     TOTAL KNEE ARTHROPLASTY Right 04/12/2018   TOTAL KNEE ARTHROPLASTY Right 04/12/2018    Procedure: RIGHT TOTAL KNEE ARTHROPLASTY;  Surgeon: Marcene Corning, MD;  Location: [facility];  Service: Orthopedics;  Laterality: Right;   TRANSFORAMINAL LUMBAR INTERBODY FUSION W/ MIS 1 LEVEL N/A 09/03/2021   Procedure: Lumbar four-five Minimally invasive transforaminal lumbar interbody fusion;  Surgeon: Dawley, Alan Mulder, DO;  Location: [facility];  Service: Neurosurgery;  Laterality: N/A;   TUBAL LIGATION     UPPER GASTROINTESTINAL ENDOSCOPY  04/24/2005   VAGINAL HYSTERECTOMY  2005   TVH BSO  adenomyosis   Past Medical History:  Diagnosis Date   ADHD (attention deficit hyperactivity disorder) 09/13/2018   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 All Paps normal afterwards   Degenerative disc disease, lumbar 05/02/2020   Generalized anxiety disorder    Herpes genitalis    Herpes labialis    History of calcium pyrophosphate deposition disease (CPPD) 03/26/2020   History of opiate abuse, in remission    History of small bowel obstruction 04/17/2018   partial   Hyperlipidemia with target LDL less than 100 06/17/2011   IBS (irritable bowel syndrome)    Lumbago with sciatica, right side 02/10/2021   Major depressive disorder    Meibomian gland dysfunction (MGD) of both eyes 08/02/2020   Narcotic bowel syndrome (HCC)    Osteopenia 04/2014   T score -1.7 FRAX 7.3%/0.8% stable from prior DEXA 2013   Raynaud's disease 06/17/2011   Recovering alcoholic in remission     S/P LASIK (laser assisted in situ keratomileusis) 08/02/2020   right eye   Spondylolisthesis of lumbar region 02/10/2021   Status post total right knee replacement 10/31/2019   Tubular adenoma of colon 04/21/2016   BP 111/76   Pulse 72   Ht 5\' 4"  (1.626 m)   Wt 115 lb (52.2 kg)   SpO2 95%   BMI 19.74 kg/m   Opioid Risk Score:   Fall Risk Score:   `1  Depression screen [facility] 2/9     08/03/2023   10:38 AM 06/02/2023    9:35 AM 04/28/2023    1:33 PM 04/23/2022    1:53 PM 12/11/2021    2:52 PM 02/21/2021    3:24 PM 10/31/2019    9:57 AM  Depression screen PHQ 2/9  Decreased Interest 0 3 0 0 1 0 0  Down, Depressed, Hopeless 0 3 0 0 1 0 0  PHQ - 2 Score 0 6 0 0 2 0 0  Altered sleeping 0 3 0 0 3    Tired, decreased energy 0 3 2 0 1    Change in appetite 0 3 0 0 1    Feeling bad or failure about yourself  0 3 0 0 1    Trouble concentrating 1 3 1  0 1    Moving slowly or fidgety/restless 0 0 0 0 1    Suicidal thoughts 0 0 0 0 0    PHQ-9 Score 1 21 3  0 10    Difficult doing work/chores  Very difficult Not difficult at all Not difficult at all Somewhat difficult       Review of Systems  Constitutional: Negative.   HENT: Negative.    Eyes: Negative.   Respiratory: Negative.    Cardiovascular: Negative.   Gastrointestinal: Negative.   Endocrine: Negative.   Genitourinary: Negative.   Musculoskeletal:  Positive for back pain and gait problem.  Skin: Negative.   Allergic/Immunologic: Negative.   Hematological:  Bruises/bleeds easily.  Psychiatric/Behavioral:  Positive for dysphoric mood. The patient is nervous/anxious.   All other systems reviewed and are negative.      Objective:   Physical Exam   Gen: no distress, normal appearing HEENT: oral mucosa pink and moist, NCAT Chest: normal effort, normal rate of breathing Abd: soft, non-distended Ext: no edema Psych: pleasant, normal affect Skin: intact Neuro: alert and awake, follows commands, CN 2-12 grossly intact  RUE: 5/5 Deltoid, 5/5 Biceps, 5/5 Triceps, 5/5 Wrist Ext, 5/5 Grip LUE: 5/5 Deltoid, 5/5 Biceps, 5/5 Triceps, 5/5 Wrist Ext, 5/5 Grip RLE: HF 5/5, KE 5/5, KF 5/5, ADF 5/5, APF 5/5 LLE: HF 5/5, KE 5/5, HF, 5/5, ADF 5/5, APF 5/5 Sensory exam normal for light touch and pain in all 4 limbs. No limb ataxia or cerebellar signs. No abnormal tone appreciated.  No ankle  clonus Musculoskeletal:  L spine paraspinal tenderness R knee -mild tenderness to palpation Mild pain with R knee varus and valgus stress SLR- negative, on right side caused knee and lower back pain Facet loading negative  FABER and FADIR negative, on right side caused knee and lower back pain    10/30/20 MRI L spine IMPRESSION: 1. Degenerative anterolisthesis at L4-L5 with moderate to severe spinal canal stenosis, narrowing of the bilateral subarticular zones, moderate right and mild left neural foraminal narrowing. 2. Mild right neural foraminal narrowing at L3-4.   Assessment & Plan:   1)Chronic Knee pain s/p R TKA 2)Chronic lower back pain, s/p L4/5 TLIF 2022 -Pain is primarily axial  3) history of anxiety/depression  -Consider repeat imaging, MRI if pain does not improve, consider interventional options -Start Cymbalta 30mg  daily for pain, if tolerating can increase to 60mg  -Foods for pain, Tumaric and ginger etc -Order Zynex Nexwave  -Refer to Dr. Wynn Banker for consider of Genicular nerve block/RFA -She would like to avoid opoid medications

## 2023-08-04 DIAGNOSIS — H02413 Mechanical ptosis of bilateral eyelids: Secondary | ICD-10-CM | POA: Diagnosis not present

## 2023-08-04 DIAGNOSIS — H02882 Meibomian gland dysfunction right lower eyelid: Secondary | ICD-10-CM | POA: Diagnosis not present

## 2023-08-04 DIAGNOSIS — H16223 Keratoconjunctivitis sicca, not specified as Sjogren's, bilateral: Secondary | ICD-10-CM | POA: Diagnosis not present

## 2023-08-04 DIAGNOSIS — H04123 Dry eye syndrome of bilateral lacrimal glands: Secondary | ICD-10-CM | POA: Diagnosis not present

## 2023-08-05 DIAGNOSIS — M25561 Pain in right knee: Secondary | ICD-10-CM | POA: Diagnosis not present

## 2023-08-05 DIAGNOSIS — G4733 Obstructive sleep apnea (adult) (pediatric): Secondary | ICD-10-CM | POA: Diagnosis not present

## 2023-08-06 DIAGNOSIS — H04203 Unspecified epiphora, bilateral lacrimal glands: Secondary | ICD-10-CM | POA: Diagnosis not present

## 2023-08-06 DIAGNOSIS — H04123 Dry eye syndrome of bilateral lacrimal glands: Secondary | ICD-10-CM | POA: Diagnosis not present

## 2023-08-06 DIAGNOSIS — M25561 Pain in right knee: Secondary | ICD-10-CM | POA: Diagnosis not present

## 2023-08-07 DIAGNOSIS — M25561 Pain in right knee: Secondary | ICD-10-CM | POA: Diagnosis not present

## 2023-08-09 ENCOUNTER — Other Ambulatory Visit (HOSPITAL_COMMUNITY): Payer: Self-pay

## 2023-08-17 DIAGNOSIS — F1121 Opioid dependence, in remission: Secondary | ICD-10-CM | POA: Diagnosis not present

## 2023-08-18 ENCOUNTER — Ambulatory Visit: Payer: Medicare Other | Admitting: Physician Assistant

## 2023-08-19 ENCOUNTER — Telehealth: Payer: Self-pay | Admitting: Gastroenterology

## 2023-08-19 NOTE — Telephone Encounter (Signed)
Patient called stated the Movantik medication requires PA.

## 2023-08-19 NOTE — Telephone Encounter (Signed)
I called and told Bethany [name] we have a team that works on this. I will forward this message to them.

## 2023-08-23 ENCOUNTER — Other Ambulatory Visit (INDEPENDENT_AMBULATORY_CARE_PROVIDER_SITE_OTHER): Payer: Medicare Other

## 2023-08-23 DIAGNOSIS — Z23 Encounter for immunization: Secondary | ICD-10-CM

## 2023-08-25 ENCOUNTER — Telehealth: Payer: Self-pay

## 2023-08-25 NOTE — Telephone Encounter (Signed)
Pharmacy Patient Advocate Encounter   Received notification from Pt Calls Messages that prior authorization for MOVANTIK 25MG  tablets is required/requested.   Insurance verification completed.   The patient is insured through [employer] .   Per test claim: PA required; PA submitted to [employer] [facility] via CoverMyMeds Key/confirmation #/EOC [facility] Status is pending

## 2023-08-27 NOTE — Telephone Encounter (Signed)
Pharmacy Patient Advocate Encounter  Received notification from [employer] [facility] that Prior Authorization for MOVANTIK 25MG  tablets has been DENIED.  Full denial letter will be uploaded to the media tab. See denial reason below.  Did not meet the plan's criteria for medical necessity due to the following reason:  The diagnosis of irritable bowel syndrome with both constipation and diarrhea are considered experimental or investigational uses for this drug or product. Experimental or investigational indications are those which further studies or clinical trials are necessary to determine the maximum tolerated dose, toxicity, safety, or efficacy as compared with the standard means of treatment.   PA #/Case ID/Reference #: [id number]

## 2023-08-27 NOTE — Telephone Encounter (Signed)
PT is calling to get an update on Movantik authorization. PA has not been sent to [employer] [facility] and she is concerned. Please advise.

## 2023-08-30 ENCOUNTER — Encounter: Payer: Self-pay | Admitting: Family Medicine

## 2023-08-30 ENCOUNTER — Telehealth: Payer: Self-pay | Admitting: Family Medicine

## 2023-08-30 NOTE — Telephone Encounter (Signed)
Patient's Movantik has been denied for the dx IBS with constipation and diarrhea. Please advise what you would like patient changed to Dr. Lavon Paganini.

## 2023-08-30 NOTE — Telephone Encounter (Signed)
Patient also has chronic idiopathic constipation, please check if it will be covered for that indication.  Thank you

## 2023-08-30 NOTE — Telephone Encounter (Signed)
Patient called stating that she was playing tennis yesterday and felt something tear in her shoulder. She would like to see Dr Katrinka Blazing as soon as possible because he understands her past issues and shoulder problems.  (Informed patient that Dr Katrinka Blazing is not in the office today) Please advise.

## 2023-08-31 NOTE — Telephone Encounter (Signed)
Pharmacy Patient Advocate Encounter   Prior authorization for MOVANTIK 25MG  tablets is being re-submitted as requested by provider's office. .   Insurance verification completed.   The patient is insured through [facility] .   Per test claim: PA required; PA submitted to above mentioned insurance via CoverMyMeds Key/confirmation #/EOC [id number] Status is pending

## 2023-08-31 NOTE — Telephone Encounter (Signed)
Sent patient MyChart message.

## 2023-08-31 NOTE — Telephone Encounter (Signed)
Please see message from Dr. Lavon Paganini with new dx of chronic idiopathic constipation

## 2023-09-01 ENCOUNTER — Ambulatory Visit (INDEPENDENT_AMBULATORY_CARE_PROVIDER_SITE_OTHER): Payer: Medicare Other | Admitting: Otolaryngology

## 2023-09-01 ENCOUNTER — Encounter (INDEPENDENT_AMBULATORY_CARE_PROVIDER_SITE_OTHER): Payer: Self-pay

## 2023-09-01 VITALS — Ht 64.25 in | Wt 115.0 lb

## 2023-09-01 DIAGNOSIS — R0989 Other specified symptoms and signs involving the circulatory and respiratory systems: Secondary | ICD-10-CM | POA: Diagnosis not present

## 2023-09-01 DIAGNOSIS — H903 Sensorineural hearing loss, bilateral: Secondary | ICD-10-CM

## 2023-09-01 DIAGNOSIS — R1312 Dysphagia, oropharyngeal phase: Secondary | ICD-10-CM

## 2023-09-01 DIAGNOSIS — R490 Dysphonia: Secondary | ICD-10-CM | POA: Diagnosis not present

## 2023-09-01 MED ORDER — FAMOTIDINE 20 MG PO TABS: 20.0000 mg | ORAL_TABLET | Freq: Every day | ORAL | 2 refills | Status: AC

## 2023-09-01 NOTE — Patient Instructions (Signed)
I have ordered an imaging study for you to complete prior to your next visit. Please call [facility] Scheduling at [phone] to schedule your imaging if you have not received a call within 24 hours. If you are unable to complete your imaging study prior to your next scheduled visit please call our office to let us know.

## 2023-09-01 NOTE — Telephone Encounter (Signed)
Yes, agree she has been on and off on opiates and has OIC

## 2023-09-01 NOTE — Telephone Encounter (Signed)
Pharmacy Patient Advocate Encounter  Received notification from [facility] that Prior Authorization for MOVANTIK 25MG  has been DENIED.  See denial reason below. No denial letter attached in CMM. Will attache denial letter to Media tab once received.   PA #/Case ID/Reference #: [id number]

## 2023-09-01 NOTE — Progress Notes (Signed)
Dear Dr. Lynelle Doctor, Here is my assessment for our mutual patient, Bethany [name]. Thank you for allowing me the opportunity to care for your patient. Please do not hesitate to contact me should you have any other questions. Sincerely, Dr. Jovita Kussmaul  Otolaryngology Clinic Note Referring provider: Dr. Lynelle Doctor HPI:  Bethany [name] is a 67 y.o. female kindly referred by Dr. Lynelle Doctor for evaluation of hoarseness Saw Dr. Lynelle Doctor 06/2023 for hoarseness and throat clearing.  Reports ongoing for past year, worsening - voice worse with voice use and end of the day. Never goes out completely. Some "tightness in her throat" - but not lump in her throat. Query dysphagia as intermittent food sticking and sometimes thicker liquids - slowly getting worse. Doesn't feel like there is PND, no CRS symptoms. Throat clearing - no triggers such as perfumes, temp/pressure, no tickle but worst when throat is dry. Drinks lots of coffee - 4 cups/day; water - 20 oz. Throat does feel dry Otherwise denies aspiration episodes, PNA, odynophagia, hemoptysis, ear pain, no neck masses, no SOB.  Query some stressors including relate to her health recently with surgery.  No otologic symptoms  Former smoker - 30 pack year history, quit 17 years ago (uses nicotine) Current ENT meds use: Zyrtec daily for AR; Protonix for GERD (not anymore) and sees Dr. Lavon Paganini (GI) - multiple GI problems including SBO  H&N Surgery: Tonsillectomy and adenoidectomy (Peds) Personal or FHx of bleeding dz or anesthesia difficulty: no  PMHx: prior smoker, OSA, SBO, ADHD, IBS, Back and Knee, Mood disorder Independent Review of Additional Tests or Records:  PCP Notes reviewed (06/2023): summarized above; L cerumen impaction - told to use debrox Audio 11/30/2022 rev independently: decreased hearing for many years and tinnitus (b/l). High pitch, otherwise no symptoms  Otoscopy showed a clear view of the tympanic membranes, bilaterally Tympanometry results were  consistent with normal middle ear pressure and normal tympanic membrane mobility (Type A), bilaterally.  Audiometric testing was completed using Conventional Audiometry techniques with insert earphones and TDH headphones. Test results are consistent with normal hearing sensitivity sloping to a moderate sensorineural hearing loss, bilaterally. Speech Recognition Thresholds were obtained at 20 dB HL in the right ear and at 20  dB HL in the left ear. Word Recognition Testing was completed at 70 dB HL and Danessa scored 84% in the right ear and 100% in the left ear. Asymmetric word recognition noted, worse in the right ear.       MRI 2022 independently reviewed: (no significant sinus disease noted), mastoids well aerated; no obvious large retrocochlear lesions noted    PMH/Meds/All/SocHx/FamHx/ROS:   Past Medical History:  Diagnosis Date   ADHD (attention deficit hyperactivity disorder) 09/13/2018   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 08/02/2020   Arthritis of carpometacarpal [facility]) joint of left thumb 01/18/2020   Injected January 18, 2020   Cervical dysplasia 1990   CIN 2 subsequent cryosurgery. All Paps normal afterwards   Degenerative disc disease, lumbar 05/02/2020   Generalized anxiety disorder    Herpes genitalis    Herpes labialis    History of calcium pyrophosphate deposition disease (CPPD) 03/26/2020   History of opiate abuse, in remission    History of small bowel obstruction 04/17/2018   partial   Hyperlipidemia with target LDL less than 100 06/17/2011   IBS (irritable bowel syndrome)    Lumbago with sciatica, right side 02/10/2021   Major depressive disorder    Meibomian gland dysfunction (MGD) of both eyes 08/02/2020  Narcotic bowel syndrome (HCC)    Osteopenia 04/2014   T score -1.7 FRAX 7.3%/0.8% stable from prior DEXA 2013   Raynaud's disease 06/17/2011   Recovering alcoholic in remission     S/P LASIK (laser assisted in situ keratomileusis)  08/02/2020   right eye   Spondylolisthesis of lumbar region 02/10/2021   Status post total right knee replacement 10/31/2019   Tubular adenoma of colon 04/21/2016     Past Surgical History:  Procedure Laterality Date   AUGMENTATION MAMMAPLASTY     BREAST ENHANCEMENT SURGERY  2006   CATARACT EXTRACTION Bilateral    Jan/Feb 2024   COSMETIC SURGERY     EYE SURGERY     lasik od   GYNECOLOGIC CRYOSURGERY  1990   LAPAROTOMY N/A 04/22/2018   Procedure: EXPLORATORY LAPAROTOMY;  Surgeon: Harriette Bouillon, MD;  Location: [facility] OR;  Service: General;  Laterality: N/A;   LAPAROTOMY N/A 05/14/2023   Procedure: EXPLORATORY LAPAROTOMY;  Surgeon: Harriette Bouillon, MD;  Location: [facility] OR;  Service: General;  Laterality: N/A;   LIGAMENT REPAIR Right 09/24/2015   Procedure: RIGHT INDEX METACARPAL PHALANGEAL RADIAL COLLATERAL LIGAMENT REPAIR;  Surgeon: Betha Loa, MD;  Location: [facility];  Service: Orthopedics;  Laterality: Right;   LYSIS OF ADHESION N/A 05/14/2023   Procedure: LYSIS OF ADHESION, REPAIR OF SMALL BOWEL SEROSAL TEAR x 3 WITH FULL-THICKNESS REPAIR x 1;  Surgeon: Harriette Bouillon, MD;  Location: [facility] OR;  Service: General;  Laterality: N/A;   MASS EXCISION N/A 08/04/2018   Procedure: REMOVAL OF SUTURE FROM ABDOMEN/ERAS PATHWAY;  Surgeon: Harriette Bouillon, MD;  Location: [facility];  Service: General;  Laterality: N/A;   OOPHORECTOMY     BSO   Right knee arthoscopy Right 09/09/2017   [facility]   SHX 1 Left 02/19/2020   basel cell carcinoma   SHX1 Right 12/11/2020   basal cell carinoma,micronodular pattern   SKIN BIOPSY Right 02/28/2019   Superficial basal carcinoma    SKIN BIOPSY Left 11/24/2021   superficial basal carcinoma of the left forearm   TONSILLECTOMY AND ADENOIDECTOMY     TOTAL KNEE ARTHROPLASTY Right 04/12/2018   TOTAL KNEE ARTHROPLASTY Right 04/12/2018   Procedure: RIGHT TOTAL KNEE ARTHROPLASTY;  Surgeon: Marcene Corning, MD;  Location: [facility];   Service: Orthopedics;  Laterality: Right;   TRANSFORAMINAL LUMBAR INTERBODY FUSION W/ MIS 1 LEVEL N/A 09/03/2021   Procedure: Lumbar four-five Minimally invasive transforaminal lumbar interbody fusion;  Surgeon: Dawley, Alan Mulder, DO;  Location: [facility];  Service: Neurosurgery;  Laterality: N/A;   TUBAL LIGATION     UPPER GASTROINTESTINAL ENDOSCOPY  04/24/2005   VAGINAL HYSTERECTOMY  2005   TVH BSO  adenomyosis    Family History  Problem Relation Age of Onset   Hypertension Mother    Dementia Mother        rapid decline; concerns for Creutzfeldt-Jakob disease   Lymphoma Father 80   Breast cancer Sister 71   Hypertension Daughter    Colon cancer Neg Hx      Social Connections: Not on file       Current Outpatient Medications:    acetaminophen (TYLENOL) 500 MG tablet, Take 1,000 mg by mouth every 6 (six) hours as needed for moderate pain., Disp: , Rfl:    bisacodyl (DULCOLAX) 10 MG suppository, Unwrap and place 1 suppository (10 mg total) rectally daily as needed for moderate constipation., Disp: 20 suppository, Rfl: 0   bisacodyl (DULCOLAX) 5 MG EC tablet, Take 1 tablet (5  mg total) by mouth daily as needed for moderate constipation. (Patient taking differently: Take 5 mg by mouth as needed for moderate constipation.), Disp: 15 tablet, Rfl: 0   cycloSPORINE (RESTASIS) 0.05 % ophthalmic emulsion, Place 1 drop into both eyes in the morning, at noon, in the evening, and at bedtime., Disp: , Rfl:    docusate sodium (COLACE) 100 MG capsule, Take 2 capsules (200 mg total) by mouth 2 (two) times daily for 8 days then as directed by MD, Disp: 100 capsule, Rfl: 0   famotidine (PEPCID) 20 MG tablet, Take 1 tablet (20 mg total) by mouth at bedtime., Disp: 30 tablet, Rfl: 2   gabapentin (NEURONTIN) 600 MG tablet, Take 600-1,200 mg by mouth See admin instructions. Take 600 mg by mouth in the morning and 1200 mg at night, Disp: , Rfl:    hydrocortisone 2.5 % cream, Apply 1 Application topically 2 (two)  times daily as needed (hemorrhoids)., Disp: , Rfl:    ibuprofen (ADVIL) 800 MG tablet, TAKE 1 TABLET(800 MG) BY MOUTH EVERY 8 HOURS AS NEEDED, Disp: 90 tablet, Rfl: 0   lamoTRIgine (LAMICTAL) 100 MG tablet, Take 100 mg by mouth 2 (two) times daily. , Disp: , Rfl: 0   MAGNESIUM CITRATE PO, Take 250 mg by mouth daily., Disp: , Rfl:    Multiple Vitamins-Minerals (ADULT GUMMY PO), Take 1 each by mouth at bedtime. Vitamin B12 and Vitamin D gummy, Disp: , Rfl:    naloxegol oxalate (MOVANTIK) 25 MG TABS tablet, Take 1 tablet (25 mg total) by mouth daily., Disp: 30 tablet, Rfl: 1   ondansetron (ZOFRAN-ODT) 4 MG disintegrating tablet, Take 1 tablet (4 mg total) by mouth every 6 (six) hours as needed for nausea., Disp: 20 tablet, Rfl: 0   polyethylene glycol (MIRALAX) 17 g packet, Take 17 g by mouth daily., Disp: 14 each, Rfl: 0   simethicone (MYLICON) 80 MG chewable tablet, Chew 1 tablet (80 mg total) by mouth every 6 (six) hours as needed for flatulence., Disp: 100 tablet, Rfl: 0   simvastatin (ZOCOR) 20 MG tablet, TAKE 1 TABLET(20 MG) BY MOUTH DAILY, Disp: 90 tablet, Rfl: 3   traZODone (DESYREL) 50 MG tablet, Take 50 mg by mouth at bedtime., Disp: , Rfl:    valACYclovir (VALTREX) 1000 MG tablet, Take by mouth as needed., Disp: , Rfl:    VEVYE 0.1 % SOLN, Apply 1 drop to eye 2 (two) times daily., Disp: , Rfl:    vitamin C (ASCORBIC ACID) 500 MG tablet, Take 500 mg by mouth every evening., Disp: , Rfl:    zinc gluconate 50 MG tablet, Take 50 mg by mouth every evening., Disp: , Rfl:    diphenoxylate-atropine (LOMOTIL) 2.5-0.025 MG tablet, Take 1 tablet by mouth 4 (four) times daily as needed for diarrhea or loose stools. (Patient not taking: Reported on 09/01/2023), Disp: 90 tablet, Rfl: 0   DULoxetine (CYMBALTA) 30 MG capsule, Take 1-2 capsules (30-60 mg total) by mouth daily. If tolerating 1 cap (30mg ) daily  for 3 weeks, can increase to 2 caps daily (Patient not taking: Reported on 09/01/2023), Disp: 60  capsule, Rfl: 3   Lotilaner (XDEMVY) 0.25 % SOLN, Place 1 drop into both eyes in the morning and at bedtime. (Patient not taking: Reported on 09/01/2023), Disp: , Rfl:    pantoprazole (PROTONIX) 40 MG tablet, Take 1 tablet (40 mg total) by mouth daily. (Patient not taking: Reported on 09/01/2023), Disp: 30 tablet, Rfl: 0   Physical Exam:   Ht  5' 4.25" (1.632 m)   Wt 115 lb (52.2 kg)   BMI 19.59 kg/m    Salient findings:  CN II-XII intact  Bilateral EAC clear with modest non-obstructing cerumen and TM intact with well pneumatized middle ear spaces Weber 512: midline AC>BC 512 hz b/l Anterior rhinoscopy: Septum relatively midline; bilateral inferior turbinates without significant hypertrophy No lesions of oral cavity/oropharynx; dentition intact No obviously palpable neck masses/lymphadenopathy/thyromegaly No respiratory distress or stridor; voice quality class II, no breaks, vocal fry  Procedures:  Procedure Note Pre-procedure diagnosis:  Dysphonia, dysphagia Post-procedure diagnosis: Same Procedure: Transnasal Fiberoptic Laryngoscopy, CPT 16109 - Mod 25 Indication: dysphonia, throat clearing, dysphagia Complications: None apparent EBL: 0 mL Date: 09/01/23   The procedure was undertaken to further evaluate the patient's complaint of dysphonia, throat clearing and dysphagia with prior tobacco use, with mirror exam inadequate for appropriate examination due to gag reflex and poor patient tolerance  Procedure:  Patient was identified as correct patient. Verbal consent was obtained. The nose was sprayed with oxymetazoline and 4% lidocaine. The The flexible laryngoscope was passed through the nose to view the nasal cavity, pharynx (oropharynx, hypopharynx) and larynx.  The larynx was examined at rest and during multiple phonatory tasks. Documentation was obtained and reviewed with patient. The scope was removed. The patient tolerated the procedure well.  Findings: The nasal cavity and  nasopharynx did not reveal any masses or lesions, mucosa appeared to be without obvious lesions. The tongue base, pharyngeal walls, piriform sinuses, vallecula, epiglottis and postcricoid region are normal in appearance. The visualized portion of the subglottis and proximal trachea is widely patent. The vocal folds are mobile bilaterally. There are no lesions on the free edge of the vocal folds nor elsewhere in the larynx worrisome for malignancy.  AP compression suggestive of MTD; mild thinning of VF    Electronically signed by: Read Drivers, MD 09/01/2023 9:00 AM   Impression & Plans:  Bethany [name] is a 67 y.o. female with prior history of smoking and multiple GI problems with: Dysphonia Throat clearing - mostly when throat is dry  - most likely due to component of laryngeal hygiene and some mild VF atrophy and MTD. Query if she has LPR given some temporary benefit with PPI. We discussed management strategies including improving laryngeal hydration, throat clearing strategies (sipping water), and brief voice use techniques - Start pepcid 20mg  PO at bedtime - Reflux gourmet BID PRN after meals Dysphagia - sensation of food sticking in throat and some worsening general swallowing with solids and liquids; unclear etiology, may be dysmotility; we discussed need for further workup - will proceed with MBS 4. B/l Symmetric SNHL - not a problem currently; will follow up as needed for this  - f/u in 6-8 weeks after swallow eval  MDM:  Level 4 Complexity/Problems addressed: mod - unknown prognosis requiring further testing Data complexity: independent interpretation audio and MRI - mod - Morbidity: mod - Prescription Drug prescribed or managed: yes     Thank you for allowing me the opportunity to care for your patient. Please do not hesitate to contact me should you have any other questions.  Sincerely, Jovita Kussmaul, MD Otolarynoglogist (ENT), [facility] Phone:  [phone] Fax: [phone]  09/01/2023, 9:00 AM

## 2023-09-01 NOTE — Telephone Encounter (Signed)
She has a long history of the use opoid medications, and injections such as Dilaudid/Hydromorphone    Oxycodone-Percocet ,  Roxicodone , Roxicet , Hydrocodone   in her medication history and has been approved for Movantik in the past.  Dr Lavon Paganini Please advise

## 2023-09-01 NOTE — Telephone Encounter (Signed)
Rx team has resubmitted claim on 10/29

## 2023-09-01 NOTE — Telephone Encounter (Signed)
Inbound call from patient stating she received a letter today stating medication was denied. Patient is requesting a follow up call. Please advise, thank you.

## 2023-09-01 NOTE — Telephone Encounter (Signed)
Dx Opiate Induced constipation

## 2023-09-01 NOTE — Telephone Encounter (Signed)
Rx Team Please resubmit Patient has long hx of Opiate use causing constipation  Thanks    These medications are in her medication history

## 2023-09-02 ENCOUNTER — Other Ambulatory Visit (HOSPITAL_COMMUNITY): Payer: Self-pay | Admitting: *Deleted

## 2023-09-02 DIAGNOSIS — R1310 Dysphagia, unspecified: Secondary | ICD-10-CM

## 2023-09-02 NOTE — Telephone Encounter (Signed)
Patient called stating that prescriptions needs to be change just to diarrhea, she believes that the reason it was denied was because it was said it was for constipation. Patient is requesting a call from auth team to discuss. Please advise.

## 2023-09-02 NOTE — Telephone Encounter (Signed)
Called pt to go over reasons for denial. Pt explained to me that she has both constipation and diarrhea. She was in hospital for a 2 week stay where she received opiods until being discharged. She was given Movantik for OIC and loperamide for the diarrhea. Patients formulary has changed from last year to this year, therefore the diagnosis used last year is not covered. I consulted with my pharmacist who states there is some data out there that suggests constipation associated with long term use of opioids can persist even after the opioids are stopped.  There doesn't seem to be a consensus on how long after. Can you please make a chart note that sums that up (OIC) and the constipation is due to history of opioid use? Please let me know when that has been updated and I will resubmit PA with updated Dx.

## 2023-09-02 NOTE — Telephone Encounter (Signed)
Dr Lavon Paganini can we addend patients last office note diagnosis to include OIC. ?

## 2023-09-02 NOTE — Progress Notes (Signed)
[name] [address] [place] [address] Phone: [phone] Subjective:   Bethany [name], am serving as a scribe for Dr. Antoine Primas.  I'm seeing this patient by the request  of:  Joselyn Arrow, MD  CC: Right shoulder pain and back pain  [id number]  Bethany [name] is a 67 y.o. female coming in with complaint of back and neck pain. OMT and R shoulder pain. Patient states 2 weeks ago felt something tear in shoulder right side. Everything else about the same.  Medications patient has been prescribed: None  Taking:         Reviewed prior external information including notes and imaging from previsou exam, outside providers and external EMR if available.   As well as notes that were available from care everywhere and other healthcare systems.  Past medical history, social, surgical and family history all reviewed in electronic medical record.  No pertanent information unless stated regarding to the chief complaint.   Past Medical History:  Diagnosis Date   ADHD (attention deficit hyperactivity disorder) 09/13/2018   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 08/02/2020   Arthritis of carpometacarpal [facility]) joint of left thumb 01/18/2020   Injected January 18, 2020   Cervical dysplasia 1990   CIN 2 subsequent cryosurgery. All Paps normal afterwards   Degenerative disc disease, lumbar 05/02/2020   Generalized anxiety disorder    Herpes genitalis    Herpes labialis    History of calcium pyrophosphate deposition disease (CPPD) 03/26/2020   History of opiate abuse, in remission    History of small bowel obstruction 04/17/2018   partial   Hyperlipidemia with target LDL less than 100 06/17/2011   IBS (irritable bowel syndrome)    Lumbago with sciatica, right side 02/10/2021   Major depressive disorder    Meibomian gland dysfunction (MGD) of both eyes 08/02/2020   Narcotic bowel syndrome (HCC)    Osteopenia 04/2014   T  score -1.7 FRAX 7.3%/0.8% stable from prior DEXA 2013   Raynaud's disease 06/17/2011   Recovering alcoholic in remission     S/P LASIK (laser assisted in situ keratomileusis) 08/02/2020   right eye   Spondylolisthesis of lumbar region 02/10/2021   Status post total right knee replacement 10/31/2019   Tubular adenoma of colon 04/21/2016    Allergies  Allergen Reactions   Buprenorphine Hcl Nausea And Vomiting   Morphine And Codeine Nausea Only     Review of Systems:  No headache, visual changes, nausea, vomiting, diarrhea, constipation, dizziness, abdominal pain, skin rash, fevers, chills, night sweats, weight loss, swollen lymph nodes, body aches, joint swelling, chest pain, shortness of breath, mood changes. POSITIVE muscle aches  Objective  Blood pressure 110/68, pulse 62, height 5\' 4"  (1.626 m), weight 116 lb (52.6 kg), SpO2 97%.   General: No apparent distress alert and oriented x3 mood and affect normal, dressed appropriately.  HEENT: Pupils equal, extraocular movements intact  Respiratory: Patient's speak in full sentences and does not appear short of breath  Cardiovascular: No lower extremity edema, non tender, no erythema  MSK:  Back tender to paraspinal musculature of the lumbar spine generative changes noted as well recent pain with extension noted.  Negative straight leg test. \Right foot exam shows patient does have mild edema noted positive.  Full strength noted.  Limited muscular skeletal ultrasound was performed and interpreted by Antoine Primas, M  limited ultrasound shows hypoechoic changes that is consistent with potential significant cyst formation noted.  Patient does have some degenerative due to the rotator cuff.  Questionable anterior labral pathology. Impression: Likely consistent with anterior labral tear    Procedure: Real-time Ultrasound Guided Injection of right anterior shoulder Device: GE Logiq Q7 Ultrasound guided injection is preferred based studies  that show increased duration, increased effect, greater accuracy, decreased procedural pain, increased response rate, and decreased cost with ultrasound guided versus blind injection.  Verbal informed consent obtained.  Time-out conducted.  Noted no overlying erythema, induration, or other signs of local infection.  Skin prepped in a sterile fashion.  Local anesthesia: Topical Ethyl chloride.  With sterile technique and under real time ultrasound guidance: With a 21-gauge 2 inch needle injected with 0.5 cc of 0.5% Marcaine and then aspirated gel like material with some synovial fluid and then injected 1 cc of Kenalog 40 mg/mL. Completed without difficulty  Pain immediately resolved suggesting accurate placement of the medication.  Advised to call if fevers/chills, erythema, induration, drainage, or persistent bleeding.  Impression: Technically successful ultrasound guided injection.  Osteopathic findings  T3 extended rotated and side bent right inhaled rib T9 extended rotated and side bent left L2 flexed rotated and side bent right Sacrum right on right     Assessment and Plan:  Acute bursitis of right shoulder Consider narrowing for possible cyst formation and palliative.  Possibly ganglion.  Had difficulty with aspiration.  Will send it to the lab for further evaluation.  Discussed with patient that the pain does not seem to happen I do feel we need to consider the possibility of advanced patient is in agreement with the plan.  Discussed icing regimen and home exercises otherwise follow-up.  6 to 8 weeks  Lumbago with sciatica, right side Responded extremely well to osteopathic manipulation.  Discussed icing regimen and home exercises, discussed which activities to do and which ones to avoid.  Increase activity slowly follow-up again in 6 to 8 weeks    Nonallopathic problems  Decision today to treat with OMT was based on Physical Exam  After verbal consent patient was treated with  HVLA, ME, FPR techniques in  thoracic, lumbar, and sacral  areas  Patient tolerated the procedure well with improvement in symptoms  Patient given exercises, stretches and lifestyle modifications  See medications in patient instructions if given  Patient will follow up in 4-8 weeks     The above documentation has been reviewed and is accurate and complete Judi Saa, DO         Note: This dictation was prepared with Dragon dictation along with smaller phrase technology. Any transcriptional errors that result from this process are unintentional.

## 2023-09-03 ENCOUNTER — Encounter: Payer: Self-pay | Admitting: Physical Medicine & Rehabilitation

## 2023-09-03 ENCOUNTER — Encounter: Payer: Medicare Other | Attending: Physical Medicine & Rehabilitation | Admitting: Physical Medicine & Rehabilitation

## 2023-09-03 ENCOUNTER — Ambulatory Visit: Payer: Medicare Other | Admitting: Family Medicine

## 2023-09-03 VITALS — BP 118/80 | HR 64 | Temp 97.9°F | Wt 116.0 lb

## 2023-09-03 DIAGNOSIS — M25561 Pain in right knee: Secondary | ICD-10-CM | POA: Diagnosis not present

## 2023-09-03 DIAGNOSIS — G8929 Other chronic pain: Secondary | ICD-10-CM | POA: Diagnosis not present

## 2023-09-03 DIAGNOSIS — G4733 Obstructive sleep apnea (adult) (pediatric): Secondary | ICD-10-CM | POA: Diagnosis not present

## 2023-09-03 MED ORDER — LIDOCAINE HCL (PF) 2 % IJ SOLN
2.0000 mL | Freq: Once | INTRAMUSCULAR | Status: AC
Start: 2023-09-03 — End: 2023-09-03
  Administered 2023-09-03: 2 mL

## 2023-09-03 MED ORDER — IOHEXOL 180 MG/ML  SOLN
3.0000 mL | Freq: Once | INTRAMUSCULAR | Status: AC
  Administered 2023-09-03: 3 mL via INTRAVENOUS

## 2023-09-03 MED ORDER — BUPIVACAINE HCL 0.5 % IJ SOLN
4.0000 mL | Freq: Once | INTRAMUSCULAR | Status: DC
Start: 2023-09-03 — End: 2023-09-03

## 2023-09-03 MED ORDER — LIDOCAINE HCL 1 % IJ SOLN
5.0000 mL | Freq: Once | INTRAMUSCULAR | Status: AC
Start: 2023-09-03 — End: 2023-09-03
  Administered 2023-09-03: 5 mL

## 2023-09-03 NOTE — Progress Notes (Signed)
Genicular nerve block x 3, Upper medial, Upper lateral , and Lower Medial under fluoroscopic guidance  Indication Chronic post operative pain in the Knee, pain postop total knee replacement which has not responded to conservative management such as physical therapy and medication management  Informed consent was obtained after describing risks and to the procedure to the patient these include bleeding bruising and infection, patient elects to proceed and has given written consent. Patient placed supine on the fluoroscopy table AP images of the knee joint were obtained. A 25-gauge 1.5 inch needle was used to anesthetize the skin and subcutaneous tissue with 1% lidocaine, 1.5 cc at each of 3 locations. Then a 22-gauge 3.5" spinal needle was inserted targeting the junction of the medial flare of the tibia with the shaft of the tibia, bone contact made and confirmed with lateral imaging. Then Omnipaque 180 x0.5 mL demonstrated no intravascular uptake followed by injection of 1.57m .5% bupivacaine. Then the junction of the medial epicondyles of the femur with the femoral shaft was targeted needle was advanced under fluoroscopic guidance until bone contact. Appropriate depth was obtained and confirmed with lateral images. Then Omnipaque 180 x0.5 mL demonstrated no intravascular uptake followed by injection of 1.521mof .5% bupivacaine. Then the junction of the lateral femoral condyle with the femoral shaft was targeted. 22-gauge 3.5 inch needle was advanced under fluoroscopic guidance until bone contact. Appropriate depth was confirmed with lateral imaging. 0.5 mL of Omnipaque 180 injected followed by injection of 1.5 cc of .5% bupivacaine solution. Patient tolerated procedure well. Post procedure instructions given  Lidocaine 1% with preservative 4.83m52mmnipaque 180 1.83ml32mpivacaine 0.5% 4.83ml62m

## 2023-09-03 NOTE — Progress Notes (Signed)
  PROCEDURE RECORD [facility]   Name: MARLYCE MCDOUGALD DOB:05-02-1956 MRN: [id number]  Date:09/03/2023  Physician: Claudette Laws, MD    Nurse/CMA: Nzinga Ferran RMA  Allergies:  Allergies  Allergen Reactions   Buprenorphine Hcl Nausea And Vomiting   Morphine And Codeine Nausea Only    Consent Signed: No.  Is patient diabetic? No.  CBG today? .  Pregnant: No. LMP: No LMP recorded. Patient has had a hysterectomy. (age 68-55)  Anticoagulants: no Anti-inflammatory: no Antibiotics: no  Procedure: Genicular Nerve Block  Position: Supine Start Time: 11:48 AM  End Time: 11:59 AM  Fluoro Time: 38  RN/CMA Kacie Huxtable RMA Tao Satz RMA    Time 11:19AM 12;04 PM    BP 118/80 128/83    Pulse 64 60    Respirations 16 16    O2 Sat 99 100    S/S 6 6    Pain Level 3/10 0/10     D/C home with Self, patient A & O X 3, D/C instructions reviewed, and sits independently.

## 2023-09-07 ENCOUNTER — Encounter: Payer: Self-pay | Admitting: Family Medicine

## 2023-09-07 ENCOUNTER — Other Ambulatory Visit: Payer: Self-pay

## 2023-09-07 ENCOUNTER — Ambulatory Visit (INDEPENDENT_AMBULATORY_CARE_PROVIDER_SITE_OTHER): Payer: Medicare Other | Admitting: Family Medicine

## 2023-09-07 VITALS — BP 110/68 | HR 62 | Ht 64.0 in | Wt 116.0 lb

## 2023-09-07 DIAGNOSIS — M4316 Spondylolisthesis, lumbar region: Secondary | ICD-10-CM

## 2023-09-07 DIAGNOSIS — M9902 Segmental and somatic dysfunction of thoracic region: Secondary | ICD-10-CM

## 2023-09-07 DIAGNOSIS — M2550 Pain in unspecified joint: Secondary | ICD-10-CM

## 2023-09-07 DIAGNOSIS — M5441 Lumbago with sciatica, right side: Secondary | ICD-10-CM | POA: Diagnosis not present

## 2023-09-07 DIAGNOSIS — G8929 Other chronic pain: Secondary | ICD-10-CM | POA: Diagnosis not present

## 2023-09-07 DIAGNOSIS — M9904 Segmental and somatic dysfunction of sacral region: Secondary | ICD-10-CM | POA: Diagnosis not present

## 2023-09-07 DIAGNOSIS — M25511 Pain in right shoulder: Secondary | ICD-10-CM

## 2023-09-07 DIAGNOSIS — M7551 Bursitis of right shoulder: Secondary | ICD-10-CM

## 2023-09-07 DIAGNOSIS — M9903 Segmental and somatic dysfunction of lumbar region: Secondary | ICD-10-CM

## 2023-09-07 NOTE — Telephone Encounter (Signed)
Addendum done, thanks

## 2023-09-07 NOTE — Assessment & Plan Note (Signed)
Responded extremely well to osteopathic manipulation.  Discussed icing regimen and home exercises, discussed which activities to do and which ones to avoid.  Increase activity slowly follow-up again in 6 to 8 weeks

## 2023-09-07 NOTE — Assessment & Plan Note (Signed)
Consider narrowing for possible cyst formation and palliative.  Possibly ganglion.  Had difficulty with aspiration.  Will send it to the lab for further evaluation.  Discussed with patient that the pain does not seem to happen I do feel we need to consider the possibility of advanced patient is in agreement with the plan.  Discussed icing regimen and home exercises otherwise follow-up.  6 to 8 weeks

## 2023-09-07 NOTE — Patient Instructions (Addendum)
Drained and injected R shoulder today MRI ordered When we receive your results we will contact you.

## 2023-09-08 LAB — SYNOVIAL FLUID ANALYSIS, COMPLETE
Basophils, %: 0 %
Eosinophils-Synovial: 0 % (ref 0–2)
Lymphocytes-Synovial Fld: 35 % (ref 0–74)
Monocyte/Macrophage: 40 % (ref 0–69)
Neutrophil, Synovial: 25 % — ABNORMAL HIGH (ref 0–24)
Synoviocytes, %: 0 % (ref 0–15)
WBC, Synovial: 245 {cells}/uL — ABNORMAL HIGH (ref <150)

## 2023-09-13 ENCOUNTER — Other Ambulatory Visit (HOSPITAL_COMMUNITY): Payer: Self-pay

## 2023-09-13 NOTE — Telephone Encounter (Signed)
Pharmacy Patient Advocate Encounter   Received notification from Pt Calls Messages that prior authorization for Movantik 25mg  is required/requested.   Insurance verification completed.   The patient is insured through [employer] [facility] .   Per test claim: PA required; PA submitted to above mentioned insurance via CoverMyMeds Key/confirmation #/EOC [id number] Status is pending

## 2023-09-13 NOTE — Telephone Encounter (Signed)
Dr Lavon Paganini addended her last office note :        Addendum: Patient has h/o chronic intermittent opiate use, has component for opiate induced constipation, will benefit from [facility], she had good results with improvement of her symptoms on Movantik previously

## 2023-09-13 NOTE — Telephone Encounter (Signed)
Pharmacy Patient Advocate Encounter  Received notification from [facility] that Prior Authorization for Movantik 25mg  has been APPROVED from 09/13/2023 to 09/12/2024. Ran test claim, Copay is $50.00. This test claim was processed through [facility]- copay amounts may vary at other pharmacies due to pharmacy/plan contracts, or as the patient moves through the different stages of their insurance plan.   PA #/Case ID/Reference #: [id number]

## 2023-09-13 NOTE — Telephone Encounter (Signed)
Pharmacy Patient Advocate Encounter   Received notification from Pt Calls Messages that prior authorization for MOVANTIK 25MG  tablet  is required/requested.   Insurance verification completed.   The patient is insured through [employer] .   Per test claim: PA required; PA started via CoverMyMeds. KEY [id number] . Please see clinical question(s) below that I am not finding the answer to in her chart and advise.    Is the constipation secondary to opioid therapy for CHRONIC pain or ACUTE pain?

## 2023-09-14 DIAGNOSIS — F1121 Opioid dependence, in remission: Secondary | ICD-10-CM | POA: Diagnosis not present

## 2023-09-14 DIAGNOSIS — D2339 Other benign neoplasm of skin of other parts of face: Secondary | ICD-10-CM | POA: Diagnosis not present

## 2023-09-14 DIAGNOSIS — L570 Actinic keratosis: Secondary | ICD-10-CM | POA: Diagnosis not present

## 2023-09-14 NOTE — Telephone Encounter (Signed)
WOW Great work! Thank You  Did You let the patient know! Thanks again

## 2023-09-15 NOTE — Telephone Encounter (Signed)
Patient picked up script yesterday per pharmacy.

## 2023-09-16 ENCOUNTER — Telehealth (HOSPITAL_COMMUNITY): Payer: Self-pay | Admitting: Family Medicine

## 2023-09-16 ENCOUNTER — Encounter: Payer: Self-pay | Admitting: Family Medicine

## 2023-09-16 ENCOUNTER — Encounter (HOSPITAL_COMMUNITY): Payer: Self-pay

## 2023-09-16 ENCOUNTER — Encounter (HOSPITAL_COMMUNITY): Payer: Medicare Other

## 2023-09-16 ENCOUNTER — Ambulatory Visit (INDEPENDENT_AMBULATORY_CARE_PROVIDER_SITE_OTHER): Payer: Medicare Other | Admitting: Family Medicine

## 2023-09-16 ENCOUNTER — Ambulatory Visit
Admission: RE | Admit: 2023-09-16 | Discharge: 2023-09-16 | Disposition: A | Discharge status: Home / Self Care | Payer: Medicare Other | Source: Ambulatory Visit | Attending: Family Medicine | Admitting: Family Medicine

## 2023-09-16 VITALS — BP 108/70 | HR 60 | Ht 64.0 in | Wt 119.8 lb

## 2023-09-16 DIAGNOSIS — M25512 Pain in left shoulder: Secondary | ICD-10-CM | POA: Diagnosis not present

## 2023-09-16 DIAGNOSIS — M7551 Bursitis of right shoulder: Secondary | ICD-10-CM | POA: Diagnosis not present

## 2023-09-16 DIAGNOSIS — L659 Nonscarring hair loss, unspecified: Secondary | ICD-10-CM | POA: Diagnosis not present

## 2023-09-16 DIAGNOSIS — M25511 Pain in right shoulder: Secondary | ICD-10-CM

## 2023-09-16 DIAGNOSIS — M7581 Other shoulder lesions, right shoulder: Secondary | ICD-10-CM | POA: Diagnosis not present

## 2023-09-16 DIAGNOSIS — S46111A Strain of muscle, fascia and tendon of long head of biceps, right arm, initial encounter: Secondary | ICD-10-CM | POA: Diagnosis not present

## 2023-09-16 NOTE — Telephone Encounter (Signed)
Pt called acute rehab office to cancel day of 11/14 OP MBS due to family matter. Rescheduled OP MBS for Monday 12/02/2. (AHARRIS)

## 2023-09-16 NOTE — Patient Instructions (Signed)
We are checking your thyroid.  The biotin can falsely lower the TSH, so I'm also checking a free T4. You don't really have much in the way of thyroid symptoms. I suspect this is a consequence of your illness/surgery/anesthesia.  There is a condition called telogen effluvium that is common--usually the hair loss occurs about 3 months later. It will resolve on its own. Continue biotin (vs other hair/skin/nail vitamins that also contain biotin)  If you have ongoing hair loss, let us know.  If you develop patches of baldness, you should see your dermatologist.

## 2023-09-16 NOTE — Progress Notes (Signed)
Chief Complaint  Patient presents with   Alopecia    Over the last 2 weeks she has noticed hair loss. Wonders if her thyroid could be off? She said she is not under any stress.     Patient noticed that her hair is falling out over the last 2 weeks.--seeing more hair in the shower, on her brush, notices more shedding on her clothing.  Hasn't noticed any particular thinning yet.  Her daughter and hair dresser have both noticed. She denies any scalp itching, flaking, or bald spots.  Had hospitalization and surgery in July (bowel obstruction)--anesthesia, and change in diet while in hospital and unable to eat.  No changes to energy, moods, nails, skin. No change to bowels--normal for her (alternates constipation and diarrhea). She has been eating more. Currently constipated.  She takes collagen with biotin, for several months.  Lab Results  Component Value Date   TSH 3.660 04/29/2023     PMH, PSH, SH reviewed  Outpatient Encounter Medications as of 09/16/2023  Medication Sig Note   bisacodyl (DULCOLAX) 5 MG EC tablet Take 1 tablet (5 mg total) by mouth daily as needed for moderate constipation. (Patient taking differently: Take 5 mg by mouth as needed for moderate constipation.) 09/16/2023: Took one last night   Cholecalciferol (VITAMIN D) 50 MCG (2000 UT) CAPS Take 1 each by mouth daily. 09/16/2023: Gummy w/calcium   Collagen-Vitamin C-Biotin (COLLAGEN PO) Take 30 mLs by mouth daily. 09/16/2023: With biotin (liquid from [employer])   cyanocobalamin (VITAMIN B12) 1000 MCG tablet Take 1,000 mcg by mouth daily. 09/16/2023: gummy   cycloSPORINE (RESTASIS) 0.05 % ophthalmic emulsion Place 1 drop into both eyes in the morning, at noon, in the evening, and at bedtime.    docusate sodium (COLACE) 100 MG capsule Take 2 capsules (200 mg total) by mouth 2 (two) times daily for 8 days then as directed by MD 09/16/2023: Takes 2 nightly   famotidine (PEPCID) 20 MG tablet Take 1 tablet (20 mg total) by  mouth at bedtime.    gabapentin (NEURONTIN) 600 MG tablet Take 600-1,200 mg by mouth See admin instructions. Take 600 mg by mouth in the morning and 1200 mg at night 09/16/2023: Takes 1qm and 2qpm   ibuprofen (ADVIL) 800 MG tablet TAKE 1 TABLET(800 MG) BY MOUTH EVERY 8 HOURS AS NEEDED 09/16/2023: Took one this am   lamoTRIgine (LAMICTAL) 100 MG tablet Take 100 mg by mouth 2 (two) times daily.     MAGNESIUM CITRATE PO Take 250 mg by mouth daily. 09/16/2023: gummy   Multiple Vitamins-Minerals (ADULT GUMMY PO) Take 1 each by mouth at bedtime. Vitamin B12 and Vitamin D gummy    naloxegol oxalate (MOVANTIK) 25 MG TABS tablet Take 1 tablet (25 mg total) by mouth daily.    polyethylene glycol (MIRALAX) 17 g packet Take 17 g by mouth daily.    simvastatin (ZOCOR) 20 MG tablet TAKE 1 TABLET(20 MG) BY MOUTH DAILY    traZODone (DESYREL) 50 MG tablet Take 50 mg by mouth at bedtime.    Turmeric-Ginger 150-25 MG CHEW Chew 1 each by mouth daily. 09/16/2023: Gummy    vitamin C (ASCORBIC ACID) 500 MG tablet Take 500 mg by mouth every evening.    zinc gluconate 50 MG tablet Take 50 mg by mouth every evening.    [DISCONTINUED] VEVYE 0.1 % SOLN Apply 1 drop to eye 2 (two) times daily.    acetaminophen (TYLENOL) 500 MG tablet Take 1,000 mg by mouth every 6 (six) hours  as needed for moderate pain. (Patient not taking: Reported on 09/16/2023) 09/16/2023: As needed   bisacodyl (DULCOLAX) 10 MG suppository Unwrap and place 1 suppository (10 mg total) rectally daily as needed for moderate constipation. (Patient not taking: Reported on 09/16/2023) 09/16/2023: As needed   diphenoxylate-atropine (LOMOTIL) 2.5-0.025 MG tablet Take 1 tablet by mouth 4 (four) times daily as needed for diarrhea or loose stools. (Patient not taking: Reported on 09/01/2023) 09/16/2023: As needed   hydrocortisone 2.5 % cream Apply 1 Application topically 2 (two) times daily as needed (hemorrhoids). (Patient not taking: Reported on 09/16/2023)  09/16/2023: As needed   ondansetron (ZOFRAN-ODT) 4 MG disintegrating tablet Take 1 tablet (4 mg total) by mouth every 6 (six) hours as needed for nausea. (Patient not taking: Reported on 09/16/2023) 09/16/2023: As needed   simethicone (MYLICON) 80 MG chewable tablet Chew 1 tablet (80 mg total) by mouth every 6 (six) hours as needed for flatulence. (Patient not taking: Reported on 09/16/2023) 09/16/2023: As needed   valACYclovir (VALTREX) 1000 MG tablet Take by mouth as needed. (Patient not taking: Reported on 09/16/2023) 09/16/2023: As needed   [DISCONTINUED] DULoxetine (CYMBALTA) 30 MG capsule Take 1-2 capsules (30-60 mg total) by mouth daily. If tolerating 1 cap (30mg ) daily  for 3 weeks, can increase to 2 caps daily (Patient not taking: Reported on 09/01/2023)    [DISCONTINUED] Lotilaner (XDEMVY) 0.25 % SOLN Place 1 drop into both eyes in the morning and at bedtime. (Patient not taking: Reported on 09/01/2023)    [DISCONTINUED] pantoprazole (PROTONIX) 40 MG tablet Take 1 tablet (40 mg total) by mouth daily. (Patient not taking: Reported on 09/01/2023)    No facility-administered encounter medications on file as of 09/16/2023.   Allergies  Allergen Reactions   Buprenorphine Hcl Nausea And Vomiting   Morphine And Codeine Nausea Only    ROS:  Denies any focal alopecia. No f/c, n/v.  Alternates constipation and diarrhea, currently constipated. Moods are good. No changes to skin/nails. +hair loss per HPI.   PHYSICAL EXAM:  BP 108/70   Pulse 60   Ht 5\' 4"  (1.626 m)   Wt 119 lb 12.8 oz (54.3 kg)   BMI 20.56 kg/m   Wt Readings from Last 3 Encounters:  09/16/23 119 lb 12.8 oz (54.3 kg)  09/07/23 116 lb (52.6 kg)  09/03/23 116 lb (52.6 kg)   Well-appearing, thin female, in no distress Bandage on L cheek (visit to dermatologist--didn't mention hair loss to them) Scalp--Limited exam of hair, pulled back.  No erythema, flaking or other scalp abnormality.  No visualized in  entirety. Neck: no lymphadenopathy or thyromegaly Heart: regular rate and rhythm Lungs: clear Psych: normal mood, affect, hygiene and grooming Neuro: alert and oriented, cranial nerves grossly intact, normal gait.   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 Will check thyroid--fT4 and not only TSH, given use of biotin. Reassured - Plan: TSH, T4, Free  TSH, Free T4

## 2023-09-17 ENCOUNTER — Other Ambulatory Visit (HOSPITAL_COMMUNITY): Payer: Self-pay | Admitting: Otolaryngology

## 2023-09-17 DIAGNOSIS — R1310 Dysphagia, unspecified: Secondary | ICD-10-CM

## 2023-09-17 LAB — T4, FREE: Free T4: 1.08 ng/dL (ref 0.82–1.77)

## 2023-09-17 LAB — TSH: TSH: 1.96 u[IU]/mL (ref 0.450–4.500)

## 2023-09-21 ENCOUNTER — Encounter: Payer: Self-pay | Admitting: Family Medicine

## 2023-09-22 ENCOUNTER — Other Ambulatory Visit: Payer: Self-pay

## 2023-09-22 ENCOUNTER — Encounter: Payer: Self-pay | Admitting: Family Medicine

## 2023-09-22 DIAGNOSIS — M25511 Pain in right shoulder: Secondary | ICD-10-CM

## 2023-09-26 ENCOUNTER — Other Ambulatory Visit: Payer: Medicare Other

## 2023-10-03 DIAGNOSIS — G4733 Obstructive sleep apnea (adult) (pediatric): Secondary | ICD-10-CM | POA: Diagnosis not present

## 2023-10-04 ENCOUNTER — Ambulatory Visit (HOSPITAL_COMMUNITY)
Admission: RE | Admit: 2023-10-04 | Discharge: 2023-10-04 | Disposition: A | Discharge status: Home / Self Care | Payer: Medicare Other | Source: Ambulatory Visit | Attending: Family Medicine | Admitting: Family Medicine

## 2023-10-04 ENCOUNTER — Ambulatory Visit (HOSPITAL_COMMUNITY)
Admission: RE | Admit: 2023-10-04 | Discharge: 2023-10-04 | Disposition: A | Discharge status: Home / Self Care | Payer: Medicare Other | Source: Ambulatory Visit | Attending: Family Medicine

## 2023-10-04 DIAGNOSIS — R1310 Dysphagia, unspecified: Secondary | ICD-10-CM

## 2023-10-04 DIAGNOSIS — R1312 Dysphagia, oropharyngeal phase: Secondary | ICD-10-CM | POA: Insufficient documentation

## 2023-10-04 NOTE — Evaluation (Signed)
Modified Barium Swallow Study  Patient Details  Name: Bethany [name] MRN: [id number] Date of Birth: 1956-07-28  Today's Date: 10/04/2023  Modified Barium Swallow completed.  Full report located under Chart Review in the Imaging Section.  History of Present Illness Pt is a 67 yo referred for OP MBS by ENT, who is evaluating her for dysphonia and throat clearing. Flexible laryngoscopy 09/01/23 suggestive of MTD and mild thinning of the VFs. Pt also reports feeling like food/pills get "stuck" in her throat but denies coughing during meals. PMH includes: smoking, former EtOH,     PMH includes:   Clinical Impression Pt has a mild pharyngeal dysphagia with prominent CP, reduced epiglottic inversion, and pharyngeal stripping wave that contribute to residue in the valleculae and pyriform sinuses across all consistencies, increasing with thicker/more solid textures. The barium tablet was also transiently lodged in the valleculae but cleared with a liquid wash. Liquid washes and spontaneous additional swallow help to reduce pharyngeal residuals.  Upon esophageal scan, there was barium remaining in the esophagus as well. Airway protection was intact throughout the study. Recommend continuing regular solids and thin liquids as tolerated with use of esophageal precautions. SLP also provided education reinforcing use of swallowing strategies that she seems to be starting to use already. Given pharyngeal deficits noted, as well as pt reports of dysphonia, would consider OP SLP for follow up. May also want to consider more dedicated assessment of the esophagus.  Factors that may increase risk of adverse event in presence of aspiration Rubye Oaks & Clearance Coots 2021): Respiratory or GI disease  Swallow Evaluation Recommendations Recommendations: PO diet PO Diet Recommendation: Regular;Thin liquids (Level 0) Liquid Administration via: Cup;Straw Medication Administration: Whole meds with liquid Supervision: Patient able  to self-feed Swallowing strategies  : Multiple dry swallows after each bite/sip;Follow solids with liquids Postural changes: Position pt fully upright for meals;Stay upright 30-60 min after meals Oral care recommendations: Oral care BID (2x/day) Recommended consults: Consider esophageal assessment      Mahala Menghini., M.A. CCC-SLP Acute Rehabilitation Services Office [phone]  Secure chat preferred  10/04/2023,1:35 PM

## 2023-10-05 ENCOUNTER — Encounter: Payer: Medicare Other | Attending: Physical Medicine & Rehabilitation | Admitting: Physical Medicine & Rehabilitation

## 2023-10-05 ENCOUNTER — Encounter: Payer: Self-pay | Admitting: Physical Medicine & Rehabilitation

## 2023-10-05 VITALS — BP 146/83 | HR 73 | Ht 64.0 in | Wt 117.0 lb

## 2023-10-05 DIAGNOSIS — G8929 Other chronic pain: Secondary | ICD-10-CM | POA: Insufficient documentation

## 2023-10-05 DIAGNOSIS — M25561 Pain in right knee: Secondary | ICD-10-CM | POA: Diagnosis not present

## 2023-10-05 DIAGNOSIS — F419 Anxiety disorder, unspecified: Secondary | ICD-10-CM | POA: Insufficient documentation

## 2023-10-05 DIAGNOSIS — M545 Low back pain, unspecified: Secondary | ICD-10-CM | POA: Insufficient documentation

## 2023-10-05 MED ORDER — TIZANIDINE HCL 2 MG PO CAPS: 2.0000 mg | ORAL_CAPSULE | Freq: Three times a day (TID) | ORAL | 2 refills | Status: AC | PRN

## 2023-10-05 MED ORDER — ALPRAZOLAM 0.5 MG PO TABS: 0.5000 mg | ORAL_TABLET | Freq: Two times a day (BID) | ORAL | 0 refills | Status: DC

## 2023-10-05 NOTE — Progress Notes (Signed)
Subjective:    Patient ID: Bethany [name], female    DOB: 09-Aug-1956, 67 y.o.   MRN: [id number]  HPI  Bethany [name] is a 67 y.o. 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 (08/02/2020), Arthritis of carpometacarpal [facility]) joint of left thumb (01/18/2020), Cervical dysplasia (1990), Degenerative disc disease, lumbar (05/02/2020), Generalized anxiety disorder, Herpes genitalis, Herpes labialis, History of calcium pyrophosphate deposition disease (CPPD) (03/26/2020), History of opiate abuse, in remission, History of small bowel obstruction (04/17/2018), Hyperlipidemia with target LDL less than 100 (06/17/2011), IBS (irritable bowel syndrome), Lumbago with sciatica, right side (02/10/2021), Major depressive disorder, Meibomian gland dysfunction (MGD) of both eyes (08/02/2020), Narcotic bowel syndrome (HCC), Osteopenia (04/2014), Raynaud's disease (06/17/2011), Recovering alcoholic in remission , S/P LASIK (laser assisted in situ keratomileusis) (08/02/2020), Spondylolisthesis of lumbar region (02/10/2021), Status post total right knee replacement (10/31/2019), and Tubular adenoma of colon (04/21/2016).   They are presenting to PM&R clinic as a new patient for pain management evaluation. They were referred by Dr. Katrinka Blazing for treatment of chronic bilateral lower back pain with right-sided sciatica pain.  Patient reports that she has had chronic pain.  She reports her pain is in her back, history of L4/5 TLIF 2022.  She continues to have pain in her lower back since this time.  Pain is constant she has pain when she stands for a long time.  Worsened by sitting for significant period of time.  She has not had any TKA with continued pain since this time.  She had a bowel obstruction immediately after this surgery that limited her postop therapy until this resolved.  She has had multiple  further issues with bowel obstructions since this time.  She reports she developed scar tissue very quickly and this happened in the knee.  Patient reports she is is very active.  She also plays pickle ball regularly.  She does regular stretching and other exercises she learned with PT previously.  She reports last PT was about a year ago.  She does a lot of walking.  She reports that she does not limit the pain. She continues to be the activities she wants to do. Swelling of joints wounds.  Numbness  She has a hx of IBS, ADHD, Depression and anxiety.     Red flag symptoms: No red flags for back pain endorsed in Hx or ROS  Medications tried: Topical medications- Voltaren gel- didn't help  Nsaids Ibprofen- takes daily Prior use of celebrex and meloxicam Tylenol - helps a little  Opiates-prior history etoh and opiod addiction Gabapentin- Taking for years for anxiety- takes for years  TCAs - does not recall SNRIs  - Denies use  Robaxin and other muscle relaxers- didn't help much   Other treatments: PT/OT - Did not help too much, tried dry needling  TENs unit -Denies use  Injections- R shoulder bursitis- feels great  Surgery - TKR- intestinal blockage after the surgery, limited PT immediately after the surgery  L4/5 TLIF 2022 Prior UDS results: No results found for: "LABOPIA", "COCAINSCRNUR", "LABBENZ", "AMPHETMU", "THCU", "LABBARB"   Interval history 10/05/2023 Patient is here for follow-up regarding her chronic pain.  She reports she had very good short-term results with nerve block of her right knee.  She is scheduled for RFA with Dr. Wynn Banker later this month.  She has a lot of anxiety about the procedure, preprocedural Xanax has helped  her tolerate procedures better in the past.  She continues to have primarily axial back pain not well-controlled with current medications.  She was unable to tolerate duloxetine, reported did not make her feel good psychologically.   Pain  Inventory Average Pain 4 Pain Right Now 2 My pain is dull, stabbing, and aching  In the last 24 hours, has pain interfered with the following? General activity 1 Relation with others 0 Enjoyment of life 2 What TIME of day is your pain at its worst? daytime Sleep (in general) Good  Pain is worse with: walking, inactivity, and standing Pain improves with: rest, heat/ice, and therapy/exercise Relief from Meds: 4     Family History  Problem Relation Age of Onset   Hypertension Mother    Dementia Mother        rapid decline; concerns for Creutzfeldt-Jakob disease   Lymphoma Father 51   Breast cancer Sister 62   Hypertension Daughter    Colon cancer Neg Hx    Social History   Socioeconomic History   Marital status: Married    Spouse name: Not on file   Number of children: Not on file   Years of education: 16   Highest education level: Bachelor's degree (e.g., BA, AB, BS)  Occupational History   Occupation: Retired    Comment: Librarian, academic  Tobacco Use   Smoking status: Former    Current packs/day: 0.00    Average packs/day: 1 pack/day for 40.0 years (40.0 ttl pk-yrs)    Types: Cigarettes    Start date: 03/21/1966    Quit date: 03/21/2006    Years since quitting: 17.5   Smokeless tobacco: Never   Tobacco comments:    Uses 10 nicorette lozenge daily  Vaping Use   Vaping status: Never Used  Substance and Sexual Activity   Alcohol use: Not Currently    Comment: history of alcohol abuse/dependence. Sober for past 20 years   Drug use: Not Currently    Types: Oxycodone    Comment: history of opiate abuse. Sober for 1.5 years (after back surgery)   Sexual activity: Yes    Birth control/protection: Surgical    Comment: HYST-1st intercourse 67 yo-More than 5 partners  Other Topics Concern   Not on file  Social History Narrative   Married. 1 dog (golden doodle puppy)   2 daughters, 1 grandson, 1 granddaughter (local).   1 daughter lives here, one in [place].    Retired Librarian, academic      Reviewed 04/2023   Social Determinants of Health   Financial Resource Strain: Not on file  Food Insecurity: No Food Insecurity (05/25/2023)   Hunger Vital Sign    Worried About Running Out of Food in the Last Year: Never true    Ran Out of Food in the Last Year: Never true  Transportation Needs: No Transportation Needs (05/25/2023)   PRAPARE - Administrator, Civil Service (Medical): No    Lack of Transportation (Non-Medical): No  Physical Activity: Not on file  Stress: Not on file  Social Connections: Not on file   Past Surgical History:  Procedure Laterality Date   AUGMENTATION MAMMAPLASTY     BREAST ENHANCEMENT SURGERY  2006   CATARACT EXTRACTION Bilateral    Jan/Feb 2024   COSMETIC SURGERY     EYE SURGERY     lasik od   GYNECOLOGIC CRYOSURGERY  1990   LAPAROTOMY N/A 04/22/2018   Procedure: EXPLORATORY LAPAROTOMY;  Surgeon: Harriette Bouillon, MD;  Location:  [facility] OR;  Service: General;  Laterality: N/A;   LAPAROTOMY N/A 05/14/2023   Procedure: EXPLORATORY LAPAROTOMY;  Surgeon: Harriette Bouillon, MD;  Location: [facility] OR;  Service: General;  Laterality: N/A;   LIGAMENT REPAIR Right 09/24/2015   Procedure: RIGHT INDEX METACARPAL PHALANGEAL RADIAL COLLATERAL LIGAMENT REPAIR;  Surgeon: Betha Loa, MD;  Location: [facility];  Service: Orthopedics;  Laterality: Right;   LYSIS OF ADHESION N/A 05/14/2023   Procedure: LYSIS OF ADHESION, REPAIR OF SMALL BOWEL SEROSAL TEAR x 3 WITH FULL-THICKNESS REPAIR x 1;  Surgeon: Harriette Bouillon, MD;  Location: [facility] OR;  Service: General;  Laterality: N/A;   MASS EXCISION N/A 08/04/2018   Procedure: REMOVAL OF SUTURE FROM ABDOMEN/ERAS PATHWAY;  Surgeon: Harriette Bouillon, MD;  Location:  SURGERY CENTER;  Service: General;  Laterality: N/A;   OOPHORECTOMY     BSO   Right knee arthoscopy Right 09/09/2017   [facility]   SHX 1 Left 02/19/2020   basel cell carcinoma   SHX1 Right 12/11/2020    basal cell carinoma,micronodular pattern   SKIN BIOPSY Right 02/28/2019   Superficial basal carcinoma    SKIN BIOPSY Left 11/24/2021   superficial basal carcinoma of the left forearm   TONSILLECTOMY AND ADENOIDECTOMY     TOTAL KNEE ARTHROPLASTY Right 04/12/2018   TOTAL KNEE ARTHROPLASTY Right 04/12/2018   Procedure: RIGHT TOTAL KNEE ARTHROPLASTY;  Surgeon: Marcene Corning, MD;  Location: [facility];  Service: Orthopedics;  Laterality: Right;   TRANSFORAMINAL LUMBAR INTERBODY FUSION W/ MIS 1 LEVEL N/A 09/03/2021   Procedure: Lumbar four-five Minimally invasive transforaminal lumbar interbody fusion;  Surgeon: Dawley, Alan Mulder, DO;  Location: [facility];  Service: Neurosurgery;  Laterality: N/A;   TUBAL LIGATION     UPPER GASTROINTESTINAL ENDOSCOPY  04/24/2005   VAGINAL HYSTERECTOMY  2005   TVH BSO  adenomyosis   Past Medical History:  Diagnosis Date   ADHD (attention deficit hyperactivity disorder) 09/13/2018   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 All Paps normal afterwards   Degenerative disc disease, lumbar 05/02/2020   Generalized anxiety disorder    Herpes genitalis    Herpes labialis    History of calcium pyrophosphate deposition disease (CPPD) 03/26/2020   History of opiate abuse, in remission    History of small bowel obstruction 04/17/2018   partial   Hyperlipidemia with target LDL less than 100 06/17/2011   IBS (irritable bowel syndrome)    Lumbago with sciatica, right side 02/10/2021   Major depressive disorder    Meibomian gland dysfunction (MGD) of both eyes 08/02/2020   Narcotic bowel syndrome (HCC)    Osteopenia 04/2014   T score -1.7 FRAX 7.3%/0.8% stable from prior DEXA 2013   Raynaud's disease 06/17/2011   Recovering alcoholic in remission     S/P LASIK (laser assisted in situ  keratomileusis) 08/02/2020   right eye   Spondylolisthesis of lumbar region 02/10/2021   Status post total right knee replacement 10/31/2019   Tubular adenoma of colon 04/21/2016   There were no vitals taken for this visit.  Opioid Risk Score:   Fall Risk Score:  `1  Depression screen [facility] 2/9     08/03/2023   10:38 AM 06/02/2023    9:35 AM 04/28/2023    1:33 PM 04/23/2022    1:53 PM 12/11/2021  2:52 PM 02/21/2021    3:24 PM 10/31/2019    9:57 AM  Depression screen PHQ 2/9  Decreased Interest 0 3 0 0 1 0 0  Down, Depressed, Hopeless 0 3 0 0 1 0 0  PHQ - 2 Score 0 6 0 0 2 0 0  Altered sleeping 0 3 0 0 3    Tired, decreased energy 0 3 2 0 1    Change in appetite 0 3 0 0 1    Feeling bad or failure about yourself  0 3 0 0 1    Trouble concentrating 1 3 1  0 1    Moving slowly or fidgety/restless 0 0 0 0 1    Suicidal thoughts 0 0 0 0 0    PHQ-9 Score 1 21 3  0 10    Difficult doing work/chores  Very difficult Not difficult at all Not difficult at all Somewhat difficult       Review of Systems  Constitutional: Negative.   HENT: Negative.    Eyes: Negative.   Respiratory: Negative.    Cardiovascular: Negative.   Gastrointestinal: Negative.   Endocrine: Negative.   Genitourinary: Negative.   Musculoskeletal:  Positive for back pain and gait problem.       Right knee pain  Skin: Negative.   Allergic/Immunologic: Negative.   Hematological:  Does not bruise/bleed easily.  Psychiatric/Behavioral:  Negative for dysphoric mood. The patient is not nervous/anxious.   All other systems reviewed and are negative.      Objective:   Physical Exam   Gen: no distress, normal appearing HEENT: oral mucosa pink and moist, NCAT Chest: normal effort, normal rate of breathing Abd: soft, non-distended Ext: no edema Psych: pleasant, normal affect Skin: intact Neuro: alert and awake, follows commands, CN 2-12 grossly intact  RUE: 5/5 Deltoid, 5/5 Biceps, 5/5 Triceps, 5/5 Wrist Ext, 5/5  Grip LUE: 5/5 Deltoid, 5/5 Biceps, 5/5 Triceps, 5/5 Wrist Ext, 5/5 Grip RLE: HF 5/5, KE 5/5, KF 5/5, ADF 5/5, APF 5/5 LLE: HF 5/5, KE 5/5, HF, 5/5, ADF 5/5, APF 5/5 Sensory exam normal for light touch and pain in all 4 limbs.   No abnormal tone appreciated.  No ankle clonus Musculoskeletal:  L spine paraspinal tenderness R knee -mild tenderness to palpation Mild pain with R knee varus and valgus stress Slump test- negative Facet loading mildly positive today     10/30/20 MRI L spine IMPRESSION: 1. Degenerative anterolisthesis at L4-L5 with moderate to severe spinal canal stenosis, narrowing of the bilateral subarticular zones, moderate right and mild left neural foraminal narrowing. 2. Mild right neural foraminal narrowing at L3-4.   Assessment & Plan:   1)Chronic Knee pain s/p R TKA 2)Chronic lower back pain, s/p L4/5 TLIF 2022 -Pain is primarily axial  3) history of anxiety/depression  -Consider repeat imaging, MRI if pain does not improve, consider interventional options -Start Cymbalta 30mg  daily for pain- she reports she did not tolerate it from emotional perspective -Foods for pain, Tumaric and ginger etc -Ordered Zynex Nexwave prior visit- she has device but has not tried yet -Referred to Dr. Wynn Banker for consider of Genicular nerve block- good results, she is schedule for RFA-will order 2 tabs of Xanax 0.5 mg for before and after procedure anxiety -She would like to avoid opoid medications  -Will order tizanidine 2mg  TID PRN -Patient does have evidence of facet joint disease lower lumbar spine on prior imaging studies, hx surgical fusion L4-5.   Could consider MBB at levels  above and below this potentially.

## 2023-10-11 DIAGNOSIS — L570 Actinic keratosis: Secondary | ICD-10-CM | POA: Diagnosis not present

## 2023-10-11 DIAGNOSIS — L218 Other seborrheic dermatitis: Secondary | ICD-10-CM | POA: Diagnosis not present

## 2023-10-11 DIAGNOSIS — Z85828 Personal history of other malignant neoplasm of skin: Secondary | ICD-10-CM | POA: Diagnosis not present

## 2023-10-12 ENCOUNTER — Telehealth: Payer: Self-pay | Admitting: Gastroenterology

## 2023-10-12 ENCOUNTER — Encounter: Payer: Self-pay | Admitting: *Deleted

## 2023-10-12 DIAGNOSIS — S46011A Strain of muscle(s) and tendon(s) of the rotator cuff of right shoulder, initial encounter: Secondary | ICD-10-CM | POA: Diagnosis not present

## 2023-10-12 NOTE — Telephone Encounter (Signed)
Patient called requested to speak with a nurse said her bowel movements are not normal and she would like to discuss further.

## 2023-10-12 NOTE — Telephone Encounter (Signed)
Reason for call/chief complaint: Passing a lot of gas when she has bowel movement. Having daily passage of stool. Increased abdominal bloating. No medication changes. These symptoms for a bout a month. Interested in finding cause.  Advise/Recommendations  Breath test?

## 2023-10-12 NOTE — Telephone Encounter (Signed)
She is susceptible for small intestinal bacterial overgrowth and IBS diarrhea with history of intra-abdominal surgeries and partial small bowel obstructions.  We can empirically treat with rifaximin 550 mg 3 times daily for 10 days, please send prescription.  Follow-up in office visit next available appointment with me.  Thanks

## 2023-10-12 NOTE — Progress Notes (Unsigned)
No chief complaint on file.  Patient presents for follow-up on her use of CPAP for sleep apnea. She had sleep study in 06/2023, showing: IMPRESSIONS - Severe obstructive sleep apnea occurred during this study (AHI = 53.6/h). - Oxygen desaturation was noted during this study (Min O2 = 75%, Mean 94%). - Patient snored.    Recently had swallow study (10/2023), referred by ENT (referred there for hoarseness). Clinical Impression: Clinical Impression: Pt has a mild pharyngeal dysphagia with prominent CP, reduced epiglottic inversion, and pharyngeal stripping wave that contribute to residue in the valleculae and pyriform sinuses across all consistencies, increasing with thicker/more solid textures. The barium tablet was also transiently lodged in the valleculae but cleared with a liquid wash. Liquid washes and spontaneous additional swallow help to reduce pharyngeal residuals.  Upon esophageal scan, there was barium remaining in the esophagus as well. Airway protection was intact throughout the study. Recommend continuing regular solids and thin liquids as tolerated with use of esophageal precautions. SLP also provided education reinforcing use of swallowing strategies that she seems to be starting to use already. Given pharyngeal deficits noted, as well as pt reports of dysphonia, would consider OP SLP for follow up. May also want to consider more dedicated assessment of the esophagus.  She is seeing SLP.   She reached out to GI yesterday with complaints of 1 month of passing more gas when she has bowel movement and increased abdominal bloating. They are treating her presumptively for SIBO with rifaximin course.   PMH, PSH, SH reviewed   ROS:    PHYSICAL EXAM:  There were no vitals taken for this visit.      ASSESSMENT/PLAN:  Need compliance report

## 2023-10-13 ENCOUNTER — Ambulatory Visit (INDEPENDENT_AMBULATORY_CARE_PROVIDER_SITE_OTHER): Payer: Medicare Other | Admitting: Family Medicine

## 2023-10-13 ENCOUNTER — Encounter: Payer: Self-pay | Admitting: Family Medicine

## 2023-10-13 ENCOUNTER — Other Ambulatory Visit: Payer: Self-pay

## 2023-10-13 VITALS — BP 130/80 | HR 60 | Ht 64.0 in | Wt 115.8 lb

## 2023-10-13 DIAGNOSIS — H6123 Impacted cerumen, bilateral: Secondary | ICD-10-CM

## 2023-10-13 DIAGNOSIS — G4733 Obstructive sleep apnea (adult) (pediatric): Secondary | ICD-10-CM

## 2023-10-13 MED ORDER — RIFAXIMIN 550 MG PO TABS: 550.0000 mg | ORAL_TABLET | Freq: Three times a day (TID) | ORAL | 0 refills | Status: AC

## 2023-10-13 NOTE — Telephone Encounter (Signed)
Patient agrees with this plan. Rx to [facility]. Aware that insurance may require an authorization.

## 2023-10-13 NOTE — Progress Notes (Unsigned)
No chief complaint on file.  Patient was seen yesterday for a different reason, mentioned feeling plugging in both ears, R>L, with decreased hearing. She was noted to have cerumen bilaterally (with possible cotton swab portion on right). She had to get to another appointment yesterday, wasn't able to stay for lavage of the ear. She returns today to get her ears cleaned.    PMH, PSH, SH reviewed   ROS: She denies fever, chills, URI symptoms, cough, shortness of breath or other concerns.    PHYSICAL EXAM:  There were no vitals taken for this visit.      ASSESSMENT/PLAN:

## 2023-10-14 ENCOUNTER — Encounter: Payer: Self-pay | Admitting: Family Medicine

## 2023-10-14 ENCOUNTER — Ambulatory Visit (INDEPENDENT_AMBULATORY_CARE_PROVIDER_SITE_OTHER): Payer: Federal, State, Local not specified - PPO | Admitting: Family Medicine

## 2023-10-14 VITALS — BP 128/70 | HR 68 | Ht 64.0 in | Wt 115.0 lb

## 2023-10-14 DIAGNOSIS — H6123 Impacted cerumen, bilateral: Secondary | ICD-10-CM | POA: Diagnosis not present

## 2023-10-19 ENCOUNTER — Encounter (HOSPITAL_BASED_OUTPATIENT_CLINIC_OR_DEPARTMENT_OTHER): Payer: Medicare Other | Admitting: Physical Medicine & Rehabilitation

## 2023-10-19 ENCOUNTER — Encounter: Payer: Self-pay | Admitting: Physical Medicine & Rehabilitation

## 2023-10-19 VITALS — BP 118/79 | HR 70 | Ht 64.0 in | Wt 114.6 lb

## 2023-10-19 DIAGNOSIS — M25561 Pain in right knee: Secondary | ICD-10-CM | POA: Diagnosis not present

## 2023-10-19 DIAGNOSIS — G8929 Other chronic pain: Secondary | ICD-10-CM

## 2023-10-19 DIAGNOSIS — M545 Low back pain, unspecified: Secondary | ICD-10-CM | POA: Diagnosis not present

## 2023-10-19 DIAGNOSIS — F419 Anxiety disorder, unspecified: Secondary | ICD-10-CM | POA: Diagnosis not present

## 2023-10-19 MED ORDER — LIDOCAINE HCL 1 % IJ SOLN
10.0000 mL | Freq: Once | INTRAMUSCULAR | Status: AC
Start: 2023-10-19 — End: 2023-10-19
  Administered 2023-10-19: 10 mL

## 2023-10-19 MED ORDER — LIDOCAINE HCL (PF) 2 % IJ SOLN
5.0000 mL | Freq: Once | INTRAMUSCULAR | Status: AC
Start: 2023-10-19 — End: 2023-10-19
  Administered 2023-10-19: 5 mL

## 2023-10-19 NOTE — Progress Notes (Signed)
RIGHT Genicular nerve Radiofrequency neurotomy x 3 nerves, Upper medial, Upper lateral , and Lower Medial under fluoroscopic guidance  Indication Chronic post operative pain in the Knee, pain postop total knee replacement which has not responded to conservative management such as physical therapy and medication management  Informed consent was obtained after describing risks and to the procedure to the patient these include bleeding bruising and infection, patient elects to proceed and has given written consent. Patient placed supine on the fluoroscopy table AP images of the knee joint were obtained. A 25-gauge 1.5 inch needle was used to anesthetize the skin and subcutaneous tissue with 1% lidocaine, 1.5 cc at each of 3 locations. Then a 18-gauge RF needle with 1cm curved active tip was inserted targeting the junction of the medial flare of the tibia with the shaft of the tibia, bone contact made and confirmed with lateral imaging.  Motor stimulation demonstrated no muscle twitch.  Injected of 1.50ml .5% bupivacaine. Then the junction of the medial epicondyles of the femur with the femoral shaft was targeted needle was advanced under fluoroscopic guidance until bone contact. Appropriate depth was obtained and confirmed with lateral images.Motor stimulation demonstrated no muscle twitch,  followed by injection of 1.21ml of .5% bupivacaine. Then the junction of the lateral femoral condyle with the femoral shaft was targeted. 22-gauge 3.5 inch needle was advanced under fluoroscopic guidance until bone contact. Appropriate depth was confirmed with lateral imaging.Motor stimulation demonstrated no muscle twitch, followed by injection of 1.5 cc of .5% bupivacaine solution.Then Standard RF was used 80 degrees x 90 seconds, Patient tolerated procedure well. Post procedure instructions given

## 2023-10-19 NOTE — Progress Notes (Signed)
  PROCEDURE RECORD [facility]   Name: ELAYNE TOLSMA DOB:Apr 14, 1956 MRN: [id number]  Date:10/19/2023  Physician: Claudette Laws, MD    Nurse/CMA: Hilari Wethington RN  Allergies:  Allergies  Allergen Reactions   Buprenorphine Hcl Nausea And Vomiting   Morphine And Codeine Nausea Only    Consent Signed: Yes.    Is patient diabetic? No.  CBG today?   Pregnant: No. LMP: No LMP recorded. Patient has had a hysterectomy. (age [age])  Anticoagulants: no Anti-inflammatory: no Antibiotics: yes (Xifan for 10 days, started 3 days ago)  Procedure: right knee genicular RF  Position: Supine Start Time: 1:12 End Time: 1:28  Fluoro Time: 107  RN/CMA Designer, multimedia    Time 12:42 1:38    BP 118/79 130/65    Pulse 70 67    Respirations 14 14    O2 Sat 98 98    S/S 6 6    Pain Level 3 0     D/C home with husband, patient A & O X 3, D/C instructions reviewed, and sits independently.

## 2023-10-19 NOTE — Patient Instructions (Signed)
You had a radio frequency procedure today This was done to alleviate joint pain in yourRight knee area We injected lidocaine and marcaine which are local anesthetics.  You may experience soreness at the injection sites. You may also experienced some irritation of the nerves that were heated I'm recommending ice for 30 minutes every 2 hours as needed for the next 24-48 hours

## 2023-10-20 ENCOUNTER — Encounter (INDEPENDENT_AMBULATORY_CARE_PROVIDER_SITE_OTHER): Payer: Self-pay

## 2023-10-20 ENCOUNTER — Ambulatory Visit (INDEPENDENT_AMBULATORY_CARE_PROVIDER_SITE_OTHER): Payer: Medicare Other | Admitting: Otolaryngology

## 2023-10-20 VITALS — Ht 65.0 in | Wt 114.0 lb

## 2023-10-20 DIAGNOSIS — H903 Sensorineural hearing loss, bilateral: Secondary | ICD-10-CM

## 2023-10-20 DIAGNOSIS — K224 Dyskinesia of esophagus: Secondary | ICD-10-CM

## 2023-10-20 DIAGNOSIS — R1310 Dysphagia, unspecified: Secondary | ICD-10-CM | POA: Diagnosis not present

## 2023-10-20 DIAGNOSIS — R490 Dysphonia: Secondary | ICD-10-CM | POA: Diagnosis not present

## 2023-10-20 DIAGNOSIS — G4733 Obstructive sleep apnea (adult) (pediatric): Secondary | ICD-10-CM

## 2023-10-20 DIAGNOSIS — R0989 Other specified symptoms and signs involving the circulatory and respiratory systems: Secondary | ICD-10-CM

## 2023-10-20 DIAGNOSIS — R1312 Dysphagia, oropharyngeal phase: Secondary | ICD-10-CM

## 2023-10-20 NOTE — Progress Notes (Signed)
Dear Dr. Lynelle Doctor, Here is my assessment for our mutual patient, Bethany [name]. Thank you for allowing me the opportunity to care for your patient. Please do not hesitate to contact me should you have any other questions. Sincerely, Dr. Jovita Kussmaul  Otolaryngology Clinic Note Referring provider: Dr. Lynelle Doctor HPI:  Bethany [name] is a 67 y.o. female kindly referred by Dr. Lynelle Doctor for evaluation of hoarseness  Initial visit (09/01/2023): Saw Dr. Lynelle Doctor 06/2023 for hoarseness and throat clearing. Reports ongoing for past year, worsening - voice worse with voice use and end of the day. Never goes out completely. Some "tightness in her throat" - but not lump in her throat. Query dysphagia as intermittent food sticking and sometimes thicker liquids - slowly getting worse. Doesn't feel like there is PND, no CRS symptoms. Throat clearing - no triggers such as perfumes, temp/pressure, no tickle but worst when throat is dry. Drinks lots of coffee - 4 cups/day; water - 20 oz. Throat does feel dry Otherwise denies aspiration episodes, PNA, odynophagia, hemoptysis, [name] pain, no neck masses, no SOB.  Query some stressors including relate to her health recently with surgery. No otologic symptoms ----------------------------------------------- We decided to treat her for LPR and further workup her dysphagia with MBS and decided to observe SNHL. She returns today after MBS 10/20/2023: Sx are about the same, did have swallow test. Modest CP bar, dysmotility on independent review. Hearing stable, no PNA. Otherwise no symptoms or weight loss. She did have a sleep study and trying CPAP but not helpful. Sleep study was at home and was interested in learning about inspire. We also discussed SLP Rx for swallow and voice (mostly hoarse in AM) and for throat clearing which is about the same but she declined.  ------------------------------------------ Former smoker - 30 pack year history, quit 17 years ago (uses  nicotine) Current ENT meds use: Zyrtec daily for AR; Protonix for GERD (not anymore) and sees Dr. Lavon Paganini (GI) - multiple GI problems including SBO  H&N Surgery: Tonsillectomy and adenoidectomy (Peds) Personal or FHx of bleeding dz or anesthesia difficulty: no  PMHx: prior smoker, OSA, SBO, ADHD, IBS, Back and Knee, Mood disorder Independent Review of Additional Tests or Records:  PCP Notes reviewed (06/2023): summarized above; L cerumen impaction - told to use debrox Audio 11/30/2022 rev independently: decreased hearing for many years and tinnitus (b/l). High pitch, otherwise no symptoms Otoscopy showed a clear view of the tympanic membranes, bilaterally Tympanometry results were consistent with normal middle [name] pressure and normal tympanic membrane mobility (Type A), bilaterally.  Audiometric testing was completed using Conventional Audiometry techniques with insert earphones and TDH headphones. Test results are consistent with normal hearing sensitivity sloping to a moderate sensorineural hearing loss, bilaterally. Speech Recognition Thresholds were obtained at 20 dB HL in the right [name] and at 20  dB HL in the left [name]. Word Recognition Testing was completed at 70 dB HL and Bethany [name]. Asymmetric word recognition noted, worse in the right [name].       MRI 2022 independently reviewed: (no significant sinus disease noted), mastoids well aerated; no obvious large retrocochlear lesions noted   She had sleep study in 06/2023, reviewed and uploaded or available in chart showing: IMPRESSIONS - Severe obstructive sleep apnea occurred during this study (AHI = 53.6/h). - Oxygen desaturation was noted during this study (Min O2 = 75%, Mean 94%).  MBS 10/04/2023: independently reviewed showing modest CP bar prominence but does  have esophageal dysmotility. In addition, some vallecular and pyriform residue but no aspiration. 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 All Paps normal afterwards   Degenerative disc disease, lumbar 05/02/2020   Generalized anxiety disorder    Herpes genitalis    Herpes labialis    History of calcium pyrophosphate deposition disease (CPPD) 03/26/2020   History of opiate abuse, in remission    History of small bowel obstruction 04/17/2018   partial   Hyperlipidemia with target LDL less than 100 06/17/2011   IBS (irritable bowel syndrome)    Lumbago with sciatica, right side 02/10/2021   Major depressive disorder    Meibomian gland dysfunction (MGD) of both eyes 08/02/2020   Narcotic bowel syndrome (HCC)    Osteopenia 04/2014   T score -1.7 FRAX 7.3%/0.8% stable from prior DEXA 2013   Raynaud's disease 06/17/2011   Recovering alcoholic in remission     S/P LASIK (laser assisted in situ keratomileusis) 08/02/2020   right eye   Spondylolisthesis of lumbar region 02/10/2021   Status post total right knee replacement 10/31/2019   Tubular adenoma of colon 04/21/2016     Past Surgical History:  Procedure Laterality Date   AUGMENTATION MAMMAPLASTY     BREAST ENHANCEMENT SURGERY  2006   CATARACT EXTRACTION Bilateral    Jan/Feb 2024   COSMETIC SURGERY     EYE SURGERY     lasik od   GYNECOLOGIC CRYOSURGERY  1990   LAPAROTOMY N/A 04/22/2018   Procedure: EXPLORATORY LAPAROTOMY;  Surgeon: Harriette Bouillon, MD;  Location: [facility];  Service: General;  Laterality: N/A;   LAPAROTOMY N/A 05/14/2023   Procedure: EXPLORATORY LAPAROTOMY;  Surgeon: Harriette Bouillon, MD;  Location: [facility];  Service: General;  Laterality: N/A;   LIGAMENT REPAIR Right 09/24/2015   Procedure:  RIGHT INDEX METACARPAL PHALANGEAL RADIAL COLLATERAL LIGAMENT REPAIR;  Surgeon: Betha Loa, MD;  Location: [facility];  Service: Orthopedics;  Laterality: Right;   LYSIS OF ADHESION N/A 05/14/2023   Procedure: LYSIS OF ADHESION, REPAIR OF SMALL BOWEL SEROSAL TEAR x 3 WITH FULL-THICKNESS REPAIR x 1;  Surgeon: Harriette Bouillon, MD;  Location: [facility];  Service: General;  Laterality: N/A;   MASS EXCISION N/A 08/04/2018   Procedure: REMOVAL OF SUTURE FROM ABDOMEN/ERAS PATHWAY;  Surgeon: Harriette Bouillon, MD;  Location: [facility];  Service: General;  Laterality: N/A;   OOPHORECTOMY     BSO   Right knee arthoscopy Right 09/09/2017   [facility]   SHX 1 Left 02/19/2020   basel cell carcinoma   SHX1 Right 12/11/2020   basal cell carinoma,micronodular pattern   SKIN BIOPSY Right 02/28/2019   Superficial basal carcinoma    SKIN BIOPSY Left 11/24/2021   superficial basal carcinoma of the left forearm   TONSILLECTOMY AND ADENOIDECTOMY     TOTAL KNEE ARTHROPLASTY Right 04/12/2018   TOTAL KNEE ARTHROPLASTY Right 04/12/2018   Procedure: RIGHT TOTAL KNEE ARTHROPLASTY;  Surgeon: Marcene Corning, MD;  Location: [facility];  Service: Orthopedics;  Laterality: Right;   TRANSFORAMINAL LUMBAR INTERBODY FUSION W/ MIS 1 LEVEL N/A 09/03/2021   Procedure: Lumbar four-five Minimally invasive transforaminal lumbar interbody fusion;  Surgeon: Dawley,  Alan Mulder, DO;  Location: [facility];  Service: Neurosurgery;  Laterality: N/A;   TUBAL LIGATION     UPPER GASTROINTESTINAL ENDOSCOPY  04/24/2005   VAGINAL HYSTERECTOMY  2005   TVH BSO  adenomyosis    Family History  Problem Relation Age of Onset   Hypertension Mother    Dementia Mother        rapid decline; concerns for Creutzfeldt-Jakob disease   Lymphoma Father 3   Breast cancer Sister 77   Hypertension Daughter    Colon cancer Neg Hx      Social Connections: Socially Integrated (10/12/2023)   Social Connection and Isolation Panel  [NHANES]    Frequency of Communication with Friends and Family: More than three times a week    Frequency of Social Gatherings with Friends and Family: More than three times a week    Attends Religious Services: More than 4 times per year    Active Member of [employer] or Organizations: Yes    Attends Banker Meetings: 1 to 4 times per year    Marital Status: Married       Current Outpatient Medications:    acetaminophen (TYLENOL) 500 MG tablet, Take 1,000 mg by mouth every 6 (six) hours as needed for moderate pain (pain score 4-6)., Disp: , Rfl:    ALPRAZolam (XANAX) 0.5 MG tablet, Take 1 tablet (0.5 mg total) by mouth 2 (two) times daily., Disp: 2 tablet, Rfl: 0   bisacodyl (DULCOLAX) 10 MG suppository, Unwrap and place 1 suppository (10 mg total) rectally daily as needed for moderate constipation., Disp: 20 suppository, Rfl: 0   bisacodyl (DULCOLAX) 5 MG EC tablet, Take 1 tablet (5 mg total) by mouth daily as needed for moderate constipation., Disp: 15 tablet, Rfl: 0   Cholecalciferol (VITAMIN D) 50 MCG (2000 UT) CAPS, Take 1 each by mouth daily., Disp: , Rfl:    Collagen-Vitamin C-Biotin (COLLAGEN PO), Take 30 mLs by mouth daily., Disp: , Rfl:    cyanocobalamin (VITAMIN B12) 1000 MCG tablet, Take 1,000 mcg by mouth daily., Disp: , Rfl:    cycloSPORINE (RESTASIS) 0.05 % ophthalmic emulsion, Place 1 drop into both eyes in the morning, at noon, in the evening, and at bedtime., Disp: , Rfl:    diphenoxylate-atropine (LOMOTIL) 2.5-0.025 MG tablet, Take 1 tablet by mouth 4 (four) times daily as needed for diarrhea or loose stools., Disp: 90 tablet, Rfl: 0   docusate sodium (COLACE) 100 MG capsule, Take 2 capsules (200 mg total) by mouth 2 (two) times daily for 8 days then as directed by MD, Disp: 100 capsule, Rfl: 0   famotidine (PEPCID) 20 MG tablet, Take 1 tablet (20 mg total) by mouth at bedtime., Disp: 30 tablet, Rfl: 2   gabapentin (NEURONTIN) 600 MG tablet, Take 600-1,200 mg by  mouth See admin instructions. Take 600 mg by mouth in the morning and 1200 mg at night, Disp: , Rfl:    hydrocortisone 2.5 % cream, Apply 1 Application topically 2 (two) times daily as needed (hemorrhoids)., Disp: , Rfl:    ibuprofen (ADVIL) 800 MG tablet, TAKE 1 TABLET(800 MG) BY MOUTH EVERY 8 HOURS AS NEEDED, Disp: 90 tablet, Rfl: 0   lamoTRIgine (LAMICTAL) 100 MG tablet, Take 100 mg by mouth 2 (two) times daily. , Disp: , Rfl: 0   MAGNESIUM CITRATE PO, Take 250 mg by mouth daily., Disp: , Rfl:    Multiple Vitamins-Minerals (ADULT GUMMY PO), Take 1 each by mouth at bedtime. Vitamin B12 and Vitamin  D gummy, Disp: , Rfl:    naloxegol oxalate (MOVANTIK) 25 MG TABS tablet, Take 1 tablet (25 mg total) by mouth daily., Disp: 30 tablet, Rfl: 1   ondansetron (ZOFRAN-ODT) 4 MG disintegrating tablet, Take 1 tablet (4 mg total) by mouth every 6 (six) hours as needed for nausea., Disp: 20 tablet, Rfl: 0   polyethylene glycol (MIRALAX) 17 g packet, Take 17 g by mouth daily., Disp: 14 each, Rfl: 0   rifaximin (XIFAXAN) 550 MG TABS tablet, Take 1 tablet (550 mg total) by mouth 3 (three) times daily for 10 days., Disp: 30 tablet, Rfl: 0   simethicone (MYLICON) 80 MG chewable tablet, Chew 1 tablet (80 mg total) by mouth every 6 (six) hours as needed for flatulence., Disp: 100 tablet, Rfl: 0   simvastatin (ZOCOR) 20 MG tablet, TAKE 1 TABLET(20 MG) BY MOUTH DAILY, Disp: 90 tablet, Rfl: 3   tizanidine (ZANAFLEX) 2 MG capsule, Take 1 capsule (2 mg total) by mouth 3 (three) times daily as needed for muscle spasms., Disp: 90 capsule, Rfl: 2   traZODone (DESYREL) 50 MG tablet, Take 50 mg by mouth at bedtime., Disp: , Rfl:    Turmeric-Ginger 150-25 MG CHEW, Chew 1 each by mouth daily., Disp: , Rfl:    valACYclovir (VALTREX) 1000 MG tablet, Take by mouth as needed., Disp: , Rfl:    vitamin C (ASCORBIC ACID) 500 MG tablet, Take 500 mg by mouth every evening., Disp: , Rfl:    zinc gluconate 50 MG tablet, Take 50 mg by mouth  every evening., Disp: , Rfl:    Physical Exam:   Ht 5\' 5"  (1.651 m)   Wt 114 lb (51.7 kg)   BMI 18.97 kg/m    Salient findings:  CN II-XII intact  Bilateral EAC clear with modest non-obstructing cerumen and TM intact with well pneumatized middle [name] spaces Anterior rhinoscopy: Septum relatively midline; bilateral inferior turbinates without significant hypertrophy No lesions of oral cavity/oropharynx; dentition intact No obviously palpable neck masses/lymphadenopathy/thyromegaly No respiratory distress or stridor; voice quality class II, no breaks, vocal fry  Procedures:  Procedure Note TFL (prior, not today) Findings: The nasal cavity and nasopharynx did not reveal any masses or lesions, mucosa appeared to be without obvious lesions. The tongue base, pharyngeal walls, piriform sinuses, vallecula, epiglottis and postcricoid region are normal in appearance. The visualized portion of the subglottis and proximal trachea is widely patent. The vocal folds are mobile bilaterally. There are no lesions on the free edge of the vocal folds nor elsewhere in the larynx worrisome for malignancy.  AP compression suggestive of MTD; mild thinning of VF    Electronically signed by: Read Drivers, MD 10/20/2023 8:59 AM   Impression & Plans:  Nakishia Lacsamana is a 67 y.o. female with prior history of smoking and multiple GI problems with: Dysphonia Throat clearing - mostly when throat is dry  - most likely due to component of laryngeal hygiene and some mild VF atrophy and MTD. Query if she has LPR given some temporary benefit with PPI. We discussed management strategies including improving laryngeal hydration, throat clearing strategies (sipping water), and brief voice use techniques. We tried pepcid and reflux gourmet but did not help. Do not think LPR big component at this point and we discussed voice therapy - She wished to forego this and wishes to observe - Will d/c Pepcid 20mg  QHS  Dysphagia -  sensation of food sticking in throat and some worsening general swallowing with solids and liquids -  MBS 2024: Modest CP bar and some residue but no aspiration; she also has evidence of dysmotility - candidate for swallow rx but declined; we also briefly discussed dilation but I suspect that dysmotility is a bigger problem - She already follows Dr. Lavon Paganini with upcoming appt so will route note to her and patient will see her/discuss with her in f/u  4. B/l Symmetric SNHL - not a problem currently; will follow up as needed for this  5. OSA - using mask but interested in inspire; would like her to have formal sleep study and establish with sleep/pulm before anything - Will refer to sleep - If candidate for inspire after pulm/sleep eval, happy to see her back for DISE and Inspire   Thank you for allowing me the opportunity to care for your patient. Please do not hesitate to contact me should you have any other questions.  Sincerely, Jovita Kussmaul, MD Otolarynoglogist (ENT), [facility] Phone: [phone] Fax: [phone]  10/20/2023, 8:59 AM   MDM:  Level 4 - 99214 Complexity/Problems addressed: mod -  multiple stable chronic problems Data complexity: independent interpretation of tests (sleep, swallow) and notes - Morbidity: mod - Prescription Drug prescribed or managed: yes - d/c pepcid

## 2023-10-20 NOTE — Patient Instructions (Signed)
Reflux gourmet - reflux medication; try after meals. Buy on [address]

## 2023-10-20 NOTE — Progress Notes (Unsigned)
[name] [address] [place] [address] Phone: [phone] Subjective:   Bruce Donath, am serving as a scribe for Dr. Antoine Primas.  I'm seeing this patient by the request  of:  Joselyn Arrow, MD  CC: Bilateral shoulder, neck and back pain  [id number]  Subrena Kobrin Gillenwater is a 67 y.o. female coming in with complaint of back and neck pain. OMT 09/07/2023. Also f/u for B shoulder pain. Patient states that she is the same as last visit. Pain is dependant upon movement.   Medications patient has been prescribed: None  Taking:         Reviewed prior external information including notes and imaging from previsou exam, outside providers and external EMR if available.   As well as notes that were available from care everywhere and other healthcare systems.  Past medical history, social, surgical and family history all reviewed in electronic medical record.  No pertanent information unless stated regarding to the chief complaint.   Past Medical History:  Diagnosis Date   ADHD (attention deficit hyperactivity disorder) 09/13/2018   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 08/02/2020   Arthritis of carpometacarpal [facility]) joint of left thumb 01/18/2020   Injected January 18, 2020   Cervical dysplasia 1990   CIN 2 subsequent cryosurgery. All Paps normal afterwards   Degenerative disc disease, lumbar 05/02/2020   Generalized anxiety disorder    Herpes genitalis    Herpes labialis    History of calcium pyrophosphate deposition disease (CPPD) 03/26/2020   History of opiate abuse, in remission    History of small bowel obstruction 04/17/2018   partial   Hyperlipidemia with target LDL less than 100 06/17/2011   IBS (irritable bowel syndrome)    Lumbago with sciatica, right side 02/10/2021   Major depressive disorder    Meibomian gland dysfunction (MGD) of both eyes 08/02/2020   Narcotic bowel syndrome (HCC)    Osteopenia  04/2014   T score -1.7 FRAX 7.3%/0.8% stable from prior DEXA 2013   Raynaud's disease 06/17/2011   Recovering alcoholic in remission     S/P LASIK (laser assisted in situ keratomileusis) 08/02/2020   right eye   Spondylolisthesis of lumbar region 02/10/2021   Status post total right knee replacement 10/31/2019   Tubular adenoma of colon 04/21/2016    Allergies  Allergen Reactions   Buprenorphine Hcl Nausea And Vomiting   Morphine And Codeine Nausea Only     Review of Systems:  No headache, visual changes, nausea, vomiting, diarrhea, constipation, dizziness, abdominal pain, skin rash, fevers, chills, night sweats, weight loss, swollen lymph nodes, body aches, joint swelling, chest pain, shortness of breath, mood changes. POSITIVE muscle aches  Objective  Blood pressure 112/78, height 5\' 5"  (1.651 m), weight 116 lb (52.6 kg).   General: No apparent distress alert and oriented x3 mood and affect normal, dressed appropriately.  HEENT: Pupils equal, extraocular movements intact  Respiratory: Patient's speak in full sentences and does not appear short of breath  Cardiovascular: No lower extremity edema, non tender, no erythema  Severe loss of lordosis noted.  Tenderness to palpation of the paraspinal musculature.  Patient does have some weakness to hip abductors bilaterally.  Patient does have tightness even in the sacroiliac joint.  Fairly severe.  Osteopathic findings  C2 flexed rotated and side bent right C6 flexed rotated and side bent left T3 extended rotated and side bent right inhaled rib T9 extended rotated and side bent left L2  flexed rotated and side bent right L5 flexed rotated and side bent left Sacrum right on right       Assessment and Plan:  Spondylolisthesis of lumbar region Significant arthritic changes that is status post laboratory.  Will continue to monitor.  Does have small bowel obstruction history due to which does make medications difficult.  Does  respond fairly well though to osteopathic manipulation.  Continue to be active otherwise.  Is going to undergo shoulder surgery in the near future.  Patient should recover well from this help.  Follow-up with me again in 6 to 8 weeks status postsurgery.  Severe sleep apnea Patient does have severe sleep apnea.  Will send to ENT but patient is seen previously and see if they would further evaluate the scanned documents from August 2024 to see if she would qualify for the inspire surgery with patient having significant difficulty with the CPAP machine.    Nonallopathic problems  Decision today to treat with OMT was based on Physical Exam  After verbal consent patient was treated with HVLA, ME, FPR techniques in cervical, rib, thoracic, lumbar, and sacral  areas  Patient tolerated the procedure well with improvement in symptoms  Patient given exercises, stretches and lifestyle modifications  See medications in patient instructions if given  Patient will follow up in 4-8 weeks     The above documentation has been reviewed and is accurate and complete Judi Saa, DO         Note: This dictation was prepared with Dragon dictation along with smaller phrase technology. Any transcriptional errors that result from this process are unintentional.

## 2023-10-21 ENCOUNTER — Encounter: Payer: Self-pay | Admitting: Family Medicine

## 2023-10-21 ENCOUNTER — Ambulatory Visit: Payer: Medicare Other | Admitting: Family Medicine

## 2023-10-21 VITALS — BP 112/78 | Ht 65.0 in | Wt 116.0 lb

## 2023-10-21 DIAGNOSIS — M9904 Segmental and somatic dysfunction of sacral region: Secondary | ICD-10-CM

## 2023-10-21 DIAGNOSIS — M9903 Segmental and somatic dysfunction of lumbar region: Secondary | ICD-10-CM | POA: Diagnosis not present

## 2023-10-21 DIAGNOSIS — G4730 Sleep apnea, unspecified: Secondary | ICD-10-CM | POA: Diagnosis not present

## 2023-10-21 DIAGNOSIS — M9902 Segmental and somatic dysfunction of thoracic region: Secondary | ICD-10-CM

## 2023-10-21 DIAGNOSIS — M4316 Spondylolisthesis, lumbar region: Secondary | ICD-10-CM

## 2023-10-21 DIAGNOSIS — M9908 Segmental and somatic dysfunction of rib cage: Secondary | ICD-10-CM

## 2023-10-21 DIAGNOSIS — M9901 Segmental and somatic dysfunction of cervical region: Secondary | ICD-10-CM

## 2023-10-21 NOTE — Assessment & Plan Note (Signed)
Significant arthritic changes that is status post laboratory.  Will continue to monitor.  Does have small bowel obstruction history due to which does make medications difficult.  Does respond fairly well though to osteopathic manipulation.  Continue to be active otherwise.  Is going to undergo shoulder surgery in the near future.  Patient should recover well from this help.  Follow-up with me again in 6 to 8 weeks status postsurgery.

## 2023-10-21 NOTE — Patient Instructions (Signed)
Good to see you I will check sleep study and will touch base with Dr. Allena Katz See me 8 weeks after your surgery

## 2023-10-21 NOTE — Assessment & Plan Note (Signed)
Patient does have severe sleep apnea.  Will send to ENT but patient is seen previously and see if they would further evaluate the scanned documents from August 2024 to see if she would qualify for the inspire surgery with patient having significant difficulty with the CPAP machine.

## 2023-10-22 ENCOUNTER — Encounter (INDEPENDENT_AMBULATORY_CARE_PROVIDER_SITE_OTHER): Payer: Self-pay

## 2023-10-22 ENCOUNTER — Telehealth (INDEPENDENT_AMBULATORY_CARE_PROVIDER_SITE_OTHER): Payer: Self-pay

## 2023-10-22 NOTE — Telephone Encounter (Signed)
Called patient back regarding the inspire. The patient wanted to do inspire but before she can have the procedure done I explained to her she has to have the sleep study done. Talk to breanna about referral person she changed the sleep study to [facility]. I told the pt if they have not called back within a week to please give me a call to check up on it.

## 2023-11-03 HISTORY — PX: SHOULDER ARTHROSCOPY: SHX128

## 2023-11-04 DIAGNOSIS — Z1231 Encounter for screening mammogram for malignant neoplasm of breast: Secondary | ICD-10-CM | POA: Diagnosis not present

## 2023-11-04 DIAGNOSIS — F1121 Opioid dependence, in remission: Secondary | ICD-10-CM | POA: Diagnosis not present

## 2023-11-04 LAB — HM MAMMOGRAPHY

## 2023-11-05 ENCOUNTER — Encounter: Payer: Self-pay | Admitting: Family Medicine

## 2023-11-05 DIAGNOSIS — M25561 Pain in right knee: Secondary | ICD-10-CM | POA: Diagnosis not present

## 2023-11-10 DIAGNOSIS — M75121 Complete rotator cuff tear or rupture of right shoulder, not specified as traumatic: Secondary | ICD-10-CM | POA: Diagnosis not present

## 2023-11-10 DIAGNOSIS — Y999 Unspecified external cause status: Secondary | ICD-10-CM | POA: Diagnosis not present

## 2023-11-10 DIAGNOSIS — X58XXXA Exposure to other specified factors, initial encounter: Secondary | ICD-10-CM | POA: Diagnosis not present

## 2023-11-10 DIAGNOSIS — G8918 Other acute postprocedural pain: Secondary | ICD-10-CM | POA: Diagnosis not present

## 2023-11-10 DIAGNOSIS — S46111A Strain of muscle, fascia and tendon of long head of biceps, right arm, initial encounter: Secondary | ICD-10-CM | POA: Diagnosis not present

## 2023-11-10 DIAGNOSIS — S43431A Superior glenoid labrum lesion of right shoulder, initial encounter: Secondary | ICD-10-CM | POA: Diagnosis not present

## 2023-11-10 DIAGNOSIS — S46011A Strain of muscle(s) and tendon(s) of the rotator cuff of right shoulder, initial encounter: Secondary | ICD-10-CM | POA: Diagnosis not present

## 2023-11-10 DIAGNOSIS — M7521 Bicipital tendinitis, right shoulder: Secondary | ICD-10-CM | POA: Diagnosis not present

## 2023-11-10 DIAGNOSIS — M24111 Other articular cartilage disorders, right shoulder: Secondary | ICD-10-CM | POA: Diagnosis not present

## 2023-11-10 DIAGNOSIS — M25811 Other specified joint disorders, right shoulder: Secondary | ICD-10-CM | POA: Diagnosis not present

## 2023-11-10 DIAGNOSIS — M19011 Primary osteoarthritis, right shoulder: Secondary | ICD-10-CM | POA: Diagnosis not present

## 2023-11-10 DIAGNOSIS — M7581 Other shoulder lesions, right shoulder: Secondary | ICD-10-CM | POA: Diagnosis not present

## 2023-11-15 DIAGNOSIS — S46011D Strain of muscle(s) and tendon(s) of the rotator cuff of right shoulder, subsequent encounter: Secondary | ICD-10-CM | POA: Diagnosis not present

## 2023-11-16 ENCOUNTER — Ambulatory Visit: Payer: Medicare Other | Admitting: Physical Medicine & Rehabilitation

## 2023-11-19 ENCOUNTER — Other Ambulatory Visit: Payer: Self-pay | Admitting: Gastroenterology

## 2023-11-19 DIAGNOSIS — S46011D Strain of muscle(s) and tendon(s) of the rotator cuff of right shoulder, subsequent encounter: Secondary | ICD-10-CM | POA: Diagnosis not present

## 2023-11-22 DIAGNOSIS — S46011D Strain of muscle(s) and tendon(s) of the rotator cuff of right shoulder, subsequent encounter: Secondary | ICD-10-CM | POA: Diagnosis not present

## 2023-11-24 DIAGNOSIS — F1121 Opioid dependence, in remission: Secondary | ICD-10-CM | POA: Diagnosis not present

## 2023-11-25 DIAGNOSIS — S46011D Strain of muscle(s) and tendon(s) of the rotator cuff of right shoulder, subsequent encounter: Secondary | ICD-10-CM | POA: Diagnosis not present

## 2023-11-29 DIAGNOSIS — G4733 Obstructive sleep apnea (adult) (pediatric): Secondary | ICD-10-CM | POA: Diagnosis not present

## 2023-11-30 DIAGNOSIS — S46011D Strain of muscle(s) and tendon(s) of the rotator cuff of right shoulder, subsequent encounter: Secondary | ICD-10-CM | POA: Diagnosis not present

## 2023-12-01 ENCOUNTER — Ambulatory Visit (INDEPENDENT_AMBULATORY_CARE_PROVIDER_SITE_OTHER): Payer: Medicare Other | Admitting: Gastroenterology

## 2023-12-01 ENCOUNTER — Encounter: Payer: Self-pay | Admitting: Gastroenterology

## 2023-12-01 VITALS — BP 104/60 | HR 86 | Ht 65.0 in | Wt 113.0 lb

## 2023-12-01 DIAGNOSIS — K582 Mixed irritable bowel syndrome: Secondary | ICD-10-CM

## 2023-12-01 DIAGNOSIS — K638219 Small intestinal bacterial overgrowth, unspecified: Secondary | ICD-10-CM

## 2023-12-01 DIAGNOSIS — Z8719 Personal history of other diseases of the digestive system: Secondary | ICD-10-CM

## 2023-12-01 NOTE — Progress Notes (Signed)
Bethany [name]    [id number]    1956/06/03  Primary Care Physician:Knapp, Clarene Critchley, MD  Referring Physician: Joselyn Arrow, MD [address] [place],  [place] [address]   Chief complaint:  IBS  Discussed the use of AI scribe software for clinical note transcription with the patient, who gave verbal consent to proceed.  History of Present Illness   68 year old very pleasant female, with a history of multiple bowel obstructions, presents with bowel irregularities and shoulder pain.  She experiences bowel irregularities characterized by a frequency of three to four times a day, which she considers her baseline. There is variability in stool consistency, ranging from loose to hard. Her current management includes Movantik, Dulcolax (5 mg at bedtime), and MiraLAX (half capful twice a day). She has not needed Lomotil recently. With a history of multiple bowel obstructions, she is cautious about her diet, avoiding nuts, seeds, and raw vegetables, and prefers smoothies. Occasional bloating and gas occur, though not as severe as previously experienced.  She is undergoing physical therapy for shoulder pain, which she describes as painful but necessary to prevent a frozen shoulder. The pain originated from a pickleball injury where she felt a 'tear' during a forehand move. She is eager to return to playing pickleball, a sport she used to engage in five times a week, but acknowledges she may not be able to resume until May.  Socially, she is active and enjoys playing pickleball, which she considers a social activity. She has adjusted her diet to manage her bowel condition, avoiding certain foods and opting for smoothies.     GI Hx: DG Abd Acute 2+v w 1v chest 06-08-23 1. Unremarkable bowel gas pattern. 2. No acute intrathoracic process.   DG Abd Portable 1v 05-13-23 1. Overall paucity of small bowel gas limits the ability to assess for obstruction. There is a dilated loop of bowel that  projects over the lower pelvis. 2. Enteric tube with side hole above the level of the GE junction, recommend advancement.   DG Abd Portable 1v 05-12-23 Previously seen small bowel dilatation has resolved.    CT Abdomen Pelvis w contrast 05-10-23 1. Dilated small bowel loop in the left lower quadrant of the abdomen, with air-fluid level in this region. No proximal dilatation of small bowel or stomach otherwise noted. However, the possibility of earlier partial small bowel obstruction should be considered, potentially from adhesions. Surgical consultation is recommended. 2. 2 mm nonobstructive calculus in the lower pole collecting system of the left kidney. 3. Additional postoperative changes and incidental findings, as above.   Colonoscopy 07-01-22 - One 5 mm polyp in the transverse colon, removed with a cold snare. Resected and retrieved.  - External and internal hemorrhoids Surgical [P], colon, transverse, polyp (1) - TUBULAR ADENOMA. - NO HIGH GRADE DYSPLASIA OR MALIGNANCY.   Colonoscopy 04/10/2016: 5 mm sessile polyp, melanosis, internal hemorrhoids   Colonoscopy 07-21-04 Normal    She has multiple episodes of small bowel obstruction secondary to adhesions, was recently hospitalized for SBO which improved with NG tube suction and bowel rest   She was hospitalized after knee replacement surgery in June 2011 with constipation, abdominal pain and vomiting.  CT abd & pelvis showed moderate to high grade partial small bowel obstruction.  She had undergo exlap with lysis with adhesions and resection of distal ileum. Pathology chronic inflammation with adhesions, negative for dysplasia or malignancy.     Outpatient Encounter Medications as of  12/01/2023  Medication Sig   Cholecalciferol (VITAMIN D) 50 MCG (2000 UT) CAPS Take 1 each by mouth daily.   Collagen-Vitamin C-Biotin (COLLAGEN PO) Take 30 mLs by mouth daily.   cyanocobalamin (VITAMIN B12) 1000 MCG tablet Take 1,000 mcg by mouth  daily.   cycloSPORINE (RESTASIS) 0.05 % ophthalmic emulsion Place 1 drop into both eyes in the morning, at noon, in the evening, and at bedtime.   docusate sodium (COLACE) 100 MG capsule Take 2 capsules (200 mg total) by mouth 2 (two) times daily for 8 days then as directed by MD   gabapentin (NEURONTIN) 600 MG tablet Take 600-1,200 mg by mouth See admin instructions. Take 600 mg by mouth in the morning and 1200 mg at night   ibuprofen (ADVIL) 800 MG tablet TAKE 1 TABLET(800 MG) BY MOUTH EVERY 8 HOURS AS NEEDED   lamoTRIgine (LAMICTAL) 100 MG tablet Take 100 mg by mouth 2 (two) times daily.    MAGNESIUM CITRATE PO Take 250 mg by mouth daily.   MOVANTIK 25 MG TABS tablet TAKE 1 TABLET(25 MG) BY MOUTH DAILY   Multiple Vitamins-Minerals (ADULT GUMMY PO) Take 1 each by mouth at bedtime. Vitamin B12 and Vitamin D gummy   polyethylene glycol (MIRALAX) 17 g packet Take 17 g by mouth daily.   simvastatin (ZOCOR) 20 MG tablet TAKE 1 TABLET(20 MG) BY MOUTH DAILY   traZODone (DESYREL) 50 MG tablet Take 50 mg by mouth at bedtime.   Turmeric-Ginger 150-25 MG CHEW Chew 1 each by mouth daily.   vitamin C (ASCORBIC ACID) 500 MG tablet Take 500 mg by mouth every evening.   zinc gluconate 50 MG tablet Take 50 mg by mouth every evening.   acetaminophen (TYLENOL) 500 MG tablet Take 1,000 mg by mouth every 6 (six) hours as needed for moderate pain (pain score 4-6). (Patient not taking: Reported on 12/01/2023)   ALPRAZolam (XANAX) 0.5 MG tablet Take 1 tablet (0.5 mg total) by mouth 2 (two) times daily. (Patient not taking: Reported on 12/01/2023)   bisacodyl (DULCOLAX) 10 MG suppository Unwrap and place 1 suppository (10 mg total) rectally daily as needed for moderate constipation. (Patient not taking: Reported on 12/01/2023)   bisacodyl (DULCOLAX) 5 MG EC tablet Take 1 tablet (5 mg total) by mouth daily as needed for moderate constipation. (Patient not taking: Reported on 12/01/2023)   diphenoxylate-atropine (LOMOTIL)  2.5-0.025 MG tablet Take 1 tablet by mouth 4 (four) times daily as needed for diarrhea or loose stools. (Patient not taking: Reported on 12/01/2023)   famotidine (PEPCID) 20 MG tablet Take 1 tablet (20 mg total) by mouth at bedtime. (Patient not taking: Reported on 12/01/2023)   hydrocortisone 2.5 % cream Apply 1 Application topically 2 (two) times daily as needed (hemorrhoids). (Patient not taking: Reported on 12/01/2023)   ondansetron (ZOFRAN-ODT) 4 MG disintegrating tablet Take 1 tablet (4 mg total) by mouth every 6 (six) hours as needed for nausea. (Patient not taking: Reported on 12/01/2023)   simethicone (MYLICON) 80 MG chewable tablet Chew 1 tablet (80 mg total) by mouth every 6 (six) hours as needed for flatulence. (Patient not taking: Reported on 12/01/2023)   tizanidine (ZANAFLEX) 2 MG capsule Take 1 capsule (2 mg total) by mouth 3 (three) times daily as needed for muscle spasms. (Patient not taking: Reported on 12/01/2023)   valACYclovir (VALTREX) 1000 MG tablet Take by mouth as needed. (Patient not taking: Reported on 12/01/2023)   No facility-administered encounter medications on file as of 12/01/2023.  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 All Paps normal afterwards   Degenerative disc disease, lumbar 05/02/2020   Generalized anxiety disorder    Herpes genitalis    Herpes labialis    History of calcium pyrophosphate deposition disease (CPPD) 03/26/2020   History of opiate abuse, in remission    History of small bowel  obstruction 04/17/2018   partial   Hyperlipidemia with target LDL less than 100 06/17/2011   IBS (irritable bowel syndrome)    Lumbago with sciatica, right side 02/10/2021   Major depressive disorder    Meibomian gland dysfunction (MGD) of both eyes 08/02/2020   Narcotic bowel syndrome (HCC)    Osteopenia 04/2014   T score -1.7 FRAX 7.3%/0.8% stable from prior DEXA 2013   Raynaud's disease 06/17/2011   Recovering alcoholic in remission     S/P LASIK (laser assisted in situ keratomileusis) 08/02/2020   right eye   Spondylolisthesis of lumbar region 02/10/2021   Status post total right knee replacement 10/31/2019   Tubular adenoma of colon 04/21/2016    Past Surgical History:  Procedure Laterality Date   AUGMENTATION MAMMAPLASTY     BREAST ENHANCEMENT SURGERY  2006   CATARACT EXTRACTION Bilateral    Jan/Feb 2024   COSMETIC SURGERY     EYE SURGERY     lasik od   GYNECOLOGIC CRYOSURGERY  1990   LAPAROTOMY N/A 04/22/2018   Procedure: EXPLORATORY LAPAROTOMY;  Surgeon: Harriette Bouillon, MD;  Location: [facility] OR;  Service: General;  Laterality: N/A;   LAPAROTOMY N/A 05/14/2023   Procedure: EXPLORATORY LAPAROTOMY;  Surgeon: Harriette Bouillon, MD;  Location: [facility] OR;  Service: General;  Laterality: N/A;   LIGAMENT REPAIR Right 09/24/2015   Procedure: RIGHT INDEX METACARPAL PHALANGEAL RADIAL COLLATERAL LIGAMENT REPAIR;  Surgeon: Betha Loa, MD;  Location: [facility];  Service: Orthopedics;  Laterality: Right;   LYSIS OF ADHESION N/A 05/14/2023   Procedure: LYSIS OF ADHESION, REPAIR OF SMALL BOWEL SEROSAL TEAR x 3 WITH FULL-THICKNESS REPAIR x 1;  Surgeon: Harriette Bouillon, MD;  Location: [facility] OR;  Service: General;  Laterality: N/A;   MASS EXCISION N/A 08/04/2018   Procedure: REMOVAL OF SUTURE FROM ABDOMEN/ERAS PATHWAY;  Surgeon: Harriette Bouillon, MD;  Location: [facility];  Service: General;  Laterality: N/A;   OOPHORECTOMY     BSO   Right knee arthoscopy Right  09/09/2017   [facility]   SHX 1 Left 02/19/2020   basel cell carcinoma   SHX1 Right 12/11/2020   basal cell carinoma,micronodular pattern   SKIN BIOPSY Right 02/28/2019   Superficial basal carcinoma    SKIN BIOPSY Left 11/24/2021   superficial basal carcinoma of the left forearm   TONSILLECTOMY AND ADENOIDECTOMY     TOTAL KNEE ARTHROPLASTY Right 04/12/2018   TOTAL KNEE ARTHROPLASTY Right 04/12/2018   Procedure: RIGHT TOTAL KNEE ARTHROPLASTY;  Surgeon: Marcene Corning, MD;  Location: [facility];  Service: Orthopedics;  Laterality: Right;   TRANSFORAMINAL LUMBAR INTERBODY FUSION W/ MIS 1 LEVEL N/A 09/03/2021   Procedure: Lumbar four-five Minimally invasive transforaminal  lumbar interbody fusion;  Surgeon: Dawley, Alan Mulder, DO;  Location: [facility];  Service: Neurosurgery;  Laterality: N/A;   TUBAL LIGATION     UPPER GASTROINTESTINAL ENDOSCOPY  04/24/2005   VAGINAL HYSTERECTOMY  2005   TVH BSO  adenomyosis    Family History  Problem Relation Age of Onset   Hypertension Mother    Dementia Mother        rapid decline; concerns for Creutzfeldt-Jakob disease   Lymphoma Father 34   Breast cancer Sister 27   Hypertension Daughter    Colon cancer Neg Hx     Social History   Socioeconomic History   Marital status: Married    Spouse name: Not on file   Number of children: Not on file   Years of education: 16   Highest education level: Bachelor's degree (e.g., BA, AB, BS)  Occupational History   Occupation: Retired    Comment: Librarian, academic  Tobacco Use   Smoking status: Former    Current packs/day: 0.00    Average packs/day: 1 pack/day for 40.0 years (40.0 ttl pk-yrs)    Types: Cigarettes    Start date: 03/21/1966    Quit date: 03/21/2006    Years since quitting: 17.7   Smokeless tobacco: Never   Tobacco comments:    Uses 10 nicorette lozenge daily  Vaping Use   Vaping status: Never Used  Substance and Sexual Activity   Alcohol use: Not Currently    Comment: history of alcohol  abuse/dependence. Sober for past 20 years   Drug use: Not Currently    Types: Oxycodone    Comment: history of opiate abuse. Sober for 1.5 years (after back surgery)   Sexual activity: Yes    Birth control/protection: Surgical    Comment: HYST-1st intercourse 68 yo-More than 5 partners  Other Topics Concern   Not on file  Social History Narrative   Married. 1 dog (golden doodle puppy)   2 daughters, 1 grandson, 1 granddaughter (local).   1 daughter lives here, one in [place].   Retired Librarian, academic      Reviewed 04/2023   Social Drivers of Health   Financial Resource Strain: Low Risk  (10/12/2023)   Overall Financial Resource Strain (CARDIA)    Difficulty of Paying Living Expenses: Not hard at all  Food Insecurity: No Food Insecurity (10/12/2023)   Hunger Vital Sign    Worried About Running Out of Food in the Last Year: Never true    Ran Out of Food in the Last Year: Never true  Transportation Needs: No Transportation Needs (10/12/2023)   PRAPARE - Administrator, Civil Service (Medical): No    Lack of Transportation (Non-Medical): No  Physical Activity: Sufficiently Active (10/12/2023)   Exercise Vital Sign    Days of Exercise per Week: 5 days    Minutes of Exercise per Session: 70 min  Stress: No Stress Concern Present (10/12/2023)   [employer] of Occupational Health - Occupational Stress Questionnaire    Feeling of Stress : Only a little  Social Connections: Socially Integrated (10/12/2023)   Social Connection and Isolation Panel [NHANES]    Frequency of Communication with Friends and Family: More than three times a week    Frequency of Social Gatherings with Friends and Family: More than three times a week    Attends Religious Services: More than 4 times per year    Active Member of [employer] or Organizations: Yes    Attends Banker Meetings: 1  to 4 times per year    Marital Status: Married  Catering manager Violence: Not At Risk  (05/17/2023)   Humiliation, Afraid, Rape, and Kick questionnaire    Fear of Current or Ex-Partner: No    Emotionally Abused: No    Physically Abused: No    Sexually Abused: No      Review of systems: All other review of systems negative except as mentioned in the HPI.   Physical Exam: Vitals:   12/01/23 1351  BP: 104/60  Pulse: 86   Body mass index is 18.8 kg/m. Gen:      No acute distress HEENT:  sclera anicteric CV: s1s2 rrr, no murmur Lungs: B/l clear. Abd:      soft, non-tender; no palpable masses, no distension Ext:    No edema Neuro: alert and oriented x 3 Psych: normal mood and affect  Data Reviewed:  Reviewed labs, radiology imaging, old records and pertinent past GI work up     Assessment and Plan    Bowel Irregularities, IBS with constipation and diarrhea Reports bowel movements 3-4 times daily with inconsistent stool form, ranging from loose to hard. Currently on Movantik, Dulcolax, and Miralax. Discussed adjusting Miralax dosage to manage stool consistency and avoid constipation or diarrhea due to the risk of bowel obstructions. - Continue Movantik - Adjust Miralax to half capful in the morning and half in the evening, skip if stool is not hard - Consider every other day dosing of Miralax based on stool consistency - Discontinue Dulcolax if Miralax adjustment is effective - Use Lomotil as needed but avoid if not necessary - Monitor for symptoms of small intestinal bacterial overgrowth (SIBO) and consider a course of rifaximin if symptoms worsen  Multiple Bowel Obstructions Higher risk of bowel obstructions due to scar tissue. Managing diet to avoid foods that may cause obstruction and vigilant about symptoms. Discussed the unpredictability of the disease and the importance of dietary modifications to prevent future obstructions. - Continue dietary modifications to avoid nuts, seeds, and raw vegetables - Consider smoothies to maintain nutrient intake -  Monitor for symptoms of bowel obstruction and seek medical attention if symptoms arise  Shoulder Injury Undergoing physical therapy for a shoulder injury sustained while playing pickleball. Experiencing pain but compliant with physical therapy to avoid frozen shoulder. - Continue physical therapy - Avoid activities that may exacerbate the injury, such as playing pickleball until fully recovered  General Health Maintenance Managing health well and compliant with medication regimen. Aware of the need to avoid activities that may exacerbate shoulder injury and taking steps to manage bowel health. - Follow up in six months - Contact if any changes or concerns arise  Follow-up - Schedule follow-up appointment in six months.       The patient was provided an opportunity to ask questions and all were answered. The patient agreed with the plan and demonstrated an understanding of the instructions.  Iona Beard , MD    CC: Joselyn Arrow, MD

## 2023-12-01 NOTE — Patient Instructions (Addendum)
VISIT SUMMARY:  During today's visit, we discussed your bowel irregularities and shoulder pain. We reviewed your current medications and made some adjustments to better manage your symptoms. We also talked about your diet and physical therapy regimen to ensure you are on the right track for recovery and overall health maintenance.  YOUR PLAN:  -BOWEL IRREGULARITIES: Bowel irregularities refer to changes in your normal bowel movement patterns, including frequency and consistency. We have adjusted your Miralax dosage to half a capful in the morning and half in the evening, and you should skip a dose if your stool is not hard. You may also consider taking Miralax every other day based on stool consistency. Continue taking Movantik and use Lomotil as needed, but avoid it if not necessary. Discontinue Dulcolax if the Miralax adjustment is effective. Monitor for symptoms of small intestinal bacterial overgrowth (SIBO) and contact us if symptoms worsen.  -MULTIPLE BOWEL OBSTRUCTIONS: Multiple bowel obstructions occur when the intestines are blocked, often due to scar tissue. You are at higher risk for this condition, so it is important to continue avoiding nuts, seeds, and raw vegetables in your diet. Smoothies are a good alternative to maintain nutrient intake. Be vigilant for symptoms of bowel obstruction and seek medical attention if they arise.  -SHOULDER INJURY: A shoulder injury can cause pain and limit your range of motion. You are currently undergoing physical therapy to prevent a frozen shoulder. Continue with your physical therapy and avoid activities like playing pickleball until you are fully recovered.  -GENERAL HEALTH MAINTENANCE: You are managing your health well and are compliant with your medication regimen. Continue to avoid activities that may worsen your shoulder injury and keep up with your dietary adjustments to manage your bowel health. Follow up in six months or contact us if you have  any changes or concerns.  INSTRUCTIONS:  Please schedule a follow-up appointment in six months. Contact us if you experience any changes or have any concerns before then.  I appreciate the  opportunity to care for you  Thank You   Marsa Aris , MD

## 2023-12-03 ENCOUNTER — Encounter: Payer: Self-pay | Admitting: Gastroenterology

## 2023-12-03 DIAGNOSIS — S46011D Strain of muscle(s) and tendon(s) of the rotator cuff of right shoulder, subsequent encounter: Secondary | ICD-10-CM | POA: Diagnosis not present

## 2023-12-06 DIAGNOSIS — S46011D Strain of muscle(s) and tendon(s) of the rotator cuff of right shoulder, subsequent encounter: Secondary | ICD-10-CM | POA: Diagnosis not present

## 2023-12-06 DIAGNOSIS — M25561 Pain in right knee: Secondary | ICD-10-CM | POA: Diagnosis not present

## 2023-12-09 ENCOUNTER — Telehealth (INDEPENDENT_AMBULATORY_CARE_PROVIDER_SITE_OTHER): Payer: Self-pay | Admitting: Otolaryngology

## 2023-12-09 NOTE — Telephone Encounter (Signed)
 Reviewed sleep test and discussed with inspire rep, her study does qualify. Called her to let her know that and if she's interested in DISE or Insp[employer], can proceed Whitman Meinhardt B Debra Calabretta

## 2023-12-10 ENCOUNTER — Telehealth (INDEPENDENT_AMBULATORY_CARE_PROVIDER_SITE_OTHER): Payer: Self-pay | Admitting: Otolaryngology

## 2023-12-10 DIAGNOSIS — G4733 Obstructive sleep apnea (adult) (pediatric): Secondary | ICD-10-CM

## 2023-12-10 DIAGNOSIS — Z789 Other specified health status: Secondary | ICD-10-CM

## 2023-12-10 DIAGNOSIS — S46011D Strain of muscle(s) and tendon(s) of the rotator cuff of right shoulder, subsequent encounter: Secondary | ICD-10-CM | POA: Diagnosis not present

## 2023-12-10 NOTE — Telephone Encounter (Signed)
 ENT Note: Discussed with patient regarding inspire including R/B/A. Reviewed her sleep study which shows AHI > 50, and no central apneas. BMI 19 She has a formal sleep study with [facility] on 2/19. She is unable to tolerate her CPAP and ha snot been using it since mid-December at least despite trial of multiple masks because she feels claustrophobic/suffocation and ends up taking it off in the night. We discussed process of getting an Inspire which includes DISE first and she'd like to proceed with it. We will post her for that. She was appreciative of the call  Eldora KATHEE Blanch

## 2023-12-13 ENCOUNTER — Other Ambulatory Visit: Payer: Self-pay

## 2023-12-13 ENCOUNTER — Encounter (HOSPITAL_BASED_OUTPATIENT_CLINIC_OR_DEPARTMENT_OTHER): Payer: Self-pay | Admitting: Emergency Medicine

## 2023-12-13 DIAGNOSIS — S46011D Strain of muscle(s) and tendon(s) of the rotator cuff of right shoulder, subsequent encounter: Secondary | ICD-10-CM | POA: Diagnosis not present

## 2023-12-16 DIAGNOSIS — S46011D Strain of muscle(s) and tendon(s) of the rotator cuff of right shoulder, subsequent encounter: Secondary | ICD-10-CM | POA: Diagnosis not present

## 2023-12-20 ENCOUNTER — Encounter (HOSPITAL_BASED_OUTPATIENT_CLINIC_OR_DEPARTMENT_OTHER)
Admission: RE | Disposition: A | Discharge status: Home / Self Care | Payer: Self-pay | Source: Home / Self Care | Attending: Otolaryngology

## 2023-12-20 ENCOUNTER — Encounter (HOSPITAL_BASED_OUTPATIENT_CLINIC_OR_DEPARTMENT_OTHER): Payer: Self-pay | Admitting: Anesthesiology

## 2023-12-20 ENCOUNTER — Other Ambulatory Visit: Payer: Self-pay

## 2023-12-20 ENCOUNTER — Ambulatory Visit (HOSPITAL_BASED_OUTPATIENT_CLINIC_OR_DEPARTMENT_OTHER)
Admission: RE | Admit: 2023-12-20 | Discharge: 2023-12-20 | Disposition: A | Discharge status: Home / Self Care | Payer: Medicare Other | Attending: Otolaryngology | Admitting: Otolaryngology

## 2023-12-20 ENCOUNTER — Ambulatory Visit (HOSPITAL_BASED_OUTPATIENT_CLINIC_OR_DEPARTMENT_OTHER): Payer: Self-pay | Admitting: Anesthesiology

## 2023-12-20 DIAGNOSIS — G4733 Obstructive sleep apnea (adult) (pediatric): Secondary | ICD-10-CM

## 2023-12-20 DIAGNOSIS — F418 Other specified anxiety disorders: Secondary | ICD-10-CM | POA: Diagnosis not present

## 2023-12-20 DIAGNOSIS — S46011D Strain of muscle(s) and tendon(s) of the rotator cuff of right shoulder, subsequent encounter: Secondary | ICD-10-CM | POA: Diagnosis not present

## 2023-12-20 DIAGNOSIS — Z91198 Patient's noncompliance with other medical treatment and regimen for other reason: Secondary | ICD-10-CM

## 2023-12-20 DIAGNOSIS — M1990 Unspecified osteoarthritis, unspecified site: Secondary | ICD-10-CM | POA: Insufficient documentation

## 2023-12-20 DIAGNOSIS — E785 Hyperlipidemia, unspecified: Secondary | ICD-10-CM

## 2023-12-20 DIAGNOSIS — Z87891 Personal history of nicotine dependence: Secondary | ICD-10-CM | POA: Diagnosis not present

## 2023-12-20 DIAGNOSIS — Z789 Other specified health status: Secondary | ICD-10-CM

## 2023-12-20 HISTORY — PX: DRUG INDUCED ENDOSCOPY: SHX6808

## 2023-12-20 HISTORY — DX: Sleep apnea, unspecified: G47.30

## 2023-12-20 SURGERY — DRUG INDUCED SLEEP ENDOSCOPY
Anesthesia: General | Site: Nose

## 2023-12-20 MED ORDER — ACETAMINOPHEN 160 MG/5ML PO SOLN: 325.0000 mg | ORAL | Status: DC | PRN

## 2023-12-20 MED ORDER — DEXMEDETOMIDINE HCL IN NACL 80 MCG/20ML IV SOLN
INTRAVENOUS | Status: DC | PRN
  Administered 2023-12-20: 4 ug via INTRAVENOUS

## 2023-12-20 MED ORDER — PROPOFOL 10 MG/ML IV BOLUS
INTRAVENOUS | Status: AC
  Filled 2023-12-20: qty 20

## 2023-12-20 MED ORDER — MEPERIDINE HCL 25 MG/ML IJ SOLN: 6.2500 mg | INTRAMUSCULAR | Status: DC | PRN

## 2023-12-20 MED ORDER — PROPOFOL 10 MG/ML IV BOLUS
INTRAVENOUS | Status: DC | PRN
  Administered 2023-12-20: 10 mg via INTRAVENOUS

## 2023-12-20 MED ORDER — OXYCODONE HCL 5 MG/5ML PO SOLN: 5.0000 mg | Freq: Once | ORAL | Status: DC | PRN

## 2023-12-20 MED ORDER — LIDOCAINE HCL (CARDIAC) PF 100 MG/5ML IV SOSY
PREFILLED_SYRINGE | INTRAVENOUS | Status: DC | PRN
  Administered 2023-12-20: 40 mg via INTRAVENOUS

## 2023-12-20 MED ORDER — FENTANYL CITRATE (PF) 100 MCG/2ML IJ SOLN: 25.0000 ug | INTRAMUSCULAR | Status: DC | PRN

## 2023-12-20 MED ORDER — ONDANSETRON HCL 4 MG/2ML IJ SOLN: 4.0000 mg | Freq: Once | INTRAMUSCULAR | Status: DC | PRN

## 2023-12-20 MED ORDER — OXYMETAZOLINE HCL 0.05 % NA SOLN
NASAL | Status: DC | PRN
  Administered 2023-12-20: 1

## 2023-12-20 MED ORDER — LACTATED RINGERS IV SOLN: INTRAVENOUS | Status: DC

## 2023-12-20 MED ORDER — PROPOFOL 500 MG/50ML IV EMUL
INTRAVENOUS | Status: DC | PRN
  Administered 2023-12-20: 125 ug/kg/min via INTRAVENOUS

## 2023-12-20 MED ORDER — ONDANSETRON HCL 4 MG/2ML IJ SOLN
INTRAMUSCULAR | Status: DC | PRN
  Administered 2023-12-20: 4 mg via INTRAVENOUS

## 2023-12-20 MED ORDER — OXYCODONE HCL 5 MG PO TABS: 5.0000 mg | ORAL_TABLET | Freq: Once | ORAL | Status: DC | PRN

## 2023-12-20 MED ORDER — ACETAMINOPHEN 325 MG PO TABS: 325.0000 mg | ORAL_TABLET | ORAL | Status: DC | PRN

## 2023-12-20 SURGICAL SUPPLY — 12 items
ANTIFOG SOL W/FOAM PAD STRL (MISCELLANEOUS) ×1 IMPLANT
CANISTER SUCT 1200ML W/VALVE (MISCELLANEOUS) ×1 IMPLANT
GLOVE BIO SURGEON STRL SZ 6.5 (GLOVE) ×1 IMPLANT
KIT CLEAN ENDO (MISCELLANEOUS) ×1 IMPLANT
NDL PRECISIONGLIDE 27X1.5 (NEEDLE) IMPLANT
NEEDLE PRECISIONGLIDE 27X1.5 (NEEDLE) IMPLANT
PATTIES SURGICAL .5 X3 (DISPOSABLE) ×1 IMPLANT
SHEET MEDIUM DRAPE 40X70 STRL (DRAPES) ×1 IMPLANT
SOLUTION ANTFG W/FOAM PAD STRL (MISCELLANEOUS) ×1 IMPLANT
SYR CONTROL 10ML LL (SYRINGE) IMPLANT
TOWEL GREEN STERILE FF (TOWEL DISPOSABLE) ×1 IMPLANT
TUBE CONNECTING 20X1/4 (TUBING) IMPLANT

## 2023-12-20 NOTE — Discharge Instructions (Addendum)
 ENT Contact Info: Otolaryngology [employer] including questions about appointments or questions: [phone] - If after normal business hours (Monday-Friday after 5PM or Weekends/Holidays), please call same number and follow prompts for Patient Access Line. There is a physician on call for urgent matters. For life threatening emergencies, please call 911    Post Anesthesia Home Care Instructions  Activity: Get plenty of rest for the remainder of the day. A responsible individual must stay with you for 24 hours following the procedure.  For the next 24 hours, DO NOT: -Drive a car -Advertising copywriter -Drink alcoholic beverages -Take any medication unless instructed by your physician -Make any legal decisions or sign important papers.  Meals: Start with liquid foods such as gelatin or soup. Progress to regular foods as tolerated. Avoid greasy, spicy, heavy foods. If nausea and/or vomiting occur, drink only clear liquids until the nausea and/or vomiting subsides. Call your physician if vomiting continues.  Special Instructions/Symptoms: Your throat may feel dry or sore from the anesthesia or the breathing tube placed in your throat during surgery. If this causes discomfort, gargle with warm salt water. The discomfort should disappear within 24 hours.

## 2023-12-20 NOTE — Anesthesia Preprocedure Evaluation (Addendum)
 Anesthesia Evaluation  Patient identified by MRN, date of birth, ID band Patient awake    Reviewed: Allergy & Precautions, NPO status , Patient's Chart, lab work & pertinent test results  Airway Mallampati: I  TM Distance: >3 FB Neck ROM: Full    Dental no notable dental hx. (+) Teeth Intact, Dental Advisory Given   Pulmonary shortness of breath, former smoker   Pulmonary exam normal breath sounds clear to auscultation       Cardiovascular negative cardio ROS Normal cardiovascular exam Rhythm:Regular Rate:Normal     Neuro/Psych  PSYCHIATRIC DISORDERS Anxiety Depression       GI/Hepatic negative GI ROS,,,(+)     substance abuse    Endo/Other  negative endocrine ROS    Renal/GU      Musculoskeletal  (+) Arthritis , Osteoarthritis,    Abdominal   Peds  Hematology   Anesthesia Other Findings All: buprenorphine  Reproductive/Obstetrics                             Anesthesia Physical Anesthesia Plan  ASA: 3  Anesthesia Plan: General   Post-op Pain Management: Minimal or no pain anticipated   Induction: Intravenous  PONV Risk Score and Plan: 4 or greater and Treatment may vary due to age or medical condition and Propofol infusion  Airway Management Planned: Natural Airway, Simple Face Mask and Mask  Additional Equipment: None  Intra-op Plan:   Post-operative Plan:   Informed Consent: I have reviewed the patients History and Physical, chart, labs and discussed the procedure including the risks, benefits and alternatives for the proposed anesthesia with the patient or authorized representative who has indicated his/her understanding and acceptance.     Dental advisory given  Plan Discussed with: Anesthesiologist and CRNA  Anesthesia Plan Comments:         Anesthesia Quick Evaluation

## 2023-12-20 NOTE — Op Note (Signed)
 Otolaryngology Operative note  NIJA KOOPMAN Date/Time of Admission: 12/20/2023 11:29 AM  CSN: [id number];MRN:[id number]  DOB: 05-20-1956 Age: 68 y.o. Location: [facility]    Pre-Op Diagnosis: SEVERE OBSTRUCTIVE SLEEP APNEA INTOLERANCE OF CONTINUOUS POSITIVE AIRWAY PRESSURE VENTILATION  Post-Op Diagnosis: SEVERE OBSTRUCTIVE SLEEP APNEA INTOLERANCE OF CONTINUOUS POSITIVE AIRWAY PRESSURE VENTILATION  Procedure: Procedure(s): DRUG INDUCED SLEEP ENDOSCOPY USING Sabra Heck- CPT 16109  Surgeon: Jovita Kussmaul, MD  Anesthesia type:  MAC  Anesthesiologist: Anesthesiologist: Bethena Midget, MD CRNA: Earmon Phoenix, CRNA   Staff: Circulator: Lenn Cal, RN; Albin Felling, RN Scrub Person: Elvina Mattes Vendor Representative : Dora Sims  EBL: None  Post-op disposition and condition: PACU, hemodynamically stable  Findings: There was no evidence of complete concentric palatal obstruction and she is a candidate anatomically for hypoglossal nerve stimulation therapy.  Complications: None apparent  Indications and consent:  Bethany [name] is a 68 y.o. female with severe obstructive sleep apnea with intolerance of continuous positive airway pressure despite multiple masks. As such, with a BMI of 19, patient's options were discussed including Hypoglossal nerve stimulator placement. The patient's options were discussed, including risks/benefits/alternatives for each option. Patient expressed understanding, and despite these risks, consented and decided to proceed with drug induced sleep endoscopy to determine stimulator placement candidacy. Informed consent was signed before proceeding.  Operative note The patient was brought to the endoscopy room and was anesthetized via the standard drug-induced sleep endoscopy protocol using propofol pump. The room lights were dimmed. The propofol infusion rate was started at 75 mcg and gradually  increased at which point, conditions that mimic sleep were gradually observed.    With the patient not responsive to verbal commands, but still with spontaneous respiration, sleep disordered breathing events were clearly observed including light snoring.   Under these conditions, the flexible endoscope was inserted to examine both sides of the nose as well as the pharynx and larynx.   The VOTE score at baseline was partial AP , complete AP , partial AP, partial AP.  With simulated jaw thrust, the hypopharyngeal obstruction and secondarily the palatal collapse also improved.   In summary, there was no evidence of complete concentric palatal obstruction and she is a candidate anatomically for hypoglossal nerve stimulation therapy.  The anesthesia was then weaned and care transferred to anesthesia who transported patient to PACU in stable condition.   I was present for and performed the entire procedure.  Read Drivers

## 2023-12-20 NOTE — Anesthesia Postprocedure Evaluation (Signed)
 Anesthesia Post Note  Patient: Bethany [name]  Procedure(s) Performed: DRUG INDUCED ENDOSCOPY (Nose)     Patient location during evaluation: PACU Anesthesia Type: General Level of consciousness: awake and alert Pain management: pain level controlled Vital Signs Assessment: post-procedure vital signs reviewed and stable Respiratory status: spontaneous breathing, nonlabored ventilation, respiratory function stable and patient connected to nasal cannula oxygen Cardiovascular status: blood pressure returned to baseline and stable Postop Assessment: no apparent nausea or vomiting Anesthetic complications: no   No notable events documented.  Last Vitals:  Vitals:   12/20/23 1330 12/20/23 1400  BP: (!) 142/76 (!) 150/71  Pulse: (!) 59 63  Resp: 18 15  Temp:  36.6 C  SpO2: 99% 99%    Last Pain:  Vitals:   12/20/23 1400  TempSrc:   PainSc: 0-No pain                 Maxime Beckner

## 2023-12-20 NOTE — Transfer of Care (Signed)
 Immediate Anesthesia Transfer of Care Note  Patient: Bethany [name]  Procedure(s) Performed: DRUG INDUCED ENDOSCOPY (Nose)  Patient Location: PACU  Anesthesia Type:MAC  Level of Consciousness: drowsy and patient cooperative  Airway & Oxygen Therapy: Patient Spontanous Breathing  Post-op Assessment: Report given to RN and Post -op Vital signs reviewed and stable  Post vital signs: Reviewed and stable  Last Vitals:  Vitals Value Taken Time  BP 127/74 12/20/23 1305  Temp    Pulse 59 12/20/23 1307  Resp 12 12/20/23 1307  SpO2 100 % 12/20/23 1307  Vitals shown include unfiled device data.  Last Pain:  Vitals:   12/20/23 1152  TempSrc: Temporal  PainSc: 0-No pain      Patients Stated Pain Goal: 4 (12/20/23 1152)  Complications: No notable events documented.

## 2023-12-20 NOTE — H&P (Signed)
 Pre-Operative H&P - Day Of Surgery Patient Name: Bethany [name] Date:   12/20/2023  HPI: Bethany [name] is a 68 y.o. female who presents today for operative treatment of obstructive sleep apnea. Patient denies recent significant changes to health or significant new medications or physiologic change in condition which would immediately impact plans. No new types of therapy has been initiated that would change the plan or the appropriateness of the plan.   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 All Paps normal afterwards   Degenerative disc disease, lumbar 05/02/2020   Generalized anxiety disorder    Herpes genitalis    Herpes labialis    History of calcium pyrophosphate deposition disease (CPPD) 03/26/2020   History of opiate abuse, in remission    History of small bowel obstruction 04/17/2018   partial   Hyperlipidemia with target LDL less than 100 06/17/2011   IBS (irritable bowel syndrome)    Lumbago with sciatica, right side 02/10/2021   Major depressive disorder    Meibomian gland dysfunction (MGD) of both eyes 08/02/2020   Narcotic bowel syndrome (HCC)    Osteopenia 04/2014   T score -1.7 FRAX 7.3%/0.8% stable from prior DEXA 2013   Raynaud's disease 06/17/2011   Recovering alcoholic in remission     S/P LASIK (laser assisted in situ keratomileusis) 08/02/2020   right eye   Sleep apnea    Spondylolisthesis of lumbar region 02/10/2021   Status post total right knee replacement 10/31/2019   Tubular adenoma of colon 04/21/2016    PSH:  Past Surgical History:  Procedure Laterality Date   AUGMENTATION MAMMAPLASTY      BREAST ENHANCEMENT SURGERY  2006   CATARACT EXTRACTION Bilateral    Jan/Feb 2024   COSMETIC SURGERY     EYE SURGERY     lasik od   GYNECOLOGIC CRYOSURGERY  1990   LAPAROTOMY N/A 04/22/2018   Procedure: EXPLORATORY LAPAROTOMY;  Surgeon: Harriette Bouillon, MD;  Location: [facility] OR;  Service: General;  Laterality: N/A;   LAPAROTOMY N/A 05/14/2023   Procedure: EXPLORATORY LAPAROTOMY;  Surgeon: Harriette Bouillon, MD;  Location: [facility];  Service: General;  Laterality: N/A;   LIGAMENT REPAIR Right 09/24/2015   Procedure: RIGHT INDEX METACARPAL PHALANGEAL RADIAL COLLATERAL LIGAMENT REPAIR;  Surgeon: Betha Loa, MD;  Location: [facility];  Service: Orthopedics;  Laterality: Right;   LYSIS OF ADHESION N/A 05/14/2023   Procedure: LYSIS OF ADHESION, REPAIR OF SMALL BOWEL SEROSAL TEAR x 3 WITH FULL-THICKNESS REPAIR x 1;  Surgeon: Harriette Bouillon, MD;  Location: [facility] OR;  Service: General;  Laterality: N/A;   MASS EXCISION N/A 08/04/2018   Procedure: REMOVAL OF SUTURE FROM ABDOMEN/ERAS PATHWAY;  Surgeon: Harriette Bouillon, MD;  Location: [facility];  Service: General;  Laterality: N/A;   OOPHORECTOMY     BSO   Right knee arthoscopy Right 09/09/2017   [facility]   SHX 1 Left 02/19/2020   basel cell carcinoma   SHX1 Right 12/11/2020   basal cell carinoma,micronodular pattern   SKIN BIOPSY Right 02/28/2019   Superficial basal carcinoma    SKIN BIOPSY Left 11/24/2021   superficial basal carcinoma of  the left forearm   TONSILLECTOMY AND ADENOIDECTOMY     TOTAL KNEE ARTHROPLASTY Right 04/12/2018   TOTAL KNEE ARTHROPLASTY Right 04/12/2018   Procedure: RIGHT TOTAL KNEE ARTHROPLASTY;  Surgeon: Marcene Corning, MD;  Location: [facility];  Service: Orthopedics;  Laterality: Right;   TRANSFORAMINAL LUMBAR INTERBODY FUSION W/ MIS 1 LEVEL N/A 09/03/2021   Procedure: Lumbar four-five Minimally invasive transforaminal lumbar interbody fusion;  Surgeon: Dawley, Alan Mulder, DO;  Location: [facility];   Service: Neurosurgery;  Laterality: N/A;   TUBAL LIGATION     UPPER GASTROINTESTINAL ENDOSCOPY  04/24/2005   VAGINAL HYSTERECTOMY  2005   [facility] BSO  adenomyosis    MEDS:   Current Facility-Administered Medications:    lactated ringers infusion, , Intravenous, Continuous, Val Eagle, MD  ALLERGIES: Buprenorphine hcl and Morphine and codeine  EXAM: Vitals: BP (!) 142/91   Pulse 64   Temp 99 F (37.2 C) (Temporal)   Resp 16   Ht 5\' 5"  (1.651 m)   Wt 51.7 kg   SpO2 99%   BMI 18.97 kg/m   General Awake, at baseline alertness.   HEENT No scleral icterus or conjunctival hemorrhage. Globe position appears normal. External ears  normal. Nose patent without rhinorrhea. No lymphadenopathy. No thyromegaly  Cardiovascular No cyanosis.  Pulmonary No audible stridor. Breathing easily with no labor.  Neuro Symmetric facial movement.   Psychiatry Appropriate affect and mood.  Skin No scars or lesions on face or neck.  Extermities Moves all extremities with normal range of motion.   Other Findings None   Assessment & Plan: Bethany [name] has diagnoses of obstructive sleep apnea and will go to the OR today for drug induced sleep endoscopy. Informed consent was obtained and available in EMR today. All questions have been answered, and risks/benefits/alternatives of procedure as noted in the consent were discussed in a quiet area. Questions were invited and answered. The patient expressed understanding, provided consent and wished to proceed despite risks.  Read Drivers 12/20/2023 12:33 PM

## 2023-12-21 ENCOUNTER — Encounter (HOSPITAL_BASED_OUTPATIENT_CLINIC_OR_DEPARTMENT_OTHER): Payer: Self-pay | Admitting: Otolaryngology

## 2023-12-21 DIAGNOSIS — Z85828 Personal history of other malignant neoplasm of skin: Secondary | ICD-10-CM | POA: Diagnosis not present

## 2023-12-21 DIAGNOSIS — B078 Other viral warts: Secondary | ICD-10-CM | POA: Diagnosis not present

## 2023-12-21 DIAGNOSIS — L821 Other seborrheic keratosis: Secondary | ICD-10-CM | POA: Diagnosis not present

## 2023-12-21 DIAGNOSIS — L570 Actinic keratosis: Secondary | ICD-10-CM | POA: Diagnosis not present

## 2023-12-21 DIAGNOSIS — C44319 Basal cell carcinoma of skin of other parts of face: Secondary | ICD-10-CM | POA: Diagnosis not present

## 2023-12-22 ENCOUNTER — Institutional Professional Consult (permissible substitution) (INDEPENDENT_AMBULATORY_CARE_PROVIDER_SITE_OTHER): Payer: Medicare Other

## 2023-12-23 DIAGNOSIS — S46011D Strain of muscle(s) and tendon(s) of the rotator cuff of right shoulder, subsequent encounter: Secondary | ICD-10-CM | POA: Diagnosis not present

## 2023-12-24 ENCOUNTER — Telehealth (INDEPENDENT_AMBULATORY_CARE_PROVIDER_SITE_OTHER): Payer: Self-pay | Admitting: Otolaryngology

## 2023-12-24 DIAGNOSIS — G4733 Obstructive sleep apnea (adult) (pediatric): Secondary | ICD-10-CM

## 2023-12-24 DIAGNOSIS — Z789 Other specified health status: Secondary | ICD-10-CM

## 2023-12-24 HISTORY — PX: BASAL CELL CARCINOMA EXCISION: SHX1214

## 2023-12-24 NOTE — Telephone Encounter (Signed)
 Will post for [employer] given insurance approval and she is a candidate based on DISE Bethany [name]

## 2023-12-27 DIAGNOSIS — S46011D Strain of muscle(s) and tendon(s) of the rotator cuff of right shoulder, subsequent encounter: Secondary | ICD-10-CM | POA: Diagnosis not present

## 2023-12-27 NOTE — Progress Notes (Deleted)
 Bethany [name] [address] [place] [address] Phone: [phone] Subjective:    I'm seeing this patient by the request  of:  Bethany Arrow, MD  CC: Back and neck pain follow-up  [id number]  Bethany [name] Reasons is a 68 y.o. female coming in with complaint of back and neck pain.  Since we have seen patient was seen and had a small bowel obstruction and has had surgery previously.  Also has been going to formal physical therapy for her shoulder pain.  OMT 10/21/2023.  Appears that she is scheduled for a hypoglossal nerve stimulator to be placed June 27.  Patient states   Medications patient has been prescribed: None  Taking:         Reviewed prior external information including notes and imaging from previsou exam, outside providers and external EMR if available.   As well as notes that were available from care everywhere and other healthcare systems.  Past medical history, social, surgical and family history all reviewed in electronic medical record.  No pertanent information unless stated regarding to the chief complaint.   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 All Paps normal afterwards   Degenerative disc disease, lumbar 05/02/2020   Generalized anxiety disorder    Herpes genitalis    Herpes labialis    History of calcium pyrophosphate deposition disease (CPPD) 03/26/2020   History of opiate abuse, in remission    History of small bowel obstruction 04/17/2018   partial   Hyperlipidemia with target LDL less than 100 06/17/2011   IBS (irritable bowel syndrome)    Lumbago with sciatica, right side 02/10/2021   Major depressive disorder    Meibomian gland  dysfunction (MGD) of both eyes 08/02/2020   Narcotic bowel syndrome (HCC)    Osteopenia 04/2014   T score -1.7 FRAX 7.3%/0.8% stable from prior DEXA 2013   Raynaud's disease 06/17/2011   Recovering alcoholic in remission     S/P LASIK (laser assisted in situ keratomileusis) 08/02/2020   right eye   Sleep apnea    Spondylolisthesis of lumbar region 02/10/2021   Status post total right knee replacement 10/31/2019   Tubular adenoma of colon 04/21/2016    Allergies  Allergen Reactions   Buprenorphine Hcl Nausea And Vomiting   Morphine And Codeine Nausea Only     Review of Systems:  No headache, visual changes, nausea, vomiting, diarrhea, constipation, dizziness, abdominal pain, skin rash, fevers, chills, night sweats, weight loss, swollen lymph nodes, body aches, joint swelling, chest pain, shortness of breath, mood changes. POSITIVE muscle aches  Objective  There were no vitals taken for this visit.   General: No apparent distress alert and oriented x3 mood and affect normal, dressed appropriately.  HEENT: Pupils equal, extraocular movements intact  Respiratory: Patient's speak in full sentences and does not appear short of breath  Cardiovascular: No lower extremity edema, non tender, no erythema  Gait MSK:  Back   Osteopathic findings  C2 flexed rotated and side bent right C6 flexed rotated and side bent left T3 extended rotated and side bent right inhaled rib T9 extended rotated and side bent left L2 flexed rotated and side bent right Sacrum right on right  Assessment and Plan:  No problem-specific Assessment & Plan notes found for this encounter.    Nonallopathic problems  Decision today to treat with OMT was based on Physical Exam  After verbal consent patient was treated with HVLA, ME, FPR techniques in cervical, rib, thoracic, lumbar, and sacral  areas  Patient tolerated the procedure well with improvement in symptoms  Patient given exercises,  stretches and lifestyle modifications  See medications in patient instructions if given  Patient will follow up in 4-8 weeks     The above documentation has been reviewed and is accurate and complete Bethany Saa, DO         Note: This dictation was prepared with Dragon dictation along with smaller phrase technology. Any transcriptional errors that result from this process are unintentional.

## 2023-12-28 DIAGNOSIS — F1121 Opioid dependence, in remission: Secondary | ICD-10-CM | POA: Diagnosis not present

## 2023-12-29 ENCOUNTER — Encounter: Payer: Self-pay | Admitting: *Deleted

## 2024-01-03 DIAGNOSIS — M25561 Pain in right knee: Secondary | ICD-10-CM | POA: Diagnosis not present

## 2024-01-03 DIAGNOSIS — S46011D Strain of muscle(s) and tendon(s) of the rotator cuff of right shoulder, subsequent encounter: Secondary | ICD-10-CM | POA: Diagnosis not present

## 2024-01-05 ENCOUNTER — Ambulatory Visit: Payer: Medicare Other | Admitting: Family Medicine

## 2024-01-06 DIAGNOSIS — S46011D Strain of muscle(s) and tendon(s) of the rotator cuff of right shoulder, subsequent encounter: Secondary | ICD-10-CM | POA: Diagnosis not present

## 2024-01-10 DIAGNOSIS — S46011D Strain of muscle(s) and tendon(s) of the rotator cuff of right shoulder, subsequent encounter: Secondary | ICD-10-CM | POA: Diagnosis not present

## 2024-01-11 NOTE — Progress Notes (Deleted)
 [name] [address] [place] [address] Phone: [phone] Subjective:    I'm seeing this patient by the request  of:  Joselyn Arrow, MD  CC:   [id number]  Bethany [name] is a 68 y.o. female coming in with complaint of back and neck pain. OMT 10/21/2023. Patient states   Medications patient has been prescribed: None  Taking:         Reviewed prior external information including notes and imaging from previsou exam, outside providers and external EMR if available.   As well as notes that were available from care everywhere and other healthcare systems.  Past medical history, social, surgical and family history all reviewed in electronic medical record.  No pertanent information unless stated regarding to the chief complaint.   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 All Paps normal afterwards   Degenerative disc disease, lumbar 05/02/2020   Generalized anxiety disorder    Herpes genitalis    Herpes labialis    History of calcium pyrophosphate deposition disease (CPPD) 03/26/2020   History of opiate abuse, in remission    History of small bowel obstruction 04/17/2018   partial   Hyperlipidemia with target LDL less than 100 06/17/2011   IBS (irritable bowel syndrome)    Lumbago with sciatica, right side 02/10/2021   Major depressive disorder    Meibomian gland dysfunction (MGD) of both eyes 08/02/2020   Narcotic bowel syndrome (HCC)    Osteopenia 04/2014   T score -1.7 FRAX 7.3%/0.8% stable from prior DEXA 2013   Raynaud's disease 06/17/2011   Recovering alcoholic in remission     S/P LASIK (laser assisted in situ keratomileusis) 08/02/2020    right eye   Sleep apnea    Spondylolisthesis of lumbar region 02/10/2021   Status post total right knee replacement 10/31/2019   Tubular adenoma of colon 04/21/2016    Allergies  Allergen Reactions   Buprenorphine Hcl Nausea And Vomiting   Morphine And Codeine Nausea Only     Review of Systems:  No headache, visual changes, nausea, vomiting, diarrhea, constipation, dizziness, abdominal pain, skin rash, fevers, chills, night sweats, weight loss, swollen lymph nodes, body aches, joint swelling, chest pain, shortness of breath, mood changes. POSITIVE muscle aches  Objective  There were no vitals taken for this visit.   General: No apparent distress alert and oriented x3 mood and affect normal, dressed appropriately.  HEENT: Pupils equal, extraocular movements intact  Respiratory: Patient's speak in full sentences and does not appear short of breath  Cardiovascular: No lower extremity edema, non tender, no erythema  Gait MSK:  Back   Osteopathic findings  C2 flexed rotated and side bent right C6 flexed rotated and side bent left T3 extended rotated and side bent right inhaled rib T9 extended rotated and side bent left L2 flexed rotated and side bent right Sacrum right on right       Assessment and Plan:  No problem-specific Assessment & Plan notes found for this encounter.    Nonallopathic problems  Decision today to treat with OMT was based on Physical Exam  After verbal consent patient was treated with HVLA, ME, FPR techniques in cervical, rib, thoracic, lumbar, and sacral  areas  Patient tolerated the procedure well with improvement in symptoms  Patient given exercises, stretches and lifestyle modifications  See medications in patient instructions if given  Patient will follow up in 4-8 weeks             Note: This dictation was prepared with Dragon dictation along with smaller phrase technology. Any transcriptional errors that result from this  process are unintentional.

## 2024-01-12 DIAGNOSIS — L821 Other seborrheic keratosis: Secondary | ICD-10-CM | POA: Diagnosis not present

## 2024-01-12 DIAGNOSIS — L812 Freckles: Secondary | ICD-10-CM | POA: Diagnosis not present

## 2024-01-12 DIAGNOSIS — Z85828 Personal history of other malignant neoplasm of skin: Secondary | ICD-10-CM | POA: Diagnosis not present

## 2024-01-12 DIAGNOSIS — L570 Actinic keratosis: Secondary | ICD-10-CM | POA: Diagnosis not present

## 2024-01-12 DIAGNOSIS — D225 Melanocytic nevi of trunk: Secondary | ICD-10-CM | POA: Diagnosis not present

## 2024-01-13 ENCOUNTER — Ambulatory Visit: Admitting: Family Medicine

## 2024-01-13 NOTE — Progress Notes (Signed)
 [name] [address] [place] [address] Phone: [phone] Subjective:   INadine Counts, am serving as a scribe for Dr. Antoine Primas.  I'm seeing this patient by the request  of:  Joselyn Arrow, MD  CC: Back and neck pain follow-up  [id number]  Bethany [name] is a 68 y.o. female coming in with complaint of back and neck pain. OMT 10/21/2023. Patient states doing well. Had surgery in jan for shoulder.  Medications patient has been prescribed: None  Taking:      Patient is going to be scheduled in June for the inspire hypoglossal nerve stimulator.   Reviewed prior external information including notes and imaging from previsou exam, outside providers and external EMR if available.   As well as notes that were available from care everywhere and other healthcare systems.  Past medical history, social, surgical and family history all reviewed in electronic medical record.  No pertanent information unless stated regarding to the chief complaint.   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 All Paps normal afterwards   Degenerative disc disease, lumbar 05/02/2020   Generalized anxiety disorder    Herpes genitalis    Herpes labialis    History of calcium pyrophosphate deposition disease (CPPD) 03/26/2020   History of opiate abuse, in remission    History of small bowel obstruction 04/17/2018   partial   Hyperlipidemia with target LDL less than 100 06/17/2011   IBS (irritable bowel syndrome)    Lumbago with sciatica, right side 02/10/2021   Major depressive disorder    Meibomian gland dysfunction (MGD) of both eyes 08/02/2020   Narcotic bowel syndrome (HCC)     Osteopenia 04/2014   T score -1.7 FRAX 7.3%/0.8% stable from prior DEXA 2013   Raynaud's disease 06/17/2011   Recovering alcoholic in remission     S/P LASIK (laser assisted in situ keratomileusis) 08/02/2020   right eye   Sleep apnea    Spondylolisthesis of lumbar region 02/10/2021   Status post total right knee replacement 10/31/2019   Tubular adenoma of colon 04/21/2016    Allergies  Allergen Reactions   Buprenorphine Hcl Nausea And Vomiting   Morphine And Codeine Nausea Only     Review of Systems:  No headache, visual changes, nausea, vomiting, diarrhea, constipation, dizziness, abdominal pain, skin rash, fevers, chills, night sweats, weight loss, swollen lymph nodes, body aches, joint swelling, chest pain, shortness of breath, mood changes. POSITIVE muscle aches  Objective  Blood pressure 122/70, pulse 86, height 5\' 5"  (1.651 m), SpO2 96%.   General: No apparent distress alert and oriented x3 mood and affect normal, dressed appropriately.  HEENT: Pupils equal, extraocular movements intact  Respiratory: Patient's speak in full sentences and does not appear short of breath  Cardiovascular: No lower extremity edema, non tender, no erythema  Gait MSK:  Back does have some loss lordosis noted.  And some tenderness to palpation of the paraspinal musculature.  Patient does have tightness noted in the neck with sidebending.  This seems to be still favoring of the patient's shoulder I was recently repaired.  Osteopathic findings  C2 flexed rotated and side bent right C5 flexed rotated and side bent left T3 extended rotated  and side bent right inhaled rib T9 extended rotated and side bent left L2 flexed rotated and side bent right L3 F RS right  Sacrum right on right    Assessment and Plan:  Degenerative disc disease, lumbar Discussed HEP  Discussed avoiding some activities  RTC in 6-8 weeks     Nonallopathic problems  Decision today to treat with OMT was based on  Physical Exam  After verbal consent patient was treated with HVLA, ME, FPR techniques in cervical, rib, thoracic, lumbar, and sacral  areas  Patient tolerated the procedure well with improvement in symptoms  Patient given exercises, stretches and lifestyle modifications  See medications in patient instructions if given  Patient will follow up in 4-8 weeks     The above documentation has been reviewed and is accurate and complete Judi Saa, DO         Note: This dictation was prepared with Dragon dictation along with smaller phrase technology. Any transcriptional errors that result from this process are unintentional.

## 2024-01-17 DIAGNOSIS — S46011D Strain of muscle(s) and tendon(s) of the rotator cuff of right shoulder, subsequent encounter: Secondary | ICD-10-CM | POA: Diagnosis not present

## 2024-01-18 ENCOUNTER — Encounter: Payer: Self-pay | Admitting: Family Medicine

## 2024-01-18 ENCOUNTER — Ambulatory Visit (INDEPENDENT_AMBULATORY_CARE_PROVIDER_SITE_OTHER): Admitting: Family Medicine

## 2024-01-18 VITALS — BP 122/70 | HR 86 | Ht 65.0 in

## 2024-01-18 DIAGNOSIS — M9902 Segmental and somatic dysfunction of thoracic region: Secondary | ICD-10-CM

## 2024-01-18 DIAGNOSIS — M9903 Segmental and somatic dysfunction of lumbar region: Secondary | ICD-10-CM

## 2024-01-18 DIAGNOSIS — M9904 Segmental and somatic dysfunction of sacral region: Secondary | ICD-10-CM

## 2024-01-18 DIAGNOSIS — M9908 Segmental and somatic dysfunction of rib cage: Secondary | ICD-10-CM | POA: Diagnosis not present

## 2024-01-18 DIAGNOSIS — M9901 Segmental and somatic dysfunction of cervical region: Secondary | ICD-10-CM | POA: Diagnosis not present

## 2024-01-18 DIAGNOSIS — M51362 Other intervertebral disc degeneration, lumbar region with discogenic back pain and lower extremity pain: Secondary | ICD-10-CM | POA: Diagnosis not present

## 2024-01-18 NOTE — Patient Instructions (Signed)
 Good to see you! Keep being active, but baby the shoulder for a little while longer See you again in early May

## 2024-01-18 NOTE — Assessment & Plan Note (Signed)
 Discussed HEP  Discussed avoiding some activities  RTC in 6-8 weeks

## 2024-01-19 ENCOUNTER — Other Ambulatory Visit: Payer: Self-pay | Admitting: Gastroenterology

## 2024-01-20 DIAGNOSIS — S46011D Strain of muscle(s) and tendon(s) of the rotator cuff of right shoulder, subsequent encounter: Secondary | ICD-10-CM | POA: Diagnosis not present

## 2024-01-24 DIAGNOSIS — S46011D Strain of muscle(s) and tendon(s) of the rotator cuff of right shoulder, subsequent encounter: Secondary | ICD-10-CM | POA: Diagnosis not present

## 2024-01-25 DIAGNOSIS — F1121 Opioid dependence, in remission: Secondary | ICD-10-CM | POA: Diagnosis not present

## 2024-01-26 DIAGNOSIS — G4733 Obstructive sleep apnea (adult) (pediatric): Secondary | ICD-10-CM | POA: Diagnosis not present

## 2024-01-27 DIAGNOSIS — S46011D Strain of muscle(s) and tendon(s) of the rotator cuff of right shoulder, subsequent encounter: Secondary | ICD-10-CM | POA: Diagnosis not present

## 2024-01-31 DIAGNOSIS — G4733 Obstructive sleep apnea (adult) (pediatric): Secondary | ICD-10-CM | POA: Diagnosis not present

## 2024-02-01 DIAGNOSIS — H35372 Puckering of macula, left eye: Secondary | ICD-10-CM | POA: Diagnosis not present

## 2024-02-01 DIAGNOSIS — C44319 Basal cell carcinoma of skin of other parts of face: Secondary | ICD-10-CM | POA: Diagnosis not present

## 2024-02-03 DIAGNOSIS — M25561 Pain in right knee: Secondary | ICD-10-CM | POA: Diagnosis not present

## 2024-02-04 ENCOUNTER — Telehealth: Payer: Self-pay | Admitting: Physical Medicine & Rehabilitation

## 2024-02-04 NOTE — Telephone Encounter (Signed)
 Patient called in states she received a bill from [employer] due to insurance not receiving a prior auth , patient called [employer] and was informed to contact our office because no auth was provided to them

## 2024-02-07 DIAGNOSIS — S46011D Strain of muscle(s) and tendon(s) of the rotator cuff of right shoulder, subsequent encounter: Secondary | ICD-10-CM | POA: Diagnosis not present

## 2024-02-11 DIAGNOSIS — S46011D Strain of muscle(s) and tendon(s) of the rotator cuff of right shoulder, subsequent encounter: Secondary | ICD-10-CM | POA: Diagnosis not present

## 2024-02-14 DIAGNOSIS — S46011D Strain of muscle(s) and tendon(s) of the rotator cuff of right shoulder, subsequent encounter: Secondary | ICD-10-CM | POA: Diagnosis not present

## 2024-02-16 DIAGNOSIS — L570 Actinic keratosis: Secondary | ICD-10-CM | POA: Diagnosis not present

## 2024-02-17 DIAGNOSIS — S46011D Strain of muscle(s) and tendon(s) of the rotator cuff of right shoulder, subsequent encounter: Secondary | ICD-10-CM | POA: Diagnosis not present

## 2024-02-29 DIAGNOSIS — S46011D Strain of muscle(s) and tendon(s) of the rotator cuff of right shoulder, subsequent encounter: Secondary | ICD-10-CM | POA: Diagnosis not present

## 2024-03-01 ENCOUNTER — Ambulatory Visit: Admitting: Family Medicine

## 2024-03-02 ENCOUNTER — Other Ambulatory Visit: Payer: Self-pay | Admitting: Gastroenterology

## 2024-03-03 ENCOUNTER — Encounter: Payer: Self-pay | Admitting: Gastroenterology

## 2024-03-03 NOTE — Telephone Encounter (Signed)
 I don't remember ever doing one. Maybe Dr. Alessandra Ancona did them

## 2024-03-04 DIAGNOSIS — M25561 Pain in right knee: Secondary | ICD-10-CM | POA: Diagnosis not present

## 2024-03-08 ENCOUNTER — Ambulatory Visit: Admitting: Family Medicine

## 2024-03-08 NOTE — Progress Notes (Signed)
 [facility] [address] [place] [address] Phone: [phone] Subjective:   Bethany [name], am serving as a scribe for Dr. Ronnell Coins.  I'm seeing this patient by the request  of:  Roosvelt Colla, MD  CC: Back and neck pain follow-up  [id number]  Bethany [name] is a 68 y.o. female coming in with complaint of back and neck pain. OMT 01/13/2024.  Patient states that her lower back pain has increased. Painful to stand for more than 1 hour. Tries to walk 5 miles and works out at [employer] but her back pain prevents her from continuing to be active. Some days she does not have pain. Pain worse on R side and will radiate into the R quad.   Medications patient has been prescribed: None  Taking:         Reviewed prior external information including notes and imaging from previsou exam, outside providers and external EMR if available.   As well as notes that were available from care everywhere and other healthcare systems.  Past medical history, social, surgical and family history all reviewed in electronic medical record.  No pertanent information unless stated regarding to the chief complaint.   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) joint of left thumb 01/18/2020   Injected January 18, 2020   Cervical dysplasia 1990   CIN 2 subsequent cryosurgery. All Paps normal afterwards   Degenerative disc disease, lumbar 05/02/2020   Generalized anxiety disorder    Herpes genitalis    Herpes labialis    History of calcium pyrophosphate deposition disease (CPPD) 03/26/2020   History of opiate abuse, in remission    History of small bowel obstruction 04/17/2018   partial   Hyperlipidemia with target LDL less than 100 06/17/2011   IBS (irritable bowel syndrome)    Lumbago with sciatica,  right side 02/10/2021   Major depressive disorder    Meibomian gland dysfunction (MGD) of both eyes 08/02/2020   Narcotic bowel syndrome (HCC)    Osteopenia 04/2014   T score -1.7 FRAX 7.3%/0.8% stable from prior DEXA 2013   Raynaud's disease 06/17/2011   Recovering alcoholic in remission     S/P LASIK (laser assisted in situ keratomileusis) 08/02/2020   right eye   Sleep apnea    Spondylolisthesis of lumbar region 02/10/2021   Status post total right knee replacement 10/31/2019   Tubular adenoma of colon 04/21/2016    Allergies  Allergen Reactions   Buprenorphine Hcl Nausea And Vomiting   Morphine  And Codeine Nausea Only     Review of Systems:  No headache, visual changes, nausea, vomiting, diarrhea, constipation, dizziness, abdominal pain, skin rash, fevers, chills, night sweats, weight loss, swollen lymph nodes, body aches, joint swelling, chest pain, shortness of breath, mood changes. POSITIVE muscle aches  Objective  Blood pressure 118/88, pulse 71, height 5\' 5"  (1.651 m), weight 116 lb (52.6 kg), SpO2 95%.   General: No apparent distress alert and oriented x3 mood and affect normal, dressed appropriately.  HEENT: Pupils equal, extraocular movements intact  Respiratory: Patient's speak in full sentences and does not appear short of breath  Cardiovascular: No lower extremity edema, non tender, no erythema  Gait relatively normal. MSK:  Back does have significant loss of lordosis.  Significant tightness noted on the right side.  Positive straight leg test  noted.  4 out of 5 strength to dorsi flexion of the right foot compared to the left foot.  Osteopathic findings  C2 flexed rotated and side bent right C6 flexed rotated and side bent left T3 extended rotated and side bent right inhaled rib T9 extended rotated and side bent left L2 flexed rotated and side bent right L4 flexed rotated and side bent left L5 flexed rotated and side bent left Sacrum right on right     Assessment and Plan:  Lumbago with sciatica, right side Worsening pain again.  Been over 2 years since the surgery.  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  Affecting daily activities and on a trip.  Discussed with patient at great length.  Working her up at night as well.  No longer responding to medication such as the gabapentin .  Will get x-rays and MRI to further evaluate.  Depending on findings we will see if patient is able to have any type of injection and avoid any more surgical intervention.   Toradol  Depo-Medrol  given injection today. Nonallopathic problems  Decision today to treat with OMT was based on Physical Exam  After verbal consent patient was treated with HVLA, ME, FPR techniques in cervical, rib, thoracic, lumbar, and sacral  areas  Patient tolerated the procedure well with improvement in symptoms  Patient given exercises, stretches and lifestyle modifications  See medications in patient instructions if given  Patient will follow up in 4-8 weeks    The above documentation has been reviewed and is accurate and complete Kewanna Kasprzak M Crue Otero, DO          Note: This dictation was prepared with Dragon dictation along with smaller phrase technology. Any transcriptional errors that result from this process are unintentional.

## 2024-03-14 ENCOUNTER — Ambulatory Visit: Admitting: Family Medicine

## 2024-03-14 ENCOUNTER — Encounter: Payer: Self-pay | Admitting: Family Medicine

## 2024-03-14 VITALS — BP 118/88 | HR 71 | Ht 65.0 in | Wt 116.0 lb

## 2024-03-14 DIAGNOSIS — G8929 Other chronic pain: Secondary | ICD-10-CM | POA: Diagnosis not present

## 2024-03-14 DIAGNOSIS — M9904 Segmental and somatic dysfunction of sacral region: Secondary | ICD-10-CM | POA: Diagnosis not present

## 2024-03-14 DIAGNOSIS — M9902 Segmental and somatic dysfunction of thoracic region: Secondary | ICD-10-CM

## 2024-03-14 DIAGNOSIS — M5441 Lumbago with sciatica, right side: Secondary | ICD-10-CM

## 2024-03-14 DIAGNOSIS — M5416 Radiculopathy, lumbar region: Secondary | ICD-10-CM

## 2024-03-14 DIAGNOSIS — M9903 Segmental and somatic dysfunction of lumbar region: Secondary | ICD-10-CM | POA: Diagnosis not present

## 2024-03-14 DIAGNOSIS — L570 Actinic keratosis: Secondary | ICD-10-CM | POA: Diagnosis not present

## 2024-03-14 DIAGNOSIS — M9908 Segmental and somatic dysfunction of rib cage: Secondary | ICD-10-CM

## 2024-03-14 DIAGNOSIS — M9901 Segmental and somatic dysfunction of cervical region: Secondary | ICD-10-CM

## 2024-03-14 MED ORDER — PREDNISONE 20 MG PO TABS: 40.0000 mg | ORAL_TABLET | Freq: Every day | ORAL | 0 refills | Status: DC

## 2024-03-14 MED ORDER — METHYLPREDNISOLONE ACETATE 80 MG/ML IJ SUSP
80.0000 mg | Freq: Once | INTRAMUSCULAR | Status: AC
  Administered 2024-03-14: 80 mg via INTRAMUSCULAR

## 2024-03-14 MED ORDER — AZITHROMYCIN 500 MG PO TABS: 500.0000 mg | ORAL_TABLET | Freq: Every day | ORAL | 0 refills | Status: DC

## 2024-03-14 MED ORDER — KETOROLAC TROMETHAMINE 30 MG/ML IJ SOLN
60.0000 mg | Freq: Once | INTRAMUSCULAR | Status: AC
  Administered 2024-03-14: 60 mg via INTRAMUSCULAR

## 2024-03-14 NOTE — Patient Instructions (Signed)
 Prednisone  40mg  for 10 days Zpack 500mg  daily for 7 days but only take for 3 days Injections in backside See me again in 7-8 weeks

## 2024-03-14 NOTE — Assessment & Plan Note (Signed)
 Worsening pain again.  Been over 2 years since the surgery.  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  Affecting daily activities and on a trip.  Discussed with patient at great length.  Working her up at night as well.  No longer responding to medication such as the gabapentin .  Will get x-rays and MRI to further evaluate.  Depending on findings we will see if patient is able to have any type of injection and avoid any more surgical intervention.

## 2024-03-19 ENCOUNTER — Inpatient Hospital Stay: Admission: RE | Admit: 2024-03-19 | Source: Ambulatory Visit

## 2024-04-04 DIAGNOSIS — M25561 Pain in right knee: Secondary | ICD-10-CM | POA: Diagnosis not present

## 2024-04-10 ENCOUNTER — Encounter: Payer: Self-pay | Admitting: Family Medicine

## 2024-04-13 ENCOUNTER — Encounter (INDEPENDENT_AMBULATORY_CARE_PROVIDER_SITE_OTHER): Payer: Self-pay | Admitting: Otolaryngology

## 2024-04-13 ENCOUNTER — Ambulatory Visit (INDEPENDENT_AMBULATORY_CARE_PROVIDER_SITE_OTHER): Payer: Medicare Other | Admitting: Otolaryngology

## 2024-04-13 VITALS — BP 138/84 | HR 66 | Ht 65.0 in | Wt 112.0 lb

## 2024-04-13 DIAGNOSIS — G4733 Obstructive sleep apnea (adult) (pediatric): Secondary | ICD-10-CM

## 2024-04-13 DIAGNOSIS — Z789 Other specified health status: Secondary | ICD-10-CM

## 2024-04-13 NOTE — Progress Notes (Signed)
 ENT Pre-op Note: Discussed with expectations regarding surgery and recovery We had a discussion regarding risks of Inspire including lack of benefit, persistent symptoms, pneumothorax, tongue soreness or weakness, Floor of mouth numbness, injury to major vessels, hematoma, implant infection, bleeding, scarring, tethering of neck, persistent symptoms, need for further procedures, and risk of anesthesia among others.  Patient is ready to proceed.   No charge since pre-op visit.

## 2024-04-17 ENCOUNTER — Telehealth (INDEPENDENT_AMBULATORY_CARE_PROVIDER_SITE_OTHER): Payer: Self-pay | Admitting: Otolaryngology

## 2024-04-17 ENCOUNTER — Ambulatory Visit
Admission: RE | Admit: 2024-04-17 | Discharge: 2024-04-17 | Disposition: A | Discharge status: Home / Self Care | Source: Ambulatory Visit | Attending: Family Medicine | Admitting: Family Medicine

## 2024-04-17 ENCOUNTER — Telehealth (INDEPENDENT_AMBULATORY_CARE_PROVIDER_SITE_OTHER): Payer: Self-pay

## 2024-04-17 DIAGNOSIS — M47816 Spondylosis without myelopathy or radiculopathy, lumbar region: Secondary | ICD-10-CM | POA: Diagnosis not present

## 2024-04-17 DIAGNOSIS — Z981 Arthrodesis status: Secondary | ICD-10-CM | POA: Diagnosis not present

## 2024-04-17 DIAGNOSIS — M5126 Other intervertebral disc displacement, lumbar region: Secondary | ICD-10-CM | POA: Diagnosis not present

## 2024-04-17 DIAGNOSIS — M5416 Radiculopathy, lumbar region: Secondary | ICD-10-CM

## 2024-04-17 NOTE — Telephone Encounter (Signed)
 Patient called and stated she had been sick, but was feeling better, she was wondering if she needed to cancel her surgery.  Per Dr. Lydia Sams since she was still 10 days from surgery she should be fine, just let anesthesia know when they called her.

## 2024-04-17 NOTE — Telephone Encounter (Signed)
 Would you mind letting her know? THanks

## 2024-04-17 NOTE — Telephone Encounter (Signed)
 04/17/24  Patient called with a concern with her upcoming surgery on 06/27. Patient has been sick but is now starting to feel better. Her concern is she able to have the surgery or does she need to reschedule? Please advise

## 2024-04-18 DIAGNOSIS — F411 Generalized anxiety disorder: Secondary | ICD-10-CM | POA: Diagnosis not present

## 2024-04-19 ENCOUNTER — Encounter: Payer: Self-pay | Admitting: Family Medicine

## 2024-04-19 NOTE — Telephone Encounter (Signed)
 Spoke to patient and informed her of providers recommendations. Patient verbalizes understanding.

## 2024-04-20 ENCOUNTER — Other Ambulatory Visit: Payer: Self-pay

## 2024-04-20 ENCOUNTER — Encounter (HOSPITAL_BASED_OUTPATIENT_CLINIC_OR_DEPARTMENT_OTHER): Payer: Self-pay

## 2024-04-20 NOTE — Progress Notes (Unsigned)
 [facility] [address] [place] [address] Phone: [phone] Subjective:    I'm seeing this patient by the request  of:  Roosvelt Colla, MD  CC:   [id number]  Bethany [name] is a 68 y.o. female coming in with complaint of back and neck pain. OMT 03/14/2024. Patient states   Medications patient has been prescribed:   Taking:    MRI lumbar 04/17/2024     Reviewed prior external information including notes and imaging from previsou exam, outside providers and external EMR if available.   As well as notes that were available from care everywhere and other healthcare systems.  Past medical history, social, surgical and family history all reviewed in electronic medical record.  No pertanent information unless stated regarding to the chief complaint.   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 All Paps normal afterwards   Degenerative disc disease, lumbar 05/02/2020   Generalized anxiety disorder    Herpes genitalis    Herpes labialis    History of calcium pyrophosphate deposition disease (CPPD) 03/26/2020   History of opiate abuse, in remission    History of small bowel obstruction 04/17/2018   partial   Hyperlipidemia with target LDL less than 100 06/17/2011   IBS (irritable bowel syndrome)    Lumbago with sciatica, right side 02/10/2021   Major depressive disorder    Meibomian gland dysfunction (MGD) of both eyes 08/02/2020   Narcotic bowel syndrome (HCC)    Osteopenia 04/2014   T score -1.7 FRAX 7.3%/0.8% stable from prior DEXA 2013   Raynaud's disease 06/17/2011   Recovering alcoholic in remission     S/P LASIK (laser assisted in situ keratomileusis)  08/02/2020   right eye   Sleep apnea    Spondylolisthesis of lumbar region 02/10/2021   Status post total right knee replacement 10/31/2019   Tubular adenoma of colon 04/21/2016    Allergies  Allergen Reactions   Buprenorphine Hcl Nausea And Vomiting   Morphine  And Codeine Nausea Only     Review of Systems:  No headache, visual changes, nausea, vomiting, diarrhea, constipation, dizziness, abdominal pain, skin rash, fevers, chills, night sweats, weight loss, swollen lymph nodes, body aches, joint swelling, chest pain, shortness of breath, mood changes. POSITIVE muscle aches  Objective  There were no vitals taken for this visit.   General: No apparent distress alert and oriented x3 mood and affect normal, dressed appropriately.  HEENT: Pupils equal, extraocular movements intact  Respiratory: Patient's speak in full sentences and does not appear short of breath  Cardiovascular: No lower extremity edema, non tender, no erythema  Gait MSK:  Back   Osteopathic findings  C2 flexed rotated and side bent right C6 flexed rotated and side bent left T3 extended rotated and side bent right inhaled rib T9 extended rotated and side bent left L2 flexed rotated and side bent right Sacrum right on right       Assessment and Plan:  No problem-specific Assessment & Plan notes found for this encounter.    Nonallopathic problems  Decision today to treat with OMT was based on Physical Exam  After verbal consent patient was treated with HVLA, ME, FPR techniques in cervical, rib, thoracic, lumbar, and  sacral  areas  Patient tolerated the procedure well with improvement in symptoms  Patient given exercises, stretches and lifestyle modifications  See medications in patient instructions if given  Patient will follow up in 4-8 weeks             Note: This dictation was prepared with Dragon dictation along with smaller phrase technology. Any transcriptional errors that result  from this process are unintentional.

## 2024-04-21 ENCOUNTER — Ambulatory Visit: Payer: Self-pay | Admitting: Family Medicine

## 2024-04-24 ENCOUNTER — Other Ambulatory Visit: Payer: Self-pay

## 2024-04-24 DIAGNOSIS — M5416 Radiculopathy, lumbar region: Secondary | ICD-10-CM

## 2024-04-26 ENCOUNTER — Encounter: Payer: Self-pay | Admitting: Family Medicine

## 2024-04-26 ENCOUNTER — Ambulatory Visit (INDEPENDENT_AMBULATORY_CARE_PROVIDER_SITE_OTHER): Admitting: Family Medicine

## 2024-04-26 VITALS — BP 102/68 | HR 78 | Ht 65.0 in | Wt 113.0 lb

## 2024-04-26 DIAGNOSIS — M9902 Segmental and somatic dysfunction of thoracic region: Secondary | ICD-10-CM

## 2024-04-26 DIAGNOSIS — M4316 Spondylolisthesis, lumbar region: Secondary | ICD-10-CM | POA: Diagnosis not present

## 2024-04-26 DIAGNOSIS — G8929 Other chronic pain: Secondary | ICD-10-CM

## 2024-04-26 DIAGNOSIS — M9901 Segmental and somatic dysfunction of cervical region: Secondary | ICD-10-CM

## 2024-04-26 DIAGNOSIS — M9908 Segmental and somatic dysfunction of rib cage: Secondary | ICD-10-CM | POA: Diagnosis not present

## 2024-04-26 DIAGNOSIS — M5416 Radiculopathy, lumbar region: Secondary | ICD-10-CM | POA: Diagnosis not present

## 2024-04-26 DIAGNOSIS — M5441 Lumbago with sciatica, right side: Secondary | ICD-10-CM

## 2024-04-26 NOTE — Assessment & Plan Note (Signed)
 Unfortunately adjacent segment disease with some spinal stenosis noted at L3-L4.  Epidural ordered today in the hope that this will make significant improvement.  Likely will restart osteopathic manipulation after this.  Discussed icing regimen and home exercises.  Follow-up again in 6 to 8 weeks.

## 2024-04-26 NOTE — Patient Instructions (Addendum)
 Epidural [phone] See me again in 8 weeks

## 2024-04-28 ENCOUNTER — Ambulatory Visit (HOSPITAL_BASED_OUTPATIENT_CLINIC_OR_DEPARTMENT_OTHER): Payer: Self-pay | Admitting: Anesthesiology

## 2024-04-28 ENCOUNTER — Ambulatory Visit (HOSPITAL_COMMUNITY)

## 2024-04-28 ENCOUNTER — Ambulatory Visit (HOSPITAL_BASED_OUTPATIENT_CLINIC_OR_DEPARTMENT_OTHER)
Admission: RE | Admit: 2024-04-28 | Discharge: 2024-04-28 | Disposition: A | Discharge status: Home / Self Care | Payer: Medicare Other | Attending: Otolaryngology | Admitting: Otolaryngology

## 2024-04-28 ENCOUNTER — Other Ambulatory Visit: Payer: Self-pay

## 2024-04-28 ENCOUNTER — Ambulatory Visit (HOSPITAL_BASED_OUTPATIENT_CLINIC_OR_DEPARTMENT_OTHER)
Admission: RE | Disposition: A | Discharge status: Home / Self Care | Payer: Self-pay | Source: Home / Self Care | Attending: Otolaryngology

## 2024-04-28 ENCOUNTER — Telehealth (INDEPENDENT_AMBULATORY_CARE_PROVIDER_SITE_OTHER): Payer: Self-pay | Admitting: Otolaryngology

## 2024-04-28 ENCOUNTER — Encounter (HOSPITAL_BASED_OUTPATIENT_CLINIC_OR_DEPARTMENT_OTHER): Payer: Self-pay

## 2024-04-28 ENCOUNTER — Telehealth (INDEPENDENT_AMBULATORY_CARE_PROVIDER_SITE_OTHER): Payer: Self-pay

## 2024-04-28 DIAGNOSIS — E785 Hyperlipidemia, unspecified: Secondary | ICD-10-CM

## 2024-04-28 DIAGNOSIS — Z01818 Encounter for other preprocedural examination: Secondary | ICD-10-CM

## 2024-04-28 DIAGNOSIS — Z681 Body mass index (BMI) 19 or less, adult: Secondary | ICD-10-CM

## 2024-04-28 DIAGNOSIS — Z91198 Patient's noncompliance with other medical treatment and regimen for other reason: Secondary | ICD-10-CM

## 2024-04-28 DIAGNOSIS — G4733 Obstructive sleep apnea (adult) (pediatric): Secondary | ICD-10-CM

## 2024-04-28 DIAGNOSIS — Z789 Other specified health status: Secondary | ICD-10-CM | POA: Insufficient documentation

## 2024-04-28 DIAGNOSIS — F418 Other specified anxiety disorders: Secondary | ICD-10-CM

## 2024-04-28 DIAGNOSIS — Z87891 Personal history of nicotine dependence: Secondary | ICD-10-CM | POA: Diagnosis not present

## 2024-04-28 DIAGNOSIS — Z0389 Encounter for observation for other suspected diseases and conditions ruled out: Secondary | ICD-10-CM | POA: Diagnosis not present

## 2024-04-28 DIAGNOSIS — I700 Atherosclerosis of aorta: Secondary | ICD-10-CM | POA: Diagnosis not present

## 2024-04-28 HISTORY — PX: IMPLANTATION OF HYPOGLOSSAL NERVE STIMULATOR: SHX6827

## 2024-04-28 SURGERY — INSERTION, HYPOGLOSSAL NERVE STIMULATOR
Anesthesia: General | Site: Neck | Laterality: Right

## 2024-04-28 MED ORDER — MIDAZOLAM HCL 5 MG/5ML IJ SOLN
INTRAMUSCULAR | Status: DC | PRN
  Administered 2024-04-28: 2 mg via INTRAVENOUS

## 2024-04-28 MED ORDER — LIDOCAINE 2% (20 MG/ML) 5 ML SYRINGE
INTRAMUSCULAR | Status: AC
  Filled 2024-04-28: qty 5

## 2024-04-28 MED ORDER — LIDOCAINE 2% (20 MG/ML) 5 ML SYRINGE
INTRAMUSCULAR | Status: DC | PRN
  Administered 2024-04-28: 40 mg via INTRAVENOUS

## 2024-04-28 MED ORDER — HYDROMORPHONE HCL 1 MG/ML IJ SOLN
0.2500 mg | INTRAMUSCULAR | Status: DC | PRN
  Administered 2024-04-28 (×2): 0.5 mg via INTRAVENOUS

## 2024-04-28 MED ORDER — HYDROMORPHONE HCL 1 MG/ML IJ SOLN
INTRAMUSCULAR | Status: AC
  Filled 2024-04-28: qty 0.5

## 2024-04-28 MED ORDER — SODIUM CHLORIDE 0.9 % IV SOLN
INTRAVENOUS | Status: DC | PRN
  Administered 2024-04-28: 500 mL

## 2024-04-28 MED ORDER — SUCCINYLCHOLINE CHLORIDE 200 MG/10ML IV SOSY
PREFILLED_SYRINGE | INTRAVENOUS | Status: AC
  Filled 2024-04-28: qty 10

## 2024-04-28 MED ORDER — OXYCODONE HCL 5 MG PO TABS: 5.0000 mg | ORAL_TABLET | Freq: Four times a day (QID) | ORAL | 0 refills | Status: DC | PRN

## 2024-04-28 MED ORDER — ACETAMINOPHEN 500 MG PO TABS
1000.0000 mg | ORAL_TABLET | Freq: Once | ORAL | Status: AC
  Administered 2024-04-28: 1000 mg via ORAL

## 2024-04-28 MED ORDER — ACETAMINOPHEN 500 MG PO TABS
ORAL_TABLET | ORAL | Status: AC
  Filled 2024-04-28: qty 2

## 2024-04-28 MED ORDER — DEXMEDETOMIDINE HCL IN NACL 80 MCG/20ML IV SOLN
INTRAVENOUS | Status: DC | PRN
  Administered 2024-04-28: 12 ug via INTRAVENOUS

## 2024-04-28 MED ORDER — FENTANYL CITRATE (PF) 100 MCG/2ML IJ SOLN
INTRAMUSCULAR | Status: DC | PRN
  Administered 2024-04-28 (×2): 50 ug via INTRAVENOUS

## 2024-04-28 MED ORDER — OXYCODONE HCL 5 MG PO CAPS: 5.0000 mg | ORAL_CAPSULE | Freq: Four times a day (QID) | ORAL | 0 refills | Status: AC | PRN

## 2024-04-28 MED ORDER — ONDANSETRON HCL 4 MG/2ML IJ SOLN
INTRAMUSCULAR | Status: DC | PRN
  Administered 2024-04-28: 4 mg via INTRAVENOUS

## 2024-04-28 MED ORDER — LIDOCAINE-EPINEPHRINE 1 %-1:100000 IJ SOLN
INTRAMUSCULAR | Status: DC | PRN
Start: 2024-04-28 — End: 2024-04-28
  Administered 2024-04-28: 8 mL

## 2024-04-28 MED ORDER — ACETAMINOPHEN 500 MG PO TABS: 1000.0000 mg | ORAL_TABLET | Freq: Four times a day (QID) | ORAL | 0 refills | Status: AC

## 2024-04-28 MED ORDER — CEPHALEXIN 500 MG PO CAPS: 500.0000 mg | ORAL_CAPSULE | Freq: Two times a day (BID) | ORAL | 0 refills | Status: AC

## 2024-04-28 MED ORDER — FENTANYL CITRATE (PF) 100 MCG/2ML IJ SOLN
INTRAMUSCULAR | Status: AC
  Filled 2024-04-28: qty 2

## 2024-04-28 MED ORDER — SODIUM CHLORIDE 0.9 % IV SOLN
INTRAVENOUS | Status: AC
  Filled 2024-04-28: qty 10

## 2024-04-28 MED ORDER — EPHEDRINE SULFATE (PRESSORS) 50 MG/ML IJ SOLN
INTRAMUSCULAR | Status: DC | PRN
Start: 2024-04-28 — End: 2024-04-28
  Administered 2024-04-28: 5 mg via INTRAVENOUS
  Administered 2024-04-28 (×2): 10 mg via INTRAVENOUS

## 2024-04-28 MED ORDER — MIDAZOLAM HCL 2 MG/2ML IJ SOLN
INTRAMUSCULAR | Status: AC
  Filled 2024-04-28: qty 2

## 2024-04-28 MED ORDER — SUCCINYLCHOLINE CHLORIDE 200 MG/10ML IV SOSY
PREFILLED_SYRINGE | INTRAVENOUS | Status: DC | PRN
  Administered 2024-04-28: 100 mg via INTRAVENOUS

## 2024-04-28 MED ORDER — PHENYLEPHRINE HCL (PRESSORS) 10 MG/ML IV SOLN
INTRAVENOUS | Status: DC | PRN
  Administered 2024-04-28 (×3): 80 ug via INTRAVENOUS
  Administered 2024-04-28: 160 ug via INTRAVENOUS
  Administered 2024-04-28 (×6): 80 ug via INTRAVENOUS

## 2024-04-28 MED ORDER — ONDANSETRON HCL 4 MG/2ML IJ SOLN
INTRAMUSCULAR | Status: AC
  Filled 2024-04-28: qty 2

## 2024-04-28 MED ORDER — CEFAZOLIN SODIUM-DEXTROSE 2-3 GM-%(50ML) IV SOLR
INTRAVENOUS | Status: DC | PRN
  Administered 2024-04-28: 2 g via INTRAVENOUS

## 2024-04-28 MED ORDER — DEXAMETHASONE SODIUM PHOSPHATE 10 MG/ML IJ SOLN
INTRAMUSCULAR | Status: AC
  Filled 2024-04-28: qty 1

## 2024-04-28 MED ORDER — DEXAMETHASONE SODIUM PHOSPHATE 4 MG/ML IJ SOLN
INTRAMUSCULAR | Status: DC | PRN
  Administered 2024-04-28: 10 mg via INTRAVENOUS

## 2024-04-28 MED ORDER — PROPOFOL 10 MG/ML IV BOLUS
INTRAVENOUS | Status: DC | PRN
Start: 2024-04-28 — End: 2024-04-28
  Administered 2024-04-28: 100 mg via INTRAVENOUS

## 2024-04-28 MED ORDER — LACTATED RINGERS IV SOLN: INTRAVENOUS | Status: DC

## 2024-04-28 SURGICAL SUPPLY — 72 items
BAG DECANTER FOR FLEXI CONT (MISCELLANEOUS) IMPLANT
BLADE CLIPPER SURG (BLADE) IMPLANT
BLADE SURG 15 STRL LF DISP TIS (BLADE) ×1 IMPLANT
CANISTER SUCT 1200ML W/VALVE (MISCELLANEOUS) ×1 IMPLANT
CHLORAPREP W/TINT 26 (MISCELLANEOUS) ×2 IMPLANT
CLIP TI WIDE RED SMALL 6 (CLIP) IMPLANT
CORD BIPOLAR FORCEPS 12FT (ELECTRODE) ×1 IMPLANT
COVER PROBE CYLINDRICAL 5X96 (MISCELLANEOUS) ×1 IMPLANT
DERMABOND ADVANCED .7 DNX12 (GAUZE/BANDAGES/DRESSINGS) ×1 IMPLANT
DRAPE C-ARM 35X43 STRL (DRAPES) ×1 IMPLANT
DRAPE HEAD BAR (DRAPES) IMPLANT
DRAPE INCISE IOBAN 66X45 STRL (DRAPES) ×1 IMPLANT
DRAPE MICROSCOPE WILD 40.5X102 (DRAPES) IMPLANT
DRAPE UTILITY XL STRL (DRAPES) ×1 IMPLANT
DRSG TEGADERM 2-3/8X2-3/4 SM (GAUZE/BANDAGES/DRESSINGS) ×2 IMPLANT
DRSG TEGADERM 4X4.75 (GAUZE/BANDAGES/DRESSINGS) ×1 IMPLANT
ELECT COATED BLADE 2.86 ST (ELECTRODE) ×1 IMPLANT
ELECTRODE EMG 18 NIMS (NEUROSURGERY SUPPLIES) ×1 IMPLANT
ELECTRODE REM PT RTRN 9FT ADLT (ELECTROSURGICAL) ×1 IMPLANT
FORCEPS BIPOLAR SPETZLER 8 1.0 (NEUROSURGERY SUPPLIES) ×1 IMPLANT
GAUZE 4X4 16PLY ~~LOC~~+RFID DBL (SPONGE) ×1 IMPLANT
GAUZE SPONGE 4X4 12PLY STRL (GAUZE/BANDAGES/DRESSINGS) ×1 IMPLANT
GENERATOR PULSE INSPIRE (Generator) ×1 IMPLANT
GENERATOR PULSE INSPIRE IV (Generator) ×1 IMPLANT
GLOVE BIO SURGEON STRL SZ 6.5 (GLOVE) ×1 IMPLANT
GLOVE BIO SURGEON STRL SZ7.5 (GLOVE) ×1 IMPLANT
GLOVE BIOGEL PI IND STRL 7.0 (GLOVE) IMPLANT
GLOVE BIOGEL PI IND STRL 7.5 (GLOVE) IMPLANT
GLOVE BIOGEL PI MICRO STRL 6.5 (GLOVE) IMPLANT
GLOVE SURG SS PI 6.5 STRL IVOR (GLOVE) IMPLANT
GLOVE SURG SS PI 7.0 STRL IVOR (GLOVE) IMPLANT
GOWN STRL REUS W/ TWL LRG LVL3 (GOWN DISPOSABLE) ×3 IMPLANT
GOWN STRL REUS W/ TWL XL LVL3 (GOWN DISPOSABLE) IMPLANT
HOOK RETRACT STAY BLUNT 12 (MISCELLANEOUS) IMPLANT
IV CATH 18G SAFETY (IV SOLUTION) ×1 IMPLANT
KIT NEURO ACCESSORY W/WRENCH (MISCELLANEOUS) IMPLANT
LEAD SENSING RESP INSPIRE (Lead) ×1 IMPLANT
LEAD SENSING RESP INSPIRE IV (Lead) ×1 IMPLANT
LEAD SLEEP STIM INSPIRE IV/V (Lead) ×1 IMPLANT
LEAD SLEEP STIMULATION INSPIRE (Lead) ×1 IMPLANT
LOOP VASCLR MAXI BLUE 18IN ST (MISCELLANEOUS) ×1 IMPLANT
LOOP VESSEL MINI RED (MISCELLANEOUS) ×1 IMPLANT
LOOPS VASCLR MAXI BLUE 18IN ST (MISCELLANEOUS) ×1 IMPLANT
MARKER SKIN DUAL TIP RULER LAB (MISCELLANEOUS) ×1 IMPLANT
NDL HYPO 25X1 1.5 SAFETY (NEEDLE) ×1 IMPLANT
NEEDLE HYPO 25X1 1.5 SAFETY (NEEDLE) ×1 IMPLANT
NS IRRIG 1000ML POUR BTL (IV SOLUTION) ×1 IMPLANT
PACK BASIN DAY SURGERY FS (CUSTOM PROCEDURE TRAY) ×1 IMPLANT
PACK ENT DAY SURGERY (CUSTOM PROCEDURE TRAY) ×1 IMPLANT
PASSER CATH 36 CODMAN DISP (NEUROSURGERY SUPPLIES) IMPLANT
PASSER CATH 38CM DISP (INSTRUMENTS) IMPLANT
PENCIL SMOKE EVACUATOR (MISCELLANEOUS) ×1 IMPLANT
PROBE NERVE STIMULATOR (NEUROSURGERY SUPPLIES) ×1 IMPLANT
REMOTE CONTROL SLEEP INSPIRE (MISCELLANEOUS) ×1 IMPLANT
SET WALTER ACTIVATION W/DRAPE (SET/KITS/TRAYS/PACK) ×1 IMPLANT
SLEEVE SCD COMPRESS KNEE MED (STOCKING) ×1 IMPLANT
SPONGE INTESTINAL PEANUT (DISPOSABLE) ×1 IMPLANT
SUT MNCRL AB 4-0 PS2 18 (SUTURE) ×1 IMPLANT
SUT MON AB 5-0 PS2 18 (SUTURE) IMPLANT
SUT PLAIN GUT FAST 5-0 (SUTURE) ×1 IMPLANT
SUT SILK 2 0 SH (SUTURE) IMPLANT
SUT SILK 2 0 SH CR/8 (SUTURE) ×1 IMPLANT
SUT SILK 3 0 RB1 (SUTURE) ×1 IMPLANT
SUT SILK 3 0 REEL (SUTURE) IMPLANT
SUT VIC AB 3-0 SH 27X BRD (SUTURE) IMPLANT
SUT VIC AB 4-0 PS2 27 (SUTURE) IMPLANT
SUT VIC AB 4-0 SH 18 (SUTURE) IMPLANT
SUT VICRYL 3-0 CR8 SH (SUTURE) ×1 IMPLANT
SUTURE SILK 3-0 RB1 30XBRD (SUTURE) IMPLANT
SYR 10ML LL (SYRINGE) ×2 IMPLANT
SYR BULB EAR ULCER 3OZ GRN STR (SYRINGE) ×1 IMPLANT
TOWEL GREEN STERILE FF (TOWEL DISPOSABLE) ×2 IMPLANT

## 2024-04-28 NOTE — Anesthesia Postprocedure Evaluation (Signed)
 Anesthesia Post Note  Patient: AIME CARRERAS  Procedure(s) Performed: INSERTION HYPOGLOSSAL NERVE STIMULATOR (Right: Neck)     Patient location during evaluation: PACU Anesthesia Type: General Level of consciousness: awake and alert Pain management: pain level controlled Vital Signs Assessment: post-procedure vital signs reviewed and stable Respiratory status: spontaneous breathing, nonlabored ventilation and respiratory function stable Cardiovascular status: blood pressure returned to baseline and stable Postop Assessment: no apparent nausea or vomiting Anesthetic complications: no  No notable events documented.  Last Vitals:  Vitals:   04/28/24 1400 04/28/24 1421  BP: 119/64 129/71  Pulse: 89 78  Resp: (!) 26 16  Temp:  (!) 36.3 C  SpO2: 93% 97%    Last Pain:  Vitals:   04/28/24 1421  TempSrc:   PainSc: 4                  Diasia Henken,W. EDMOND

## 2024-04-28 NOTE — Discharge Instructions (Addendum)
 Post Anesthesia Guidelines  During recovery from anesthesia  You may feel drowsy and reflexes may be slowed for 24 hours:  -Do not drive, use machinery, appliances, ride bicycles or scooters  -Do not consume alcohol  -Do not make important decisions   Eating and drinking:  -Drink plenty of liquids today  -Avoid fried or spicy foods today  -Return to your regular diet slowly over the next 24 hours  -Please call if unable to keep fluids down  If your throat is sore: -A breathing tube may have been used during your surgery -Drink cool liquids or gargle with warm salt water  -If soreness lasts more than a few days, please call us   Surgery Discharge Instructions:  Call clinic or return to ED if you: - develop a fever greater than 101.4 - have shaking chills or are feeling ill - become short of breath - have uncontrollable nausea or vomiting - can't hold down food or liquids or feel as though you are getting dehydrated - have significant leakage or drainage from wound - urine output of less than 30cc/hr for 12 hours - develop significant redness, pain at incision(s) or wound opens up/separates - any other acute events, problems, or concerns  Wound Care/Dressings/Drain Instructions:  - To take care of your incision/cuts on neck and chest - Gently take big dressing off (tape and gauze) on neck and chest tomorrow (day after surgery) - Your incision is closed with skin glue. It will peel off on its own in 1-2 weeks. You can let water  run over it in 48 hours like in a shower and gently pat dry. Avoid rubbing the incision or submerging under like like a bath or swimming pool. - Your stitches are absorbable and will dissolve on their own  - Starting in 72 hours, do gentle neck rolls (5 rolls twice per day) to prevent any tethering of the neck  Medications: - Resume your regular home medications except as detailed in the medication reconciliation.  - For pain, take tylenol  1000mg  every 6  hours. Avoid NSAIDs like ibuprofen  or aleeve or motrin . If that is not sufficient, a stronger pain medication has been prescribed to you (oxycodone  5mg  tablet every 4-6 hours). Do not mix with any other narcotic medication. - To prevent infection, we have prescribed you an antibiotic - take 500mg  of Keflex  twice daily for 5 days  Follow Up:  - A follow up appointment should be scheduled for you after discharge. However, If you do not hear about your appoinment in 3 business days, please call the provided surgery clinic number and confirm/schedule your appointment.    Activity/Restrictions:  - Resume your regular activities, as tolerated. Avoid heavy lifting or straining (more than 5 lbs) for 10 days.  Diet: - Resume your regular diet, as tolerated  Additional Instructions: - Please take an over the counter stool softener while taking narcotic pain medication - DO NOT MIX NARCOTIC PAIN MEDICATIONS OR TAKE NARCOTIC PRESCRIPTIONS AT THE SAME TIME - DO NOT DRIVE OR OPERATE HEAVY MACHINERY WHILE ON NARCOTICS  - DO NOT TAKE MORE THAN 4 GRAMS (4000mg ) OF TYLENOL  (ACETAMINOPHEN ) IN 24 HOURS  Department of Otolaryngology Contact Info: Otolaryngology Front Desk Phone including questions about appointments or questions: [phone] - If after normal business hours (Monday-Friday after 5PM or Weekends/Holidays), please call same number and follow prompts for Patient Access Line. There is a physician on call for urgent matters. For life threatening emergencies, please call 911   Post Anesthesia Home Care  Instructions  Activity: Get plenty of rest for the remainder of the day. A responsible individual must stay with you for 24 hours following the procedure.  For the next 24 hours, DO NOT: -Drive a car -Advertising copywriter -Drink alcoholic beverages -Take any medication unless instructed by your physician -Make any legal decisions or sign important papers.  Meals: Start with liquid foods such as  gelatin or soup. Progress to regular foods as tolerated. Avoid greasy, spicy, heavy foods. If nausea and/or vomiting occur, drink only clear liquids until the nausea and/or vomiting subsides. Call your physician if vomiting continues.  Special Instructions/Symptoms: Your throat may feel dry or sore from the anesthesia or the breathing tube placed in your throat during surgery. If this causes discomfort, gargle with warm salt water . The discomfort should disappear within 24 hours.  If you had a scopolamine  patch placed behind your ear for the management of post- operative nausea and/or vomiting:  1. The medication in the patch is effective for 72 hours, after which it should be removed.  Wrap patch in a tissue and discard in the trash. Wash hands thoroughly with soap and water . 2. You may remove the patch earlier than 72 hours if you experience unpleasant side effects which may include dry mouth, dizziness or visual disturbances. 3. Avoid touching the patch. Wash your hands with soap and water  after contact with the patch.    Last Tylenol  given at 8:06am today

## 2024-04-28 NOTE — Transfer of Care (Signed)
 Immediate Anesthesia Transfer of Care Note  Patient: Bethany [name]  Procedure(s) Performed: INSERTION HYPOGLOSSAL NERVE STIMULATOR (Right: Neck)  Patient Location: PACU  Anesthesia Type:General  Level of Consciousness: awake  Airway & Oxygen Therapy: Patient Spontanous Breathing and Patient connected to nasal cannula oxygen  Post-op Assessment: Report given to RN and Post -op Vital signs reviewed and stable  Post vital signs: Reviewed and stable  Last Vitals:  Vitals Value Taken Time  BP 148/76 04/28/24 12:45  Temp 36.8 C 04/28/24 12:45  Pulse 85 04/28/24 12:46  Resp 10 04/28/24 12:46  SpO2 95 % 04/28/24 12:46  Vitals shown include unfiled device data.  Last Pain:  Vitals:   04/28/24 0801  TempSrc: Temporal  PainSc: 0-No pain      Patients Stated Pain Goal: 3 (04/28/24 0801)  Complications: No notable events documented.

## 2024-04-28 NOTE — Telephone Encounter (Signed)
 Re-sent oxy to [facility] since other pharmacy did not have the medication Bethany [name] Bethany [name]

## 2024-04-28 NOTE — Op Note (Addendum)
 Otolaryngology Operative note  Bethany [name] Date/Time of Admission: 04/28/2024  7:29 AM  CSN: [id number];MRN:[id number]  DOB: 11-Oct-1956 Age: 68 y.o. Location: [facility]    Pre-Op Diagnosis: Moderate to Severe Obstructive Sleep Apnea (ICD-10 G47.33) Intolerance of Continuous Positive Airway Pressure Ventilation  Post-Op Diagnosis: Same  Procedure: RIGHT 12th cranial nerve (hypoglossal) stimulation implant with placement of chest wall respiratory sensor (CPT [phone]).   Surgeon: Eldora Blanch, MD  Anesthesia type:  General  Anesthesiologist: Anesthesiologist: Epifanio Fallow, MD CRNA: Franchot Izetta ORN, CRNA; Debarah Chiquita LABOR, CRNA   Staff: Circulator: Elaine Avelina PARAS, RN Scrub Person: Alto Charmaine PARAS Vendor Representative : Celena Rollo Candida Damien RN First Assistant: Bridgett Waddell PARAS, RN  Incision time: 10:15 AM Prep time: 10:05 AM Procedure finish: 12:10 PM  Implants: Implant Name Type Inv. Item Serial No. Manufacturer Lot No. LRB No. Used Action  GENERATOR PULSE INSPIRE - [id number] Generator GENERATOR PULSE INSPIRE [id number] [employer]  Right 1 Implanted  LEAD SLEEP STIMULATION INSPIRE - [id number] Lead LEAD SLEEP STIMULATION INSPIRE [id number] [employer]  Right 1 Implanted  LEAD SENSING RESP INSPIRE - [id number] Lead LEAD SENSING RESP INSPIRE [id number] [employer]  Right 1 Implanted    Specimens: None  EBL: 25cc  Drains: None  Post-op disposition and condition: PACU, hemodynamically stable  Findings: Normal neck and chest anatomy with successful implantation of hypoglossal stimulator. Diagnostic evaluation confirmed an appropriate respiration sensing signal as well as activation of the genioglossus nerve, resulting in genioglossal activation and tongue protrusion, confirmed visually.   Complications: None apparent  Indications and consent:  Bethany [name] is a 68 y.o. female with  history of moderate to severe obstructive sleep apnea with a BMI of 18 and intolerance of continuous positive airway pressure ventilation therapy. Patient has passed the clinical, polysomnographic and endoscopic screening criteria for implantation. The patient's options were discussed, including risks/benefits/alternatives for each option. Patient expressed understanding, and despite these risks, consented and decided to proceed with above procedures. Informed consent was signed before proceeding.   Procedure: The patient was brought to the Operating Room and was anesthetized via general endotracheal anesthesia without complication.  A shoulder roll was placed and the patient was prepped and draped in usual sterile fashion with the head turned to the left. Prior to prepping and draping, electrodes were placed in the genioglossus and hyoglossus muscle and connected to the NIM box for intraoperative nerve monitoring. Intraoperative antibiotics and steroids were administered.   1% lidocaine  with 1:100000 epinephrine  was injected in the planned and marked submandibular and chest incisions.  The patient was then prepped and draped in the standard fashion for this procedure.     A modified sub-mandibular incision was made in the right upper neck halfway between the hyoid bone and the inferior border of the mandible, about 1cm lateral from midline and about 5cm in length. Dissection was carried down through the subcutaneous tissue and platysma. The inferior border of the submandibular gland was identified as well as the digastric tendon. The submandibular gland and the overlying fascia with the marginal mandibular nerve were retracted superiorly and laterally.  The digastric tendon was retracted inferiorly using two vessel loops. Dissection was carried down under the mylohyoid muscle where the hypoglossal nerve was identified in its usual fashion.    The mylohyoid muscle was retracted anteriorly, and the  hypoglossal nerve was dissected medially. The lateral branches to retrusor muscles were identified, and tested intra-operatively using the bipolar  NIM stimulator. The superior/posterior branches innervating the hyoglossus muscle were identified using the NIM stimulator and anatomical cues.  The cuff electrode for the hypoglossal nerve stimulator was placed distally to these branches innervating the genioglossus, transverse, and vertical muscles. C1 branch was included. The stimulation lead was anchored to the digastric tendon using two 3-0 silk sutures. The wound bed was then irrigated with antibiotic irrigation. The lead body slack between the cuff and the anchor was gently tucked deep to the submandibular gland.   A second 5 cm incision was made in the right upper chest over the second intercostal space, approximately 3cm lateral to the sternal margin. Dissection was carried down through the skin and subcutaneous tissue to the fascia of the pectoralis muscle. An inferior pocket for the generator was created deep to the subcutaneous layer and superficial to the fascia of the pectoralis muscle.  The pectoralis major fascia was dissected directly over the second intercostal space with subsequent blunt dissection through the muscle.  The pectoralis major and minor were then retracted to expose the fatty layer just superficial to the external intercostal muscles.  The fatty layer was carefully swept away to expose the external intercostal muscles. A throw-down base knot was placed to the fascia of the external intercostals just lateral to the anterior external membrane using 3-0 silk suture.  The external intercostals were gently dissected, approximately 5 mm lateral to the suture knot and the respiratory sense lead was advanced with the sensor facing the pleura into the interfascial plane between the external and internal intercostals. The primary anchor was sutured into place with 3-0 silk on the external  intercostals.  The secondary anchor was sutured with 3-0 silk to the pectoralis major with some slack between the anchors.   The stimulation lead was then tunneled in a subplatysmal plane with blunt dissection under direct visualization and brought out into the sub-clavicular pocket where both the stimulation lead and the respiratory sensing lead were connected to the implantable pulse generator.   The implantable pulse generator was placed in the subclavicular pocket, ensuring the lead body was deep to the generator and secured with use of air knots to the pectoralis fascia using 2-0 silk sutures.  Diagnostic evaluation confirmed good placement of the stimulation cuff as demonstrated by activation of the genioglossus and transverse and vertical muscles, resulting in tongue protrusion, confirmed visually. Diagnostic evaluation also confirmed good respiratory sensor placement as demonstrated by a sensing waveform with rise and fall associated with patient respirations.   The wound bed was then irrigated with antibiotic irrigation and hemostasis was noted adequate.   The chest and neck wounds were then closed in three layers with deep 3-0 and 4-0 vicryl sutures and a 5-0 subcuticular monocryl followed by dermabond. A pressure dressing was applied to the neck (sponge and tape).The patient was then awakened, extubated, and transferred to Recovery Room in stable condition. Post-operative xrays were obtained in the recovery room  Eldora KATHEE Blanch

## 2024-04-28 NOTE — H&P (Signed)
 Pre-Operative H&P - Day Of Surgery Patient Name: Bethany [name] Date:   04/28/2024  HPI: Bethany [name] is a 68 y.o. female who presents today for operative treatment of obstructive sleep apnea, intolerance of continuous positive airway pressure ventilation. Patient denies recent significant changes to health or significant new medications or physiologic change in condition which would immediately impact plans. She had a mild URI over two weeks prior but reports full recovery for past two weeks. No new types of therapy has been initiated that would change the plan or the appropriateness of the plan.   ROS:  A complete review of systems was obtained and is otherwise negative.   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 All Paps normal afterwards   Degenerative disc disease, lumbar 05/02/2020   Generalized anxiety disorder    Herpes genitalis    Herpes labialis    History of calcium pyrophosphate deposition disease (CPPD) 03/26/2020   History of opiate abuse, in remission    History of small bowel obstruction 04/17/2018   partial   Hyperlipidemia with target LDL less than 100 06/17/2011   IBS (irritable bowel syndrome)    Lumbago with sciatica, right side 02/10/2021   Major depressive disorder    Meibomian gland dysfunction (MGD) of both eyes 08/02/2020   Narcotic bowel syndrome (HCC)    Osteopenia 04/2014   T score -1.7 FRAX 7.3%/0.8% stable from prior DEXA 2013   Raynaud's disease 06/17/2011   Recovering alcoholic in remission     S/P LASIK (laser assisted in situ keratomileusis) 08/02/2020   right eye   Sleep apnea    Spondylolisthesis of lumbar region 02/10/2021   Status post total right knee  replacement 10/31/2019   Tubular adenoma of colon 04/21/2016    PSH:  Past Surgical History:  Procedure Laterality Date   AUGMENTATION MAMMAPLASTY     BASAL CELL CARCINOMA EXCISION  12/24/2023   left superior temple shave   BREAST ENHANCEMENT SURGERY  2006   CATARACT EXTRACTION Bilateral    Jan/Feb 2024   COSMETIC SURGERY     DRUG INDUCED ENDOSCOPY N/A 12/20/2023   Procedure: DRUG INDUCED ENDOSCOPY;  Surgeon: Tobie Bethany NOVAK, MD;  Location: [facility];  Service: ENT;  Laterality: N/A;   EYE SURGERY     lasik od   GYNECOLOGIC CRYOSURGERY  1990   LAPAROTOMY N/A 04/22/2018   Procedure: EXPLORATORY LAPAROTOMY;  Surgeon: Vanderbilt Ned, MD;  Location: [facility];  Service: General;  Laterality: N/A;   LAPAROTOMY N/A 05/14/2023   Procedure: EXPLORATORY LAPAROTOMY;  Surgeon: Vanderbilt Ned, MD;  Location: [facility];  Service: General;  Laterality: N/A;   LIGAMENT REPAIR Right 09/24/2015   Procedure: RIGHT INDEX METACARPAL PHALANGEAL RADIAL COLLATERAL LIGAMENT REPAIR;  Surgeon: Franky Curia, MD;  Location: [facility];  Service: Orthopedics;  Laterality: Right;   LYSIS OF ADHESION N/A 05/14/2023   Procedure: LYSIS OF ADHESION, REPAIR OF SMALL BOWEL SEROSAL TEAR x 3 WITH FULL-THICKNESS REPAIR x 1;  Surgeon: Vanderbilt Ned, MD;  Location: [facility];  Service: General;  Laterality: N/A;   MASS EXCISION N/A 08/04/2018   Procedure: REMOVAL OF SUTURE FROM ABDOMEN/ERAS PATHWAY;  Surgeon: Vanderbilt Ned, MD;  Location: [facility];  Service: General;  Laterality: N/A;   OOPHORECTOMY     BSO   Right knee arthoscopy Right 09/09/2017   [facility]   SHX 1 Left 02/19/2020   basel cell carcinoma   SHX1 Right 12/11/2020   basal cell carinoma,micronodular pattern   SKIN BIOPSY Right 02/28/2019   Superficial basal carcinoma    SKIN BIOPSY Left 11/24/2021   superficial basal carcinoma of the left forearm   TONSILLECTOMY AND ADENOIDECTOMY     TOTAL KNEE  ARTHROPLASTY Right 04/12/2018   TOTAL KNEE ARTHROPLASTY Right 04/12/2018   Procedure: RIGHT TOTAL KNEE ARTHROPLASTY;  Surgeon: Sheril Coy, MD;  Location: [facility];  Service: Orthopedics;  Laterality: Right;   TRANSFORAMINAL LUMBAR INTERBODY FUSION W/ MIS 1 LEVEL N/A 09/03/2021   Procedure: Lumbar four-five Minimally invasive transforaminal lumbar interbody fusion;  Surgeon: Dawley, Lani BROCKS, DO;  Location: [facility];  Service: Neurosurgery;  Laterality: N/A;   TUBAL LIGATION     UPPER GASTROINTESTINAL ENDOSCOPY  04/24/2005   VAGINAL HYSTERECTOMY  2005   [facility] BSO  adenomyosis    MEDS:   Current Facility-Administered Medications:    lactated ringers  infusion, , Intravenous, Continuous, [name], Bethany LABOR, MD  ALLERGIES: Buprenorphine hcl and Morphine  and codeine  EXAM: Vitals: Ht 5' 5 (1.651 m)   Wt 52.2 kg   BMI 19.14 kg/m   General Awake, at baseline alertness.   HEENT No scleral icterus or conjunctival hemorrhage. Globe position appears normal. External ears  normal. Nose patent without rhinorrhea. No lymphadenopathy. No thyromegaly  Cardiovascular No cyanosis.  Pulmonary No audible stridor. Breathing easily with no labor.  Neuro Symmetric facial movement.   Psychiatry Appropriate affect and mood.  Skin No scars or lesions on face or neck.  Extermities Moves all extremities with normal range of motion.   Other Findings None.   Assessment & Plan: Bethany [name] has diagnoses of obstructive sleep apnea, intolerance of continuous positive airway pressure ventilation and will go to the OR today for right hypoglossal nerve stimulator placement. Informed consent was obtained and available in EMR today. All questions have been answered, and risks/benefits/alternatives of procedure as noted in the consent were discussed in a quiet area. Questions were invited and answered. The patient expressed understanding, provided consent and wished to proceed despite risks.  We had a discussion regarding risks  of Hypoglossal Nerve Stimulator Placement including lack of benefit, persistent symptoms, pneumothorax, tongue soreness or weakness or paralysis, Floor of mouth numbness, injury to major vessels, hematoma, implant infection requiring explant, bleeding, scarring, tethering of neck, persistent symptoms, need for further procedures, and risk of anesthesia among others.    Bethany [name] 04/28/2024 7:43 AM

## 2024-04-28 NOTE — Anesthesia Procedure Notes (Signed)
 Procedure Name: Intubation Date/Time: 04/28/2024 9:47 AM  Performed by: Franchot Izetta ORN, CRNAPre-anesthesia Checklist: Patient identified, Emergency Drugs available, Suction available and Patient being monitored Patient Re-evaluated:Patient Re-evaluated prior to induction Oxygen Delivery Method: Circle system utilized Preoxygenation: Pre-oxygenation with 100% oxygen Induction Type: IV induction Ventilation: Mask ventilation without difficulty Laryngoscope Size: Miller and 2 Grade View: Grade I Tube type: Oral Tube size: 7.0 mm Number of attempts: 1 Airway Equipment and Method: Stylet Placement Confirmation: ETT inserted through vocal cords under direct vision, positive ETCO2 and breath sounds checked- equal and bilateral Secured at: 21 cm Tube secured with: Tape Dental Injury: Teeth and Oropharynx as per pre-operative assessment

## 2024-04-28 NOTE — Anesthesia Preprocedure Evaluation (Addendum)
 Anesthesia Evaluation  Patient identified by MRN, date of birth, ID band Patient awake    Reviewed: Allergy & Precautions, H&P , NPO status , Patient's Chart, lab work & pertinent test results  Airway Mallampati: III  TM Distance: >3 FB Neck ROM: Full    Dental no notable dental hx. (+) Teeth Intact, Dental Advisory Given   Pulmonary sleep apnea , former smoker   Pulmonary exam normal breath sounds clear to auscultation       Cardiovascular negative cardio ROS  Rhythm:Regular Rate:Normal     Neuro/Psych   Anxiety Depression    negative neurological ROS     GI/Hepatic negative GI ROS, Neg liver ROS,,,  Endo/Other  negative endocrine ROS    Renal/GU negative Renal ROS  negative genitourinary   Musculoskeletal  (+) Arthritis ,    Abdominal   Peds  Hematology negative hematology ROS (+)   Anesthesia Other Findings   Reproductive/Obstetrics negative OB ROS                             Anesthesia Physical Anesthesia Plan  ASA: 3  Anesthesia Plan: General   Post-op Pain Management: Tylenol  PO (pre-op)*   Induction: Intravenous  PONV Risk Score and Plan: 4 or greater and Ondansetron  and Dexamethasone   Airway Management Planned: Oral ETT  Additional Equipment:   Intra-op Plan:   Post-operative Plan: Extubation in OR  Informed Consent: I have reviewed the patients History and Physical, chart, labs and discussed the procedure including the risks, benefits and alternatives for the proposed anesthesia with the patient or authorized representative who has indicated his/her understanding and acceptance.     Dental advisory given  Plan Discussed with: CRNA  Anesthesia Plan Comments:        Anesthesia Quick Evaluation

## 2024-04-30 ENCOUNTER — Encounter (HOSPITAL_BASED_OUTPATIENT_CLINIC_OR_DEPARTMENT_OTHER): Payer: Self-pay | Admitting: Otolaryngology

## 2024-05-02 ENCOUNTER — Ambulatory Visit: Admitting: Gastroenterology

## 2024-05-04 DIAGNOSIS — M25561 Pain in right knee: Secondary | ICD-10-CM | POA: Diagnosis not present

## 2024-05-07 NOTE — Progress Notes (Unsigned)
 No chief complaint on file.  Bethany [name] is a 68 y.o. female who presents for annual wellness visit and follow-up on chronic medical conditions.    OSA:  She had trouble tolerating CPAP. She underwent RIGHT 12th cranial nerve (hypoglossal) stimulation implant with placement of chest wall respiratory sensor by Dr. Tobie on 04/28/24 Earl).   Hyperlipidemia follow-up:  Patient is reportedly following a low-fat, low cholesterol diet.  Compliant with medications (20 mg simvastatin ) and denies medication side effects. She denies changes to her diet.  She is due for recheck of labs.  Lab Results  Component Value Date   CHOL 220 (H) 04/29/2023   HDL 102 04/29/2023   LDLCALC 106 (H) 04/29/2023   TRIG 74 05/17/2023   CHOLHDL 2.2 04/29/2023   H/o IBS and SBOs.  She is under the care of Dr. Shila.  She last saw her in January, and is scheduled for follow-up in August. Her current management includes Movantik , Dulcolax (5 mg at bedtime) *** if still taking??,  and MiraLAX  (half capful twice a day). She uses lomotil  prn (***rarely needs?)  With a history of multiple bowel obstructions, she is cautious about her diet, avoiding nuts, seeds, and raw vegetables, and prefers smoothies. Occasional bloating and gas occur.   She has chronic pain.  She had a shoulder injury, for which she got PT. ***  Back pain--She had RF ablation by Dr. Eudelia in 10/2023. She takes gabapentin  and tizanidine . *** She is also under the care of Dr. Claudene, whom she saw for worsening back pain in May.  She had MRI last month, and plan is for epidural injection. IMPRESSION: 1. Interval lumbar fusion at L4-5 with right laminectomy. The central canal and foramina are patent at this level. Anterolisthesis is reduced. 2. Progressive adjacent level rightward disc protrusion and asymmetric right-sided facet hypertrophy at L3-4 with mild right foraminal narrowing. 3. Progressive adjacent level anterolisthesis at  L5-S1 with uncovering of a central disc protrusion. The central canal is patent. Mild left foraminal narrowing is new. 4. Mild facet hypertrophy bilaterally at L2-3 without stenosis.   Osteopenia: last DEXA was in 06/2023.  T-1.3 at spine, -1.7 at R fem neck, -1.4 at L fem neck.  This is stable from 09/2020. She gets weight-bearing exercise. She takes vitamin D , but no calcium.  ***UPDATE CALCIUM  Vitamin D  level was 65.8 in 04/2023 (when taking 1000 IU daily).  She has h/o elevated level at 107.37 in 11/2020. She is currently taking ***   She is under the care of psychiatrist for anxiety, depression and ADHD (initially started seeing psych for her issues with substance abuse).    Immunization History  Administered Date(s) Administered   Fluad Quad(high Dose 65+) 08/20/2022   Fluad Trivalent(High Dose 65+) 08/23/2023   Influenza Split 08/20/2015   Influenza Whole 07/02/2010, 07/25/2011   Influenza,inj,Quad PF,6+ Mos 09/07/2013, 09/10/2014, 08/20/2016, 08/04/2017, 07/28/2018, 08/16/2019, 08/28/2020, 07/21/2021   PFIZER(Purple Top)SARS-COV-2 Vaccination 01/25/2020, 02/15/2020, 08/28/2020   Pfizer Covid-19 Vaccine Bivalent Booster 59yrs & up 08/25/2021   Pneumococcal Conjugate-13 12/10/2020   Pneumococcal Polysaccharide-23 04/23/2022   Tdap 04/27/2008, 09/13/2018   Zoster Recombinant(Shingrix ) 08/04/2017, 09/13/2018   Zoster, Live 01/07/2016   Last Pap smear: N/A. S/p TVH-BSO in 2005 for adenomyosis. Under the care of GYN Last mammogram: 11/2023 (at [facility]) Last colonoscopy: 06/2022, tubular adenoma, 7 yr f/u rec Last DEXA: 06/2023.  T-1.3 at spine, -1.7 at R fem neck, -1.4 at L fem neck.  This is stable from 09/2020. Dentist:  twice yearly Ophtho: yearly  Exercise:   Daily--pickleball 5 days/week, walking, gym 3x/week (less during the summer)--classes (body pump), weights, elliptical.   Patient Care Team: Randol Dawes, MD as PCP - General (Family Medicine) GI: Dr. Shila Psych:  Dr. Gaither Book Sports Med: Dr. Claudene Plastic surgery: Dr. Lowery (had consult 01/2023) GYN: Dr. Rockney (retired, no longer sees). Ophtho: [facility] [place]; Dr. Meridee did cataract surgery Dentist: Dr. Oneil [place] Audiologist: Darryle Posey (seen 11/2022 for eval for tinnitus, hearing loss) Neurosurgeon: Dr. Carollee Neuro: Camie Sevin, PA Ortho: Dr. Sheril Derm: Dr. Bard Molt ENT: Dr. Tobie PMR: Dr. Eudelia Collier  Sleep Med: Dr. Harl (at [facility])   Depression Screening:    10/19/2023   12:44 PM 10/05/2023   11:30 AM 08/03/2023   10:38 AM 06/02/2023    9:35 AM 04/28/2023    1:33 PM  Depression screen PHQ 2/9  Decreased Interest 1 1 0 3 0  Down, Depressed, Hopeless 1 1 0 3 0  PHQ - 2 Score 2 2 0 6 0  Altered sleeping   0 3 0  Tired, decreased energy   0 3 2  Change in appetite   0 3 0  Feeling bad or failure about yourself    0 3 0  Trouble concentrating   1 3 1   Moving slowly or fidgety/restless   0 0 0  Suicidal thoughts   0 0 0  PHQ-9 Score   1 21 3   Difficult doing work/chores    Very difficult Not difficult at all    Falls screen:     10/19/2023   12:43 PM 10/05/2023   11:30 AM 08/03/2023   10:37 AM 04/28/2023    1:33 PM 04/23/2022    1:49 PM  Fall Risk   Falls in the past year? 1 0 1 1 1   Number falls in past yr: 1 0 1 1 1   Comment fell in September on pickle ball court  last fall mid september on pickle ball court    Injury with Fall? 0 0 0 0 0  Risk for fall due to :   History of fall(s);Orthopedic patient History of fall(s) Impaired balance/gait  Follow up    Falls evaluation completed Falls evaluation completed      Data saved with a previous flowsheet row definition    Falls related to playing pickleball.  Shoulder injury  Functional Status Survey:        End of Life Discussion:  Patient has a living will and medical power of attorney, not in chart.   PMH, PSH, SH and FH were reviewed and updated     ROS:  Patient denies  anorexia, fever, weight changes, headaches, URI symptoms, chest pain, palpitations, dizziness, syncope, dyspnea on exertion, cough, swelling, nausea, vomiting, diarrhea, abdominal pain, melena, hematochezia, indigestion/heartburn, hematuria, incontinence, dysuria, vaginal bleeding, discharge, odor or itch, genital lesions, numbness, tingling, weakness, tremor, suspicious skin lesions, dabnormal bleeding/bruising, or enlarged lymph nodes.  Hearing loss, tinnitus bilaterally Some ongoing vision problems since cataract surgery. +chronic constipation, controlled with current regimen.  Chronic back pain, and R knee pain. *** Shoulder ***  Moods are good, some anxiety. Denies depression. Sleeping very well on current medication regimen. Unrefreshed sleep, daytime somnolence and snoring per HPI. ***  Some easy bruising. Some pain at L breast, related to implant (chronic; at one point saw plastic surgery for consult for an exchange, too expensive.)    PHYSICAL EXAM:  There were no vitals taken for this  visit.  Wt Readings from Last 3 Encounters:  04/28/24 110 lb 7.2 oz (50.1 kg)  04/26/24 113 lb (51.3 kg)  04/13/24 112 lb (50.8 kg)         ASSESSMENT/PLAN:   Verify doctors and enter them in patient care team in epic. This is AWV only, not a physical (has that 7/21). She does not need to get undressed   ?need for D level ***will need to REMOVE the DNR if MOST is the same as last time.  ?add back pain dx  Discussed monthly self breast exams and yearly mammograms; at least 30 minutes of aerobic activity at least 5 days/week and weight-bearing exercise 2x/week; proper sunscreen use reviewed; healthy diet, including goals of calcium and vitamin D  intake and alcohol recommendations (less than or equal to 1 drink/day) reviewed; regular seatbelt use; changing batteries in smoke detectors, use of carbon monoxide detectors.  Immunization recommendations discussed--continue yearly flu shots  (high dose). RSV from the pharmacy in the Fall. Updated COVID booster in the Fall, when available. Colonoscopy recommendations reviewed, UTD (due 2030). Consider repeat DEXA (now vs in 1-2 years)--prev ordered by Dr. Claudene and done at [facility], would want to have rechecked on same machine. She will address with him (he would need to order to get it done there)  MOST form completed, full code. She is okay with chest compressions and initial airway management. Doesn't want intubation prolonged ***UPDATE  Asked to get us  copies of living will and healthcare power of attorney   Medicare Attestation I have personally reviewed: The patient's medical and social history Their use of alcohol, tobacco or illicit drugs Their current medications and supplements The patient's functional ability including ADLs,fall risks, home safety risks, cognitive, and hearing and visual impairment Diet and physical activities Evidence for depression or mood disorders  The patient's weight, height, BMI have been recorded in the chart.  I have made referrals, counseling, and provided education to the patient based on review of the above and I have provided the patient with a written personalized care plan for preventive services.     Bethany DELENA Fetters, MD

## 2024-05-07 NOTE — Patient Instructions (Incomplete)
  Bethany [name] , Thank you for taking time to come for your Medicare Wellness Visit. I appreciate your ongoing commitment to your health goals. Please review the following plan we discussed and let me know if I can assist you in the future.   This is a list of the screening recommended for you and due dates:  Health Maintenance  Topic Date Due   COVID-19 Vaccine (5 - 2024-25 season) 07/04/2023   Flu Shot  06/02/2024   Mammogram  11/03/2024   Medicare Annual Wellness Visit  05/08/2025   DTaP/Tdap/Td vaccine (3 - Td or Tdap) 09/13/2028   Colon Cancer Screening  07/01/2029   Pneumococcal Vaccine for age over 23  Completed   DEXA scan (bone density measurement)  Completed   Hepatitis C Screening  Completed   Zoster (Shingles) Vaccine  Completed   Hepatitis B Vaccine  Aged Out   HPV Vaccine  Aged Out   Meningitis B Vaccine  Aged Out    Please bring us  copies of your Living Will and Healthcare Power of Attorney so that it can be scanned into your medical chart.  Continue yearly high dose flu shots, in the Fall. I recommend getting COVID booster--at this point, okay to wait until the updated version comes out around September.  Look at your calcium gummy to see if it is 500 mg per serving, and if a serving is 1 or 2 gummies. If each serving is 1 gummy (500 mg each), then you shouldn't take them at the same time, and take the second one only if needed (based on how much calcium you got in your diet that day). Your total intake of calcium should be 1200-1500 mg daily, from all sources (including all vitamins and diet).

## 2024-05-08 ENCOUNTER — Ambulatory Visit: Payer: Federal, State, Local not specified - PPO | Admitting: Family Medicine

## 2024-05-08 ENCOUNTER — Encounter: Payer: Self-pay | Admitting: Family Medicine

## 2024-05-08 VITALS — BP 124/72 | HR 59 | Ht 65.0 in | Wt 112.2 lb

## 2024-05-08 DIAGNOSIS — M85852 Other specified disorders of bone density and structure, left thigh: Secondary | ICD-10-CM

## 2024-05-08 DIAGNOSIS — H903 Sensorineural hearing loss, bilateral: Secondary | ICD-10-CM

## 2024-05-08 DIAGNOSIS — F1111 Opioid abuse, in remission: Secondary | ICD-10-CM

## 2024-05-08 DIAGNOSIS — Z0000 Encounter for general adult medical examination without abnormal findings: Secondary | ICD-10-CM

## 2024-05-08 DIAGNOSIS — Z5181 Encounter for therapeutic drug level monitoring: Secondary | ICD-10-CM

## 2024-05-08 DIAGNOSIS — M85851 Other specified disorders of bone density and structure, right thigh: Secondary | ICD-10-CM

## 2024-05-08 DIAGNOSIS — K589 Irritable bowel syndrome without diarrhea: Secondary | ICD-10-CM

## 2024-05-08 DIAGNOSIS — F325 Major depressive disorder, single episode, in full remission: Secondary | ICD-10-CM | POA: Diagnosis not present

## 2024-05-08 DIAGNOSIS — G4733 Obstructive sleep apnea (adult) (pediatric): Secondary | ICD-10-CM

## 2024-05-08 DIAGNOSIS — E7800 Pure hypercholesterolemia, unspecified: Secondary | ICD-10-CM

## 2024-05-08 DIAGNOSIS — M5416 Radiculopathy, lumbar region: Secondary | ICD-10-CM

## 2024-05-08 DIAGNOSIS — F1021 Alcohol dependence, in remission: Secondary | ICD-10-CM

## 2024-05-08 DIAGNOSIS — E785 Hyperlipidemia, unspecified: Secondary | ICD-10-CM

## 2024-05-08 DIAGNOSIS — Z8719 Personal history of other diseases of the digestive system: Secondary | ICD-10-CM

## 2024-05-09 ENCOUNTER — Telehealth (INDEPENDENT_AMBULATORY_CARE_PROVIDER_SITE_OTHER): Payer: Self-pay | Admitting: Otolaryngology

## 2024-05-09 ENCOUNTER — Encounter (INDEPENDENT_AMBULATORY_CARE_PROVIDER_SITE_OTHER): Payer: Self-pay

## 2024-05-09 ENCOUNTER — Ambulatory Visit: Payer: Self-pay | Admitting: Family Medicine

## 2024-05-09 ENCOUNTER — Telehealth: Payer: Self-pay

## 2024-05-09 DIAGNOSIS — E785 Hyperlipidemia, unspecified: Secondary | ICD-10-CM

## 2024-05-09 LAB — LIPID PANEL
Chol/HDL Ratio: 2.5 ratio (ref 0.0–4.4)
Cholesterol, Total: 217 mg/dL — ABNORMAL HIGH (ref 100–199)
HDL: 88 mg/dL (ref >39)
LDL Chol Calc (NIH): 117 mg/dL — ABNORMAL HIGH (ref 0–99)
Triglycerides: 71 mg/dL (ref 0–149)
VLDL Cholesterol Cal: 12 mg/dL (ref 5–40)

## 2024-05-09 LAB — CBC WITH DIFFERENTIAL/PLATELET
Basophils Absolute: 0.1 x10E3/uL (ref 0.0–0.2)
Basos: 2 %
EOS (ABSOLUTE): 0.2 x10E3/uL (ref 0.0–0.4)
Eos: 4 %
Hematocrit: 38.7 % (ref 34.0–46.6)
Hemoglobin: 12.7 g/dL (ref 11.1–15.9)
Immature Grans (Abs): 0 x10E3/uL (ref 0.0–0.1)
Immature Granulocytes: 0 %
Lymphocytes Absolute: 1.3 x10E3/uL (ref 0.7–3.1)
Lymphs: 32 %
MCH: 33.3 pg — ABNORMAL HIGH (ref 26.6–33.0)
MCHC: 32.8 g/dL (ref 31.5–35.7)
MCV: 102 fL — ABNORMAL HIGH (ref 79–97)
Monocytes Absolute: 0.3 x10E3/uL (ref 0.1–0.9)
Monocytes: 7 %
Neutrophils Absolute: 2.1 x10E3/uL (ref 1.4–7.0)
Neutrophils: 55 %
Platelets: 208 x10E3/uL (ref 150–450)
RBC: 3.81 x10E6/uL (ref 3.77–5.28)
RDW: 12.2 % (ref 11.7–15.4)
WBC: 3.9 x10E3/uL (ref 3.4–10.8)

## 2024-05-09 LAB — COMPREHENSIVE METABOLIC PANEL WITH GFR
ALT: 20 IU/L (ref 0–32)
AST: 35 IU/L (ref 0–40)
Albumin: 4.5 g/dL (ref 3.9–4.9)
Alkaline Phosphatase: 78 IU/L (ref 44–121)
BUN/Creatinine Ratio: 11 — ABNORMAL LOW (ref 12–28)
BUN: 11 mg/dL (ref 8–27)
Bilirubin Total: <0.2 mg/dL (ref 0.0–1.2)
CO2: 22 mmol/L (ref 20–29)
Calcium: 9.6 mg/dL (ref 8.7–10.3)
Chloride: 101 mmol/L (ref 96–106)
Creatinine, Ser: 0.96 mg/dL (ref 0.57–1.00)
Globulin, Total: 2.2 g/dL (ref 1.5–4.5)
Glucose: 81 mg/dL (ref 70–99)
Potassium: 5 mmol/L (ref 3.5–5.2)
Sodium: 142 mmol/L (ref 134–144)
Total Protein: 6.7 g/dL (ref 6.0–8.5)
eGFR: 64 mL/min/1.73 (ref >59)

## 2024-05-09 LAB — VITAMIN D 25 HYDROXY (VIT D DEFICIENCY, FRACTURES): Vit D, 25-Hydroxy: 87.3 ng/mL (ref 30.0–100.0)

## 2024-05-09 MED ORDER — SIMVASTATIN 20 MG PO TABS: ORAL_TABLET | ORAL | 3 refills | Status: AC

## 2024-05-09 NOTE — Telephone Encounter (Signed)
 Copied from CRM [id number]. Topic: Clinical - Lab/Test Results >> May 09, 2024  9:20 AM Graeme ORN wrote: Reason for CRM: Patient called to check on lab results. Let her know have not yet been reviewed by provider. Patient concerned about blood count and would like a call back with results once reviewed. Thank You

## 2024-05-09 NOTE — Telephone Encounter (Signed)
 Called patient per Jamina H's request to reschedule appointment currently on 05/12/2024 because Dr. Tobie will be in Surgery.  LVM & Sent MyChart msg requesting a call back.

## 2024-05-10 ENCOUNTER — Encounter (INDEPENDENT_AMBULATORY_CARE_PROVIDER_SITE_OTHER): Payer: Self-pay | Admitting: Otolaryngology

## 2024-05-10 ENCOUNTER — Ambulatory Visit (INDEPENDENT_AMBULATORY_CARE_PROVIDER_SITE_OTHER): Admitting: Otolaryngology

## 2024-05-10 VITALS — BP 113/71 | HR 69

## 2024-05-10 DIAGNOSIS — Z789 Other specified health status: Secondary | ICD-10-CM

## 2024-05-10 DIAGNOSIS — G4733 Obstructive sleep apnea (adult) (pediatric): Secondary | ICD-10-CM

## 2024-05-10 DIAGNOSIS — Z9889 Other specified postprocedural states: Secondary | ICD-10-CM

## 2024-05-10 NOTE — Progress Notes (Signed)
 S/p Hypoglossal nerve stimulator placement on 04/28/2024 S: Doing well, pain resolved; no fevers or issue with wound. No tongue weakness, no lip weakness. No SOB O: Tongue movement intact; no lower lip weakness; neck and chest incision c/d/I, no evidence of infection A/P: 68 y.o. y.o. w/ OSA s/p Hypoglossal nerve stimulator placement on 04/28/2024 Doing well, no evidence of infection Recommend continued gentle neck rolls F/u as needed

## 2024-05-12 ENCOUNTER — Encounter (INDEPENDENT_AMBULATORY_CARE_PROVIDER_SITE_OTHER): Payer: Medicare Other | Admitting: Otolaryngology

## 2024-05-15 NOTE — Discharge Instructions (Signed)

## 2024-05-16 ENCOUNTER — Ambulatory Visit
Admission: RE | Admit: 2024-05-16 | Discharge: 2024-05-16 | Disposition: A | Discharge status: Home / Self Care | Source: Ambulatory Visit | Attending: Family Medicine | Admitting: Family Medicine

## 2024-05-16 DIAGNOSIS — M4726 Other spondylosis with radiculopathy, lumbar region: Secondary | ICD-10-CM | POA: Diagnosis not present

## 2024-05-16 DIAGNOSIS — Z981 Arthrodesis status: Secondary | ICD-10-CM | POA: Diagnosis not present

## 2024-05-16 DIAGNOSIS — F411 Generalized anxiety disorder: Secondary | ICD-10-CM | POA: Diagnosis not present

## 2024-05-16 DIAGNOSIS — M5416 Radiculopathy, lumbar region: Secondary | ICD-10-CM

## 2024-05-16 MED ORDER — IOPAMIDOL (ISOVUE-M 200) INJECTION 41%
1.0000 mL | Freq: Once | INTRAMUSCULAR | Status: AC
  Administered 2024-05-16: 1 mL via EPIDURAL

## 2024-05-16 MED ORDER — METHYLPREDNISOLONE ACETATE 40 MG/ML INJ SUSP (RADIOLOG
80.0000 mg | Freq: Once | INTRAMUSCULAR | Status: AC
  Administered 2024-05-16: 80 mg via EPIDURAL

## 2024-05-18 ENCOUNTER — Other Ambulatory Visit: Payer: Self-pay | Admitting: Gastroenterology

## 2024-05-18 ENCOUNTER — Encounter: Payer: Self-pay | Admitting: Gastroenterology

## 2024-05-22 ENCOUNTER — Ambulatory Visit: Payer: Federal, State, Local not specified - PPO | Admitting: Family Medicine

## 2024-05-22 DIAGNOSIS — E7800 Pure hypercholesterolemia, unspecified: Secondary | ICD-10-CM

## 2024-05-22 DIAGNOSIS — Z0000 Encounter for general adult medical examination without abnormal findings: Secondary | ICD-10-CM

## 2024-05-22 DIAGNOSIS — M85851 Other specified disorders of bone density and structure, right thigh: Secondary | ICD-10-CM

## 2024-05-22 DIAGNOSIS — F1111 Opioid abuse, in remission: Secondary | ICD-10-CM

## 2024-05-22 DIAGNOSIS — K589 Irritable bowel syndrome without diarrhea: Secondary | ICD-10-CM

## 2024-05-22 DIAGNOSIS — Z8719 Personal history of other diseases of the digestive system: Secondary | ICD-10-CM

## 2024-05-22 DIAGNOSIS — F1021 Alcohol dependence, in remission: Secondary | ICD-10-CM

## 2024-05-22 DIAGNOSIS — F325 Major depressive disorder, single episode, in full remission: Secondary | ICD-10-CM

## 2024-05-22 DIAGNOSIS — M5416 Radiculopathy, lumbar region: Secondary | ICD-10-CM

## 2024-05-22 DIAGNOSIS — G4733 Obstructive sleep apnea (adult) (pediatric): Secondary | ICD-10-CM

## 2024-06-02 ENCOUNTER — Other Ambulatory Visit: Payer: Self-pay

## 2024-06-02 MED ORDER — RIFAXIMIN 550 MG PO TABS
550.0000 mg | ORAL_TABLET | Freq: Three times a day (TID) | ORAL | 0 refills | Status: AC
Start: 2024-06-02 — End: 2024-06-12

## 2024-06-04 DIAGNOSIS — M25561 Pain in right knee: Secondary | ICD-10-CM | POA: Diagnosis not present

## 2024-06-05 ENCOUNTER — Telehealth: Payer: Self-pay

## 2024-06-05 ENCOUNTER — Other Ambulatory Visit (HOSPITAL_COMMUNITY): Payer: Self-pay

## 2024-06-05 NOTE — Telephone Encounter (Signed)
 PA request has been Submitted. New Encounter has been or will be created for follow up. For additional info see Pharmacy Prior Auth telephone encounter from 06-05-2024.  Please keep in mind to send all requests to the RX PRIOR AUTH TEAM pool to prevent delays should I happen to be out of office or unavailable. Sending to the pool will allow others to see the request should I not be here. Thank you.

## 2024-06-05 NOTE — Telephone Encounter (Signed)
 Pharmacy Patient Advocate Encounter   Received notification from Patient Advice Request messages that prior authorization for Xifaxan  550MG  tablets is required/requested.   Insurance verification completed.   The patient is insured through [employer] [facility] .   Per test claim: PA required; PA submitted to above mentioned insurance via CoverMyMeds Key/confirmation #/EOC [id number] Status is pending

## 2024-06-06 NOTE — Telephone Encounter (Signed)
 Pharmacy Patient Advocate Encounter  Received notification from [employer] [facility] that Prior Authorization for Xifaxan  550MG  tablets has been DENIED.  Full denial letter will be uploaded to the media tab. See denial reason below.  The information submitted did not meet the plan's criteria for medical necessity due to the following reason:  The use of this medication without an inadequate treatment response, an intolerance, or a contraindication to another antibiotic for small intestinal bacterial overgrowth (SIBO) (e.g. amoxicillin -clavulanic acid, ciprofloxacin, metronidazole , etc.), AND without an inadequate treatment response to dietary modification (such as low carbohydrate diet, exclusion of gas producing foods, lactose free diet if intolerant) does not establish medical necessity for this drug.   PA #/Case ID/Reference #: [id number]

## 2024-06-07 DIAGNOSIS — G4733 Obstructive sleep apnea (adult) (pediatric): Secondary | ICD-10-CM | POA: Diagnosis not present

## 2024-06-07 DIAGNOSIS — Z4542 Encounter for adjustment and management of neuropacemaker (brain) (peripheral nerve) (spinal cord): Secondary | ICD-10-CM | POA: Diagnosis not present

## 2024-06-08 ENCOUNTER — Ambulatory Visit: Admitting: Family Medicine

## 2024-06-12 NOTE — Telephone Encounter (Signed)
 Patient has an appointment 06/14/24. Message sent to her about Xifaxan .

## 2024-06-12 NOTE — Telephone Encounter (Signed)
 Please schedule follow-up office visit with me soon, I will discuss with her during visit different options.  Thank you

## 2024-06-14 ENCOUNTER — Ambulatory Visit: Admitting: Gastroenterology

## 2024-06-14 ENCOUNTER — Encounter: Payer: Self-pay | Admitting: Gastroenterology

## 2024-06-14 VITALS — BP 124/62 | HR 65 | Ht 65.0 in | Wt 116.0 lb

## 2024-06-14 DIAGNOSIS — K913 Postprocedural intestinal obstruction, unspecified as to partial versus complete: Secondary | ICD-10-CM | POA: Diagnosis not present

## 2024-06-14 DIAGNOSIS — R140 Abdominal distension (gaseous): Secondary | ICD-10-CM | POA: Diagnosis not present

## 2024-06-14 DIAGNOSIS — K581 Irritable bowel syndrome with constipation: Secondary | ICD-10-CM | POA: Diagnosis not present

## 2024-06-14 MED ORDER — NALOXEGOL OXALATE 25 MG PO TABS: 25.0000 mg | ORAL_TABLET | Freq: Every day | ORAL | 3 refills | Status: DC

## 2024-06-14 NOTE — Patient Instructions (Addendum)
 VISIT SUMMARY:  Today, we discussed your ongoing issues with constipation and adjusted your treatment plan to help improve your bowel movements. We also reviewed your current medications and supplements, and made some changes to optimize your regimen.  YOUR PLAN:  CHRONIC CONSTIPATION: You have been experiencing constipation with decreased bowel movements and abdominal discomfort. -Increase Miralax  to one capful two to three times a day, adjusting based on bowel movement frequency. -Gradually reduce Dulcolax to one a day, then every other day, while increasing Miralax . -Continue Movantik  25 mg, switch to nighttime dosing. -Start Linzess  290 mcg once daily in the morning on an empty stomach. Discontinue if diarrhea occurs. -Stop taking probiotic gummies and reduce other gummy supplements. -Consider switching magnesium  gummies to capsules, tablets, or liquid form. -Continue eating whole grains, fruits, vegetables, and yogurt for natural probiotics.  I appreciate the  opportunity to care for you  Thank You   Kavitha Nandigam , MD

## 2024-06-14 NOTE — Progress Notes (Signed)
 Bethany [name]    [id number]    Sep 12, 1956  Primary Care Physician:Knapp, Annabelle, MD  Referring Physician: Randol Annabelle, MD [address] [place],  [place] [address]   Chief complaint:  IBS constipation, bloating   Discussed the use of AI scribe software for clinical note transcription with the patient, who gave verbal consent to proceed.  History of Present Illness Bethany [name] is a 68 year old female with a history of bowel obstructions who presents with constipation.  Constipation and bowel habits - Constipation for the past month with decreased bowel movement frequency from usual three times daily to once daily - Bowel movements are minimal in amount - Significant straining required to pass stool, described as having to 'push and push and push' - Abdominal distension and hardness present, improved after taking three Dulcolax the previous night  Laxative and bowel regimen - Current regimen includes Dulcolax at night (sometimes increased to two or three tablets), Movantik  in the morning, daily glycerin suppositories, and Miralax  (one capful daily) - Uses magnesium  gummies, two daily as directed - Previously trialed Trulance and Linzess , discontinued due to diarrhea  History of bowel obstruction - History of bowel obstructions, last episode occurred one year ago - Concerned about recurrence of bowel obstruction  Dietary and supplement intake - Consumes a diet rich in whole grains, fruits, vegetables, and yogurt - Takes multiple gummies including probiotic, turmeric, ginger, and calcium gummies - Advised to reduce intake of gummies due to risk of bowel obstruction and bacterial overgrowth  Abdominal symptoms - Abdominal distension and hardness, improved with laxative use - No mention of abdominal pain, nausea, or vomiting  Obstructive sleep apnea management - Recent Inspire device placement, activated last week, currently on lowest frequency setting  with gradual weekly increase - History of intolerance to CPAP therapy  Recent surgical history - Underwent rotator cuff surgery in January    Colonoscopy 07-01-22 - One 5 mm polyp in the transverse colon, removed with a cold snare. Resected and retrieved.  - External and internal hemorrhoids Surgical [P], colon, transverse, polyp (1) - TUBULAR ADENOMA. - NO HIGH GRADE DYSPLASIA OR MALIGNANCY.   Colonoscopy 04/10/2016: 5 mm sessile polyp, melanosis, internal hemorrhoids   Colonoscopy 07-21-04 Normal   Outpatient Encounter Medications as of 06/14/2024  Medication Sig   acetaminophen  (TYLENOL ) 500 MG tablet Take 2 tablets (1,000 mg total) by mouth every 6 (six) hours.   bisacodyl  (DULCOLAX) 10 MG suppository Unwrap and place 1 suppository (10 mg total) rectally daily as needed for moderate constipation.   bisacodyl  (DULCOLAX) 5 MG EC tablet Take 1 tablet (5 mg total) by mouth daily as needed for moderate constipation.   Calcium Carbonate (CALCIUM 500 PO) Take 2 tablets by mouth daily.   Cholecalciferol (VITAMIN D ) 50 MCG (2000 UT) CAPS Take 1 each by mouth daily.   Collagen-Vitamin C-Biotin (COLLAGEN PO) Take 30 mLs by mouth daily.   COLOSTRUM PO Take by mouth.   cyanocobalamin  (VITAMIN B12) 1000 MCG tablet Take 1,000 mcg by mouth daily.   cycloSPORINE  (RESTASIS ) 0.05 % ophthalmic emulsion Place 1 drop into both eyes in the morning, at noon, in the evening, and at bedtime.   diphenoxylate -atropine  (LOMOTIL ) 2.5-0.025 MG tablet Take 1 tablet by mouth 4 (four) times daily as needed for diarrhea or loose stools.   docusate sodium  (COLACE) 100 MG capsule Take 2 capsules (200 mg total) by mouth 2 (two) times daily for 8 days then  as directed by MD   famotidine  (PEPCID ) 20 MG tablet Take 1 tablet (20 mg total) by mouth at bedtime.   gabapentin  (NEURONTIN ) 600 MG tablet Take 600-1,200 mg by mouth See admin instructions. Take 600 mg by mouth in the morning and 1200 mg at night   hydrocortisone  2.5  % cream Apply 1 Application topically 2 (two) times daily as needed (hemorrhoids).   lamoTRIgine  (LAMICTAL ) 100 MG tablet Take 100 mg by mouth 2 (two) times daily.    MAGNESIUM  CITRATE PO Take 250 mg by mouth daily.   MOVANTIK  25 MG TABS tablet TAKE 1 TABLET(25 MG) BY MOUTH DAILY   Multiple Vitamins-Minerals (ADULT GUMMY PO) Take 1 each by mouth at bedtime. Vitamin B12 and Vitamin D  gummy   ondansetron  (ZOFRAN -ODT) 4 MG disintegrating tablet Take 1 tablet (4 mg total) by mouth every 6 (six) hours as needed for nausea.   polyethylene glycol (MIRALAX ) 17 g packet Take 17 g by mouth daily.   simethicone  (MYLICON) 80 MG chewable tablet Chew 1 tablet (80 mg total) by mouth every 6 (six) hours as needed for flatulence.   simvastatin  (ZOCOR ) 20 MG tablet TAKE 1 TABLET(20 MG) BY MOUTH DAILY   tizanidine  (ZANAFLEX ) 2 MG capsule Take 1 capsule (2 mg total) by mouth 3 (three) times daily as needed for muscle spasms.   traZODone  (DESYREL ) 50 MG tablet Take 50 mg by mouth at bedtime.   Turmeric-Ginger 150-25 MG CHEW Chew 1 each by mouth daily.   valACYclovir  (VALTREX ) 1000 MG tablet Take by mouth as needed.   vitamin C (ASCORBIC ACID) 500 MG tablet Take 500 mg by mouth every evening.   zinc gluconate 50 MG tablet Take 50 mg by mouth every evening.   [DISCONTINUED] ALPRAZolam  (XANAX ) 0.5 MG tablet Take 1 tablet (0.5 mg total) by mouth 2 (two) times daily. (Patient not taking: Reported on 05/08/2024)   No facility-administered encounter medications on file as of 06/14/2024.    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 All Paps normal afterwards   Degenerative disc disease, lumbar 05/02/2020   Generalized anxiety disorder    Herpes genitalis    Herpes labialis    History of calcium pyrophosphate deposition disease (CPPD) 03/26/2020   History of opiate abuse, in remission    History of small bowel obstruction 04/17/2018   partial   Hyperlipidemia with target LDL less than 100 06/17/2011   IBS (irritable bowel syndrome)    Lumbago with sciatica, right side 02/10/2021   Major depressive disorder    Meibomian gland dysfunction (MGD) of both eyes 08/02/2020   Narcotic bowel syndrome (HCC)    Osteopenia 04/2014   T score -1.7 FRAX 7.3%/0.8% stable from prior DEXA 2013   Raynaud's disease 06/17/2011   Recovering alcoholic in remission     S/P LASIK (laser assisted in situ keratomileusis) 08/02/2020   right eye   Sleep apnea    Spondylolisthesis of lumbar region 02/10/2021   Status post total right knee replacement 10/31/2019   Tubular adenoma of colon 04/21/2016  Past Surgical History:  Procedure Laterality Date   AUGMENTATION MAMMAPLASTY     BASAL CELL CARCINOMA EXCISION  12/24/2023   left superior temple shave   BREAST ENHANCEMENT SURGERY  2006   CATARACT EXTRACTION Bilateral    Jan/Feb 2024   COSMETIC SURGERY     DRUG INDUCED ENDOSCOPY N/A 12/20/2023   Procedure: DRUG INDUCED ENDOSCOPY;  Surgeon: Tobie Eldora NOVAK, MD;  Location: [facility];  Service: ENT;  Laterality: N/A;   EYE SURGERY     lasik od   GYNECOLOGIC CRYOSURGERY  1990   IMPLANTATION OF HYPOGLOSSAL NERVE STIMULATOR Right 04/28/2024   Procedure: INSERTION HYPOGLOSSAL NERVE STIMULATOR;  Surgeon: Tobie Eldora NOVAK, MD;  Location: [facility];  Service: ENT;  Laterality: Right;   LAPAROTOMY N/A 04/22/2018   Procedure: EXPLORATORY LAPAROTOMY;  Surgeon: Vanderbilt Ned, MD;  Location: [facility];  Service: General;  Laterality: N/A;    LAPAROTOMY N/A 05/14/2023   Procedure: EXPLORATORY LAPAROTOMY;  Surgeon: Vanderbilt Ned, MD;  Location: [facility];  Service: General;  Laterality: N/A;   LIGAMENT REPAIR Right 09/24/2015   Procedure: RIGHT INDEX METACARPAL PHALANGEAL RADIAL COLLATERAL LIGAMENT REPAIR;  Surgeon: Franky Curia, MD;  Location: [facility];  Service: Orthopedics;  Laterality: Right;   LYSIS OF ADHESION N/A 05/14/2023   Procedure: LYSIS OF ADHESION, REPAIR OF SMALL BOWEL SEROSAL TEAR x 3 WITH FULL-THICKNESS REPAIR x 1;  Surgeon: Vanderbilt Ned, MD;  Location: [facility];  Service: General;  Laterality: N/A;   MASS EXCISION N/A 08/04/2018   Procedure: REMOVAL OF SUTURE FROM ABDOMEN/ERAS PATHWAY;  Surgeon: Vanderbilt Ned, MD;  Location: [facility];  Service: General;  Laterality: N/A;   OOPHORECTOMY     BSO   Right knee arthoscopy Right 09/09/2017   [facility]   SHOULDER ARTHROSCOPY Right 11/2023   Dr. Cristy [facility] repair)   SHX 1 Left 02/19/2020   basel cell carcinoma   SHX1 Right 12/11/2020   basal cell carinoma,micronodular pattern   SKIN BIOPSY Right 02/28/2019   Superficial basal carcinoma    SKIN BIOPSY Left 11/24/2021   superficial basal carcinoma of the left forearm   TONSILLECTOMY AND ADENOIDECTOMY     TOTAL KNEE ARTHROPLASTY Right 04/12/2018   Procedure: RIGHT TOTAL KNEE ARTHROPLASTY;  Surgeon: Sheril Coy, MD;  Location: [facility];  Service: Orthopedics;  Laterality: Right;   TRANSFORAMINAL LUMBAR INTERBODY FUSION W/ MIS 1 LEVEL N/A 09/03/2021   Procedure: Lumbar four-five Minimally invasive transforaminal lumbar interbody fusion;  Surgeon: Dawley, Lani BROCKS, DO;  Location: [facility];  Service: Neurosurgery;  Laterality: N/A;   TUBAL LIGATION     UPPER GASTROINTESTINAL ENDOSCOPY  04/24/2005   VAGINAL HYSTERECTOMY  2005   TVH BSO  adenomyosis    Family History  Problem Relation Age of Onset   Hypertension Mother    Dementia Mother        rapid decline; concerns for  Creutzfeldt-Jakob disease   Lymphoma Father 6   Cancer Father    Early death Father    Breast cancer Sister 45   Hypertension Daughter    Hypertension Daughter        resolved with weight loss   Colon cancer Neg Hx     Social History   Socioeconomic History   Marital status: Married    Spouse name: Not on file   Number of children: Not on file   Years of education: 16   Highest education level: Bachelor's degree (e.g., BA, AB, BS)  Occupational History   Occupation: Retired    Comment: Librarian, academic  Tobacco Use   Smoking status: Former    Current packs/day: 0.00    Average packs/day: 1 pack/day for 40.0 years (40.0 ttl pk-yrs)    Types: Cigarettes    Start date: 03/21/1966    Quit date: 03/21/2006    Years since quitting: 18.2   Smokeless tobacco: Never   Tobacco comments:    Uses 10 nicorette lozenge daily  Vaping Use   Vaping status: Never Used  Substance and Sexual Activity   Alcohol use: Not Currently    Comment: history of alcohol abuse/dependence. Sober for past 20 years   Drug use: Not Currently    Types: Oxycodone     Comment: history of opiate abuse. very limited/careful use after surgery   Sexual activity: Yes    Birth control/protection: Surgical    Comment: HYST-1st intercourse 68 yo-More than 5 partners  Other Topics Concern   Not on file  Social History Narrative   Married. 1 dog (golden doodle puppy)   2 daughters, 1 grandson, 1 granddaughter (local).   1 daughter lives here, one in [place].   Retired Librarian, academic      Reviewed 05/2024   Social Drivers of Health   Financial Resource Strain: Low Risk  (06/05/2024)   Overall Financial Resource Strain (CARDIA)    Difficulty of Paying Living Expenses: Not hard at all  Food Insecurity: No Food Insecurity (06/05/2024)   Hunger Vital Sign    Worried About Running Out of Food in the Last Year: Never true    Ran Out of Food in the Last Year: Never true  Transportation Needs: No Transportation  Needs (06/05/2024)   PRAPARE - Administrator, Civil Service (Medical): No    Lack of Transportation (Non-Medical): No  Physical Activity: Sufficiently Active (06/05/2024)   Exercise Vital Sign    Days of Exercise per Week: 5 days    Minutes of Exercise per Session: 80 min  Stress: No Stress Concern Present (06/05/2024)   [employer] of Occupational Health - Occupational Stress Questionnaire    Feeling of Stress: Only a little  Social Connections: Socially Integrated (06/05/2024)   Social Connection and Isolation Panel    Frequency of Communication with Friends and Family: More than three times a week    Frequency of Social Gatherings with Friends and Family: Three times a week    Attends Religious Services: More than 4 times per year    Active Member of Clubs or Organizations: Yes    Attends Banker Meetings: More than 4 times per year    Marital Status: Married  Catering manager Violence: Not At Risk (05/17/2023)   Humiliation, Afraid, Rape, and Kick questionnaire    Fear of Current or Ex-Partner: No    Emotionally Abused: No    Physically Abused: No    Sexually Abused: No      Review of systems: All other review of systems negative except as mentioned in the HPI.   Physical Exam: Vitals:   06/14/24 0826  BP: 124/62  Pulse: 65   Body mass index is 19.3 kg/m. Gen:      No acute distress HEENT:  sclera anicteric Abd:      soft, non-tender; no palpable masses, no distension Ext:    No edema Neuro: alert and oriented x 3 Psych: normal mood and affect  Data Reviewed:  Reviewed labs, radiology imaging, old records and pertinent past GI  work up   Assessment & Plan   32 yr F with h/o SBO secondary to adhesions s/p ex lap with lysis of adhesions and distal ileum resection 04/2018 with recurrent SBO secondary to adhesions, last episode in July 2024 with recurrent small bowel obstruction s/p ex lap with small bowel resection  IBS constipation and  abdominal bloating Chronic constipation with decreased bowel movements to once daily over the past month, associated with abdominal distension and discomfort. Current regimen includes Dulcolax, Movantik , Miralax , magnesium  gummies, and occasional glycerin suppositories. Previous use of Trulance and Linzess  resulted in diarrhea. Insurance denied Xifaxan  prescription. Concerns about potential bowel obstructions due to past surgical history and adhesions. Discussed risks of antibiotic use, including gut microbiome disruption. Emphasized the need to adjust Miralax  dosage to achieve consistent bowel movements without exceeding three times daily. Advised against the use of Dulcolax due to potential tolerance and recommended titration of Miralax  instead. Highlighted the potential binding effects of gummy supplements and the risk of bacterial overgrowth from excessive probiotics. - Increase Miralax  to one capful two to three times a day, adjusting based on bowel movement frequency. - Taper off Dulcolax gradually, reducing to one a day, then every other day, while increasing Miralax . - Continue Movantik  25 mg, switch to nighttime dosing. - Start Linzess  290 mcg once daily in the morning on an empty stomach. Discontinue if diarrhea occurs. - Discontinue probiotic gummies and reduce other gummy supplements due to potential for bacterial overgrowth and binding effects. - Consider switching magnesium  gummies to capsules, tablets, or liquid form. - Encourage dietary intake of whole grains, fruits, vegetables, and yogurt for natural probiotics.    This visit required 40 minutes of patient care (this includes precharting, chart review, review of results, face-to-face time used for counseling as well as treatment plan and follow-up. The patient was provided an opportunity to ask questions and all were answered. The patient agreed with the plan and demonstrated an understanding of the instructions.  LOIS Wilkie Mcgee ,  MD    CC: Randol Dawes, MD

## 2024-06-16 ENCOUNTER — Ambulatory Visit (INDEPENDENT_AMBULATORY_CARE_PROVIDER_SITE_OTHER): Admitting: Otolaryngology

## 2024-06-16 ENCOUNTER — Encounter (INDEPENDENT_AMBULATORY_CARE_PROVIDER_SITE_OTHER): Payer: Self-pay | Admitting: Otolaryngology

## 2024-06-16 VITALS — BP 121/74 | HR 76 | Ht 65.0 in | Wt 112.0 lb

## 2024-06-16 DIAGNOSIS — G4733 Obstructive sleep apnea (adult) (pediatric): Secondary | ICD-10-CM

## 2024-06-16 DIAGNOSIS — Z789 Other specified health status: Secondary | ICD-10-CM

## 2024-06-16 DIAGNOSIS — Z9889 Other specified postprocedural states: Secondary | ICD-10-CM

## 2024-06-16 NOTE — Progress Notes (Signed)
 S/p Hypoglossal nerve stimulator placement on 04/28/2024 S: Doing well overall, has been doing silicone strips. No issue with incision but concerned about lump under neck incision. O: Tongue movement intact; no lower lip weakness; neck and chest incision c/d/I and very well healed; under neck incision, some asymmetric prominence of submandibular gland and slight scarring A/P: 68 y.o. y.o. w/ OSA s/p Hypoglossal nerve stimulator placement on 04/28/2024 Doing well, no evidence of infection Very thin neck, does have some prominence based on how the submandibular gland has healed. We discussed nature of this - given proximity to lead, would not recommend revision for this. But some scarring of underlying platysma, so will do Kenalog  injection to see if we can get it to soften F/u 3 weeks

## 2024-06-18 NOTE — Patient Instructions (Incomplete)
  HEALTH MAINTENANCE RECOMMENDATIONS:  It is recommended that you get at least 30 minutes of aerobic exercise at least 5 days/week (for weight loss, you may need as much as 60-90 minutes). This can be any activity that gets your heart rate up. This can be divided in 10-15 minute intervals if needed, but try and build up your endurance at least once a week.  Weight bearing exercise is also recommended twice weekly.  Eat a healthy diet with lots of vegetables, fruits and fiber.  Colorful foods have a lot of vitamins (ie green vegetables, tomatoes, red peppers, etc).  Limit sweet tea, regular sodas and alcoholic beverages, all of which has a lot of calories and sugar.  Up to 1 alcoholic drink daily may be beneficial for women (unless trying to lose weight, watch sugars).  Drink a lot of water .  Calcium recommendations are 1200-1500 mg daily (1500 mg for postmenopausal women or women without ovaries), and vitamin D  1000 IU daily.  This should be obtained from diet and/or supplements (vitamins), and calcium should not be taken all at once, but in divided doses.  Monthly self breast exams and yearly mammograms for women over the age of 30 is recommended.  Sunscreen of at least SPF 30 should be used on all sun-exposed parts of the skin when outside between the hours of 10 am and 4 pm (not just when at beach or pool, but even with exercise, golf, tennis, and yard work!)  Use a sunscreen that says broad spectrum so it covers both UVA and UVB rays, and make sure to reapply every 1-2 hours.  Remember to change the batteries in your smoke detectors when changing your clock times in the spring and fall. Carbon monoxide detectors are recommended for your home.  Use your seat belt every time you are in a car, and please drive safely and not be distracted with cell phones and texting while driving.  Please bring us  copies of your Living Will and Healthcare Power of Attorney once completed and notarized so that  it can be scanned into your medical chart.  We discussed using non-dairy milks, as these have more calcium than regular milk. We discussed vanilla almond milk (sweetened or unsweetened). We discussed possibly taking Tums as a calcium supplement, to replace the gummy (since you don't need the extra D). Resume taking the 1000 IU D3 pills daily.

## 2024-06-18 NOTE — Progress Notes (Unsigned)
 No chief complaint on file.  Bethany [name] is a 68 y.o. female who presents for annual physical exam (she had Medicare Wellness visit in July), and follow-up on chronic medical conditions.    OSA:  She underwent RIGHT 12th cranial nerve (hypoglossal) stimulation implant with placement of chest wall respiratory sensor by Dr. Tobie on 04/28/24 Earl).   She saw Dr. Tobie in follow-up this past Friday. some asymmetric prominence of submandibular gland and slight scarring noted; plan is for kenelog injections.   Hyperlipidemia follow-up:  Patient is reportedly following a low-fat, low cholesterol diet.  Compliant with medications (20 mg simvastatin ) and denies medication side effects. She denies changes to her diet. She is due for recheck of labs.  She had a couple of sips of coffee with cream this morning.  Lab Results  Component Value Date   CHOL 217 (H) 05/08/2024   HDL 88 05/08/2024   LDLCALC 117 (H) 05/08/2024   TRIG 71 05/08/2024   CHOLHDL 2.5 05/08/2024   H/o IBS and h/o SBOs.  She is under the care of Dr. Shila, last seen last week. She had been using Movantik , Dulcolax (sometimes needing 2-3 tablets) and MiraLAX  1 capful daily.  She had also been taking various gummies.  She recommended stopping Dulcolax, and instead titrating up the miralax  to 2-3x/day (titrating based on bowels). She recommended reducing intake of gummies, due to risk of bacterial overgrowth and bowel obstruction.   She has chronic pain.   Back pain--sees Dr. Claudene, had MRI in June, and an epidural injection in July.  ***UPDATE  She had RF ablation for her R knee pain by Dr. Eudelia in 10/2023. She denies any significant results (maybe just for a few weeks). She takes gabapentin  (for moods and pain) and tizanidine  prn.  Osteopenia: last DEXA was in 06/2023.  T-1.3 at spine, -1.7 at R fem neck, -1.4 at L fem neck.  This is stable from 09/2020. She gets weight-bearing exercise. She takes vitamin D , and  has been taking calcium gummies since the last physical (2/day, taken at the same time). ***SWITCHED??  Vitamin D  level was 87.3 iin July, when taking 1000 IU daily, plus thought another gummy also had some D in it.  Previously it had been 65.8 in 04/2023 (when taking 1000 IU daily).  She has h/o elevated level at 107.37 in 11/2020. She was advised to decrease the dose, taking the separate 1000 IU D3 just 3x/week.  Today she reports ***UPDATE STOP GUMMY??  Component Ref Range & Units (hover) 1 mo ago 1 yr ago 12 yr ago  Vit D, 25-Hydroxy 87.3 65.8 CM 70 R, CM    She is under the care of psychiatrist for anxiety, depression and ADHD (initially started seeing psych for her issues with substance abuse). She reports moods are good, she is managing.   Immunization History  Administered Date(s) Administered   Fluad Quad(high Dose 65+) 08/20/2022   Fluad Trivalent(High Dose 65+) 08/23/2023   Influenza Split 08/20/2015   Influenza Whole 07/02/2010, 07/25/2011   Influenza,inj,Quad PF,6+ Mos 09/07/2013, 09/10/2014, 08/20/2016, 08/04/2017, 07/28/2018, 08/16/2019, 08/28/2020, 07/21/2021   PFIZER(Purple Top)SARS-COV-2 Vaccination 01/25/2020, 02/15/2020, 08/28/2020   Pfizer Covid-19 Vaccine Bivalent Booster 33yrs & up 08/25/2021   Pneumococcal Conjugate-13 12/10/2020   Pneumococcal Polysaccharide-23 04/23/2022   Tdap 04/27/2008, 09/13/2018   Zoster Recombinant(Shingrix ) 08/04/2017, 09/13/2018   Zoster, Live 01/07/2016   Last Pap smear: N/A. S/p TVH-BSO in 2005 for adenomyosis.  Last mammogram: 11/2023 (at [facility]) Last colonoscopy: 06/2022, tubular  adenoma, 7 yr f/u rec Last DEXA: 06/2023.  T-1.3 at spine, -1.7 at R fem neck, -1.4 at L fem neck.  This is stable from 09/2020. Dentist: twice yearly Ophtho: yearly  Exercise: walks 3-4x/week, goes to the gym 2x/week--weights, elliptical.   Hasn't played pickleball since her shoulder surgery in 11/2023 (plans to).   End of Life Discussion:  Patient  has a living will and medical power of attorney, not in chart.   PMH, PSH, SH and FH were reviewed and updated  Outpatient Encounter Medications as of 06/19/2024  Medication Sig Note   acetaminophen  (TYLENOL ) 500 MG tablet Take 2 tablets (1,000 mg total) by mouth every 6 (six) hours.    bisacodyl  (DULCOLAX) 10 MG suppository Unwrap and place 1 suppository (10 mg total) rectally daily as needed for moderate constipation. 10/13/2023: As needed   bisacodyl  (DULCOLAX) 5 MG EC tablet Take 1 tablet (5 mg total) by mouth daily as needed for moderate constipation. 05/08/2024: Uses prn, about 3-4x/week   Calcium Carbonate (CALCIUM 500 PO) Take 2 tablets by mouth daily. 05/08/2024: Takes 2 gummies daily.  500 mg (unsure if that is per serving or per gummy)   Cholecalciferol (VITAMIN D ) 50 MCG (2000 UT) CAPS Take 1 each by mouth daily. 09/16/2023: Gummy w/calcium   Collagen-Vitamin C-Biotin (COLLAGEN PO) Take 30 mLs by mouth daily. 09/16/2023: With biotin (liquid from [employer])   COLOSTRUM PO Take by mouth.    cyanocobalamin  (VITAMIN B12) 1000 MCG tablet Take 1,000 mcg by mouth daily. 09/16/2023: gummy   cycloSPORINE  (RESTASIS ) 0.05 % ophthalmic emulsion Place 1 drop into both eyes in the morning, at noon, in the evening, and at bedtime.    diphenoxylate -atropine  (LOMOTIL ) 2.5-0.025 MG tablet Take 1 tablet by mouth 4 (four) times daily as needed for diarrhea or loose stools. 10/13/2023: As needed   docusate sodium  (COLACE) 100 MG capsule Take 2 capsules (200 mg total) by mouth 2 (two) times daily for 8 days then as directed by MD 10/13/2023: Takes 2 nightly   famotidine  (PEPCID ) 20 MG tablet Take 1 tablet (20 mg total) by mouth at bedtime.    gabapentin  (NEURONTIN ) 600 MG tablet Take 600-1,200 mg by mouth See admin instructions. Take 600 mg by mouth in the morning and 1200 mg at night 10/13/2023: Takes 1qm and 2qpm   hydrocortisone  2.5 % cream Apply 1 Application topically 2 (two) times daily as needed  (hemorrhoids). 10/13/2023: As needed   lamoTRIgine  (LAMICTAL ) 100 MG tablet Take 100 mg by mouth 2 (two) times daily.     MAGNESIUM  CITRATE PO Take 250 mg by mouth daily. 09/16/2023: gummy   Multiple Vitamins-Minerals (ADULT GUMMY PO) Take 1 each by mouth at bedtime. Vitamin B12 and Vitamin D  gummy    naloxegol  oxalate (MOVANTIK ) 25 MG TABS tablet Take 1 tablet (25 mg total) by mouth daily.    ondansetron  (ZOFRAN -ODT) 4 MG disintegrating tablet Take 1 tablet (4 mg total) by mouth every 6 (six) hours as needed for nausea. 10/13/2023: As needed   polyethylene glycol (MIRALAX ) 17 g packet Take 17 g by mouth daily.    simethicone  (MYLICON) 80 MG chewable tablet Chew 1 tablet (80 mg total) by mouth every 6 (six) hours as needed for flatulence. 10/13/2023: As needed   simvastatin  (ZOCOR ) 20 MG tablet TAKE 1 TABLET(20 MG) BY MOUTH DAILY    tizanidine  (ZANAFLEX ) 2 MG capsule Take 1 capsule (2 mg total) by mouth 3 (three) times daily as needed for muscle spasms. 10/13/2023: As needed  traZODone  (DESYREL ) 50 MG tablet Take 50 mg by mouth at bedtime.    Turmeric-Ginger 150-25 MG CHEW Chew 1 each by mouth daily. 09/16/2023: Gummy    valACYclovir  (VALTREX ) 1000 MG tablet Take by mouth as needed. 10/13/2023: As needed   vitamin C (ASCORBIC ACID) 500 MG tablet Take 500 mg by mouth every evening.    zinc gluconate 50 MG tablet Take 50 mg by mouth every evening.    No facility-administered encounter medications on file as of 06/19/2024.   Allergies  Allergen Reactions   Buprenorphine Hcl Nausea And Vomiting   Morphine  And Codeine Nausea Only     ROS:  Patient denies anorexia, fever, weight changes, headaches, URI symptoms, chest pain, palpitations, dizziness, syncope, dyspnea on exertion, cough, swelling, nausea, vomiting, diarrhea, abdominal pain, melena, hematochezia, indigestion/heartburn, hematuria, incontinence, dysuria, vaginal bleeding, discharge, odor or itch, genital lesions, numbness, tingling,  weakness, tremor, suspicious skin lesions, dabnormal bleeding/bruising, or enlarged lymph nodes.  Hearing loss, tinnitus bilaterally Some ongoing vision problems --some blurriness, occasional floaters. +chronic constipation, controlled with current regimen.  Chronic back pain, and R knee pain, per HPI Shoulder pain resolved. Moods are good, some anxiety. Denies depression. Sleeping well on current medication regimen. Unrefreshed sleep, daytime somnolence, and snoring.  Known OSA, with Inspire implant placed recently. Some easy bruising. Some pain at L breast, related to implant (chronic; at one point saw plastic surgery for consult for an exchange, too expensive.) Unchanged. Some urinary incontinence with urge, cough/sneeze. Uses a pad.    PHYSICAL EXAM:  There were no vitals taken for this visit.  Wt Readings from Last 3 Encounters:  06/16/24 112 lb (50.8 kg)  06/14/24 116 lb (52.6 kg)  05/08/24 112 lb 3.2 oz (50.9 kg)   Pleasant, well-appearing thin female, in no distress HEENT: conjunctiva and sclera are clear, EOMI. Neck: Healing incision R neck, with some STS. No erythema, warmth or lymphadenopathy Heart: regular rate and rhythm Lungs: clear bilaterally Back: no spinal or CVA tenderness Abdomen: soft, nontender, no organomegaly or mass Extremities: no edema, 2+ pulses. Psych: normal mood, affect, hygiene and grooming Neuro: alert and oriented, cranial nerves grossly intact, normal gait.      ASSESSMENT/PLAN:  MOST FORM from June 2024 was reviewed and updated at 05/2024 visit, and is NOT scanned into chart. We voided 05/2023 MOST (which was different) and DNR, but should have scanned in the MOST completed at that visit (originally from 04/2023). Please see if in the folder up front, so they can scan it. (I don't need it today, but it needs to be scanned)  Remind to get copies of living will/HCPOA to us   Discussed monthly self breast exams and yearly mammograms; at least  30 minutes of aerobic activity at least 5 days/week and weight-bearing exercise 2x/week; proper sunscreen use reviewed; healthy diet, including goals of calcium and vitamin D  intake and alcohol recommendations (less than or equal to 1 drink/day) reviewed; regular seatbelt use; changing batteries in smoke detectors, use of carbon monoxide detectors.  Immunization recommendations discussed--continue yearly flu shots (high dose). Encouraged updated COVID booster when available in the Fall. Discussed RSV--likely can wait to age 17 (not high risk). Can give Prevnar-20 in 2027. Colonoscopy recommendations reviewed, UTD (due 2030).  Asked to get us  copies of living will and healthcare power of attorney   Medicare Attestation I have personally reviewed: The patient's medical and social history Their use of alcohol, tobacco or illicit drugs Their current medications and supplements The patient's functional ability  including ADLs,fall risks, home safety risks, cognitive, and hearing and visual impairment Diet and physical activities Evidence for depression or mood disorders  The patient's weight, height, BMI have been recorded in the chart.  I have made referrals, counseling, and provided education to the patient based on review of the above and I have provided the patient with a written personalized care plan for preventive services.     Annabelle DELENA Fetters, MD

## 2024-06-19 ENCOUNTER — Ambulatory Visit (INDEPENDENT_AMBULATORY_CARE_PROVIDER_SITE_OTHER): Admitting: Family Medicine

## 2024-06-19 ENCOUNTER — Encounter: Payer: Self-pay | Admitting: Family Medicine

## 2024-06-19 ENCOUNTER — Other Ambulatory Visit: Payer: Self-pay

## 2024-06-19 VITALS — BP 104/68 | HR 76 | Ht 65.0 in | Wt 114.4 lb

## 2024-06-19 DIAGNOSIS — M5416 Radiculopathy, lumbar region: Secondary | ICD-10-CM | POA: Diagnosis not present

## 2024-06-19 DIAGNOSIS — Z0000 Encounter for general adult medical examination without abnormal findings: Secondary | ICD-10-CM

## 2024-06-19 DIAGNOSIS — D692 Other nonthrombocytopenic purpura: Secondary | ICD-10-CM | POA: Diagnosis not present

## 2024-06-19 DIAGNOSIS — G4733 Obstructive sleep apnea (adult) (pediatric): Secondary | ICD-10-CM

## 2024-06-19 DIAGNOSIS — M85851 Other specified disorders of bone density and structure, right thigh: Secondary | ICD-10-CM

## 2024-06-19 DIAGNOSIS — K581 Irritable bowel syndrome with constipation: Secondary | ICD-10-CM | POA: Diagnosis not present

## 2024-06-19 DIAGNOSIS — M85852 Other specified disorders of bone density and structure, left thigh: Secondary | ICD-10-CM

## 2024-06-19 MED ORDER — LINACLOTIDE 290 MCG PO CAPS: 290.0000 ug | ORAL_CAPSULE | Freq: Every day | ORAL | 6 refills | Status: DC

## 2024-06-20 ENCOUNTER — Telehealth: Payer: Self-pay

## 2024-06-20 ENCOUNTER — Other Ambulatory Visit (HOSPITAL_COMMUNITY): Payer: Self-pay

## 2024-06-20 NOTE — Telephone Encounter (Signed)
 Pharmacy Patient Advocate Encounter   Received notification from CoverMyMeds that prior authorization for Linzess  capsules is required/requested.   Insurance verification completed.   The patient is insured through [facility] .   Per test claim: PA required; PA submitted to above mentioned insurance via Latent Key/confirmation #/EOC [id number] Status is pending

## 2024-06-21 NOTE — Telephone Encounter (Signed)
 Pharmacy Patient Advocate Encounter  Received notification from [employer] [facility] that Prior Authorization for Linzess  capsules has been DENIED.  Full denial letter will be uploaded to the media tab. See denial reason below.  The use of this medication in combination with another legend constipation medication does not establish medical necessity for this drug. Medical necessity is determined by adherence to generally accepted standards of medical practice in the [place] , is clinically appropriate, in terms of type, frequency, extent, site, duration and considered effective for the patient's illness, injury, disease, or its symptoms.   PA #/Case ID/Reference #: [id number]

## 2024-06-21 NOTE — Progress Notes (Unsigned)
 [facility] [address] [place] [address] Phone: [phone] Subjective:   Bethany [name], am serving as a scribe for Dr. Arthea Claudene.  I'm seeing this patient by the request  of:  Randol Dawes, MD  CC: back and neck pain follow up   YEP:Dlagzrupcz  Bethany [name] is a 68 y.o. female coming in with complaint of back and neck pain. OMT 04/26/2024.  Patient did have an epidural steroid injection in July.  Unfortunately states that the injection only helped for approximately 2 weeks.    Medications patient has been prescribed: None  Taking:         Reviewed prior external information including notes and imaging from previsou exam, outside providers and external EMR if available.   As well as notes that were available from care everywhere and other healthcare systems.  Past medical history, social, surgical and family history all reviewed in electronic medical record.  No pertanent information unless stated regarding to the chief complaint.   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 All Paps normal afterwards   Degenerative disc disease, lumbar 05/02/2020   Generalized anxiety disorder    Herpes genitalis    Herpes labialis    History of calcium pyrophosphate deposition disease (CPPD) 03/26/2020   History of opiate abuse, in remission    History of small bowel obstruction 04/17/2018   partial   Hyperlipidemia with target LDL less than 100 06/17/2011   IBS (irritable bowel syndrome)    Lumbago with sciatica, right side 02/10/2021   Major depressive disorder    Meibomian gland dysfunction (MGD) of both eyes 08/02/2020   Narcotic bowel syndrome (HCC)     Osteopenia 04/2014   T score -1.7 FRAX 7.3%/0.8% stable from prior DEXA 2013   Raynaud's disease 06/17/2011   Recovering alcoholic in remission     S/P LASIK (laser assisted in situ keratomileusis) 08/02/2020   right eye   Sleep apnea    Spondylolisthesis of lumbar region 02/10/2021   Status post total right knee replacement 10/31/2019   Tubular adenoma of colon 04/21/2016    Allergies  Allergen Reactions   Buprenorphine Hcl Nausea And Vomiting   Morphine  And Codeine Nausea Only     Review of Systems:  No headache, visual changes, nausea, vomiting, diarrhea, constipation, dizziness, abdominal pain, skin rash, fevers, chills, night sweats, weight loss, swollen lymph nodes, body aches, joint swelling, chest pain, shortness of breath, mood changes. POSITIVE muscle aches  Objective  Blood pressure 102/72, height 5' 5 (1.651 m), weight 116 lb (52.6 kg).   General: No apparent distress alert and oriented x3 mood and affect normal, dressed appropriately.  HEENT: Pupils equal, extraocular movements intact  Respiratory: Patient's speak in full sentences and does not appear short of breath  Cardiovascular: No lower extremity edema, non tender, no erythema  Gait MSK:  Back does have significant loss of lordosis noted.  Worsening pain with extension of the back.  Diffuse tenderness from L2-L5 in the paraspinal muscles to right greater than left.  No midline tenderness.  Osteopathic findings  C2 flexed rotated and side bent right C6 flexed rotated and side bent left T3 extended rotated and side bent right inhaled  rib T9 extended rotated and side bent left L2 flexed rotated and side bent right Sacrum right on right       Assessment and Plan:  Lumbago with sciatica, right side Chronic problem noted.  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  Will attempt a facet injection on the bilateral sides to see if this does make any significant difference.  We discussed the  potential trial and error as well.  Patient does respond fairly well to the muscle energy techniques for the low back.  Avoided the HVLA.  No change in medications at the moment and want to avoid it with patient having a history of this with bowel obstruction as well.  Follow-up again in 6 to 8 weeks    Nonallopathic problems  Decision today to treat with OMT was based on Physical Exam  After verbal consent patient was treated with  ME, techniques in cervical, rib, thoracic, lumbar, and sacral  areas  Patient tolerated the procedure well with improvement in symptoms  Patient given exercises, stretches and lifestyle modifications  See medications in patient instructions if given  Patient will follow up in 6-8 weeks    The above documentation has been reviewed and is accurate and complete Kerby Hockley M Christianne Zacher, DO          Note: This dictation was prepared with Dragon dictation along with smaller phrase technology. Any transcriptional errors that result from this process are unintentional.

## 2024-06-22 ENCOUNTER — Encounter: Payer: Self-pay | Admitting: Family Medicine

## 2024-06-22 ENCOUNTER — Ambulatory Visit: Admitting: Family Medicine

## 2024-06-22 VITALS — BP 102/72 | Ht 65.0 in | Wt 116.0 lb

## 2024-06-22 DIAGNOSIS — M9908 Segmental and somatic dysfunction of rib cage: Secondary | ICD-10-CM | POA: Diagnosis not present

## 2024-06-22 DIAGNOSIS — M47816 Spondylosis without myelopathy or radiculopathy, lumbar region: Secondary | ICD-10-CM

## 2024-06-22 DIAGNOSIS — G8929 Other chronic pain: Secondary | ICD-10-CM | POA: Diagnosis not present

## 2024-06-22 DIAGNOSIS — M9904 Segmental and somatic dysfunction of sacral region: Secondary | ICD-10-CM

## 2024-06-22 DIAGNOSIS — M5441 Lumbago with sciatica, right side: Secondary | ICD-10-CM | POA: Diagnosis not present

## 2024-06-22 DIAGNOSIS — M9901 Segmental and somatic dysfunction of cervical region: Secondary | ICD-10-CM

## 2024-06-22 DIAGNOSIS — M9902 Segmental and somatic dysfunction of thoracic region: Secondary | ICD-10-CM

## 2024-06-22 DIAGNOSIS — M9903 Segmental and somatic dysfunction of lumbar region: Secondary | ICD-10-CM

## 2024-06-22 NOTE — Patient Instructions (Addendum)
 Facet injections L3/L4 [phone] See me again in 6-8 weeks

## 2024-06-22 NOTE — Assessment & Plan Note (Signed)
 Chronic problem noted.  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  Will attempt a facet injection on the bilateral sides to see if this does make any significant difference.  We discussed the potential trial and error as well.  Patient does respond fairly well to the muscle energy techniques for the low back.  Avoided the HVLA.  No change in medications at the moment and want to avoid it with patient having a history of this with bowel obstruction as well.  Follow-up again in 6 to 8 weeks

## 2024-06-28 NOTE — Telephone Encounter (Signed)
 error

## 2024-07-04 ENCOUNTER — Telehealth: Payer: Self-pay

## 2024-07-04 NOTE — Telephone Encounter (Signed)
 Patient is aware of the insurance denial for her prescription of Linzess . She will contact [employer] to give permission for an appeal through [employer]. Please start an appeal for the patient. Thank you.

## 2024-07-04 NOTE — Telephone Encounter (Signed)
 Information has been sent to clinical pharmacist for appeals review. It may take 5-7 days to prepare the necessary documentation to request the appeal from the insurance.

## 2024-07-05 ENCOUNTER — Telehealth: Payer: Self-pay | Admitting: Pharmacist

## 2024-07-05 NOTE — Telephone Encounter (Signed)
 Since the insurance plan has a no dual therapy rule, could you please provide the clinical rationale for the patient requiring treatment with both Linzess  and Movantik ?  Also, if we proceed with an appeal, we will need a copy of the permission letter the patient signed.  Thank you, Devere Pandy, PharmD Clinical Pharmacist  [place]  Direct Dial: [phone]

## 2024-07-06 DIAGNOSIS — Z4542 Encounter for adjustment and management of neuropacemaker (brain) (peripheral nerve) (spinal cord): Secondary | ICD-10-CM | POA: Diagnosis not present

## 2024-07-06 DIAGNOSIS — G4733 Obstructive sleep apnea (adult) (pediatric): Secondary | ICD-10-CM | POA: Diagnosis not present

## 2024-07-06 NOTE — Discharge Instructions (Signed)

## 2024-07-06 NOTE — Telephone Encounter (Signed)
 Authorization letter faxed to Devere Pandy, [facility]. Fax number [phone].

## 2024-07-06 NOTE — Telephone Encounter (Signed)
 Received notification from Devere through staff messages that she has received the authorization letter for this pt.

## 2024-07-07 ENCOUNTER — Ambulatory Visit
Admission: RE | Admit: 2024-07-07 | Discharge: 2024-07-07 | Disposition: A | Discharge status: Home / Self Care | Source: Ambulatory Visit | Attending: Family Medicine | Admitting: Family Medicine

## 2024-07-07 DIAGNOSIS — M47816 Spondylosis without myelopathy or radiculopathy, lumbar region: Secondary | ICD-10-CM

## 2024-07-07 DIAGNOSIS — M545 Low back pain, unspecified: Secondary | ICD-10-CM | POA: Diagnosis not present

## 2024-07-07 MED ORDER — METHYLPREDNISOLONE ACETATE 40 MG/ML INJ SUSP (RADIOLOG
80.0000 mg | Freq: Once | INTRAMUSCULAR | Status: AC
  Administered 2024-07-07: 80 mg via INTRA_ARTICULAR

## 2024-07-07 MED ORDER — IOPAMIDOL (ISOVUE-M 200) INJECTION 41%
1.0000 mL | Freq: Once | INTRAMUSCULAR | Status: AC
  Administered 2024-07-07: 1 mL via INTRA_ARTICULAR

## 2024-07-10 NOTE — Telephone Encounter (Signed)
 Wanted to follow up on this appeal request:  Since the insurance plan has a no dual therapy rule, could you please provide the clinical rationale for the patient requiring treatment with both Linzess  and Movantik ?

## 2024-07-11 ENCOUNTER — Encounter (INDEPENDENT_AMBULATORY_CARE_PROVIDER_SITE_OTHER): Payer: Self-pay | Admitting: Otolaryngology

## 2024-07-11 ENCOUNTER — Ambulatory Visit (INDEPENDENT_AMBULATORY_CARE_PROVIDER_SITE_OTHER): Admitting: Otolaryngology

## 2024-07-11 VITALS — BP 148/76 | HR 67 | Ht 65.0 in

## 2024-07-11 DIAGNOSIS — Z789 Other specified health status: Secondary | ICD-10-CM

## 2024-07-11 DIAGNOSIS — Z9889 Other specified postprocedural states: Secondary | ICD-10-CM

## 2024-07-11 DIAGNOSIS — L905 Scar conditions and fibrosis of skin: Secondary | ICD-10-CM

## 2024-07-11 DIAGNOSIS — G4733 Obstructive sleep apnea (adult) (pediatric): Secondary | ICD-10-CM

## 2024-07-11 NOTE — Progress Notes (Signed)
 ENT Procedure note: Name: Bethany [name] MRN: [id number] Date: 07/11/24   Pre-procedure diagnosis: Right neck scar after hypoglossal nerve stimulator placement Post-procedure diagnosis: Same Procedure: Kenalog  injection to right neck slightly hypertrophic scar after hypoglossal nerve stimulator placement Complications: None apparent EBL: 0 mL Indication: see above  Risks and benefits were explained to the patient, written consent was obtained. Risks of pain, infection, hardware malfunction (very low risk) explained.   The patient was identified as the correct patient.  The area surrounding neck scar was cleaned using an alcohol swab. The scar was slightly hypertrophic and underlying submandibular gland was less prominent today. The scar was then injected subcutaneously using 1.5cc of 10mg /mL Kenalog . The patient tolerated the procedure well. There was no bleeding or hematoma  D/w pt f/u: opted PRN   Electronically signed by: Eldora KATHEE Blanch, MD 07/11/2024 3:33 PM

## 2024-07-13 NOTE — Telephone Encounter (Signed)
 Patient has history of multiple bowel obstructions due to significant adhesions, slow colonic transit with chronic constipation.  She requires dual therapy with Movantik  and Linzess  to prevent recurrent constipation and also potentially avoiding recurrent bowel obstruction

## 2024-07-17 ENCOUNTER — Telehealth: Payer: Self-pay | Admitting: Pharmacist

## 2024-07-17 ENCOUNTER — Other Ambulatory Visit (HOSPITAL_COMMUNITY): Payer: Self-pay

## 2024-07-17 NOTE — Telephone Encounter (Signed)
 Appeal has been submitted. Will advise when response is received, please be advised that most companies may take 30 days to make a decision. Appeal letter and supporting documentation have been faxed to [phone] on 07/17/2024 @11 :10 am.  Thank you, Devere Pandy, PharmD Clinical Pharmacist    Direct Dial: [phone]

## 2024-07-17 NOTE — Telephone Encounter (Signed)
 Appeal has been submitted and documented in a separate telephone encounter.

## 2024-07-18 DIAGNOSIS — F411 Generalized anxiety disorder: Secondary | ICD-10-CM | POA: Diagnosis not present

## 2024-07-18 NOTE — Telephone Encounter (Signed)
 Additional information requested.  Faxed to [phone].

## 2024-07-21 NOTE — Telephone Encounter (Signed)
 Inbound call from Dr Ladora with  insurance requesting f/u call at [phone]   Inquiring if the patient will continue to take movantick as well? Please advise.

## 2024-07-24 NOTE — Telephone Encounter (Signed)
 Please see notes below.

## 2024-07-24 NOTE — Telephone Encounter (Signed)
 No answer. The [employer] subscriber you have contacted is not available. Please leave a message after the tone.

## 2024-07-24 NOTE — Telephone Encounter (Signed)
 Insurance requires the provider's office to contact them with this information.

## 2024-07-24 NOTE — Telephone Encounter (Signed)
 Yes. Patient has history of multiple bowel obstructions due to significant adhesions, slow colonic transit with chronic constipation.  She requires dual therapy with Movantik  and Linzess  to prevent recurrent constipation and also potentially avoiding recurrent bowel obstruction

## 2024-07-25 NOTE — Telephone Encounter (Signed)
 Again no answer. Multimedia programmer.

## 2024-07-27 NOTE — Telephone Encounter (Signed)
 Patient is advised. She would like to try taking only Linzess  with Miralax  PRN. Is this okay with you?

## 2024-07-27 NOTE — Telephone Encounter (Signed)
 Please advise patient to use 1 capful Miralax  twice daily as needed to have 2-3 soft BM daily. Thanks

## 2024-07-27 NOTE — Telephone Encounter (Signed)
 Per insurance, the appeal for Linzess  has been denied.  The full letter will be uploaded to the media tab.

## 2024-07-28 NOTE — Telephone Encounter (Signed)
 Yes we can do that if she prefers it

## 2024-08-02 DIAGNOSIS — L570 Actinic keratosis: Secondary | ICD-10-CM | POA: Diagnosis not present

## 2024-08-03 DIAGNOSIS — Z4542 Encounter for adjustment and management of neuropacemaker (brain) (peripheral nerve) (spinal cord): Secondary | ICD-10-CM | POA: Diagnosis not present

## 2024-08-03 DIAGNOSIS — G4733 Obstructive sleep apnea (adult) (pediatric): Secondary | ICD-10-CM | POA: Diagnosis not present

## 2024-08-04 ENCOUNTER — Ambulatory Visit: Admitting: Family Medicine

## 2024-08-08 ENCOUNTER — Emergency Department (HOSPITAL_BASED_OUTPATIENT_CLINIC_OR_DEPARTMENT_OTHER)

## 2024-08-08 ENCOUNTER — Other Ambulatory Visit: Payer: Self-pay

## 2024-08-08 ENCOUNTER — Emergency Department (HOSPITAL_BASED_OUTPATIENT_CLINIC_OR_DEPARTMENT_OTHER)
Admission: EM | Admit: 2024-08-08 | Discharge: 2024-08-08 | Disposition: A | Discharge status: Home / Self Care | Attending: Emergency Medicine | Admitting: Emergency Medicine

## 2024-08-08 ENCOUNTER — Other Ambulatory Visit (HOSPITAL_BASED_OUTPATIENT_CLINIC_OR_DEPARTMENT_OTHER): Payer: Self-pay

## 2024-08-08 DIAGNOSIS — M542 Cervicalgia: Secondary | ICD-10-CM | POA: Diagnosis present

## 2024-08-08 DIAGNOSIS — S3991XA Unspecified injury of abdomen, initial encounter: Secondary | ICD-10-CM | POA: Diagnosis not present

## 2024-08-08 DIAGNOSIS — R519 Headache, unspecified: Secondary | ICD-10-CM | POA: Diagnosis not present

## 2024-08-08 DIAGNOSIS — Y92410 Unspecified street and highway as the place of occurrence of the external cause: Secondary | ICD-10-CM | POA: Diagnosis not present

## 2024-08-08 DIAGNOSIS — M62838 Other muscle spasm: Secondary | ICD-10-CM | POA: Insufficient documentation

## 2024-08-08 DIAGNOSIS — S299XXA Unspecified injury of thorax, initial encounter: Secondary | ICD-10-CM | POA: Diagnosis not present

## 2024-08-08 DIAGNOSIS — S199XXA Unspecified injury of neck, initial encounter: Secondary | ICD-10-CM | POA: Diagnosis not present

## 2024-08-08 DIAGNOSIS — S161XXA Strain of muscle, fascia and tendon at neck level, initial encounter: Secondary | ICD-10-CM | POA: Diagnosis not present

## 2024-08-08 DIAGNOSIS — S3993XA Unspecified injury of pelvis, initial encounter: Secondary | ICD-10-CM | POA: Diagnosis not present

## 2024-08-08 DIAGNOSIS — E034 Atrophy of thyroid (acquired): Secondary | ICD-10-CM | POA: Diagnosis not present

## 2024-08-08 DIAGNOSIS — S0990XA Unspecified injury of head, initial encounter: Secondary | ICD-10-CM | POA: Diagnosis not present

## 2024-08-08 LAB — COMPREHENSIVE METABOLIC PANEL WITH GFR
ALT: 35 U/L (ref 0–44)
AST: 42 U/L — ABNORMAL HIGH (ref 15–41)
Albumin: 4.5 g/dL (ref 3.5–5.0)
Alkaline Phosphatase: 89 U/L (ref 38–126)
Anion gap: 8 (ref 5–15)
BUN: 15 mg/dL (ref 8–23)
CO2: 28 mmol/L (ref 22–32)
Calcium: 10.1 mg/dL (ref 8.9–10.3)
Chloride: 102 mmol/L (ref 98–111)
Creatinine, Ser: 1.06 mg/dL — ABNORMAL HIGH (ref 0.44–1.00)
GFR, Estimated: 57 mL/min — ABNORMAL LOW (ref >60)
Glucose, Bld: 99 mg/dL (ref 70–99)
Potassium: 5.6 mmol/L — ABNORMAL HIGH (ref 3.5–5.1)
Sodium: 139 mmol/L (ref 135–145)
Total Bilirubin: 0.3 mg/dL (ref 0.0–1.2)
Total Protein: 7.2 g/dL (ref 6.5–8.1)

## 2024-08-08 LAB — CBC WITH DIFFERENTIAL/PLATELET
Abs Immature Granulocytes: 0 K/uL (ref 0.00–0.07)
Basophils Absolute: 0.1 K/uL (ref 0.0–0.1)
Basophils Relative: 1 %
Eosinophils Absolute: 0.2 K/uL (ref 0.0–0.5)
Eosinophils Relative: 6 %
HCT: 40.4 % (ref 36.0–46.0)
Hemoglobin: 13.6 g/dL (ref 12.0–15.0)
Immature Granulocytes: 0 %
Lymphocytes Relative: 31 %
Lymphs Abs: 1.2 K/uL (ref 0.7–4.0)
MCH: 34.4 pg — ABNORMAL HIGH (ref 26.0–34.0)
MCHC: 33.7 g/dL (ref 30.0–36.0)
MCV: 102.3 fL — ABNORMAL HIGH (ref 80.0–100.0)
Monocytes Absolute: 0.4 K/uL (ref 0.1–1.0)
Monocytes Relative: 9 %
Neutro Abs: 2.1 K/uL (ref 1.7–7.7)
Neutrophils Relative %: 53 %
Platelets: 198 K/uL (ref 150–400)
RBC: 3.95 MIL/uL (ref 3.87–5.11)
RDW: 12 % (ref 11.5–15.5)
WBC: 4 K/uL (ref 4.0–10.5)
nRBC: 0 % (ref 0.0–0.2)

## 2024-08-08 MED ORDER — CYCLOBENZAPRINE HCL 10 MG PO TABS
10.0000 mg | ORAL_TABLET | Freq: Two times a day (BID) | ORAL | 0 refills | Status: AC | PRN
  Filled 2024-08-08: qty 20, 10d supply, fill #0

## 2024-08-08 MED ORDER — FENTANYL CITRATE PF 50 MCG/ML IJ SOSY
50.0000 ug | PREFILLED_SYRINGE | Freq: Once | INTRAMUSCULAR | Status: AC
  Administered 2024-08-08: 50 ug via INTRAVENOUS
  Filled 2024-08-08: qty 1

## 2024-08-08 MED ORDER — LIDOCAINE 5 % EX PTCH
1.0000 | MEDICATED_PATCH | CUTANEOUS | Status: DC
Start: 2024-08-08 — End: 2024-08-08
  Administered 2024-08-08: 1 via TRANSDERMAL
  Filled 2024-08-08: qty 1

## 2024-08-08 MED ORDER — IOHEXOL 300 MG/ML  SOLN
100.0000 mL | Freq: Once | INTRAMUSCULAR | Status: AC | PRN
  Administered 2024-08-08: 100 mL via INTRAVENOUS

## 2024-08-08 MED ORDER — ACETAMINOPHEN 500 MG PO TABS
1000.0000 mg | ORAL_TABLET | Freq: Once | ORAL | Status: AC
  Administered 2024-08-08: 1000 mg via ORAL
  Filled 2024-08-08: qty 2

## 2024-08-08 MED ORDER — CYCLOBENZAPRINE HCL 5 MG PO TABS
5.0000 mg | ORAL_TABLET | Freq: Once | ORAL | Status: AC
  Administered 2024-08-08: 5 mg via ORAL
  Filled 2024-08-08: qty 1

## 2024-08-08 MED ORDER — KETOROLAC TROMETHAMINE 15 MG/ML IJ SOLN
15.0000 mg | Freq: Once | INTRAMUSCULAR | Status: AC
  Administered 2024-08-08: 15 mg via INTRAVENOUS
  Filled 2024-08-08: qty 1

## 2024-08-08 NOTE — Discharge Instructions (Addendum)
 Recommend Tylenol  and ibuprofen  for pain.  I prescribed you a muscle relaxant called Flexeril to take as needed.  This medication is sedating so please be careful with its use.  Follow-up with your primary care doctor for further pain management.  Return if symptoms worsen.  Use lidocaine  patches as well.
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
## 2024-08-29 ENCOUNTER — Encounter: Payer: Self-pay | Admitting: Gastroenterology

## 2024-08-29 ENCOUNTER — Ambulatory Visit: Admitting: Gastroenterology

## 2024-08-29 VITALS — BP 112/58 | HR 80 | Ht 64.0 in | Wt 118.2 lb

## 2024-08-29 DIAGNOSIS — K581 Irritable bowel syndrome with constipation: Secondary | ICD-10-CM

## 2024-08-29 NOTE — Progress Notes (Signed)
 Bethany [name]    [id number]    11-21-1955  Primary Care Physician:Knapp, Annabelle, MD  Referring Physician: Randol Annabelle, MD [address] [place],  [place] [address]   Chief complaint:  IBS Constipation  Discussed the use of AI scribe software for clinical note transcription with the patient, who gave verbal consent to proceed.  History of Present Illness Bethany [name] is a 68 year old female with a history of bowel obstructions who presents for management of constipation.  Constipation and bowel habits - Bowel movements occur two to three times in the morning, none later in the day - Comfortable with current bowel movement frequency - No regular bloating or feeling of fullness, but has experienced bloating a few times since last visit - No vomiting, nausea, hematochezia, or melena  Medication use and efficacy - Takes Linzess  290 mcg daily, effective with predictable bowel movement two hours after 5 AM dose - Takes Miralax , half a cap at bedtime, to maintain regular bowel movements - Insurance does not cover Linzess  while on Movantik   History of bowel obstruction and abdominal adhesions - History of two previous bowel obstructions - Presence of abdominal scar tissue  Weight changes - Recent weight gain of approximately five pounds  Colonoscopy 07-01-22 - One 5 mm polyp in the transverse colon, removed with a cold snare. Resected and retrieved.  - External and internal hemorrhoids Surgical [P], colon, transverse, polyp (1) - TUBULAR ADENOMA. - NO HIGH GRADE DYSPLASIA OR MALIGNANCY.   Colonoscopy 04/10/2016: 5 mm sessile polyp, melanosis, internal hemorrhoids   Colonoscopy 07-21-04 Normal       Outpatient Encounter Medications as of 08/29/2024  Medication Sig   acetaminophen  (TYLENOL ) 500 MG tablet Take 2 tablets (1,000 mg total) by mouth every 6 (six) hours.   bisacodyl  (DULCOLAX) 10 MG suppository Unwrap and place 1 suppository (10 mg total)  rectally daily as needed for moderate constipation.   bisacodyl  (DULCOLAX) 5 MG EC tablet Take 1 tablet (5 mg total) by mouth daily as needed for moderate constipation.   Calcium Carbonate (CALCIUM 500 PO) Take 2 tablets by mouth daily.   Cholecalciferol (VITAMIN D ) 50 MCG (2000 UT) CAPS Take 1 each by mouth daily.   Collagen-Vitamin C-Biotin (COLLAGEN PO) Take 30 mLs by mouth daily.   COLOSTRUM PO Take by mouth.   cyanocobalamin  (VITAMIN B12) 1000 MCG tablet Take 1,000 mcg by mouth daily.   cyclobenzaprine (FLEXERIL) 10 MG tablet Take 1 tablet (10 mg total) by mouth 2 (two) times daily as needed for muscle spasms.   cycloSPORINE  (RESTASIS ) 0.05 % ophthalmic emulsion Place 1 drop into both eyes in the morning, at noon, in the evening, and at bedtime.   diphenoxylate -atropine  (LOMOTIL ) 2.5-0.025 MG tablet Take 1 tablet by mouth 4 (four) times daily as needed for diarrhea or loose stools.   docusate sodium  (COLACE) 100 MG capsule Take 2 capsules (200 mg total) by mouth 2 (two) times daily for 8 days then as directed by MD   famotidine  (PEPCID ) 20 MG tablet Take 1 tablet (20 mg total) by mouth at bedtime.   gabapentin  (NEURONTIN ) 600 MG tablet Take 600-1,200 mg by mouth See admin instructions. Take 600 mg by mouth in the morning and 1200 mg at night   hydrocortisone  2.5 % cream Apply 1 Application topically 2 (two) times daily as needed (hemorrhoids).   lamoTRIgine  (LAMICTAL ) 100 MG tablet Take 100 mg by mouth 2 (two) times daily.  linaclotide  (LINZESS ) 290 MCG CAPS capsule Take 1 capsule (290 mcg total) by mouth daily before breakfast.   MAGNESIUM  CITRATE PO Take 250 mg by mouth daily.   Multiple Vitamins-Minerals (SENIOR MULTIVITAMIN PLUS PO) Take 0.5 tablets by mouth in the morning and at bedtime.   naloxegol  oxalate (MOVANTIK ) 25 MG TABS tablet Take 1 tablet (25 mg total) by mouth daily.   ondansetron  (ZOFRAN -ODT) 4 MG disintegrating tablet Take 1 tablet (4 mg total) by mouth every 6 (six)  hours as needed for nausea.   polyethylene glycol (MIRALAX ) 17 g packet Take 17 g by mouth daily.   simethicone  (MYLICON) 80 MG chewable tablet Chew 1 tablet (80 mg total) by mouth every 6 (six) hours as needed for flatulence.   simvastatin  (ZOCOR ) 20 MG tablet TAKE 1 TABLET(20 MG) BY MOUTH DAILY   tizanidine  (ZANAFLEX ) 2 MG capsule Take 1 capsule (2 mg total) by mouth 3 (three) times daily as needed for muscle spasms.   traZODone  (DESYREL ) 50 MG tablet Take 50 mg by mouth at bedtime. (Patient taking differently: Take 50 mg by mouth at bedtime as needed.)   Turmeric-Ginger 150-25 MG CHEW Chew 1 each by mouth daily.   valACYclovir  (VALTREX ) 1000 MG tablet Take by mouth as needed.   vitamin C (ASCORBIC ACID) 500 MG tablet Take 500 mg by mouth every evening.   zinc gluconate 50 MG tablet Take 50 mg by mouth every evening.   No facility-administered encounter medications on file as of 08/29/2024.    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 All Paps normal afterwards   Degenerative disc disease, lumbar 05/02/2020   Generalized anxiety disorder    Herpes genitalis    Herpes labialis    History of calcium pyrophosphate deposition disease (CPPD) 03/26/2020   History of opiate abuse, in remission    History of small bowel obstruction 04/17/2018   partial   Hyperlipidemia with target LDL less than 100 06/17/2011   IBS (irritable bowel syndrome)    Lumbago with sciatica, right side 02/10/2021   Major depressive disorder    Meibomian  gland dysfunction (MGD) of both eyes 08/02/2020   Narcotic bowel syndrome (HCC)    Osteopenia 04/2014   T score -1.7 FRAX 7.3%/0.8% stable from prior DEXA 2013   Raynaud's disease 06/17/2011   Recovering alcoholic in remission     S/P LASIK (laser assisted in situ keratomileusis) 08/02/2020   right eye   Sleep apnea    Spondylolisthesis of lumbar region 02/10/2021   Status post total right knee replacement 10/31/2019   Tubular adenoma of colon 04/21/2016    Past Surgical History:  Procedure Laterality Date   AUGMENTATION MAMMAPLASTY     BASAL CELL CARCINOMA EXCISION  12/24/2023   left superior temple shave   BREAST ENHANCEMENT SURGERY  2006   CATARACT EXTRACTION Bilateral    Jan/Feb 2024   COSMETIC SURGERY     DRUG INDUCED ENDOSCOPY N/A 12/20/2023   Procedure: DRUG INDUCED ENDOSCOPY;  Surgeon: Tobie Eldora NOVAK, MD;  Location: [facility];  Service: ENT;  Laterality: N/A;   EYE SURGERY     lasik od   GYNECOLOGIC CRYOSURGERY  1990   IMPLANTATION OF HYPOGLOSSAL NERVE STIMULATOR Right 04/28/2024   Procedure: INSERTION HYPOGLOSSAL NERVE STIMULATOR;  Surgeon: Tobie Eldora NOVAK, MD;  Location: [facility];  Service: ENT;  Laterality: Right;   LAPAROTOMY N/A 04/22/2018   Procedure: EXPLORATORY LAPAROTOMY;  Surgeon: Vanderbilt Ned, MD;  Location: [facility];  Service: General;  Laterality: N/A;   LAPAROTOMY N/A 05/14/2023   Procedure: EXPLORATORY LAPAROTOMY;  Surgeon: Vanderbilt Ned, MD;  Location: [facility];  Service: General;  Laterality: N/A;   LIGAMENT REPAIR Right 09/24/2015   Procedure: RIGHT INDEX METACARPAL PHALANGEAL RADIAL COLLATERAL LIGAMENT REPAIR;  Surgeon: Franky Curia, MD;  Location: [facility];  Service: Orthopedics;  Laterality: Right;   LYSIS OF ADHESION N/A 05/14/2023   Procedure: LYSIS OF ADHESION, REPAIR OF SMALL BOWEL SEROSAL TEAR x 3 WITH FULL-THICKNESS REPAIR x 1;  Surgeon: Vanderbilt Ned, MD;  Location: [facility];  Service: General;   Laterality: N/A;   MASS EXCISION N/A 08/04/2018   Procedure: REMOVAL OF SUTURE FROM ABDOMEN/ERAS PATHWAY;  Surgeon: Vanderbilt Ned, MD;  Location: [facility];  Service: General;  Laterality: N/A;   OOPHORECTOMY     BSO   Right knee arthoscopy Right 09/09/2017   [facility]   SHOULDER ARTHROSCOPY Right 11/2023   Dr. [facility] repair)   SHX 1 Left 02/19/2020   basel cell carcinoma   SHX1 Right 12/11/2020   basal cell carinoma,micronodular pattern   SKIN BIOPSY Right 02/28/2019   Superficial basal carcinoma    SKIN BIOPSY Left 11/24/2021   superficial basal carcinoma of the left forearm   TONSILLECTOMY AND ADENOIDECTOMY     TOTAL KNEE ARTHROPLASTY Right 04/12/2018   Procedure: RIGHT TOTAL KNEE ARTHROPLASTY;  Surgeon: Sheril Coy, MD;  Location: [facility];  Service: Orthopedics;  Laterality: Right;   TRANSFORAMINAL LUMBAR INTERBODY FUSION W/ MIS 1 LEVEL N/A 09/03/2021   Procedure: Lumbar four-five Minimally invasive transforaminal lumbar interbody fusion;  Surgeon: Dawley, Lani BROCKS, DO;  Location: [facility];  Service: Neurosurgery;  Laterality: N/A;   TUBAL LIGATION     UPPER GASTROINTESTINAL ENDOSCOPY  04/24/2005   VAGINAL HYSTERECTOMY  2005   TVH BSO  adenomyosis    Family History  Problem Relation Age of Onset   Hypertension Mother    Dementia Mother        rapid decline; concerns for Creutzfeldt-Jakob disease   Lymphoma Father 66   Cancer Father    Early death Father    Breast cancer Sister 3   Hypertension Daughter    Hypertension Daughter        resolved with weight loss   Colon cancer Neg Hx     Social History   Socioeconomic History   Marital status: Married    Spouse name: Not on file   Number of children: Not on file   Years of education: 16   Highest education level: Bachelor's degree (e.g., BA, AB, BS)  Occupational History   Occupation: Retired    Comment: librarian, academic  Tobacco Use   Smoking status: Former    Current packs/day: 0.00     Average packs/day: 1 pack/day for 40.0 years (40.0 ttl pk-yrs)    Types: Cigarettes    Start date: 03/21/1966    Quit date: 03/21/2006    Years since quitting: 18.4   Smokeless tobacco: Never   Tobacco comments:    Uses  10 nicorette lozenge daily  Vaping Use   Vaping status: Never Used  Substance and Sexual Activity   Alcohol use: Not Currently    Comment: history of alcohol abuse/dependence. Sober for past 20 years   Drug use: Not Currently    Types: Oxycodone     Comment: history of opiate abuse. very limited/careful use after surgery   Sexual activity: Yes    Birth control/protection: Surgical    Comment: HYST-1st intercourse 68 yo-More than 5 partners  Other Topics Concern   Not on file  Social History Narrative   Married. 1 dog (golden doodle puppy)   2 daughters, 1 grandson, 1 granddaughter (local).   1 daughter lives here, one in [place].   Retired librarian, academic      Reviewed 05/2024   Social Drivers of Health   Financial Resource Strain: Low Risk  (06/18/2024)   Overall Financial Resource Strain (CARDIA)    Difficulty of Paying Living Expenses: Not hard at all  Food Insecurity: No Food Insecurity (06/18/2024)   Hunger Vital Sign    Worried About Running Out of Food in the Last Year: Never true    Ran Out of Food in the Last Year: Never true  Transportation Needs: No Transportation Needs (06/18/2024)   PRAPARE - Administrator, Civil Service (Medical): No    Lack of Transportation (Non-Medical): No  Physical Activity: Sufficiently Active (06/18/2024)   Exercise Vital Sign    Days of Exercise per Week: 5 days    Minutes of Exercise per Session: 80 min  Stress: No Stress Concern Present (06/18/2024)   [employer] of Occupational Health - Occupational Stress Questionnaire    Feeling of Stress: Only a little  Social Connections: Socially Integrated (06/18/2024)   Social Connection and Isolation Panel    Frequency of Communication with Friends and  Family: More than three times a week    Frequency of Social Gatherings with Friends and Family: Three times a week    Attends Religious Services: More than 4 times per year    Active Member of Clubs or Organizations: Yes    Attends Banker Meetings: More than 4 times per year    Marital Status: Married  Catering Manager Violence: Not At Risk (05/17/2023)   Humiliation, Afraid, Rape, and Kick questionnaire    Fear of Current or Ex-Partner: No    Emotionally Abused: No    Physically Abused: No    Sexually Abused: No      Review of systems: All other review of systems negative except as mentioned in the HPI.   Physical Exam: Vitals:   08/29/24 1337  BP: (!) 112/58  Pulse: 80   Body mass index is 20.3 kg/m. Gen:      No acute distress HEENT:  sclera anicteric Abd:      soft, non-tender; no palpable masses, no distension Ext:    No edema Neuro: alert and oriented x 3 Psych: normal mood and affect  Data Reviewed:  Reviewed labs, radiology imaging, old records and pertinent past GI work up   Assessment and Plan Assessment & Plan 18 yr F with h/o SBO secondary to adhesions s/p ex lap with lysis of adhesions and distal ileum resection 04/2018 with recurrent SBO secondary to adhesions, last episode in July 2024 with recurrent small bowel obstruction s/p ex lap with small bowel resection  IBS constipation and abdominal bloating  Chronic constipation Chronic constipation managed with Movantik  at bedtime and Linzess  290 micrograms daily.  Insurance issues, denying coverage for Linzess  and Movantik .  She is currently using samples along with Movantik , she wants to try only Linzess  without Movantik  going forward. Currently having 2-3 bowel movements in the morning, which is acceptable. No bloating, nausea, vomiting, blood, or melena reported. Insurance will not cover both medications. The combination of Movantik  and Linzess  was effective, but due to insurance constraints,  Linzess  will be continued alone. - Discontinue Movantik . - Continue Linzess  290 micrograms daily, preferably taken first thing in the morning. - Provide samples of Linzess  to bridge until prescription is filled. - If constipation worsens, increase Miralax  dosage at bedtime to ensure 2-3 bowel movements daily.  Bowel obstruction with abdominal adhesions Bowel obstruction with abdominal adhesions. No current symptoms of bowel obstruction.  - Monitor for any changes in bowel habits or symptoms indicative of bowel obstruction  Due for surveillance colonoscopy March 2030, had a diminutive adenoma removed in 2023  Return in 1 year or sooner if needed  The patient was provided an opportunity to ask questions and all were answered. The patient agreed with the plan and demonstrated an understanding of the instructions.  LOIS Wilkie Mcgee , MD    CC: Randol Dawes, MD

## 2024-08-29 NOTE — Patient Instructions (Addendum)
 VISIT SUMMARY:  Today, we discussed the management of your chronic constipation and reviewed your history of bowel obstructions. You are currently comfortable with your bowel movement frequency and have no significant symptoms of concern.  YOUR PLAN:  CHRONIC CONSTIPATION: You have chronic constipation that is currently managed with Linzess . You are having 2-3 bowel movements in the morning, which is acceptable. -Discontinue Movantik  due to insurance issues. -Continue taking Linzess  290 micrograms daily, preferably first thing in the morning. -We will provide samples of Linzess  to bridge until your prescription is filled. -If your constipation worsens, increase the dosage of Miralax  at bedtime to ensure 2-3 bowel movements daily.  BOWEL OBSTRUCTION WITH ABDOMINAL ADHESIONS: You have a history of bowel obstructions and abdominal adhesions but no current symptoms. -Monitor for any changes in bowel habits or symptoms indicative of bowel obstruction.  I appreciate the  opportunity to care for you  Thank You   Kavitha Nandigam , MD

## 2024-08-30 ENCOUNTER — Encounter: Payer: Self-pay | Admitting: Gastroenterology

## 2024-08-30 DIAGNOSIS — L821 Other seborrheic keratosis: Secondary | ICD-10-CM | POA: Diagnosis not present

## 2024-08-30 DIAGNOSIS — L603 Nail dystrophy: Secondary | ICD-10-CM | POA: Diagnosis not present

## 2024-08-30 DIAGNOSIS — F411 Generalized anxiety disorder: Secondary | ICD-10-CM | POA: Diagnosis not present

## 2024-08-30 DIAGNOSIS — L812 Freckles: Secondary | ICD-10-CM | POA: Diagnosis not present

## 2024-08-30 DIAGNOSIS — L570 Actinic keratosis: Secondary | ICD-10-CM | POA: Diagnosis not present

## 2024-08-30 DIAGNOSIS — Z85828 Personal history of other malignant neoplasm of skin: Secondary | ICD-10-CM | POA: Diagnosis not present

## 2024-09-01 ENCOUNTER — Ambulatory Visit: Admitting: Family Medicine

## 2024-09-16 ENCOUNTER — Encounter: Payer: Self-pay | Admitting: Gastroenterology

## 2024-09-18 NOTE — Progress Notes (Deleted)
 [facility] [address] [place] [address] Phone: [phone] Subjective:    I'm seeing this patient by the request  of:  Randol Dawes, MD  CC:   YEP:Dlagzrupcz  Bethany [name] is a 68 y.o. female coming in with complaint of back and neck pain. OMT 06/22/2024. Patient states   Medications patient has been prescribed: None  Taking:         Reviewed prior external information including notes and imaging from previsou exam, outside providers and external EMR if available.   As well as notes that were available from care everywhere and other healthcare systems.  Past medical history, social, surgical and family history all reviewed in electronic medical record.  No pertanent information unless stated regarding to the chief complaint.   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 All Paps normal afterwards   Degenerative disc disease, lumbar 05/02/2020   Generalized anxiety disorder    Herpes genitalis    Herpes labialis    History of calcium pyrophosphate deposition disease (CPPD) 03/26/2020   History of opiate abuse, in remission    History of small bowel obstruction 04/17/2018   partial   Hyperlipidemia with target LDL less than 100 06/17/2011   IBS (irritable bowel syndrome)    Lumbago with sciatica, right side 02/10/2021   Major depressive disorder    Meibomian gland dysfunction (MGD) of both eyes 08/02/2020   Narcotic bowel syndrome (HCC)    Osteopenia 04/2014   T score -1.7 FRAX 7.3%/0.8% stable from prior DEXA 2013   Raynaud's disease 06/17/2011   Recovering alcoholic in remission     S/P LASIK (laser assisted in situ keratomileusis) 08/02/2020    right eye   Sleep apnea    Spondylolisthesis of lumbar region 02/10/2021   Status post total right knee replacement 10/31/2019   Tubular adenoma of colon 04/21/2016    Allergies  Allergen Reactions   Buprenorphine Hcl Nausea And Vomiting   Morphine  And Codeine Nausea Only     Review of Systems:  No headache, visual changes, nausea, vomiting, diarrhea, constipation, dizziness, abdominal pain, skin rash, fevers, chills, night sweats, weight loss, swollen lymph nodes, body aches, joint swelling, chest pain, shortness of breath, mood changes. POSITIVE muscle aches  Objective  There were no vitals taken for this visit.   General: No apparent distress alert and oriented x3 mood and affect normal, dressed appropriately.  HEENT: Pupils equal, extraocular movements intact  Respiratory: Patient's speak in full sentences and does not appear short of breath  Cardiovascular: No lower extremity edema, non tender, no erythema  Gait MSK:  Back   Osteopathic findings  C2 flexed rotated and side bent right C6 flexed rotated and side bent left T3 extended rotated and side bent right inhaled rib T9 extended rotated and side bent left L2 flexed rotated and side bent right Sacrum right on right       Assessment and Plan:  No problem-specific Assessment & Plan notes found for this encounter.    Nonallopathic problems  Decision today to treat with OMT was based on Physical Exam  After verbal consent patient was treated with HVLA, ME, FPR techniques in cervical, rib, thoracic, lumbar, and sacral  areas  Patient tolerated the procedure well with improvement in symptoms  Patient given exercises, stretches and lifestyle modifications  See medications in patient instructions if given  Patient will follow up in 4-8 weeks             Note: This dictation was prepared with Dragon dictation along with smaller phrase technology. Any transcriptional errors that result from this  process are unintentional.

## 2024-09-19 ENCOUNTER — Telehealth: Payer: Self-pay

## 2024-09-19 ENCOUNTER — Other Ambulatory Visit (HOSPITAL_COMMUNITY): Payer: Self-pay

## 2024-09-19 ENCOUNTER — Telehealth: Payer: Self-pay | Admitting: Gastroenterology

## 2024-09-19 DIAGNOSIS — G4733 Obstructive sleep apnea (adult) (pediatric): Secondary | ICD-10-CM | POA: Diagnosis not present

## 2024-09-19 NOTE — Telephone Encounter (Signed)
 Noted pt. aware

## 2024-09-19 NOTE — Telephone Encounter (Signed)
 Inbound call from patient requesting to speak with nurse Beth. She states she has been messaging with Landry and is a little confused. Please advise.

## 2024-09-19 NOTE — Telephone Encounter (Signed)
 Pharmacy Patient Advocate Encounter   Received notification from CoverMyMeds that prior authorization for Linzess  capsules is required/requested.   Insurance verification completed.   The patient is insured through [facility].   Prior Authorization for Linzess  capsules has been APPROVED from 09-19-2024 to 09-19-2025   PA #/Case ID/Reference #: [id number]

## 2024-09-20 ENCOUNTER — Ambulatory Visit: Admitting: Family Medicine

## 2024-09-20 NOTE — Telephone Encounter (Signed)
 Linzess  is approved. This will automatically cancel the previous prior authorization for Movantik  because the insurance will only allow one drug in this category.   Called the patient to discuss. No answer. Left a message of my call.

## 2024-09-20 NOTE — Telephone Encounter (Signed)
 Patient returning call Requesting a call back  Please advise  Thank you

## 2024-09-21 NOTE — Telephone Encounter (Signed)
 Patient contacted. See patient message 09/16/24.

## 2024-10-09 ENCOUNTER — Ambulatory Visit

## 2024-10-09 DIAGNOSIS — Z23 Encounter for immunization: Secondary | ICD-10-CM | POA: Diagnosis not present

## 2024-10-10 DIAGNOSIS — F411 Generalized anxiety disorder: Secondary | ICD-10-CM | POA: Diagnosis not present

## 2024-10-24 MED ORDER — LINACLOTIDE 290 MCG PO CAPS: 290.0000 ug | ORAL_CAPSULE | Freq: Every day | ORAL | 6 refills | Status: AC

## 2024-10-24 NOTE — Addendum Note (Signed)
 Addended by: ANITRA ODETTA CROME on: 10/24/2024 08:26 AM   Modules accepted: Orders

## 2024-11-07 NOTE — Progress Notes (Signed)
 " [name] [address] [place] [address] Phone: [phone] Subjective:   Bethany [name] am a scribe for Dr. Claudene.   I'm seeing this patient by the request  of:  Randol Dawes, MD  CC: Back and neck pain follow-up  YEP:Dlagzrupcz  Bethany [name] is a 69 y.o. female coming in with complaint of back and neck pain. OMT on 06/22/2024. Patient states that her back and neck are much better today. Left thumb joint is messing up today.   Medications patient has been prescribed:   Taking:         Reviewed prior external information including notes and imaging from previsou exam, outside providers and external EMR if available.   As well as notes that were available from care everywhere and other healthcare systems.  Past medical history, social, surgical and family history all reviewed in electronic medical record.  No pertanent information unless stated regarding to the chief complaint.   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 All Paps normal afterwards   Degenerative disc disease, lumbar 05/02/2020   Generalized anxiety disorder    Herpes genitalis    Herpes labialis    History of calcium pyrophosphate deposition disease (CPPD) 03/26/2020   History of opiate abuse, in remission    History of small bowel obstruction 04/17/2018   partial   Hyperlipidemia with target LDL less than 100 06/17/2011   IBS (irritable bowel syndrome)    Lumbago with sciatica, right side 02/10/2021   Major depressive disorder    Meibomian gland dysfunction (MGD) of both eyes 08/02/2020   Narcotic bowel syndrome (HCC)    Osteopenia 04/2014   T score -1.7 FRAX 7.3%/0.8% stable  from prior DEXA 2013   Raynaud's disease 06/17/2011   Recovering alcoholic in remission     S/P LASIK (laser assisted in situ keratomileusis) 08/02/2020   right eye   Sleep apnea    Spondylolisthesis of lumbar region 02/10/2021   Status post total right knee replacement 10/31/2019   Tubular adenoma of colon 04/21/2016    Allergies[1]   Review of Systems:  No headache, visual changes, nausea, vomiting, diarrhea, constipation, dizziness, abdominal pain, skin rash, fevers, chills, night sweats, weight loss, swollen lymph nodes, body aches, joint swelling, chest pain, shortness of breath, mood changes. POSITIVE muscle aches  Objective  Blood pressure 102/60, pulse 62, height 5' 4 (1.626 m), weight 116 lb 12.8 oz (53 kg), SpO2 98%.   General: No apparent distress alert and oriented x3 mood and affect normal, dressed appropriately.  HEENT: Pupils equal, extraocular movements intact  Respiratory: Patient's speak in full sentences and does not appear short of breath  Cardiovascular: No lower extremity edema, non tender, no erythema  Gait MSK:  Back does have some loss lordosis noted.  Some tenderness to palpation of the paraspinal musculature.  Patient does have some tightness noted right greater than left.  Neck exam does have some limited sidebending. Left thumb does have some swelling noted.  Positive CMC grind test noted.  Mild limitation in range of motion.  Osteopathic findings  C7 flexed rotated and side bent left T3 extended rotated and side bent right inhaled rib T9 extended rotated and side  bent left L1 flexed rotated and side bent right L3 flexed rotated and side bent left Sacrum right on right  Procedure: Real-time Ultrasound Guided Injection of left CMC joint Device: GE Logiq Q7 Ultrasound guided injection is preferred based studies that show increased duration, increased effect, greater accuracy, decreased procedural pain, increased response rate, and decreased cost with  ultrasound guided versus blind injection.  Verbal informed consent obtained.  Time-out conducted.  Noted no overlying erythema, induration, or other signs of local infection.  Skin prepped in a sterile fashion.  Local anesthesia: Topical Ethyl chloride.  With sterile technique and under real time ultrasound guidance: With a 25-gauge half inch needle injected with 0.5 cc of 0.5% Marcaine  and 0.5 cc of Kenalog  40 mg/mL Completed without difficulty  Pain immediately resolved suggesting accurate placement of the medication.  Advised to call if fevers/chills, erythema, induration, drainage, or persistent bleeding.  Images saved Impression: Technically successful ultrasound guided injection.   Assessment and Plan:  Arthritis of carpometacarpal [facility]) joint of left thumb Chronic, with exacerbation, and has been over 3 years since we have done any injection previously.  Hopeful that this will make significant improvement again.  Discussed with patient about icing regimen, wearing a brace on the hand would be more beneficial as well.  Discussed icing regimen.  Follow-up again in 6 to 12 weeks  Degenerative disc disease, lumbar Chronic problem noted.  Discussed icing regimen and home exercises, discussed which activities to do and which ones to avoid.  Increase activity slowly.  Discussed icing regimen.  Follow-up again in 6 to 8 weeks    Nonallopathic problems  Decision today to treat with OMT was based on Physical Exam  After verbal consent patient was treated with  ME, FPR techniques in cervical, rib, thoracic, lumbar, and sacral  areas  Patient tolerated the procedure well with improvement in symptoms  Patient given exercises, stretches and lifestyle modifications  See medications in patient instructions if given  Patient will follow up in 4-8 weeks     The above documentation has been reviewed and is accurate and complete Bethany Mihelich M Hiliana Eilts, DO         Note: This dictation was  prepared with Dragon dictation along with smaller phrase technology. Any transcriptional errors that result from this process are unintentional.            [1]  Allergies Allergen Reactions   Buprenorphine Hcl Nausea And Vomiting   Morphine  And Codeine Nausea Only   "

## 2024-11-10 ENCOUNTER — Other Ambulatory Visit: Payer: Self-pay

## 2024-11-10 ENCOUNTER — Encounter: Payer: Self-pay | Admitting: Family Medicine

## 2024-11-10 ENCOUNTER — Ambulatory Visit: Admitting: Family Medicine

## 2024-11-10 VITALS — BP 102/60 | HR 62 | Ht 64.0 in | Wt 116.8 lb

## 2024-11-10 DIAGNOSIS — M9901 Segmental and somatic dysfunction of cervical region: Secondary | ICD-10-CM

## 2024-11-10 DIAGNOSIS — M9902 Segmental and somatic dysfunction of thoracic region: Secondary | ICD-10-CM | POA: Diagnosis not present

## 2024-11-10 DIAGNOSIS — M1812 Unilateral primary osteoarthritis of first carpometacarpal joint, left hand: Secondary | ICD-10-CM

## 2024-11-10 DIAGNOSIS — M51362 Other intervertebral disc degeneration, lumbar region with discogenic back pain and lower extremity pain: Secondary | ICD-10-CM | POA: Diagnosis not present

## 2024-11-10 DIAGNOSIS — M9908 Segmental and somatic dysfunction of rib cage: Secondary | ICD-10-CM

## 2024-11-10 DIAGNOSIS — M9904 Segmental and somatic dysfunction of sacral region: Secondary | ICD-10-CM

## 2024-11-10 DIAGNOSIS — M79645 Pain in left finger(s): Secondary | ICD-10-CM | POA: Diagnosis not present

## 2024-11-10 DIAGNOSIS — M9903 Segmental and somatic dysfunction of lumbar region: Secondary | ICD-10-CM

## 2024-11-10 DIAGNOSIS — G8929 Other chronic pain: Secondary | ICD-10-CM | POA: Diagnosis not present

## 2024-11-10 NOTE — Patient Instructions (Addendum)
 Good to see you. Injected L thumb See me again in 2 months

## 2024-11-10 NOTE — Assessment & Plan Note (Signed)
 Chronic, with exacerbation, and has been over 3 years since we have done any injection previously.  Hopeful that this will make significant improvement again.  Discussed with patient about icing regimen, wearing a brace on the hand would be more beneficial as well.  Discussed icing regimen.  Follow-up again in 6 to 12 weeks

## 2024-11-10 NOTE — Assessment & Plan Note (Signed)
 Chronic problem noted.  Discussed icing regimen and home exercises, discussed which activities to do and which ones to avoid.  Increase activity slowly.  Discussed icing regimen.  Follow-up again in 6 to 8 weeks

## 2024-11-21 LAB — HM MAMMOGRAPHY

## 2024-11-22 NOTE — Progress Notes (Unsigned)
No chief complaint on file.      PMH, PSH, SH reviewed   ROS:    PHYSICAL EXAM:  There were no vitals taken for this visit.      ASSESSMENT/PLAN:

## 2024-11-23 ENCOUNTER — Ambulatory Visit (INDEPENDENT_AMBULATORY_CARE_PROVIDER_SITE_OTHER): Admitting: Otolaryngology

## 2024-11-23 ENCOUNTER — Encounter: Payer: Self-pay | Admitting: Family Medicine

## 2024-11-23 ENCOUNTER — Ambulatory Visit: Admitting: Family Medicine

## 2024-11-23 ENCOUNTER — Ambulatory Visit (INDEPENDENT_AMBULATORY_CARE_PROVIDER_SITE_OTHER): Admitting: Family Medicine

## 2024-11-23 VITALS — BP 120/64 | HR 64 | Temp 97.1°F | Ht 64.0 in | Wt 116.6 lb

## 2024-11-23 DIAGNOSIS — H6123 Impacted cerumen, bilateral: Secondary | ICD-10-CM | POA: Diagnosis not present

## 2024-11-23 DIAGNOSIS — R92333 Mammographic heterogeneous density, bilateral breasts: Secondary | ICD-10-CM

## 2024-11-23 NOTE — Patient Instructions (Signed)
 Try using Debrox drops to the left ear to soften the cerumen. If you have any symptoms on the left side (crackling, discomfort or decreased hearing) return for re-evaluation and possible ear lavage (flushing).  We removed all of the debris and wax that was present in the right ear, and you did not have any symptoms for the remainder of the visit. Hopefully removing that solved the problem. If you have recurrent symptoms later today, there may be a component of eustachian tube dysfunction, and you can consider trying Flonase (since you don't tolerate the use of decongestants).  Since you report worsening of your hearing (and tinnitus), you should consider getting another hearing evaluation. You can check to see if Dr. Anthony office does this. If not, you can get this done from [facility], or [facility] or [facility], or others.

## 2025-01-10 ENCOUNTER — Ambulatory Visit: Admitting: Family Medicine

## 2025-07-18 ENCOUNTER — Ambulatory Visit: Payer: Self-pay | Admitting: Family Medicine

## 2025-08-01 ENCOUNTER — Encounter: Admitting: Family Medicine

## 2025-08-08 ENCOUNTER — Encounter: Payer: Self-pay | Admitting: Family Medicine
